# Patient Record
Sex: Female | Born: 1960 | Race: White | Hispanic: No | Marital: Married | Smoking: Former smoker
Health system: Southern US, Community
[De-identification: ages and names within clinical notes are randomized; demographics above are authoritative.]

## PROBLEM LIST (undated history)

## (undated) ENCOUNTER — Emergency Department (HOSPITAL_COMMUNITY): Payer: 59

## (undated) DIAGNOSIS — S93402A Sprain of unspecified ligament of left ankle, initial encounter: Secondary | ICD-10-CM

## (undated) DIAGNOSIS — K635 Polyp of colon: Secondary | ICD-10-CM

## (undated) DIAGNOSIS — R569 Unspecified convulsions: Secondary | ICD-10-CM

## (undated) DIAGNOSIS — C719 Malignant neoplasm of brain, unspecified: Secondary | ICD-10-CM

## (undated) HISTORY — PX: WISDOM TOOTH EXTRACTION: SHX21

## (undated) HISTORY — DX: Malignant neoplasm of brain, unspecified: C71.9

## (undated) HISTORY — PX: TONSILECTOMY/ADENOIDECTOMY WITH MYRINGOTOMY: SHX6125

## (undated) HISTORY — DX: Polyp of colon: K63.5

## (undated) HISTORY — DX: Sprain of unspecified ligament of left ankle, initial encounter: S93.402A

## (undated) HISTORY — PX: FOOT SURGERY: SHX648

## (undated) NOTE — *Deleted (*Deleted)
Has armband been applied?    Does patient have an allergy to IV contrast dye?: No   Has patient ever received premedication for IV contrast dye?:  n/a  Does patient take metformin?: No  If patient does take metformin when was the last dose: n/a  Date of lab work: 08/02/2020 BUN: 11 CR: 0.79 eGFR: >60  IV site:   Has IV site been added to flowsheet?    LMP 06/30/2015

---

## 1990-11-04 LAB — HM MAMMOGRAPHY

## 1998-09-12 ENCOUNTER — Other Ambulatory Visit: Admission: RE | Admit: 1998-09-12 | Discharge: 1998-09-12 | Payer: Self-pay | Admitting: Obstetrics and Gynecology

## 1999-10-05 ENCOUNTER — Other Ambulatory Visit: Admission: RE | Admit: 1999-10-05 | Discharge: 1999-10-05 | Payer: Self-pay | Admitting: *Deleted

## 2000-09-29 ENCOUNTER — Other Ambulatory Visit: Admission: RE | Admit: 2000-09-29 | Discharge: 2000-09-29 | Payer: Self-pay | Admitting: Obstetrics and Gynecology

## 2003-07-26 ENCOUNTER — Other Ambulatory Visit: Admission: RE | Admit: 2003-07-26 | Discharge: 2003-07-26 | Payer: Self-pay | Admitting: Obstetrics and Gynecology

## 2006-10-13 ENCOUNTER — Ambulatory Visit: Payer: Self-pay | Admitting: Internal Medicine

## 2006-10-13 LAB — CONVERTED CEMR LAB
ALT: 11 units/L (ref 0–40)
AST: 19 units/L (ref 0–37)
Albumin: 3.9 g/dL (ref 3.5–5.2)
Alkaline Phosphatase: 35 units/L — ABNORMAL LOW (ref 39–117)
BUN: 10 mg/dL (ref 6–23)
Basophils Absolute: 0 10*3/uL (ref 0.0–0.1)
Basophils Relative: 0.3 % (ref 0.0–1.0)
CO2: 30 meq/L (ref 19–32)
Calcium: 9.2 mg/dL (ref 8.4–10.5)
Chloride: 105 meq/L (ref 96–112)
Chol/HDL Ratio, serum: 2.4
Cholesterol: 170 mg/dL (ref 0–200)
Creatinine, Ser: 0.8 mg/dL (ref 0.4–1.2)
Eosinophil percent: 1 % (ref 0.0–5.0)
GFR calc non Af Amer: 82 mL/min
Glomerular Filtration Rate, Af Am: 100 mL/min/{1.73_m2}
Glucose, Bld: 85 mg/dL (ref 70–99)
HCT: 40.9 % (ref 36.0–46.0)
HDL: 72.1 mg/dL (ref >39.0)
Hemoglobin: 13.7 g/dL (ref 12.0–15.0)
LDL Cholesterol: 89 mg/dL (ref 0–99)
Lymphocytes Relative: 27.7 % (ref 12.0–46.0)
MCHC: 33.5 g/dL (ref 30.0–36.0)
MCV: 95.2 fL (ref 78.0–100.0)
Monocytes Absolute: 0.5 10*3/uL (ref 0.2–0.7)
Monocytes Relative: 10.4 % (ref 3.0–11.0)
Neutro Abs: 3.1 10*3/uL (ref 1.4–7.7)
Neutrophils Relative %: 60.6 % (ref 43.0–77.0)
Platelets: 212 10*3/uL (ref 150–400)
Potassium: 4 meq/L (ref 3.5–5.1)
RBC: 4.29 M/uL (ref 3.87–5.11)
RDW: 12.1 % (ref 11.5–14.6)
Sodium: 140 meq/L (ref 135–145)
TSH: 1.03 microintl units/mL (ref 0.35–5.50)
Total Bilirubin: 1.1 mg/dL (ref 0.3–1.2)
Total Protein: 6.6 g/dL (ref 6.0–8.3)
Triglyceride fasting, serum: 44 mg/dL (ref 0–149)
VLDL: 9 mg/dL (ref 0–40)
WBC: 5 10*3/uL (ref 4.5–10.5)

## 2011-10-07 ENCOUNTER — Ambulatory Visit (INDEPENDENT_AMBULATORY_CARE_PROVIDER_SITE_OTHER): Payer: 59 | Admitting: Family Medicine

## 2011-10-07 ENCOUNTER — Encounter: Payer: Self-pay | Admitting: Family Medicine

## 2011-10-07 ENCOUNTER — Ambulatory Visit (HOSPITAL_BASED_OUTPATIENT_CLINIC_OR_DEPARTMENT_OTHER)
Admission: RE | Admit: 2011-10-07 | Discharge: 2011-10-07 | Disposition: A | Discharge status: Home / Self Care | Payer: 59 | Source: Ambulatory Visit | Attending: Family Medicine | Admitting: Family Medicine

## 2011-10-07 VITALS — BP 166/91 | HR 61 | Temp 98.0°F | Ht 60.0 in | Wt 145.1 lb

## 2011-10-07 DIAGNOSIS — M25579 Pain in unspecified ankle and joints of unspecified foot: Secondary | ICD-10-CM

## 2011-10-07 DIAGNOSIS — R609 Edema, unspecified: Secondary | ICD-10-CM | POA: Insufficient documentation

## 2011-10-07 DIAGNOSIS — S93402A Sprain of unspecified ligament of left ankle, initial encounter: Secondary | ICD-10-CM | POA: Insufficient documentation

## 2011-10-07 DIAGNOSIS — M25572 Pain in left ankle and joints of left foot: Secondary | ICD-10-CM

## 2011-10-07 DIAGNOSIS — M7989 Other specified soft tissue disorders: Secondary | ICD-10-CM

## 2011-10-07 DIAGNOSIS — S93409A Sprain of unspecified ligament of unspecified ankle, initial encounter: Secondary | ICD-10-CM

## 2011-10-07 DIAGNOSIS — X500XXA Overexertion from strenuous movement or load, initial encounter: Secondary | ICD-10-CM

## 2011-10-07 HISTORY — DX: Sprain of unspecified ligament of left ankle, initial encounter: S93.402A

## 2011-10-07 MED ORDER — MISC. DEVICES MISC: Status: DC

## 2011-10-07 NOTE — Assessment & Plan Note (Signed)
Pain and swelling noted over lateral and medial malleolus to the base of the foot. Point tenderness noted at the base of the fibula as well as over the medial malleolus. Neurovascular flow is intact to the foot. We will place her in a mobility boot she will elevate her foot above her heart for 2 minutes a time 3 times a day started on anti-inflammatories and wrapped in an Ace wrap. If symptoms do not improve her x-rays suggest the possibility of a small avulsion fracture at the base of the fibula and she may need further referral. At this time she would prefer to proceed with conservative measures and to proceed with ortho only if symptoms do not improve

## 2011-10-07 NOTE — Progress Notes (Signed)
Hannah [name] [id number] 05-23-1961 10/07/2011      Progress Note-Follow Up  Subjective  Chief Complaint  Chief Complaint  Patient presents with  . Ankle Injury    X 2 days- left ankle swollen- while running    HPI  This is a 50 year old Caucasian female who is in today with a 24-hour history of left ankle pain and swelling. She has not previously seen in this practice but has no significant past medical history. She was running yesterday when she suffered an inversion injury of her left ankle resulting in a fall. She denies any history of similar injury. She's been icing the foot and did take one dose of aspirin but continues to have swelling and ecchymosis. Pain is worse over the medial aspect of the foot and worse with weightbearing. No numbness or tingling noted into the foot. No calf pain no chest pain, shortness of breath or other signs of acute illness.  Past Medical History  Diagnosis Date  . Left ankle sprain 10/07/2011    History reviewed. No pertinent past surgical history.  History reviewed. No pertinent family history.  History   Social History  . Marital Status: Married    Spouse Name: N/A    Number of Children: N/A  . Years of Education: N/A   Occupational History  . Not on file.   Social History Main Topics  . Smoking status: Former Smoker -- 2 years    Types: Cigarettes    Quit date: 11/05/1983  . Smokeless tobacco: Never Used   Comment: 1/2 pack a week  . Alcohol Use: Yes     1 glass of wine weekly  . Drug Use: No  . Sexually Active: Not on file   Other Topics Concern  . Not on file   Social History Narrative  . No narrative on file    No current outpatient prescriptions on file prior to visit.    No Known Allergies  Review of Systems  Review of Systems  Constitutional: Negative for fever and malaise/fatigue.  HENT: Negative for congestion.   Eyes: Negative for discharge.  Respiratory: Negative for shortness of breath.     Cardiovascular: Negative for chest pain, palpitations and leg swelling.  Gastrointestinal: Negative for nausea, abdominal pain and diarrhea.  Genitourinary: Negative for dysuria.  Musculoskeletal: Positive for joint pain and falls.       Left ankle and left foot pain and swelling s/p an inversion injury while running yesterday  Skin: Negative for rash.  Neurological: Negative for loss of consciousness and headaches.  Endo/Heme/Allergies: Negative for polydipsia.  Psychiatric/Behavioral: Negative for depression and suicidal ideas. The patient is not nervous/anxious and does not have insomnia.     Objective  BP 166/91  Pulse 61  Temp(Src) 98 F (36.7 C) (Oral)  Ht 5' (1.524 m)  Wt 145 lb 1.9 oz (65.826 kg)  BMI 28.34 kg/m2  SpO2 99%  LMP 09/14/2011  Physical Exam  Physical Exam  Constitutional: She is oriented to person, place, and time and well-developed, well-nourished, and in no distress. No distress.  HENT:  Head: Normocephalic and atraumatic.  Eyes: Conjunctivae are normal.  Neck: Neck supple. No thyromegaly present.  Cardiovascular: Normal rate, regular rhythm and normal heart sounds.   No murmur heard. Pulmonary/Chest: Effort normal and breath sounds normal. She has no wheezes.  Abdominal: She exhibits no distension and no mass.  Musculoskeletal: She exhibits edema and tenderness.       Point tender over the lateral aspect  of distal fibula as well as over medial malleolus. Decreased range of motion secondary to swelling and pain. Swelling and ecchymosis noted over base of foot top and bottom as well as over the medial and lateral malleolus. Medial is less swollen and lateral. Good blood flow to the foot and sensation is intact in the toes.  Lymphadenopathy:    She has no cervical adenopathy.  Neurological: She is alert and oriented to person, place, and time.  Skin: Skin is warm and dry. No rash noted. She is not diaphoretic.  Psychiatric: Memory, affect and judgment  normal.    Lab Results  Component Value Date   TSH 1.03 10/13/2006   Lab Results  Component Value Date   WBC 5.0 10/13/2006   HGB 13.7 10/13/2006   HCT 40.9 10/13/2006   MCV 95.2 10/13/2006   PLT 212 10/13/2006   Lab Results  Component Value Date   CREATININE 0.8 10/13/2006   BUN 10 10/13/2006   NA 140 10/13/2006   K 4.0 10/13/2006   CL 105 10/13/2006   CO2 30 10/13/2006   Lab Results  Component Value Date   ALT 11 10/13/2006   AST 19 10/13/2006   ALKPHOS 35* 10/13/2006   BILITOT 1.1 10/13/2006   Lab Results  Component Value Date   CHOL 170 10/13/2006   Lab Results  Component Value Date   HDL 72.1 10/13/2006   Lab Results  Component Value Date   LDLCALC 89 10/13/2006     Assessment & Plan  Left ankle sprain Pain and swelling noted over lateral and medial malleolus to the base of the foot. Point tenderness noted at the base of the fibula as well as over the medial malleolus. Neurovascular flow is intact to the foot. We will place her in a mobility boot she will elevate her foot above her heart for 2 minutes a time 3 times a day started on anti-inflammatories and wrapped in an Ace wrap. If symptoms do not improve her x-rays suggest the possibility of a small avulsion fracture at the base of the fibula and she may need further referral. At this time she would prefer to proceed with conservative measures and to proceed with ortho only if symptoms do not improve

## 2012-12-02 ENCOUNTER — Other Ambulatory Visit: Payer: Self-pay | Admitting: Family Medicine

## 2012-12-02 ENCOUNTER — Telehealth: Payer: Self-pay

## 2012-12-02 NOTE — Telephone Encounter (Signed)
Patient fell in her driveway last night and twisted her left knee. Pt would like to have a knee brace.   Per MD apply knee brace.

## 2012-12-02 NOTE — Telephone Encounter (Signed)
Placed brace on patient, warned to consider further work up if does not improve, ice for 10 minutes as often as tolerated

## 2013-01-26 ENCOUNTER — Ambulatory Visit (INDEPENDENT_AMBULATORY_CARE_PROVIDER_SITE_OTHER): Payer: 59 | Admitting: Family Medicine

## 2013-01-26 DIAGNOSIS — L989 Disorder of the skin and subcutaneous tissue, unspecified: Secondary | ICD-10-CM

## 2013-01-27 NOTE — Progress Notes (Signed)
S.  52 y/o WF presents requesting removal of skin lesion on left side of face. Lesion is new in the last few months, is crusty but with distinct circular shape and well demarcated borders, flesh colored. She denies hx of skin cancer.  O. Well appearing.  Skin on left side of face shows a light brown, circular papule with slightly flaky texture and "stuck-on" appearance.  A.  Facial skin lesion, benign-appearing. Discussed removal options with pt and decided on shave excision. After injecting 1/4 cc of 2% lidocaine w/out epi into the area of the lesion for local anesthesia, I used a flexible shave blade to excise the lesion down to the surface of the skin.  Minimal bleeding, hemostasis obtained with gentle pressure.  Dressing applied.    No immediate complications.  Pt tolerated procedure well.  Specimen sent to pathology. F/u prn.

## 2013-03-10 ENCOUNTER — Encounter: Payer: Self-pay | Admitting: Nurse Practitioner

## 2013-03-10 ENCOUNTER — Ambulatory Visit (INDEPENDENT_AMBULATORY_CARE_PROVIDER_SITE_OTHER): Payer: 59 | Admitting: Nurse Practitioner

## 2013-03-10 ENCOUNTER — Ambulatory Visit (HOSPITAL_BASED_OUTPATIENT_CLINIC_OR_DEPARTMENT_OTHER)
Admission: RE | Admit: 2013-03-10 | Discharge: 2013-03-10 | Disposition: A | Discharge status: Home / Self Care | Payer: 59 | Source: Ambulatory Visit | Attending: Nurse Practitioner | Admitting: Nurse Practitioner

## 2013-03-10 VITALS — BP 110/70 | HR 56 | Temp 97.6°F | Ht 65.0 in | Wt 144.2 lb

## 2013-03-10 DIAGNOSIS — W010XXA Fall on same level from slipping, tripping and stumbling without subsequent striking against object, initial encounter: Secondary | ICD-10-CM

## 2013-03-10 DIAGNOSIS — R079 Chest pain, unspecified: Secondary | ICD-10-CM

## 2013-03-10 DIAGNOSIS — W19XXXA Unspecified fall, initial encounter: Secondary | ICD-10-CM | POA: Insufficient documentation

## 2013-03-10 DIAGNOSIS — R0781 Pleurodynia: Secondary | ICD-10-CM

## 2013-03-10 DIAGNOSIS — S2239XA Fracture of one rib, unspecified side, initial encounter for closed fracture: Secondary | ICD-10-CM | POA: Insufficient documentation

## 2013-03-10 DIAGNOSIS — Y9302 Activity, running: Secondary | ICD-10-CM | POA: Insufficient documentation

## 2013-03-10 MED ORDER — IBUPROFEN 200 MG PO TABS: 200.0000 mg | ORAL_TABLET | Freq: Four times a day (QID) | ORAL | Status: DC | PRN

## 2013-03-10 MED ORDER — HYDROCODONE-ACETAMINOPHEN 5-325 MG PO TABS: 1.0000 | ORAL_TABLET | Freq: Every evening | ORAL | Status: DC | PRN

## 2013-03-10 NOTE — Patient Instructions (Signed)
You likely have a rib fracture. I am doubtful you have nicked any vital organs, but we will get a urinalysis if fracture is confirmed on xray. Meanwhile take ibuprophen 2-4 tabs every 8 hours for a few days so you will take deep breaths during day. OK to use hydrocodone at night. Feel better!

## 2013-03-10 NOTE — Progress Notes (Signed)
  Subjective:    Hannah [name] is a 52 y.o. female who presents for evaluation of chest wall pain. Onset was 4 days ago. Symptoms have been worsening since that time. The patient describes the pain as aching in the lateral chest wall: on the left. Patient rates pain as a 7/10 in intensity. Associated symptoms are: none. Aggravating factors are: deep inspiration, twisting trunk, lying down. Alleviating factors are: position change and rest. Mechanism of injury: fell while running. Previous visits for this problem: none. Evaluation to date: none. Treatment to date: OTC analgesics: aleve 2 tabs.  The following portions of the patient's history were reviewed and updated as appropriate: allergies, current medications, past medical history, past social history, past surgical history and problem list.  Review of Systems Constitutional: negative for fevers Respiratory: positive for pain with deep inspiration and lying down, negative for cough, hemoptysis and wheezing Cardiovascular: negative for irregular heart beat Gastrointestinal: negative for abdominal pain, nausea and no visible blood in urine Musculoskeletal:positive for pain under L breast and posterior lateral chest, aggravated by position change & deep breath , negative for muscle weakness, neck pain and swelling   Objective:    BP 110/70  Pulse 56  Temp(Src) 97.6 F (36.4 C) (Oral)  Ht 5\' 5"  (1.651 m)  Wt 144 lb 4 oz (65.431 kg)  BMI 24 kg/m2  SpO2 99%  LMP 01/13/2013 General appearance: alert, cooperative and no distress Head: Normocephalic, without obvious abnormality, atraumatic Back: symmetric, no curvature. ROM normal. No CVA tenderness., twisting & lateral ROM painful, tender to palaption posterior L lateral chest Lungs: clear to auscultation bilaterally and reports pain with deep inspiration Heart: regular rate and rhythm, S1, S2 normal, no murmur, click, rub or gallop Abdomen: soft, non-tender; bowel sounds normal; no  masses,  no organomegaly Extremities: Ecchymoses L elbow, FROM, tender at lateral epicondyle. healing abrasion at L knee, no signoficant swelling, structure stable. Skin: ecchymoses noted L elbow, abrasion L knee  Imaging Chest x-ray: pending review by radiologist   Assessment:    Chest wall injury   Plan:    Chest wall injuries were discussed.  The sometimes prolonged recovery time was stressed. OTC analgesics as needed. Opioid analgesics per medication orders. Follow up as needed Splint ribs with pillow in order to take deep breaths.

## 2013-06-18 ENCOUNTER — Encounter: Payer: Self-pay | Admitting: Nurse Practitioner

## 2013-06-18 ENCOUNTER — Ambulatory Visit (INDEPENDENT_AMBULATORY_CARE_PROVIDER_SITE_OTHER): Payer: 59 | Admitting: Nurse Practitioner

## 2013-06-18 VITALS — BP 92/60 | HR 43 | Temp 98.2°F | Ht 65.0 in | Wt 143.5 lb

## 2013-06-18 DIAGNOSIS — R5381 Other malaise: Secondary | ICD-10-CM

## 2013-06-18 DIAGNOSIS — J029 Acute pharyngitis, unspecified: Secondary | ICD-10-CM

## 2013-06-18 DIAGNOSIS — R5383 Other fatigue: Secondary | ICD-10-CM

## 2013-06-18 LAB — POCT RAPID STREP A (OFFICE): Rapid Strep A Screen: NEGATIVE

## 2013-06-18 NOTE — Progress Notes (Signed)
Subjective:     Alaria A Hy is a 52 y.o. female who presents for evaluation of sore throat. Associated symptoms include headache, sore throat and fatigue. Onset of symptoms was 10 days ago, and have been unchanged since that time. She is drinking plenty of fluids. She has had a recent close exposure to someone with proven streptococcal pharyngitis. Of note, her daughter was treated for mononucleosis 4 months ago.  The following portions of the patient's history were reviewed and updated as appropriate: allergies, current medications, past medical history and problem list.  Review of Systems Constitutional: positive for fatigue Eyes: negative Ears, nose, mouth, throat, and face: positive for sore throat Respiratory: negative for cough, dyspnea on exertion, pleurisy/chest pain, sputum and wheezing Cardiovascular: negative for chest pain and irregular heart beat, slow heart rate-runner Integument/breast: negative for rash Neurological: positive for headaches Allergic/Immunologic: negative    Objective:    BP 92/60  Pulse 43  Temp(Src) 98.2 F (36.8 C) (Oral)  Ht 5\' 5"  (1.651 m)  Wt 143 lb 8 oz (65.091 kg)  BMI 23.88 kg/m2  SpO2 97% General appearance: alert, cooperative, appears stated age and no distress Head: Normocephalic, without obvious abnormality, atraumatic Eyes: negative findings: lids and lashes normal, conjunctivae and sclerae normal, corneas clear and pupils equal, round, reactive to light and accomodation Ears: mild retraction bilateral TM, efusion noted bilaterally Throat: lips, mucosa, and tongue normal; teeth and gums normal and posterior vessels enlarged Lungs: clear to auscultation bilaterally Heart: bradycardia, regular rhythm, no murmur  Abdomen: soft, non-tender; bowel sounds normal; no masses,  no organomegaly Skin: Skin color, texture, turgor normal. No rashes or lesions Lymph nodes: Cervical adenopathy: palpable posterior and anterior cervical nodes,  tender; no suprclavicular LAD  Laboratory Strep test done. Results:negative.    Assessment:    Acute pharyngitis. Will send strep DNA probe Plan:    Will perform CBC, monospot if strep DNA neg & fatigue persists.   Rest, ibuprophen as needed.

## 2013-06-18 NOTE — Patient Instructions (Addendum)
I will call you with results. If positive, I will call in an antibiotic. This may be viral, if so, you should be feeling better soon-most viral illnesses resolve within 10-14 days. If strep DNA test is negative and fatigue persists, I recommend checking monospot & blood count. Feel better!  Sore Throat A sore throat is pain, burning, irritation, or scratchiness of the throat. There is often pain or tenderness when swallowing or talking. A sore throat may be accompanied by other symptoms, such as coughing, sneezing, fever, and swollen neck glands. A sore throat is often the first sign of another sickness, such as a cold, flu, strep throat, or mononucleosis (commonly known as mono). Most sore throats go away without medical treatment. CAUSES  The most common causes of a sore throat include:  A viral infection, such as a cold, flu, or mono.  A bacterial infection, such as strep throat, tonsillitis, or whooping cough.  Seasonal allergies.  Dryness in the air.  Irritants, such as smoke or pollution.  Gastroesophageal reflux disease (GERD). HOME CARE INSTRUCTIONS   Only take over-the-counter medicines as directed by your caregiver.  Drink enough fluids to keep your urine clear or pale yellow.  Rest as needed.  Try using throat sprays, lozenges, or sucking on hard candy to ease any pain (if older than 4 years or as directed).  Sip warm liquids, such as broth, herbal tea, or warm water with honey to relieve pain temporarily. You may also eat or drink cold or frozen liquids such as frozen ice pops.  Gargle with salt water (mix 1 tsp salt with 8 oz of water).  Do not smoke and avoid secondhand smoke.  Put a cool-mist humidifier in your bedroom at night to moisten the air. You can also turn on a hot shower and sit in the bathroom with the door closed for 5 10 minutes. SEEK IMMEDIATE MEDICAL CARE IF:  You have difficulty breathing.  You are unable to swallow fluids, soft foods, or your  saliva.  You have increased swelling in the throat.  Your sore throat does not get better in 7 days.  You have nausea and vomiting.  You have a fever or persistent symptoms for more than 2 3 days.  You have a fever and your symptoms suddenly get worse. MAKE SURE YOU:   Understand these instructions.  Will watch your condition.  Will get help right away if you are not doing well or get worse. Document Released: 11/28/2004 Document Revised: 10/07/2012 Document Reviewed: 06/28/2012 [facility] Patient Information 2014 [place], [place].

## 2013-06-19 LAB — STREP A DNA PROBE: GASP: POSITIVE

## 2013-06-21 ENCOUNTER — Telehealth: Payer: Self-pay | Admitting: Nurse Practitioner

## 2013-06-21 DIAGNOSIS — J020 Streptococcal pharyngitis: Secondary | ICD-10-CM

## 2013-06-21 MED ORDER — PENICILLIN V POTASSIUM 500 MG PO TABS: ORAL_TABLET | ORAL | Status: DC

## 2013-06-21 NOTE — Telephone Encounter (Signed)
GASP pos. Will treat for streptococcal pharyngitis. Pt informed & advised to eat yogurt daily while on antibiotic, spacing ABX & yogurt at least 2 hours apart, advised to change toothbrush after 24 hrs on ABX.

## 2013-08-06 ENCOUNTER — Encounter: Payer: Self-pay | Admitting: Nurse Practitioner

## 2013-08-06 ENCOUNTER — Telehealth (INDEPENDENT_AMBULATORY_CARE_PROVIDER_SITE_OTHER): Payer: 59 | Admitting: Nurse Practitioner

## 2013-08-06 VITALS — BP 102/60 | HR 57 | Temp 97.6°F | Ht 65.0 in | Wt 143.0 lb

## 2013-08-06 DIAGNOSIS — J029 Acute pharyngitis, unspecified: Secondary | ICD-10-CM

## 2013-08-06 LAB — POCT RAPID STREP A (OFFICE): Rapid Strep A Screen: NEGATIVE

## 2013-08-06 NOTE — Progress Notes (Signed)
  Subjective:    Patient ID: Hannah [name], female    DOB: 1961-03-12, 52 y.o.   MRN: [id number]  HPI Comments: Also concerned about spoon-shaped nails. Chronic, started w/1 nail about 15 years ago, now has spread to 3 or 4 fingers.  Sore Throat  This is a chronic problem. The current episode started more than 1 month ago (treated for strep over 1 mo. ago, felt got better, but c/o ongoing throat irritation). The problem has been waxing and waning. Neither side of throat is experiencing more pain than the other. There has been no fever. The patient is experiencing no pain. Associated symptoms include congestion (post-nasal drip). Pertinent negatives include no abdominal pain, coughing, diarrhea, ear discharge, ear pain, headaches, hoarse voice, neck pain, shortness of breath or swollen glands. She has tried nothing for the symptoms.      Review of Systems  Constitutional: Negative for fever, chills, activity change, appetite change and fatigue.  HENT: Positive for congestion (post-nasal drip), sore throat and postnasal drip. Negative for ear pain, hoarse voice, neck pain and ear discharge.   Respiratory: Negative for cough and shortness of breath.   Cardiovascular: Negative for chest pain.  Gastrointestinal: Negative for abdominal pain and diarrhea.  Skin: Negative for rash.  Neurological: Negative for headaches.       Objective:   Physical Exam  Vitals reviewed. Constitutional: She is oriented to person, place, and time. She appears well-developed and well-nourished. No distress.  HENT:  Head: Normocephalic and atraumatic.  Right Ear: External ear normal.  Mouth/Throat: Oropharynx is clear and moist. No oropharyngeal exudate.  Dilated vessels posterior pharynx.  Eyes: Conjunctivae are normal. Right eye exhibits no discharge. Left eye exhibits no discharge.  Neck: No thyromegaly present.  Cardiovascular: Regular rhythm and normal heart sounds.   No murmur heard. bradycardia   Pulmonary/Chest: Effort normal and breath sounds normal. No respiratory distress. She has no wheezes.  Lymphadenopathy:    She has cervical adenopathy (fixed tonsilar palpable nodes, NT, bilateral).  Neurological: She is alert and oriented to person, place, and time.  Skin: Skin is warm and dry. No rash noted.  Several mis-shaped nails-curve downward-opposite spoon-shape.  Psychiatric: She has a normal mood and affect. Her behavior is normal. Thought content normal.          Assessment & Plan:  1. Sore throat Chronic. Had strep 1 mo ago. - POCT rapid strep A - Upper Respiratory Culture

## 2013-08-06 NOTE — Patient Instructions (Addendum)
If your sore throat is not coming from strep infection, it may be irritated from post-nasal drip. Start Lloyd Huger med sinus washes daily. Also you have fluid in both ears, likely from allergies. The sinus rinses will help decrease allergenic load. Also you may start pseudoephedrine 30 mg twice daily for 5-7 days to help dry up fluid. If no improvement, we can try nasal antihistamines and/or steroids. You may use benzocaine throat lozenges for comfort if needed. Please schedule physical for preventive care and to further explore potential nutritional causes for mis-shaped nails.  You will need fasting labs.

## 2013-08-09 ENCOUNTER — Telehealth: Payer: Self-pay | Admitting: Nurse Practitioner

## 2013-08-09 LAB — CULTURE, UPPER RESPIRATORY: Organism ID, Bacteria: NORMAL

## 2013-08-09 NOTE — Telephone Encounter (Signed)
No strep. May be viral or allergy r/t post-nasal drip causing sore throat. Pt should call for re-eval if throat persistently sore in spite of using nasal rinses.

## 2013-08-31 ENCOUNTER — Other Ambulatory Visit: Payer: Self-pay | Admitting: Nurse Practitioner

## 2013-08-31 DIAGNOSIS — Z0000 Encounter for general adult medical examination without abnormal findings: Secondary | ICD-10-CM

## 2013-09-03 ENCOUNTER — Other Ambulatory Visit (INDEPENDENT_AMBULATORY_CARE_PROVIDER_SITE_OTHER): Payer: 59

## 2013-09-03 DIAGNOSIS — Z0000 Encounter for general adult medical examination without abnormal findings: Secondary | ICD-10-CM

## 2013-09-03 LAB — URINALYSIS, ROUTINE W REFLEX MICROSCOPIC
Bilirubin Urine: NEGATIVE
Hgb urine dipstick: NEGATIVE
Ketones, ur: NEGATIVE
Leukocytes, UA: NEGATIVE
Nitrite: NEGATIVE
Specific Gravity, Urine: >=1.03 (ref 1.000–1.030)
Total Protein, Urine: NEGATIVE
Urine Glucose: NEGATIVE
Urobilinogen, UA: 0.2 (ref 0.0–1.0)
pH: 6 (ref 5.0–8.0)

## 2013-09-03 LAB — RENAL FUNCTION PANEL
Albumin: 4.2 g/dL (ref 3.5–5.2)
BUN: 19 mg/dL (ref 6–23)
CO2: 28 mEq/L (ref 19–32)
Calcium: 9.4 mg/dL (ref 8.4–10.5)
Chloride: 103 mEq/L (ref 96–112)
Creatinine, Ser: 0.9 mg/dL (ref 0.4–1.2)
GFR: 72.49 mL/min (ref >60.00)
Glucose, Bld: 82 mg/dL (ref 70–99)
Phosphorus: 4 mg/dL (ref 2.3–4.6)
Potassium: 4.7 mEq/L (ref 3.5–5.1)
Sodium: 139 mEq/L (ref 135–145)

## 2013-09-03 LAB — HEPATIC FUNCTION PANEL
ALT: 14 U/L (ref 0–35)
AST: 20 U/L (ref 0–37)
Albumin: 4.2 g/dL (ref 3.5–5.2)
Alkaline Phosphatase: 46 U/L (ref 39–117)
Bilirubin, Direct: 0.1 mg/dL (ref 0.0–0.3)
Total Bilirubin: 0.9 mg/dL (ref 0.3–1.2)
Total Protein: 6.7 g/dL (ref 6.0–8.3)

## 2013-09-03 LAB — TSH: TSH: 0.92 u[IU]/mL (ref 0.35–5.50)

## 2013-09-03 LAB — CBC
HCT: 39.4 % (ref 36.0–46.0)
Hemoglobin: 13.4 g/dL (ref 12.0–15.0)
MCHC: 34 g/dL (ref 30.0–36.0)
MCV: 94.1 fl (ref 78.0–100.0)
Platelets: 214 10*3/uL (ref 150.0–400.0)
RBC: 4.19 Mil/uL (ref 3.87–5.11)
RDW: 12.9 % (ref 11.5–14.6)
WBC: 4.4 10*3/uL — ABNORMAL LOW (ref 4.5–10.5)

## 2013-09-03 LAB — LIPID PANEL
Cholesterol: 195 mg/dL (ref 0–200)
HDL: 96.2 mg/dL (ref >39.00)
LDL Cholesterol: 92 mg/dL (ref 0–99)
Total CHOL/HDL Ratio: 2
Triglycerides: 36 mg/dL (ref 0.0–149.0)
VLDL: 7.2 mg/dL (ref 0.0–40.0)

## 2013-09-03 LAB — HIV ANTIBODY (ROUTINE TESTING W REFLEX): HIV: NONREACTIVE

## 2013-09-03 NOTE — Progress Notes (Signed)
Labs only

## 2013-09-04 LAB — VITAMIN D 25 HYDROXY (VIT D DEFICIENCY, FRACTURES): Vit D, 25-Hydroxy: 35 ng/mL (ref 30–89)

## 2013-09-06 ENCOUNTER — Telehealth: Payer: Self-pay | Admitting: Nurse Practitioner

## 2013-09-06 DIAGNOSIS — D72819 Decreased white blood cell count, unspecified: Secondary | ICD-10-CM

## 2013-09-06 DIAGNOSIS — E559 Vitamin D deficiency, unspecified: Secondary | ICD-10-CM

## 2013-09-06 NOTE — Telephone Encounter (Signed)
Message copied by Cindra Presume on Mon Sep 06, 2013  2:47 PM ------      Message from: [place], [place] [facility]      Created: Sun Sep 05, 2013  7:39 AM       Discuss labs & prev screenings ------

## 2013-09-09 ENCOUNTER — Other Ambulatory Visit: Payer: Self-pay

## 2014-08-19 ENCOUNTER — Other Ambulatory Visit: Payer: Self-pay

## 2014-11-02 ENCOUNTER — Encounter: Payer: Self-pay | Admitting: Nurse Practitioner

## 2014-11-02 ENCOUNTER — Ambulatory Visit (INDEPENDENT_AMBULATORY_CARE_PROVIDER_SITE_OTHER): Payer: 59 | Admitting: Nurse Practitioner

## 2014-11-02 VITALS — BP 152/90 | HR 48 | Temp 97.4°F | Ht 65.0 in | Wt 148.0 lb

## 2014-11-02 DIAGNOSIS — IMO0001 Reserved for inherently not codable concepts without codable children: Secondary | ICD-10-CM

## 2014-11-02 DIAGNOSIS — H6593 Unspecified nonsuppurative otitis media, bilateral: Secondary | ICD-10-CM

## 2014-11-02 DIAGNOSIS — R42 Dizziness and giddiness: Secondary | ICD-10-CM

## 2014-11-02 DIAGNOSIS — R030 Elevated blood-pressure reading, without diagnosis of hypertension: Secondary | ICD-10-CM

## 2014-11-02 LAB — CBC WITH DIFFERENTIAL/PLATELET
Basophils Absolute: 0 10*3/uL (ref 0.0–0.1)
Basophils Relative: 0.4 % (ref 0.0–3.0)
Eosinophils Absolute: 0 10*3/uL (ref 0.0–0.7)
Eosinophils Relative: 0.9 % (ref 0.0–5.0)
HCT: 42.8 % (ref 36.0–46.0)
Hemoglobin: 14.3 g/dL (ref 12.0–15.0)
Lymphocytes Relative: 34.6 % (ref 12.0–46.0)
Lymphs Abs: 1.6 10*3/uL (ref 0.7–4.0)
MCHC: 33.3 g/dL (ref 30.0–36.0)
MCV: 94.3 fl (ref 78.0–100.0)
Monocytes Absolute: 0.5 10*3/uL (ref 0.1–1.0)
Monocytes Relative: 10.4 % (ref 3.0–12.0)
Neutro Abs: 2.5 10*3/uL (ref 1.4–7.7)
Neutrophils Relative %: 53.7 % (ref 43.0–77.0)
Platelets: 226 10*3/uL (ref 150.0–400.0)
RBC: 4.54 Mil/uL (ref 3.87–5.11)
RDW: 12.4 % (ref 11.5–15.5)
WBC: 4.7 10*3/uL (ref 4.0–10.5)

## 2014-11-02 LAB — COMPREHENSIVE METABOLIC PANEL
ALT: 13 U/L (ref 0–35)
AST: 18 U/L (ref 0–37)
Albumin: 4.4 g/dL (ref 3.5–5.2)
Alkaline Phosphatase: 53 U/L (ref 39–117)
BUN: 12 mg/dL (ref 6–23)
CO2: 29 mEq/L (ref 19–32)
Calcium: 9.6 mg/dL (ref 8.4–10.5)
Chloride: 104 mEq/L (ref 96–112)
Creatinine, Ser: 0.7 mg/dL (ref 0.4–1.2)
GFR: 92.75 mL/min (ref >60.00)
Glucose, Bld: 83 mg/dL (ref 70–99)
Potassium: 4.1 mEq/L (ref 3.5–5.1)
Sodium: 141 mEq/L (ref 135–145)
Total Bilirubin: 0.8 mg/dL (ref 0.2–1.2)
Total Protein: 7 g/dL (ref 6.0–8.3)

## 2014-11-02 LAB — HEMOGLOBIN A1C: Hgb A1c MFr Bld: 5.5 % (ref 4.6–6.5)

## 2014-11-02 LAB — MICROALBUMIN / CREATININE URINE RATIO
Creatinine,U: 73.3 mg/dL
Microalb Creat Ratio: 0.4 mg/g (ref 0.0–30.0)
Microalb, Ur: 0.3 mg/dL (ref 0.0–1.9)

## 2014-11-02 LAB — TSH: TSH: 1.23 u[IU]/mL (ref 0.35–4.50)

## 2014-11-02 MED ORDER — FLUTICASONE PROPIONATE 50 MCG/ACT NA SUSP: 1.0000 | Freq: Two times a day (BID) | NASAL | Status: DC

## 2014-11-02 NOTE — Progress Notes (Signed)
Pre visit review using our clinic review tool, if applicable. No additional management support is needed unless otherwise documented below in the visit note. 

## 2014-11-02 NOTE — Progress Notes (Signed)
   Subjective:    Patient ID: Hannah [name], female    DOB: [birth year], 53 y.o.   MRN: [id number]  Dizziness This is a new problem. The current episode started in the past 7 days (5d). The problem occurs constantly. The problem has been gradually improving. Associated symptoms include anorexia, headaches, nausea, a sore throat, vertigo and vomiting. Pertinent negatives include no abdominal pain, chest pain, chills, congestion, coughing, fatigue, fever or weakness. The symptoms are aggravated by bending and twisting (rolling to r side). Treatments tried: emitrol for nausea. Epley's maneuver performed several times. The treatment provided significant (relieved nausea) relief.      Review of Systems  Constitutional: Negative for fever, chills and fatigue.  HENT: Positive for sore throat. Negative for congestion and voice change.   Respiratory: Negative for cough and shortness of breath.   Cardiovascular: Negative for chest pain.  Gastrointestinal: Positive for nausea, vomiting and anorexia. Negative for abdominal pain and diarrhea.  Neurological: Positive for dizziness, vertigo and headaches. Negative for weakness.       Objective:   Physical Exam  Constitutional: She is oriented to person, place, and time. She appears well-developed and well-nourished.  HENT:  Head: Normocephalic and atraumatic.  Right Ear: External ear normal.  Left Ear: External ear normal.  Mouth/Throat: Oropharynx is clear and moist. No oropharyngeal exudate.  Cloudy fluid R TM, bones partially visible, no erythema. Clear fluid L TM.  Eyes: Conjunctivae and EOM are normal. Pupils are equal, round, and reactive to light. Right eye exhibits no discharge. Left eye exhibits no discharge.  Neck: Normal range of motion. Neck supple. No thyromegaly present.  Cardiovascular: Normal rate, regular rhythm and normal heart sounds.   No murmur heard. Pulmonary/Chest: Effort normal and breath sounds normal. No respiratory  distress. She has no wheezes. She has no rales.  Lymphadenopathy:    She has no cervical adenopathy.  Neurological: She is alert and oriented to person, place, and time.  Skin: Skin is warm and dry.  Psychiatric: She has a normal mood and affect. Her behavior is normal. Thought content normal.  Vitals reviewed.         Assessment & Plan:  1. Dizzy DD: BPPV, post-viral neuritis, HTN, thyroid disease, anemia - CBC with Differential - Comprehensive metabolic panel - TSH - Hemoglobin A1c  2. Elevated blood pressure - Microalbumin / creatinine urine ratio Already has healthy habits. Recheck in 1 week  3. Otitis media with effusion, bilateral - fluticasone (FLONASE) 50 MCG/ACT nasal spray; Place 1 spray into both nostrils 2 (two) times daily.  Dispense: 16 g; Refill: 6  F/u 1 wk

## 2014-11-02 NOTE — Patient Instructions (Addendum)
I think you have had BPPV, given that dizziness is resolving.  Start flonase as directed to help with eustacian tube function. Return in 1 week for BP check. My office will call with lab results.   Benign Positional Vertigo Vertigo means you feel like you or your surroundings are moving when they are not. Benign positional vertigo is the most common form of vertigo. Benign means that the cause of your condition is not serious. Benign positional vertigo is more common in older adults. CAUSES  Benign positional vertigo is the result of an upset in the labyrinth system. This is an area in the middle ear that helps control your balance. This may be caused by a viral infection, head injury, or repetitive motion. However, often no specific cause is found. SYMPTOMS  Symptoms of benign positional vertigo occur when you move your head or eyes in different directions. Some of the symptoms may include:  Loss of balance and falls.  Vomiting.  Blurred vision.  Dizziness.  Nausea.  Involuntary eye movements (nystagmus). DIAGNOSIS  Benign positional vertigo is usually diagnosed by physical exam. If the specific cause of your benign positional vertigo is unknown, your caregiver may perform imaging tests, such as magnetic resonance imaging (MRI) or computed tomography (CT). TREATMENT  Your caregiver may recommend movements or procedures to correct the benign positional vertigo. Medicines such as meclizine, benzodiazepines, and medicines for nausea may be used to treat your symptoms. In rare cases, if your symptoms are caused by certain conditions that affect the inner ear, you may need surgery. HOME CARE INSTRUCTIONS   Follow your caregiver's instructions.  Move slowly. Do not make sudden body or head movements.  Avoid driving.  Avoid operating heavy machinery.  Avoid performing any tasks that would be dangerous to you or others during a vertigo episode.  Drink enough fluids to keep your urine  clear or pale yellow. SEEK IMMEDIATE MEDICAL CARE IF:   You develop problems with walking, weakness, numbness, or using your arms, hands, or legs.  You have difficulty speaking.  You develop severe headaches.  Your nausea or vomiting continues or gets worse.  You develop visual changes.  Your family or friends notice any behavioral changes.  Your condition gets worse.  You have a fever.  You develop a stiff neck or sensitivity to light. MAKE SURE YOU:   Understand these instructions.  Will watch your condition.  Will get help right away if you are not doing well or get worse. Document Released: 07/29/2006 Document Revised: 01/13/2012 Document Reviewed: 07/11/2011 [facility] Patient Information 2015 [place], [place]. This information is not intended to replace advice given to you by your health care provider. Make sure you discuss any questions you have with your health care provider.

## 2014-11-03 ENCOUNTER — Telehealth: Payer: Self-pay | Admitting: Nurse Practitioner

## 2014-11-03 NOTE — Telephone Encounter (Signed)
Noted  

## 2014-11-03 NOTE — Telephone Encounter (Signed)
Labs reveal no cause for dizzy episodes All labs nml Discussed w/pt. All questions answered.

## 2015-02-24 ENCOUNTER — Other Ambulatory Visit: Payer: Self-pay | Admitting: Nurse Practitioner

## 2015-02-24 DIAGNOSIS — Z0000 Encounter for general adult medical examination without abnormal findings: Secondary | ICD-10-CM

## 2015-02-24 DIAGNOSIS — M79676 Pain in unspecified toe(s): Secondary | ICD-10-CM

## 2015-02-27 ENCOUNTER — Other Ambulatory Visit (INDEPENDENT_AMBULATORY_CARE_PROVIDER_SITE_OTHER): Payer: 59

## 2015-02-27 DIAGNOSIS — Z0000 Encounter for general adult medical examination without abnormal findings: Secondary | ICD-10-CM

## 2015-02-27 DIAGNOSIS — M79676 Pain in unspecified toe(s): Secondary | ICD-10-CM | POA: Diagnosis not present

## 2015-02-27 LAB — CBC WITH DIFFERENTIAL/PLATELET
Basophils Absolute: 0 10*3/uL (ref 0.0–0.1)
Basophils Relative: 0.3 % (ref 0.0–3.0)
Eosinophils Absolute: 0 10*3/uL (ref 0.0–0.7)
Eosinophils Relative: 0.3 % (ref 0.0–5.0)
HCT: 42 % (ref 36.0–46.0)
Hemoglobin: 14.3 g/dL (ref 12.0–15.0)
Lymphocytes Relative: 24.5 % (ref 12.0–46.0)
Lymphs Abs: 1.4 10*3/uL (ref 0.7–4.0)
MCHC: 33.9 g/dL (ref 30.0–36.0)
MCV: 92.7 fl (ref 78.0–100.0)
Monocytes Absolute: 0.5 10*3/uL (ref 0.1–1.0)
Monocytes Relative: 8.3 % (ref 3.0–12.0)
Neutro Abs: 3.8 10*3/uL (ref 1.4–7.7)
Neutrophils Relative %: 66.6 % (ref 43.0–77.0)
Platelets: 216 10*3/uL (ref 150.0–400.0)
RBC: 4.54 Mil/uL (ref 3.87–5.11)
RDW: 12.5 % (ref 11.5–15.5)
WBC: 5.7 10*3/uL (ref 4.0–10.5)

## 2015-02-27 LAB — COMPREHENSIVE METABOLIC PANEL
ALT: 12 U/L (ref 0–35)
AST: 18 U/L (ref 0–37)
Albumin: 4.5 g/dL (ref 3.5–5.2)
Alkaline Phosphatase: 61 U/L (ref 39–117)
BUN: 18 mg/dL (ref 6–23)
CO2: 31 mEq/L (ref 19–32)
Calcium: 9.8 mg/dL (ref 8.4–10.5)
Chloride: 105 mEq/L (ref 96–112)
Creatinine, Ser: 0.88 mg/dL (ref 0.40–1.20)
GFR: 71.14 mL/min (ref >60.00)
Glucose, Bld: 91 mg/dL (ref 70–99)
Potassium: 4.4 mEq/L (ref 3.5–5.1)
Sodium: 141 mEq/L (ref 135–145)
Total Bilirubin: 0.7 mg/dL (ref 0.2–1.2)
Total Protein: 7 g/dL (ref 6.0–8.3)

## 2015-02-27 LAB — LIPID PANEL
Cholesterol: 207 mg/dL — ABNORMAL HIGH (ref 0–200)
HDL: 90.6 mg/dL (ref >39.00)
LDL Cholesterol: 109 mg/dL — ABNORMAL HIGH (ref 0–99)
NonHDL: 116.4
Total CHOL/HDL Ratio: 2
Triglycerides: 36 mg/dL (ref 0.0–149.0)
VLDL: 7.2 mg/dL (ref 0.0–40.0)

## 2015-02-27 LAB — SEDIMENTATION RATE: Sed Rate: 11 mm/hr (ref 0–22)

## 2015-02-27 LAB — URIC ACID: Uric Acid, Serum: 5.5 mg/dL (ref 2.4–7.0)

## 2015-02-27 LAB — HEMOGLOBIN A1C: Hgb A1c MFr Bld: 5.3 % (ref 4.6–6.5)

## 2015-02-28 LAB — CYCLIC CITRUL PEPTIDE ANTIBODY, IGG: Cyclic Citrullin Peptide Ab: <2 U/mL (ref 0.0–5.0)

## 2015-02-28 LAB — VITAMIN D 25 HYDROXY (VIT D DEFICIENCY, FRACTURES): VITD: 26.58 ng/mL — ABNORMAL LOW (ref 30.00–100.00)

## 2015-03-10 ENCOUNTER — Ambulatory Visit (INDEPENDENT_AMBULATORY_CARE_PROVIDER_SITE_OTHER): Payer: 59 | Admitting: Nurse Practitioner

## 2015-03-10 ENCOUNTER — Encounter: Payer: Self-pay | Admitting: Nurse Practitioner

## 2015-03-10 VITALS — BP 116/64 | HR 51 | Temp 97.5°F | Resp 12 | Ht 65.0 in | Wt 149.0 lb

## 2015-03-10 DIAGNOSIS — M79674 Pain in right toe(s): Secondary | ICD-10-CM

## 2015-03-10 DIAGNOSIS — H6590 Unspecified nonsuppurative otitis media, unspecified ear: Secondary | ICD-10-CM | POA: Insufficient documentation

## 2015-03-10 DIAGNOSIS — H6591 Unspecified nonsuppurative otitis media, right ear: Secondary | ICD-10-CM | POA: Diagnosis not present

## 2015-03-10 DIAGNOSIS — Z1211 Encounter for screening for malignant neoplasm of colon: Secondary | ICD-10-CM

## 2015-03-10 DIAGNOSIS — Z0000 Encounter for general adult medical examination without abnormal findings: Secondary | ICD-10-CM | POA: Diagnosis not present

## 2015-03-10 DIAGNOSIS — E559 Vitamin D deficiency, unspecified: Secondary | ICD-10-CM | POA: Diagnosis not present

## 2015-03-10 MED ORDER — VITAMIN D3 1.25 MG (50000 UT) PO CAPS: 1.0000 | ORAL_CAPSULE | ORAL | Status: DC

## 2015-03-10 NOTE — Patient Instructions (Signed)
Start vitamin as directed. Have level checked again in 12 weeks.  Continue healthy diet & exercise!!!  Get colonoscopy, Mammogram, & Pap smear.

## 2015-03-10 NOTE — Progress Notes (Signed)
Subjective:     Hannah [name] is a 54 y.o. female and is here for a comprehensive physical exam. The patient reports problems - R toe pain. Prev care-due for mmg & PAp-cannot remember when last exams were done. Plans to see gyn for these tests. Never had colonoscopy. Father had colon ca age 46. She denies symptoms. Eats more than 5 servings fruits & veggies qd, whole grains. Discussed importance of early diagnosis. Exercises regularly. Labs reviewed: all nml except vit d def. Discussed D goal of 50-80 & disease associations of low d. R great Toe started hurting 1 mo ago after running marathon. Started at medial nail edge, now hurts in MTP joint. Denies swelling, erythema, warmth. History   Social History  . Marital Status: Married    Spouse Name: N/A  . Number of Children: 3  . Years of Education: N/A   Occupational History  . Not on file.   Social History Main Topics  . Smoking status: Former Smoker -- 2 years    Types: Cigarettes    Quit date: 11/05/1983  . Smokeless tobacco: Never Used     Comment: 1/2 pack a week  . Alcohol Use: Yes     Comment: 1 glass of wine weekly  . Drug Use: No  . Sexual Activity: Yes    Birth Control/ Protection: Surgical   Other Topics Concern  . Not on file   Social History Narrative   Health Maintenance  Topic Date Due  . MAMMOGRAM  03/09/2016 (Originally 01/14/2011)  . PAP SMEAR  03/09/2016 (Originally 03/09/2013)  . INFLUENZA VACCINE  06/05/2015  . TETANUS/TDAP  10/05/2019  . COLONOSCOPY  03/09/2025  . HIV Screening  Completed    The following portions of the patient's history were reviewed and updated as appropriate: allergies, current medications, past family history, past medical history, past social history, past surgical history and problem list.  Review of Systems Constitutional: negative for fatigue and fevers Eyes: positive for contacts/glasses and had recent eye exam Ears, nose, mouth, throat, and face: positive for stuffy ear,  sees dentist regularly Respiratory: negative for cough Cardiovascular: positive for mild LE edema if sits too long, negative for chest pressure/discomfort and irregular heart beat Gastrointestinal: negative for abdominal pain, change in bowel habits, constipation, diarrhea and dyspepsia Genitourinary:positive for abnormal menstrual periods and last MC 5 mos ago Integument/breast: negative for breast lump, breast tenderness, nipple discharge, rash and skin lesion(s)   Neuro: daily mild hot flashes, tolerable, does not wish to have meds  Objective:    There were no vitals taken for this visit. General appearance: alert, cooperative, appears stated age and no distress Head: Normocephalic, without obvious abnormality, atraumatic Eyes: negative findings: lids and lashes normal, conjunctivae and sclerae normal, corneas clear and pupils equal, round, reactive to light and accomodation Ears: normal TM and external ear canal left ear and abnormal TM right ear - air-fluid level Throat: lips, mucosa, and tongue normal; teeth and gums normal Neck: no adenopathy, no carotid bruit, supple, symmetrical, trachea midline and thyroid not enlarged, symmetric, no tenderness/mass/nodules Lungs: clear to auscultation bilaterally Heart: regular rate and rhythm, S1, S2 normal, no murmur, click, rub or gallop Abdomen: soft, non-tender; bowel sounds normal; no masses,  no organomegaly Extremities: extremities normal, atraumatic, no cyanosis or edema Pulses: 2+ and symmetric Skin: Skin color, texture, turgor normal. No rashes or lesions Lymph nodes: Cervical, supraclavicular, and axillary nodes normal. Neurologic: Grossly normal    Assessment:Plan   1. Vitamin D deficiency  start - Cholecalciferol (VITAMIN D3) 50000 UNITS CAPS; Take 1 capsule by mouth every 7 (seven) days.  Dispense: 12 capsule; Refill: 0 - Vit D  25 hydroxy (rtn osteoporosis monitoring); Future  2. Colon cancer screening 1st degree relative  w/colon cancer - Ambulatory referral to Gastroenterology  3. Toe pain, right Likely extensor tendon pain great toe Use aspercream tid  4. Otitis media with effusion, right Use mechanical method as discussed Use flonase R nare twice daily  F/u 1 yr CPE, get vit d checked in 3 mos

## 2015-03-22 ENCOUNTER — Encounter: Payer: Self-pay | Admitting: Internal Medicine

## 2015-04-07 ENCOUNTER — Ambulatory Visit (INDEPENDENT_AMBULATORY_CARE_PROVIDER_SITE_OTHER): Payer: 59 | Admitting: Nurse Practitioner

## 2015-04-07 ENCOUNTER — Encounter: Payer: Self-pay | Admitting: Nurse Practitioner

## 2015-04-07 VITALS — BP 104/62 | HR 52 | Temp 97.6°F | Ht 65.0 in | Wt 149.0 lb

## 2015-04-07 DIAGNOSIS — R3915 Urgency of urination: Secondary | ICD-10-CM

## 2015-04-07 LAB — POCT URINALYSIS DIPSTICK
Bilirubin, UA: NEGATIVE
Blood, UA: NEGATIVE
Glucose, UA: NEGATIVE
Ketones, UA: NEGATIVE
Nitrite, UA: NEGATIVE
Protein, UA: NEGATIVE
Spec Grav, UA: 1.02
Urobilinogen, UA: 0.2
pH, UA: 6.5

## 2015-04-07 MED ORDER — PHENAZOPYRIDINE HCL 200 MG PO TABS: 200.0000 mg | ORAL_TABLET | Freq: Three times a day (TID) | ORAL | Status: DC | PRN

## 2015-04-07 MED ORDER — NITROFURANTOIN MONOHYD MACRO 100 MG PO CAPS: 100.0000 mg | ORAL_CAPSULE | Freq: Two times a day (BID) | ORAL | Status: DC

## 2015-04-07 NOTE — Patient Instructions (Signed)
Start antibiotic. Our office will call if we need to change the antibiotic. Take pyridium to relax bladder, caution: urine tears & sweat will be orange. Do not be alarmed! Sip hydrating fluids (water, juice, colorless soda, decaff tea) every hour to flush kidneys. You may add 1000 mg vitamin C twice daily for few days.  Let us know if you have concerns.  Urinary Tract Infection Urinary tract infections (UTIs) can develop anywhere along your urinary tract. Your urinary tract is your body's drainage system for removing wastes and extra water. Your urinary tract includes two kidneys, two ureters, a bladder, and a urethra. Your kidneys are a pair of bean-shaped organs. Each kidney is about the size of your fist. They are located below your ribs, one on each side of your spine. CAUSES Infections are caused by microbes, which are microscopic organisms, including fungi, viruses, and bacteria. These organisms are so small that they can only be seen through a microscope. Bacteria are the microbes that most commonly cause UTIs. SYMPTOMS  Symptoms of UTIs may vary by age and gender of the patient and by the location of the infection. Symptoms in young women typically include a frequent and intense urge to urinate and a painful, burning feeling in the bladder or urethra during urination. Older women and men are more likely to be tired, shaky, and weak and have muscle aches and abdominal pain. A fever may mean the infection is in your kidneys. Other symptoms of a kidney infection include pain in your back or sides below the ribs, nausea, and vomiting. DIAGNOSIS To diagnose a UTI, your caregiver will ask you about your symptoms. Your caregiver also will ask to provide a urine sample. The urine sample will be tested for bacteria and white blood cells. White blood cells are made by your body to help fight infection. TREATMENT  Typically, UTIs can be treated with medication. Because most UTIs are caused by a bacterial  infection, they usually can be treated with the use of antibiotics. The choice of antibiotic and length of treatment depend on your symptoms and the type of bacteria causing your infection. HOME CARE INSTRUCTIONS  If you were prescribed antibiotics, take them exactly as your caregiver instructs you. Finish the medication even if you feel better after you have only taken some of the medication.  Drink enough water and fluids to keep your urine clear or pale yellow.  Avoid caffeine, tea, and carbonated beverages. They tend to irritate your bladder.  Empty your bladder often. Avoid holding urine for long periods of time.  Empty your bladder before and after sexual intercourse.  After a bowel movement, women should cleanse from front to back. Use each tissue only once. SEEK MEDICAL CARE IF:   You have back pain.  You develop a fever.  Your symptoms do not begin to resolve within 3 days. SEEK IMMEDIATE MEDICAL CARE IF:   You have severe back pain or lower abdominal pain.  You develop chills.  You have nausea or vomiting.  You have continued burning or discomfort with urination. MAKE SURE YOU:   Understand these instructions.  Will watch your condition.  Will get help right away if you are not doing well or get worse. Document Released: 07/31/2005 Document Revised: 04/21/2012 Document Reviewed: 11/29/2011 [facility] Patient Information 2014 [place].
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
## 2015-04-07 NOTE — Progress Notes (Signed)
Pre visit review using our clinic review tool, if applicable. No additional management support is needed unless otherwise documented below in the visit note. 

## 2015-04-10 ENCOUNTER — Telehealth: Payer: Self-pay | Admitting: Nurse Practitioner

## 2015-04-10 LAB — URINE CULTURE: Colony Count: >=100000

## 2015-04-10 NOTE — Telephone Encounter (Signed)
Urine cx pos for uti, ABX therapy is appropriate. Pt is feeling better, still has some urgency

## 2015-05-09 ENCOUNTER — Ambulatory Visit (AMBULATORY_SURGERY_CENTER): Payer: Self-pay

## 2015-05-09 VITALS — Ht 65.0 in | Wt 151.4 lb

## 2015-05-09 DIAGNOSIS — Z800 Family history of malignant neoplasm of digestive organs: Secondary | ICD-10-CM

## 2015-05-09 MED ORDER — POLYETHYLENE GLYCOL 3350 17 GM/SCOOP PO POWD: 1.0000 | Freq: Every day | ORAL | Status: DC

## 2015-05-09 NOTE — Progress Notes (Signed)
Per pt, no allergies to soy or egg products.Pt not taking any weight loss meds or using  O2 at home. 

## 2015-05-26 ENCOUNTER — Ambulatory Visit (AMBULATORY_SURGERY_CENTER): Payer: 59 | Admitting: Internal Medicine

## 2015-05-26 ENCOUNTER — Encounter: Payer: Self-pay | Admitting: Internal Medicine

## 2015-05-26 VITALS — BP 104/80 | HR 45 | Temp 97.9°F | Resp 26 | Ht 65.0 in | Wt 151.0 lb

## 2015-05-26 DIAGNOSIS — Z800 Family history of malignant neoplasm of digestive organs: Secondary | ICD-10-CM | POA: Insufficient documentation

## 2015-05-26 DIAGNOSIS — D120 Benign neoplasm of cecum: Secondary | ICD-10-CM

## 2015-05-26 DIAGNOSIS — Z1211 Encounter for screening for malignant neoplasm of colon: Secondary | ICD-10-CM

## 2015-05-26 MED ORDER — SODIUM CHLORIDE 0.9 % IV SOLN: 500.0000 mL | INTRAVENOUS | Status: DC

## 2015-05-26 NOTE — Progress Notes (Signed)
Called to room to assist during endoscopic procedure.  Patient ID and intended procedure confirmed with present staff. Received instructions for my participation in the procedure from the performing physician.  

## 2015-05-26 NOTE — Patient Instructions (Addendum)
There was a possible small polyp that I removed. It should not change the recommendation to repeat colonoscopy in 5 years. I will let you know pathology results and when to have another routine colonoscopy by mail.  All else normal.  I appreciate the opportunity to care for you. Gatha Mayer, MD, FACG  YOU HAD AN ENDOSCOPIC PROCEDURE TODAY AT [facility]:   Refer to the procedure report that was given to you for any specific questions about what was found during the examination.  If the procedure report does not answer your questions, please call your gastroenterologist to clarify.  If you requested that your care partner not be given the details of your procedure findings, then the procedure report has been included in a sealed envelope for you to review at your convenience later.  YOU SHOULD EXPECT: Some feelings of bloating in the abdomen. Passage of more gas than usual.  Walking can help get rid of the air that was put into your GI tract during the procedure and reduce the bloating. If you had a lower endoscopy (such as a colonoscopy or flexible sigmoidoscopy) you may notice spotting of blood in your stool or on the toilet paper. If you underwent a bowel prep for your procedure, you may not have a normal bowel movement for a few days.  Please Note:  You might notice some irritation and congestion in your nose or some drainage.  This is from the oxygen used during your procedure.  There is no need for concern and it should clear up in a day or so.  SYMPTOMS TO REPORT IMMEDIATELY:   Following lower endoscopy (colonoscopy or flexible sigmoidoscopy):  Excessive amounts of blood in the stool  Significant tenderness or worsening of abdominal pains  Swelling of the abdomen that is new, acute  Fever of 100F or higher   Following upper endoscopy (EGD)  Vomiting of blood or coffee ground material  New chest pain or pain under the shoulder blades  Painful or  persistently difficult swallowing  New shortness of breath  Fever of 100F or higher  Black, tarry-looking stools  For urgent or emergent issues, a gastroenterologist can be reached at any hour by calling [phone].   DIET: Your first meal following the procedure should be a small meal and then it is ok to progress to your normal diet. Heavy or fried foods are harder to digest and may make you feel nauseous or bloated.  Likewise, meals heavy in dairy and vegetables can increase bloating.  Drink plenty of fluids but you should avoid alcoholic beverages for 24 hours.  ACTIVITY:  You should plan to take it easy for the rest of today and you should NOT DRIVE or use heavy machinery until tomorrow (because of the sedation medicines used during the test).    FOLLOW UP: Our staff will call the number listed on your records the next business day following your procedure to check on you and address any questions or concerns that you may have regarding the information given to you following your procedure. If we do not reach you, we will leave a message.  However, if you are feeling well and you are not experiencing any problems, there is no need to return our call.  We will assume that you have returned to your regular daily activities without incident.  If any biopsies were taken you will be contacted by phone or by letter within the next 1-3 weeks.  Please  call us at [phone] if you have not heard about the biopsies in 3 weeks.    SIGNATURES/CONFIDENTIALITY: You and/or your care partner have signed paperwork which will be entered into your electronic medical record.  These signatures attest to the fact that that the information above on your After Visit Summary has been reviewed and is understood.  Full responsibility of the confidentiality of this discharge information lies with you and/or your care-partner.  Polyp information given.

## 2015-05-26 NOTE — Progress Notes (Signed)
To recovery, report to Scott, RN, VSS 

## 2015-05-26 NOTE — Op Note (Signed)
[address] [place], [id number]   COLONOSCOPY PROCEDURE REPORT  PATIENT: Hannah, [name]  MR#: [id number] BIRTHDATE: 25-Feb-1961 , 54  yrs. old GENDER: female ENDOSCOPIST: Gatha Mayer, MD, [facility] PROCEDURE DATE:  05/26/2015 PROCEDURE:   Colonoscopy, screening and Colonoscopy with biopsy First Screening Colonoscopy - Avg.  risk and is 50 yrs.  old or older Yes.  Prior Negative Screening - Now for repeat screening. N/A  History of Adenoma - Now for follow-up colonoscopy & has been > or = to 3 yrs.  N/A  Polyps removed today? Yes ASA CLASS:   Class II INDICATIONS:Screening for colonic neoplasia and FH Colon or Rectal Adenocarcinoma. MEDICATIONS: Propofol 250 mg IV and Monitored anesthesia care  DESCRIPTION OF PROCEDURE:   After the risks benefits and alternatives of the procedure were thoroughly explained, informed consent was obtained.  The digital rectal exam revealed no abnormalities of the rectum.   The LB [id number]  endoscope was introduced through the anus and advanced to the cecum, which was identified by both the appendix and ileocecal valve. No adverse events experienced.   The quality of the prep was excellent.  The instrument was then slowly withdrawn as the colon was fully examined. Estimated blood loss is zero unless otherwise noted in this procedure report.      COLON FINDINGS: A sessile polyp measuring 2 mm in size was found at the appendiceal orifice and cecum.  A polypectomy was performed with cold forceps.  The resection was complete, the polyp tissue was completely retrieved and sent to histology.   The examination was otherwise normal.  Retroflexed views revealed no abnormalities. The time to cecum = 3.3 Withdrawal time = 9.9   The scope was withdrawn and the procedure completed. COMPLICATIONS: There were no immediate complications.  ENDOSCOPIC IMPRESSION: 1.   Sessile polyp was found at the appendiceal orifice and  cecum; polypectomy was performed with cold forceps 2.   The examination was otherwise normal - excellent prep - first screening + FHx CRCA father at 50  RECOMMENDATIONS: Repeat Colonoscopy in 5 years most likely  eSigned:  Gatha Mayer, MD, [facility] 05/26/2015 10:43 AM   cc: Nicky Pugh, NP and The Patient

## 2015-05-29 ENCOUNTER — Telehealth: Payer: Self-pay | Admitting: *Deleted

## 2015-05-29 NOTE — Telephone Encounter (Signed)
  Follow up Call-  Call back number 05/26/2015  Post procedure Call Back phone  # [phone]  Permission to leave phone message Yes     No answer, left message.

## 2015-05-30 ENCOUNTER — Encounter: Payer: Self-pay | Admitting: Internal Medicine

## 2016-01-25 DIAGNOSIS — N906 Unspecified hypertrophy of vulva: Secondary | ICD-10-CM | POA: Diagnosis not present

## 2016-01-25 DIAGNOSIS — Z131 Encounter for screening for diabetes mellitus: Secondary | ICD-10-CM | POA: Diagnosis not present

## 2016-01-25 DIAGNOSIS — Z1321 Encounter for screening for nutritional disorder: Secondary | ICD-10-CM | POA: Diagnosis not present

## 2016-01-25 DIAGNOSIS — Z1329 Encounter for screening for other suspected endocrine disorder: Secondary | ICD-10-CM | POA: Diagnosis not present

## 2016-01-25 DIAGNOSIS — Z13220 Encounter for screening for lipoid disorders: Secondary | ICD-10-CM | POA: Diagnosis not present

## 2016-01-25 DIAGNOSIS — Z13228 Encounter for screening for other metabolic disorders: Secondary | ICD-10-CM | POA: Diagnosis not present

## 2016-04-29 ENCOUNTER — Ambulatory Visit (INDEPENDENT_AMBULATORY_CARE_PROVIDER_SITE_OTHER): Payer: 59 | Admitting: Family Medicine

## 2016-04-29 ENCOUNTER — Encounter: Payer: Self-pay | Admitting: Family Medicine

## 2016-04-29 VITALS — BP 128/82 | HR 46 | Temp 98.1°F | Resp 18 | Ht 65.0 in | Wt 145.8 lb

## 2016-04-29 DIAGNOSIS — G933 Postviral fatigue syndrome: Secondary | ICD-10-CM

## 2016-04-29 DIAGNOSIS — G9331 Postviral fatigue syndrome: Secondary | ICD-10-CM

## 2016-04-29 DIAGNOSIS — J3489 Other specified disorders of nose and nasal sinuses: Secondary | ICD-10-CM

## 2016-04-29 DIAGNOSIS — H8111 Benign paroxysmal vertigo, right ear: Secondary | ICD-10-CM

## 2016-04-29 DIAGNOSIS — R5383 Other fatigue: Secondary | ICD-10-CM

## 2016-04-29 DIAGNOSIS — H6120 Impacted cerumen, unspecified ear: Secondary | ICD-10-CM | POA: Insufficient documentation

## 2016-04-29 DIAGNOSIS — H6121 Impacted cerumen, right ear: Secondary | ICD-10-CM | POA: Diagnosis not present

## 2016-04-29 MED ORDER — CARBAMIDE PEROXIDE 6.5 % OT SOLN
5.0000 [drp] | Freq: Two times a day (BID) | OTIC | Status: DC
Start: 2016-04-29 — End: 2016-04-29

## 2016-04-29 MED ORDER — CARBAMIDE PEROXIDE 6.5 % OT SOLN: 5.0000 [drp] | Freq: Two times a day (BID) | OTIC | Status: DC

## 2016-04-29 MED FILL — EAR DROPS 6.5%: 6.5 | 30 days supply | Qty: 15 | Fill #0

## 2016-04-29 NOTE — Progress Notes (Signed)
Patient ID: Hannah [name], female   DOB: 23-Apr-1961, 55 y.o.   MRN: [id number]    Hannah [name] , [birth year], 55 y.o., female MRN: [id number]  CC: sinus pressure Subjective: Pt presents for an acute OV with complaints of acute sinus pressure/pain of 4 days duration. Associated symptoms include diarrhea, fatigue. She feels she is improving daily, but has concerns over the recurrent nasal pain. She reports the pain started over a year ago, is located over her right nasal bone. The pain occurs intermittently and is usually associated with intermittent vertigo. The pain will last a few days and then resolve on its own. She states its is uncomfortable to touch or place pressure over this side of her nose. She reports a door hitting this  area a couple years ago, but no xray obtained at that time. She has right ear fullness with increase cerumen production on that side. She denies hearing changes. Her vertigo is positional and is increased when nasal pain occurs and is right sided. She performs positional vertigo maneuvers and the vertigo improves.  No fever, chills, nasal congestion, rhinorrhea or headache. She had taken flonase in the past, but states this was not helpful and would burn her nose with use.   No Known Allergies Social History  Substance Use Topics  . Smoking status: Former Smoker -- 2 years    Types: Cigarettes    Quit date: 11/05/1983  . Smokeless tobacco: Never Used     Comment: 1/2 pack a week  . Alcohol Use: No     Comment: 1 glass of wine weekly   Past Medical History  Diagnosis Date  . Left ankle sprain 10/07/2011   Past Surgical History  Procedure Laterality Date  . Foot surgery Left     bone spur  . Tonsilectomy/adenoidectomy with myringotomy    . Wisdom tooth extraction     Family History  Problem Relation Age of Onset  . Hypertension Mother   . Stroke Mother   . Diabetes Mother   . Cancer Father 14    colon  . Hypertension Sister   . Colon polyps Sister    . Heart disease Brother 66  . Diabetes Maternal Uncle   . Diabetes Maternal Uncle      Medication List       This list is accurate as of: 04/29/16 10:09 AM.  Always use your most recent med list.               fluticasone 50 MCG/ACT nasal spray  Commonly known as:  FLONASE  Place 1 spray into both nostrils 2 (two) times daily.     Vitamin D-3 1000 units Caps  Take 1,000 Units by mouth.         ROS: Negative, with the exception of above mentioned in HPI   Objective:  BP 128/82 mmHg  Pulse 46  Temp(Src) 98.1 F (36.7 C)  Resp 18  Ht 5\' 5"  (1.651 m)  Wt 145 lb 12.8 oz (66.134 kg)  BMI 24.26 kg/m2  SpO2 99%  LMP 06/30/2015 Body mass index is 24.26 kg/(m^2). Gen: Afebrile. No acute distress. Nontoxic in appearance, well developed, well nourished, female. Pleasant  HENT: AT. [place]. Bilateral TM visualized, right TM partial blocked by cerumen, air fluid level present/fullness, no erythema or bulging.  Left TM WNL, normal EAM. MMM, no oral lesions. Bilateral nares no erythema, no swelling, no nasal polyps/cyst/ulceration visualized.  Throat without erythema or exudates. No cough, hoarseness or facial sinus  pressure on exam. TTP right nasal bone/frontal process of maxilla. Small defect/bump/ridge Rt nasal bone midline.  Eyes:Pupils Equal Round Reactive to light, Extraocular movements intact,  Conjunctiva without redness, discharge or icterus. Neck/lymp/endocrine: Supple, No lymphadenopathy CV: RRR  Chest: CTAB, no wheeze or crackles.   Skin: No rashes, purpura or petechiae. WWW. No skin changes Neuro: Normal gait. PERLA. EOMi. Alert. Oriented x3   Assessment/Plan: Hannah [name] is a 55 y.o. female present for acute OV for  Cerumen impaction, right - Debrox as prescribed and then a couple times a month for maintenance.  - carbamide peroxide (DEBROX) 6.5 % otic solution; Place 5 drops into the right ear 2 (two) times daily.  Dispense: 15 mL; Refill: 0  Sinus pain - ?  Nasal polyp, mucoid cyst or prior to trauma to rt max sinus, associated with vertigo and rt ear fullness. Discussed possibility of all the above causing chronic sinus issues affecting her right max sinus/ear.  - pt encouraged to continue an antihistamine of choice and flonase. Consider nasal saline flushes/nettiepot.  - If worsening would consider oral abx/steroid.  - discussed meclizine for vertigo, however pts vertigo is not frequent enough or lasting long enough to get benefit from medication, could consider in the future if worsening.  - ENT referral for further eval/treat considering chronicity of problem.   Post viral syndrome - supportive therapy. Pt improving. Likely virus related, if symptoms return cover with abx then. Does not appear infectious at this time.   F/u PRN  electronically signed by:  Howard Pouch, DO  [place]

## 2016-04-29 NOTE — Patient Instructions (Addendum)
Debrox in the left ear as prescribed. Then a couple times a month for maintenance.  ENT referral - Right ear/sinus placed  What you experienced this weekend is likely viral. Treat with over the counter medicines. The virus is probably what made your nose act up.  - antihistamine of choice and flonase. Consider nasal saline flushes/nettiepot.  - If worsening would consider oral abx/steroid.

## 2016-06-06 DIAGNOSIS — J039 Acute tonsillitis, unspecified: Secondary | ICD-10-CM | POA: Diagnosis not present

## 2016-06-06 DIAGNOSIS — H6121 Impacted cerumen, right ear: Secondary | ICD-10-CM | POA: Diagnosis not present

## 2016-06-06 DIAGNOSIS — J040 Acute laryngitis: Secondary | ICD-10-CM | POA: Diagnosis not present

## 2016-06-06 DIAGNOSIS — J320 Chronic maxillary sinusitis: Secondary | ICD-10-CM | POA: Diagnosis not present

## 2016-06-06 DIAGNOSIS — J322 Chronic ethmoidal sinusitis: Secondary | ICD-10-CM | POA: Diagnosis not present

## 2016-06-06 MED FILL — CEFUROXIME AXETIL 250 MG TA: 250 | 10 days supply | Qty: 20 | Fill #0

## 2016-06-28 DIAGNOSIS — J322 Chronic ethmoidal sinusitis: Secondary | ICD-10-CM | POA: Diagnosis not present

## 2016-06-28 DIAGNOSIS — J320 Chronic maxillary sinusitis: Secondary | ICD-10-CM | POA: Diagnosis not present

## 2016-06-28 DIAGNOSIS — H8143 Vertigo of central origin, bilateral: Secondary | ICD-10-CM | POA: Diagnosis not present

## 2016-10-15 DIAGNOSIS — N9419 Other specified dyspareunia: Secondary | ICD-10-CM | POA: Diagnosis not present

## 2016-10-15 DIAGNOSIS — N9069 Other specified hypertrophy of vulva: Secondary | ICD-10-CM | POA: Diagnosis not present

## 2016-10-15 MED FILL — IBUPROFEN 800 MG TABLET: 800 | 7 days supply | Qty: 20 | Fill #0

## 2016-10-17 DIAGNOSIS — N906 Unspecified hypertrophy of vulva: Secondary | ICD-10-CM | POA: Diagnosis not present

## 2016-10-17 DIAGNOSIS — N941 Unspecified dyspareunia: Secondary | ICD-10-CM | POA: Diagnosis not present

## 2016-10-17 DIAGNOSIS — N9069 Other specified hypertrophy of vulva: Secondary | ICD-10-CM | POA: Diagnosis not present

## 2016-11-12 ENCOUNTER — Ambulatory Visit (INDEPENDENT_AMBULATORY_CARE_PROVIDER_SITE_OTHER): Payer: 59 | Admitting: Family Medicine

## 2016-11-12 ENCOUNTER — Encounter: Payer: Self-pay | Admitting: Family Medicine

## 2016-11-12 DIAGNOSIS — S6991XA Unspecified injury of right wrist, hand and finger(s), initial encounter: Secondary | ICD-10-CM

## 2016-11-12 DIAGNOSIS — S93401A Sprain of unspecified ligament of right ankle, initial encounter: Secondary | ICD-10-CM

## 2016-11-12 DIAGNOSIS — Z23 Encounter for immunization: Secondary | ICD-10-CM

## 2016-11-12 MED ORDER — AMOXICILLIN-POT CLAVULANATE 875-125 MG PO TABS: 1.0000 | ORAL_TABLET | Freq: Two times a day (BID) | ORAL | 0 refills | Status: DC

## 2016-11-12 MED FILL — AMOX TR-K CLV 875-125 MG TA: 875-125 | 7 days supply | Qty: 14 | Fill #0

## 2016-11-12 NOTE — Patient Instructions (Addendum)
Start Augmentin today. Take every 12 hours (with meal ok).  Try to keep dressing on over night. Then can take blue coban off if needed only and to check finger. Leave guaze in place and redress with coban (if you take it off).  Otherwise, leave in place for 48 hours.  Schedule appt to recheck Thursday or Friday. We can cut back on the amount of dressing then.  Monitor for signs of infection, redness, pus-like drainage, fever.  The nail will likely not return normally, but we can try.    Ankle: Elevate, rest, wear brace, ice and take aleve 1-2 times a day for next few days to help with pain and swelling. If worsening we should xray. Wear brace for 1 week then , wrap with ACE only.

## 2016-11-12 NOTE — Progress Notes (Signed)
Hannah [name] , 1961/04/15, 56 y.o., female MRN: [id number] Patient Care Team    Relationship Specialty Notifications Start End  [name], DO PCP - General Family Medicine  04/29/16     CC: fall injury Subjective:   Pt presents today after slipping on ice when jogging this morning. She reports she rolled her Right ankle out and tried to catch her self with her right hand, ripping of the nailbed in the right ring finger in the process. She has been able to walk on her ankle after injury. She has attempted to elevate it and ice her ankle. She reports it feels better moving her ankle, but if his swelling and starting to bruise. Her right ring finger has been bleeding, and the majority of the nail is missing.  No Known Allergies Social History  Substance Use Topics  . Smoking status: Former Smoker    Years: 2.00    Types: Cigarettes    Quit date: 11/05/1983  . Smokeless tobacco: Never Used     Comment: 1/2 pack a week  . Alcohol use No     Comment: 1 glass of wine weekly   Past Medical History:  Diagnosis Date  . Left ankle sprain 10/07/2011   Past Surgical History:  Procedure Laterality Date  . FOOT SURGERY Left    bone spur  . TONSILECTOMY/ADENOIDECTOMY WITH MYRINGOTOMY    . WISDOM TOOTH EXTRACTION     Family History  Problem Relation Age of Onset  . Hypertension Mother   . Stroke Mother   . Diabetes Mother   . Cancer Father 68    colon  . Hypertension Sister   . Colon polyps Sister   . Heart disease Brother 7  . Diabetes Maternal Uncle   . Diabetes Maternal Uncle    Allergies as of 11/12/2016   No Known Allergies     Medication List       Accurate as of 11/12/16  2:50 PM. Always use your most recent med list.          amoxicillin-clavulanate 875-125 MG tablet Commonly known as:  AUGMENTIN Take 1 tablet by mouth 2 (two) times daily.   fluticasone 50 MCG/ACT nasal spray Commonly known as:  FLONASE Place 1 spray into both nostrils 2 (two) times  daily.   ibuprofen 800 MG tablet Commonly known as:  ADVIL,MOTRIN   Vitamin D-3 1000 units Caps Take 1,000 Units by mouth.       No results found for this or any previous visit (from the past 24 hour(s)). No results found.   ROS: Negative, with the exception of above mentioned in HPI   Objective:  LMP 06/30/2015  There is no height or weight on file to calculate BMI. Gen: Afebrile. No acute distress. Nontoxic in appearance, well developed, well MSK: Right ring finger: Proximal one third of nail/nailbed intact. Distal two thirds of nailbed with injury, missing nail. No penetrating laceration identified. Small portion of very distal nail remained. Neurovascularly intact distally. Good cap refill. Right ankle: Bruising present lateral ankle, and portion of anterior foot. Lateral Swelling present. Tender to palpation over his swelling. No tenderness laterally or fifth metatarsal. Negative squeeze test. Ambulating with mild discomfort. Neuro: Mild limp to gait. PERLA. EOMi. Alert. Oriented x3   Procedure Note:  PROCEDURE NOTE: nail bed injury debridement.  Performed by: Dr. Raoul Pitch Indication: Injury Anesthesia was obtained with 1.5 ml of 1% lidocaine plain by digital block. The area soaked in saline/peroxide mixture  and then was prepped in the usual sterile fashion with Betadine. Nailbed inspected with loops, and debrided. Removal of multiple black fibers and remnants of distal nail. Silver nitrate applied to small area of bleeding. No lacerations to nailbed identified. The patient tolerated the procedure well. Wound care instructions were given. Patient to follow up 2-3 days.  Post-Procedure Diagnosis: Nailbed injury  Complications: None Estimated Blood Loss:  Minimal  Assessment/Plan: Hannah [name] is a 56 y.o. female present for sameday OV for  Need for diphtheria-tetanus-pertussis (Tdap) vaccine - Tdap vaccine greater than or equal to 7yo IM Injury of nail bed of finger of  right hand, initial encounter - Very distal portion of nail removed, nailbed inspected, mild debridement, silver nitrate used to control bleeding. Proximal 1/3 nail and nailbed intact. Petroleum gauze dressing placed over nailbed, mild pressure dressing with 2 x 2 and coban. - tdap administered.  - amoxicillin-clavulanate (AUGMENTIN) 875-125 MG tablet; Take 1 tablet by mouth 2 (two) times daily.  Dispense: 14 tablet; Refill: 0 - Patient provided with AVS information on keeping area clean, dry and dressing intact for next 48 hours. Wound will then be rechecked, and redressed in the office. Discussed likely nail deformity upon regrowth. Patient agreeable with plan. Sprain of right ankle, unspecified ligament, initial encounter - By exam appears to be a sprained ankle. Patient advised to rest, elevate, keep compression/brace applied for 1 week, ice and Aleve twice a day next couple days. - If worsening will need to obtain x-rays, she is ambulating.    electronically signed by:  Howard Pouch, DO  [place]

## 2016-11-14 ENCOUNTER — Encounter: Payer: Self-pay | Admitting: Family Medicine

## 2016-11-14 ENCOUNTER — Ambulatory Visit (INDEPENDENT_AMBULATORY_CARE_PROVIDER_SITE_OTHER): Payer: 59 | Admitting: Family Medicine

## 2016-11-14 VITALS — BP 124/80 | HR 59 | Temp 97.7°F | Resp 18 | Wt 141.0 lb

## 2016-11-14 DIAGNOSIS — Z5189 Encounter for other specified aftercare: Secondary | ICD-10-CM

## 2016-11-14 NOTE — Progress Notes (Signed)
    Hannah [name] , 1961/01/11, 56 y.o., female MRN: [id number] Patient Care Team    Relationship Specialty Notifications Start End  [name], DO PCP - General Family Medicine  04/29/16     CC: wound follow up Subjective:  Pt presents to for follow up on nail bed injury with wound check. Pt denies increased pain, redness, swelling or drainage. She received the tdap booster and is tolerating Augmentin.   No Known Allergies Social History  Substance Use Topics  . Smoking status: Former Smoker    Years: 2.00    Types: Cigarettes    Quit date: 11/05/1983  . Smokeless tobacco: Never Used     Comment: 1/2 pack a week  . Alcohol use No     Comment: 1 glass of wine weekly   Past Medical History:  Diagnosis Date  . Left ankle sprain 10/07/2011   Past Surgical History:  Procedure Laterality Date  . FOOT SURGERY Left    bone spur  . TONSILECTOMY/ADENOIDECTOMY WITH MYRINGOTOMY    . WISDOM TOOTH EXTRACTION     Family History  Problem Relation Age of Onset  . Hypertension Mother   . Stroke Mother   . Diabetes Mother   . Cancer Father 35    colon  . Hypertension Sister   . Colon polyps Sister   . Heart disease Brother 55  . Diabetes Maternal Uncle   . Diabetes Maternal Uncle    Allergies as of 11/14/2016   No Known Allergies     Medication List       Accurate as of 11/14/16 11:21 AM. Always use your most recent med list.          amoxicillin-clavulanate 875-125 MG tablet Commonly known as:  AUGMENTIN Take 1 tablet by mouth 2 (two) times daily.   fluticasone 50 MCG/ACT nasal spray Commonly known as:  FLONASE Place 1 spray into both nostrils 2 (two) times daily.   ibuprofen 800 MG tablet Commonly known as:  ADVIL,MOTRIN   Vitamin D-3 1000 units Caps Take 1,000 Units by mouth.       No results found for this or any previous visit (from the past 24 hour(s)). No results found.   ROS: Negative, with the exception of above mentioned in HPI   Objective:    BP 124/80 (BP Location: Left Arm, Patient Position: Sitting, Cuff Size: Normal)   Pulse (!) 59   Temp 97.7 F (36.5 C)   Resp 18   Wt 141 lb (64 kg)   LMP 06/30/2015   SpO2 99%   BMI 23.46 kg/m  Body mass index is 23.46 kg/m. Gen: Afebrile. No acute distress. Nontoxic in appearance, well developed, well nourished.  Nail/Skin: dressing removed after soaking with saline. No bleeding or drainage noted. Healing well. NV intact.  Assessment/Plan: Hannah [name] is a 56 y.o. female present for follow-up OV for  - healing well, does not appear infected.  - continue Augmentin. Dressing changes explained. Apply fresh dressing daily after shower. May shower without bandage, no standing water emersion, apply ointment and petroleum guaze daily with coban dressing to cover.  - Discussed possibility of still losing the nail that remains, but will need to leave in place to allow new nail growth. Cuticles need to remain soft during regrowth (bag balm recommended).  - F/u PRN, or signs of infection develop.   electronically signed by:  Howard Pouch, DO  [place]

## 2016-11-14 NOTE — Patient Instructions (Signed)
-   healing well, does not appear infected.  - continue Augmentin.  Apply fresh dressing daily after shower. May shower without bandage (apply ointment prior), no standing water emersion, apply ointment and petroleum guaze daily with coban dressing to cover.  - There is possibility of still losing the nail that remains, but will need to leave in place to allow new nail growth. Cuticles need to remain soft during regrowth (bag balm recommended).  - F/u PRN, or signs of infection develop.

## 2016-11-26 DIAGNOSIS — H5213 Myopia, bilateral: Secondary | ICD-10-CM | POA: Diagnosis not present

## 2016-11-26 DIAGNOSIS — H524 Presbyopia: Secondary | ICD-10-CM | POA: Diagnosis not present

## 2017-05-02 MED FILL — YUVAFEM 10 MCG VAGINAL INSE: 10 | 84 days supply | Qty: 24 | Fill #0

## 2017-10-01 DIAGNOSIS — Z6826 Body mass index (BMI) 26.0-26.9, adult: Secondary | ICD-10-CM | POA: Diagnosis not present

## 2017-10-01 DIAGNOSIS — Z01419 Encounter for gynecological examination (general) (routine) without abnormal findings: Secondary | ICD-10-CM | POA: Diagnosis not present

## 2017-10-02 LAB — LIPID PANEL
Cholesterol: 65 (ref 0–200)
HDL: 99 — AB (ref 35–70)
LDL Cholesterol: 110

## 2017-10-07 LAB — CBC AND DIFFERENTIAL
HCT: 40 (ref 36–46)
Hemoglobin: 13.8 (ref 12.0–16.0)
Platelets: 223 (ref 150–399)
WBC: 4.2

## 2017-10-07 LAB — HEMOGLOBIN A1C: Hemoglobin A1C: 5.1

## 2017-10-09 ENCOUNTER — Other Ambulatory Visit: Payer: Self-pay | Admitting: Obstetrics & Gynecology

## 2017-10-09 DIAGNOSIS — Z780 Asymptomatic menopausal state: Secondary | ICD-10-CM

## 2017-10-21 ENCOUNTER — Ambulatory Visit
Admission: RE | Admit: 2017-10-21 | Discharge: 2017-10-21 | Disposition: A | Discharge status: Home / Self Care | Payer: 59 | Source: Ambulatory Visit | Attending: Obstetrics & Gynecology | Admitting: Obstetrics & Gynecology

## 2017-10-21 DIAGNOSIS — M85851 Other specified disorders of bone density and structure, right thigh: Secondary | ICD-10-CM | POA: Diagnosis not present

## 2017-10-21 DIAGNOSIS — Z780 Asymptomatic menopausal state: Secondary | ICD-10-CM | POA: Diagnosis not present

## 2018-10-01 LAB — HM PAP SMEAR: HM Pap smear: NEGATIVE

## 2019-09-10 ENCOUNTER — Other Ambulatory Visit: Payer: Self-pay

## 2019-09-10 ENCOUNTER — Ambulatory Visit (INDEPENDENT_AMBULATORY_CARE_PROVIDER_SITE_OTHER): Payer: 59 | Admitting: Family Medicine

## 2019-09-10 ENCOUNTER — Encounter: Payer: Self-pay | Admitting: Family Medicine

## 2019-09-10 VITALS — BP 118/76 | HR 52 | Temp 98.0°F | Resp 17 | Ht 65.0 in | Wt 160.5 lb

## 2019-09-10 DIAGNOSIS — B3783 Candidal cheilitis: Secondary | ICD-10-CM | POA: Diagnosis not present

## 2019-09-10 DIAGNOSIS — B370 Candidal stomatitis: Secondary | ICD-10-CM

## 2019-09-10 MED ORDER — NYSTATIN 100000 UNIT/ML MT SUSP: 5.0000 mL | Freq: Four times a day (QID) | OROMUCOSAL | 0 refills | Status: DC

## 2019-09-10 NOTE — Progress Notes (Signed)
Hannah [name] , 1960/11/19, 58 y.o., female MRN: [id number] Patient Care Team    Relationship Specialty Notifications Start End  [name], DO PCP - General Family Medicine  04/29/16     Chief Complaint  Patient presents with  . Oral Pain    Burning in mouth x1 week      Subjective: Pt presents for an OV with complaints of mouth burning of 1 week duration.  Associated symptoms include nothing. She denies fever ,chills, nausea, rash, heartburn, reflux. She has not seen any white patches in her mouth or throat. She has not changed her diet or toothpaste etc. She denies dry mouth symptoms, but does wear a mask at work daily. She endorses mouth burning that is all over her mouth and tongue. She may have seen small red patches. No sick contacts.   Depression screen PHQ 2/9 04/29/2016  Decreased Interest 0  Down, Depressed, Hopeless 0  PHQ - 2 Score 0    No Known Allergies Social History   Social History Narrative  . Not on file   Past Medical History:  Diagnosis Date  . Left ankle sprain 10/07/2011   Past Surgical History:  Procedure Laterality Date  . FOOT SURGERY Left    bone spur  . TONSILECTOMY/ADENOIDECTOMY WITH MYRINGOTOMY    . WISDOM TOOTH EXTRACTION     Family History  Problem Relation Age of Onset  . Hypertension Mother   . Stroke Mother   . Diabetes Mother   . Cancer Father 28       colon  . Hypertension Sister   . Colon polyps Sister   . Heart disease Brother 31  . Diabetes Maternal Uncle   . Diabetes Maternal Uncle    Allergies as of 09/10/2019   No Known Allergies     Medication List       Accurate as of September 10, 2019 11:40 AM. If you have any questions, ask your nurse or doctor.        STOP taking these medications   amoxicillin-clavulanate 875-125 MG tablet Commonly known as: AUGMENTIN Stopped by: Howard Pouch, DO   fluticasone 50 MCG/ACT nasal spray Commonly known as: FLONASE Stopped by: Howard Pouch, DO   ibuprofen 800 MG  tablet Commonly known as: ADVIL Stopped by: Howard Pouch, DO     TAKE these medications   Vitamin D-3 25 MCG (1000 UT) Caps Take 1,000 Units by mouth.       All past medical history, surgical history, allergies, family history, immunizations andmedications were updated in the EMR today and reviewed under the history and medication portions of their EMR.     ROS: Negative, with the exception of above mentioned in HPI   Objective:  BP 118/76 (BP Location: Left Arm, Patient Position: Sitting, Cuff Size: Normal)   Pulse (!) 52   Temp 98 F (36.7 C) (Temporal)   Resp 17   Ht 5\' 5"  (1.651 m)   Wt 160 lb 8 oz (72.8 kg)   LMP 06/30/2015   SpO2 99%   BMI 26.71 kg/m  Body mass index is 26.71 kg/m. Gen: Afebrile. No acute distress. Nontoxic in appearance, well developed, well nourished.  HENT: AT. [place]. Bilateral TM visualized without erythema or bulging. mildly dry mucous membranes,  no oral lesions. Small light white patch left side of tongue, easily removed with tongue blade. Throat without erythema or exudates. No cough or hoarseness. Small crack left oral commissure.  Eyes:Pupils Equal Round Reactive to  light, Extraocular movements intact,  Conjunctiva without redness, discharge or icterus. Neck/lymp/endocrine: Supple,no lymphadenopathy    No exam data present No results found. No results found for this or any previous visit (from the past 24 hour(s)).  Assessment/Plan: Hannah [name] is a 58 y.o. female present for OV for  Oral thrush/Candidal cheilitis Discussed possible causes with her today and her mouth is mildly dry. She is wearing a mask the majority of day, which is likely causing decrease in water consumption leading to dry membranes. Exam was positive for left angular cheilitis, likely from mild case of thrush/yeast and light white patches on her tongue.  - nystatin swish prescribed. Switch out mask more routinely (at least twice a week.Marland Kitchen Hydrate. Start OTC biotene  swish and/or lozenges. Change out toothbrush with treatment. If oral commissure cheilitis does not resolve with above tx,  will call in clotrimazole cream for her.   - F/U PRN    Reviewed expectations re: course of current medical issues.  Discussed self-management of symptoms.  Outlined signs and symptoms indicating need for more acute intervention.  Patient verbalized understanding and all questions were answered.  Patient received an After-Visit Summary.    No orders of the defined types were placed in this encounter.    Note is dictated utilizing voice recognition software. Although note has been proof read prior to signing, occasional typographical errors still can be missed. If any questions arise, please do not hesitate to call for verification.   electronically signed by:  Howard Pouch, DO  [place]

## 2019-09-10 NOTE — Patient Instructions (Signed)
Oral Thrush, Adult  Oral thrush, also called oral candidiasis, is a fungal infection that develops in the mouth and throat and on the tongue. It causes white patches to form on the mouth and tongue. Thrush is most common in older adults, but it can occur at any age. Many cases of thrush are mild, but this infection can also be serious. Thrush can be a repeated (recurrent) problem for certain people who have a weak body defense system (immune system). The weakness can be caused by chronic illnesses, or by taking medicines that limit the body's ability to fight infection. If a person has difficulty fighting infection, the fungus that causes thrush can spread through the body. This can cause life-threatening blood or organ infections. What are the causes? This condition is caused by a fungus (yeast) called Candida albicans.  This fungus is normally present in small amounts in the mouth and on other mucous membranes. It usually causes no harm.  If conditions are present that allow the fungus to grow without control, it invades surrounding tissues and becomes an infection.  Other Candida species can also lead to thrush (rare). What increases the risk? This condition is more likely to develop in:  People with a weakened immune system.  Older adults.  People with HIV (human immunodeficiency virus).  People with diabetes.  People with dry mouth (xerostomia).  Pregnant women.  People with poor dental care, especially people who have false teeth.  People who use antibiotic medicines. What are the signs or symptoms? Symptoms of this condition can vary from mild and moderate to severe and persistent. Symptoms may include:  A burning feeling in the mouth and throat. This can occur at the start of a thrush infection.  White patches that stick to the mouth and tongue. The tissue around the patches may be red, raw, and painful. If rubbed (during tooth brushing, for example), the patches and the  tissue of the mouth may bleed easily.  A bad taste in the mouth or difficulty tasting foods.  A cottony feeling in the mouth.  Pain during eating and swallowing.  Poor appetite.  Cracking at the corners of the mouth. How is this diagnosed? This condition is diagnosed based on:  Physical exam. Your health care provider will look in your mouth.  Health history. Your health care provider will ask you questions about your health. How is this treated? This condition is treated with medicines called antifungals, which prevent the growth of fungi. These medicines are either applied directly to the affected area (topical) or swallowed (oral). The treatment will depend on the severity of the condition. Mild thrush Mild cases of thrush may clear up with the use of an antifungal mouth rinse or lozenges. Treatment usually lasts about 14 days. Moderate to severe thrush  More severe thrush infections that have spread to the esophagus are treated with an oral antifungal medicine. A topical antifungal medicine may also be used.  For some severe infections, treatment may need to continue for more than 14 days.  Oral antifungal medicines are rarely used during pregnancy because they may be harmful to the unborn child. If you are pregnant, talk with your health care provider about options for treatment. Persistent or recurrent thrush For cases of thrush that do not go away or keep coming back:  Treatment may be needed twice as long as the symptoms last.  Treatment will include both oral and topical antifungal medicines.  People with a weakened immune system can take   an antifungal medicine on a continuous basis to prevent thrush infections. It is important to treat conditions that make a person more likely to get thrush, such as diabetes or HIV. Follow these instructions at home: Medicines  Take over-the-counter and prescription medicines only as told by your health care provider.  Talk with  your health care provider about an over-the-counter medicine called gentian violet, which kills bacteria and fungi. Relieving soreness and discomfort To help reduce the discomfort of thrush:  Drink cold liquids such as water or iced tea.  Try flavored ice treats or frozen juices.  Eat foods that are easy to swallow, such as gelatin, ice cream, or custard.  Try drinking from a straw if the patches in your mouth are painful.  General instructions  Eat plain, unflavored yogurt as directed by your health care provider. Check the label to make sure the yogurt contains live cultures. This yogurt can help healthy bacteria to grow in the mouth and can stop the growth of the fungus that causes thrush.  If you wear dentures, remove the dentures before going to bed, brush them vigorously, and soak them in a cleaning solution as directed by your health care provider.  Rinse your mouth with a warm salt-water mixture several times a day. To make a salt-water mixture, completely dissolve 1/2-1 tsp of salt in 1 cup of warm water. Contact a health care provider if:  Your symptoms are getting worse or are not improving within 7 days of starting treatment.  You have symptoms of a spreading infection, such as white patches on the skin outside of the mouth. This information is not intended to replace advice given to you by your health care provider. Make sure you discuss any questions you have with your health care provider. Document Released: 07/16/2004 Document Revised: 01/23/2018 Document Reviewed: 07/15/2016 Elsevier Patient Education  2020 [employer]  

## 2019-09-21 ENCOUNTER — Encounter: Payer: Self-pay | Admitting: Family Medicine

## 2019-12-08 ENCOUNTER — Ambulatory Visit (INDEPENDENT_AMBULATORY_CARE_PROVIDER_SITE_OTHER): Payer: No Typology Code available for payment source | Admitting: Family Medicine

## 2019-12-08 ENCOUNTER — Other Ambulatory Visit: Payer: Self-pay

## 2019-12-08 ENCOUNTER — Encounter: Payer: Self-pay | Admitting: Family Medicine

## 2019-12-08 VITALS — Ht 65.0 in

## 2019-12-08 DIAGNOSIS — J0100 Acute maxillary sinusitis, unspecified: Secondary | ICD-10-CM

## 2019-12-08 MED ORDER — FLUTICASONE PROPIONATE 50 MCG/ACT NA SUSP: 2.0000 | Freq: Every day | NASAL | 0 refills | Status: DC

## 2019-12-08 MED ORDER — AMOXICILLIN-POT CLAVULANATE 875-125 MG PO TABS: 1.0000 | ORAL_TABLET | Freq: Two times a day (BID) | ORAL | 0 refills | Status: DC

## 2019-12-08 MED FILL — AMOX-CLAV 875-125 MG TABLET: 875-125 | 7 days supply | Qty: 14 | Fill #0

## 2019-12-08 MED FILL — FLUTICASONE PROP 50 MCG SPR: 50 | 30 days supply | Qty: 16 | Fill #0

## 2019-12-08 NOTE — Progress Notes (Signed)
   VIRTUAL VISIT VIA VIDEO  I connected with Hannah [name] on 12/08/19 at  3:30 PM EST by a video enabled telemedicine application and verified that I am speaking with the correct person using two identifiers. Location patient: Home Location provider: [facility], Office Persons participating in the virtual visit: Patient, Dr. Raoul Pitch and R.Baker, LPN  I discussed the limitations of evaluation and management by telemedicine and the availability of in person appointments. The patient expressed understanding and agreed to proceed.   SUBJECTIVE Chief Complaint  Patient presents with  . Facial Pain    Pt is having sinus pain on right side of face x2 weeks.     HPI: Hannah [name] is a 59 y.o. female present for right sinus pressure of 2 weeks.  Patient points to the right medial aspect of her maxillary sinus cavity as location of pain.  She states sometimes the discomfort will radiate towards her forehead.  She denies any nasal drainage, bloody noses, fever, chills, postnasal drip.  She is having occasional headaches at this location.  She does not feel that she has nasal congestion.  ROS: See pertinent positives and negatives per HPI.  Patient Active Problem List   Diagnosis Date Noted  . Oral thrush 09/10/2019  . Candidal cheilitis 09/10/2019  . Family history of colon cancer - father 44 05/26/2015  . Colon cancer screening 03/10/2015  . Vitamin D deficiency 03/10/2015    Social History   Tobacco Use  . Smoking status: Former Smoker    Years: 2.00    Types: Cigarettes    Quit date: 11/05/1983    Years since quitting: 36.1  . Smokeless tobacco: Never Used  . Tobacco comment: 1/2 pack a week  Substance Use Topics  . Alcohol use: No    Alcohol/week: 0.0 standard drinks    Comment: 1 glass of wine weekly   No current outpatient medications on file.  No Known Allergies  OBJECTIVE: Ht 5\' 5"  (1.651 m)   LMP 06/30/2015   BMI 26.71 kg/m  Gen: No acute distress.  Nontoxic in appearance.  HENT: AT. .  MMM.  Discomfort/fullness present with patient palpation over right maxillary sinus in comparison to left maxillary sinus. Eyes:Pupils Equal Round Reactive to light, Extraocular movements intact,  Conjunctiva without redness, discharge or icterus. Chest: Cough or shortness of breath not present Skin: No rashes, purpura or petechiae.  Neuro:  Normal gait. Alert. Oriented x3  Psych: Normal affect, dress and demeanor. Normal speech. Normal thought content and judgment.  ASSESSMENT AND PLAN: Hannah [name] is a 59 y.o. female present for  Acute non-recurrent maxillary sinusitis Rest, hydrate.  Flonase daily, nettie pot or nasal saline daily.  Flonase daily and Augmentin twice daily x7 days prescribed, take until completed.  If cough present it can last up to 6-8 weeks.  F/U 2 weeks of not improved-would need to see in person for nasal exam.  No orders of the defined types were placed in this encounter.  Meds ordered this encounter  Medications  . amoxicillin-clavulanate (AUGMENTIN) 875-125 MG tablet    Sig: Take 1 tablet by mouth 2 (two) times daily.    Dispense:  14 tablet    Refill:  0  . fluticasone (FLONASE) 50 MCG/ACT nasal spray    Sig: Place 2 sprays into both nostrils daily.    Dispense:  16 g    Refill:  [place], DO 12/08/2019

## 2019-12-08 NOTE — Patient Instructions (Signed)

## 2019-12-15 ENCOUNTER — Emergency Department (HOSPITAL_COMMUNITY): Payer: No Typology Code available for payment source

## 2019-12-15 ENCOUNTER — Observation Stay (HOSPITAL_COMMUNITY)
Admission: EM | Admit: 2019-12-15 | Discharge: 2019-12-16 | Disposition: A | Discharge status: Home / Self Care | Payer: No Typology Code available for payment source | Attending: Internal Medicine | Admitting: Internal Medicine

## 2019-12-15 ENCOUNTER — Other Ambulatory Visit: Payer: Self-pay

## 2019-12-15 ENCOUNTER — Encounter (HOSPITAL_COMMUNITY): Payer: Self-pay | Admitting: Emergency Medicine

## 2019-12-15 DIAGNOSIS — R569 Unspecified convulsions: Secondary | ICD-10-CM

## 2019-12-15 DIAGNOSIS — R4701 Aphasia: Secondary | ICD-10-CM | POA: Diagnosis not present

## 2019-12-15 DIAGNOSIS — G936 Cerebral edema: Secondary | ICD-10-CM | POA: Insufficient documentation

## 2019-12-15 DIAGNOSIS — Z87891 Personal history of nicotine dependence: Secondary | ICD-10-CM | POA: Insufficient documentation

## 2019-12-15 DIAGNOSIS — G9389 Other specified disorders of brain: Secondary | ICD-10-CM

## 2019-12-15 DIAGNOSIS — R531 Weakness: Principal | ICD-10-CM | POA: Insufficient documentation

## 2019-12-15 DIAGNOSIS — E876 Hypokalemia: Secondary | ICD-10-CM | POA: Diagnosis not present

## 2019-12-15 DIAGNOSIS — Z20822 Contact with and (suspected) exposure to covid-19: Secondary | ICD-10-CM | POA: Diagnosis not present

## 2019-12-15 DIAGNOSIS — M4302 Spondylolysis, cervical region: Secondary | ICD-10-CM

## 2019-12-15 HISTORY — DX: Spondylolysis, cervical region: M43.02

## 2019-12-15 LAB — COMPREHENSIVE METABOLIC PANEL
ALT: 18 U/L (ref 0–44)
AST: 24 U/L (ref 15–41)
Albumin: 4.3 g/dL (ref 3.5–5.0)
Alkaline Phosphatase: 60 U/L (ref 38–126)
Anion gap: 13 (ref 5–15)
BUN: 12 mg/dL (ref 6–20)
CO2: 24 mmol/L (ref 22–32)
Calcium: 9.4 mg/dL (ref 8.9–10.3)
Chloride: 104 mmol/L (ref 98–111)
Creatinine, Ser: 0.89 mg/dL (ref 0.44–1.00)
GFR calc Af Amer: >60 mL/min (ref >60)
GFR calc non Af Amer: >60 mL/min (ref >60)
Glucose, Bld: 113 mg/dL — ABNORMAL HIGH (ref 70–99)
Potassium: 3.1 mmol/L — ABNORMAL LOW (ref 3.5–5.1)
Sodium: 141 mmol/L (ref 135–145)
Total Bilirubin: 0.5 mg/dL (ref 0.3–1.2)
Total Protein: 7.1 g/dL (ref 6.5–8.1)

## 2019-12-15 LAB — DIFFERENTIAL
Abs Immature Granulocytes: 0.01 10*3/uL (ref 0.00–0.07)
Basophils Absolute: 0 10*3/uL (ref 0.0–0.1)
Basophils Relative: 0 %
Eosinophils Absolute: 0.1 10*3/uL (ref 0.0–0.5)
Eosinophils Relative: 1 %
Immature Granulocytes: 0 %
Lymphocytes Relative: 43 %
Lymphs Abs: 2.9 10*3/uL (ref 0.7–4.0)
Monocytes Absolute: 0.6 10*3/uL (ref 0.1–1.0)
Monocytes Relative: 10 %
Neutro Abs: 3.1 10*3/uL (ref 1.7–7.7)
Neutrophils Relative %: 46 %

## 2019-12-15 LAB — CBC
HCT: 43.3 % (ref 36.0–46.0)
Hemoglobin: 14.7 g/dL (ref 12.0–15.0)
MCH: 31.3 pg (ref 26.0–34.0)
MCHC: 33.9 g/dL (ref 30.0–36.0)
MCV: 92.1 fL (ref 80.0–100.0)
Platelets: 244 10*3/uL (ref 150–400)
RBC: 4.7 MIL/uL (ref 3.87–5.11)
RDW: 11.6 % (ref 11.5–15.5)
WBC: 6.7 10*3/uL (ref 4.0–10.5)
nRBC: 0 % (ref 0.0–0.2)

## 2019-12-15 LAB — I-STAT CHEM 8, ED
BUN: 14 mg/dL (ref 6–20)
Calcium, Ion: 1.1 mmol/L — ABNORMAL LOW (ref 1.15–1.40)
Chloride: 103 mmol/L (ref 98–111)
Creatinine, Ser: 0.9 mg/dL (ref 0.44–1.00)
Glucose, Bld: 107 mg/dL — ABNORMAL HIGH (ref 70–99)
HCT: 43 % (ref 36.0–46.0)
Hemoglobin: 14.6 g/dL (ref 12.0–15.0)
Potassium: 2.9 mmol/L — ABNORMAL LOW (ref 3.5–5.1)
Sodium: 140 mmol/L (ref 135–145)
TCO2: 27 mmol/L (ref 22–32)

## 2019-12-15 LAB — I-STAT BETA HCG BLOOD, ED (MC, WL, AP ONLY): I-stat hCG, quantitative: <5 m[IU]/mL (ref <5)

## 2019-12-15 LAB — APTT: aPTT: 33 seconds (ref 24–36)

## 2019-12-15 LAB — ETHANOL: Alcohol, Ethyl (B): <10 mg/dL (ref <10)

## 2019-12-15 LAB — RESPIRATORY PANEL BY RT PCR (FLU A&B, COVID)
Influenza A by PCR: NEGATIVE
Influenza B by PCR: NEGATIVE
SARS Coronavirus 2 by RT PCR: NEGATIVE

## 2019-12-15 LAB — PROTIME-INR
INR: 1 (ref 0.8–1.2)
Prothrombin Time: 12.6 seconds (ref 11.4–15.2)

## 2019-12-15 MED ORDER — POTASSIUM CHLORIDE 10 MEQ/100ML IV SOLN
10.0000 meq | Freq: Once | INTRAVENOUS | Status: AC
  Administered 2019-12-15: 10 meq via INTRAVENOUS
  Filled 2019-12-15: qty 100

## 2019-12-15 MED ORDER — DEXAMETHASONE SODIUM PHOSPHATE 10 MG/ML IJ SOLN
10.0000 mg | Freq: Once | INTRAMUSCULAR | Status: AC
  Administered 2019-12-15: 10 mg via INTRAVENOUS
  Filled 2019-12-15: qty 1

## 2019-12-15 MED ORDER — IOHEXOL 350 MG/ML SOLN
100.0000 mL | Freq: Once | INTRAVENOUS | Status: AC | PRN
  Administered 2019-12-15: 21:00:00 100 mL via INTRAVENOUS

## 2019-12-15 MED ORDER — GADOBUTROL 1 MMOL/ML IV SOLN
7.0000 mL | Freq: Once | INTRAVENOUS | Status: AC | PRN
  Administered 2019-12-15: 22:00:00 7 mL via INTRAVENOUS

## 2019-12-15 MED ORDER — LEVETIRACETAM IN NACL 1500 MG/100ML IV SOLN
1500.0000 mg | Freq: Once | INTRAVENOUS | Status: AC
  Administered 2019-12-15: 1500 mg via INTRAVENOUS
  Filled 2019-12-15: qty 100

## 2019-12-15 NOTE — ED Notes (Signed)
Contacted pt's husband to give update; no answer left message for husband to call back.

## 2019-12-15 NOTE — ED Notes (Signed)
To mri 

## 2019-12-15 NOTE — ED Notes (Signed)
Dr Margretta Ditty has talked to the pt and her husband.  She reports better now  Than she has felt all night

## 2019-12-15 NOTE — ED Triage Notes (Signed)
Pt reports she developed expressive aphasia and right arm weakness. Pt reports symptoms started around [place]. Dr. Darl Householder present, code stroke activated.

## 2019-12-15 NOTE — ED Notes (Signed)
Anslie Wecker, pt's husband [phone] call for updates

## 2019-12-15 NOTE — Consult Note (Signed)
Neurology Consultation  Reason for Consult: Code stroke for right-sided weakness and word finding difficulty Referring Physician: Dr. Darl Householder  CC: Right-sided weakness, word finding difficulty  History is obtained from: Patient, chart  HPI: Hannah [name] is a 59 y.o. female with no significant past medical history, presented via private vehicle which she drove herself for evaluation of right-sided weakness and some overlying difficulty that started suddenly around 7:30 PM today while she was playing tennis. She reports having had some headaches and was diagnosed with recurrent sinusitis last week, and today was playing tennis when she suddenly noted that she could not return return a serve her right arm would not move as well as she would like to.  She also noticed some difficulty with the right leg.  Upon trying to speak, she felt that she had trouble talking.  All the symptoms made her worried, and she drove herself to the hospital.  She reported that her symptoms have been getting somewhat better and then got worse again.   She was seen at triage and a code stroke was activated for possible LVO stroke due to right-sided weakness and some evidence of aphasia on exam. She was taken for stat CT head-see details below.  LKW: 7:30 PM on 12/15/2019 tpa given?: no, possible bleed/convexity subarachnoid hemorrhage  versus mass n the left frontal area Premorbid modified Rankin scale (mRS): 0   ROS: Unable to reliably obtained due to aphasia  Past Medical History:  Diagnosis Date  . Left ankle sprain 10/07/2011     Family History  Problem Relation Age of Onset  . Hypertension Mother   . Stroke Mother   . Diabetes Mother   . Cancer Father 67       colon  . Hypertension Sister   . Colon polyps Sister   . Heart disease Brother 55  . Diabetes Maternal Uncle   . Diabetes Maternal Uncle    Social History:   reports that she quit smoking about 36 years ago. Her smoking use included  cigarettes. She quit after 2.00 years of use. She has never used smokeless tobacco. She reports that she does not drink alcohol or use drugs.  Medications  Current Facility-Administered Medications:  .  levETIRAcetam (KEPPRA) IVPB 1500 mg/ 100 mL premix, 1,500 mg, Intravenous, Once, Drenda Freeze, MD .  potassium chloride 10 mEq in 100 mL IVPB, 10 mEq, Intravenous, Once, Drenda Freeze, MD  Current Outpatient Medications:  .  amoxicillin-clavulanate (AUGMENTIN) 875-125 MG tablet, Take 1 tablet by mouth 2 (two) times daily., Disp: 14 tablet, Rfl: 0 .  fluticasone (FLONASE) 50 MCG/ACT nasal spray, Place 2 sprays into both nostrils daily., Disp: 16 g, Rfl: 0  Exam: Current vital signs: BP (!) 158/95 (BP Location: Right Arm)   Pulse 89   Temp 98.4 F (36.9 C) (Oral)   Resp 18   LMP 06/30/2015   SpO2 99%  Vital signs in last 24 hours: Temp:  [98.4 F (36.9 C)] 98.4 F (36.9 C) (02/10 2126) Pulse Rate:  [89] 89 (02/10 2126) Resp:  [18] 18 (02/10 2126) BP: (158)/(95) 158/95 (02/10 2126) SpO2:  [99 %] 99 % (02/10 2126) General: She is awake alert in no distress but appears a little anxious. HEENT: Normocephalic atraumatic Lungs: Clear to auscultation Cardiovascular: Regular rate rhythm Extremities warm well perfused Neurological exam Awake alert oriented to self. Was able to tell me why she has but had significant word finding difficulty. Naming was intact initially but then had  difficulty naming later on. Comprehension and repetition initially intact but then worsened. Speech is not dysarthric Cranial: Pupils equal round react light, extraocular movements intact, visual fields full, facial sensation intact, face symmetric, articulate intact, tongue and palate midline, shoulder shrug intact. Motor exam: No vertical drift in any of the 4 extremities with the right leg is subtly weak in comparison to the left. Sensory exam: Intact to light touch all over Coordination: No  dysmetria Gait testing deferred at this time NIH stroke scale 1a Level of Conscious.: 0 1b LOC Questions: 0 1c LOC Commands: 0 2 Best Gaze: 0 3 Visual: 0 4 Facial Palsy: 0 5a Motor Arm - left: 0 5b Motor Arm - Right: 0 6a Motor Leg - Left: 0 6b Motor Leg - Right: 0 7 Limb Ataxia: 0 8 Sensory: 0 9 Best Language: 1 10 Dysarthria: 0 11 Extinct. and Inatten.: 0 TOTAL: 1   Labs I have reviewed labs in epic and the results pertinent to this consultation are:  CBC    Component Value Date/Time   WBC 6.7 12/15/2019 2056   RBC 4.70 12/15/2019 2056   HGB 14.6 12/15/2019 2114   HCT 43.0 12/15/2019 2114   PLT 244 12/15/2019 2056   MCV 92.1 12/15/2019 2056   MCH 31.3 12/15/2019 2056   MCHC 33.9 12/15/2019 2056   RDW 11.6 12/15/2019 2056   LYMPHSABS 2.9 12/15/2019 2056   MONOABS 0.6 12/15/2019 2056   EOSABS 0.1 12/15/2019 2056   BASOSABS 0.0 12/15/2019 2056    CMP     Component Value Date/Time   NA 140 12/15/2019 2114   K 2.9 (L) 12/15/2019 2114   CL 103 12/15/2019 2114   CO2 31 02/27/2015 0818   GLUCOSE 107 (H) 12/15/2019 2114   GLUCOSE 85 10/13/2006 1158   BUN 14 12/15/2019 2114   CREATININE 0.90 12/15/2019 2114   CALCIUM 9.8 02/27/2015 0818   PROT 7.0 02/27/2015 0818   ALBUMIN 4.5 02/27/2015 0818   AST 18 02/27/2015 0818   ALT 12 02/27/2015 0818   ALKPHOS 61 02/27/2015 0818   BILITOT 0.7 02/27/2015 0818   GFRNONAA 82 10/13/2006 1158    Imaging I have reviewed the images obtained:  CT-scan of the brain-subtle hyperdensity in the left frontal lobe. Official read below: IMPRESSION: 1. Area of increased density at the left frontoparietal vertex adjacent to the midline suspicious for some subarachnoid blood in the sulci of the region. Cannot rule out the possibility of infarction with petechial hemorrhage. Mass lesion is conceivable but least likely. 2. ASPECTS is 10 3. These results were communicated to Dr. Rory Percy at 9:04 pmon 2/10/2021by direct phone  conversation.  CT angiogram head and neck, CT venogram-with no emergent large vessel occlusion.  On the venous phase, there is a question of somewhat abnormal filling defects around the superior sagittal sinus-with questinable partial thrombosis of a cortical verin and questionably in the left transverse sinus-discussed with neuroradiology-awaiting formal reading.  FORMAL CTA/CTV read: CTA/P IMPRESSION: -No arterial disease demonstrated. -At the left frontoparietal vertex, the area which is abnormal on CT shows elevated CBV and CBF. This could be seen with luxury perfusion, cortical venous thrombosis or tumor. -Evaluation of the veins is actually best accomplished at the CT angiogram. There is questionably an abnormal vein overlying the area of abnormal brain, possibly with partial thrombosis. I do not think this is conclusive, but the findings are suspicious as detailed in the body of the report.  CTV: IMPRESSION: -Venous detail is actually better seen  on the arterial study. With the benefit of that study, there continues to be a suspicious cortical vein at the left parietal vertex which could be partially thrombosed. The superior sagittal sinus is patent. No other venous pathology is seen.  MRI brain with without contrast FINDINGS: Brain: The study shows a mass lesion of the left posterior frontal lobe, probably involving the brain anterior to the precentral gyrus. Mass lesion measures approximately 3 cm in diameter. At the extreme vertex, there is a portion of the lesion that measures about 2 cm in diameter that behaves differently than other portions of the mass lesion. This includes an indistinct region of contrast enhancement measuring up to 1 cm in size. 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 I do not think we are dealing with either arterial or  venous infarction in this case and the MRI scan does not show any evidence of hemorrhage. What was initially interpreted as subarachnoid hemorrhage was in fact density due to the tumor.  Elsewhere, there are mild to moderate chronic small-vessel ischemic changes affecting the cerebral hemispheric white matter. No hydrocephalus or extra-axial collection.  Vascular: Major vessels at the base of the brain show flow. No suspicion of venous thrombosis disease, either at the vertex affecting the cortical veins or of the left transverse sinus.  Skull and upper cervical spine: Negative  Sinuses/Orbits: Clear/normal  Other: None IMPRESSION: Approximately 3 cm in size mass lesion at the left posterior frontal vertex affecting the brain anterior to the precentral gyrus. Within the approximately 3 cm mass, there is a 2 cm region that behaves differently, with more mass effect and with a 1 cm region of indistinct enhancement. 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 See above for full discussion.  Assessment: This is a 59 year old woman, with no significant past medical history presenting with sudden onset of intermittent right-sided weakness and difficulty with speech-mostly word finding difficulty and expressive aphasia. Noncontrast CT head done due to concern of stroke showed a hyperdensity in the left frontal area-question subarachnoid hemorrhage versus mass.  Follow-up with vessel imaging-CTA and CTV with no emergent large vessel occlusion in the arterial structures but questionable findings of possible dural vein thrombosis. 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  No dural venous sinus thrombosis.  Current presentation likely related to cortical irritation/subclinical seizures or  direct cerebral edema caused by the mass above.  Impression: Brain tumor-see above Cerebral edema Possible seizure  Recommendations: -Admit to telemetry -Keppra 1500x1 followed by 500 twice daily. -Dexamethasone 10 mg IV x1 followed by 4 mg every 6 hours. -Routine EEG in the morning -Discuss with neuro oncology in the morning for further management-biopsy versus other investigative modalities. -Consider nonemergent neurosurgical consultation. -Maintain seizure precautions Discussed findings with ED provider Dr. Darl Householder, admitting Dr. Linda Hedges and the patient's husband and the patient at bedside.  -- Amie Portland, MD [facility] Pager: [phone] If 7pm to 7am, please call on call as listed on AMION.

## 2019-12-15 NOTE — ED Notes (Signed)
Yuhan Morsey husband [phone] looking for an update on the patient

## 2019-12-15 NOTE — ED Notes (Signed)
Pt returned from mri alert  No pain

## 2019-12-15 NOTE — ED Provider Notes (Signed)
[facility] EMERGENCY DEPARTMENT Provider Note   CSN: [id number] Arrival date & time: 12/15/19  2043     History Chief Complaint  Patient presents with  . Aphasia    Ernestene A Gholson is a 59 y.o. female history of oral thrush here presenting with aphasia, right arm weakness.  Patient states that she was playing tennis and around 7:30 PM, she had acute onset of trouble speaking.  She states that she could not get words out.  She also noticed right arm weakness as well.  She denies any trauma or head injury.  No history of stroke in the past.  She states that her speech has improved since then.  Her right arm still feels heavy and weak however.  The history is provided by the patient.       Past Medical History:  Diagnosis Date  . Left ankle sprain 10/07/2011    Patient Active Problem List   Diagnosis Date Noted  . Oral thrush 09/10/2019  . Candidal cheilitis 09/10/2019  . Family history of colon cancer - father 20 05/26/2015  . Colon cancer screening 03/10/2015  . Vitamin D deficiency 03/10/2015    Past Surgical History:  Procedure Laterality Date  . FOOT SURGERY Left    bone spur  . TONSILECTOMY/ADENOIDECTOMY WITH MYRINGOTOMY    . WISDOM TOOTH EXTRACTION       OB History   No obstetric history on file.     Family History  Problem Relation Age of Onset  . Hypertension Mother   . Stroke Mother   . Diabetes Mother   . Cancer Father 9       colon  . Hypertension Sister   . Colon polyps Sister   . Heart disease Brother 11  . Diabetes Maternal Uncle   . Diabetes Maternal Uncle     Social History   Tobacco Use  . Smoking status: Former Smoker    Years: 2.00    Types: Cigarettes    Quit date: 11/05/1983    Years since quitting: 36.1  . Smokeless tobacco: Never Used  . Tobacco comment: 1/2 pack a week  Substance Use Topics  . Alcohol use: No    Alcohol/week: 0.0 standard drinks    Comment: 1 glass of wine weekly  . Drug use: No     Home Medications Prior to Admission medications   Medication Sig Start Date End Date Taking? Authorizing Provider  amoxicillin-clavulanate (AUGMENTIN) 875-125 MG tablet Take 1 tablet by mouth 2 (two) times daily. 12/08/19   Kuneff, Renee A, DO  fluticasone (FLONASE) 50 MCG/ACT nasal spray Place 2 sprays into both nostrils daily. 12/08/19   Kuneff, Renee A, DO    Allergies    Patient has no known allergies.  Review of Systems   Review of Systems  Neurological: Positive for speech difficulty and weakness.  All other systems reviewed and are negative.   Physical Exam Updated Vital Signs LMP 06/30/2015   Physical Exam Vitals and nursing note reviewed.  Constitutional:      Comments: Some word finding difficulties   HENT:     Head: Normocephalic.     Nose: Nose normal.     Mouth/Throat:     Mouth: Mucous membranes are moist.  Eyes:     Extraocular Movements: Extraocular movements intact.     Pupils: Pupils are equal, round, and reactive to light.  Cardiovascular:     Rate and Rhythm: Normal rate and regular rhythm.  Pulses: Normal pulses.     Heart sounds: Normal heart sounds.  Pulmonary:     Effort: Pulmonary effort is normal.     Breath sounds: Normal breath sounds.  Abdominal:     General: Abdomen is flat.     Palpations: Abdomen is soft.  Musculoskeletal:        General: Normal range of motion.     Cervical back: Normal range of motion.  Skin:    General: Skin is warm.  Neurological:     Comments: Some expressive aphasia, strength 4/5 R arm, 5/5 R leg and 5/5 L arm and leg   Psychiatric:        Behavior: Behavior normal.     ED Results / Procedures / Treatments   Labs (all labs ordered are listed, but only abnormal results are displayed) Labs Reviewed  CBC  DIFFERENTIAL  ETHANOL  PROTIME-INR  APTT  COMPREHENSIVE METABOLIC PANEL  RAPID URINE DRUG SCREEN, HOSP PERFORMED  URINALYSIS, ROUTINE W REFLEX MICROSCOPIC  I-STAT CHEM 8, ED  I-STAT BETA HCG  BLOOD, ED ([facility], [facility], [facility] ONLY)    EKG None  Radiology CT HEAD CODE STROKE WO CONTRAST  Result Date: 12/15/2019 CLINICAL DATA:  Code stroke. Expressive aphasia. Right-sided numbness. EXAM: CT HEAD WITHOUT CONTRAST TECHNIQUE: Contiguous axial images were obtained from the base of the skull through the vertex without intravenous contrast. COMPARISON:  None. FINDINGS: Brain: At the left frontoparietal vertex, there is an area of increased density favored to represent some subarachnoid blood within the sulci. Cannot rule out the possibility infarction with hemorrhage. The remainder the brain is normal by CT. No evidence of old infarction. No mass, hydrocephalus or extra-axial collection. Vascular: No abnormal vascular finding at the base of the brain. Skull: Normal Sinuses/Orbits: Normal Other: None ASPECTS ([place] Stroke Program Early CT Score) - Ganglionic level infarction (caudate, lentiform nuclei, internal capsule, insula, M1-M3 cortex): 7 - Supraganglionic infarction (M4-M6 cortex): 3 Total score (0-10 with 10 being normal): 10 IMPRESSION: 1. Area of increased density at the left frontoparietal vertex adjacent to the midline suspicious for some subarachnoid blood in the sulci of the region. Cannot rule out the possibility of infarction with petechial hemorrhage. Mass lesion is conceivable but least likely. 2. ASPECTS is 10 3. These results were communicated to Dr. Rory Percy at 9:04 pmon 2/10/2021by direct phone conversation. Electronically Signed   By: Nelson Chimes M.D.   On: 12/15/2019 21:09    Procedures Procedures (including critical care time)  Medications Ordered in ED Medications  iohexol (OMNIPAQUE) 350 MG/ML injection 100 mL (100 mLs Intravenous Contrast Given 12/15/19 2108)    ED Course  I have reviewed the triage vital signs and the nursing notes.  Pertinent labs & imaging results that were available during my care of the patient were reviewed by me and considered in my medical decision  making (see chart for details).    MDM Rules/Calculators/A&P                      Hara A Bogart is a 59 y.o. female here presenting with expressive aphasia, right arm weakness.  She is within TPA window and does still have some expressive aphasia so code stroke was activated.  Dr. Rory Percy notified. Will get labs, CT head.   10:29 PM CT showed possible subarachnoid hemorrhage. Also concerned for possible venous dural thrombosis. CTA and perfusion CTV performed and showed possible mass . MRI confirmed a mass. Neurology is concerned that she may  have had a seizure and recommend keppra loading and admission. He will contact neuro oncology (Dr. Mickeal Skinner) in the morning to consider radiation vs surgery.     Final Clinical Impression(s) / ED Diagnoses Final diagnoses:  None    Rx / DC Orders ED Discharge Orders    None       Drenda Freeze, MD 12/15/19 2247

## 2019-12-16 ENCOUNTER — Encounter (HOSPITAL_COMMUNITY): Payer: Self-pay | Admitting: Internal Medicine

## 2019-12-16 DIAGNOSIS — R569 Unspecified convulsions: Secondary | ICD-10-CM

## 2019-12-16 DIAGNOSIS — G9389 Other specified disorders of brain: Secondary | ICD-10-CM | POA: Diagnosis not present

## 2019-12-16 DIAGNOSIS — R531 Weakness: Secondary | ICD-10-CM | POA: Diagnosis not present

## 2019-12-16 LAB — URINALYSIS, ROUTINE W REFLEX MICROSCOPIC
Bilirubin Urine: NEGATIVE
Glucose, UA: NEGATIVE mg/dL
Hgb urine dipstick: NEGATIVE
Ketones, ur: NEGATIVE mg/dL
Leukocytes,Ua: NEGATIVE
Nitrite: NEGATIVE
Protein, ur: NEGATIVE mg/dL
Specific Gravity, Urine: 1.025 (ref 1.005–1.030)
pH: 7 (ref 5.0–8.0)

## 2019-12-16 LAB — BASIC METABOLIC PANEL
Anion gap: 15 (ref 5–15)
BUN: 10 mg/dL (ref 6–20)
CO2: 22 mmol/L (ref 22–32)
Calcium: 9.8 mg/dL (ref 8.9–10.3)
Chloride: 105 mmol/L (ref 98–111)
Creatinine, Ser: 0.75 mg/dL (ref 0.44–1.00)
GFR calc Af Amer: >60 mL/min (ref >60)
GFR calc non Af Amer: >60 mL/min (ref >60)
Glucose, Bld: 151 mg/dL — ABNORMAL HIGH (ref 70–99)
Potassium: 4.1 mmol/L (ref 3.5–5.1)
Sodium: 142 mmol/L (ref 135–145)

## 2019-12-16 LAB — HIV ANTIBODY (ROUTINE TESTING W REFLEX): HIV Screen 4th Generation wRfx: NONREACTIVE

## 2019-12-16 LAB — RAPID URINE DRUG SCREEN, HOSP PERFORMED
Amphetamines: NOT DETECTED
Barbiturates: NOT DETECTED
Benzodiazepines: NOT DETECTED
Cocaine: NOT DETECTED
Opiates: NOT DETECTED
Tetrahydrocannabinol: NOT DETECTED

## 2019-12-16 LAB — MAGNESIUM: Magnesium: 2.1 mg/dL (ref 1.7–2.4)

## 2019-12-16 MED ORDER — ACETAMINOPHEN 650 MG RE SUPP: 650.0000 mg | Freq: Four times a day (QID) | RECTAL | Status: DC | PRN

## 2019-12-16 MED ORDER — LEVETIRACETAM IN NACL 500 MG/100ML IV SOLN
500.0000 mg | Freq: Two times a day (BID) | INTRAVENOUS | Status: DC
  Administered 2019-12-16: 500 mg via INTRAVENOUS
  Filled 2019-12-16: qty 100

## 2019-12-16 MED ORDER — ACETAMINOPHEN 325 MG PO TABS: 650.0000 mg | ORAL_TABLET | Freq: Four times a day (QID) | ORAL | Status: DC | PRN

## 2019-12-16 MED ORDER — SODIUM CHLORIDE 0.9% FLUSH: 3.0000 mL | INTRAVENOUS | Status: DC | PRN

## 2019-12-16 MED ORDER — SODIUM CHLORIDE 0.9 % IV SOLN: 250.0000 mL | INTRAVENOUS | Status: DC | PRN

## 2019-12-16 MED ORDER — ENOXAPARIN SODIUM 40 MG/0.4ML ~~LOC~~ SOLN: 40.0000 mg | Freq: Every day | SUBCUTANEOUS | Status: DC

## 2019-12-16 MED ORDER — LEVETIRACETAM 500 MG PO TABS
500.0000 mg | ORAL_TABLET | Freq: Two times a day (BID) | ORAL | Status: DC
  Administered 2019-12-16: 500 mg via ORAL
  Filled 2019-12-16: qty 1

## 2019-12-16 MED ORDER — TRAMADOL HCL 50 MG PO TABS: 50.0000 mg | ORAL_TABLET | Freq: Four times a day (QID) | ORAL | Status: DC | PRN

## 2019-12-16 MED ORDER — LEVETIRACETAM 500 MG PO TABS: 500.0000 mg | ORAL_TABLET | Freq: Once | ORAL | Status: DC

## 2019-12-16 MED ORDER — SODIUM CHLORIDE 0.9% FLUSH
3.0000 mL | Freq: Two times a day (BID) | INTRAVENOUS | Status: DC
  Administered 2019-12-16: 3 mL via INTRAVENOUS

## 2019-12-16 MED ORDER — LEVETIRACETAM 500 MG PO TABS: 500.0000 mg | ORAL_TABLET | Freq: Two times a day (BID) | ORAL | 1 refills | Status: DC

## 2019-12-16 MED ORDER — DEXAMETHASONE SODIUM PHOSPHATE 4 MG/ML IJ SOLN
4.0000 mg | Freq: Four times a day (QID) | INTRAMUSCULAR | Status: DC
  Administered 2019-12-16 (×3): 4 mg via INTRAVENOUS
  Filled 2019-12-16 (×3): qty 1

## 2019-12-16 MED FILL — levETIRAcetam 500 MG TABS: 500 | 30 days supply | Qty: 60 | Fill #0

## 2019-12-16 NOTE — ED Notes (Signed)
Ordered diet tray for pt  

## 2019-12-16 NOTE — Discharge Summary (Signed)
Physician Discharge Summary  Hannah [name] [id number] DOB: 1961/04/11  PCP: [name], DO  Admitted from: Home Discharged to: Home  Admit date: 12/15/2019 Discharge date: 12/16/2019  Recommendations for Outpatient Follow-up:   Follow-up Information    Kuneff, Renee A, DO. Schedule an appointment as soon as possible for a visit in 1 week(s).   Specialty: Family Medicine Why: To be seen with repeat labs (CBC & BMP). Contact information: [address] [place] [address] [phone]        [name], MD Follow up.   Specialties: Psychiatry, Neurology, Oncology Why: MDs office will arrange outpatient follow-up visit. 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  She knew what she wanted to say to her tennis colleagues but all she kept repeating was "the, the, the".  She also felt like that her right upper extremity was not moving as well, felt like it was going to jerk and was consciously trying to stop it from jerking.  However she denies witnessing any twitching or involuntary jerking movements.  She says she was "embarrassed" and drove herself to the [facility] ED.  A stroke code was initially called and Neurology was consulted.  After extensive imaging studies, it was determined that she did not have a stroke, brain bleed or  venous thrombosis but confirmed a left frontal mass.  Neurology and Neuro-Oncology consulted.  Assessment and plan:  1. Left frontal lobe brain mass: Detailed imaging studies as noted below.  Neurology and Neuro-oncology consulted.  She was started on Decadron and Keppra in the hospital.  Dr. 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  I discussed with him in detail.  He has cleared her for discharge home.  He will arrange outpatient follow-up with repeat MRI brain with and without contrast in 1 month, mainly to rule out early high-grade glioma, no need for biopsy at this time, no need for steroids, continue Keppra for suspected focal seizures, driving restrictions counseled and patient's case will be discussed later this week at the brain/spine tumor board. 2. 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  Started on Keppra.  Continue Keppra.  Multiple providers including I have discussed in detail with patient regarding driving and other restrictions as noted below.  She verbalizes understanding.  Dr. Mickeal Skinner will arrange outpatient EEG. 3. Hypokalemia: Replaced.  Magnesium normal   Consultations:  Neuro Hospitalist  Neuro Oncologist  Procedures:  None   Discharge Instructions  Discharge Instructions    Call MD for:   Complete by: As directed    Strokelike activity or seizure-like activity.   Call MD for:  extreme fatigue   Complete by: As directed    Call MD for:  persistant dizziness or light-headedness   Complete by: As directed  Call MD for:  persistant nausea and vomiting   Complete by: As directed    Call MD for:  severe uncontrolled pain   Complete by: As directed    Diet - low sodium heart healthy   Complete by: As directed    Driving Restrictions   Complete by: As directed    Patient is unable to drive, operate heavy  machinery, perform activities at heights or participate in water activities until release by outpatient physician. This was discussed with the patient who expressed understanding.   Increase activity slowly   Complete by: As directed        Medication List    TAKE these medications   amoxicillin-clavulanate 875-125 MG tablet Commonly known as: AUGMENTIN Take 1 tablet by mouth 2 (two) times daily.   fluticasone 50 MCG/ACT nasal spray Commonly known as: FLONASE Place 2 sprays into both nostrils daily. What changed:   when to take this  reasons to take this   levETIRAcetam 500 MG tablet Commonly known as: KEPPRA Take 1 tablet (500 mg total) by mouth 2 (two) times daily. Start taking on: December 17, 2019      No Known Allergies    Procedures/Studies: CT Angio Head W or Wo Contrast  Result Date: 12/15/2019 CLINICAL DATA:  Acute presentation with right arm weakness. Abnormal head CT. EXAM: CT ANGIOGRAPHY HEAD AND NECK CT PERFUSION BRAIN TECHNIQUE: Multidetector CT imaging of the head and neck was performed using the standard protocol during bolus administration of intravenous contrast. Multiplanar CT image reconstructions and MIPs were obtained to evaluate the vascular anatomy. Carotid stenosis measurements (when applicable) are obtained utilizing NASCET criteria, using the distal internal carotid diameter as the denominator. Multiphase CT imaging of the brain was performed following IV bolus contrast injection. Subsequent parametric perfusion maps were calculated using RAPID software. CONTRAST:  136mL OMNIPAQUE IOHEXOL 350 MG/ML SOLN COMPARISON:  Head CT earlier same day FINDINGS: CTA NECK FINDINGS Aortic arch: Mild aortic atherosclerosis. Branching pattern is normal without origin stenosis. Right carotid system: Common carotid artery widely patent to the bifurcation. Carotid bifurcation is normal without soft or calcified plaque. Cervical ICA is normal. Left carotid system: Common  carotid artery widely patent to the bifurcation. Carotid bifurcation is normal without soft or calcified plaque. Cervical ICA is normal. Vertebral arteries: Both vertebral artery origins are normal. Both vertebral arteries are normal through the cervical region to the foramen magnum. Skeleton: Mild cervical spondylosis C6-7. Other neck: No mass or lymphadenopathy. Upper chest: Normal Review of the MIP images confirms the above findings CTA HEAD FINDINGS Anterior circulation: Both internal carotid arteries are widely patent through the skull base and siphon regions. No siphon stenosis. The anterior and middle cerebral vessels are normal. 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 Both anterior cerebral is a receive there supply from the left carotid circulation. Fetal origin of the right PCA from the right carotid. The anterior and middle cerebral vessels opacified normally. No visible large or medium vessel occlusion. Posterior circulation: Both vertebral arteries are widely patent through the foramen magnum to the basilar. No basilar stenosis. Both posterior cerebral arteries are patent, with the right receiving it supply from the anterior circulation as noted above. Venous sinuses: Major venous structures are patent. At the left vertex, immediately adjacent to the CT abnormality, there is a vein which appears to show a tram track sign. Question if this could be thrombosis of a superficial cortical vein, resulting in venous infarction. This vein is marked with arrows  on series 5. Relevant coronal image marked on series number 6. Anatomic variants: None other significant. Review of the MIP images confirms the above findings CT Brain Perfusion Findings: ASPECTS: 10 CBF (<30%) Volume: 75mL Perfusion (Tmax>6.0s) volume: 74mL Mismatch Volume: 49mL Infarction Location:None There is actually increased CBV and CBF in the area of the imaging abnormality at the left frontoparietal vertex. This could be seen either with lungs  reperfusion from previous arterial infarction, venous infarction with plethora, or tumor. IMPRESSION: No arterial disease demonstrated. At the left frontoparietal vertex, the area which is abnormal on CT shows elevated CBV and CBF. This could be seen with luxury perfusion, cortical venous thrombosis or tumor. Evaluation of the veins is actually best accomplished at the CT angiogram. There is questionably an abnormal vein overlying the area of abnormal brain, possibly with partial thrombosis. I do not think this is conclusive, but the findings are suspicious as detailed in the body of the report. Electronically Signed   By: Nelson Chimes M.D.   On: 12/15/2019 21:49   CT Angio Neck W and/or Wo Contrast  Result Date: 12/15/2019 CLINICAL DATA:  Acute presentation with right arm weakness. Abnormal head CT. EXAM: CT ANGIOGRAPHY HEAD AND NECK CT PERFUSION BRAIN TECHNIQUE: Multidetector CT imaging of the head and neck was performed using the standard protocol during bolus administration of intravenous contrast. Multiplanar CT image reconstructions and MIPs were obtained to evaluate the vascular anatomy. Carotid stenosis measurements (when applicable) are obtained utilizing NASCET criteria, using the distal internal carotid diameter as the denominator. Multiphase CT imaging of the brain was performed following IV bolus contrast injection. Subsequent parametric perfusion maps were calculated using RAPID software. CONTRAST:  183mL OMNIPAQUE IOHEXOL 350 MG/ML SOLN COMPARISON:  Head CT earlier same day FINDINGS: CTA NECK FINDINGS Aortic arch: Mild aortic atherosclerosis. Branching pattern is normal without origin stenosis. Right carotid system: Common carotid artery widely patent to the bifurcation. Carotid bifurcation is normal without soft or calcified plaque. Cervical ICA is normal. Left carotid system: Common carotid artery widely patent to the bifurcation. Carotid bifurcation is normal without soft or calcified plaque.  Cervical ICA is normal. Vertebral arteries: Both vertebral artery origins are normal. Both vertebral arteries are normal through the cervical region to the foramen magnum. Skeleton: Mild cervical spondylosis C6-7. Other neck: No mass or lymphadenopathy. Upper chest: Normal Review of the MIP images confirms the above findings CTA HEAD FINDINGS Anterior circulation: Both internal carotid arteries are widely patent through the skull base and siphon regions. No siphon stenosis. The anterior and middle cerebral vessels are normal. 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 Both anterior cerebral is a receive there supply from the left carotid circulation. Fetal origin of the right PCA from the right carotid. The anterior and middle cerebral vessels opacified normally. No visible large or medium vessel occlusion. Posterior circulation: Both vertebral arteries are widely patent through the foramen magnum to the basilar. No basilar stenosis. Both posterior cerebral arteries are patent, with the right receiving it supply from the anterior circulation as noted above. Venous sinuses: Major venous structures are patent. At the left vertex, immediately adjacent to the CT abnormality, there is a vein which appears to show a tram track sign. Question if this could be thrombosis of a superficial cortical vein, resulting in venous infarction. This vein is marked with arrows on series 5. Relevant coronal image marked on series number 6. Anatomic variants: None other significant. Review of the MIP images confirms the above findings CT Brain  Perfusion Findings: ASPECTS: 10 CBF (<30%) Volume: 71mL Perfusion (Tmax>6.0s) volume: 79mL Mismatch Volume: 52mL Infarction Location:None There is actually increased CBV and CBF in the area of the imaging abnormality at the left frontoparietal vertex. This could be seen either with lungs reperfusion from previous arterial infarction, venous infarction with plethora, or tumor. IMPRESSION: No arterial disease  demonstrated. At the left frontoparietal vertex, the area which is abnormal on CT shows elevated CBV and CBF. This could be seen with luxury perfusion, cortical venous thrombosis or tumor. Evaluation of the veins is actually best accomplished at the CT angiogram. There is questionably an abnormal vein overlying the area of abnormal brain, possibly with partial thrombosis. I do not think this is conclusive, but the findings are suspicious as detailed in the body of the report. Electronically Signed   By: Nelson Chimes M.D.   On: 12/15/2019 21:49   MR Brain W and Wo Contrast  Result Date: 12/15/2019 CLINICAL DATA:  Headache. Abnormal CT studies. Subarachnoid hemorrhage suspected. Expressive aphasia and right arm weakness developed while playing tennis. EXAM: MRI HEAD WITHOUT AND WITH CONTRAST TECHNIQUE: Multiplanar, multiecho pulse sequences of the brain and surrounding structures were obtained without and with intravenous contrast. CONTRAST:  11mL GADAVIST GADOBUTROL 1 MMOL/ML IV SOLN COMPARISON:  CT studies earlier same day. FINDINGS: Brain: The study shows a mass lesion of the left posterior frontal lobe, probably involving the brain anterior to the precentral gyrus. Mass lesion measures approximately 3 cm in diameter. At the extreme vertex, there is a portion of the lesion that measures about 2 cm in diameter that behaves differently than other portions of the mass lesion. This includes an indistinct region of contrast enhancement measuring up to 1 cm in size. 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 I do not think we are dealing with either arterial or venous infarction in this case and the MRI scan does not show any evidence of hemorrhage. What was initially interpreted as subarachnoid hemorrhage was in fact density due to the tumor. Elsewhere, there are mild to moderate chronic  small-vessel ischemic changes affecting the cerebral hemispheric white matter. No hydrocephalus or extra-axial collection. Vascular: Major vessels at the base of the brain show flow. No suspicion of venous thrombosis disease, either at the vertex affecting the cortical veins or of the left transverse sinus. Skull and upper cervical spine: Negative Sinuses/Orbits: Clear/normal Other: None IMPRESSION: Approximately 3 cm in size mass lesion at the left posterior frontal vertex affecting the brain anterior to the precentral gyrus. Within the approximately 3 cm mass, there is a 2 cm region that behaves differently, with more mass effect and with a 1 cm region of indistinct enhancement. 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 See above for full discussion. Electronically Signed   By: Nelson Chimes M.D.   On: 12/15/2019 22:30   CT CEREBRAL PERFUSION W CONTRAST  Result Date: 12/15/2019 CLINICAL DATA:  Acute presentation with right arm weakness. Abnormal head CT. EXAM: CT ANGIOGRAPHY HEAD AND NECK CT PERFUSION BRAIN TECHNIQUE: Multidetector CT imaging of the head and neck was performed using the standard protocol during bolus administration of intravenous contrast. Multiplanar CT image reconstructions and MIPs were obtained to evaluate the vascular anatomy. Carotid stenosis measurements (when applicable) are obtained utilizing NASCET criteria, using the distal internal carotid diameter as the denominator. Multiphase CT imaging of the brain was performed following  IV bolus contrast injection. Subsequent parametric perfusion maps were calculated using RAPID software. CONTRAST:  146mL OMNIPAQUE IOHEXOL 350 MG/ML SOLN COMPARISON:  Head CT earlier same day FINDINGS: CTA NECK FINDINGS Aortic arch: Mild aortic atherosclerosis. Branching pattern is normal without origin stenosis. Right carotid system: Common carotid artery widely patent to the bifurcation. Carotid bifurcation is  normal without soft or calcified plaque. Cervical ICA is normal. Left carotid system: Common carotid artery widely patent to the bifurcation. Carotid bifurcation is normal without soft or calcified plaque. Cervical ICA is normal. Vertebral arteries: Both vertebral artery origins are normal. Both vertebral arteries are normal through the cervical region to the foramen magnum. Skeleton: Mild cervical spondylosis C6-7. Other neck: No mass or lymphadenopathy. Upper chest: Normal Review of the MIP images confirms the above findings CTA HEAD FINDINGS Anterior circulation: Both internal carotid arteries are widely patent through the skull base and siphon regions. No siphon stenosis. The anterior and middle cerebral vessels are normal. 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 Both anterior cerebral is a receive there supply from the left carotid circulation. Fetal origin of the right PCA from the right carotid. The anterior and middle cerebral vessels opacified normally. No visible large or medium vessel occlusion. Posterior circulation: Both vertebral arteries are widely patent through the foramen magnum to the basilar. No basilar stenosis. Both posterior cerebral arteries are patent, with the right receiving it supply from the anterior circulation as noted above. Venous sinuses: Major venous structures are patent. At the left vertex, immediately adjacent to the CT abnormality, there is a vein which appears to show a tram track sign. Question if this could be thrombosis of a superficial cortical vein, resulting in venous infarction. This vein is marked with arrows on series 5. Relevant coronal image marked on series number 6. Anatomic variants: None other significant. Review of the MIP images confirms the above findings CT Brain Perfusion Findings: ASPECTS: 10 CBF (<30%) Volume: 39mL Perfusion (Tmax>6.0s) volume: 67mL Mismatch Volume: 51mL Infarction Location:None There is actually increased CBV and CBF in the area of the imaging  abnormality at the left frontoparietal vertex. This could be seen either with lungs reperfusion from previous arterial infarction, venous infarction with plethora, or tumor. IMPRESSION: No arterial disease demonstrated. At the left frontoparietal vertex, the area which is abnormal on CT shows elevated CBV and CBF. This could be seen with luxury perfusion, cortical venous thrombosis or tumor. Evaluation of the veins is actually best accomplished at the CT angiogram. There is questionably an abnormal vein overlying the area of abnormal brain, possibly with partial thrombosis. I do not think this is conclusive, but the findings are suspicious as detailed in the body of the report. Electronically Signed   By: Nelson Chimes M.D.   On: 12/15/2019 21:49   CT VENOGRAM HEAD  Result Date: 12/15/2019 CLINICAL DATA:  Abnormal left frontoparietal vertex. EXAM: CT VENOGRAM HEAD CONTRAST:  144mL OMNIPAQUE IOHEXOL 350 MG/ML SOLN COMPARISON:  Arterial study and perfusion study same day. FINDINGS: Venous detail is actually better seen on the arterial study. With the benefit of that study, there continues to be a suspicious cortical vein at the left parietal vertex which could be partially thrombosed. The superior sagittal sinus is patent. No other venous pathology is seen. Right transverse sinus is dominant. 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 Tentorial veins do result in flow in the distal transverse and sigmoid sinus on the left. 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 IMPRESSION: Venous detail is actually better seen on the arterial study. With the benefit of that study, there continues to be a suspicious cortical vein at the left parietal vertex which could be partially thrombosed. The superior sagittal sinus is patent. No other venous pathology is seen. Electronically Signed   By: Nelson Chimes M.D.   On: 12/15/2019 21:52   CT HEAD CODE STROKE WO  CONTRAST  Result Date: 12/15/2019 CLINICAL DATA:  Code stroke. Expressive aphasia. Right-sided numbness. EXAM: CT HEAD WITHOUT CONTRAST TECHNIQUE: Contiguous axial images were obtained from the base of the skull through the vertex without intravenous contrast. COMPARISON:  None. FINDINGS: Brain: At the left frontoparietal vertex, there is an area of increased density favored to represent some subarachnoid blood within the sulci. Cannot rule out the possibility infarction with hemorrhage. The remainder the brain is normal by CT. No evidence of old infarction. No mass, hydrocephalus or extra-axial collection. Vascular: No abnormal vascular finding at the base of the brain. Skull: Normal Sinuses/Orbits: Normal Other: None ASPECTS ([place] Stroke Program Early CT Score) - Ganglionic level infarction (caudate, lentiform nuclei, internal capsule, insula, M1-M3 cortex): 7 - Supraganglionic infarction (M4-M6 cortex): 3 Total score (0-10 with 10 being normal): 10 IMPRESSION: 1. Area of increased density at the left frontoparietal vertex adjacent to the midline suspicious for some subarachnoid blood in the sulci of the region. Cannot rule out the possibility of infarction with petechial hemorrhage. Mass lesion is conceivable but least likely. 2. ASPECTS is 10 3. These results were communicated to Dr. Rory Percy at 9:04 pmon 2/10/2021by direct phone conversation. Electronically Signed   By: Nelson Chimes M.D.   On: 12/15/2019 21:09      Subjective: Patient seen this morning in the ED.  She reports that all her symptoms resolved sometime overnight.  Speech back to normal.  No right-sided weakness.  No headache or visual symptoms. Patient not on any prescription medications for any medical conditions.  Non-smoker.  Occasional and social alcohol use.  Discharge Exam:  Vitals:   12/16/19 1000 12/16/19 1100 12/16/19 1205 12/16/19 1632  BP: 120/81 112/77 123/77 125/87  Pulse: 75 77 72 70  Resp: 19 18 18    Temp:   98.6 F  (37 C) 98.7 F (37.1 C)  TempSrc:   Oral Oral  SpO2: 98% 95% 95% 95%  Weight:      Height:       Patient was interviewed and examined along with an ED female nurse tech as chaperone in the room.  General: Pleasant young female, moderately built and nourished lying comfortably propped up in bed without distress.  Oral mucosa moist. Cardiovascular: S1 & S2 heard, RRR, S1/S2 +. No murmurs, rubs, gallops or clicks. No JVD or pedal edema.  Telemetry personally reviewed: Sinus rhythm. Respiratory: Clear to auscultation without wheezing, rhonchi or crackles. No increased work of breathing. Abdominal:  Non distended, non tender & soft. No organomegaly or masses appreciated. Normal bowel sounds heard. CNS: Alert and oriented. No focal deficits. Extremities: no edema, no cyanosis    The results of significant diagnostics from this hospitalization (including imaging, microbiology, ancillary and laboratory) are listed below for reference.     Microbiology: Recent Results (from the past 240 hour(s))  Respiratory Panel by RT PCR (Flu A&B, Covid) - Nasopharyngeal Swab     Status: None   Collection Time: 12/15/19  9:40 PM   Specimen: Nasopharyngeal Swab  Result Value Ref Range Status   SARS Coronavirus 2  by RT PCR NEGATIVE NEGATIVE Final    Comment: (NOTE) SARS-CoV-2 target nucleic acids are NOT DETECTED. The SARS-CoV-2 RNA is generally detectable in upper respiratoy specimens during the acute phase of infection. The lowest concentration of SARS-CoV-2 viral copies this assay can detect is 131 copies/mL. A negative result does not preclude SARS-Cov-2 infection and should not be used as the sole basis for treatment or other patient management decisions. A negative result may occur with  improper specimen collection/handling, submission of specimen other than nasopharyngeal swab, presence of viral mutation(s) within the areas targeted by this assay, and inadequate number of viral copies (<131  copies/mL). A negative result must be combined with clinical observations, patient history, and epidemiological information. The expected result is Negative. Fact Sheet for Patients:  PinkCheek.be Fact Sheet for Healthcare Providers:  GravelBags.it This test is not yet ap proved or cleared by the [place] FDA and  has been authorized for detection and/or diagnosis of SARS-CoV-2 by FDA under an Emergency Use Authorization (EUA). This EUA will remain  in effect (meaning this test can be used) for the duration of the COVID-19 declaration under Section 564(b)(1) of the Act, 21 U.S.C. section 360bbb-3(b)(1), unless the authorization is terminated or revoked sooner.    Influenza A by PCR NEGATIVE NEGATIVE Final   Influenza B by PCR NEGATIVE NEGATIVE Final    Comment: (NOTE) The Xpert Xpress SARS-CoV-2/FLU/RSV assay is intended as an aid in  the diagnosis of influenza from Nasopharyngeal swab specimens and  should not be used as a sole basis for treatment. Nasal washings and  aspirates are unacceptable for Xpert Xpress SARS-CoV-2/FLU/RSV  testing. Fact Sheet for Patients: PinkCheek.be Fact Sheet for Healthcare Providers: GravelBags.it This test is not yet approved or cleared by the [place] FDA and  has been authorized for detection and/or diagnosis of SARS-CoV-2 by  FDA under an Emergency Use Authorization (EUA). This EUA will remain  in effect (meaning this test can be used) for the duration of the  Covid-19 declaration under Section 564(b)(1) of the Act, 21  U.S.C. section 360bbb-3(b)(1), unless the authorization is  terminated or revoked. Performed at [facility], [place] [address] [place], [place] [address]      Labs: CBC: Recent Labs  Lab 12/15/19 2056 12/15/19 2114  WBC 6.7  --   NEUTROABS 3.1  --   HGB 14.7 14.6  HCT 43.3 43.0  MCV 92.1  --    PLT 244  --     Basic Metabolic Panel: Recent Labs  Lab 12/15/19 2056 12/15/19 2114 12/16/19 0817  NA 141 140 142  K 3.1* 2.9* 4.1  CL 104 103 105  CO2 24  --  22  GLUCOSE 113* 107* 151*  BUN 12 14 10   CREATININE 0.89 0.90 0.75  CALCIUM 9.4  --  9.8  MG  --   --  2.1    Liver Function Tests: Recent Labs  Lab 12/15/19 2056  AST 24  ALT 18  ALKPHOS 60  BILITOT 0.5  PROT 7.1  ALBUMIN 4.3     Urinalysis    Component Value Date/Time   COLORURINE STRAW (A) 12/16/2019 0011   APPEARANCEUR CLEAR 12/16/2019 0011   LABSPEC 1.025 12/16/2019 0011   PHURINE 7.0 12/16/2019 0011   GLUCOSEU NEGATIVE 12/16/2019 0011   GLUCOSEU NEGATIVE 09/03/2013 0902   HGBUR NEGATIVE 12/16/2019 0011   BILIRUBINUR NEGATIVE 12/16/2019 0011   BILIRUBINUR negative 04/07/2015 1032   KETONESUR NEGATIVE 12/16/2019 0011   PROTEINUR  NEGATIVE 12/16/2019 0011   UROBILINOGEN 0.2 04/07/2015 1032   UROBILINOGEN 0.2 09/03/2013 0902   NITRITE NEGATIVE 12/16/2019 0011   LEUKOCYTESUR NEGATIVE 12/16/2019 0011    Patient kindly declined my offer to speak with her spouse to update care and answer questions.  Time coordinating discharge: 35 minutes  SIGNED:  Vernell Leep, MD, [place], [facility]. [facility]  To contact the attending provider between 7A-7P or the covering provider during after hours 7P-7A, please log into the web site [email or web address] and access using universal [place] password for that web site. If you do not have the password, please call the hospital operator.

## 2019-12-16 NOTE — Discharge Instructions (Signed)

## 2019-12-16 NOTE — ED Notes (Signed)
The pt up to br  Admitting doctor at  The  bedside

## 2019-12-16 NOTE — Progress Notes (Signed)
Pt discharge education and instructions completed with pt at bedside; pt voices understanding and denies any questions. Pt IV and telemetry removed; pt discharged home with husband to transport her off to disposition. Pt to pick up electronically sent prescription from preferred pharmacy on file. Pt transported off unit via wheelchair with belongings to the side. Delia Heady RN

## 2019-12-16 NOTE — Consult Note (Signed)
[facility] Consult Note  Patient Care Team: [name], DO as PCP - General (Family Medicine)  CHIEF COMPLAINTS/PURPOSE OF CONSULTATION:  Focal Seizure Left Frontal Mass  HISTORY OF PRESENTING ILLNESS:  Hannah [name] 59 y.o. female presented with sudden on set left arm and leg weakness and interrupted speech, last night at around 8pm.  She was playing tennis at the time, [name], stroke eval and CNS imaging which demonstrated a non-enhancing left frontal mass.  At this time she is back to baseline.  No history or seizure or any other neurologic events.  She does describe being sleep deprived (less than 2 hours sleep) yesterday prior to event.  MEDICAL HISTORY:  Past Medical History:  Diagnosis Date  . Left ankle sprain 10/07/2011    SURGICAL HISTORY: Past Surgical History:  Procedure Laterality Date  . FOOT SURGERY Left    bone spur  . TONSILECTOMY/ADENOIDECTOMY WITH MYRINGOTOMY    . WISDOM TOOTH EXTRACTION      SOCIAL HISTORY: Social History   Socioeconomic History  . Marital status: Married    Spouse name: Not on file  . Number of children: 3  . Years of education: Not on file  . Highest education level: Not on file  Occupational History  . Not on file  Tobacco Use  . Smoking status: Former Smoker    Years: 2.00    Types: Cigarettes    Quit date: 11/05/1983    Years since quitting: 36.1  . Smokeless tobacco: Never Used  . Tobacco comment: 1/2 pack a week  Substance and Sexual Activity  . Alcohol use: No    Alcohol/week: 0.0 standard drinks    Comment: 1 glass of wine weekly  . Drug use: No  . Sexual activity: Yes    Birth control/protection: Surgical  Other Topics Concern  . Not on file  Social History Narrative  . Not on file   Social Determinants of Health   Financial Resource Strain:   . Difficulty of Paying Living Expenses: Not on  file  Food Insecurity:   . Worried About Charity fundraiser in the Last Year: Not on file  . Ran Out of Food in the Last Year: Not on file  Transportation Needs:   . Lack of Transportation (Medical): Not on file  . Lack of Transportation (Non-Medical): Not on file  Physical Activity:   . Days of Exercise per Week: Not on file  . Minutes of Exercise per Session: Not on file  Stress:   . Feeling of Stress : Not on file  Social Connections:   . Frequency of Communication with Friends and Family: Not on file  . Frequency of Social Gatherings with Friends and Family: Not on file  . Attends Religious Services: Not on file  . Active Member of Clubs or Organizations: Not on file  . Attends Archivist Meetings: Not on file  . Marital Status: Not on file  Intimate Partner Violence:   . Fear of Current or Ex-Partner: Not on file  . Emotionally Abused: Not on file  . Physically Abused: Not on file  . Sexually Abused: Not on file    FAMILY HISTORY: Family History  Problem Relation Age of Onset  . Hypertension Mother   . Stroke Mother   . Diabetes Mother   . Cancer Father 66       colon  .  Hypertension Sister   . Colon polyps Sister   . Heart disease Brother 82  . Diabetes Maternal Uncle   . Diabetes Maternal Uncle     ALLERGIES:  has No Known Allergies.  MEDICATIONS:  Current Facility-Administered Medications  Medication Dose Route Frequency Provider Last Rate Last Admin  . 0.9 %  sodium chloride infusion  250 mL Intravenous PRN Norins, Heinz Knuckles, MD      . acetaminophen (TYLENOL) tablet 650 mg  650 mg Oral Q6H PRN Norins, Heinz Knuckles, MD       Or  . acetaminophen (TYLENOL) suppository 650 mg  650 mg Rectal Q6H PRN Norins, Heinz Knuckles, MD      . dexamethasone (DECADRON) injection 4 mg  4 mg Intravenous Q6H Amie Portland, MD   4 mg at 12/16/19 1513  . enoxaparin (LOVENOX) injection 40 mg  40 mg Subcutaneous Daily Norins, Heinz Knuckles, MD      . levETIRAcetam (KEPPRA) IVPB  500 mg/100 mL premix  500 mg Intravenous Q12H Amie Portland, MD   Stopped at 12/16/19 1024  . sodium chloride flush (NS) 0.9 % injection 3 mL  3 mL Intravenous Q12H Norins, Heinz Knuckles, MD   3 mL at 12/16/19 0930  . sodium chloride flush (NS) 0.9 % injection 3 mL  3 mL Intravenous PRN Norins, Heinz Knuckles, MD      . traMADol Veatrice Bourbon) tablet 50 mg  50 mg Oral Q6H PRN Norins, Heinz Knuckles, MD        REVIEW OF SYSTEMS:   Constitutional: Denies fevers, chills or abnormal weight loss Eyes: Denies blurriness of vision Ears, nose, mouth, throat, and face: Denies mucositis or sore throat Respiratory: Denies cough, dyspnea or wheezes Cardiovascular: Denies palpitation, chest discomfort or lower extremity swelling Gastrointestinal:  Denies nausea, constipation, diarrhea GU: Denies dysuria or incontinence Skin: Denies abnormal skin rashes Neurological: Per HPI Musculoskeletal: Denies joint pain, back or neck discomfort. No decrease in ROM Behavioral/Psych: Denies anxiety, disturbance in thought content, and mood instability   PHYSICAL EXAMINATION: Vitals:   12/16/19 1205 12/16/19 1632  BP: 123/77 125/87  Pulse: 72 70  Resp: 18   Temp: 98.6 F (37 C) 98.7 F (37.1 C)  SpO2: 95% 95%   KPS: 90. General: Alert, cooperative, pleasant, in no acute distress Head: Normal EENT: No conjunctival injection or scleral icterus. Oral mucosa moist Lungs: Resp effort normal Cardiac: Regular rate and rhythm Abdomen: Soft, non-distended abdomen Skin: No rashes cyanosis or petechiae. Extremities: No clubbing or edema  NEUROLOGIC EXAM: Mental Status: Awake, alert, attentive to examiner. Oriented to self and environment. Language is fluent with intact comprehension.  Cranial Nerves: Visual acuity is grossly normal. Visual fields are full. Extra-ocular movements intact. No ptosis. Face is symmetric, tongue midline. Motor: Tone and bulk are normal. Power is full in both arms and legs.  Sensory: Intact to light  touch and temperature Gait: Deferred  LABORATORY DATA:  I have reviewed the data as listed Lab Results  Component Value Date   WBC 6.7 12/15/2019   HGB 14.6 12/15/2019   HCT 43.0 12/15/2019   MCV 92.1 12/15/2019   PLT 244 12/15/2019   Recent Labs    12/15/19 2056 12/15/19 2114 12/16/19 0817  NA 141 140 142  K 3.1* 2.9* 4.1  CL 104 103 105  CO2 24  --  22  GLUCOSE 113* 107* 151*  BUN 12 14 10   CREATININE 0.89 0.90 0.75  CALCIUM 9.4  --  9.8  GFRNONAA >60  --  >  60  GFRAA >60  --  >60  PROT 7.1  --   --   ALBUMIN 4.3  --   --   AST 24  --   --   ALT 18  --   --   ALKPHOS 60  --   --   BILITOT 0.5  --   --     RADIOGRAPHIC STUDIES: I have personally reviewed the radiological images as listed and agreed with the findings in the report. CT Angio Head W or Wo Contrast  Result Date: 12/15/2019 CLINICAL DATA:  Acute presentation with right arm weakness. Abnormal head CT. EXAM: CT ANGIOGRAPHY HEAD AND NECK CT PERFUSION BRAIN TECHNIQUE: Multidetector CT imaging of the head and neck was performed using the standard protocol during bolus administration of intravenous contrast. Multiplanar CT image reconstructions and MIPs were obtained to evaluate the vascular anatomy. Carotid stenosis measurements (when applicable) are obtained utilizing NASCET criteria, using the distal internal carotid diameter as the denominator. Multiphase CT imaging of the brain was performed following IV bolus contrast injection. Subsequent parametric perfusion maps were calculated using RAPID software. CONTRAST:  174mL OMNIPAQUE IOHEXOL 350 MG/ML SOLN COMPARISON:  Head CT earlier same day FINDINGS: CTA NECK FINDINGS Aortic arch: Mild aortic atherosclerosis. Branching pattern is normal without origin stenosis. Right carotid system: Common carotid artery widely patent to the bifurcation. Carotid bifurcation is normal without soft or calcified plaque. Cervical ICA is normal. Left carotid system: Common carotid artery  widely patent to the bifurcation. Carotid bifurcation is normal without soft or calcified plaque. Cervical ICA is normal. Vertebral arteries: Both vertebral artery origins are normal. Both vertebral arteries are normal through the cervical region to the foramen magnum. Skeleton: Mild cervical spondylosis C6-7. Other neck: No mass or lymphadenopathy. Upper chest: Normal Review of the MIP images confirms the above findings CTA HEAD FINDINGS Anterior circulation: Both internal carotid arteries are widely patent through the skull base and siphon regions. No siphon stenosis. The anterior and middle cerebral vessels are normal. 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 Both anterior cerebral is a receive there supply from the left carotid circulation. Fetal origin of the right PCA from the right carotid. The anterior and middle cerebral vessels opacified normally. No visible large or medium vessel occlusion. Posterior circulation: Both vertebral arteries are widely patent through the foramen magnum to the basilar. No basilar stenosis. Both posterior cerebral arteries are patent, with the right receiving it supply from the anterior circulation as noted above. Venous sinuses: Major venous structures are patent. At the left vertex, immediately adjacent to the CT abnormality, there is a vein which appears to show a tram track sign. Question if this could be thrombosis of a superficial cortical vein, resulting in venous infarction. This vein is marked with arrows on series 5. Relevant coronal image marked on series number 6. Anatomic variants: None other significant. Review of the MIP images confirms the above findings CT Brain Perfusion Findings: ASPECTS: 10 CBF (<30%) Volume: 76mL Perfusion (Tmax>6.0s) volume: 61mL Mismatch Volume: 42mL Infarction Location:None There is actually increased CBV and CBF in the area of the imaging abnormality at the left frontoparietal vertex. This could be seen either with lungs reperfusion from  previous arterial infarction, venous infarction with plethora, or tumor. IMPRESSION: No arterial disease demonstrated. At the left frontoparietal vertex, the area which is abnormal on CT shows elevated CBV and CBF. This could be seen with luxury perfusion, cortical venous thrombosis or tumor. Evaluation of the veins is actually best  accomplished at the CT angiogram. There is questionably an abnormal vein overlying the area of abnormal brain, possibly with partial thrombosis. I do not think this is conclusive, but the findings are suspicious as detailed in the body of the report. Electronically Signed   By: Nelson Chimes M.D.   On: 12/15/2019 21:49   CT Angio Neck W and/or Wo Contrast  Result Date: 12/15/2019 CLINICAL DATA:  Acute presentation with right arm weakness. Abnormal head CT. EXAM: CT ANGIOGRAPHY HEAD AND NECK CT PERFUSION BRAIN TECHNIQUE: Multidetector CT imaging of the head and neck was performed using the standard protocol during bolus administration of intravenous contrast. Multiplanar CT image reconstructions and MIPs were obtained to evaluate the vascular anatomy. Carotid stenosis measurements (when applicable) are obtained utilizing NASCET criteria, using the distal internal carotid diameter as the denominator. Multiphase CT imaging of the brain was performed following IV bolus contrast injection. Subsequent parametric perfusion maps were calculated using RAPID software. CONTRAST:  111mL OMNIPAQUE IOHEXOL 350 MG/ML SOLN COMPARISON:  Head CT earlier same day FINDINGS: CTA NECK FINDINGS Aortic arch: Mild aortic atherosclerosis. Branching pattern is normal without origin stenosis. Right carotid system: Common carotid artery widely patent to the bifurcation. Carotid bifurcation is normal without soft or calcified plaque. Cervical ICA is normal. Left carotid system: Common carotid artery widely patent to the bifurcation. Carotid bifurcation is normal without soft or calcified plaque. Cervical ICA is  normal. Vertebral arteries: Both vertebral artery origins are normal. Both vertebral arteries are normal through the cervical region to the foramen magnum. Skeleton: Mild cervical spondylosis C6-7. Other neck: No mass or lymphadenopathy. Upper chest: Normal Review of the MIP images confirms the above findings CTA HEAD FINDINGS Anterior circulation: Both internal carotid arteries are widely patent through the skull base and siphon regions. No siphon stenosis. The anterior and middle cerebral vessels are normal. 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 Both anterior cerebral is a receive there supply from the left carotid circulation. Fetal origin of the right PCA from the right carotid. The anterior and middle cerebral vessels opacified normally. No visible large or medium vessel occlusion. Posterior circulation: Both vertebral arteries are widely patent through the foramen magnum to the basilar. No basilar stenosis. Both posterior cerebral arteries are patent, with the right receiving it supply from the anterior circulation as noted above. Venous sinuses: Major venous structures are patent. At the left vertex, immediately adjacent to the CT abnormality, there is a vein which appears to show a tram track sign. Question if this could be thrombosis of a superficial cortical vein, resulting in venous infarction. This vein is marked with arrows on series 5. Relevant coronal image marked on series number 6. Anatomic variants: None other significant. Review of the MIP images confirms the above findings CT Brain Perfusion Findings: ASPECTS: 10 CBF (<30%) Volume: 62mL Perfusion (Tmax>6.0s) volume: 31mL Mismatch Volume: 17mL Infarction Location:None There is actually increased CBV and CBF in the area of the imaging abnormality at the left frontoparietal vertex. This could be seen either with lungs reperfusion from previous arterial infarction, venous infarction with plethora, or tumor. IMPRESSION: No arterial disease demonstrated.  At the left frontoparietal vertex, the area which is abnormal on CT shows elevated CBV and CBF. This could be seen with luxury perfusion, cortical venous thrombosis or tumor. Evaluation of the veins is actually best accomplished at the CT angiogram. There is questionably an abnormal vein overlying the area of abnormal brain, possibly with partial thrombosis. I do not think this is  conclusive, but the findings are suspicious as detailed in the body of the report. Electronically Signed   By: Nelson Chimes M.D.   On: 12/15/2019 21:49   MR Brain W and Wo Contrast  Result Date: 12/15/2019 CLINICAL DATA:  Headache. Abnormal CT studies. Subarachnoid hemorrhage suspected. Expressive aphasia and right arm weakness developed while playing tennis. EXAM: MRI HEAD WITHOUT AND WITH CONTRAST TECHNIQUE: Multiplanar, multiecho pulse sequences of the brain and surrounding structures were obtained without and with intravenous contrast. CONTRAST:  66mL GADAVIST GADOBUTROL 1 MMOL/ML IV SOLN COMPARISON:  CT studies earlier same day. FINDINGS: Brain: The study shows a mass lesion of the left posterior frontal lobe, probably involving the brain anterior to the precentral gyrus. Mass lesion measures approximately 3 cm in diameter. At the extreme vertex, there is a portion of the lesion that measures about 2 cm in diameter that behaves differently than other portions of the mass lesion. This includes an indistinct region of contrast enhancement measuring up to 1 cm in size. 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 I do not think we are dealing with either arterial or venous infarction in this case and the MRI scan does not show any evidence of hemorrhage. What was initially interpreted as subarachnoid hemorrhage was in fact density due to the tumor. Elsewhere, there are mild to moderate chronic small-vessel ischemic  changes affecting the cerebral hemispheric white matter. No hydrocephalus or extra-axial collection. Vascular: Major vessels at the base of the brain show flow. No suspicion of venous thrombosis disease, either at the vertex affecting the cortical veins or of the left transverse sinus. Skull and upper cervical spine: Negative Sinuses/Orbits: Clear/normal Other: None IMPRESSION: Approximately 3 cm in size mass lesion at the left posterior frontal vertex affecting the brain anterior to the precentral gyrus. Within the approximately 3 cm mass, there is a 2 cm region that behaves differently, with more mass effect and with a 1 cm region of indistinct enhancement. 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 See above for full discussion. Electronically Signed   By: Nelson Chimes M.D.   On: 12/15/2019 22:30   CT CEREBRAL PERFUSION W CONTRAST  Result Date: 12/15/2019 CLINICAL DATA:  Acute presentation with right arm weakness. Abnormal head CT. EXAM: CT ANGIOGRAPHY HEAD AND NECK CT PERFUSION BRAIN TECHNIQUE: Multidetector CT imaging of the head and neck was performed using the standard protocol during bolus administration of intravenous contrast. Multiplanar CT image reconstructions and MIPs were obtained to evaluate the vascular anatomy. Carotid stenosis measurements (when applicable) are obtained utilizing NASCET criteria, using the distal internal carotid diameter as the denominator. Multiphase CT imaging of the brain was performed following IV bolus contrast injection. Subsequent parametric perfusion maps were calculated using RAPID software. CONTRAST:  146mL OMNIPAQUE IOHEXOL 350 MG/ML SOLN COMPARISON:  Head CT earlier same day FINDINGS: CTA NECK FINDINGS Aortic arch: Mild aortic atherosclerosis. Branching pattern is normal without origin stenosis. Right carotid system: Common carotid artery widely patent to the bifurcation. Carotid bifurcation is normal without soft or  calcified plaque. Cervical ICA is normal. Left carotid system: Common carotid artery widely patent to the bifurcation. Carotid bifurcation is normal without soft or calcified plaque. Cervical ICA is normal. Vertebral arteries: Both vertebral artery origins are normal. Both vertebral arteries are normal through the cervical region to the foramen magnum. Skeleton: Mild cervical spondylosis C6-7. Other neck: No mass  or lymphadenopathy. Upper chest: Normal Review of the MIP images confirms the above findings CTA HEAD FINDINGS Anterior circulation: Both internal carotid arteries are widely patent through the skull base and siphon regions. No siphon stenosis. The anterior and middle cerebral vessels are normal. 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 Both anterior cerebral is a receive there supply from the left carotid circulation. Fetal origin of the right PCA from the right carotid. The anterior and middle cerebral vessels opacified normally. No visible large or medium vessel occlusion. Posterior circulation: Both vertebral arteries are widely patent through the foramen magnum to the basilar. No basilar stenosis. Both posterior cerebral arteries are patent, with the right receiving it supply from the anterior circulation as noted above. Venous sinuses: Major venous structures are patent. At the left vertex, immediately adjacent to the CT abnormality, there is a vein which appears to show a tram track sign. Question if this could be thrombosis of a superficial cortical vein, resulting in venous infarction. This vein is marked with arrows on series 5. Relevant coronal image marked on series number 6. Anatomic variants: None other significant. Review of the MIP images confirms the above findings CT Brain Perfusion Findings: ASPECTS: 10 CBF (<30%) Volume: 92mL Perfusion (Tmax>6.0s) volume: 23mL Mismatch Volume: 15mL Infarction Location:None There is actually increased CBV and CBF in the area of the imaging abnormality at the  left frontoparietal vertex. This could be seen either with lungs reperfusion from previous arterial infarction, venous infarction with plethora, or tumor. IMPRESSION: No arterial disease demonstrated. At the left frontoparietal vertex, the area which is abnormal on CT shows elevated CBV and CBF. This could be seen with luxury perfusion, cortical venous thrombosis or tumor. Evaluation of the veins is actually best accomplished at the CT angiogram. There is questionably an abnormal vein overlying the area of abnormal brain, possibly with partial thrombosis. I do not think this is conclusive, but the findings are suspicious as detailed in the body of the report. Electronically Signed   By: Nelson Chimes M.D.   On: 12/15/2019 21:49   CT VENOGRAM HEAD  Result Date: 12/15/2019 CLINICAL DATA:  Abnormal left frontoparietal vertex. EXAM: CT VENOGRAM HEAD CONTRAST:  155mL OMNIPAQUE IOHEXOL 350 MG/ML SOLN COMPARISON:  Arterial study and perfusion study same day. FINDINGS: Venous detail is actually better seen on the arterial study. With the benefit of that study, there continues to be a suspicious cortical vein at the left parietal vertex which could be partially thrombosed. The superior sagittal sinus is patent. No other venous pathology is seen. Right transverse sinus is dominant. 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 Tentorial veins do result in flow in the distal transverse and sigmoid sinus on the left. 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 IMPRESSION: Venous detail is actually better seen on the arterial study. With the benefit of that study, there continues to be a suspicious cortical vein at the left parietal vertex which could be partially thrombosed. The superior sagittal sinus is patent. No other venous pathology is seen. Electronically Signed   By: Nelson Chimes M.D.   On: 12/15/2019 21:52   CT HEAD CODE STROKE WO CONTRAST  Result Date:  12/15/2019 CLINICAL DATA:  Code stroke. Expressive aphasia. Right-sided numbness. EXAM: CT HEAD WITHOUT CONTRAST TECHNIQUE: Contiguous axial images were obtained from the base of the skull through the vertex without intravenous contrast. COMPARISON:  None. FINDINGS: Brain: At the left frontoparietal vertex, there is an  area of increased density favored to represent some subarachnoid blood within the sulci. Cannot rule out the possibility infarction with hemorrhage. The remainder the brain is normal by CT. No evidence of old infarction. No mass, hydrocephalus or extra-axial collection. Vascular: No abnormal vascular finding at the base of the brain. Skull: Normal Sinuses/Orbits: Normal Other: None ASPECTS ([place] Stroke Program Early CT Score) - Ganglionic level infarction (caudate, lentiform nuclei, internal capsule, insula, M1-M3 cortex): 7 - Supraganglionic infarction (M4-M6 cortex): 3 Total score (0-10 with 10 being normal): 10 IMPRESSION: 1. Area of increased density at the left frontoparietal vertex adjacent to the midline suspicious for some subarachnoid blood in the sulci of the region. Cannot rule out the possibility of infarction with petechial hemorrhage. Mass lesion is conceivable but least likely. 2. ASPECTS is 10 3. These results were communicated to Dr. Rory Percy at 9:04 pmon 2/10/2021by direct phone conversation. Electronically Signed   By: Nelson Chimes M.D.   On: 12/15/2019 21:09    ASSESSMENT & PLAN:  Focal Seizure Left Frontal Mass  Ms. 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  Fortunately she is back at baseline full functional status and has not experienced recurrent events.  Initial seizure may have been provoked by sleep deprived status.  We recommended repeat MRI brain with and w/o contrast in 1 month, mainly to rule out early high grade glioma.  No need for biopsy at this time.  We will discuss her case further this week in brain/spine tumor board.  May discharge with Keppra 500mg  BID, driving restrictions.  No need for corticosteroids as an outpatient.  Our office will arrange follow up scan and visit.  Contact info provided.  All questions were answered. The patient knows to call the clinic with any problems, questions or concerns.  The total time spent in the encounter was 55 minutes and more than 50% was on counseling and review of test results     [name], MD 12/16/2019 5:16 PM

## 2019-12-16 NOTE — ED Notes (Signed)
The pt  Reports that she continues to feel good  She feels,like all her symptoms have vanished

## 2019-12-16 NOTE — ED Notes (Signed)
Ice water given to pt °

## 2019-12-16 NOTE — ED Notes (Signed)
Patient is resting comfortably. 

## 2019-12-16 NOTE — Progress Notes (Signed)
Reason for consult: Aphasia, right side weakness  Subjective: Patient is awake and alert.  No longer having aphasia.  She is able to provide more clear history now that she is no longer aphasic.  She states that her right arm was weak, almost felt like it wanted to jerk.   She also states that she denies having headaches, just has a constant pressure behind her right and nose and was prescribed Flonase by her PCP.     ROS: Unable to obtain due to poor mental status  Examination  Vital signs in last 24 hours: Temp:  [98.4 F (36.9 C)] 98.4 F (36.9 C) (02/10 2125-02-01) Pulse Rate:  [67-102] 73 (02/11 0700) Resp:  [11-30] 16 (02/11 0700) BP: (103-158)/(62-109) 103/65 (02/11 0700) SpO2:  [94 %-99 %] 94 % (02/11 0700) Weight:  [72.6 kg] 72.6 kg (02/10 02-Feb-2156)  General: lying in bed CVS: pulse-normal rate and rhythm RS: breathing comfortably Extremities: normal   Neuro: MS: Alert, oriented, follows commands CN: pupils equal and reactive,  EOMI, face symmetric, tongue midline, normal sensation over face, Motor: 5/5 strength in all 4 extremities Reflexes: 2+ bilaterally over patella, biceps, plantars: flexor Coordination: normal Gait: not tested  Basic Metabolic Panel: Recent Labs  Lab 12/15/19 02-02-55 12/15/19 2114  NA 141 140  K 3.1* 2.9*  CL 104 103  CO2 24  --   GLUCOSE 113* 107*  BUN 12 14  CREATININE 0.89 0.90  CALCIUM 9.4  --     CBC: Recent Labs  Lab 12/15/19 02/02/55 12/15/19 2114  WBC 6.7  --   NEUTROABS 3.1  --   HGB 14.7 14.6  HCT 43.3 43.0  MCV 92.1  --   PLT 244  --      Coagulation Studies: Recent Labs    12/15/19 02-Feb-2055  LABPROT 12.6  INR 1.0    Imaging Reviewed:     ASSESSMENT AND PLAN  59 year old female presented via private vehicle right-sided weakness and some speech difficulty along with history of headaches. 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  CTV negative for sinus  venous thrombosis.  Patient noted to have waxing and waning symptoms, concerning for focal seizures due to cortical irritation versus cerebral edema due to mass lesion.  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 Use caution when using heavy equipment or power tools. Avoid working on ladders or at heights. Take showers instead of baths. Ensure the water temperature is not too high on the home water heater. Do not go swimming alone. Do not lock yourself in a room alone (i.e. bathroom). When caring for infants or small children, sit down when holding, feeding, or changing them to minimize risk of injury to the child in the event you have a seizure. Maintain good sleep hygiene. Avoid alcohol.    If Hannah [name] has another seizure, call 911 and bring them back to the ED if:       A.  The seizure lasts longer than 5 minutes.            B.  The patient doesn't wake shortly after  the seizure or has new problems such as difficulty seeing, speaking or moving following the seizure       C.  The patient was injured during the seizure       D.  The patient has a temperature over 102 F (39C)       E.  The patient vomited during the seizure and now is having trouble breathing     Hannah [name] [facility] Pager Number [phone] For questions after 7pm please refer to AMION to reach the Neurologist on call

## 2019-12-16 NOTE — ED Notes (Signed)
Tele   Breakfast ordered  

## 2019-12-16 NOTE — H&P (Signed)
History and Physical    Hannah [name] [id number] DOB: November 15, 1960 DOA: 12/15/2019  PCP: [name], DO (Confirm with patient/family/NH records and if not entered, this has to be entered at [facility] point of entry) Patient coming from: home  I have personally briefly reviewed patient's old medical records in [place]  Chief Complaint: arm weakness and aphasia  HPI: Hannah [name] is a 59 y.o. female with medical history significant of no significant past medical history, presented via private vehicle which she drove herself for evaluation of right-sided weakness and some overlying difficulty that started suddenly around 7:30 PM today while she was playing tennis. She reports having had some headaches and was diagnosed with recurrent sinusitis last week, and today was playing tennis when she suddenly noted that she could not return return a serve her right arm would not move as well as she would like to.  She also noticed some difficulty with the right leg.  Upon trying to speak, she felt that she had trouble talking.  All the symptoms made her worried, and she drove herself to the hospital.  She reported that her symptoms have been getting somewhat better and then got worse again.   She was seen at triage and a code stroke was activated for possible LVO stroke due to right-sided weakness and some evidence of aphasia on exam. (For level 3, the HPI must include 4+ descriptors: Location, Quality, Severity, Duration, Timing, Context, modifying factors, associated signs/symptoms and/or status of 3+ chronic problems.)  (Please avoid self-populating past medical history here) (The initial 2-3 lines should be focused and good to copy and paste in the HPI section of the daily progress note).  ED Course: Hemodynamically stable. Lab - unrevealing.CT, CTA head /neck with question of SAH. 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 Dr. Malen Gauze consulted: opined that symptoms may have been  seizure related to tumor. He ordered Keppra and decadron. TRH asked to admit to telemetry bed for further work-up  Review of Systems: As per HPI otherwise 10 point review of systems negative.  Unacceptable ROS statements: "10 systems reviewed," "Extensive" (without elaboration).  Acceptable ROS statements: "All others negative," "All others reviewed and are negative," and "All others unremarkable," with at [place] documented Can't double dip - if using for HPI can't use for ROS  Past Medical History:  Diagnosis Date  . Left ankle sprain 10/07/2011    Past Surgical History:  Procedure Laterality Date  . FOOT SURGERY Left    bone spur  . TONSILECTOMY/ADENOIDECTOMY WITH MYRINGOTOMY    . WISDOM TOOTH EXTRACTION     Soc Hx - married. 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    reports that she quit smoking about 36 years ago. Her smoking use included cigarettes. She quit after 2.00 years of use. She has never used smokeless tobacco. She reports that she does not drink alcohol or use drugs.  No Known Allergies  Family History  Problem Relation Age of Onset  . Hypertension Mother   . Stroke Mother   . Diabetes Mother   . Cancer Father 70       colon  . Hypertension Sister   . Colon polyps Sister   . Heart disease Brother 76  . Diabetes Maternal Uncle   . Diabetes Maternal Uncle    Unacceptable: Noncontributory, unremarkable, or negative. Acceptable: Family history reviewed and not pertinent (If you reviewed it)  Prior to Admission medications   Medication Sig Start Date  End Date Taking? Authorizing Provider  amoxicillin-clavulanate (AUGMENTIN) 875-125 MG tablet Take 1 tablet by mouth 2 (two) times daily. 12/08/19  Yes Kuneff, Renee A, DO  fluticasone (FLONASE) 50 MCG/ACT nasal spray Place 2 sprays into both nostrils daily. 12/08/19  Yes Kuneff, Renee A, DO    Physical Exam: Vitals:   12/15/19 2245 12/15/19 2300 12/15/19 2315 12/16/19 0030  BP: (!) 144/80 (!) 127/109  128/78 139/81  Pulse:    91  Resp: (!) 30 (!) 23 17 16   Temp:      TempSrc:      SpO2:    96%  Weight:      Height:        Constitutional: NAD, calm, comfortable Vitals:   12/15/19 2245 12/15/19 2300 12/15/19 2315 12/16/19 0030  BP: (!) 144/80 (!) 127/109 128/78 139/81  Pulse:    91  Resp: (!) 30 (!) 23 17 16   Temp:      TempSrc:      SpO2:    96%  Weight:      Height:       General - WNWD woman in distress. Eyes: PERRL, lids and conjunctivae normal ENMT: Mucous membranes are moist. Posterior pharynx clear of any exudate or lesions.Normal dentition.  Neck: normal, supple, no masses, no thyromegaly Respiratory: clear to auscultation bilaterally, no wheezing, no crackles. Normal respiratory effort. No accessory muscle use.  Cardiovascular: Regular rate and rhythm, no murmurs / rubs / gallops. No extremity edema. 2+ pedal pulses. No carotid bruits.  Abdomen: no tenderness, no masses palpated. No hepatosplenomegaly. Bowel sounds positive.  Musculoskeletal: no clubbing / cyanosis. No joint deformity upper and lower extremities. Good ROM, no contractures. Normal muscle tone.  Skin: no rashes, lesions, ulcers. No induration Neurologic: CN 2-12 grossly intact. Sensation intact, DTR normal. Strength 5/5 in all 4.  Psychiatric: Normal judgment and insight. Alert and oriented x 3. Normal mood.   (Anything < 9 systems with 2 bullets each down codes to level 1) (If patient refuses exam can't bill higher level) (Make sure to document decubitus ulcers present on admission -- if possible -- and whether patient has chronic indwelling catheter at time of admission)  Labs on Admission: I have personally reviewed following labs and imaging studies  CBC: Recent Labs  Lab 12/15/19 2056 12/15/19 2114  WBC 6.7  --   NEUTROABS 3.1  --   HGB 14.7 14.6  HCT 43.3 43.0  MCV 92.1  --   PLT 244  --    Basic Metabolic Panel: Recent Labs  Lab 12/15/19 2056 12/15/19 2114  NA 141 140  K 3.1*  2.9*  CL 104 103  CO2 24  --   GLUCOSE 113* 107*  BUN 12 14  CREATININE 0.89 0.90  CALCIUM 9.4  --    GFR: Estimated Creatinine Clearance: 68 mL/min (by C-G formula based on SCr of 0.9 mg/dL). Liver Function Tests: Recent Labs  Lab 12/15/19 2056  AST 24  ALT 18  ALKPHOS 60  BILITOT 0.5  PROT 7.1  ALBUMIN 4.3   No results for input(s): LIPASE, AMYLASE in the last 168 hours. No results for input(s): AMMONIA in the last 168 hours. Coagulation Profile: Recent Labs  Lab 12/15/19 2056  INR 1.0   Cardiac Enzymes: No results for input(s): CKTOTAL, CKMB, CKMBINDEX, TROPONINI in the last 168 hours. BNP (last 3 results) No results for input(s): PROBNP in the last 8760 hours. HbA1C: No results for input(s): HGBA1C in the last 72 hours. CBG:  No results for input(s): GLUCAP in the last 168 hours. Lipid Profile: No results for input(s): CHOL, HDL, LDLCALC, TRIG, CHOLHDL, LDLDIRECT in the last 72 hours. Thyroid Function Tests: No results for input(s): TSH, T4TOTAL, FREET4, T3FREE, THYROIDAB in the last 72 hours. Anemia Panel: No results for input(s): VITAMINB12, FOLATE, FERRITIN, TIBC, IRON, RETICCTPCT in the last 72 hours. Urine analysis:    Component Value Date/Time   COLORURINE STRAW (A) 12/16/2019 0011   APPEARANCEUR CLEAR 12/16/2019 0011   LABSPEC 1.025 12/16/2019 0011   PHURINE 7.0 12/16/2019 0011   GLUCOSEU NEGATIVE 12/16/2019 0011   GLUCOSEU NEGATIVE 09/03/2013 0902   HGBUR NEGATIVE 12/16/2019 0011   BILIRUBINUR NEGATIVE 12/16/2019 0011   BILIRUBINUR negative 04/07/2015 1032   KETONESUR NEGATIVE 12/16/2019 0011   PROTEINUR NEGATIVE 12/16/2019 0011   UROBILINOGEN 0.2 04/07/2015 1032   UROBILINOGEN 0.2 09/03/2013 0902   NITRITE NEGATIVE 12/16/2019 0011   LEUKOCYTESUR NEGATIVE 12/16/2019 0011    Radiological Exams on Admission: CT Angio Head W or Wo Contrast  Result Date: 12/15/2019 CLINICAL DATA:  Acute presentation with right arm weakness. Abnormal head CT.  EXAM: CT ANGIOGRAPHY HEAD AND NECK CT PERFUSION BRAIN TECHNIQUE: Multidetector CT imaging of the head and neck was performed using the standard protocol during bolus administration of intravenous contrast. Multiplanar CT image reconstructions and MIPs were obtained to evaluate the vascular anatomy. Carotid stenosis measurements (when applicable) are obtained utilizing NASCET criteria, using the distal internal carotid diameter as the denominator. Multiphase CT imaging of the brain was performed following IV bolus contrast injection. Subsequent parametric perfusion maps were calculated using RAPID software. CONTRAST:  163mL OMNIPAQUE IOHEXOL 350 MG/ML SOLN COMPARISON:  Head CT earlier same day FINDINGS: CTA NECK FINDINGS Aortic arch: Mild aortic atherosclerosis. Branching pattern is normal without origin stenosis. Right carotid system: Common carotid artery widely patent to the bifurcation. Carotid bifurcation is normal without soft or calcified plaque. Cervical ICA is normal. Left carotid system: Common carotid artery widely patent to the bifurcation. Carotid bifurcation is normal without soft or calcified plaque. Cervical ICA is normal. Vertebral arteries: Both vertebral artery origins are normal. Both vertebral arteries are normal through the cervical region to the foramen magnum. Skeleton: Mild cervical spondylosis C6-7. Other neck: No mass or lymphadenopathy. Upper chest: Normal Review of the MIP images confirms the above findings CTA HEAD FINDINGS Anterior circulation: Both internal carotid arteries are widely patent through the skull base and siphon regions. No siphon stenosis. The anterior and middle cerebral vessels are normal. 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 Both anterior cerebral is a receive there supply from the left carotid circulation. Fetal origin of the right PCA from the right carotid. The anterior and middle cerebral vessels opacified normally. No visible large or medium vessel occlusion.  Posterior circulation: Both vertebral arteries are widely patent through the foramen magnum to the basilar. No basilar stenosis. Both posterior cerebral arteries are patent, with the right receiving it supply from the anterior circulation as noted above. Venous sinuses: Major venous structures are patent. At the left vertex, immediately adjacent to the CT abnormality, there is a vein which appears to show a tram track sign. Question if this could be thrombosis of a superficial cortical vein, resulting in venous infarction. This vein is marked with arrows on series 5. Relevant coronal image marked on series number 6. Anatomic variants: None other significant. Review of the MIP images confirms the above findings CT Brain Perfusion Findings: ASPECTS: 10 CBF (<30%) Volume: 55mL Perfusion (Tmax>6.0s) volume: 54mL  Mismatch Volume: 70mL Infarction Location:None There is actually increased CBV and CBF in the area of the imaging abnormality at the left frontoparietal vertex. This could be seen either with lungs reperfusion from previous arterial infarction, venous infarction with plethora, or tumor. IMPRESSION: No arterial disease demonstrated. At the left frontoparietal vertex, the area which is abnormal on CT shows elevated CBV and CBF. This could be seen with luxury perfusion, cortical venous thrombosis or tumor. Evaluation of the veins is actually best accomplished at the CT angiogram. There is questionably an abnormal vein overlying the area of abnormal brain, possibly with partial thrombosis. I do not think this is conclusive, but the findings are suspicious as detailed in the body of the report. Electronically Signed   By: Nelson Chimes M.D.   On: 12/15/2019 21:49   CT Angio Neck W and/or Wo Contrast  Result Date: 12/15/2019 CLINICAL DATA:  Acute presentation with right arm weakness. Abnormal head CT. EXAM: CT ANGIOGRAPHY HEAD AND NECK CT PERFUSION BRAIN TECHNIQUE: Multidetector CT imaging of the head and neck was  performed using the standard protocol during bolus administration of intravenous contrast. Multiplanar CT image reconstructions and MIPs were obtained to evaluate the vascular anatomy. Carotid stenosis measurements (when applicable) are obtained utilizing NASCET criteria, using the distal internal carotid diameter as the denominator. Multiphase CT imaging of the brain was performed following IV bolus contrast injection. Subsequent parametric perfusion maps were calculated using RAPID software. CONTRAST:  161mL OMNIPAQUE IOHEXOL 350 MG/ML SOLN COMPARISON:  Head CT earlier same day FINDINGS: CTA NECK FINDINGS Aortic arch: Mild aortic atherosclerosis. Branching pattern is normal without origin stenosis. Right carotid system: Common carotid artery widely patent to the bifurcation. Carotid bifurcation is normal without soft or calcified plaque. Cervical ICA is normal. Left carotid system: Common carotid artery widely patent to the bifurcation. Carotid bifurcation is normal without soft or calcified plaque. Cervical ICA is normal. Vertebral arteries: Both vertebral artery origins are normal. Both vertebral arteries are normal through the cervical region to the foramen magnum. Skeleton: Mild cervical spondylosis C6-7. Other neck: No mass or lymphadenopathy. Upper chest: Normal Review of the MIP images confirms the above findings CTA HEAD FINDINGS Anterior circulation: Both internal carotid arteries are widely patent through the skull base and siphon regions. No siphon stenosis. The anterior and middle cerebral vessels are normal. 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 Both anterior cerebral is a receive there supply from the left carotid circulation. Fetal origin of the right PCA from the right carotid. The anterior and middle cerebral vessels opacified normally. No visible large or medium vessel occlusion. Posterior circulation: Both vertebral arteries are widely patent through the foramen magnum to the basilar. No basilar  stenosis. Both posterior cerebral arteries are patent, with the right receiving it supply from the anterior circulation as noted above. Venous sinuses: Major venous structures are patent. At the left vertex, immediately adjacent to the CT abnormality, there is a vein which appears to show a tram track sign. Question if this could be thrombosis of a superficial cortical vein, resulting in venous infarction. This vein is marked with arrows on series 5. Relevant coronal image marked on series number 6. Anatomic variants: None other significant. Review of the MIP images confirms the above findings CT Brain Perfusion Findings: ASPECTS: 10 CBF (<30%) Volume: 73mL Perfusion (Tmax>6.0s) volume: 68mL Mismatch Volume: 38mL Infarction Location:None There is actually increased CBV and CBF in the area of the imaging abnormality at the left frontoparietal vertex. This could be  seen either with lungs reperfusion from previous arterial infarction, venous infarction with plethora, or tumor. IMPRESSION: No arterial disease demonstrated. At the left frontoparietal vertex, the area which is abnormal on CT shows elevated CBV and CBF. This could be seen with luxury perfusion, cortical venous thrombosis or tumor. Evaluation of the veins is actually best accomplished at the CT angiogram. There is questionably an abnormal vein overlying the area of abnormal brain, possibly with partial thrombosis. I do not think this is conclusive, but the findings are suspicious as detailed in the body of the report. Electronically Signed   By: Nelson Chimes M.D.   On: 12/15/2019 21:49   MR Brain W and Wo Contrast  Result Date: 12/15/2019 CLINICAL DATA:  Headache. Abnormal CT studies. Subarachnoid hemorrhage suspected. Expressive aphasia and right arm weakness developed while playing tennis. EXAM: MRI HEAD WITHOUT AND WITH CONTRAST TECHNIQUE: Multiplanar, multiecho pulse sequences of the brain and surrounding structures were obtained without and with  intravenous contrast. CONTRAST:  83mL GADAVIST GADOBUTROL 1 MMOL/ML IV SOLN COMPARISON:  CT studies earlier same day. FINDINGS: Brain: The study shows a mass lesion of the left posterior frontal lobe, probably involving the brain anterior to the precentral gyrus. Mass lesion measures approximately 3 cm in diameter. At the extreme vertex, there is a portion of the lesion that measures about 2 cm in diameter that behaves differently than other portions of the mass lesion. This includes an indistinct region of contrast enhancement measuring up to 1 cm in size. 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 I do not think we are dealing with either arterial or venous infarction in this case and the MRI scan does not show any evidence of hemorrhage. What was initially interpreted as subarachnoid hemorrhage was in fact density due to the tumor. Elsewhere, there are mild to moderate chronic small-vessel ischemic changes affecting the cerebral hemispheric white matter. No hydrocephalus or extra-axial collection. Vascular: Major vessels at the base of the brain show flow. No suspicion of venous thrombosis disease, either at the vertex affecting the cortical veins or of the left transverse sinus. Skull and upper cervical spine: Negative Sinuses/Orbits: Clear/normal Other: None IMPRESSION: Approximately 3 cm in size mass lesion at the left posterior frontal vertex affecting the brain anterior to the precentral gyrus. Within the approximately 3 cm mass, there is a 2 cm region that behaves differently, with more mass effect and with a 1 cm region of indistinct enhancement. 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 See above for full discussion. Electronically Signed   By: Nelson Chimes M.D.   On: 12/15/2019 22:30   CT CEREBRAL PERFUSION W CONTRAST  Result Date:  12/15/2019 CLINICAL DATA:  Acute presentation with right arm weakness. Abnormal head CT. EXAM: CT ANGIOGRAPHY HEAD AND NECK CT PERFUSION BRAIN TECHNIQUE: Multidetector CT imaging of the head and neck was performed using the standard protocol during bolus administration of intravenous contrast. Multiplanar CT image reconstructions and MIPs were obtained to evaluate the vascular anatomy. Carotid stenosis measurements (when applicable) are obtained utilizing NASCET criteria, using the distal internal carotid diameter as the denominator. Multiphase CT imaging of the brain was performed following IV bolus contrast injection. Subsequent parametric perfusion maps were calculated using RAPID software. CONTRAST:  188mL OMNIPAQUE IOHEXOL 350 MG/ML SOLN COMPARISON:  Head CT earlier same day FINDINGS: CTA NECK FINDINGS Aortic arch: Mild aortic atherosclerosis. Branching pattern  is normal without origin stenosis. Right carotid system: Common carotid artery widely patent to the bifurcation. Carotid bifurcation is normal without soft or calcified plaque. Cervical ICA is normal. Left carotid system: Common carotid artery widely patent to the bifurcation. Carotid bifurcation is normal without soft or calcified plaque. Cervical ICA is normal. Vertebral arteries: Both vertebral artery origins are normal. Both vertebral arteries are normal through the cervical region to the foramen magnum. Skeleton: Mild cervical spondylosis C6-7. Other neck: No mass or lymphadenopathy. Upper chest: Normal Review of the MIP images confirms the above findings CTA HEAD FINDINGS Anterior circulation: Both internal carotid arteries are widely patent through the skull base and siphon regions. No siphon stenosis. The anterior and middle cerebral vessels are normal. 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 Both anterior cerebral is a receive there supply from the left carotid circulation. Fetal origin of the right PCA from the right carotid. The anterior and  middle cerebral vessels opacified normally. No visible large or medium vessel occlusion. Posterior circulation: Both vertebral arteries are widely patent through the foramen magnum to the basilar. No basilar stenosis. Both posterior cerebral arteries are patent, with the right receiving it supply from the anterior circulation as noted above. Venous sinuses: Major venous structures are patent. At the left vertex, immediately adjacent to the CT abnormality, there is a vein which appears to show a tram track sign. Question if this could be thrombosis of a superficial cortical vein, resulting in venous infarction. This vein is marked with arrows on series 5. Relevant coronal image marked on series number 6. Anatomic variants: None other significant. Review of the MIP images confirms the above findings CT Brain Perfusion Findings: ASPECTS: 10 CBF (<30%) Volume: 85mL Perfusion (Tmax>6.0s) volume: 82mL Mismatch Volume: 74mL Infarction Location:None There is actually increased CBV and CBF in the area of the imaging abnormality at the left frontoparietal vertex. This could be seen either with lungs reperfusion from previous arterial infarction, venous infarction with plethora, or tumor. IMPRESSION: No arterial disease demonstrated. At the left frontoparietal vertex, the area which is abnormal on CT shows elevated CBV and CBF. This could be seen with luxury perfusion, cortical venous thrombosis or tumor. Evaluation of the veins is actually best accomplished at the CT angiogram. There is questionably an abnormal vein overlying the area of abnormal brain, possibly with partial thrombosis. I do not think this is conclusive, but the findings are suspicious as detailed in the body of the report. Electronically Signed   By: Nelson Chimes M.D.   On: 12/15/2019 21:49   CT VENOGRAM HEAD  Result Date: 12/15/2019 CLINICAL DATA:  Abnormal left frontoparietal vertex. EXAM: CT VENOGRAM HEAD CONTRAST:  124mL OMNIPAQUE IOHEXOL 350 MG/ML SOLN  COMPARISON:  Arterial study and perfusion study same day. FINDINGS: Venous detail is actually better seen on the arterial study. With the benefit of that study, there continues to be a suspicious cortical vein at the left parietal vertex which could be partially thrombosed. The superior sagittal sinus is patent. No other venous pathology is seen. Right transverse sinus is dominant. 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 Tentorial veins do result in flow in the distal transverse and sigmoid sinus on the left. 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 IMPRESSION: Venous detail is actually better seen on the arterial study. With the benefit of that study, there continues to be a suspicious cortical vein at the left parietal vertex which could be partially thrombosed.  The superior sagittal sinus is patent. No other venous pathology is seen. Electronically Signed   By: Nelson Chimes M.D.   On: 12/15/2019 21:52   CT HEAD CODE STROKE WO CONTRAST  Result Date: 12/15/2019 CLINICAL DATA:  Code stroke. Expressive aphasia. Right-sided numbness. EXAM: CT HEAD WITHOUT CONTRAST TECHNIQUE: Contiguous axial images were obtained from the base of the skull through the vertex without intravenous contrast. COMPARISON:  None. FINDINGS: Brain: At the left frontoparietal vertex, there is an area of increased density favored to represent some subarachnoid blood within the sulci. Cannot rule out the possibility infarction with hemorrhage. The remainder the brain is normal by CT. No evidence of old infarction. No mass, hydrocephalus or extra-axial collection. Vascular: No abnormal vascular finding at the base of the brain. Skull: Normal Sinuses/Orbits: Normal Other: None ASPECTS ([place] Stroke Program Early CT Score) - Ganglionic level infarction (caudate, lentiform nuclei, internal capsule, insula, M1-M3 cortex): 7 - Supraganglionic infarction (M4-M6 cortex): 3 Total score (0-10  with 10 being normal): 10 IMPRESSION: 1. Area of increased density at the left frontoparietal vertex adjacent to the midline suspicious for some subarachnoid blood in the sulci of the region. Cannot rule out the possibility of infarction with petechial hemorrhage. Mass lesion is conceivable but least likely. 2. ASPECTS is 10 3. These results were communicated to Dr. Rory Percy at 9:04 pmon 2/10/2021by direct phone conversation. Electronically Signed   By: Nelson Chimes M.D.   On: 12/15/2019 21:09    EKG: Independently reviewed. NSR  Assessment/Plan Active Problems:   Frontal mass of brain  (please populate well all problems here in Problem List. (For example, if patient is on BP meds at home and you resume or decide to hold them, it is a problem that needs to be her. Same for CAD, COPD, HLD and so on)   1. 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 Her symptoms may have been related to tumor associated seizure. Dr. Malen Gauze saw her in consultation and ordered keppra and decadron Plan Tele admit   Continue Keppra and decadron  Neuro - oncology to see in AM - Dr. Mickeal Skinner and will recommend NS for biopsy  DVT prophylaxis: lovenox (Lovenox/Heparin/SCD's/anticoagulated/None (if comfort care) Code Status: full code (Full/Partial (specify details) Family Communication: husband was present during neuro consult and heard full dx and tx plan (Specify name, relationship. Do not write "discussed with patient". Specify tel # if discussed over the phone) Disposition Plan: home when stable (specify when and where you expect patient to be discharged) Consults called: [facility] - Dr. Malen Gauze (with names) Admission status: inpatient (inpatient / obs / tele / medical floor / SDU)   Adella Hare MD [facility] Pager [phone]  If 7PM-7AM, please contact night-coverage [email or web address] Password [facility]  12/16/2019, 12:48 AM

## 2019-12-17 ENCOUNTER — Telehealth: Payer: Self-pay

## 2019-12-17 ENCOUNTER — Telehealth: Payer: Self-pay | Admitting: *Deleted

## 2019-12-17 ENCOUNTER — Encounter: Payer: Self-pay | Admitting: *Deleted

## 2019-12-17 ENCOUNTER — Other Ambulatory Visit: Payer: Self-pay | Admitting: Radiation Therapy

## 2019-12-17 NOTE — Telephone Encounter (Signed)
LM requesting call back to complete TCM and schedule hospital follow up.   

## 2019-12-17 NOTE — Telephone Encounter (Signed)
Transition Care Management Follow-up Telephone Call   Admission: 12/15/2019-12/16/2019   How have you been since you were released from the hospital? "I'm okay" Patient tearful, no issues with speech. Conversation appropriate.    Do you understand why you were in the hospital? yes, seizure and brain mass   Do you understand the discharge instructions? yes   Where were you discharged to? Home. Resides with family.    Items Reviewed:  Medications reviewed: yes  Allergies reviewed: yes  Dietary changes reviewed: yes  Referrals reviewed: yes, to f/u with Neuro   Functional Questionnaire:   Activities of Daily Living (ADLs):   She states they are independent in the following: ambulation, bathing and hygiene, feeding, continence, grooming, toileting and dressing States they require assistance with the following: None   Any transportation issues/concerns?: yes, pt advised to not drive until cleared outpatient.    Any patient concerns? yes, pt has contacted Neuro for "work release" note in order to return to work.    Confirmed importance and date/time of follow-up visits scheduled yes  Provider Appointment booked with PCP Tuesday, 12/21/2019 virtually.   Confirmed with patient if condition begins to worsen call PCP or go to the ER.  Patient was given the office number and encouraged to call back with question or concerns.  : yes

## 2019-12-17 NOTE — Telephone Encounter (Signed)
Received vm message from patient requesting a return to work letter. She states was discharged from the hospital today and wants to return to work.  She has been advised not to drive d/t new onset seizure activity. New frontal lobe mass.  Please advise

## 2019-12-20 ENCOUNTER — Telehealth: Payer: Self-pay | Admitting: *Deleted

## 2019-12-20 ENCOUNTER — Other Ambulatory Visit: Payer: Self-pay | Admitting: *Deleted

## 2019-12-20 ENCOUNTER — Inpatient Hospital Stay: Payer: No Typology Code available for payment source | Attending: Internal Medicine

## 2019-12-20 DIAGNOSIS — G9389 Other specified disorders of brain: Secondary | ICD-10-CM

## 2019-12-20 NOTE — Telephone Encounter (Signed)
Spoke with patient about need for MRI follow up and visit with Dr. Mickeal Skinner.  MRI ordered and patient to schedule.  Follow up scheduled.  She expressed some concern for side effects from the [place].  Patient does not routinely take medications on a daily basis and the medication is impacting her with some changes.  She reports lethargy, mild dizziness with medication.  She questioned if the dose could be lowered.  I advised I would question but did advise that the dose is being used to manage her seizures and no changes can be made until Dr. Mickeal Skinner instructs. She states understanding.  I advised it might be too soon to decrease but would ask on her behalf.  Routed to MD to advise.

## 2019-12-21 ENCOUNTER — Encounter: Payer: Self-pay | Admitting: Family Medicine

## 2019-12-21 ENCOUNTER — Ambulatory Visit (INDEPENDENT_AMBULATORY_CARE_PROVIDER_SITE_OTHER): Payer: No Typology Code available for payment source | Admitting: Family Medicine

## 2019-12-21 VITALS — BP 124/86 | HR 55 | Temp 97.5°F | Resp 16 | Ht 65.0 in | Wt 157.1 lb

## 2019-12-21 DIAGNOSIS — J0100 Acute maxillary sinusitis, unspecified: Secondary | ICD-10-CM

## 2019-12-21 DIAGNOSIS — G40909 Epilepsy, unspecified, not intractable, without status epilepticus: Secondary | ICD-10-CM | POA: Diagnosis not present

## 2019-12-21 DIAGNOSIS — Z9289 Personal history of other medical treatment: Secondary | ICD-10-CM | POA: Insufficient documentation

## 2019-12-21 DIAGNOSIS — D496 Neoplasm of unspecified behavior of brain: Secondary | ICD-10-CM | POA: Insufficient documentation

## 2019-12-21 DIAGNOSIS — E876 Hypokalemia: Secondary | ICD-10-CM

## 2019-12-21 DIAGNOSIS — G9389 Other specified disorders of brain: Secondary | ICD-10-CM

## 2019-12-21 DIAGNOSIS — Z79899 Other long term (current) drug therapy: Secondary | ICD-10-CM | POA: Diagnosis not present

## 2019-12-21 LAB — COMPREHENSIVE METABOLIC PANEL
ALT: 14 U/L (ref 0–35)
AST: 16 U/L (ref 0–37)
Albumin: 4.5 g/dL (ref 3.5–5.2)
Alkaline Phosphatase: 55 U/L (ref 39–117)
BUN: 15 mg/dL (ref 6–23)
CO2: 29 mEq/L (ref 19–32)
Calcium: 9.4 mg/dL (ref 8.4–10.5)
Chloride: 102 mEq/L (ref 96–112)
Creatinine, Ser: 0.81 mg/dL (ref 0.40–1.20)
GFR: 72.39 mL/min (ref >60.00)
Glucose, Bld: 82 mg/dL (ref 70–99)
Potassium: 4.1 mEq/L (ref 3.5–5.1)
Sodium: 138 mEq/L (ref 135–145)
Total Bilirubin: 0.9 mg/dL (ref 0.2–1.2)
Total Protein: 6.9 g/dL (ref 6.0–8.3)

## 2019-12-21 LAB — CBC WITH DIFFERENTIAL/PLATELET
Basophils Absolute: 0 10*3/uL (ref 0.0–0.1)
Basophils Relative: 0.2 % (ref 0.0–3.0)
Eosinophils Absolute: 0 10*3/uL (ref 0.0–0.7)
Eosinophils Relative: 1 % (ref 0.0–5.0)
HCT: 42.9 % (ref 36.0–46.0)
Hemoglobin: 14.5 g/dL (ref 12.0–15.0)
Lymphocytes Relative: 32.2 % (ref 12.0–46.0)
Lymphs Abs: 1.5 10*3/uL (ref 0.7–4.0)
MCHC: 33.8 g/dL (ref 30.0–36.0)
MCV: 93.1 fl (ref 78.0–100.0)
Monocytes Absolute: 0.4 10*3/uL (ref 0.1–1.0)
Monocytes Relative: 9.6 % (ref 3.0–12.0)
Neutro Abs: 2.7 10*3/uL (ref 1.4–7.7)
Neutrophils Relative %: 57 % (ref 43.0–77.0)
Platelets: 223 10*3/uL (ref 150.0–400.0)
RBC: 4.61 Mil/uL (ref 3.87–5.11)
RDW: 12.5 % (ref 11.5–15.5)
WBC: 4.7 10*3/uL (ref 4.0–10.5)

## 2019-12-21 MED ORDER — AMOXICILLIN-POT CLAVULANATE 875-125 MG PO TABS: 1.0000 | ORAL_TABLET | Freq: Two times a day (BID) | ORAL | 0 refills | Status: DC

## 2019-12-21 MED FILL — AMOX-CLAV 875-125 MG TABLET: 875-125 | 3 days supply | Qty: 6 | Fill #0

## 2019-12-21 NOTE — Patient Instructions (Signed)
Nice to see you today. We will let you know results of labs as soon as we receive them.

## 2019-12-21 NOTE — Progress Notes (Signed)
Hannah [name] , 05-Mar-1961, 59 y.o., female MRN: [id number] Patient Care Team    Relationship Specialty Notifications Start End  [name], DO PCP - General Family Medicine  04/29/16     Chief Complaint  Patient presents with  . Hospitalization Follow-up     Subjective:  [name]  is a 59 y.o. female presents for hospital follow up after recent admission on 10/13/2020 for primary diagnosis seizure activity secondary to new found brain tumor on MRI.  Patient was discharged on 10/14/2020 to home. Patients discharge summary has been reviewed, as well as all labs/image studies obtained during hospitalization.   Patients hospital course: Patient presented to  emergency room after speech difficulty and right-sided weakness of the upper extremity while playing tennis 12/15/2019.  Extensive labs and imaging studies were completed and patient was found to have a 3 cm left frontal lobe brain mass.  Patient was started on Decadron and Keppra while in the hospital.  IV hydration and potassium were supplemented.  She was discharged on Keppra alone. Since hospital discharge patient reports she is doing okay.  She is having side effects to the Keppra and feels tired.  She has follow-up arranged with her neurooncologist for March 16, and they have ordered her outpatient studies.  She still has complaints of right maxillary sinus pressure.  She had been started on a sinusitis treatment with Augmentin 2 days prior to her seizure onset.  She discontinued her antibiotics at that time.  Her CT/MRI results do note sinus and orbits are normal/clear.  Patient reports she has had no additional seizure activity since her hospital discharge.  She has returned to her normal activities which include work and exercise.  She is not driving at this time and understands she will need neurological clearance before returning to driving.  CT Angio Head/neck W or Wo Contrast Result Date: 12/15/2019 Skeleton:  Mild cervical spondylosis C6-7.  IMPRESSION: No arterial disease demonstrated. At the left frontoparietal vertex, the area which is abnormal on CT shows elevated CBV and CBF. This could be seen with luxury perfusion, cortical venous thrombosis or tumor.   MR Brain W and Wo Contrast Result Date: 12/15/2019 FINDINGS: Brain: The study shows a mass lesion of the left posterior frontal lobe, probably involving the brain anterior to the precentral gyrus. Mass lesion measures approximately 3 cm in diameter. At the extreme vertex, there is a portion of the lesion that measures about 2 cm in diameter that behaves differently than other portions of the mass lesion. This includes an indistinct region of contrast enhancement measuring up to 1 cm in size. 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 I do not think we are dealing with either arterial or venous infarction in this case and the MRI scan does not show any evidence of hemorrhage. What was initially interpreted as subarachnoid hemorrhage was in fact density due to the tumor. Elsewhere, there are mild to moderate chronic small-vessel ischemic changes affecting the cerebral hemispheric white matter.  Negative Sinuses/Orbits: Clear/normal   IMPRESSION: Approximately 3 cm in size mass lesion at the left posterior frontal vertex affecting the brain anterior to the precentral gyrus. Within the approximately 3 cm mass, there is a 2 cm region that behaves differently, with more mass effect and with a 1 cm region of indistinct enhancement. 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 See above for full discussion. Electronically Signed   By: Nelson Chimes M.D.   On: 12/15/2019 22:30   CT HEAD CODE STROKE WO CONTRAST Result Date: 12/15/2019  IMPRESSION: 1. Area of increased density at the left frontoparietal  vertex adjacent to the midline suspicious for some subarachnoid blood in the sulci of the region. Cannot rule out the possibility of infarction with petechial hemorrhage. Mass lesion is conceivable but least likely. 2. ASPECTS is 10 3. These results were communicated to Dr. Rory Percy at 9:04 pmon 2/10/2021by direct phone conversation. Electronically Signed   By: Nelson Chimes M.D.   On: 12/15/2019 21:09   Recent Labs  Lab 12/15/19 2056 12/15/19 2114  HGB 14.7 14.6  HCT 43.3 43.0  WBC 6.7  --   PLT 244  --    CMP Latest Ref Rng & Units 12/16/2019 12/15/2019 12/15/2019  Glucose 70 - 99 mg/dL 151(H) 107(H) 113(H)  BUN 6 - 20 mg/dL '10 14 12  ' Creatinine 0.44 - 1.00 mg/dL 0.75 0.90 0.89  Sodium 135 - 145 mmol/L 142 140 141  Potassium 3.5 - 5.1 mmol/L 4.1 2.9(L) 3.1(L)  Chloride 98 - 111 mmol/L 105 103 104  CO2 22 - 32 mmol/L 22 - 24  Calcium 8.9 - 10.3 mg/dL 9.8 - 9.4  Total Protein 6.5 - 8.1 g/dL - - 7.1  Total Bilirubin 0.3 - 1.2 mg/dL - - 0.5  Alkaline Phos 38 - 126 U/L - - 60  AST 15 - 41 U/L - - 24  ALT 0 - 44 U/L - - 18    Depression screen [facility] 2/9 12/21/2019 04/29/2016  Decreased Interest 0 0  Down, Depressed, Hopeless 0 0  PHQ - 2 Score 0 0    No Known Allergies Social History   Tobacco Use  . Smoking status: Former Smoker    Years: 2.00    Types: Cigarettes    Quit date: 11/05/1983    Years since quitting: 36.1  . Smokeless tobacco: Never Used  . Tobacco comment: 1/2 pack a week  Substance Use Topics  . Alcohol use: No    Alcohol/week: 0.0 standard drinks    Comment: 1 glass of wine weekly   Past Medical History:  Diagnosis Date  . Cervical spondylolysis 12/15/2019   Skeleton: Mild cervical spondylosis C6-7. Incidental find.   . Left ankle sprain 10/07/2011   Past Surgical History:  Procedure Laterality Date  . FOOT SURGERY Left    bone spur  . TONSILECTOMY/ADENOIDECTOMY WITH MYRINGOTOMY    . WISDOM TOOTH EXTRACTION     Family History  Problem Relation Age of  Onset  . Hypertension Mother   . Stroke Mother   . Diabetes Mother   . Cancer Father 105       colon  . Hypertension Sister   . Colon polyps Sister   . Heart disease Brother 86  . Diabetes Maternal Uncle   . Diabetes Maternal Uncle    Allergies as of 12/21/2019   No Known Allergies     Medication List       Accurate as of December 21, 2019 12:37 PM. If you have any questions, ask your nurse or doctor.        STOP taking these medications   fluticasone 50 MCG/ACT nasal spray Commonly known as: FLONASE Stopped by: Howard Pouch, DO     TAKE these medications   amoxicillin-clavulanate 875-125 MG tablet Commonly known as: AUGMENTIN Take 1 tablet by mouth 2 (two) times  daily.   levETIRAcetam 500 MG tablet Commonly known as: KEPPRA Take 1 tablet (500 mg total) by mouth 2 (two) times daily.   MULTIVITAMIN ADULT PO Take by mouth.       All past medical history, surgical history, allergies, family history, immunizations and medications were updated in the EMR today and reviewed under the history and medication portions of their EMR.      ROS: Negative, with the exception of above mentioned in HPI   Objective:  BP 124/86 (BP Location: Left Arm, Patient Position: Sitting, Cuff Size: Normal)   Pulse (!) 55   Temp (!) 97.5 F (36.4 C) (Temporal)   Resp 16   Ht '5\' 5"'  (1.651 m)   Wt 157 lb 2 oz (71.3 kg)   LMP 06/30/2015   SpO2 98%   BMI 26.15 kg/m  Body mass index is 26.15 kg/m. Gen: Afebrile. No acute distress. Nontoxic in appearance, well developed, well nourished.  Very pleasant Caucasian female. HENT: AT. [place]. Bilateral TM visualized without erythema or bulging. MMM, no oral lesions. Bilateral nares with mild erythema right nare, no swelling or drainage noted.  Reports increased pressure feeling with palpation over medial aspect of maxillary sinus and nasal bridge. Throat without erythema or exudates.  No cough or shortness of breath.  No hoarseness. Eyes:Pupils  Equal Round Reactive to light, Extraocular movements intact,  Conjunctiva without redness, discharge or icterus. Neck/lymp/endocrine: Supple, no lymphadenopathy CV: RRR no murmur Chest: CTAB, no wheeze or crackles. Good air movement, normal resp effort.  Skin: No rashes, purpura or petechiae.  Neuro:  Normal gait. PERLA. EOMi. Alert. Oriented x3  Psych: Normal affect, dress and demeanor. Normal speech. Normal thought content and judgment.  Assessment/Plan: PARRIE RASCO is a 59 y.o. female present for OV for Hospital discharge follow up History of recent hospitalization/Frontal mass of brain/Brain tumor (HCC)/Seizure disorder (HCC)/ Long-term use of high-risk medication Reviewed patient's hospital course with her today.  Overall she is adjusting to the new diagnosis.  She has had no additional seizures reported. -She understands her restrictions and has refrain from driving.  She is easing back in her routine and has returned to work and exercise. - Continue Keppra twice daily> tolerating with some side effects mostly surrounding fatigue. -She was encouraged to ensure her family understands if seizure activity occurs to monitor and if last greater than 5 minutes they need to call EMS and if postictal state last greater than 2 hours she should have a family member call her neurologist office (per neurology recommendations). -She is aware to seek immediate treatment if severe headache, nausea or vomit occur. - CBC w/Diff - Comp Met (CMET) - Ambulatory referral to Neurology>> patient has appointment March 18 with her outpatient imaging studies ordered by neuro. -Patient was provided with patient education, from up-to-date website, on low-grade glioma-she is aware this is the most likely diagnosis at this time and definitive diagnosis will be discussed by oncology as they are able to obtain further studies and evaluation.    Hypokalemia -We will recheck levels today.  Patient encouraged to  consume a potassium rich diet. - Comp Met (CMET)  Acute non-recurrent maxillary sinusitis Patient did not finish antibiotic course, only took 2 days secondary to development of seizure secondary to new finding of brain tumor.  She still complains of right medial maxillary sinus pressure today. Reviewed with her CT/MRI studies show clear orbits and sinus cavities. Encouraged her to use nasal saline a few times a day. Complete  a 7-day course of Augmentin twice daily. If symptoms continue to remain after treatment, could consider ENT referral.   Reviewed expectations re: course of current medical issues.  Discussed self-management of symptoms.  Outlined signs and symptoms indicating need for more acute intervention.  Patient verbalized understanding and all questions were answered.  Patient received an After-Visit Summary.  Any changes in medications were reviewed and patient was provided with updated med list with their AVS.      Orders Placed This Encounter  Procedures  . CBC w/Diff  . Comp Met (CMET)  . Ambulatory referral to Neurology    Referral Orders     Ambulatory referral to Neurology   Note is dictated utilizing voice recognition software. Although note has been proof read prior to signing, occasional typographical errors still can be missed. If any questions arise, please do not hesitate to call for verification.   electronically signed by:  Howard Pouch, DO  [place]

## 2019-12-22 ENCOUNTER — Other Ambulatory Visit: Payer: Self-pay | Admitting: *Deleted

## 2019-12-22 NOTE — Patient Outreach (Signed)
[facility]) Care Management  12/22/2019  Hannah [name] 09-Nov-1960 [id number]   Transition of care telephone call  Referral received:12/22/19 Initial outreach:12/22/19 Insurance: [employer]  Initial unsuccessful telephone call to patient's preferred number in order to complete transition of care assessment; no answer, left HIPAA compliant voicemail message requesting return call.   Objective:Hannah [name] was hospitalized at [facility]  from 2/10-2/09/2020 for Seizure, Frontal brain mass  Comorbidities include: Cervical spondylolysis. She was discharged to home on 12/16/19 without the need for home health services or DME.    Plan: This RNCM will route unsuccessful outreach letter with [place] Management pamphlet and 24 hour Nurse Advice Line Magn[place] to [place] Management clinical pool to be mailed to patient's home address. This RNCM will attempt another outreach within 4 business days.  Joylene Draft, RN, BSN  [place] Management Coordinator  [phone]- Mobile [phone]- Toll Free Main Office

## 2019-12-24 ENCOUNTER — Other Ambulatory Visit: Payer: Self-pay | Admitting: Radiation Therapy

## 2019-12-27 ENCOUNTER — Encounter: Payer: Self-pay | Admitting: *Deleted

## 2019-12-27 ENCOUNTER — Other Ambulatory Visit: Payer: Self-pay | Admitting: *Deleted

## 2019-12-27 NOTE — Patient Outreach (Signed)
[facility]) Care Management  12/27/2019  Hannah [name] 11-Mar-1961 [id number]  Transition of care call/case closure   Referral received:12/22/19 Initial outreach:12/22/19 Insurance: [employer] [place] [employer]    Subjective: 2nd call attempt  successful telephone call to patient's preferred number in order to complete transition of care assessment; 2 HIPAA identifiers verified. Explained purpose of call and completed transition of care assessment.  She states that she is doing  okay on today, but reports having a low grade temperature of 99, hoarseness, sore throat. She contacted  health at work and was advised to get covid test. She denies shortness of breath , report maintaining smell and taste. She reports being on quarantine and anticipates results in 24 to 48 hours she understands that health at work will be in following up  with her. She discussed if covid is negative she may consider   follow up with getting a strep test as that is what it feels like.  Reinforced adequate rest importance of nutrition on fluid intake.and notifying MD of any new concerns related to elevated temperature, cough shortness of breath .   She reports getting her appetite back now and starting to eat a little more. She reports being a very active person with walking hiking . She denies having any new signs symptoms of weakness and discussed plan for follow up MRI on next month and oncology appointment. She voiced understanding of no driving, and her husband and daughter have arranged schedule their schedule to assist.   Spouse/children are assisting with her  recovery.  She denies any ongoing health issues and says she does not need a referral to one of the [employer] chronic disease management programs.  She states that she does not have the hospital indemnity, she has already made contact regarding getting [employer] for FMLA as needed.  She  uses a [facility] outpatient pharmacy, at [address]  She denies  educational needs related to staying safe during the [place] 19 pandemic.    Objective:  [facility]  from 2/10-2/09/2020 for Seizure, Frontal brain mass  Comorbidities include: Cervical spondylolysis. She was discharged to home on 12/16/19 without the need for home health servicesor DME.    Assessment:  Patient voices good understanding of all discharge instructions.  See transition of care flowsheet for assessment details.   Plan:  Reviewed hospital discharge diagnosis of Frontal brain mass   and discharge treatment plan using hospital discharge instructions, assessing medication adherence, reviewing problems requiring provider notification, and discussing the importance of follow up with surgeon, primary care provider and/or specialists as directed. Reviewed [employer] healthy lifestyle program information to receive discounted premium for  2022  Step 1: Get annual physical between November 04, 2018 and May 04, 2020; Step 2: Complete your health assessment between November 05, 2019 and July 05, 2020 at [email or web address] Step 3:Identify your current health status and complete the corresponding action step between January 1, and July 05, 2020.    No ongoing care management needs identified so will close case to [place] Management services and she has been sent unsuccessful outreach letter with [employer] information,  Thanked patient for their services to [employer].  Joylene Draft, RN, BSN  [place] Management Coordinator  [phone]- Mobile [phone]- Toll Free Main Office

## 2020-01-13 MED FILL — levETIRAcetam 500 MG TABS: 500 | 30 days supply | Qty: 60 | Fill #0

## 2020-01-18 ENCOUNTER — Other Ambulatory Visit: Payer: Self-pay

## 2020-01-18 ENCOUNTER — Ambulatory Visit: Payer: No Typology Code available for payment source | Admitting: Internal Medicine

## 2020-01-18 ENCOUNTER — Ambulatory Visit
Admission: RE | Admit: 2020-01-18 | Discharge: 2020-01-18 | Disposition: A | Discharge status: Home / Self Care | Payer: No Typology Code available for payment source | Source: Ambulatory Visit | Attending: Internal Medicine | Admitting: Internal Medicine

## 2020-01-18 DIAGNOSIS — G9389 Other specified disorders of brain: Secondary | ICD-10-CM

## 2020-01-18 MED ORDER — GADOBENATE DIMEGLUMINE 529 MG/ML IV SOLN
14.0000 mL | Freq: Once | INTRAVENOUS | Status: AC | PRN
  Administered 2020-01-18: 14 mL via INTRAVENOUS

## 2020-01-20 ENCOUNTER — Inpatient Hospital Stay: Payer: No Typology Code available for payment source | Attending: Internal Medicine | Admitting: Internal Medicine

## 2020-01-20 ENCOUNTER — Other Ambulatory Visit: Payer: Self-pay

## 2020-01-20 VITALS — BP 135/84 | HR 55 | Temp 98.0°F | Resp 20 | Ht 65.0 in | Wt 155.1 lb

## 2020-01-20 DIAGNOSIS — Z79899 Other long term (current) drug therapy: Secondary | ICD-10-CM

## 2020-01-20 DIAGNOSIS — Z833 Family history of diabetes mellitus: Secondary | ICD-10-CM | POA: Diagnosis not present

## 2020-01-20 DIAGNOSIS — Z800 Family history of malignant neoplasm of digestive organs: Secondary | ICD-10-CM | POA: Diagnosis not present

## 2020-01-20 DIAGNOSIS — Z8249 Family history of ischemic heart disease and other diseases of the circulatory system: Secondary | ICD-10-CM

## 2020-01-20 DIAGNOSIS — G40909 Epilepsy, unspecified, not intractable, without status epilepticus: Secondary | ICD-10-CM

## 2020-01-20 DIAGNOSIS — Z87891 Personal history of nicotine dependence: Secondary | ICD-10-CM | POA: Diagnosis not present

## 2020-01-20 DIAGNOSIS — M47892 Other spondylosis, cervical region: Secondary | ICD-10-CM | POA: Diagnosis not present

## 2020-01-20 DIAGNOSIS — D496 Neoplasm of unspecified behavior of brain: Secondary | ICD-10-CM

## 2020-01-20 MED ORDER — LEVETIRACETAM 500 MG PO TABS: 500.0000 mg | ORAL_TABLET | Freq: Two times a day (BID) | ORAL | 3 refills | Status: DC

## 2020-01-20 NOTE — Progress Notes (Signed)
[facility] [place], [place] [address] [phone]   New Patient Evaluation  Date of Service: 01/20/20 Patient Name: Hannah [name] Patient MRN: [id number] Patient DOB: 1961/05/07 Provider: [name], MD  Identifying Statement:  Hannah [name] is a 59 y.o. female with left frontal mass who presents for initial consultation and evaluation.    Referring Provider: [name], DO [address] [place],  [place] [address]  Oncologic History: Oncology History   No history exists.    Biomarkers:  MGMT Unknown.  IDH 1/2 Unknown.  EGFR Unknown  TERT Unknown   History of Present Illness: The patient's records from the referring physician were obtained and reviewed and the patient interviewed to confirm this HPI.  Hannah [name] presented one month ago with sudden onset left arm and leg weakness and interrupted speech, [name], [name], stroke eval and CNS imaging which demonstrated a non-enhancing left frontal mass.  At this time she is back to baseline without recurrence of events, taking Keppra 552m twice per day.  No history or seizure or any other neurologic events.  She does describe being sleep deprived prior to seizure event.  She presents today after one month follow up MRI scan.  Medications: Current Outpatient Medications on File Prior to Visit  Medication Sig Dispense Refill  . amoxicillin-clavulanate (AUGMENTIN) 875-125 MG tablet Take 1 tablet by mouth 2 (two) times daily. 6 tablet 0  . levETIRAcetam (KEPPRA) 500 MG tablet Take 1 tablet (500 mg total) by mouth 2 (two) times daily. 60 tablet 1  . Multiple Vitamin (MULTIVITAMIN ADULT PO) Take by mouth.     No current facility-administered medications on file prior to visit.    Allergies: No Known Allergies Past Medical History:    Past Medical History:  Diagnosis Date  . Cervical spondylolysis 12/15/2019   Skeleton: Mild cervical spondylosis C6-7. Incidental find.   . Left ankle sprain 10/07/2011   Past Surgical History:  Past Surgical History:  Procedure Laterality Date  . FOOT SURGERY Left    bone spur  . TONSILECTOMY/ADENOIDECTOMY WITH MYRINGOTOMY    . WISDOM TOOTH EXTRACTION     Social History:  Social History   Socioeconomic History  . Marital status: Married    Spouse name: Not on file  . Number of children: 3  . Years of education: Not on file  . Highest education level: Not on file  Occupational History  . Not on file  Tobacco Use  . Smoking status: Former Smoker    Years: 2.00    Types: Cigarettes    Quit date: 11/05/1983    Years since quitting: 36.2  . Smokeless tobacco: Never Used  . Tobacco comment: 1/2 pack a week  Substance and Sexual Activity  . Alcohol use: No    Alcohol/week: 0.0 standard drinks    Comment: 1 glass of wine weekly  . Drug use: No  . Sexual activity: Yes    Birth control/protection: Surgical  Other Topics Concern  . Not on file  Social History Narrative  . Not on file   Social Determinants of Health   Financial Resource Strain:   . Difficulty of Paying Living Expenses:   Food Insecurity:   . Worried About RCharity fundraiserin the Last Year:   .  Ran Out of Food in the Last Year:   Transportation Needs:   . Film/video editor (Medical):   Marland Kitchen Lack of Transportation (Non-Medical):   Physical Activity:   . Days of Exercise per Week:   . Minutes of Exercise per Session:   Stress:   . Feeling of Stress :   Social Connections:   . Frequency of Communication with Friends and Family:   . Frequency of Social Gatherings with Friends and Family:   . Attends Religious Services:   . Active Member of Clubs or Organizations:   . Attends Archivist Meetings:   Marland Kitchen Marital Status:   Intimate Partner Violence:   . Fear of Current or Ex-Partner:   .  Emotionally Abused:   Marland Kitchen Physically Abused:   . Sexually Abused:    Family History:  Family History  Problem Relation Age of Onset  . Hypertension Mother   . Stroke Mother   . Diabetes Mother   . Cancer Father 31       colon  . Hypertension Sister   . Colon polyps Sister   . Heart disease Brother 40  . Diabetes Maternal Uncle   . Diabetes Maternal Uncle     Review of Systems: Constitutional: Doesn't report fevers, chills or abnormal weight loss Eyes: Doesn't report blurriness of vision Ears, nose, mouth, throat, and face: Doesn't report sore throat Respiratory: Doesn't report cough, dyspnea or wheezes Cardiovascular: Doesn't report palpitation, chest discomfort  Gastrointestinal:  Doesn't report nausea, constipation, diarrhea GU: Doesn't report incontinence Skin: Doesn't report skin rashes Neurological: Per HPI Musculoskeletal: Doesn't report joint pain Behavioral/Psych: Doesn't report anxiety  Physical Exam: There were no vitals filed for this visit. KPS: 100. General: Alert, cooperative, pleasant, in no acute distress Head: Normal EENT: No conjunctival injection or scleral icterus.  Lungs: Resp effort normal Cardiac: Regular rate Abdomen: Non-distended abdomen Skin: No rashes cyanosis or petechiae. Extremities: No clubbing or edema  Neurologic Exam: Mental Status: Awake, alert, attentive to examiner. Oriented to self and environment. Language is fluent with intact comprehension.  Cranial Nerves: Visual acuity is grossly normal. Visual fields are full. Extra-ocular movements intact. No ptosis. Face is symmetric Motor: Tone and bulk are normal. Power is full in both arms and legs. Reflexes are symmetric, no pathologic reflexes present.  Sensory: Intact to light touch Gait: Normal.   Labs: I have reviewed the data as listed    Component Value Date/Time   NA 138 12/21/2019 1134   K 4.1 12/21/2019 1134   CL 102 12/21/2019 1134   CO2 29 12/21/2019 1134   GLUCOSE  82 12/21/2019 1134   GLUCOSE 85 10/13/2006 1158   BUN 15 12/21/2019 1134   CREATININE 0.81 12/21/2019 1134   CALCIUM 9.4 12/21/2019 1134   PROT 6.9 12/21/2019 1134   ALBUMIN 4.5 12/21/2019 1134   AST 16 12/21/2019 1134   ALT 14 12/21/2019 1134   ALKPHOS 55 12/21/2019 1134   BILITOT 0.9 12/21/2019 1134   GFRNONAA >60 12/16/2019 0817   GFRAA >60 12/16/2019 0817   Lab Results  Component Value Date   WBC 4.7 12/21/2019   NEUTROABS 2.7 12/21/2019   HGB 14.5 12/21/2019   HCT 42.9 12/21/2019   MCV 93.1 12/21/2019   PLT 223.0 12/21/2019    Imaging: [facility] Clinician Interpretation: I have personally reviewed the CNS images as listed.  My interpretation, in the context of the patient's clinical presentation, is progressive disease  MR Brain W Wo Contrast  Result Date: 01/19/2020  CLINICAL DATA:  Follow-up left brain mass. Patient with headache, expressive aphasia and right arm weakness while playing tennis in February. EXAM: MRI HEAD WITHOUT AND WITH CONTRAST TECHNIQUE: Multiplanar, multiecho pulse sequences of the brain and surrounding structures were obtained without and with intravenous contrast. CONTRAST:  26m MULTIHANCE GADOBENATE DIMEGLUMINE 529 MG/ML IV SOLN COMPARISON:  Multiple exams done 12/15/2019 FINDINGS: Brain: There has been slight progressive change in the mass lesion at the left frontoparietal vertex. Main component measures approximately 2.6 cm in diameter, increased by about 2 mm, with involvement of the adjacent anterior gyrus as well as seen previously. The region of contrast enhancement is enlarged, now measuring 10 x 13 x 6 mm compared with 7 x 9 x 5 mm previously. Elsewhere, mild chronic small-vessel change of the hemispheric white matter is stable. No acute or subacute infarction. No hemorrhage, hydrocephalus or extra-axial collection. Vascular: Major vessels at the base of the brain show flow. Skull and upper cervical spine: Negative Sinuses/Orbits: Clear/normal Other: None  IMPRESSION: Slight progressive change at the left frontoparietal vertex mass lesion. The overall size is increased by perhaps 2 mm, but the region of enhancement is larger by several mm in each direction as measured above. 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 Electronically Signed   By: MNelson ChimesM.D.   On: 01/19/2020 09:34     Assessment/Plan Brain tumor ([place] [D49.6]  We appreciate the opportunity to participate in the care of Hannah [name].  She is clinically stable today, but MRI demonstrates subtle progressive features consistent with active neoplastic process.  We strongly recommended surgical intervention for both diagnostic and therapeutic benefit.  We are hopeful that entire focus of enhancement and T2 signal abnormality can be safely resected.  Referral will be placed [facility]  Screening for potential clinical trials was performed and discussed using eligibility criteria for active protocols at [facility] loco-regional tertiary centers, as well as national database available on [email or web address]    The patient is not a candidate for a research protocol at this time due to no suitable study identified.   Keppra should be continued at 5043mBID for now, will be refilled.  Case will be discussed additionally in brain/spine tumor board on 01/24/20.  We spent twenty additional minutes teaching regarding the natural history, biology, and historical experience in the treatment of brain tumors. We then discussed in detail the current recommendations for therapy focusing on the mode of administration, mechanism of action, anticipated toxicities, and quality of life issues associated with this plan. We also provided teaching sheets for the patient to take home as an additional resource.  All questions were answered. The patient knows to call the clinic with any problems, questions or concerns. No barriers to  learning were detected.  The total time spent in the encounter was 60 minutes and more than 50% was on counseling and review of test results   ZaVentura SellersMD Medical Director of Neuro-Oncology [facility] [place]3/18/21 2:33 PM

## 2020-01-24 ENCOUNTER — Telehealth: Payer: Self-pay | Admitting: *Deleted

## 2020-01-24 ENCOUNTER — Inpatient Hospital Stay: Payer: No Typology Code available for payment source

## 2020-01-24 DIAGNOSIS — D496 Neoplasm of unspecified behavior of brain: Secondary | ICD-10-CM

## 2020-01-24 NOTE — Telephone Encounter (Signed)
Patient was referred to Dr. Duffy Rhody Neurosurgeon with [facility] by Dr. Cecil Cobbs for surgical intervention.    Patients insurance requires that the PCP Dr. Howard Pouch prepare a letter explaining why the patient needs to be seen by a out of network provider "Dr. Marcello Moores" and then they need to reach out to insurance company and complete the questions via the phone for the Benefit Exception Form.   Insurance # [phone].  Once this is completed we need to notify Dr. Manon Hilding office to coordinate consultation for the patient.    Routed to LPN for Dr. Raoul Pitch to initiate this process since our office was unable to complete request.     Ccc to Dr. Mickeal Skinner,  Dr. Marcello Moores, Mont Dutton, Navigator as Juluis Rainier.

## 2020-01-26 NOTE — Telephone Encounter (Signed)
There are no Neurosurgeon that is a [facility] MD.  Dr Marcello Moores has privileges at [facility] to operate and is the preferred MD with the insurance company.  They would not allow Dr Mickeal Skinner to place the referral as it has to come from PCP.

## 2020-01-26 NOTE — Telephone Encounter (Signed)
Tried to contact [employer] but was on hold for 20+ mins.   Will try again at a later time. Sent to [employer] to review

## 2020-01-26 NOTE — Addendum Note (Signed)
Addended by: Howard Pouch A on: 01/26/2020 12:16 PM   Modules accepted: Orders

## 2020-01-26 NOTE — Telephone Encounter (Signed)
We would be happy to start this process, however in order to do this I would need to know why  her neurological team has referred her to this particular neurosurgeon (Dr. Duffy Rhody)? It is there a neurosurgeon in her network that is suitable?

## 2020-01-26 NOTE — Telephone Encounter (Signed)
Referral placed.

## 2020-01-27 NOTE — Telephone Encounter (Signed)
Spoke with Nevin Bloodgood at [facility].  Nevin Bloodgood verified that Dr. Duffy Rhody [facility]) is preferred provider for [facility] and is IN NETWORK. Nevin Bloodgood states previous requests for letter and notes are not needed.

## 2020-01-27 NOTE — Telephone Encounter (Signed)
Spoke with Angie at [employer].  Completed Benefits Exception Form over phone.  [employer] is requesting a letter from PCP (on letterhead) explaining why patient is being seen by out of network provider.  Angie advised to be as detailed as possible, including diagnosis and treatment.  Letter, PCP notes and Neuro-Onc notes to be faxed to [phone], Attn: Angie.  Advised if no response from [employer] in 1 week, to call back [phone]).   Case # [id number].

## 2020-01-27 NOTE — Telephone Encounter (Signed)
Again- 1.  If this information is needed to be produced by this provider I will need this information from patients specialty team as to why this particular neurosurgeon is needed?  I do not know why they want her to go to Dr. Sabino Snipes a different doctor that might be in network? 2.  Furthermore, I am told that this particular neurosurgeon works at [employer] and is a preferred provider.    I also can not personally attest this particular surgeon is needed over another if there is one that is preferred by her insurance. These were the decisions of her neurologist-whom likely had a very good reason why he chose this particular neurosurgeon, I just do not know those reasons in order to attest to them. I am happy to help, but I will need the information and reasons above in order to be able to help.   At this point, I can draft a letter that states: Referral  for consultation with Dr. Juliene Pina at [facility] requested. Patient's brain MRI demonstrates mass with subtle progressive features consistent with active neoplastic process.  Surgical intervention recommended for both diagnostic and therapeutic benefit has been advised by her neurology team. Dr. Marcello Moores is recommended per her neurology team.     FYI: I have already placed the neurosurgery referral, in my name, with these instructions  yesterday.  If they need it placed on letterhead with my signature we can do so. If they need more information, we need to collect the information from the insurance company and specialty teams so that this can be expedited for this patient without any further delay.

## 2020-02-03 DIAGNOSIS — W57XXXA Bitten or stung by nonvenomous insect and other nonvenomous arthropods, initial encounter: Secondary | ICD-10-CM

## 2020-02-03 HISTORY — DX: Bitten or stung by nonvenomous insect and other nonvenomous arthropods, initial encounter: W57.XXXA

## 2020-02-07 DIAGNOSIS — R030 Elevated blood-pressure reading, without diagnosis of hypertension: Secondary | ICD-10-CM | POA: Insufficient documentation

## 2020-02-08 ENCOUNTER — Ambulatory Visit (INDEPENDENT_AMBULATORY_CARE_PROVIDER_SITE_OTHER): Payer: No Typology Code available for payment source | Admitting: Family Medicine

## 2020-02-08 ENCOUNTER — Other Ambulatory Visit: Payer: Self-pay | Admitting: Neurosurgery

## 2020-02-08 ENCOUNTER — Other Ambulatory Visit: Payer: Self-pay

## 2020-02-08 ENCOUNTER — Encounter: Payer: Self-pay | Admitting: Family Medicine

## 2020-02-08 VITALS — BP 138/78 | HR 51 | Temp 98.1°F | Resp 16 | Ht 65.0 in | Wt 154.4 lb

## 2020-02-08 DIAGNOSIS — W57XXXA Bitten or stung by nonvenomous insect and other nonvenomous arthropods, initial encounter: Secondary | ICD-10-CM

## 2020-02-08 DIAGNOSIS — S1086XA Insect bite of other specified part of neck, initial encounter: Secondary | ICD-10-CM

## 2020-02-08 MED ORDER — DOXYCYCLINE HYCLATE 100 MG PO TABS: 200.0000 mg | ORAL_TABLET | Freq: Once | ORAL | 0 refills | Status: AC

## 2020-02-08 MED FILL — DOXYCYCLINE HYCLATE 100 MG: 100 | 1 days supply | Qty: 2 | Fill #0

## 2020-02-08 MED FILL — levETIRAcetam 500 MG TABS: 500 | 30 days supply | Qty: 60 | Fill #0

## 2020-02-08 NOTE — Patient Instructions (Signed)
Take both tabs of doxycyline at same time today.  Make sure to have at least something on your stomach or it will make you nauseated.    Tick Bite Information, Adult  Ticks are insects that can bite. Most ticks live in shrubs and grassy areas. They climb onto people and animals that go by. Then they bite. Some ticks carry germs that can make you sick. How can I prevent tick bites?  Use an insect repellent that has 20% or higher of the ingredients DEET, picaridin, or IR3535. Put this insect repellent on: ? Bare skin. ? The tops of your boots. ? Your pant legs. ? The ends of your sleeves.  If you use an insect repellent that has the ingredient permethrin, make sure to follow the instructions on the bottle. Treat the following: ? Clothing. ? Supplies. ? Boots. ? Tents.  Wear long sleeves, long pants, and light colors.  Tuck your pant legs into your socks.  Stay in the middle of the trail.  Try not to walk through long grass.  Before going inside your house, check your clothes, hair, and skin for ticks. Make sure to check your head, neck, armpits, waist, groin, and joint areas.  Check for ticks every day.  When you come indoors: ? Wash your clothes right away. ? Shower right away. ? Dry your clothes in a dryer on high heat for 60 minutes or more. What is the right way to remove a tick? Remove a tick from your skin as soon as possible.  To remove a tick that is crawling on your skin: ? Go outdoors and brush the tick off. ? Use tape or a lint roller.  To remove a tick that is biting: ? Wash your hands. ? If you have latex gloves, put them on. ? Use tweezers, curved forceps, or a tick-removal tool to grasp the tick. Grasp the tick as close to your skin and as close to the tick's head as possible. ? Gently pull up until the tick lets go.  Try to keep the tick's head attached to its body.  Do not twist or jerk the tick.  Do not squeeze or crush the tick. Do not try to  remove a tick with heat, alcohol, petroleum jelly, or fingernail polish. How should I get rid of a tick? Here are some ways to get rid of a tick that is alive:  Place the tick in rubbing alcohol.  Place the tick in a bag or container you can close tightly.  Wrap the tick tightly in tape.  Flush the tick down the toilet. Contact a doctor if:  You have symptoms of a disease, such as: ? Pain in a muscle, joint, or bone. ? Trouble walking or moving your legs. ? Numbness in your legs. ? Inability to move (paralysis). ? A red rash that makes a circle (bull's-eye rash). ? Redness and swelling where the tick bit you. ? A fever. ? Throwing up (vomiting) over and over. ? Diarrhea. ? Weight loss. ? Tender and swollen lymph glands. ? Shortness of breath. ? Cough. ? Belly pain (abdominal pain). ? Headache. ? Being more tired than normal. ? A change in how alert (conscious) you are. ? Confusion. Get help right away if:  You cannot remove a tick.  A part of a tick breaks off and gets stuck in your skin.  You are feeling worse. Summary  Ticks may carry germs that can make you sick.  To prevent tick  bites, wear long sleeves, long pants, and light colors. Use insect repellent. Follow the instructions on the bottle.  If the tick is biting, do not try to remove it with heat, alcohol, petroleum jelly, or fingernail polish.  Use tweezers, curved forceps, or a tick-removal tool to grasp the tick. Gently pull up until the tick lets go. Do not twist or jerk the tick. Do not squeeze or crush the tick.  If you have symptoms, contact a doctor. This information is not intended to replace advice given to you by your health care provider. Make sure you discuss any questions you have with your health care provider. Document Revised: 10/05/2018 Document Reviewed: 01/31/2017 Elsevier Patient Education  [place].

## 2020-02-08 NOTE — Progress Notes (Signed)
This visit occurred during the SARS-CoV-2 public health emergency.  Safety protocols were in place, including screening questions prior to the visit, additional usage of staff PPE, and extensive cleaning of exam room while observing appropriate contact time as indicated for disinfecting solutions.    Hannah [name] , 1961-01-18, 59 y.o., female MRN: [id number] Patient Care Team    Relationship Specialty Notifications Start End  [name], DO PCP - General Family Medicine  04/29/16   Pa, [place]  Neurosurgery  02/08/20     Chief Complaint  Patient presents with  . Tick Removal    Tick bite on L side of neck      Subjective: Pt presents for an OV with complaints of embedded tick which was removed this morning. She reports she walking in the woods on Sunday but not notice the tick until last night when it started to itch. She denies fever, headache or rash. She is having surgery next week for removal of brain tumor, which is felt to be a glioma that has increased in size since her diagnosis 12/15/2019 when she presented to the ED.   Depression screen [facility] 2/9 12/21/2019 04/29/2016  Decreased Interest 0 0  Down, Depressed, Hopeless 0 0  PHQ - 2 Score 0 0    No Known Allergies Social History   Social History Narrative  . Not on file   Past Medical History:  Diagnosis Date  . Cervical spondylolysis 12/15/2019   Skeleton: Mild cervical spondylosis C6-7. Incidental find.   . Left ankle sprain 10/07/2011   Past Surgical History:  Procedure Laterality Date  . FOOT SURGERY Left    bone spur  . TONSILECTOMY/ADENOIDECTOMY WITH MYRINGOTOMY    . WISDOM TOOTH EXTRACTION     Family History  Problem Relation Age of Onset  . Hypertension Mother   . Stroke Mother   . Diabetes Mother   . Cancer Father 7       colon  . Hypertension Sister   . Colon polyps Sister   . Heart disease Brother 1  . Diabetes Maternal Uncle   . Diabetes Maternal Uncle     Allergies as of 02/08/2020   No Known Allergies     Medication List       Accurate as of February 08, 2020  2:28 PM. If you have any questions, ask your nurse or doctor.        doxycycline 100 MG tablet Commonly known as: VIBRA-TABS Take 2 tablets (200 mg total) by mouth once for 1 dose. Started by: Howard Pouch, DO   levETIRAcetam 500 MG tablet Commonly known as: KEPPRA Take 1 tablet (500 mg total) by mouth 2 (two) times daily.   MULTIVITAMIN ADULT PO Take by mouth.       All past medical history, surgical history, allergies, family history, immunizations andmedications were updated in the EMR today and reviewed under the history and medication portions of their EMR.     ROS: Negative, with the exception of above mentioned in HPI   Objective:  BP 138/78 (BP Location: Left Arm, Patient Position: Sitting, Cuff Size: Normal)   Pulse (!) 51   Temp 98.1 F (36.7 C) (Temporal)   Resp 16   Ht 5\' 5"  (1.651 m)   Wt 154 lb 6 oz (70 kg)   LMP 06/30/2015   SpO2 98%   BMI 25.69 kg/m  Body mass index is 25.69 kg/m. Gen: Afebrile. No acute distress. Nontoxic  in appearance, well developed, well nourished.  Skin: no rashes, No purpura or petechiae. Left lower anterior neck insect bite- no insect fragments remain when viewed under magnification.  Neuro: Normal gait. PERLA. EOMi. Alert. Oriented x3  No exam data present No results found. No results found for this or any previous visit (from the past 24 hour(s)).  Assessment/Plan: Hannah [name] is a 59 y.o. female present for OV for  Tick bite, initial encounter Tick discovered already embedded greater than 24 hours ago, therefore has likely been present greater than 36 hours when went hiking on Sunday.  She does have a upcoming surgery on her brain tumor next week. Embedded tick was removed this morning.  No tick remnants remain under magnification Elected to treat with doxycycline 200 mg once - prophylactic dose.  Patient  was encouraged to have something light on her stomach  prior to taking medication.  She understands to avoid dairy or calcium rich products surrounding time of taking doxycycline.   Reviewed expectations re: course of current medical issues.  Discussed self-management of symptoms.  Outlined signs and symptoms indicating need for more acute intervention.  Patient verbalized understanding and all questions were answered.  Patient received an After-Visit Summary.    No orders of the defined types were placed in this encounter.  Meds ordered this encounter  Medications  . doxycycline (VIBRA-TABS) 100 MG tablet    Sig: Take 2 tablets (200 mg total) by mouth once for 1 dose.    Dispense:  2 tablet    Refill:  0   Referral Orders  No referral(s) requested today     Note is dictated utilizing voice recognition software. Although note has been proof read prior to signing, occasional typographical errors still can be missed. If any questions arise, please do not hesitate to call for verification.   electronically signed by:  Howard Pouch, DO  [place]

## 2020-02-11 ENCOUNTER — Other Ambulatory Visit: Payer: Self-pay | Admitting: Neurosurgery

## 2020-02-15 ENCOUNTER — Other Ambulatory Visit: Payer: Self-pay

## 2020-02-15 ENCOUNTER — Other Ambulatory Visit: Payer: Self-pay | Admitting: Neurosurgery

## 2020-02-15 ENCOUNTER — Other Ambulatory Visit (HOSPITAL_COMMUNITY): Payer: Self-pay | Admitting: Neurosurgery

## 2020-02-15 ENCOUNTER — Ambulatory Visit (HOSPITAL_COMMUNITY)
Admission: RE | Admit: 2020-02-15 | Discharge: 2020-02-15 | Disposition: A | Discharge status: Home / Self Care | Payer: No Typology Code available for payment source | Source: Ambulatory Visit | Attending: Neurosurgery | Admitting: Neurosurgery

## 2020-02-15 ENCOUNTER — Encounter (HOSPITAL_COMMUNITY): Payer: Self-pay | Admitting: *Deleted

## 2020-02-15 ENCOUNTER — Other Ambulatory Visit (HOSPITAL_COMMUNITY)
Admission: RE | Admit: 2020-02-15 | Discharge: 2020-02-15 | Disposition: A | Discharge status: Home / Self Care | Payer: No Typology Code available for payment source | Source: Ambulatory Visit | Attending: Neurosurgery | Admitting: Neurosurgery

## 2020-02-15 DIAGNOSIS — C719 Malignant neoplasm of brain, unspecified: Secondary | ICD-10-CM

## 2020-02-15 LAB — SARS CORONAVIRUS 2 (TAT 6-24 HRS): SARS Coronavirus 2: NEGATIVE

## 2020-02-15 MED ORDER — GADOBUTROL 1 MMOL/ML IV SOLN
7.0000 mL | Freq: Once | INTRAVENOUS | Status: AC | PRN
  Administered 2020-02-15: 7 mL via INTRAVENOUS

## 2020-02-15 NOTE — Progress Notes (Signed)
Spoke with pt for pre-op call. Pt denies cardiac history, HTN or Diabetes.  Covid test done today, pt states she is in quarantine and understands that she needs to stay in quarantine until she comes to the hospital tomorrow.

## 2020-02-16 ENCOUNTER — Inpatient Hospital Stay (HOSPITAL_COMMUNITY): Payer: No Typology Code available for payment source

## 2020-02-16 ENCOUNTER — Encounter (HOSPITAL_COMMUNITY): Payer: Self-pay

## 2020-02-16 ENCOUNTER — Inpatient Hospital Stay (HOSPITAL_COMMUNITY)
Admission: RE | Admit: 2020-02-16 | Discharge: 2020-02-20 | DRG: 026 | Disposition: A | Discharge status: Home / Self Care | Payer: No Typology Code available for payment source | Attending: Neurosurgery | Admitting: Neurosurgery

## 2020-02-16 ENCOUNTER — Inpatient Hospital Stay (HOSPITAL_COMMUNITY)
Admission: RE | Disposition: A | Discharge status: Home / Self Care | Payer: Self-pay | Source: Home / Self Care | Attending: Neurosurgery

## 2020-02-16 DIAGNOSIS — Z20822 Contact with and (suspected) exposure to covid-19: Secondary | ICD-10-CM | POA: Diagnosis present

## 2020-02-16 DIAGNOSIS — R001 Bradycardia, unspecified: Secondary | ICD-10-CM

## 2020-02-16 DIAGNOSIS — D496 Neoplasm of unspecified behavior of brain: Secondary | ICD-10-CM | POA: Diagnosis present

## 2020-02-16 DIAGNOSIS — Z823 Family history of stroke: Secondary | ICD-10-CM | POA: Diagnosis not present

## 2020-02-16 DIAGNOSIS — Z833 Family history of diabetes mellitus: Secondary | ICD-10-CM

## 2020-02-16 DIAGNOSIS — F17210 Nicotine dependence, cigarettes, uncomplicated: Secondary | ICD-10-CM | POA: Diagnosis present

## 2020-02-16 DIAGNOSIS — Y929 Unspecified place or not applicable: Secondary | ICD-10-CM

## 2020-02-16 DIAGNOSIS — K551 Chronic vascular disorders of intestine: Secondary | ICD-10-CM | POA: Diagnosis present

## 2020-02-16 DIAGNOSIS — Z8249 Family history of ischemic heart disease and other diseases of the circulatory system: Secondary | ICD-10-CM

## 2020-02-16 DIAGNOSIS — Z79899 Other long term (current) drug therapy: Secondary | ICD-10-CM

## 2020-02-16 DIAGNOSIS — C719 Malignant neoplasm of brain, unspecified: Secondary | ICD-10-CM | POA: Diagnosis present

## 2020-02-16 DIAGNOSIS — Z9889 Other specified postprocedural states: Secondary | ICD-10-CM | POA: Diagnosis not present

## 2020-02-16 DIAGNOSIS — R739 Hyperglycemia, unspecified: Secondary | ICD-10-CM | POA: Diagnosis present

## 2020-02-16 DIAGNOSIS — T380X5A Adverse effect of glucocorticoids and synthetic analogues, initial encounter: Secondary | ICD-10-CM | POA: Diagnosis present

## 2020-02-16 DIAGNOSIS — R4701 Aphasia: Secondary | ICD-10-CM | POA: Diagnosis present

## 2020-02-16 DIAGNOSIS — R569 Unspecified convulsions: Secondary | ICD-10-CM

## 2020-02-16 DIAGNOSIS — D72829 Elevated white blood cell count, unspecified: Secondary | ICD-10-CM

## 2020-02-16 DIAGNOSIS — Z596 Low income: Secondary | ICD-10-CM

## 2020-02-16 DIAGNOSIS — D72828 Other elevated white blood cell count: Secondary | ICD-10-CM | POA: Diagnosis not present

## 2020-02-16 DIAGNOSIS — R220 Localized swelling, mass and lump, head: Secondary | ICD-10-CM | POA: Diagnosis not present

## 2020-02-16 HISTORY — PX: APPLICATION OF CRANIAL NAVIGATION: SHX6578

## 2020-02-16 HISTORY — DX: Unspecified convulsions: R56.9

## 2020-02-16 HISTORY — PX: CRANIOTOMY: SHX93

## 2020-02-16 LAB — POCT I-STAT 7, (LYTES, BLD GAS, ICA,H+H)
Bicarbonate: 25.3 mmol/L (ref 20.0–28.0)
Calcium, Ion: 1.19 mmol/L (ref 1.15–1.40)
HCT: 37 % (ref 36.0–46.0)
Hemoglobin: 12.6 g/dL (ref 12.0–15.0)
O2 Saturation: 100 %
Patient temperature: 36.1
Potassium: 3.5 mmol/L (ref 3.5–5.1)
Sodium: 142 mmol/L (ref 135–145)
TCO2: 27 mmol/L (ref 22–32)
pCO2 arterial: 41.4 mmHg (ref 32.0–48.0)
pH, Arterial: 7.39 (ref 7.350–7.450)
pO2, Arterial: 371 mmHg — ABNORMAL HIGH (ref 83.0–108.0)

## 2020-02-16 LAB — CBC
HCT: 39.7 % (ref 36.0–46.0)
Hemoglobin: 13.5 g/dL (ref 12.0–15.0)
MCH: 31.1 pg (ref 26.0–34.0)
MCHC: 34 g/dL (ref 30.0–36.0)
MCV: 91.5 fL (ref 80.0–100.0)
Platelets: 212 10*3/uL (ref 150–400)
RBC: 4.34 MIL/uL (ref 3.87–5.11)
RDW: 11.6 % (ref 11.5–15.5)
WBC: 5.3 10*3/uL (ref 4.0–10.5)
nRBC: 0 % (ref 0.0–0.2)

## 2020-02-16 LAB — PREPARE RBC (CROSSMATCH)

## 2020-02-16 LAB — ABO/RH: ABO/RH(D): B POS

## 2020-02-16 LAB — MRSA PCR SCREENING: MRSA by PCR: NEGATIVE

## 2020-02-16 SURGERY — CRANIOTOMY TUMOR EXCISION
Anesthesia: General | Laterality: Left

## 2020-02-16 MED ORDER — PHENYLEPHRINE HCL-NACL 10-0.9 MG/250ML-% IV SOLN
INTRAVENOUS | Status: DC | PRN
  Administered 2020-02-16: 30 ug/min via INTRAVENOUS

## 2020-02-16 MED ORDER — BACITRACIN ZINC 500 UNIT/GM EX OINT
TOPICAL_OINTMENT | CUTANEOUS | Status: DC | PRN
  Administered 2020-02-16: 1 via TOPICAL

## 2020-02-16 MED ORDER — ENOXAPARIN SODIUM 40 MG/0.4ML ~~LOC~~ SOLN
40.0000 mg | SUBCUTANEOUS | Status: DC
  Administered 2020-02-17 – 2020-02-19 (×3): 40 mg via SUBCUTANEOUS
  Filled 2020-02-16 (×4): qty 0.4

## 2020-02-16 MED ORDER — SODIUM CHLORIDE 0.9% IV SOLUTION: Freq: Once | INTRAVENOUS | Status: DC

## 2020-02-16 MED ORDER — ONDANSETRON HCL 4 MG/2ML IJ SOLN
INTRAMUSCULAR | Status: AC
  Filled 2020-02-16: qty 4

## 2020-02-16 MED ORDER — NICARDIPINE HCL IN NACL 20-0.86 MG/200ML-% IV SOLN
INTRAVENOUS | Status: AC
  Filled 2020-02-16: qty 200

## 2020-02-16 MED ORDER — THROMBIN 5000 UNITS EX SOLR
CUTANEOUS | Status: AC
  Filled 2020-02-16: qty 5000

## 2020-02-16 MED ORDER — ACETAMINOPHEN 325 MG PO TABS
650.0000 mg | ORAL_TABLET | ORAL | Status: DC | PRN
  Administered 2020-02-17 – 2020-02-20 (×6): 650 mg via ORAL
  Filled 2020-02-16 (×6): qty 2

## 2020-02-16 MED ORDER — CHLORHEXIDINE GLUCONATE CLOTH 2 % EX PADS: 6.0000 | MEDICATED_PAD | Freq: Once | CUTANEOUS | Status: DC

## 2020-02-16 MED ORDER — FENTANYL CITRATE (PF) 250 MCG/5ML IJ SOLN
INTRAMUSCULAR | Status: AC
  Filled 2020-02-16: qty 5

## 2020-02-16 MED ORDER — FENTANYL CITRATE (PF) 100 MCG/2ML IJ SOLN
25.0000 ug | INTRAMUSCULAR | Status: DC | PRN
  Administered 2020-02-16: 50 ug via INTRAVENOUS

## 2020-02-16 MED ORDER — HEMOSTATIC AGENTS (NO CHARGE) OPTIME
TOPICAL | Status: DC | PRN
  Administered 2020-02-16: 1 via TOPICAL

## 2020-02-16 MED ORDER — PHENYLEPHRINE 40 MCG/ML (10ML) SYRINGE FOR IV PUSH (FOR BLOOD PRESSURE SUPPORT)
PREFILLED_SYRINGE | INTRAVENOUS | Status: AC
  Filled 2020-02-16: qty 30

## 2020-02-16 MED ORDER — ENALAPRILAT 1.25 MG/ML IV SOLN
1.2500 mg | Freq: Four times a day (QID) | INTRAVENOUS | Status: DC | PRN
  Filled 2020-02-16: qty 1

## 2020-02-16 MED ORDER — LIDOCAINE-EPINEPHRINE 1 %-1:100000 IJ SOLN
INTRAMUSCULAR | Status: AC
  Filled 2020-02-16: qty 1

## 2020-02-16 MED ORDER — POLYETHYLENE GLYCOL 3350 17 G PO PACK: 17.0000 g | PACK | Freq: Every day | ORAL | Status: DC | PRN

## 2020-02-16 MED ORDER — ONDANSETRON HCL 4 MG/2ML IJ SOLN: 4.0000 mg | Freq: Once | INTRAMUSCULAR | Status: DC | PRN

## 2020-02-16 MED ORDER — CHLORHEXIDINE GLUCONATE CLOTH 2 % EX PADS
6.0000 | MEDICATED_PAD | Freq: Every day | CUTANEOUS | Status: DC
  Administered 2020-02-16 – 2020-02-19 (×4): 6 via TOPICAL

## 2020-02-16 MED ORDER — EPHEDRINE 5 MG/ML INJ
INTRAVENOUS | Status: AC
  Filled 2020-02-16: qty 10

## 2020-02-16 MED ORDER — ROCURONIUM BROMIDE 10 MG/ML (PF) SYRINGE
PREFILLED_SYRINGE | INTRAVENOUS | Status: AC
  Filled 2020-02-16: qty 40

## 2020-02-16 MED ORDER — PROPOFOL 500 MG/50ML IV EMUL
INTRAVENOUS | Status: DC | PRN
  Administered 2020-02-16: 50 ug/kg/min via INTRAVENOUS

## 2020-02-16 MED ORDER — CEFAZOLIN SODIUM-DEXTROSE 2-4 GM/100ML-% IV SOLN
2.0000 g | INTRAVENOUS | Status: AC
  Administered 2020-02-16: 14:00:00 2 g via INTRAVENOUS

## 2020-02-16 MED ORDER — 0.9 % SODIUM CHLORIDE (POUR BTL) OPTIME
TOPICAL | Status: DC | PRN
  Administered 2020-02-16: 3000 mL

## 2020-02-16 MED ORDER — LABETALOL HCL 5 MG/ML IV SOLN: 10.0000 mg | INTRAVENOUS | Status: DC | PRN

## 2020-02-16 MED ORDER — OXYCODONE-ACETAMINOPHEN 5-325 MG PO TABS
1.0000 | ORAL_TABLET | Freq: Four times a day (QID) | ORAL | Status: DC | PRN
  Administered 2020-02-17: 21:00:00 1 via ORAL
  Administered 2020-02-19: 2 via ORAL
  Filled 2020-02-16: qty 1
  Filled 2020-02-16: qty 2

## 2020-02-16 MED ORDER — ESMOLOL HCL 100 MG/10ML IV SOLN
INTRAVENOUS | Status: AC
  Filled 2020-02-16: qty 10

## 2020-02-16 MED ORDER — THROMBIN 5000 UNITS EX SOLR
OROMUCOSAL | Status: DC | PRN
  Administered 2020-02-16 (×2): 5 mL via TOPICAL

## 2020-02-16 MED ORDER — POTASSIUM CHLORIDE IN NACL 20-0.9 MEQ/L-% IV SOLN
INTRAVENOUS | Status: DC
  Filled 2020-02-16 (×4): qty 1000

## 2020-02-16 MED ORDER — BACITRACIN ZINC 500 UNIT/GM EX OINT
TOPICAL_OINTMENT | CUTANEOUS | Status: AC
  Filled 2020-02-16: qty 28.35

## 2020-02-16 MED ORDER — DOCUSATE SODIUM 100 MG PO CAPS
100.0000 mg | ORAL_CAPSULE | Freq: Two times a day (BID) | ORAL | Status: DC
  Administered 2020-02-17 – 2020-02-20 (×5): 100 mg via ORAL
  Filled 2020-02-16 (×5): qty 1

## 2020-02-16 MED ORDER — LIDOCAINE-EPINEPHRINE 1 %-1:100000 IJ SOLN
INTRAMUSCULAR | Status: DC | PRN
  Administered 2020-02-16: 10 mL

## 2020-02-16 MED ORDER — EPHEDRINE SULFATE-NACL 50-0.9 MG/10ML-% IV SOSY
PREFILLED_SYRINGE | INTRAVENOUS | Status: DC | PRN
  Administered 2020-02-16: 10 mg via INTRAVENOUS

## 2020-02-16 MED ORDER — SODIUM CHLORIDE 0.9 % IV SOLN: INTRAVENOUS | Status: DC | PRN

## 2020-02-16 MED ORDER — FENTANYL CITRATE (PF) 100 MCG/2ML IJ SOLN
INTRAMUSCULAR | Status: AC
  Filled 2020-02-16: qty 2

## 2020-02-16 MED ORDER — PANTOPRAZOLE SODIUM 40 MG IV SOLR
40.0000 mg | Freq: Every day | INTRAVENOUS | Status: DC
  Administered 2020-02-16 – 2020-02-17 (×2): 40 mg via INTRAVENOUS
  Filled 2020-02-16 (×2): qty 40

## 2020-02-16 MED ORDER — ONDANSETRON HCL 4 MG/2ML IJ SOLN
4.0000 mg | INTRAMUSCULAR | Status: DC | PRN
  Administered 2020-02-16 – 2020-02-17 (×2): 4 mg via INTRAVENOUS
  Filled 2020-02-16 (×2): qty 2

## 2020-02-16 MED ORDER — LEVETIRACETAM 500 MG PO TABS: 500.0000 mg | ORAL_TABLET | Freq: Two times a day (BID) | ORAL | Status: DC

## 2020-02-16 MED ORDER — PROPOFOL 10 MG/ML IV BOLUS
INTRAVENOUS | Status: AC
  Filled 2020-02-16: qty 20

## 2020-02-16 MED ORDER — THROMBIN 20000 UNITS EX SOLR
CUTANEOUS | Status: AC
  Filled 2020-02-16: qty 20000

## 2020-02-16 MED ORDER — BUPIVACAINE HCL (PF) 0.5 % IJ SOLN
INTRAMUSCULAR | Status: AC
  Filled 2020-02-16: qty 30

## 2020-02-16 MED ORDER — SODIUM CHLORIDE 0.9 % IV SOLN
INTRAVENOUS | Status: DC | PRN
  Administered 2020-02-16: 500 mL

## 2020-02-16 MED ORDER — LIDOCAINE 2% (20 MG/ML) 5 ML SYRINGE
INTRAMUSCULAR | Status: DC | PRN
  Administered 2020-02-16: 100 mg via INTRAVENOUS

## 2020-02-16 MED ORDER — DEXAMETHASONE SODIUM PHOSPHATE 10 MG/ML IJ SOLN
INTRAMUSCULAR | Status: AC
  Filled 2020-02-16: qty 2

## 2020-02-16 MED ORDER — ONDANSETRON HCL 4 MG PO TABS: 4.0000 mg | ORAL_TABLET | ORAL | Status: DC | PRN

## 2020-02-16 MED ORDER — DEXAMETHASONE SODIUM PHOSPHATE 4 MG/ML IJ SOLN: 4.0000 mg | Freq: Three times a day (TID) | INTRAMUSCULAR | Status: DC

## 2020-02-16 MED ORDER — DEXAMETHASONE SODIUM PHOSPHATE 10 MG/ML IJ SOLN
6.0000 mg | Freq: Four times a day (QID) | INTRAMUSCULAR | Status: AC
  Administered 2020-02-16 – 2020-02-17 (×4): 6 mg via INTRAVENOUS
  Filled 2020-02-16 (×4): qty 1

## 2020-02-16 MED ORDER — LIDOCAINE 2% (20 MG/ML) 5 ML SYRINGE
INTRAMUSCULAR | Status: AC
  Filled 2020-02-16: qty 15

## 2020-02-16 MED ORDER — SODIUM CHLORIDE 0.9 % IV SOLN
0.0125 ug/kg/min | INTRAVENOUS | Status: AC
  Administered 2020-02-16: 14:00:00 .1 ug/kg/min via INTRAVENOUS
  Filled 2020-02-16: qty 2000

## 2020-02-16 MED ORDER — THROMBIN 20000 UNITS EX SOLR
CUTANEOUS | Status: DC | PRN
  Administered 2020-02-16 (×2): 20 mL via TOPICAL

## 2020-02-16 MED ORDER — ACETAMINOPHEN 650 MG RE SUPP: 650.0000 mg | RECTAL | Status: DC | PRN

## 2020-02-16 MED ORDER — MEPERIDINE HCL 25 MG/ML IJ SOLN: 6.2500 mg | INTRAMUSCULAR | Status: DC | PRN

## 2020-02-16 MED ORDER — MORPHINE SULFATE (PF) 2 MG/ML IV SOLN
1.0000 mg | INTRAVENOUS | Status: DC | PRN
  Administered 2020-02-16: 2 mg via INTRAVENOUS
  Administered 2020-02-17: 1 mg via INTRAVENOUS
  Filled 2020-02-16 (×4): qty 1

## 2020-02-16 MED ORDER — PHENYLEPHRINE HCL (PRESSORS) 10 MG/ML IV SOLN
INTRAVENOUS | Status: DC | PRN
  Administered 2020-02-16 (×2): 40 ug via INTRAVENOUS
  Administered 2020-02-16 (×2): 80 ug via INTRAVENOUS

## 2020-02-16 MED ORDER — NICARDIPINE HCL IN NACL 20-0.86 MG/200ML-% IV SOLN
3.0000 mg/h | INTRAVENOUS | Status: DC
  Administered 2020-02-16: 5 mg/h via INTRAVENOUS

## 2020-02-16 MED ORDER — SENNA 8.6 MG PO TABS
1.0000 | ORAL_TABLET | Freq: Two times a day (BID) | ORAL | Status: DC
  Administered 2020-02-17 – 2020-02-20 (×5): 8.6 mg via ORAL
  Filled 2020-02-16 (×5): qty 1

## 2020-02-16 MED ORDER — ROCURONIUM BROMIDE 50 MG/5ML IV SOSY
PREFILLED_SYRINGE | INTRAVENOUS | Status: DC | PRN
  Administered 2020-02-16: 20 mg via INTRAVENOUS
  Administered 2020-02-16: 30 mg via INTRAVENOUS
  Administered 2020-02-16: 50 mg via INTRAVENOUS
  Administered 2020-02-16: 20 mg via INTRAVENOUS
  Administered 2020-02-16: 30 mg via INTRAVENOUS

## 2020-02-16 MED ORDER — SODIUM CHLORIDE 0.9 % IV SOLN: INTRAVENOUS | Status: DC

## 2020-02-16 MED ORDER — MIDAZOLAM HCL 2 MG/2ML IJ SOLN
INTRAMUSCULAR | Status: AC
  Filled 2020-02-16: qty 2

## 2020-02-16 MED ORDER — LEVETIRACETAM IN NACL 500 MG/100ML IV SOLN
500.0000 mg | Freq: Two times a day (BID) | INTRAVENOUS | Status: DC
  Administered 2020-02-16 – 2020-02-17 (×3): 500 mg via INTRAVENOUS
  Filled 2020-02-16 (×3): qty 100

## 2020-02-16 MED ORDER — CEFAZOLIN SODIUM-DEXTROSE 2-4 GM/100ML-% IV SOLN
INTRAVENOUS | Status: AC
  Filled 2020-02-16: qty 100

## 2020-02-16 MED ORDER — CEFAZOLIN SODIUM-DEXTROSE 1-4 GM/50ML-% IV SOLN
1.0000 g | Freq: Three times a day (TID) | INTRAVENOUS | Status: AC
  Administered 2020-02-16 – 2020-02-17 (×3): 1 g via INTRAVENOUS
  Filled 2020-02-16 (×3): qty 50

## 2020-02-16 MED ORDER — PROMETHAZINE HCL 25 MG PO TABS: 12.5000 mg | ORAL_TABLET | ORAL | Status: DC | PRN

## 2020-02-16 MED ORDER — SODIUM CHLORIDE 0.9 % IV SOLN
INTRAVENOUS | Status: DC | PRN
  Administered 2020-02-16: 30 ug/min via INTRAVENOUS

## 2020-02-16 MED ORDER — FLEET ENEMA 7-19 GM/118ML RE ENEM: 1.0000 | ENEMA | Freq: Once | RECTAL | Status: DC | PRN

## 2020-02-16 MED ORDER — FENTANYL CITRATE (PF) 100 MCG/2ML IJ SOLN
INTRAMUSCULAR | Status: DC | PRN
  Administered 2020-02-16: 50 ug via INTRAVENOUS

## 2020-02-16 MED ORDER — DEXAMETHASONE SODIUM PHOSPHATE 4 MG/ML IJ SOLN
4.0000 mg | Freq: Four times a day (QID) | INTRAMUSCULAR | Status: DC
  Administered 2020-02-17 – 2020-02-18 (×2): 4 mg via INTRAVENOUS
  Filled 2020-02-16 (×2): qty 1

## 2020-02-16 MED ORDER — SODIUM CHLORIDE 0.9 % IV SOLN
0.0125 ug/kg/min | INTRAVENOUS | Status: DC
  Filled 2020-02-16: qty 1000

## 2020-02-16 MED ORDER — PROPOFOL 10 MG/ML IV BOLUS
INTRAVENOUS | Status: DC | PRN
  Administered 2020-02-16: 40 mg via INTRAVENOUS
  Administered 2020-02-16: 60 mg via INTRAVENOUS
  Administered 2020-02-16: 140 mg via INTRAVENOUS
  Administered 2020-02-16: 20 mg via INTRAVENOUS

## 2020-02-16 MED ORDER — MANNITOL 25 % IV SOLN
INTRAVENOUS | Status: DC | PRN
  Administered 2020-02-16: 34.7 g via INTRAVENOUS

## 2020-02-16 MED ORDER — SUGAMMADEX SODIUM 200 MG/2ML IV SOLN
INTRAVENOUS | Status: DC | PRN
  Administered 2020-02-16: 200 mg via INTRAVENOUS

## 2020-02-16 MED ORDER — ESMOLOL HCL 100 MG/10ML IV SOLN
INTRAVENOUS | Status: DC | PRN
  Administered 2020-02-16: 20 mg via INTRAVENOUS

## 2020-02-16 SURGICAL SUPPLY — 106 items
APL SKNCLS STERI-STRIP NONHPOA (GAUZE/BANDAGES/DRESSINGS)
BAND INSRT 18 STRL LF DISP RB (MISCELLANEOUS) ×3
BAND RUBBER #18 3X1/16 STRL (MISCELLANEOUS) ×3 IMPLANT
BENZOIN TINCTURE PRP APPL 2/3 (GAUZE/BANDAGES/DRESSINGS) IMPLANT
BLADE CLIPPER SURG (BLADE) ×2 IMPLANT
BLADE SAW GIGLI 16 STRL (MISCELLANEOUS) IMPLANT
BLADE SURG 15 STRL LF DISP TIS (BLADE) IMPLANT
BLADE SURG 15 STRL SS (BLADE)
BLADE ULTRA TIP 2M (BLADE) ×2 IMPLANT
BNDG CMPR 75X41 PLY HI ABS (GAUZE/BANDAGES/DRESSINGS)
BNDG GAUZE ELAST 4 BULKY (GAUZE/BANDAGES/DRESSINGS) IMPLANT
BNDG STRETCH 4X75 STRL LF (GAUZE/BANDAGES/DRESSINGS) IMPLANT
BUR ACORN 6.0 PRECISION (BURR) ×2 IMPLANT
BUR ROUND FLUTED 4 SOFT TCH (BURR) IMPLANT
BUR SPIRAL ROUTER 2.3 (BUR) ×2 IMPLANT
CANISTER SUCT 3000ML PPV (MISCELLANEOUS) ×4 IMPLANT
CARTRIDGE OIL MAESTRO DRILL (MISCELLANEOUS) ×1 IMPLANT
CATH VENTRIC 35X38 W/TROCAR LG (CATHETERS) IMPLANT
CLIP VESOCCLUDE MED 6/CT (CLIP) IMPLANT
CNTNR URN SCR LID CUP LEK RST (MISCELLANEOUS) ×1 IMPLANT
CONT SPEC 4OZ STRL OR WHT (MISCELLANEOUS) ×2
COVER MAYO STAND STRL (DRAPES) IMPLANT
COVER WAND RF STERILE (DRAPES) ×1 IMPLANT
DECANTER SPIKE VIAL GLASS SM (MISCELLANEOUS) ×2 IMPLANT
DIFFUSER DRILL AIR PNEUMATIC (MISCELLANEOUS) ×2 IMPLANT
DRAIN SUBARACHNOID (WOUND CARE) IMPLANT
DRAPE HALF SHEET 40X57 (DRAPES) ×2 IMPLANT
DRAPE MICROSCOPE LEICA (MISCELLANEOUS) ×1 IMPLANT
DRAPE NEUROLOGICAL W/INCISE (DRAPES) ×2 IMPLANT
DRAPE STERI IOBAN 125X83 (DRAPES) IMPLANT
DRAPE SURG 17X23 STRL (DRAPES) IMPLANT
DRAPE WARM FLUID 44X44 (DRAPES) ×2 IMPLANT
DRSG ADAPTIC 3X8 NADH LF (GAUZE/BANDAGES/DRESSINGS) IMPLANT
DRSG TELFA 3X8 NADH (GAUZE/BANDAGES/DRESSINGS) ×2 IMPLANT
DURAPREP 6ML APPLICATOR 50/CS (WOUND CARE) ×2 IMPLANT
ELECT REM PT RETURN 9FT ADLT (ELECTROSURGICAL) ×2
ELECTRODE REM PT RTRN 9FT ADLT (ELECTROSURGICAL) ×1 IMPLANT
EVACUATOR 1/8 PVC DRAIN (DRAIN) IMPLANT
EVACUATOR SILICONE 100CC (DRAIN) IMPLANT
FEE INTRAOP MONITOR IMPULS NCS (MISCELLANEOUS) IMPLANT
FORCEPS BIPOLAR SPETZLER 8 1.0 (NEUROSURGERY SUPPLIES) ×2 IMPLANT
FORCEPS MALIS IRRIG 1.5 (NEUROSURGERY SUPPLIES) ×1 IMPLANT
GAUZE 4X4 16PLY RFD (DISPOSABLE) IMPLANT
GAUZE SPONGE 4X4 12PLY STRL (GAUZE/BANDAGES/DRESSINGS) ×2 IMPLANT
GLOVE BIO SURGEON STRL SZ7.5 (GLOVE) IMPLANT
GLOVE BIOGEL PI IND STRL 7.5 (GLOVE) ×2 IMPLANT
GLOVE BIOGEL PI INDICATOR 7.5 (GLOVE) ×2
GLOVE ECLIPSE 7.0 STRL STRAW (GLOVE) ×4 IMPLANT
GLOVE ECLIPSE 7.5 STRL STRAW (GLOVE) IMPLANT
GLOVE EXAM NITRILE XL STR (GLOVE) IMPLANT
GOWN STRL REUS W/ TWL LRG LVL3 (GOWN DISPOSABLE) ×2 IMPLANT
GOWN STRL REUS W/ TWL XL LVL3 (GOWN DISPOSABLE) IMPLANT
GOWN STRL REUS W/TWL 2XL LVL3 (GOWN DISPOSABLE) IMPLANT
GOWN STRL REUS W/TWL LRG LVL3 (GOWN DISPOSABLE) ×4
GOWN STRL REUS W/TWL XL LVL3 (GOWN DISPOSABLE)
GRAFT DURAGEN MATRIX 2WX2L ×1 IMPLANT
HEMOSTAT POWDER KIT SURGIFOAM (HEMOSTASIS) ×3 IMPLANT
HEMOSTAT SURGICEL 2X14 (HEMOSTASIS) ×2 IMPLANT
HOOK DURA 1/2IN (MISCELLANEOUS) ×1 IMPLANT
INTRAOP MONITOR FEE IMPULS NCS (MISCELLANEOUS) ×1
INTRAOP MONITOR FEE IMPULSE (MISCELLANEOUS) ×2
IV NS 1000ML (IV SOLUTION) ×2
IV NS 1000ML BAXH (IV SOLUTION) ×1 IMPLANT
KIT BASIN OR (CUSTOM PROCEDURE TRAY) ×2 IMPLANT
KIT DRAIN CSF ACCUDRAIN (MISCELLANEOUS) IMPLANT
KIT TURNOVER KIT B (KITS) ×2 IMPLANT
KNIFE ARACHNOID DISP AM-24-S (MISCELLANEOUS) ×2 IMPLANT
MARKER SKIN DUAL TIP RULER LAB (MISCELLANEOUS) ×1 IMPLANT
MARKER SPHERE PSV REFLC 13MM (MARKER) ×6 IMPLANT
NDL SPNL 18GX3.5 QUINCKE PK (NEEDLE) IMPLANT
NEEDLE HYPO 22GX1.5 SAFETY (NEEDLE) ×2 IMPLANT
NEEDLE SPNL 18GX3.5 QUINCKE PK (NEEDLE) IMPLANT
NS IRRIG 1000ML POUR BTL (IV SOLUTION) ×6 IMPLANT
OIL CARTRIDGE MAESTRO DRILL (MISCELLANEOUS) ×2
PACK BATTERY CMF DISP FOR DVR (ORTHOPEDIC DISPOSABLE SUPPLIES) ×1 IMPLANT
PACK CRANIOTOMY CUSTOM (CUSTOM PROCEDURE TRAY) ×2 IMPLANT
PAD DRESSING TELFA 3X8 NADH (GAUZE/BANDAGES/DRESSINGS) IMPLANT
PATTIES SURGICAL .25X.25 (GAUZE/BANDAGES/DRESSINGS) IMPLANT
PATTIES SURGICAL .5 X.5 (GAUZE/BANDAGES/DRESSINGS) IMPLANT
PATTIES SURGICAL .5 X3 (DISPOSABLE) ×1 IMPLANT
PATTIES SURGICAL 1/4 X 3 (GAUZE/BANDAGES/DRESSINGS) IMPLANT
PATTIES SURGICAL 1X1 (DISPOSABLE) IMPLANT
PIN MAYFIELD SKULL DISP (PIN) ×2 IMPLANT
PLATE DOUBLE Y CMF 6H (Plate) ×3 IMPLANT
SCREW UNIII AXS SD 1.5X4 (Screw) ×12 IMPLANT
SPECIMEN JAR SMALL (MISCELLANEOUS) ×2 IMPLANT
SPONGE NEURO XRAY DETECT 1X3 (DISPOSABLE) ×1 IMPLANT
SPONGE SURGIFOAM ABS GEL 100 (HEMOSTASIS) ×3 IMPLANT
STAPLER VISISTAT 35W (STAPLE) ×3 IMPLANT
STOCKINETTE 6  STRL (DRAPES)
STOCKINETTE 6 STRL (DRAPES) IMPLANT
SUT ETHILON 3 0 FSL (SUTURE) IMPLANT
SUT ETHILON 3 0 PS 1 (SUTURE) IMPLANT
SUT NURALON 4 0 TR CR/8 (SUTURE) ×7 IMPLANT
SUT SILK 0 TIES 10X30 (SUTURE) IMPLANT
SUT VIC AB 0 CT1 18XCR BRD8 (SUTURE) ×2 IMPLANT
SUT VIC AB 0 CT1 8-18 (SUTURE)
SUT VIC AB 3-0 SH 8-18 (SUTURE) ×2 IMPLANT
SYR CONTROL 10ML LL (SYRINGE) ×1 IMPLANT
TAPE CLOTH 1X10 TAN NS (GAUZE/BANDAGES/DRESSINGS) ×1 IMPLANT
TOWEL GREEN STERILE (TOWEL DISPOSABLE) ×2 IMPLANT
TOWEL GREEN STERILE FF (TOWEL DISPOSABLE) ×2 IMPLANT
TRAY FOLEY MTR SLVR 16FR STAT (SET/KITS/TRAYS/PACK) ×2 IMPLANT
TUBE CONNECTING 12X1/4 (SUCTIONS) ×2 IMPLANT
UNDERPAD 30X30 (UNDERPADS AND DIAPERS) ×2 IMPLANT
WATER STERILE IRR 1000ML POUR (IV SOLUTION) ×2 IMPLANT

## 2020-02-16 NOTE — Transfer of Care (Signed)
Immediate Anesthesia Transfer of Care Note  Patient: Hannah [name]  Procedure(s) Performed: LEFT FRONTAL CRANIOTOMY FOR BRAIN TUMOR (Left ) APPLICATION OF CRANIAL NAVIGATION (Left )  Patient Location: PACU  Anesthesia Type:General  Level of Consciousness: awake, alert  and patient cooperative  Airway & Oxygen Therapy: Patient Spontanous Breathing and Patient connected to nasal cannula oxygen  Post-op Assessment: Report given to RN and Post -op Vital signs reviewed and stable  Post vital signs: Reviewed and stable  Last Vitals:  Vitals Value Taken Time  BP 151/96 02/16/20 1801  Temp    Pulse 83 02/16/20 1804  Resp 20 02/16/20 1804  SpO2 97 % 02/16/20 1804  Vitals shown include unvalidated device data.  Last Pain:  Vitals:   02/16/20 0956  TempSrc:   PainSc: 0-No pain         Complications: No apparent anesthesia complications

## 2020-02-16 NOTE — Anesthesia Procedure Notes (Signed)
Procedure Name: Intubation Date/Time: 02/16/2020 1:45 PM Performed by: Gaylene Brooks, CRNA Pre-anesthesia Checklist: Patient identified, Emergency Drugs available, Suction available and Patient being monitored Patient Re-evaluated:Patient Re-evaluated prior to induction Oxygen Delivery Method: Circle System Utilized Preoxygenation: Pre-oxygenation with 100% oxygen Induction Type: IV induction Ventilation: Mask ventilation without difficulty Laryngoscope Size: Miller and 2 Grade View: Grade I Tube type: Oral Tube size: 7.0 mm Number of attempts: 1 Airway Equipment and Method: Stylet and Oral airway Placement Confirmation: ETT inserted through vocal cords under direct vision,  positive ETCO2 and breath sounds checked- equal and bilateral Secured at: 21 cm Tube secured with: Tape Dental Injury: Teeth and Oropharynx as per pre-operative assessment  Comments: Cade performed

## 2020-02-16 NOTE — Anesthesia Procedure Notes (Signed)
Arterial Line Insertion Start/End4/14/2021 10:05 AM, 02/16/2020 10:16 AM Performed by: [name], MD, Kyung Rudd, CRNA, CRNA  Preanesthetic checklist: patient identified, risks and benefits discussed, monitors and equipment checked and pre-op evaluation Lidocaine 1% used for infiltration radial was placed Catheter size: 20 G Hand hygiene performed  and maximum sterile barriers used   Attempts: 1 Procedure performed without using ultrasound guided technique. Ultrasound Notes:anatomy identified, needle tip was noted to be adjacent to the nerve/plexus identified and no ultrasound evidence of intravascular and/or intraneural injection Following insertion, dressing applied. Post procedure assessment: normal

## 2020-02-16 NOTE — Op Note (Signed)
Procedure(s):  Victorian Schinkel Makela female 59 y.o. 02/16/2020  Procedure(s):    1. Craniotomy for resection of right frontal brain mass    2.  Motor mapping with phase reversal SSEP    3.  Computer-assisted neuronavigation  Surgeon(s) and Role:    Marcello Moores, Dorcas Carrow, MD - Primary    Indications: This is a 59 year old woman who presented initially with a seizure and was found to have a left superior frontal gyrus/SMA expansile lesion, progressive on subsequent imaging.  After interdisciplinary discussion at tumor board, surgical resection for diagnosis and potential therapeutic benefit was recommended.  After discussion of risks, benefits, alternatives, and expected convalescence, she and her husband wish to proceed with surgery.  Risks discussed included but were not limited to bleeding, pain, infection, seizure, scar, stroke, SMA syndrome, neurologic deficit, weakness, numbness, visual changes, speech difficulties, cognitive difficulties, recurrence, coma, and death.  Informed consent was obtained.  Surgeon: Vallarie Mare   Assistants: Kary Kos, MD.  Dr. Saintclair Halsted provided critical assistance with retraction of neural structures during surgery .  No qualified trainees were available to assist with the procedure.  Anesthesia: General endotracheal anesthesia  Procedure in detail:  The patient was brought to the operating room.  A timeout was performed.  General anesthesia was induced and patient was intubated by the anesthesia service.  After appropriate lines and monitors were placed, patient's head was placed in a [place] head holder and affixed to the bed.  A preoperative thin cut MRI was then reconstructed into a 3D image.  This allowed for surface match registration using the BrainLab navigation system.  Using the navigation system, a curvilinear incision was planned over the expected area of the tumor.  As patient had requested this hair sparing surgery, the hair was parted and a small  portion clipped.  The scalp was preprepped with alcohol, prepped and draped in sterile fashion.  1% lidocaine with epinephrine was gently injected in the skin.  A timeout was performed.  Preoperative antibiotics, dexamethasone and mannitol were given.  Incision was made with a 10 blade and the galea and periosteum was opened sharply.  The periosteum was dissected off the skull and self-retaining retractors were placed.  The sagittal suture was identified and corresponded to midline on the neuro navigation.  A craniotomy was planned just lateral to the sagittal sinus.  Bur holes were placed with a high-speed drill and we were used to dissect the dura from the inner table skull.  Craniotome was used to perform a craniotomy.  Meticulous epidural hemostasis was obtained and a strip of Gelfoam was laid over the parasagittal area.  The dura was then opened in C-shaped manner and flapped towards the sinus.  A large bridging vein overlying the central sulcus was identified and correlated well with the neuro navigation.  The posterior superior frontal gyrus was clearly expanded and appeared to reddish in color, clearly pathologic.  Phase reversal SSEP was then performed to confirm our suspicion of the central sulcus.  This confirmed a strong phase reversal at the central sulcus, and confirmed the abnormal gyrus was in fact the SMA in front of the motor strip.  As the tumor appeared to involve the gray matter and P, the sulcus of the involved gyrus was opened anteriorly and the precentral sulcus was also opened.  Care was taken to preserve the vessels in the sulcus, especially the precentral sulcus.  Once dissection was performed down to the base of the sulcus, bipolar electrocautery was  used to bipolar the pia and which was sharply cut with microscissors.  There was clearly a firm and grayish abnormal brain with hypervascularity within the gyrus.  An en bloc resection was performed going around the mass along the bottom  aspect with the inferior aspect correct of the resection determined by feel and inspection of the white matter.  Medially, the involved brain was separated from the pia arachnoid and resected in a subpleural manner.  Small feeders from the ACA going directly to the mass were coagulated and cut.  In this manner, the tumor was resected in en bloc fashion.  A small piece was then removed and sent to frozen section which returned as glial tissue with neoplastic elements.  Meticulous hemostasis was obtained.  The dura was then closed with 4-0 Nurolon in interrupted fashion with a DuraGen plus onlay.  Meticulous hemostasis obtained in the epidural space.  The bone flap was replaced with Stryker cranial plating system.  The wound was irrigated thoroughly with bacitracin impregnated irrigation.  The galea was closed with 2-0 Vicryl stitches in buried interrupted fashion.  The skin was closed with staples.  Bacitracin and a sterile dressing were then placed.  Patient was then removed from [place] head holder with the expectation of extubation by the anesthesia service.  All counts were correct at the end of surgery.  No complications were noted.  Findings: Glial neoplasm  Estimated Blood Loss:  75 ml         Drains: none        Specimens: left frontal brain mass         Implants: Stryker plating        Complications:  * No complications entered in OR log *         Disposition: PACU - hemodynamically stable.         Condition: stable

## 2020-02-16 NOTE — Progress Notes (Signed)
Neurosurgery postop check  Awake but drowsy, waking up appropriately FC x 4.  Less spontaneous movement on right side but greater than antigravity. 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

## 2020-02-16 NOTE — H&P (Signed)
  Chief Complaint  Brain mass  History of Present Illness  Hannah [name] is a 59 y.o. female who initially presented with a seizure and was found to have a left superior frontal gyrus infiltrating mass with a small amount of enhancement.  This was followed radiographically and after 1 month, showed progressive enhancement.  Given concern for glioma, after interdisciplinary discussion at tumor board, she was referred to me for resection of this mass.  Past Medical History   Past Medical History:  Diagnosis Date  . Cervical spondylolysis 12/15/2019   Skeleton: Mild cervical spondylosis C6-7. Incidental find.   . Left ankle sprain 10/07/2011  . Seizure ([place])    partial     Past Surgical History   Past Surgical History:  Procedure Laterality Date  . FOOT SURGERY Left    bone spur - local anesthesia  . TONSILECTOMY/ADENOIDECTOMY WITH MYRINGOTOMY     tonsils and adenoids out only  . WISDOM TOOTH EXTRACTION      Social History   Social History   Tobacco Use  . Smoking status: Former Smoker    Years: 2.00    Types: Cigarettes    Quit date: 11/05/1979    Years since quitting: 40.3  . Smokeless tobacco: Never Used  . Tobacco comment: 1/2 pack a week  Substance Use Topics  . Alcohol use: Yes    Alcohol/week: 0.0 standard drinks    Comment: 1 glass of wine weekly  . Drug use: No    Medications   Prior to Admission medications   Medication Sig Start Date End Date Taking? Authorizing Provider  levETIRAcetam (KEPPRA) 500 MG tablet Take 1 tablet (500 mg total) by mouth 2 (two) times daily. 01/20/20  Yes Vaslow, Acey Lav, MD  Multiple Vitamin (MULTIVITAMIN ADULT PO) Take 1 tablet by mouth daily.    Yes [provider]    Allergies  No Known Allergies  Review of Systems  ROS  Neurologic Exam  Awake, alert, oriented Memory and concentration grossly intact Speech fluent, appropriate CN grossly intact Motor exam: Upper Extremities Deltoid Bicep Tricep Grip    Right 5/5 5/5 5/5 5/5  Left 5/5 5/5 5/5 5/5   Lower Extremities IP Quad PF DF EHL  Right 5/5 5/5 5/5 5/5 5/5  Left 5/5 5/5 5/5 5/5 5/5   Sensation grossly intact to LT  Imaging  MRI shows a left SMA region expansile lesion with FLAIR abnormality as well as central area of enhancement. Impression  - 59 y.o. female who initially presented with seizure and was found to have a left superior frontal gyrus/SMA region infiltrating mass concerning for glioma.  I had a long discussion with the patient and husband regarding treatment options.  I recommended surgical resection both for diagnosis as well as for possible cytoreductive therapeutic benefit.  Risks, benefits, alternatives and expected convalescence were discussed with the patient and husband.  Risks discussed included but were not limited to bleeding, pain, infection, seizure, scar, stroke, SMA syndrome, weakness, numbness, language difficulty, neurologic deficit, coma, and death.  Informed consent was obtained.  Plan is for the OR today.

## 2020-02-16 NOTE — Anesthesia Preprocedure Evaluation (Addendum)
Anesthesia Evaluation  Patient identified by MRN, date of birth, ID band Patient awake    Reviewed: Allergy & Precautions, NPO status , Patient's Chart, lab work & pertinent test results  Airway Mallampati: II  TM Distance: >3 FB Neck ROM: Full    Dental  (+) Dental Advisory Given, Teeth Intact   Pulmonary former smoker,    Pulmonary exam normal breath sounds clear to auscultation       Cardiovascular negative cardio ROS Normal cardiovascular exam Rhythm:Regular Rate:Normal     Neuro/Psych Seizures -,  negative psych ROS   GI/Hepatic negative GI ROS, Neg liver ROS,   Endo/Other  negative endocrine ROS  Renal/GU negative Renal ROS     Musculoskeletal  (+) Arthritis ,   Abdominal   Peds  Hematology negative hematology ROS (+)   Anesthesia Other Findings   Reproductive/Obstetrics negative OB ROS                             Anesthesia Physical Anesthesia Plan  ASA: III  Anesthesia Plan: General   Post-op Pain Management:    Induction: Intravenous  PONV Risk Score and Plan: 4 or greater and Ondansetron, Dexamethasone, Treatment may vary due to age or medical condition, Metaclopromide and Propofol infusion  Airway Management Planned: Oral ETT  Additional Equipment: Arterial line  Intra-op Plan:   Post-operative Plan: Extubation in OR  Informed Consent: I have reviewed the patients History and Physical, chart, labs and discussed the procedure including the risks, benefits and alternatives for the proposed anesthesia with the patient or authorized representative who has indicated his/her understanding and acceptance.     Dental advisory given  Plan Discussed with: CRNA  Anesthesia Plan Comments: (2 x PIV  Remi gtt)       Anesthesia Quick Evaluation

## 2020-02-17 ENCOUNTER — Encounter: Payer: Self-pay | Admitting: *Deleted

## 2020-02-17 ENCOUNTER — Inpatient Hospital Stay (HOSPITAL_COMMUNITY): Payer: No Typology Code available for payment source

## 2020-02-17 DIAGNOSIS — R220 Localized swelling, mass and lump, head: Secondary | ICD-10-CM | POA: Diagnosis not present

## 2020-02-17 LAB — CBC WITH DIFFERENTIAL/PLATELET
Abs Immature Granulocytes: 0.03 10*3/uL (ref 0.00–0.07)
Basophils Absolute: 0 10*3/uL (ref 0.0–0.1)
Basophils Relative: 0 %
Eosinophils Absolute: 0 10*3/uL (ref 0.0–0.5)
Eosinophils Relative: 0 %
HCT: 40.6 % (ref 36.0–46.0)
Hemoglobin: 13.9 g/dL (ref 12.0–15.0)
Immature Granulocytes: 0 %
Lymphocytes Relative: 4 %
Lymphs Abs: 0.4 10*3/uL — ABNORMAL LOW (ref 0.7–4.0)
MCH: 31.4 pg (ref 26.0–34.0)
MCHC: 34.2 g/dL (ref 30.0–36.0)
MCV: 91.9 fL (ref 80.0–100.0)
Monocytes Absolute: 0.3 10*3/uL (ref 0.1–1.0)
Monocytes Relative: 3 %
Neutro Abs: 10.4 10*3/uL — ABNORMAL HIGH (ref 1.7–7.7)
Neutrophils Relative %: 93 %
Platelets: 225 10*3/uL (ref 150–400)
RBC: 4.42 MIL/uL (ref 3.87–5.11)
RDW: 11.7 % (ref 11.5–15.5)
WBC: 11.1 10*3/uL — ABNORMAL HIGH (ref 4.0–10.5)
nRBC: 0 % (ref 0.0–0.2)

## 2020-02-17 LAB — BASIC METABOLIC PANEL
Anion gap: 9 (ref 5–15)
BUN: 6 mg/dL (ref 6–20)
CO2: 24 mmol/L (ref 22–32)
Calcium: 8.8 mg/dL — ABNORMAL LOW (ref 8.9–10.3)
Chloride: 109 mmol/L (ref 98–111)
Creatinine, Ser: 0.78 mg/dL (ref 0.44–1.00)
GFR calc Af Amer: >60 mL/min (ref >60)
GFR calc non Af Amer: >60 mL/min (ref >60)
Glucose, Bld: 142 mg/dL — ABNORMAL HIGH (ref 70–99)
Potassium: 3.9 mmol/L (ref 3.5–5.1)
Sodium: 142 mmol/L (ref 135–145)

## 2020-02-17 MED ORDER — GADOBUTROL 1 MMOL/ML IV SOLN
6.0000 mL | Freq: Once | INTRAVENOUS | Status: AC | PRN
  Administered 2020-02-17: 7.5 mL via INTRAVENOUS

## 2020-02-17 MED FILL — Thrombin For Soln 20000 Unit: CUTANEOUS | Qty: 1 | Status: AC

## 2020-02-17 NOTE — Progress Notes (Signed)
Subjective: NAEs o/n  Objective: Vital signs in last 24 hours: Temp:  [97.9 F (36.6 C)-98.9 F (37.2 C)] 98.5 F (36.9 C) (04/15 1200) Pulse Rate:  [51-113] 59 (04/15 1100) Resp:  [13-29] 15 (04/15 1100) BP: (83-151)/(46-101) 93/50 (04/15 1100) SpO2:  [90 %-100 %] 92 % (04/15 1100) Arterial Line BP: (99-176)/(58-114) 128/71 (04/15 1100)  Intake/Output from previous day: 04/14 0701 - 04/15 0700 In: 3216.1 [I.V.:2827.3; IV Piggyback:338.9] Out: [id number] [Urine:3125; Blood:150] Intake/Output this shift: Total I/O In: 382.6 [I.V.:282.6; IV Piggyback:100] Out: -   Awake, alert, affect blunted. Sitting in chair. No spontaneous speech, nonverbal FC x 4 but less spontaneous movement on right.  Right pronator drift. Dressing c/d Ext wwp  Lab Results: Recent Labs    02/16/20 1154 02/16/20 1154 02/16/20 1355 02/17/20 0136  WBC 5.3  --   --  11.1*  HGB 13.5   < > 12.6 13.9  HCT 39.7   < > 37.0 40.6  PLT 212  --   --  225   < > = values in this interval not displayed.   BMET Recent Labs    02/16/20 1355 02/17/20 0136  NA 142 142  K 3.5 3.9  CL  --  109  CO2  --  24  GLUCOSE  --  142*  BUN  --  6  CREATININE  --  0.78  CALCIUM  --  8.8*    Studies/Results: MR BRAIN W WO CONTRAST  Result Date: 02/15/2020 CLINICAL DATA:  Brain tumor, glioma. EXAM: MRI HEAD WITHOUT AND WITH CONTRAST TECHNIQUE: Multiplanar, multiecho pulse sequences of the brain and surrounding structures were obtained without and with intravenous contrast. CONTRAST:  10mL GADAVIST GADOBUTROL 1 MMOL/ML IV SOLN COMPARISON:  Prior brain MRI examinations 01/18/2020 and earlier FINDINGS: Brain: Again demonstrated is a T2/FLAIR hyperintense parenchymal mass within the paramedian posterior left frontal lobe. The main component of the mass has slightly increased in size since 01/18/2020, now measuring 3.0 x 2.6 x 2.7 cm (AP x TV x CC) (previously 3.0 x 2.6 x 2.5 cm). Additionally, the associated nodular enhancing  focus has increased in size, now measuring 13 x 10 x 17 mm (AP x TV x CC) (previously 10 x 8 x 15 mm, remeasured on prior). As before, the mass demonstrates corresponding restricted diffusion. Stable mild chronic small vessel ischemic disease within the cerebral white matter. No evidence of acute infarct. No midline shift or extra-axial fluid collection. No chronic intracranial blood products. Vascular: Flow voids maintained within the proximal large arterial vessels. Skull and upper cervical spine: No focal marrow lesion. Sinuses/Orbits: Visualized orbits demonstrate no acute abnormality. Mild ethmoid sinus mucosal thickening. No significant mastoid effusion. IMPRESSION: Continued slight progressive change of a parenchymal mass within the paramedian posterior left frontal lobe as compared to MRI 01/18/2019. There has been a slight interval increase in size of the overall mass and of an associated nodular enhancing component. 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 Electronically Signed   By: Kellie Simmering DO   On: 02/15/2020 16:46    Assessment/Plan: 59 yo F s/p resection of left SMA region brain mass POD#1 - motor aphasia likely transient and part of expected SMA syndrome postop - continue dex taper - cont Keppra - MRI today to establish postop baseline - speech c/s for language, cognition, and swallowing - f/u pathology - will continue ICU for one more day  I discussed my assessment and plan of care with the husband and daughter at the bedside.  Vallarie Mare 02/17/2020, 12:05 PM

## 2020-02-17 NOTE — Evaluation (Signed)
Physical Therapy Evaluation Patient Details Name: Hannah [name] MRN: [id number] DOB: January 08, 1961 Today's Date: 02/17/2020   History of Present Illness  59 y.o. female who initially presented with a seizure and was found to have a left superior frontal gyrus infiltrating mass with a small amount of enhancement. Pt presents on 4/14 for craniotomy and resection of SMA tumor.  Clinical Impression  Pt presents to PT with deficits in functional mobility, strength, power, gait, balance, endurance, communication, cognition. Pt unsteady during gait requiring UE support to reduce balance deficits. Pt limited in mobility by headache and requires cues for safety during session. PT anticipates pt will progress well with aggressive mobilization and family support. Pt very active at baseline, daughter reports she went on a 14 mile hike Saturday prior to surgery. PT currently recommending home with outpatient PT, pt may require use of cane or RW upon discharge.    Follow Up Recommendations Outpatient PT;Supervision for mobility/OOB    Equipment Recommendations  3in1 (PT)    Recommendations for Other Services       Precautions / Restrictions Precautions Precautions: Fall;Other (comment) Precaution Comments: speech deficits Restrictions Weight Bearing Restrictions: No      Mobility  Bed Mobility Overal bed mobility: Needs Assistance Bed Mobility: Sit to Supine     Supine to sit: Min assist Sit to supine: Min guard   General bed mobility comments: Min A for trunk support into sitting  Transfers Overall transfer level: Needs assistance Equipment used: None Transfers: Sit to/from Stand Sit to Stand: Min guard Stand pivot transfers: Min assist       General transfer comment: Min A for initating power up and maintaining balance. Requesting hand held A during pivot to recliner  Ambulation/Gait Ambulation/Gait assistance: Min guard Gait Distance (Feet): 30 Feet Assistive device: None(pt  reaching for furniture or objects for UE support) Gait Pattern/deviations: Step-to pattern Gait velocity: reduced Gait velocity interpretation: <1.8 ft/sec, indicate of risk for recurrent falls General Gait Details: pt with slowed step to gait, reaching for UE support when available but denying need for assistive device use  Stairs            Wheelchair Mobility    Modified Rankin (Stroke Patients Only)       Balance Overall balance assessment: Needs assistance Sitting-balance support: No upper extremity supported;Feet supported Sitting balance-Leahy Scale: Fair Sitting balance - Comments: close supervision   Standing balance support: No upper extremity supported Standing balance-Leahy Scale: Fair Standing balance comment: minG without UE support                             Pertinent Vitals/Pain Pain Assessment: 0-10 Pain Score: 3  Faces Pain Scale: Hurts little more Pain Location: head Pain Descriptors / Indicators: Headache Pain Intervention(s): Monitored during session    Home Living Family/patient expects to be discharged to:: Private residence Living Arrangements: Spouse/significant other;Children Available Help at Discharge: Family;Available 24 hours/day Type of Home: House Home Access: Stairs to enter Entrance Stairs-Rails: None Entrance Stairs-Number of Steps: 2 Home Layout: Two level;Bed/bath upstairs Home Equipment: Shower seat;Cane - single point;Walker - 2 wheels      Prior Function Level of Independence: Independent         Comments: ADLs, IADLs, working. Driving until recently. 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 Enjoys playing tennis     Hand Dominance   Dominant Hand: Right    Extremity/Trunk Assessment   Upper Extremity Assessment  Upper Extremity Assessment: Defer to OT evaluation RUE Deficits / Details: Decreased strength, coorindation, and ROM. Able to perform elbow, wrist, and hand ROM; digit opposition  with increased time. Pt with weakness to perform forward flexion at shoulder and benefiting from [facility].  RUE Coordination: decreased fine motor;decreased gross motor    Lower Extremity Assessment Lower Extremity Assessment: Generalized weakness(RLE slightly weaker than LLE, both 4/5)    Cervical / Trunk Assessment Cervical / Trunk Assessment: Other exceptions Cervical / Trunk Exceptions: s/p crani  Communication   Communication: Expressive difficulties  Cognition Arousal/Alertness: Awake/alert Behavior During Therapy: Flat affect Overall Cognitive Status: Impaired/Different from baseline Area of Impairment: Attention;Awareness;Problem solving                   Current Attention Level: Focused       Awareness: Emergent Problem Solving: Slow processing;Difficulty sequencing;Requires verbal cues General Comments: Pt with slow processing and requiring increased time; seen during functional transfer and with using phone to text for communication      General Comments General comments (skin integrity, edema, etc.): pt SpO2 in low 90s on room air, other VSS, pt limited by headache. Pt unable to verbalize or produce any words durign session, communicating with phone    Exercises     Assessment/Plan    PT Assessment Patient needs continued PT services  PT Problem List Decreased strength;Decreased activity tolerance;Decreased balance;Decreased mobility;Decreased coordination;Decreased cognition;Decreased knowledge of use of DME;Decreased safety awareness;Decreased knowledge of precautions;Pain       PT Treatment Interventions DME instruction;Gait training;Stair training;Functional mobility training;Therapeutic activities;Therapeutic exercise;Balance training;Neuromuscular re-education;Cognitive remediation;Patient/family education    PT Goals (Current goals can be found in the Care Plan section)  Acute Rehab PT Goals Patient Stated Goal: To return to independence PT Goal  Formulation: With patient Time For Goal Achievement: 03/02/20 Potential to Achieve Goals: Good Additional Goals Additional Goal #1: Pt will maintain dynamic standing balance within 10 inches of her base of support without UE support and with supervision.    Frequency Min 4X/week   Barriers to discharge        Co-evaluation               AM-PAC PT "6 Clicks" Mobility  Outcome Measure Help needed turning from your back to your side while in a flat bed without using bedrails?: A Little Help needed moving from lying on your back to sitting on the side of a flat bed without using bedrails?: A Little Help needed moving to and from a bed to a chair (including a wheelchair)?: A Little Help needed standing up from a chair using your arms (e.g., wheelchair or bedside chair)?: A Little Help needed to walk in hospital room?: A Little Help needed climbing 3-5 steps with a railing? : A Lot 6 Click Score: 17    End of Session   Activity Tolerance: Patient limited by pain Patient left: in bed;with call bell/phone within reach;with bed alarm set;with family/visitor present Nurse Communication: Mobility status PT Visit Diagnosis: Unsteadiness on feet (R26.81);Other abnormalities of gait and mobility (R26.89);Muscle weakness (generalized) (M62.81);Pain Pain - Right/Left: Left Pain - part of body: (head)    Time: [id number] PT Time Calculation (min) (ACUTE ONLY): 39 min   Charges:   PT Evaluation $PT Eval Moderate Complexity: 1 Mod PT Treatments $Therapeutic Activity: 8-22 mins        Zenaida Niece, PT, DPT Acute Rehabilitation Pager: [phone]   Zenaida Niece 02/17/2020, 1:54 PM

## 2020-02-17 NOTE — Consult Note (Signed)
   [facility] CM Inpatient Consult   02/17/2020  Hannah [name] 05-07-1961 [id number]   [facility] Active Status:  Pending status   Patient been outreached by our [place] Management team member in the past for post hospital follow up needs and support as a member in the [place].  Our community based plan of care has focused on disease management and community resource support for transition of care follow up.     Plan:  Patient will receive a post hospital call and will be evaluated for assessments and disease process education by the assigned [place] Coordinator.   For additional questions or referrals please contact:   Natividad Brood, RN BSN [facility] Liaison  [phone] business mobile phone Toll free office [phone]  Fax number: [phone] [email or web address]

## 2020-02-17 NOTE — Progress Notes (Signed)
[facility] Progress Note  Patient Care Team: [name], DO as PCP - General (Family Medicine) [place] (Neurosurgery) Alfonzo Feller, RN as [place] Management  CHIEF COMPLAINTS/PURPOSE OF CONSULTATION:  Left Frontal Mass  INTERVAL HISTORY:  Hannah [name] 59 y.o. female did fairly well with surgery. She continues to have some difficulty with verbal expression.  No other complications, we are now awaiting path.  Family is at bedside.  MEDICAL HISTORY:  Past Medical History:  Diagnosis Date  . Cervical spondylolysis 12/15/2019   Skeleton: Mild cervical spondylosis C6-7. Incidental find.   . Left ankle sprain 10/07/2011  . Seizure ([place])    partial     SURGICAL HISTORY: Past Surgical History:  Procedure Laterality Date  . APPLICATION OF CRANIAL NAVIGATION Left 02/16/2020   Procedure: APPLICATION OF CRANIAL NAVIGATION;  Surgeon: Vallarie Mare, MD;  Location: [facility];  Service: Neurosurgery;  Laterality: Left;  . CRANIOTOMY Left 02/16/2020   Procedure: LEFT FRONTAL CRANIOTOMY FOR BRAIN TUMOR;  Surgeon: Vallarie Mare, MD;  Location: [facility];  Service: Neurosurgery;  Laterality: Left;  . FOOT SURGERY Left    bone spur - local anesthesia  . TONSILECTOMY/ADENOIDECTOMY WITH MYRINGOTOMY     tonsils and adenoids out only  . WISDOM TOOTH EXTRACTION      SOCIAL HISTORY: Social History   Socioeconomic History  . Marital status: Married    Spouse name: Not on file  . Number of children: 3  . Years of education: Not on file  . Highest education level: Not on file  Occupational History  . Not on file  Tobacco Use  . Smoking status: Former Smoker    Years: 2.00    Types: Cigarettes    Quit date: 11/05/1979    Years since quitting: 40.3  . Smokeless tobacco: Never Used  . Tobacco comment: 1/2 pack a week  Substance and Sexual Activity  . Alcohol use: Yes    Alcohol/week: 0.0  standard drinks    Comment: 1 glass of wine weekly  . Drug use: No  . Sexual activity: Yes    Birth control/protection: Surgical  Other Topics Concern  . Not on file  Social History Narrative  . Not on file   Social Determinants of Health   Financial Resource Strain:   . Difficulty of Paying Living Expenses:   Food Insecurity:   . Worried About Charity fundraiser in the Last Year:   . Arboriculturist in the Last Year:   Transportation Needs:   . Film/video editor (Medical):   Marland Kitchen Lack of Transportation (Non-Medical):   Physical Activity:   . Days of Exercise per Week:   . Minutes of Exercise per Session:   Stress:   . Feeling of Stress :   Social Connections:   . Frequency of Communication with Friends and Family:   . Frequency of Social Gatherings with Friends and Family:   . Attends Religious Services:   . Active Member of Clubs or Organizations:   . Attends Archivist Meetings:   Marland Kitchen Marital Status:   Intimate Partner Violence:   . Fear of Current or Ex-Partner:   . Emotionally Abused:   Marland Kitchen Physically Abused:   . Sexually Abused:     FAMILY HISTORY: Family History  Problem Relation Age of Onset  . Hypertension Mother   . Stroke Mother   . Diabetes Mother   .  Cancer Father 99       colon  . Hypertension Sister   . Colon polyps Sister   . Heart disease Brother 64  . Diabetes Maternal Uncle   . Diabetes Maternal Uncle     ALLERGIES:  has No Known Allergies.  MEDICATIONS:  Current Facility-Administered Medications  Medication Dose Route Frequency Provider Last Rate Last Admin  . 0.9 %  sodium chloride infusion (Manually program via Guardrails IV Fluids)   Intravenous Once Vallarie Mare, MD      . 0.9 %  sodium chloride infusion   Intravenous Continuous Vallarie Mare, MD 10 mL/hr at 02/16/20 0951 New Bag at 02/16/20 0951  . 0.9 % NaCl with KCl 20 mEq/ L  infusion   Intravenous Continuous Vallarie Mare, MD 100 mL/hr at 02/17/20 1008  New Bag at 02/17/20 1008  . acetaminophen (TYLENOL) tablet 650 mg  650 mg Oral Q4H PRN Vallarie Mare, MD   650 mg at 02/17/20 1413   Or  . acetaminophen (TYLENOL) suppository 650 mg  650 mg Rectal Q4H PRN Vallarie Mare, MD      . Chlorhexidine Gluconate Cloth 2 % PADS 6 each  6 each Topical Daily Vallarie Mare, MD   6 each at 02/16/20 2100  . dexamethasone (DECADRON) injection 6 mg  6 mg Intravenous Q6H Vallarie Mare, MD   6 mg at 02/17/20 1413   Followed by  . [START ON 02/18/2020] dexamethasone (DECADRON) injection 4 mg  4 mg Intravenous Q6H Vallarie Mare, MD       Followed by  . [START ON 02/19/2020] dexamethasone (DECADRON) injection 4 mg  4 mg Intravenous Q8H Vallarie Mare, MD      . docusate sodium (COLACE) capsule 100 mg  100 mg Oral BID Vallarie Mare, MD      . enalaprilat (VASOTEC) injection 1.25 mg  1.25 mg Intravenous Q6H PRN Vallarie Mare, MD      . enoxaparin (LOVENOX) injection 40 mg  40 mg Subcutaneous Q24H Vallarie Mare, MD   40 mg at 02/17/20 1009  . labetalol (NORMODYNE) injection 10 mg  10 mg Intravenous Q10 min PRN Vallarie Mare, MD      . levETIRAcetam (KEPPRA) IVPB 500 mg/100 mL premix  500 mg Intravenous Q12H Meyran, Ocie Cornfield, NP 400 mL/hr at 02/17/20 1009 500 mg at 02/17/20 1009  . morphine 2 MG/ML injection 1-2 mg  1-2 mg Intravenous Q2H PRN Vallarie Mare, MD   1 mg at 02/17/20 0544  . nicardipine (CARDENE) 20mg  in 0.86% saline 235ml IV infusion (0.1 mg/ml)  3-15 mg/hr Intravenous Continuous Vallarie Mare, MD 25 mL/hr at 02/16/20 1829 2.5 mg/hr at 02/16/20 1829  . ondansetron (ZOFRAN) tablet 4 mg  4 mg Oral Q4H PRN Vallarie Mare, MD       Or  . ondansetron [facility]) injection 4 mg  4 mg Intravenous Q4H PRN Vallarie Mare, MD   4 mg at 02/17/20 0544  . oxyCODONE-acetaminophen (PERCOCET/ROXICET) 5-325 MG per tablet 1-2 tablet  1-2 tablet Oral Q6H PRN Vallarie Mare, MD      . pantoprazole  (PROTONIX) injection 40 mg  40 mg Intravenous QHS Vallarie Mare, MD   40 mg at 02/16/20 2330  . polyethylene glycol (MIRALAX / GLYCOLAX) packet 17 g  17 g Oral Daily PRN Vallarie Mare, MD      . promethazine (PHENERGAN) tablet 12.5-25 mg  12.5-25 mg  Oral Q4H PRN Vallarie Mare, MD      . [facility]) tablet 8.6 mg  1 tablet Oral BID Vallarie Mare, MD      . sodium phosphate (FLEET) 7-19 GM/118ML enema 1 enema  1 enema Rectal Once PRN Vallarie Mare, MD        REVIEW OF SYSTEMS:   Limited by dyshpasia  PHYSICAL EXAMINATION: Vitals:   02/17/20 1400 02/17/20 1600  BP: (!) 99/58   Pulse: (!) 50   Resp: 19   Temp:  (!) 97.5 F (36.4 C)  SpO2: 96%    KPS: 70. General: Alert, cooperative, pleasant, in no acute distress Head: Craniotomy scar noted, dry and intact. EENT: No conjunctival injection or scleral icterus. Oral mucosa moist Lungs: Resp effort normal Cardiac: Regular rate and rhythm Abdomen: Soft, non-distended abdomen Skin: No rashes cyanosis or petechiae. Extremities: No clubbing or edema  NEUROLOGIC EXAM: Mental Status: Awake, alert, attentive to examiner. Oriented to self and environment. Language is densely impaired with regards to fluency. Cranial Nerves: Visual acuity is grossly normal. Visual fields are full. Extra-ocular movements intact. No ptosis. Face is symmetric, tongue midline. Motor: Tone and bulk are normal. Power is impaired in right arm 4/5. Reflexes are symmetric, no pathologic reflexes present. Intact finger to nose bilaterally Sensory: Intact to light touch and temperature Gait: Deferred  LABORATORY DATA:  I have reviewed the data as listed Lab Results  Component Value Date   WBC 11.1 (H) 02/17/2020   HGB 13.9 02/17/2020   HCT 40.6 02/17/2020   MCV 91.9 02/17/2020   PLT 225 02/17/2020   Recent Labs    12/15/19 2056 12/15/19 2114 12/16/19 0817 12/16/19 0817 12/21/19 1134 02/16/20 1355 02/17/20 0136  NA 141   < > 142    < > 138 142 142  K 3.1*   < > 4.1   < > 4.1 3.5 3.9  CL 104   < > 105  --  102  --  109  CO2 24   < > 22  --  29  --  24  GLUCOSE 113*   < > 151*  --  82  --  142*  BUN 12   < > 10  --  15  --  6  CREATININE 0.89   < > 0.75  --  0.81  --  0.78  CALCIUM 9.4   < > 9.8  --  9.4  --  8.8*  GFRNONAA >60  --  >60  --   --   --  >60  GFRAA >60  --  >60  --   --   --  >60  PROT 7.1  --   --   --  6.9  --   --   ALBUMIN 4.3  --   --   --  4.5  --   --   AST 24  --   --   --  16  --   --   ALT 18  --   --   --  14  --   --   ALKPHOS 60  --   --   --  55  --   --   BILITOT 0.5  --   --   --  0.9  --   --    < > = values in this interval not displayed.    RADIOGRAPHIC STUDIES: I have personally reviewed the radiological images as listed and agreed with  the findings in the report. MR BRAIN W WO CONTRAST  Result Date: 02/17/2020 CLINICAL DATA:  Brain/CNS neoplasm, assess treatment response status post craniotomy for brain mass. EXAM: MRI HEAD WITHOUT AND WITH CONTRAST TECHNIQUE: Multiplanar, multiecho pulse sequences of the brain and surrounding structures were obtained without and with intravenous contrast. CONTRAST:  7.60mL GADAVIST GADOBUTROL 1 MMOL/ML IV SOLN COMPARISON:  Brain MRI 02/15/2020 FINDINGS: Brain: Postoperative changes from interval left parietal craniotomy for resection of a posterior paramedian left frontal lobe mass. There is a small amount of left frontal pneumocephalus. There is a small amount of expected postoperative blood products within the resection cavity and expected mild curvilinear enhancement along the resection cavity margins. There is mild parenchymal T2/FLAIR hyperintensity surrounding the resection cavity. This may reflect postoperative edema. A small amount of residual tumor cannot be excluded, particularly along the left anterolateral aspect of the resection cavity (series 6, image 23). There is no evidence of acute infarct. No midline shift. There is a trace  extra-axial fluid collection deep to the cranioplasty. Stable, mild chronic small vessel ischemic disease. Vascular: Flow voids maintained within the proximal large arterial vessels. Skull and upper cervical spine: Left parietal craniotomy no focal marrow lesion. Sinuses/Orbits: Visualized orbits demonstrate no acute abnormality. Trace ethmoid sinus mucosal thickening. No significant mastoid effusion. IMPRESSION: Postoperative changes from interval resection of a posterior paramedian left frontal lobe mass. There is a small amount of parenchyma T2 hyperintensity surrounding the resection cavity which may reflect postoperative edema. A small amount of residual tumor cannot be excluded, particularly along the left anterolateral aspect of the resection cavity. Attention is recommended on follow-up. Electronically Signed   By: Kellie Simmering DO   On: 02/17/2020 14:18   MR BRAIN W WO CONTRAST  Result Date: 02/15/2020 CLINICAL DATA:  Brain tumor, glioma. EXAM: MRI HEAD WITHOUT AND WITH CONTRAST TECHNIQUE: Multiplanar, multiecho pulse sequences of the brain and surrounding structures were obtained without and with intravenous contrast. CONTRAST:  36mL GADAVIST GADOBUTROL 1 MMOL/ML IV SOLN COMPARISON:  Prior brain MRI examinations 01/18/2020 and earlier FINDINGS: Brain: Again demonstrated is a T2/FLAIR hyperintense parenchymal mass within the paramedian posterior left frontal lobe. The main component of the mass has slightly increased in size since 01/18/2020, now measuring 3.0 x 2.6 x 2.7 cm (AP x TV x CC) (previously 3.0 x 2.6 x 2.5 cm). Additionally, the associated nodular enhancing focus has increased in size, now measuring 13 x 10 x 17 mm (AP x TV x CC) (previously 10 x 8 x 15 mm, remeasured on prior). As before, the mass demonstrates corresponding restricted diffusion. Stable mild chronic small vessel ischemic disease within the cerebral white matter. No evidence of acute infarct. No midline shift or extra-axial  fluid collection. No chronic intracranial blood products. Vascular: Flow voids maintained within the proximal large arterial vessels. Skull and upper cervical spine: No focal marrow lesion. Sinuses/Orbits: Visualized orbits demonstrate no acute abnormality. Mild ethmoid sinus mucosal thickening. No significant mastoid effusion. IMPRESSION: Continued slight progressive change of a parenchymal mass within the paramedian posterior left frontal lobe as compared to MRI 01/18/2019. There has been a slight interval increase in size of the overall mass and of an associated nodular enhancing component. 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 Electronically Signed   By: Kellie Simmering DO   On: 02/15/2020 16:46    ASSESSMENT & PLAN:  Left Frontal Mass  Arrie A Lindenbaum continues to recover from tumor resection.  Language and motor deficits are highly likely  to improve/resolve with time based on location.  Will follow up in clinic in ~2 weeks for discussion of path.   Case will be additionally discussed in brain/spine tumor board.  All questions were answered. The patient knows to call the clinic with any problems, questions or concerns.  The total time spent in the encounter was 30 minutes and more than 50% was on counseling and review of test results     [name], MD 02/17/2020 4:44 PM

## 2020-02-17 NOTE — Evaluation (Addendum)
Clinical/Bedside Swallow Evaluation Patient Details  Name: Hannah [name] MRN: [id number] Date of Birth: 1961-03-01  Today's Date: 02/17/2020 Time: SLP Start Time (ACUTE ONLY): 1200 SLP Stop Time (ACUTE ONLY): 1230 SLP Time Calculation (min) (ACUTE ONLY): 30 min  Past Medical History:  Past Medical History:  Diagnosis Date  . Cervical spondylolysis 12/15/2019   Skeleton: Mild cervical spondylosis C6-7. Incidental find.   . Left ankle sprain 10/07/2011  . Seizure ([place])    partial    Past Surgical History:  Past Surgical History:  Procedure Laterality Date  . FOOT SURGERY Left    bone spur - local anesthesia  . TONSILECTOMY/ADENOIDECTOMY WITH MYRINGOTOMY     tonsils and adenoids out only  . WISDOM TOOTH EXTRACTION     HPI:  Hannah [name] is a 59 y.o. female who initially presented with a seizure and was found to have a left superior frontal gyrus infiltrating mass with a small amount of enhancement.  This was followed radiographically and after 1 month, showed progressive enhancement.  Given concern for glioma, after interdisciplinary discussion at tumor board, she was referred for resection of this mass. Underwent craniotomy on 4/14. 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 Pt demonstrates slightly decreased volitional control over oral movements, but with extra time pt able to follow all oral motor commands without weakness. Also able to brush her teeth with appropriate oral movement. When attempting to swish and spit, pt could not plan a spit and swallowed water without difficulty. She then transitioned into normal straw sips, self feeding applesauce  with a spoon, masticating cheerios and eating an icee. No oral neuromuscular weakness or dysphagia appreciated. No signs of aspiration. Pt can continue to consume a regular diet with thin liquids. See next note for cognitive linguistic assessment.  SLP Visit Diagnosis: Dysphagia, unspecified (R13.10)    Aspiration Risk  Mild aspiration risk    Diet Recommendation Regular;Thin liquid   Liquid Administration via: Straw;Cup Medication Administration: Whole meds with liquid Supervision: Patient able to self feed Compensations: Slow rate;Small sips/bites Postural Changes: Seated upright at 90 degrees    Other  Recommendations Oral Care Recommendations: Oral care BID   Follow up Recommendations None      Frequency and Duration            Prognosis        Swallow Study   General HPI: Hannah [name] is a 59 y.o. female who initially presented with a seizure and was found to have a left superior frontal gyrus infiltrating mass with a small amount of enhancement.  This was followed radiographically and after 1 month, showed progressive enhancement.  Given concern for glioma, after interdisciplinary discussion at tumor board, she was referred for resection of this mass. Underwent craniotomy on 4/14. 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 Type of Study: Bedside Swallow Evaluation Previous Swallow Assessment: none Diet Prior to this Study: NPO Temperature Spikes Noted: No History of Recent Intubation: No Behavior/Cognition: Alert;Cooperative Oral Cavity Assessment: Within Functional Limits Oral Care Completed by SLP: No Oral Cavity - Dentition: Adequate natural dentition Self-Feeding Abilities: Able to feed self Patient Positioning: Upright in  bed Baseline Vocal Quality: Other (comment)(cannot initiate phonation) Volitional Cough: Cognitively unable to elicit Volitional Swallow: Unable to elicit    Oral/Motor/Sensory Function Overall  Oral Motor/Sensory Function: Within functional limits   Ice Chips Ice chips: Not tested   Thin Liquid Thin Liquid: Within functional limits Presentation: Straw;Self Fed    Nectar Thick Nectar Thick Liquid: Not tested   Honey Thick Honey Thick Liquid: Not tested   Puree Puree: Within functional limits Presentation: Spoon   Solid     Solid: Within functional limits     Herbie [place], MA [place] Pager [phone] Office [phone]  Lynann Beaver 02/17/2020,1:52 PM

## 2020-02-17 NOTE — Anesthesia Postprocedure Evaluation (Signed)
Anesthesia Post Note  Patient: Hannah [name]  Procedure(s) Performed: LEFT FRONTAL CRANIOTOMY FOR BRAIN TUMOR (Left ) APPLICATION OF CRANIAL NAVIGATION (Left )     Patient location during evaluation: PACU Anesthesia Type: General Level of consciousness: sedated and responds to stimulation Pain management: pain level controlled Vital Signs Assessment: post-procedure vital signs reviewed and stable Respiratory status: spontaneous breathing Cardiovascular status: stable Anesthetic complications: no    Last Vitals:  Vitals:   02/17/20 0715 02/17/20 0800  BP:  (!) 89/53  Pulse: (!) 56 (!) 51  Resp: 16 15  Temp:  37.1 C  SpO2: 95% 94%    Last Pain:  Vitals:   02/17/20 0800  TempSrc: Oral  PainSc:                  [name]

## 2020-02-17 NOTE — Evaluation (Addendum)
Speech Language Pathology Evaluation Patient Details Name: Hannah [name] MRN: [id number] DOB: [birth year] Today's Date: 02/17/2020 Time: 1200-1230 SLP Time Calculation (min) (ACUTE ONLY): 30 min  Problem List:  Patient Active Problem List   Diagnosis Date Noted  . Status post craniotomy 02/16/2020  . Tick bite 02/08/2020  . History of recent hospitalization 12/21/2019  . Brain tumor ([place]) 12/21/2019  . Seizure disorder ([place]) 12/21/2019  . Hypokalemia 12/21/2019  . Long-term use of high-risk medication 12/21/2019  . Frontal mass of brain 12/16/2019  . Family history of colon cancer - father 25 05/26/2015  . Colon cancer screening 03/10/2015  . Vitamin D deficiency 03/10/2015   Past Medical History:  Past Medical History:  Diagnosis Date  . Cervical spondylolysis 12/15/2019   Skeleton: Mild cervical spondylosis C6-7. Incidental find.   . Left ankle sprain 10/07/2011  . Seizure ([place])    partial    Past Surgical History:  Past Surgical History:  Procedure Laterality Date  . FOOT SURGERY Left    bone spur - local anesthesia  . TONSILECTOMY/ADENOIDECTOMY WITH MYRINGOTOMY     tonsils and adenoids out only  . WISDOM TOOTH EXTRACTION     HPI:  Hannah [name] is a 59 y.o. female who initially presented with a seizure and was found to have a left superior frontal gyrus infiltrating mass with a small amount of enhancement.  This was followed radiographically and after 1 month, showed progressive enhancement.  Given concern for glioma, after interdisciplinary discussion at tumor board, she was referred for resection of this mass. Underwent craniotomy on 4/14. 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 Complex motor  tasks requiring alternating/fine motor movement require extra time. Pt is able to follow oral motor commands without weakness, but cannot initate phonation or oral movement for speech tasks (language generation or repetition with max cueing). Attempted several interventions to access contralateral engagement of motor speech/language (singing, counting, Melodic intonation techniques, writing and tandem speech) with slight phonation events and brief shaping of initial sounds. Pt likely to initiate speech in spontaneous automatic opportunities. Demonstrated and explained helpful methods to family present to attempt regularly. Will follow closely for interventions to target motor speech initiation. Pt may benefit from inpatient rehabilitation.     SLP Assessment  SLP Recommendation/Assessment: Patient needs continued Speech Lanaguage Pathology Services SLP Visit Diagnosis: Apraxia (R48.2)    Follow Up Recommendations  Inpatient Rehab    Frequency and Duration min 3x week  2 weeks      SLP Evaluation Cognition  Overall Cognitive Status: Within Functional Limits for tasks assessed Arousal/Alertness: Awake/alert Orientation Level: Oriented X4 Attention: Alternating Alternating Attention: Appears intact Memory: Appears intact Awareness: Appears intact Problem Solving: Appears intact Safety/Judgment: Appears intact       Comprehension  Auditory Comprehension Overall Auditory Comprehension: Appears within functional limits for tasks assessed Reading Comprehension Reading Status: Within funtional limits    Expression Expression Primary Mode of Expression: Nonverbal - written Verbal Expression Overall Verbal Expression: Impaired Initiation: Impaired Automatic Speech: (unable) Written Expression Dominant Hand: Right Written Expression: (left handed writing, but accurate)   Oral / Motor  Oral Motor/Sensory Function Overall Oral Motor/Sensory Function: Within functional limits Motor  Speech Overall Motor Speech: Impaired   GO  Hannah [name], Hannah [name] 02/17/2020, 2:10 PM

## 2020-02-17 NOTE — Evaluation (Addendum)
Occupational Therapy Evaluation Patient Details Name: Hannah [name] MRN: [id number] DOB: 05-Mar-1961 Today's Date: 02/17/2020    History of Present Illness 59 yo F presenting with seizures. 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 Presenting with motor aphasia expected SMA syndrome postop. PMH including seizure (2012) and cervical spondylolysis.     Clinical Impression   PTA, pt was living with her husband and was independent and working; enjoys Hydrologist. Pt currently requiring Min A for ADLs and functional transfers. Pt presenting with motor aphasia, decreased coordination and strength at RUE, poor balance, and limited activity tolerance. Encouraging pt to incorporate RUE into ADLs; pt using phone to type messages for communication with increased time. Pt also presenting with decreased processing and requiring increased time for answering questions and following commands. Pt would benefit from further acute OT to facilitate safe dc. Recommend dc to home with HHOT for further OT to optimize safety, independence with ADLs, and return to PLOF.     Follow Up Recommendations  Home health OT;Supervision/Assistance - 24 hour ; may progress to neuro OP OT   Equipment Recommendations  3 in 1 bedside commode    Recommendations for Other Services PT consult     Precautions / Restrictions Precautions Precautions: Fall;Other (comment) Precaution Comments: Speech deficits. Dizziness with stable BP Restrictions Weight Bearing Restrictions: No      Mobility Bed Mobility Overal bed mobility: Needs Assistance Bed Mobility: Supine to Sit     Supine to sit: Min assist     General bed mobility comments: Min A for trunk support into sitting  Transfers Overall transfer level: Needs assistance Equipment used: None Transfers: Sit to/from [facility] Sit to Stand: Min assist Stand pivot transfers: Min assist       General transfer comment: Min A for  initating power up and maintaining balance. Requesting hand held A during pivot to recliner    Balance Overall balance assessment: Needs assistance Sitting-balance support: No upper extremity supported;Feet supported Sitting balance-Leahy Scale: Fair     Standing balance support: No upper extremity supported;During functional activity Standing balance-Leahy Scale: Fair                             ADL either performed or assessed with clinical judgement   ADL Overall ADL's : Needs assistance/impaired Eating/Feeding: Set up;Supervision/ safety;Sitting Eating/Feeding Details (indicate cue type and reason): Pt using RUE to hold fork and cut pancake. Reporting however, that she is having difficulty swallowing. ' Grooming: Minimal assistance;Sitting   Upper Body Bathing: Minimal assistance;Sitting   Lower Body Bathing: Minimal assistance;Sit to/from stand   Upper Body Dressing : Minimal assistance;Sitting   Lower Body Dressing: Minimal assistance;Sit to/from stand   Toilet Transfer: Minimal assistance;Stand-pivot(simulated to recliner) Armed forces technical officer Details (indicate cue type and reason): Min A for single hand held A and balance         Functional mobility during ADLs: Minimal assistance(stand pivot only) General ADL Comments: Pt limited by lightheadedness (BP stable) and performing stand pivot to recliner. Agreeable to walk with PT later. Pt with limited ROM, strength, and coorindation at RUE impacting her functional performance     Vision Baseline Vision/History: Wears glasses Wears Glasses: At all times Patient Visual Report: No change from baseline       Perception     Praxis      Pertinent Vitals/Pain Pain Assessment: Faces Faces Pain Scale: Hurts little more Pain Location: Head  Pain Descriptors / Indicators: Constant;Discomfort Pain Intervention(s): Monitored during session;Limited activity within patient's tolerance;Repositioned     Hand  Dominance Right   Extremity/Trunk Assessment Upper Extremity Assessment Upper Extremity Assessment: RUE deficits/detail RUE Deficits / Details: Decreased strength, coorindation, and ROM. Able to perform elbow, wrist, and hand ROM; digit opposition with increased time. Pt with weakness to perform forward flexion at shoulder and benefiting from [facility].  RUE Coordination: decreased fine motor;decreased gross motor   Lower Extremity Assessment Lower Extremity Assessment: Defer to PT evaluation   Cervical / Trunk Assessment Cervical / Trunk Assessment: Other exceptions Cervical / Trunk Exceptions: s/p crani   Communication Communication Communication: Expressive difficulties   Cognition Arousal/Alertness: Awake/alert Behavior During Therapy: Flat affect Overall Cognitive Status: Impaired/Different from baseline Area of Impairment: Attention;Awareness;Problem solving                   Current Attention Level: Selective       Awareness: Emergent Problem Solving: Slow processing;Difficulty sequencing;Requires verbal cues General Comments: Pt with slow processing and requiring increased time; seen during functional transfer and with using phone to text for communication   General Comments  SpO2 90s on RA. reporting light headedness; BP stable.    Exercises     Shoulder Instructions      Home Living Family/patient expects to be discharged to:: Private residence Living Arrangements: Spouse/significant other;Children Available Help at Discharge: Family;Available 24 hours/day Type of Home: House Home Access: Stairs to enter [employer] of Steps: 2 Entrance Stairs-Rails: None Home Layout: Two level;Bed/bath upstairs Alternate Level Stairs-Number of Steps: ~10 Alternate Level Stairs-Rails: Left Bathroom Shower/Tub: Occupational psychologist: Standard     Home Equipment: Shower seat          Prior Functioning/Environment Level of Independence:  Independent        Comments: ADLs, IADLs, working. Driving until recently. 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 Enjoys playing tennis        OT Problem List: Decreased strength;Decreased range of motion;Decreased activity tolerance;Impaired balance (sitting and/or standing);Decreased knowledge of use of DME or AE;Decreased knowledge of precautions;Pain      OT Treatment/Interventions: Self-care/ADL training;Therapeutic exercise;Energy conservation;DME and/or AE instruction;Therapeutic activities;Patient/family education    OT Goals(Current goals can be found in the care plan section) Acute Rehab OT Goals Patient Stated Goal: Agreeable to therapy OT Goal Formulation: With patient Time For Goal Achievement: 03/02/20 Potential to Achieve Goals: Good  OT Frequency: Min 2X/week   Barriers to D/C:            Co-evaluation              AM-PAC OT "6 Clicks" Daily Activity     Outcome Measure Help from another person eating meals?: A Little Help from another person taking care of personal grooming?: A Little Help from another person toileting, which includes using toliet, bedpan, or urinal?: A Little Help from another person bathing (including washing, rinsing, drying)?: A Little Help from another person to put on and taking off regular upper body clothing?: A Little Help from another person to put on and taking off regular lower body clothing?: A Little 6 Click Score: 18   End of Session Nurse Communication: Mobility status  Activity Tolerance: Patient tolerated treatment well Patient left: in chair;with call bell/phone within reach;with chair alarm set  OT Visit Diagnosis: Unsteadiness on feet (R26.81);Other abnormalities of gait and mobility (R26.89);Muscle weakness (generalized) (M62.81);Pain Pain - part of body: (Head)  Time: 0930-1002 OT Time Calculation (min): 32 min Charges:  OT General Charges $OT Visit: 1 Visit OT Evaluation $OT  Eval Moderate Complexity: 1 Mod OT Treatments $Self Care/Home Management : 8-22 mins  Sanav Remer MSOT, OTR/L Acute Rehab Pager: [phone] Office: [place] 02/17/2020, 12:50 PM

## 2020-02-18 ENCOUNTER — Encounter (HOSPITAL_COMMUNITY): Payer: Self-pay | Admitting: Neurosurgery

## 2020-02-18 DIAGNOSIS — R001 Bradycardia, unspecified: Secondary | ICD-10-CM

## 2020-02-18 DIAGNOSIS — D72829 Elevated white blood cell count, unspecified: Secondary | ICD-10-CM

## 2020-02-18 DIAGNOSIS — R739 Hyperglycemia, unspecified: Secondary | ICD-10-CM | POA: Diagnosis not present

## 2020-02-18 DIAGNOSIS — C719 Malignant neoplasm of brain, unspecified: Secondary | ICD-10-CM

## 2020-02-18 DIAGNOSIS — Z9889 Other specified postprocedural states: Secondary | ICD-10-CM

## 2020-02-18 DIAGNOSIS — T380X5A Adverse effect of glucocorticoids and synthetic analogues, initial encounter: Secondary | ICD-10-CM

## 2020-02-18 DIAGNOSIS — R569 Unspecified convulsions: Secondary | ICD-10-CM | POA: Diagnosis not present

## 2020-02-18 DIAGNOSIS — D72828 Other elevated white blood cell count: Secondary | ICD-10-CM

## 2020-02-18 LAB — SURGICAL PATHOLOGY

## 2020-02-18 MED ORDER — DEXAMETHASONE 4 MG PO TABS
4.0000 mg | ORAL_TABLET | Freq: Three times a day (TID) | ORAL | Status: DC
  Administered 2020-02-20 (×2): 4 mg via ORAL
  Filled 2020-02-18 (×2): qty 1

## 2020-02-18 MED ORDER — DEXAMETHASONE 4 MG PO TABS: 2.0000 mg | ORAL_TABLET | Freq: Two times a day (BID) | ORAL | Status: DC

## 2020-02-18 MED ORDER — LEVETIRACETAM 500 MG PO TABS
500.0000 mg | ORAL_TABLET | Freq: Two times a day (BID) | ORAL | Status: DC
  Administered 2020-02-18 – 2020-02-20 (×5): 500 mg via ORAL
  Filled 2020-02-18 (×5): qty 1

## 2020-02-18 MED ORDER — PANTOPRAZOLE SODIUM 40 MG PO TBEC
40.0000 mg | DELAYED_RELEASE_TABLET | Freq: Every day | ORAL | Status: DC
  Administered 2020-02-18 – 2020-02-20 (×3): 40 mg via ORAL
  Filled 2020-02-18 (×3): qty 1

## 2020-02-18 MED ORDER — DEXAMETHASONE 4 MG PO TABS
4.0000 mg | ORAL_TABLET | Freq: Four times a day (QID) | ORAL | Status: AC
  Administered 2020-02-18 – 2020-02-20 (×8): 4 mg via ORAL
  Filled 2020-02-18 (×8): qty 1

## 2020-02-18 MED ORDER — DEXAMETHASONE 4 MG PO TABS: 2.0000 mg | ORAL_TABLET | Freq: Three times a day (TID) | ORAL | Status: DC

## 2020-02-18 MED ORDER — DEXAMETHASONE 0.5 MG PO TABS: 1.0000 mg | ORAL_TABLET | Freq: Two times a day (BID) | ORAL | Status: DC

## 2020-02-18 MED ORDER — DEXAMETHASONE 0.5 MG PO TABS: 1.0000 mg | ORAL_TABLET | Freq: Every day | ORAL | Status: DC

## 2020-02-18 NOTE — Progress Notes (Addendum)
Occupational Therapy Treatment Patient Details Name: Hannah [name] MRN: [id number] DOB: Sep 20, 1961 Today's Date: 02/18/2020    History of present illness 59 y.o. female who initially presented with a seizure and was found to have a left superior frontal gyrus infiltrating mass with a small amount of enhancement. Pt presents on 4/14 for craniotomy and resection of SMA tumor.   OT comments  Pt progressing towards established OT goals. Pt participate in card game with her daughter challenging her R hand FM skills, pincer strength, and executive functioning. Pt requiring Mod cues for problem solving and executive functioning. Mod cues to use RUE instead of LUE during care game.  Pt performing functional mobility in hallway with Supervision. Providing pt with handout on FM exercises for carry over to home. Update dc recommendation to home with follow up at OP OT to continue addressing cognition and functional use of RUE. Will continue to follow acutely as admitted.     Follow Up Recommendations  Outpatient OT;Supervision/Assistance - 24 hour    Equipment Recommendations  None recommended by OT    Recommendations for Other Services PT consult    Precautions / Restrictions Precautions Precautions: Fall;Other (comment) Precaution Comments: speech deficits Restrictions Weight Bearing Restrictions: No       Mobility Bed Mobility               General bed mobility comments: sitting in recliner upon arrival  Transfers Overall transfer level: Needs assistance Equipment used: None Transfers: Sit to/from Stand Sit to Stand: Supervision              Balance Overall balance assessment: Needs assistance Sitting-balance support: No upper extremity supported;Feet supported Sitting balance-Leahy Scale: Good     Standing balance support: No upper extremity supported;During functional activity Standing balance-Leahy Scale: Good Standing balance comment: supervision                           ADL either performed or assessed with clinical judgement   ADL Overall ADL's : Needs assistance/impaired                         Toilet Transfer: Supervision/safety;Ambulation(simulated to recliner)         Tub/Shower Transfer Details (indicate cue type and reason): Recommending pt use shower seat and HH shower head for bathing and prevent sx site from egtting wet. Pt and famiyl verbalize understanding.  Functional mobility during ADLs: Supervision/safety(mobility in hallway) General ADL Comments: Pt performing card game with daughter challenging her in-hand manipulation, pincher strength, cognition, problem solving, executive functioning, and memory.     Vision Baseline Vision/History: Wears glasses Wears Glasses: At all times Patient Visual Report: No change from baseline     Perception     Praxis      Cognition Arousal/Alertness: Awake/alert Behavior During Therapy: WFL for tasks assessed/performed Overall Cognitive Status: Impaired/Different from baseline Area of Impairment: Problem solving;Memory                     Memory: Decreased short-term memory       Problem Solving: Difficulty sequencing;Requires verbal cues;Slow processing General Comments: During card game, requiring pt to keep score and perform simple math. Pt requiring Mod cues for problem solving, increased time, and external aides.         Exercises Exercises: Other exercises Other Exercises Other Exercises: Provided FM handout and reviewed.   Shoulder Instructions  General Comments Daughter present throughout and husband arrving at end of session    Pertinent Vitals/ Pain       Pain Assessment: No/denies pain  Home Living Family/patient expects to be discharged to:: Private residence Living Arrangements: Spouse/significant other;Children Available Help at Discharge: Family;Available 24 hours/day Type of Home: House Home Access: Stairs to  enter [employer] of Steps: 2 Entrance Stairs-Rails: None Home Layout: Two level;Bed/bath upstairs Alternate Level Stairs-Number of Steps: ~10 Alternate Level Stairs-Rails: Right(need clarification on rails, reports L to OT) Bathroom Shower/Tub: Occupational psychologist: Standard     Home Equipment: Shower seat;Cane - single point;Walker - 2 wheels      Lives With: Spouse    Prior Functioning/Environment Level of Independence: Independent        Comments: ADLs, IADLs, working. Driving until recently. 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 Enjoys playing tennis   Frequency  Min 2X/week        Progress Toward Goals  OT Goals(current goals can now be found in the care plan section)  Progress towards OT goals: Progressing toward goals  Acute Rehab OT Goals Patient Stated Goal: To return to independence OT Goal Formulation: With patient Time For Goal Achievement: 03/02/20 Potential to Achieve Goals: Good ADL Goals Pt Will Perform Grooming: with set-up;with supervision;standing Pt Will Perform Upper Body Dressing: with set-up;with supervision;sitting Pt Will Perform Lower Body Dressing: with set-up;with supervision;sit to/from stand Pt Will Transfer to Toilet: with set-up;with supervision;ambulating;regular height toilet Pt Will Perform Toileting - Clothing Manipulation and hygiene: with set-up;with supervision;sit to/from stand Additional ADL Goal #1: Pt will demonstrate selective attention during ADLs with 1-2 cues  Plan Discharge plan needs to be updated    Co-evaluation                 AM-PAC OT "6 Clicks" Daily Activity     Outcome Measure   Help from another person eating meals?: A Little Help from another person taking care of personal grooming?: A Little Help from another person toileting, which includes using toliet, bedpan, or urinal?: A Little Help from another person bathing (including washing, rinsing, drying)?: A  Little Help from another person to put on and taking off regular upper body clothing?: A Little Help from another person to put on and taking off regular lower body clothing?: A Little 6 Click Score: 18    End of Session    OT Visit Diagnosis: Unsteadiness on feet (R26.81);Other abnormalities of gait and mobility (R26.89);Muscle weakness (generalized) (M62.81);Pain Pain - part of body: (Head)   Activity Tolerance Patient tolerated treatment well   Patient Left in chair;with call bell/phone within reach;with chair alarm set;with family/visitor present   Nurse Communication Mobility status        Time: 1557-1640 OT Time Calculation (min): 43 min  Charges: OT General Charges $OT Visit: 1 Visit OT Treatments $Self Care/Home Management : 8-22 mins $Therapeutic Activity: 23-37 mins  [place], OTR/L Acute Rehab Pager: [phone] Office: [place] 02/18/2020, 5:31 PM

## 2020-02-18 NOTE — Progress Notes (Addendum)
Subjective: NAEs o/n  Objective: Vital signs in last 24 hours: Temp:  [97.5 F (36.4 C)-99.1 F (37.3 C)] 99 F (37.2 C) (04/16 0800) Pulse Rate:  [43-65] 44 (04/16 0700) Resp:  [15-21] 16 (04/16 0700) BP: (74-123)/(35-76) 89/60 (04/16 0700) SpO2:  [91 %-96 %] 93 % (04/16 0700) Arterial Line BP: (99-143)/(60-79) 128/71 (04/15 1100)  Intake/Output from previous day: 04/15 0701 - 04/16 0700 In: 2272.1 [I.V.:2022.1; IV Piggyback:250] Out: -  Intake/Output this shift: No intake/output data recorded.  More expressive today, some pantomime Dense motor aphasia Right pronator drift, decreased coordination right hand Incision c/d  Lab Results: Recent Labs    02/16/20 1154 02/16/20 1154 02/16/20 1355 02/17/20 0136  WBC 5.3  --   --  11.1*  HGB 13.5   < > 12.6 13.9  HCT 39.7   < > 37.0 40.6  PLT 212  --   --  225   < > = values in this interval not displayed.   BMET Recent Labs    02/16/20 1355 02/17/20 0136  NA 142 142  K 3.5 3.9  CL  --  109  CO2  --  24  GLUCOSE  --  142*  BUN  --  6  CREATININE  --  0.78  CALCIUM  --  8.8*    Studies/Results: MR BRAIN W WO CONTRAST  Result Date: 02/17/2020 CLINICAL DATA:  Brain/CNS neoplasm, assess treatment response status post craniotomy for brain mass. EXAM: MRI HEAD WITHOUT AND WITH CONTRAST TECHNIQUE: Multiplanar, multiecho pulse sequences of the brain and surrounding structures were obtained without and with intravenous contrast. CONTRAST:  7.66mL GADAVIST GADOBUTROL 1 MMOL/ML IV SOLN COMPARISON:  Brain MRI 02/15/2020 FINDINGS: Brain: Postoperative changes from interval left parietal craniotomy for resection of a posterior paramedian left frontal lobe mass. There is a small amount of left frontal pneumocephalus. There is a small amount of expected postoperative blood products within the resection cavity and expected mild curvilinear enhancement along the resection cavity margins. There is mild parenchymal T2/FLAIR  hyperintensity surrounding the resection cavity. This may reflect postoperative edema. A small amount of residual tumor cannot be excluded, particularly along the left anterolateral aspect of the resection cavity (series 6, image 23). There is no evidence of acute infarct. No midline shift. There is a trace extra-axial fluid collection deep to the cranioplasty. Stable, mild chronic small vessel ischemic disease. Vascular: Flow voids maintained within the proximal large arterial vessels. Skull and upper cervical spine: Left parietal craniotomy no focal marrow lesion. Sinuses/Orbits: Visualized orbits demonstrate no acute abnormality. Trace ethmoid sinus mucosal thickening. No significant mastoid effusion. IMPRESSION: Postoperative changes from interval resection of a posterior paramedian left frontal lobe mass. There is a small amount of parenchyma T2 hyperintensity surrounding the resection cavity which may reflect postoperative edema. A small amount of residual tumor cannot be excluded, particularly along the left anterolateral aspect of the resection cavity. Attention is recommended on follow-up. 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

## 2020-02-18 NOTE — Consult Note (Signed)
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 Marcello Moores   HPI: Hannah [name] is a 59 y.o. female with history of new onset seizure and found to have left superior frontal gyrus infiltrating mass.  History taken from chart review and husband due to aphonia.  Patient lives with husband and adult daughter, who can provide assistance at discharge.  Follow up CT showed progressing mass.  She was admitted on 02/16/2020 for  Craniotomy with resection of right frontal brain mass by Dr. Marcello Moores.  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 Mickeal Skinner in 2 weeks.  On regular diet and therapy evaluations completed. MD recommending CIR due to functional decline.   Review of Systems  Unable to perform ROS: Patient nonverbal   Past Medical History:  Diagnosis Date  . Cervical spondylolysis 12/15/2019   Skeleton: Mild cervical spondylosis C6-7. Incidental find.   . Left ankle sprain 10/07/2011  . Seizure ([facility])    partial   . Tick bite 02/2020    Past Surgical History:  Procedure Laterality Date  . APPLICATION OF CRANIAL NAVIGATION Left 02/16/2020   Procedure: APPLICATION OF CRANIAL NAVIGATION;  Surgeon: Vallarie Mare, MD;  Location: [facility];  Service: Neurosurgery;  Laterality: Left;  . CRANIOTOMY Left 02/16/2020   Procedure: LEFT FRONTAL CRANIOTOMY FOR BRAIN TUMOR;  Surgeon: Vallarie Mare, MD;  Location: [facility];  Service: Neurosurgery;  Laterality: Left;  . FOOT SURGERY Left    bone spur - local anesthesia  . TONSILECTOMY/ADENOIDECTOMY WITH MYRINGOTOMY     tonsils and adenoids out only  . WISDOM TOOTH EXTRACTION      Family History  Problem Relation Age of Onset  . Hypertension Mother   . Stroke Mother   . Diabetes Mother   . Cancer Father 29       colon  .  Hypertension Sister   . Colon polyps Sister   . Heart disease Brother 35  . Diabetes Maternal Uncle   . Diabetes Maternal Uncle     Social History:  Was working till recent diagnosis. She reports that she quit smoking about 40 years ago. Her smoking use included cigarettes. She quit after 2.00 years of use. She has never used smokeless tobacco. She reports current alcohol use. She reports that she does not use drugs.    Allergies: No Known Allergies   Medications Prior to Admission  Medication Sig Dispense Refill  . levETIRAcetam (KEPPRA) 500 MG tablet Take 1 tablet (500 mg total) by mouth 2 (two) times daily. 60 tablet 3  . Multiple Vitamin (MULTIVITAMIN ADULT PO) Take 1 tablet by mouth daily.       Home: Home Living Family/patient expects to be discharged to:: Private residence Living Arrangements: Spouse/significant other, Children Available Help at Discharge: Family, Available 24 hours/day Type of Home: House Home Access: Stairs to enter [employer] of Steps: 2 Entrance Stairs-Rails: None Home Layout: Two level, Bed/bath upstairs Alternate Level Stairs-Number of Steps: ~10 Alternate Level Stairs-Rails: Right(need clarification on rails, reports L to OT) Bathroom Shower/Tub: Multimedia programmer: Standard Home Equipment: Civil engineer, contracting, Radio producer - single point, Environmental consultant - 2 wheels  Lives With: Spouse  Functional History: Prior Function Level of Independence: Independent Comments: ADLs, IADLs, working. Driving until recently. 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 Enjoys  playing tennis Functional Status:  Mobility: Bed Mobility Overal bed mobility: Needs Assistance Bed Mobility: Sit to Supine Supine to sit: Min assist Sit to supine: Min guard General bed mobility comments: Min A for trunk support into sitting Transfers Overall transfer level: Needs assistance Equipment used: None Transfers: Sit to/from Stand Sit to Stand: Min guard Stand  pivot transfers: Min assist General transfer comment: Min A for initating power up and maintaining balance. Requesting hand held A during pivot to recliner Ambulation/Gait Ambulation/Gait assistance: Min guard Gait Distance (Feet): 30 Feet Assistive device: None(pt reaching for furniture or objects for UE support) Gait Pattern/deviations: Step-to pattern General Gait Details: pt with slowed step to gait, reaching for UE support when available but denying need for assistive device use Gait velocity: reduced Gait velocity interpretation: <1.8 ft/sec, indicate of risk for recurrent falls    ADL: ADL Overall ADL's : Needs assistance/impaired Eating/Feeding: Set up, Supervision/ safety, Sitting Eating/Feeding Details (indicate cue type and reason): Pt using RUE to hold fork and cut pancake. Reporting however, that she is having difficulty swallowing. ' Grooming: Minimal assistance, Sitting Upper Body Bathing: Minimal assistance, Sitting Lower Body Bathing: Minimal assistance, Sit to/from stand Upper Body Dressing : Minimal assistance, Sitting Lower Body Dressing: Minimal assistance, Sit to/from stand Toilet Transfer: Minimal assistance, Stand-pivot(simulated to recliner) Toilet Transfer Details (indicate cue type and reason): Min A for single hand held A and balance Functional mobility during ADLs: Minimal assistance(stand pivot only) General ADL Comments: Pt limited by lightheadedness (BP stable) and performing stand pivot to recliner. Agreeable to walk with PT later. Pt with limited ROM, strength, and coorindation at RUE impacting her functional performance  Cognition: Cognition Overall Cognitive Status: Within Functional Limits for tasks assessed Arousal/Alertness: Awake/alert Orientation Level: Oriented X4 Attention: Alternating Alternating Attention: Appears intact Memory: Appears intact Awareness: Appears intact Problem Solving: Appears intact Safety/Judgment: Appears  intact Cognition Arousal/Alertness: Awake/alert Behavior During Therapy: Flat affect Overall Cognitive Status: Within Functional Limits for tasks assessed Area of Impairment: Attention, Awareness, Problem solving Current Attention Level: Focused Awareness: Emergent Problem Solving: Slow processing, Difficulty sequencing, Requires verbal cues General Comments: Pt with slow processing and requiring increased time; seen during functional transfer and with using phone to text for communication   Blood pressure 100/65, pulse (!) 49, temperature 99 F (37.2 C), temperature source Oral, resp. rate 19, height 5\' 5"  (1.651 m), weight 69.4 kg, last menstrual period 06/30/2015, SpO2 93 %. Physical Exam  Nursing note and vitals reviewed. Constitutional: She appears well-developed and well-nourished.  HENT:  Head: Normocephalic.  Right Ear: External ear normal.  Left Ear: External ear normal.  Crani incision C/D/I with staples in place  Eyes: Pupils are equal, round, and reactive to light. Conjunctivae and EOM are normal. Right eye exhibits no discharge. Left eye exhibits no discharge.  Neck: No tracheal deviation present. No thyromegaly present.  Respiratory: Effort normal. No stridor. No respiratory distress.  GI: Soft. She exhibits no distension.  Musculoskeletal:     Cervical back: Normal range of motion.     Comments: No edema or tenderness in extremities  Neurological: She is alert.  Aphonic but able to nod Y/N appropriately to basic biographic questions.  Able to follow simple motor commands without difficulty.  Motor: LUE/LLE: 5/5 proximal distal RUE: 4+/5 proximal distal RLE: 4 -/5 proximal to distal  Skin: Skin is warm and dry.  Psychiatric:  Mood and affect appear to be normal    No results found for this or any previous visit (  from the past 24 hour(s)). MR BRAIN W WO CONTRAST  Result Date: 02/17/2020 CLINICAL DATA:  Brain/CNS neoplasm, assess treatment response status post  craniotomy for brain mass. EXAM: MRI HEAD WITHOUT AND WITH CONTRAST TECHNIQUE: Multiplanar, multiecho pulse sequences of the brain and surrounding structures were obtained without and with intravenous contrast. CONTRAST:  7.30mL GADAVIST GADOBUTROL 1 MMOL/ML IV SOLN COMPARISON:  Brain MRI 02/15/2020 FINDINGS: Brain: Postoperative changes from interval left parietal craniotomy for resection of a posterior paramedian left frontal lobe mass. There is a small amount of left frontal pneumocephalus. There is a small amount of expected postoperative blood products within the resection cavity and expected mild curvilinear enhancement along the resection cavity margins. There is mild parenchymal T2/FLAIR hyperintensity surrounding the resection cavity. This may reflect postoperative edema. A small amount of residual tumor cannot be excluded, particularly along the left anterolateral aspect of the resection cavity (series 6, image 23). There is no evidence of acute infarct. No midline shift. There is a trace extra-axial fluid collection deep to the cranioplasty. Stable, mild chronic small vessel ischemic disease. Vascular: Flow voids maintained within the proximal large arterial vessels. Skull and upper cervical spine: Left parietal craniotomy no focal marrow lesion. Sinuses/Orbits: Visualized orbits demonstrate no acute abnormality. Trace ethmoid sinus mucosal thickening. No significant mastoid effusion. IMPRESSION: Postoperative changes from interval resection of a posterior paramedian left frontal lobe mass. There is a small amount of parenchyma T2 hyperintensity surrounding the resection cavity which may reflect postoperative edema. A small amount of residual tumor cannot be excluded, particularly along the left anterolateral aspect of the resection cavity. Attention is recommended on follow-up. Electronically Signed   By: Kellie Simmering DO   On: 02/17/2020 14:18    Assessment/Plan: Diagnosis: GBM status post  resection Labs independently reviewed.  Records reviewed and summated above.  1. Does the need for close, 24 hr/day medical supervision in concert with the patient's rehab needs make it unreasonable for this patient to be served in a less intensive setting? No 2. Co-Morbidities requiring supervision/potential complications: New onset seizures (continue antiepileptics), bradycardia (monitor with increased activity), steroid-induced hyperglycemia, leukocytosis (repeat labs, cont to monitor for signs and symptoms of infection, further workup if indicated) 3. Due to safety, skin/wound care, disease management, pain management and patient education, does the patient require 24 hr/day rehab nursing? No 4. Does the patient require coordinated care of a physician, rehab nurse, therapy disciplines of PT/OT/SLP to address physical and functional deficits in the context of the above medical diagnosis(es)? No Addressing deficits in the following areas: balance, bathing, dressing, toileting, speech and psychosocial support 5. Can the patient actively participate in an intensive therapy program of at least 3 hrs of therapy per day at least 5 days per week? Yes 6. The potential for patient to make measurable gains while on inpatient rehab is good and fair 7. Anticipated functional outcomes upon discharge from inpatient rehab are modified independent  with PT, modified independent with OT, supervision with SLP. 8. Estimated rehab length of stay to reach the above functional goals is: 3-5 days. 9. Anticipated discharge destination: Home 10. Overall Rehab/Functional Prognosis: fair  RECOMMENDATIONS: This patient's condition is appropriate for continued rehabilitative care in the following setting: Home Excercise Program and Outpatient Therapy Patient and husband would like patient to be discharged home.  Given functional capacity on the evaluation, anticipate patient will continue to make progress and believe this is  reasonable. Patient has agreed to participate in recommended program. Yes Note that insurance  prior authorization may be required for reimbursement for recommended care.  Comment: Rehab Admissions Coordinator to follow up.  I have personally performed a face to face diagnostic evaluation, including, but not limited to relevant history and physical exam findings, of this patient and developed relevant assessment and plan.  Additionally, I have reviewed and concur with the physician assistant's documentation above.   Delice Lesch, MD, ABPMR Bary Leriche, PA-C 02/18/2020

## 2020-02-18 NOTE — Progress Notes (Signed)
  Speech Language Pathology Treatment: Cognitive-Linquistic  Patient Details Name: Hannah [name] MRN: [id number] DOB: 13-Jul-1961 Today's Date: 02/18/2020 Time: [id number] SLP Time Calculation (min) (ACUTE ONLY): 35 min  Assessment / Plan / Recommendation Clinical Impression  Excellent session; pt able to whisper the alphabet, numbers, and phrases to a metronome or her steps. Able to transition to repeating phonemes and monosyllabic words with a visual and phonemic cue x5. Some instances of spontaneous phonation. Pt showing excellent improvement. Provided activites to facilitate langauge and phonation for independent exercise.    HPI HPI: Hannah [name] is a 59 y.o. female who initially presented with a seizure and was found to have a left superior frontal gyrus infiltrating mass with a small amount of enhancement.  This was followed radiographically and after 1 month, showed progressive enhancement.  Given concern for glioma, after interdisciplinary discussion at tumor board, she was referred for resection of this mass. Underwent craniotomy on 4/14. 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

## 2020-02-18 NOTE — Progress Notes (Signed)
Inpatient Rehab Admissions:  Inpatient Rehab Consult received.  I met with patient and her daughter at the bedside for rehabilitation assessment and to discuss goals and expectations of an inpatient rehab admission.  Family would like to pursue discharge home with f/u at home health or outpatient, as recommended by PT/OT and reflected in CIR formal consult by Dr. Posey Pronto today.  No further questions.  CIR will sign off at this time.    Signed: Shann Medal, PT, DPT Admissions Coordinator [phone] 02/18/20  2:40 PM

## 2020-02-18 NOTE — Progress Notes (Signed)
Physical Therapy Treatment Patient Details Name: Hannah [name] MRN: [id number] DOB: [birth year] Today's Date: 02/18/2020    History of Present Illness 59 y.o. female who initially presented with a seizure and was found to have a left superior frontal gyrus infiltrating mass with a small amount of enhancement. Pt presents on 4/14 for craniotomy and resection of SMA tumor.    PT Comments    Pt tolerated treatment well with much improved activity tolerance and balance. Pt able to perform multiple dynamic gait tasks without UE support or physical assistance, and scores 51/56 on BERG balance test indicating a low falls risk. Pt will benefit from continued aggressive mobilization to continue improving gait and balance. PT continues to recommend discharge home with outpatient PT services.   Follow Up Recommendations  Outpatient PT;Supervision for mobility/OOB     Equipment Recommendations  None recommended by PT    Recommendations for Other Services       Precautions / Restrictions Precautions Precautions: Fall;Other (comment) Precaution Comments: speech deficits Restrictions Weight Bearing Restrictions: No    Mobility  Bed Mobility Overal bed mobility: (pt received standing in room, left sitting in recliner)                Transfers Overall transfer level: Needs assistance Equipment used: None Transfers: Sit to/from Stand Sit to Stand: Supervision            Ambulation/Gait Ambulation/Gait assistance: Supervision Gait Distance (Feet): 300 Feet Assistive device: None Gait Pattern/deviations: Step-through pattern Gait velocity: functional Gait velocity interpretation: 1.31 - 2.62 ft/sec, indicative of limited community ambulator General Gait Details: pt with steady step through gait, able to perform head turns, change gait speed, and stop abruptly without LOB. Pt also able to side step and ambulate backwards without LOB athough with reduced gait speed   Stairs             Wheelchair Mobility    Modified Rankin (Stroke Patients Only)       Balance Overall balance assessment: Needs assistance Sitting-balance support: No upper extremity supported;Feet supported Sitting balance-Leahy Scale: Good     Standing balance support: No upper extremity supported;During functional activity Standing balance-Leahy Scale: Good Standing balance comment: supervision                 Standardized Balance Assessment Standardized Balance Assessment : Berg Balance Test Berg Balance Test Sit to Stand: Able to stand without using hands and stabilize independently Standing Unsupported: Able to stand safely 2 minutes Sitting with Back Unsupported but Feet Supported on Floor or Stool: Able to sit safely and securely 2 minutes Stand to Sit: Sits safely with minimal use of hands Transfers: Able to transfer safely, minor use of hands Standing Unsupported with Eyes Closed: Able to stand 10 seconds safely Standing Ubsupported with Feet Together: Able to place feet together independently and stand 1 minute safely From Standing, Reach Forward with Outstretched Arm: Can reach confidently >25 cm (10") From Standing Position, Pick up Object from Floor: Able to pick up shoe, needs supervision From Standing Position, Turn to Look Behind Over each Shoulder: Looks behind from both sides and weight shifts well Turn 360 Degrees: Able to turn 360 degrees safely but slowly Standing Unsupported, Alternately Place Feet on Step/Stool: Able to stand independently and complete 8 steps >20 seconds Standing Unsupported, One Foot in Front: Able to plae foot ahead of the other independently and hold 30 seconds Standing on One Leg: Able to lift leg independently and hold > 10  seconds Total Score: 51        Cognition Arousal/Alertness: Awake/alert Behavior During Therapy: WFL for tasks assessed/performed Overall Cognitive Status: Impaired/Different from baseline Area of  Impairment: Memory                     Memory: Decreased short-term memory         General Comments: pt unable to recall room number after noting it when initially leaving room.      Exercises      General Comments General comments (skin integrity, edema, etc.): VSS on RA      Pertinent Vitals/Pain Pain Assessment: No/denies pain    Home Living                      Prior Function            PT Goals (current goals can now be found in the care plan section) Acute Rehab PT Goals Patient Stated Goal: To return to independence Progress towards PT goals: Progressing toward goals    Frequency    Min 4X/week      PT Plan Current plan remains appropriate    Co-evaluation              AM-PAC PT "6 Clicks" Mobility   Outcome Measure  Help needed turning from your back to your side while in a flat bed without using bedrails?: None Help needed moving from lying on your back to sitting on the side of a flat bed without using bedrails?: None Help needed moving to and from a bed to a chair (including a wheelchair)?: None Help needed standing up from a chair using your arms (e.g., wheelchair or bedside chair)?: None Help needed to walk in hospital room?: None Help needed climbing 3-5 steps with a railing? : A Little 6 Click Score: 23    End of Session   Activity Tolerance: Patient tolerated treatment well Patient left: in chair;with call bell/phone within reach;with family/visitor present Nurse Communication: Mobility status PT Visit Diagnosis: Unsteadiness on feet (R26.81);Other abnormalities of gait and mobility (R26.89);Muscle weakness (generalized) (M62.81);Pain     Time: [id number] PT Time Calculation (min) (ACUTE ONLY): 23 min  Charges:  $Gait Training: 8-22 mins $Neuromuscular Re-education: 8-22 mins                     Zenaida Niece, PT, DPT Acute Rehabilitation Pager: [phone]    Zenaida Niece 02/18/2020, 12:49 PM

## 2020-02-18 NOTE — Plan of Care (Signed)
  Problem: Clinical Measurements: Goal: Ability to maintain clinical measurements within normal limits will improve Outcome: Progressing   Problem: Activity: Goal: Risk for activity intolerance will decrease Outcome: Progressing   Problem: Coping: Goal: Level of anxiety will decrease Outcome: Progressing   

## 2020-02-19 NOTE — Progress Notes (Signed)
  NEUROSURGERY PROGRESS NOTE   No issues overnight. Pt reports improvement in speech production.   EXAM:  BP 128/81 (BP Location: Left Arm)   Pulse (!) 40   Temp 98 F (36.7 C) (Oral)   Resp 17   Ht 5\' 5"  (1.651 m)   Wt 69.4 kg   LMP 06/30/2015   SpO2 95%   BMI 25.46 kg/m   Awake, alert, oriented  Speech slowed but naming/repetition are intact Good strength RUE including deltoid/bicep/tricep. Mildly decreased grip strength and fine motor Wound c/d/i  IMPRESSION:  59 y.o. female POD# 3 s/p resection left SMA glioma, SMA syndrome including motor speech appears to be improving  PLAN: - Will cont observation today, likely d/c home with outpatient PT/OT/SLP tomorrow

## 2020-02-20 LAB — BPAM RBC
Blood Product Expiration Date: 202105122359
Blood Product Expiration Date: 202105132359
ISSUE DATE / TIME: 202104141309
ISSUE DATE / TIME: 202104141309
Unit Type and Rh: 7300
Unit Type and Rh: 7300

## 2020-02-20 LAB — TYPE AND SCREEN
ABO/RH(D): B POS
Antibody Screen: NEGATIVE
Unit division: 0
Unit division: 0

## 2020-02-20 MED ORDER — DEXAMETHASONE 4 MG PO TABS: 4.0000 mg | ORAL_TABLET | Freq: Three times a day (TID) | ORAL | 0 refills | Status: DC

## 2020-02-20 MED ORDER — DEXAMETHASONE 1 MG PO TABS: 1.0000 mg | ORAL_TABLET | Freq: Two times a day (BID) | ORAL | 0 refills | Status: DC

## 2020-02-20 MED ORDER — DEXAMETHASONE 2 MG PO TABS: 2.0000 mg | ORAL_TABLET | Freq: Three times a day (TID) | ORAL | 0 refills | Status: DC

## 2020-02-20 MED ORDER — DEXAMETHASONE 1 MG PO TABS: 1.0000 mg | ORAL_TABLET | Freq: Every day | ORAL | 0 refills | Status: DC

## 2020-02-20 MED ORDER — DEXAMETHASONE 2 MG PO TABS: 2.0000 mg | ORAL_TABLET | Freq: Two times a day (BID) | ORAL | 0 refills | Status: DC

## 2020-02-20 MED ORDER — HYDROCODONE-ACETAMINOPHEN 5-325 MG PO TABS: 1.0000 | ORAL_TABLET | ORAL | 0 refills | Status: DC | PRN

## 2020-02-20 NOTE — Progress Notes (Signed)
  NEUROSURGERY PROGRESS NOTE   No issues overnight. Pt reports "slow progress."    EXAM:  BP 111/67 (BP Location: Left Arm)   Pulse 65   Temp 97.9 F (36.6 C)   Resp 19   Ht 5\' 5"  (1.651 m)   Wt 69.4 kg   LMP 06/30/2015   SpO2 96%   BMI 25.46 kg/m   Awake, alert, oriented  Speech slowed but better than yesterday. naming/repetition intact Good strength RUE including deltoid/bicep/tricep. Mildly decreased grip strength and fine motor on right Wound c/d/i  IMPRESSION:  59 y.o. female POD# 4 s/p resection left SMA glioma, SMA syndrome including motor speech appears to be improving on daily basis.  PLAN: - Pt requests d/c home with outpatient PT/OT/SLP - will d/c on keppra and dex taper

## 2020-02-20 NOTE — Plan of Care (Signed)
  Problem: Health Behavior/Discharge Planning: Goal: Ability to manage health-related needs will improve Note: Received discharge order for patient. Discharge instructions reviewed with patient, including new medication/ prescriptions, diet, activity, wound care. Re-educated patient on tapering of Decadron medication and referred her to discharge instructions. Decadron administered prior to discharge. At this time patient have stated understanding of discharge instructions. Patient with no further questions at this time. Pt stable in no acute distress. PIV removed, intact. Pt off unit in wheelchair. Nursing care complete.

## 2020-02-20 NOTE — Discharge Summary (Signed)
Physician Discharge Summary  Patient ID: Hannah [name] MRN: [id number] DOB/AGE: Jun 15, 1961 59 y.o.  Admit date: 02/16/2020 Discharge date: 02/20/2020  Admission Diagnoses:  Brain tumor  Discharge Diagnoses:  Same Active Problems:   Brain tumor [facility])   Status post craniotomy   GBM (glioblastoma multiforme) ([place])   Steroid-induced hyperglycemia   Seizures (HCC)   Leucocytosis   Bradycardia \  Discharged Condition: Stable  Hospital Course:  Hannah [name] is a 59 y.o. female who was admitted for the below procedure. SMA syndrome improving each day. At time of discharge, pain was well controlled, ambulating with Pt/OT, tolerating po, voiding normal. Ready for discharge.  Treatments: Surgery    1. Craniotomy for resection of right frontal brain mass    2.  Motor mapping with phase reversal SSEP    3.  Computer-assisted neuronavigation  Discharge Exam: Blood pressure 111/67, pulse 65, temperature 97.9 F (36.6 C), resp. rate 19, height 5\' 5"  (1.651 m), weight 69.4 kg, last menstrual period 06/30/2015, SpO2 96 %. Awake, alert, oriented Speech slowed but better than yesterday. naming/repetition intact Good strength RUE including deltoid/bicep/tricep. Mildly decreased grip strength and fine motor on right Wound c/d/  Disposition: Discharge disposition: 01-Home or Self Care       Discharge Instructions    Call MD for:  difficulty breathing, headache or visual disturbances   Complete by: As directed    Call MD for:  persistant dizziness or light-headedness   Complete by: As directed    Call MD for:  redness, tenderness, or signs of infection (pain, swelling, redness, odor or green/yellow discharge around incision site)   Complete by: As directed    Call MD for:  severe uncontrolled pain   Complete by: As directed    Call MD for:  temperature >100.4   Complete by: As directed    Diet general   Complete by: As directed    Driving Restrictions   Complete by: As  directed    Do not drive until given clearance.   Increase activity slowly   Complete by: As directed    Lifting restrictions   Complete by: As directed    Do not lift anything >10lbs. Avoid bending and twisting in awkward positions. Avoid bending at the back.   May shower / Bathe   Complete by: As directed    In 24 hours. Okay to wash wound with warm soapy water. Avoid scrubbing the wound. Pat dry.   Remove dressing in 24 hours   Complete by: As directed      Allergies as of 02/20/2020   No Known Allergies     Medication List    TAKE these medications   dexamethasone 4 MG tablet Commonly known as: DECADRON Take 1 tablet (4 mg total) by mouth every 8 (eight) hours.   dexamethasone 2 MG tablet Commonly known as: DECADRON Take 1 tablet (2 mg total) by mouth every 8 (eight) hours. Start taking on: February 22, 2020   dexamethasone 2 MG tablet Commonly known as: DECADRON Take 1 tablet (2 mg total) by mouth every 12 (twelve) hours. Start taking on: February 24, 2020   dexamethasone 1 MG tablet Commonly known as: DECADRON Take 1 tablet (1 mg total) by mouth every 12 (twelve) hours. Start taking on: February 26, 2020   dexamethasone 1 MG tablet Commonly known as: DECADRON Take 1 tablet (1 mg total) by mouth daily. Start taking on: February 28, 2020   HYDROcodone-acetaminophen 5-325 MG tablet Commonly known as:  NORCO/VICODIN Take 1 tablet by mouth every 4 (four) hours as needed for moderate pain.   levETIRAcetam 500 MG tablet Commonly known as: KEPPRA Take 1 tablet (500 mg total) by mouth 2 (two) times daily.   MULTIVITAMIN ADULT PO Take 1 tablet by mouth daily.        SignedTraci Sermon 02/20/2020, 10:01 AM

## 2020-02-21 MED FILL — HYDROCODON-APAP 5-325: 5-325 | 4 days supply | Qty: 20 | Fill #0

## 2020-02-21 MED FILL — DEXAMETHASONE 1 MG TABLET: 1 | 10 days supply | Qty: 50 | Fill #0

## 2020-02-22 ENCOUNTER — Other Ambulatory Visit: Payer: Self-pay | Admitting: *Deleted

## 2020-02-22 ENCOUNTER — Encounter: Payer: Self-pay | Admitting: *Deleted

## 2020-02-22 ENCOUNTER — Telehealth: Payer: Self-pay | Admitting: *Deleted

## 2020-02-22 NOTE — Telephone Encounter (Signed)
Patients spouse called to discuss path results that were visible via mychart.  He states that this was found by his wife (the patient) and he wanted to know if they were interpreting the information correctly.    Advised that we could move up appt to today to discuss results if they would like.  Declined offer as of now.

## 2020-02-22 NOTE — Patient Outreach (Signed)
[facility]) Care Management  02/22/2020  Hannah [name] 10-31-1961 [id number]   Transition of care call/case closure   Referral received:02/16/20 Initial outreach:02/22/20 Insurance: [employer]    Subjective: Initial successful telephone call to patient's preferred number in order to complete transition of care assessment; 2 HIPAA identifiers verified. Explained purpose of call and completed transition of care assessment.  Hannah [name] states that she is doing pretty good , she discussed that her speech is a little slow and she has weakness in her right hand. She states looking forward to beginning outpatient therapy, she has called Neuro rehab and they have not received a referral yet. She reports incision area clean, with staples intact no sign of redness drainage, no temperature elevation . She reports pain control with taking tylenol. She reports tolerating diet, eating at least 3 small meals a day. She denies  bowel or bladder problems. She reports tolerating mobility in the home has tolerating taking shower.   Spouse/children are assisting with her  recovery.  She denies any ongoing health issues and says she does not need a referral to one of the  chronic disease management programs.  Shedoes not have the hospital indemnity She  uses a [facility] outpatient pharmacy,[facility]  .    Objective:  Hannah [name] was hospitalized at [facility] from 4/14-4/18/21 Left  frontal craniotomy  Comorbidities include: Cervical spondylosis , Gliobastome multiforme  She was discharged to home on 02/20/20 with recommended  Services for outpatient physical/occupational and speech therapy, no noted referral to Neuro rehab .    Assessment:  Patient voices good understanding of all discharge instructions.  See transition of care flowsheet for assessment details. Will benefit from assistance with follow up on outpatient therapy, patient eager to begin  services.     Plan:  Reviewed hospital discharge diagnosis of  Left frontal craniotomy for left frontal mass, Glioblastoma  and discharge treatment plan using hospital discharge instructions, assessing medication adherence, reviewing problems requiring provider notification, and discussing the importance of follow up with surgeon, primary care provider and/or specialists as directed. Placed call to [facility], spoke with Hannah [name], Dr. Marcello [name], CMA, discussed patient anticipated discharge plan per MD note for outpatient /PT/OT/ST. Patient expecting referral to Neuro rehab for services, no referral noted prior to discharge in epic.  Per patient she has called Neuro Rehab and no orders for referral. Hannah [name] states she will follow up with Dr. Marcello [name] after he is out of surgery and make contact with patient.  Patient agreeable to call in the next 4 business day for follow up outpatient therapy scheduling.   Will route successful outreach letter with [facility] Management pamphlet and 24 Hour Nurse Line Magnet to [place] Management clinical pool to be mailed to patient's home address.    Hannah Draft, RN, BSN  [place] Management Coordinator  [phone]- Mobile [phone]- Toll Free Main Office

## 2020-02-23 ENCOUNTER — Encounter: Payer: Self-pay | Admitting: Internal Medicine

## 2020-02-24 ENCOUNTER — Inpatient Hospital Stay: Payer: No Typology Code available for payment source | Attending: Internal Medicine | Admitting: Internal Medicine

## 2020-02-24 ENCOUNTER — Ambulatory Visit: Payer: No Typology Code available for payment source | Admitting: Speech Pathology

## 2020-02-24 ENCOUNTER — Other Ambulatory Visit: Payer: Self-pay

## 2020-02-24 VITALS — BP 132/83 | HR 56 | Temp 99.1°F | Resp 18 | Ht 65.0 in | Wt 148.4 lb

## 2020-02-24 DIAGNOSIS — R41841 Cognitive communication deficit: Secondary | ICD-10-CM

## 2020-02-24 DIAGNOSIS — R569 Unspecified convulsions: Secondary | ICD-10-CM | POA: Diagnosis not present

## 2020-02-24 DIAGNOSIS — R4701 Aphasia: Secondary | ICD-10-CM

## 2020-02-24 DIAGNOSIS — Z87891 Personal history of nicotine dependence: Secondary | ICD-10-CM | POA: Insufficient documentation

## 2020-02-24 DIAGNOSIS — Z79899 Other long term (current) drug therapy: Secondary | ICD-10-CM | POA: Insufficient documentation

## 2020-02-24 DIAGNOSIS — Z7952 Long term (current) use of systemic steroids: Secondary | ICD-10-CM | POA: Diagnosis not present

## 2020-02-24 DIAGNOSIS — C711 Malignant neoplasm of frontal lobe: Secondary | ICD-10-CM | POA: Diagnosis present

## 2020-02-24 DIAGNOSIS — C719 Malignant neoplasm of brain, unspecified: Secondary | ICD-10-CM | POA: Diagnosis not present

## 2020-02-24 NOTE — Progress Notes (Signed)
Hannah [name] at [place], Hannah [name] [id number] [phone]   Interval Evaluation  Date of Service: 02/24/20 Patient Name: Hannah [name] Patient MRN: [id number] Patient DOB: 22-Jul-1961 Provider: [name], MD  Identifying Statement:  Hannah [name] is a 59 y.o. female with left frontal glioblastoma   Oncologic History: Oncology History  GBM (glioblastoma multiforme) ([place])   Initial Diagnosis   GBM (glioblastoma multiforme) ([place])   02/17/2020 Surgery   Craniotomy, left frontal resection by Dr. Marcello Moores.  Path is glioblastoma.     Biomarkers:  MGMT Unknown.  IDH 1/2 Unknown.  EGFR Unknown  TERT Unknown   Interval History:  Hannah [name] presents today following craniotomy, resection and discharge from post-surgical care.  Her speech has improved dramatically, she still has trouble finding some words, but overall is making strides each day.  Able to speak in full conversation and follow along with others.  Her right arm feels "strong" but the hand is very clumsy.  She has difficulty with previously learned tasks such as writing with a pen and brushing her teeth.  No recurrence of seizures, and no frank headaches.  Does describe "strange sensations" along the incision but not described as painful.  No issues with walking.  H+P (01/20/20) Patient presented one month ago with sudden onset left arm and leg weakness and interrupted speech, [name], [name], stroke eval and CNS imaging which demonstrated a non-enhancing left frontal mass.  At this time she is back to baseline without recurrence of events, taking Keppra 563m twice per day.  No history or seizure or any other neurologic events.  She does describe being sleep deprived prior to seizure event.  She presents today after one month follow up MRI  scan.  Medications: Current Outpatient Medications on File Prior to Visit  Medication Sig Dispense Refill  . [START ON 02/28/2020] dexamethasone (DECADRON) 1 MG tablet Take 1 tablet (1 mg total) by mouth daily. 2 tablet 0  . [START ON 02/26/2020] dexamethasone (DECADRON) 1 MG tablet Take 1 tablet (1 mg total) by mouth every 12 (twelve) hours. 4 tablet 0  . dexamethasone (DECADRON) 2 MG tablet Take 1 tablet (2 mg total) by mouth every 12 (twelve) hours. 4 tablet 0  . dexamethasone (DECADRON) 2 MG tablet Take 1 tablet (2 mg total) by mouth every 8 (eight) hours. 6 tablet 0  . dexamethasone (DECADRON) 4 MG tablet Take 1 tablet (4 mg total) by mouth every 8 (eight) hours. 6 tablet 0  . HYDROcodone-acetaminophen (NORCO/VICODIN) 5-325 MG tablet Take 1 tablet by mouth every 4 (four) hours as needed for moderate pain. 20 tablet 0  . levETIRAcetam (KEPPRA) 500 MG tablet Take 1 tablet (500 mg total) by mouth 2 (two) times daily. 60 tablet 3  . Multiple Vitamin (MULTIVITAMIN ADULT PO) Take 1 tablet by mouth daily.      No current facility-administered medications on file prior to visit.    Allergies: No Known Allergies Past Medical History:  Past Medical History:  Diagnosis Date  . Cervical spondylolysis 12/15/2019   Skeleton: Mild cervical spondylosis C6-7. Incidental find.   . Left ankle sprain 10/07/2011  . [place]    partial   . Tick bite 02/2020   Past Surgical History:  Past Surgical History:  Procedure Laterality Date  .  APPLICATION OF CRANIAL NAVIGATION Left 02/16/2020   Procedure: APPLICATION OF CRANIAL NAVIGATION;  Surgeon: Vallarie Mare, MD;  Location: [place];  Service: Neurosurgery;  Laterality: Left;  . CRANIOTOMY Left 02/16/2020   Procedure: LEFT FRONTAL CRANIOTOMY FOR BRAIN TUMOR;  Surgeon: Vallarie Mare, MD;  Location: [place];  Service: Neurosurgery;  Laterality: Left;  . FOOT SURGERY Left    bone spur - local anesthesia  . TONSILECTOMY/ADENOIDECTOMY WITH  MYRINGOTOMY     tonsils and adenoids out only  . WISDOM TOOTH EXTRACTION     Social History:  Social History   Socioeconomic History  . Marital status: Married    Spouse name: Not on file  . Number of children: 3  . Years of education: Not on file  . Highest education level: Not on file  Occupational History  . Not on file  Tobacco Use  . Smoking status: Former Smoker    Years: 2.00    Types: Cigarettes    Quit date: 11/05/1979    Years since quitting: 40.3  . Smokeless tobacco: Never Used  . Tobacco comment: 1/2 pack a week  Substance and Sexual Activity  . Alcohol use: Yes    Alcohol/week: 0.0 standard drinks    Comment: 1 glass of wine weekly  . Drug use: No  . Sexual activity: Yes    Birth control/protection: Surgical  Other Topics Concern  . Not on file  Social History Narrative  . Not on file   Social Determinants of Health   Financial Resource Strain:   . Difficulty of Paying Living Expenses:   Food Insecurity:   . Worried About Charity fundraiser in the Last Year:   . Arboriculturist in the Last Year:   Transportation Needs:   . Film/video editor (Medical):   Marland Kitchen Lack of Transportation (Non-Medical):   Physical Activity:   . Days of Exercise per Week:   . Minutes of Exercise per Session:   Stress:   . Feeling of Stress :   Social Connections:   . Frequency of Communication with Friends and Family:   . Frequency of Social Gatherings with Friends and Family:   . Attends Religious Services:   . Active Member of Clubs or Organizations:   . Attends Archivist Meetings:   Marland Kitchen Marital Status:   Intimate Partner Violence:   . Fear of Current or Ex-Partner:   . Emotionally Abused:   Marland Kitchen Physically Abused:   . Sexually Abused:    Family History:  Family History  Problem Relation Age of Onset  . Hypertension Mother   . Stroke Mother   . Diabetes Mother   . Cancer Father 74       colon  . Hypertension Sister   . Colon polyps Sister   .  Heart disease Brother 78  . Diabetes Maternal Uncle   . Diabetes Maternal Uncle     Review of Systems: Constitutional: Doesn't report fevers, chills or abnormal weight loss Eyes: Doesn't report blurriness of vision Ears, nose, mouth, throat, and face: Doesn't report sore throat Respiratory: Doesn't report cough, dyspnea or wheezes Cardiovascular: Doesn't report palpitation, chest discomfort  Gastrointestinal:  Doesn't report nausea, constipation, diarrhea GU: Doesn't report incontinence Skin: Doesn't report skin rashes Neurological: Per HPI Musculoskeletal: Doesn't report joint pain Behavioral/Psych: Doesn't report anxiety  Physical Exam: Vitals:   02/24/20 1021  BP: 132/83  Pulse: (!) 56  Resp: 18  Temp: 99.1 F (37.3 C)  SpO2:  99%   KPS: 80. General: Alert, cooperative, pleasant, in no acute distress Head: Normal EENT: No conjunctival injection or scleral icterus.  Lungs: Resp effort normal Cardiac: Regular rate Abdomen: Non-distended abdomen Skin: No rashes cyanosis or petechiae. Extremities: No clubbing or edema  Neurologic Exam: Mental Status: Awake, alert, attentive to examiner. Oriented to self and environment. Language is fluent with intact comprehension.  Occasional interruptions in fluency.  Cranial Nerves: Visual acuity is grossly normal. Visual fields are full. Extra-ocular movements intact. No ptosis. Face is symmetric Motor: Tone and bulk are normal. Power is full in both arms and legs. Noted dyspraxia of right hand. Reflexes are symmetric, no pathologic reflexes present.  Sensory: Intact to light touch Gait: Normal.   Labs: I have reviewed the data as listed    Component Value Date/Time   NA 142 02/17/2020 0136   K 3.9 02/17/2020 0136   CL 109 02/17/2020 0136   CO2 24 02/17/2020 0136   GLUCOSE 142 (H) 02/17/2020 0136   GLUCOSE 85 10/13/2006 1158   BUN 6 02/17/2020 0136   CREATININE 0.78 02/17/2020 0136   CALCIUM 8.8 (L) 02/17/2020 0136   PROT  6.9 12/21/2019 1134   ALBUMIN 4.5 12/21/2019 1134   AST 16 12/21/2019 1134   ALT 14 12/21/2019 1134   ALKPHOS 55 12/21/2019 1134   BILITOT 0.9 12/21/2019 1134   GFRNONAA >60 02/17/2020 0136   GFRAA >60 02/17/2020 0136   Lab Results  Component Value Date   WBC 11.1 (H) 02/17/2020   NEUTROABS 10.4 (H) 02/17/2020   HGB 13.9 02/17/2020   HCT 40.6 02/17/2020   MCV 91.9 02/17/2020   PLT 225 02/17/2020    Imaging:  MR BRAIN W WO CONTRAST  Result Date: 02/17/2020 CLINICAL DATA:  Brain/CNS neoplasm, assess treatment response status post craniotomy for brain mass. EXAM: MRI HEAD WITHOUT AND WITH CONTRAST TECHNIQUE: Multiplanar, multiecho pulse sequences of the brain and surrounding structures were obtained without and with intravenous contrast. CONTRAST:  7.48m GADAVIST GADOBUTROL 1 MMOL/ML IV SOLN COMPARISON:  Brain MRI 02/15/2020 FINDINGS: Brain: Postoperative changes from interval left parietal craniotomy for resection of a posterior paramedian left frontal lobe mass. There is a small amount of left frontal pneumocephalus. There is a small amount of expected postoperative blood products within the resection cavity and expected mild curvilinear enhancement along the resection cavity margins. There is mild parenchymal T2/FLAIR hyperintensity surrounding the resection cavity. This may reflect postoperative edema. A small amount of residual tumor cannot be excluded, particularly along the left anterolateral aspect of the resection cavity (series 6, image 23). There is no evidence of acute infarct. No midline shift. There is a trace extra-axial fluid collection deep to the cranioplasty. Stable, mild chronic small vessel ischemic disease. Vascular: Flow voids maintained within the proximal large arterial vessels. Skull and upper cervical spine: Left parietal craniotomy no focal marrow lesion. Sinuses/Orbits: Visualized orbits demonstrate no acute abnormality. Trace ethmoid sinus mucosal thickening. No  significant mastoid effusion. IMPRESSION: Postoperative changes from interval resection of a posterior paramedian left frontal lobe mass. There is a small amount of parenchyma T2 hyperintensity surrounding the resection cavity which may reflect postoperative edema. A small amount of residual tumor cannot be excluded, particularly along the left anterolateral aspect of the resection cavity. Attention is recommended on follow-up. Electronically Signed   By: KKellie SimmeringDO   On: 02/17/2020 14:18   MR BRAIN W WO CONTRAST  Result Date: 02/15/2020 CLINICAL DATA:  Brain tumor, glioma. EXAM: MRI HEAD WITHOUT  AND WITH CONTRAST TECHNIQUE: Multiplanar, multiecho pulse sequences of the brain and surrounding structures were obtained without and with intravenous contrast. CONTRAST:  36m GADAVIST GADOBUTROL 1 MMOL/ML IV SOLN COMPARISON:  Prior brain MRI examinations 01/18/2020 and earlier FINDINGS: Brain: Again demonstrated is a T2/FLAIR hyperintense parenchymal mass within the paramedian posterior left frontal lobe. The main component of the mass has slightly increased in size since 01/18/2020, now measuring 3.0 x 2.6 x 2.7 cm (AP x TV x CC) (previously 3.0 x 2.6 x 2.5 cm). Additionally, the associated nodular enhancing focus has increased in size, now measuring 13 x 10 x 17 mm (AP x TV x CC) (previously 10 x 8 x 15 mm, remeasured on prior). As before, the mass demonstrates corresponding restricted diffusion. Stable mild chronic small vessel ischemic disease within the cerebral white matter. No evidence of acute infarct. No midline shift or extra-axial fluid collection. No chronic intracranial blood products. Vascular: Flow voids maintained within the proximal large arterial vessels. Skull and upper cervical spine: No focal marrow lesion. Sinuses/Orbits: Visualized orbits demonstrate no acute abnormality. Mild ethmoid sinus mucosal thickening. No significant mastoid effusion. IMPRESSION: Continued slight progressive change  of a parenchymal mass within the paramedian posterior left frontal lobe as compared to MRI 01/18/2019. There has been a slight interval increase in size of the overall mass and of an associated nodular enhancing component. 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 Electronically Signed   By: KKellie SimmeringDO   On: 02/15/2020 16:46    Pathology:  SURGICAL PATHOLOGY  CASE: [id number] PATIENT: [facility]   Clinical History: brain tumor, glioma (cm)   FINAL MICROSCOPIC DIAGNOSIS:   A. BRAIN, LEFT FRONTA, BIOPSY:  - Glioblastoma multiforme, WHO grade IV/IV.   B. BRAIN, LEFT FRONTAL, BIOPSY:  - Glioblastoma multiforme WHO grade IV/IV.  - See comment.   COMMENT:   Dr. CJeannie Donehas reviewed the case and concurs with this interpretation.  Molecular studies will be performed and the results reported separately.  Dr. VMickeal Skinnerwas paged on 02/18/2020.     INTRAOPERATIVE DIAGNOSIS:   A. Left frontal brain tumor: "Lesional tissue obtained"  Intraoperative diagnosis rendered by Dr. CJeannie Doneat 4:55 PM on  02/16/2020.   GROSS DESCRIPTION:   A: The specimen is received fresh for rapid intraoperative consultation  and consists of a 0.3 x 0.1 x 0.1 cm aggregate of tan soft tissue. The  specimen is entirely submitted for frozen section analysis, and the  remnant is resubmitted for permanent 1cassette.   B: The specimen is received fresh and consists of a 3.5 x 2.6 x 1.8 cm  piece of tan-pink to red soft tissue. Sectioning reveals a tan-red  softened cut surface. Representative sections are submitted in 3  cassettes. (Craig Staggers4/16/2021)     Final Diagnosis performed by JEnid Cutter MD.  Electronically signed  02/18/2020   Assessment/Plan GBM (glioblastoma multiforme) ([place] [C71.9]  We appreciate the opportunity to participate in the care of Estee A Fana.  She is clinically improving following successful craniotomy and resection of enhancing  volume on 02/17/20.  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  Today we extensively discussed her pathology, post-op imaging, clinical features, missing/pending genomic data, and overall prognosis.  She has many encouraging features including small minimally enhancing fully resected mass, likely return to full functional status following PT/OT, and lack of comorbidity. We also discussed broader context goals of care.   We  reviewed best options for upfront therapy for glioblastoma, including IMRT and concurrent Temodar. We also reviewed several clinical trials available locally, including Ramipril study here at [facility].      Screening for potential clinical trials was performed and discussed using eligibility criteria for active protocols at [facility], loco-regional tertiary centers, as well as national database available on [email or web address].com.    The patient is still pending consideration of a research protocol at this time.  We recommended decreasing decadron to 54m daily x3 days, followed by 144mdaily x3 days, then stopping.  Will meet with Dr. ThMarcello Mooresext Monday.  She would like to return in 1 week to continue conversation regarding best plan of care moving forward, due to complexity of decision making and active recovery from surgery <1 week ago.   Keppra should be continued at 50011mID for now, will be refilled.  Will follow up next week.  All questions were answered. The patient knows to call the clinic with any problems, questions or concerns. No barriers to learning were detected.  The total time spent in the encounter was 40 minutes and more than 50% was on counseling and review of test results   ZacVentura SellersD Medical Director of Neuro-Oncology [facility] [place]/22/21 10:20 AM

## 2020-02-24 NOTE — Patient Instructions (Signed)
Websites/apps for language and cognitive activities: TalkPath Therapy (therapy.PaidProducts.be) and [employer] (free trial, then paid subscription). Look for activities focused on Language or Cognition  Tips for Talking with People who have Aphasia  . Say one thing at a time . Don't  rush - slow down, be patient . Talk face to face . Reduce background noise . Relax - be natural . Use pen and paper . Write down key words . Draw diagrams or pictures . Don't pretend you understand . Ask what helps . Recap - check you both understand . Be a partner, not a therapist   Aphasia does not affect intelligence, only language. The person with aphasia can still: make decisions, have opinions, and socialize.   Describing words  What group does it belong to?  What do I use it for?  Where can I find it?  What does it LOOK like?  What other words go with it?  What is the 1st sound of the word?    Many Ways to Communicate  Describe it Write it Draw it Gesture it Use related words  There's an App for that: Family Feud, Heads up, Stop-fun categories, What if, [employer]

## 2020-02-25 ENCOUNTER — Ambulatory Visit: Payer: No Typology Code available for payment source | Admitting: Rehabilitation

## 2020-02-25 ENCOUNTER — Encounter: Payer: Self-pay | Admitting: Rehabilitation

## 2020-02-25 ENCOUNTER — Other Ambulatory Visit: Payer: Self-pay | Admitting: *Deleted

## 2020-02-25 DIAGNOSIS — I69851 Hemiplegia and hemiparesis following other cerebrovascular disease affecting right dominant side: Secondary | ICD-10-CM

## 2020-02-25 DIAGNOSIS — R2681 Unsteadiness on feet: Secondary | ICD-10-CM

## 2020-02-25 DIAGNOSIS — R2689 Other abnormalities of gait and mobility: Secondary | ICD-10-CM

## 2020-02-25 DIAGNOSIS — C711 Malignant neoplasm of frontal lobe: Secondary | ICD-10-CM | POA: Diagnosis not present

## 2020-02-25 DIAGNOSIS — R42 Dizziness and giddiness: Secondary | ICD-10-CM

## 2020-02-25 NOTE — Therapy (Signed)
[facility] [address] [place], [place], [address] Phone: [phone]   Fax:  [phone]  Speech Language Pathology Evaluation  Patient Details  Name: Hannah [name] MRN: [id number] Date of Birth: [birth year] Referring Provider (SLP): Duffy Rhody, MD   Encounter Date: 02/24/2020  End of Session - 02/25/20 0907    Visit Number  1    Number of Visits  5    Date for SLP Re-Evaluation  04/25/20    Authorization Type  [employer]- [place] Start Time  4210    SLP Stop Time   3128    SLP Time Calculation (min)  50 min    Activity Tolerance  Patient tolerated treatment well       Past Medical History:  Diagnosis Date  . Cervical spondylolysis 12/15/2019   Skeleton: Mild cervical spondylosis C6-7. Incidental find.   . Left ankle sprain 10/07/2011  . Seizure ([facility])    partial   . Tick bite 02/2020    Past Surgical History:  Procedure Laterality Date  . APPLICATION OF CRANIAL NAVIGATION Left 02/16/2020   Procedure: APPLICATION OF CRANIAL NAVIGATION;  Surgeon: Vallarie Mare, MD;  Location: [facility];  Service: Neurosurgery;  Laterality: Left;  . CRANIOTOMY Left 02/16/2020   Procedure: LEFT FRONTAL CRANIOTOMY FOR BRAIN TUMOR;  Surgeon: Vallarie Mare, MD;  Location: [facility];  Service: Neurosurgery;  Laterality: Left;  . FOOT SURGERY Left    bone spur - local anesthesia  . TONSILECTOMY/ADENOIDECTOMY WITH MYRINGOTOMY     tonsils and adenoids out only  . WISDOM TOOTH EXTRACTION      There were no vitals filed for this visit.  Subjective Assessment - 02/24/20 1357    Subjective  "It's like a syntax error."    Patient is accompained by:  Family member   husband Lanny Hurst   Currently in Pain?  No/denies         SLP Evaluation [facility] - 02/25/20 0840      SLP Visit Information   SLP Received On  02/24/20    Referring Provider (SLP)  Duffy Rhody, MD    Onset Date  referral 02/23/20    Medical Diagnosis   malignant neoplasm of brain      Subjective   Subjective  "I'm searching for words."    Patient/Family Stated Goal  improve concentration, wordfinding      Pain Assessment   Currently in Pain?  No/denies      General Information   HPI  [name] is a 59 y.o. female who initially presented with a seizure and was found to have a left superior frontal gyrus infiltrating mass with a small amount of enhancement.  This was followed radiographically and after 1 month, showed progressive enhancement.  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 Met with Dr. Mickeal Skinner yesterday; she has not yet made decisions re: treatment plan.     Behavioral/Cognition  alert, pleasant, cooperative    Mobility Status  ambulated unassisted      Prior Functional Status   Cognitive/Linguistic Baseline  Within functional limits    Type of Home  House     Lives With  Spouse    Available Support  Family    Vocation  Full time employment   front office at [employer]     Cognition   Overall Cognitive Status  Impaired/Different from baseline    Attention  Alternating;Divided    Alternating Attention  Impaired    Alternating Attention Impairment  --   trailmaking errors; pt IDs and corrects with extra time   Divided Attention  Impaired    Divided Attention Impairment  --   reports difficulty multitasking   Memory  Impaired    Memory Impairment  --   design memory, story retell impaired; processing contributes   Awareness  Appears intact    Problem Solving  --   slow processing   Executive Function  --   self-monitors, corrects with extra time   Behaviors  Other (comment)   easily fatigued     Auditory Comprehension   Overall Auditory Comprehension  Appears within functional limits for tasks assessed      Visual Recognition/Discrimination    Discrimination  Within Function Limits      Reading Comprehension   Reading Status  Not tested      Expression   Primary Mode of Expression  Verbal      Verbal Expression   Overall Verbal Expression  Impaired    Initiation  No impairment    Automatic Speech  Name;Social Response    Level of Generative/Spontaneous Verbalization  Conversation    Repetition  No impairment    Naming  Impairment    Responsive  76-100% accurate    Confrontation  75-100% accurate   14/15 BNT short form   Divergent  --   15 animals in 60 seconds   Verbal Errors  Aware of errors    Pragmatics  No impairment    Effective Techniques  Open ended questions;Other (Comment)   extra time   Non-Verbal Means of Communication  Not applicable      Written Expression   Dominant Hand  Right    Written Expression  Not tested   may assess further     Oral Motor/Sensory Function   Overall Oral Motor/Sensory Function  Appears within functional limits for tasks assessed      Motor Speech   Overall Motor Speech  Appears within functional limits for tasks assessed   hesitations primarily wordfinding     Standardized Assessments   Standardized Assessments   Cognitive Linguistic Quick Test;Boston Naming Test-2nd edition    Boston Naming Test-2nd edition   short form: 14/15      Cognitive Linguistic Quick Test (Ages 18-69)   Attention  WNL    Memory  Mild    Executive Function  WNL    Language  WNL    Visuospatial Skills  WNL    Severity Rating Total  19    Composite Severity Rating  15.8                      SLP Education - 02/25/20 0906    Education Details  proposed therapy goals/plan of care    Person(s) Educated  Patient;Spouse    Methods  Explanation    Comprehension  Verbalized understanding         SLP Long Term Goals - 02/25/20 0910      SLP LONG TERM GOAL #1   Title  Patient will demonstrate understanding of appropriate cognitive-linguistic activities for home practice.     Time  4    Period  Weeks    Status  New      SLP LONG TERM GOAL #2   Title  Patient will use compensations for anomia in 15 minutes mod complex  conversation functionally.    Time  4    Period  Weeks    Status  New      SLP LONG TERM GOAL #3   Title  Pt will report use of aids/strategies to assist with multitasking, schedule and information management.    Time  4    Period  Weeks    Status  New       Plan - 02/25/20 0908    Clinical Impression Statement  Mrs. Fathima Tunison presents with overall mild higher level cognitive and language deficits s/p resection of left frontal glioblastoma. No longer having difficulties with speech initiation. She reports significant and steady improvements since discharge from hospital. She was able to converse at mod-complex level, though with notable hesitations, wordfinding difficulties. She has trouble with thought organization and concentration. She reports symptoms increase when she is fatigued. She met with Dr. Mickeal Skinner this morning; she has follow-up next week to discuss treatment options. At this time she is most interested in learning activities she can do at home to advance her cognitive-linguistic skills. She may consider a more intensive course of therapy but would like to make decisions about her overall treatment plan for glioblastoma first. Patient enjoys being physically active in the outdoors; she also enjoys games like majong, sequence, and other card games. Educated pt today re: communication strategies for aphasia, compensations for attention, and rest breaks/energy management for cognitive fatigue. I recommend brief course of ST to continue training in cognitive/language home program and compensatory strategies to improve communication and cognitive skills.    Speech Therapy Frequency  1x /week    Duration  4 weeks    Treatment/Interventions  Language facilitation;Cueing hierarchy;SLP instruction and feedback;Compensatory techniques;Cognitive  reorganization;Functional tasks;Compensatory strategies;Multimodal communcation approach;Internal/external aids;Patient/family education    Potential to Achieve Goals  Good    Consulted and Agree with Plan of Care  Patient;Family member/caregiver    Family Member Consulted  husband       Patient will benefit from skilled therapeutic intervention in order to improve the following deficits and impairments:   Aphasia  Cognitive communication deficit    Problem List Patient Active Problem List   Diagnosis Date Noted  . GBM (glioblastoma multiforme) ([facility])   . Steroid-induced hyperglycemia   . Seizures ([facility])   . Leucocytosis   . Bradycardia   . Status post craniotomy 02/16/2020  . Tick bite 02/08/2020  . History of recent hospitalization 12/21/2019  . Seizure disorder ([facility]) 12/21/2019  . Hypokalemia 12/21/2019  . Long-term use of high-risk medication 12/21/2019  . Frontal mass of brain 12/16/2019  . Family history of colon cancer - father 74 05/26/2015  . Colon cancer screening 03/10/2015  . Vitamin D deficiency 03/10/2015   Deneise Lever, [place], [place] 02/25/2020, 9:37 AM  [facility] [address] [place], [place], [address] Phone: [phone]   Fax:  [phone]  Name: FLORIENE JESCHKE MRN: [id number] Date of Birth: 26-May-1961

## 2020-02-25 NOTE — Patient Instructions (Signed)
Verbally discussed doing corner balance with feet apart, EC x 15-20 secs (to produce only 3-4 point increase on 0-10 scale in dizziness).  Also discussed doing head motion with eyes closed x 5 reps (if able to increase dizziness 3-4 points). Will need to add to formal HEP next visit.

## 2020-02-25 NOTE — Therapy (Signed)
[facility] [address] [place], [place], [address] Phone: [phone]   Fax:  [phone]  Physical Therapy Evaluation  Patient Details  Name: Hannah [name] MRN: [id number] Date of Birth: 09-29-1961 Referring Provider (PT): Duffy Rhody, MD   Encounter Date: 02/25/2020  PT End of Session - 02/25/20 1034    Visit Number  1    Number of Visits  21    Date for PT Re-Evaluation  04/25/20    Authorization Type  [place] focus and [employer] (unknown visit limit/copay at this time)    PT Start Time  0845    PT Stop Time  0938    PT Time Calculation (min)  53 min    Activity Tolerance  Patient tolerated treatment well   some increase in dizziness with certain tasks   Behavior During Therapy  [facility] for tasks assessed/performed       Past Medical History:  Diagnosis Date  . Cervical spondylolysis 12/15/2019   Skeleton: Mild cervical spondylosis C6-7. Incidental find.   . Left ankle sprain 10/07/2011  . Seizure ([facility])    partial   . Tick bite 02/2020    Past Surgical History:  Procedure Laterality Date  . APPLICATION OF CRANIAL NAVIGATION Left 02/16/2020   Procedure: APPLICATION OF CRANIAL NAVIGATION;  Surgeon: Vallarie Mare, MD;  Location: [facility];  Service: Neurosurgery;  Laterality: Left;  . CRANIOTOMY Left 02/16/2020   Procedure: LEFT FRONTAL CRANIOTOMY FOR BRAIN TUMOR;  Surgeon: Vallarie Mare, MD;  Location: [facility];  Service: Neurosurgery;  Laterality: Left;  . FOOT SURGERY Left    bone spur - local anesthesia  . TONSILECTOMY/ADENOIDECTOMY WITH MYRINGOTOMY     tonsils and adenoids out only  . WISDOM TOOTH EXTRACTION      There were no vitals filed for this visit.   Subjective Assessment - 02/25/20 0845    Subjective  "Every day is a new day.  Everything is changing.  The first few days after surgery, I didn't notice anything wrong with my right foot.  The last two days, it feels like my R foot is  asleep.  It makes me not trust that foot.  This morning it feels numb like as if I had on a flip flop.  Also my hand is affected.  My dexterity is not there.  I can't use toothbrush, earrings, buttons are difficult."    Patient is accompained by:  Family member    Pertinent History  No signitifcant history prior to GBM s/p frontal craniotomy 4/14    Limitations  Walking    How long can you walk comfortably?  Can ambulate about 30 mins, is working her way back into activity.  Is going housework, stairs    Patient Stated Goals  "I want to get back to running, playing tennis, hiking, biking.  I want to return to work"    Currently in Pain?  No/denies         [facility] PT Assessment - 02/25/20 [id number]      Assessment   Medical Diagnosis  GBM s/p craniotomy     Referring Provider (PT)  Duffy Rhody, MD    Onset Date/Surgical Date  02/16/20    Prior Therapy  acute PT      Restrictions   Other Position/Activity Restrictions  no lifting 40lbs or more, no driving       Balance Screen   Has the patient fallen in the past 6 months  No  Has the patient had a decrease in activity level because of a fear of falling?   No    Is the patient reluctant to leave their home because of a fear of falling?   No      Home Environment   Living Environment  Private residence    Living Arrangements  Children;Spouse/significant other    Available Help at Discharge  Family;Available 24 hours/day    Type of [place] to enter    Entrance Stairs-Number of Steps  2   in garage, front has 5-6   Entrance Stairs-Rails  None   rail on both sides in front    Home Layout  Two level;Bed/bath upstairs    Alternate Level Stairs-Number of Steps  14    Alternate Level Stairs-Rails  Left   then switches halfway to R side    Home Equipment  None      Prior Function   Level of Independence  Independent    Vocation  Full time employment    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 [place]   Overall Cognitive Status  Impaired/Different from baseline    Behaviors  --   see SLP notes on cognition     Sensation   Light Touch  Impaired Detail    Light Touch Impaired Details  Impaired RLE   foot only (did go up leg once)   Hot/Cold  Not tested   pt unsure      Coordination   Gross Motor Movements are Fluid and Coordinated  Yes    Fine Motor Movements are Fluid and Coordinated  No    Heel Shin Test  slowed, not dysmetric       ROM / Strength   AROM / PROM / Strength  Strength      Strength   Overall Strength  Deficits    Overall Strength Comments  R hip flex 3+/5, otherwise grossly 4/5 to 5/5      Transfers   Transfers  Sit to Stand;Stand to Sit    Sit to Stand  6: Modified independent (Device/Increase time)    Five time sit to stand comments   16.72 secs without UE support     Stand to Sit  6: Modified independent (Device/Increase time)      Ambulation/Gait   Ambulation/Gait  Yes    Ambulation/Gait Assistance  5: Supervision;4: Min guard    Ambulation/Gait Assistance Details  S to min/guard esp during balance challenges.  Pt unable to perform forward gait with eyes closed due to significant fear of falling.      Ambulation Distance (Feet)  300 Feet    Assistive device  None    Gait Pattern  Step-through pattern;Decreased stride length;Trunk flexed    Ambulation Surface  Level;Indoor    Gait velocity  3.55 ft/sec without AD     Stairs  Yes    Stairs Assistance  5: Supervision    Stairs Assistance Details (indicate cue type and reason)  for safety    Stair Management Technique  One rail Right;Alternating pattern;Forwards    Number of Stairs  4    Height of Stairs  6      Functional Gait  Assessment   Gait assessed   Yes    Gait Level Surface  Walks 20 ft in less than 7 sec but greater than 5.5 sec, uses assistive  device, slower speed, mild gait deviations, or deviates 6-10 in outside of the 12 in walkway width.    5.66 secs    Change in Gait Speed  Able to change speed, demonstrates mild gait deviations, deviates 6-10 in outside of the 12 in walkway width, or no gait deviations, unable to achieve a major change in velocity, or uses a change in velocity, or uses an assistive device.    Gait with Horizontal Head Turns  Performs head turns smoothly with slight change in gait velocity (eg, minor disruption to smooth gait path), deviates 6-10 in outside 12 in walkway width, or uses an assistive device.   dizzy with R and L   Gait with Vertical Head Turns  Performs task with slight change in gait velocity (eg, minor disruption to smooth gait path), deviates 6 - 10 in outside 12 in walkway width or uses assistive device   dizzy with upward gaze    Gait and Pivot Turn  Pivot turns safely within 3 sec and stops quickly with no loss of balance.    Step Over Obstacle  Is able to step over one shoe box (4.5 in total height) but must slow down and adjust steps to clear box safely. May require verbal cueing.    Gait with Narrow Base of Support  Is able to ambulate for 10 steps heel to toe with no staggering.    Gait with Eyes Closed  Cannot walk 20 ft without assistance, severe gait deviations or imbalance, deviates greater than 15 in outside 12 in walkway width or will not attempt task.    Ambulating Backwards  Walks 20 ft, slow speed, abnormal gait pattern, evidence for imbalance, deviates 10-15 in outside 12 in walkway width.    Steps  Alternating feet, must use rail.    Total Score  18    FGA comment:  < 19 = high risk fall           Vestibular Assessment - 02/25/20 0900      Vestibular Assessment   General Observation  normal alignment, extremely hesitant to close eyes or look up      Symptom Behavior   Subjective history of current problem  dizziness intermittently for 2 years, she has self treated with Epley before, has increased since surgery     Type of Dizziness   Unsteady with head/body  turns;Vertigo;Spinning    Frequency of Dizziness  lying down, looking up, eyes closed    Duration of Dizziness  only for a few seconds    Symptom Nature  Positional;Motion provoked    Aggravating Factors  Looking up to the ceiling;Turning head quickly;Turning head sideways;Lying supine    Relieving Factors  Head stationary;Rest    Progression of Symptoms  Worse    History of similar episodes  over the last 2 years, also had episode 5 years ago       Oculomotor Exam   Oculomotor Alignment  Normal      Vestibulo-Ocular Reflex   VOR 1 Head Only (x 1 viewing)  normal left eye movement, R eye with 2-3 saccades    reports increased in symptoms         Objective measurements completed on examination: See above findings.              PT Education - 02/25/20 1033    Education Details  Provided education on evaluation results, POC, goals, vestibular assessment to determine if vertigo is positional/treatments    Person(s) Educated  Patient;Child(ren)    Methods  Explanation    Comprehension  Verbalized understanding       PT Short Term Goals - 02/25/20 1043      PT SHORT TERM GOAL #1   Title  Pt will be indepenent with initial HEP in order to indicate improved functional mobility and decreased fall risk.  (Target Date: 03/26/20)    Time  4    Period  Weeks    Status  New    Target Date  03/26/20      PT SHORT TERM GOAL #2   Title  Pt will improve 5TSS to </=13 secs without UE support in order to indicate improved functional strength.    Time  4    Period  Weeks    Status  New      PT SHORT TERM GOAL #3   Title  Pt will improve FGA to >/=21/30 in order to indicate decreased fall risk.    Time  4    Period  Weeks    Status  New      PT SHORT TERM GOAL #4   Title  Pt will improve gait speed to >/=4.00 ft/sec in order to indicate more normal gait speed.    Time  4    Period  Weeks    Status  New      PT SHORT TERM GOAL #5   Title  Will participate in formal  vestibular assessment and LTGs to be written as appropriate.    Time  4    Period  Weeks    Status  New      Additional Short Term Goals   Additional Short Term Goals  Yes      PT SHORT TERM GOAL #6   Title  Pt will negotiate up/down 12 steps with single rail at mod I level (reciprocal pattern) in order to negotiate in home safely.    Time  4    Period  Weeks    Status  New        PT Long Term Goals - 02/25/20 1046      PT LONG TERM GOAL #1   Title  Pt will be independent with final HEP in order to indicate decreased fall risk and improved functional mobility (Target Date: 04/25/20)    Time  8    Period  Weeks    Status  New    Target Date  04/25/20      PT LONG TERM GOAL #2   Title  Pt will perform 5TSS </=11 secs in order to indicate improved functional strength.    Time  8    Period  Weeks    Status  New      PT LONG TERM GOAL #3   Title  Pt will improve FGA to >/=25/30 in order to indicate decreased fall risk.    Time  8    Period  Weeks    Status  New      PT LONG TERM GOAL #4   Title  Pt will ambulate >1000' over varying outdoor surfaces at mod I level while scanning environment without LOB in order to indicate safe community mobility.    Time  8    Period  Weeks    Status  New      PT LONG TERM GOAL #5   Title  Pt will demonstrate ability to begin jogging, hiking, and biking activities at S level in order to indicate return  to leisure activity.    Time  8    Period  Weeks    Status  New      Additional Long Term Goals   Additional Long Term Goals  Yes      PT LONG TERM GOAL #6   Title  Pt will negotiate up/down 12 steps without rail in reciprocal pattern while carrying items with BUEs to simulate return to ADLs.    Time  8    Period  Weeks    Status  New             Plan - 02/25/20 1035    Clinical Impression Statement  Pt presents with GBM s/p L frontal craniotomy on 02/16/20 with residual R sided weakness/numbness, decreased balance,  decreased endurance and dizziness.  Note no significant medical history prior to GBM, however she has had previous episodes of vertigo in the past. Upon PT evaluation, note gait speed of 3.55 ft/sec which is normal for her age, 5TSS time of 16.72 secs which is indicative of increased fall risk and decreased functional strength, and FGA score of 18/30 indicative of high fall risk.  Pt will benefit from further vestibular assessment to determine nature of dizziness.  Will continue to address this along with other mentioned deficits.    Personal Factors and Comorbidities  Comorbidity 1;Social Background;Profession;Fitness;Transportation    Comorbidities  craniotomy    Examination-Activity Limitations  Caring for Others;Carry;Lift;Locomotion Level;Reach Overhead;Stairs;Stand    Examination-Participation Restrictions  Community Activity;Driving;Yard Work;Meal Prep    Stability/Clinical Decision Making  Evolving/Moderate complexity    Clinical Decision Making  Moderate    Rehab Potential  Excellent    PT Frequency  Other (comment)   then 2x/wk for 4 weeks   PT Duration  Other (comment)   then 2x/wk for 4 weeks   PT Treatment/Interventions  ADLs/Self Care Home Management;Aquatic Therapy;Gait training;Stair training;Functional mobility training;Therapeutic activities;Balance training;Therapeutic exercise;Neuromuscular re-education;Cognitive remediation;Patient/family education;Passive range of motion;Vestibular    PT Next Visit Plan  vestibular assessment (I think she may have some peripheral vertigo going on, but def has issues with VOR), give foam tubing for toothbrush at home, initiate HEP for high level balance, gaze stabilization, EC, head motion    Consulted and Agree with Plan of Care  Patient;Family member/caregiver    Family Member Consulted  daughter       Patient will benefit from skilled therapeutic intervention in order to improve the following deficits and impairments:  Abnormal gait,  Decreased activity tolerance, Decreased balance, Decreased cognition, Decreased coordination, Decreased endurance, Decreased mobility, Decreased strength, Dizziness, Impaired sensation, Impaired UE functional use, Postural dysfunction  Visit Diagnosis: Unsteadiness on feet  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 GBM (glioblastoma multiforme) ([facility])   . Steroid-induced hyperglycemia   . Seizures ([facility])   . Leucocytosis   . Bradycardia   . Status post craniotomy 02/16/2020  . Tick bite 02/08/2020  . History of recent hospitalization 12/21/2019  . Seizure disorder ([facility]) 12/21/2019  . Hypokalemia 12/21/2019  . Long-term use of high-risk medication 12/21/2019  . Frontal mass of brain 12/16/2019  . Family history of colon cancer - father 73 05/26/2015  . Colon cancer screening 03/10/2015  . Vitamin D deficiency 03/10/2015    Cameron Sprang, PT, MPT [facility] [id number]  [place], [place], [address] Phone: [phone]   Fax:  [phone] 02/25/20, 10:52 AM  Name: Hannah [name] MRN: [id number] Date of Birth: Feb 21, 1961

## 2020-02-25 NOTE — Patient Outreach (Addendum)
[facility]) Care Management  02/25/2020  Hannah [name] [birth year] [id number]  Transition of care telephone call/case closure   Referral received:02/16/20 Initial outreach:02/22/20 Insurance: [place] [employer]  Subjective: 1136  Unsuccessful follow up call to patient , no answer able to leave a HIPAA compliant message for return call.   Incoming return call from patient, she reports doing much better,and being pleased with her progress every day getting better. She expressed looking forward to going back to work when stronger.  She reports follow up visit with oncologist and discussed treatment plans.  She states that  her pain is  being managed with prn tylenol . She reports that outpatient therapies has been arranged and she has had speech and physical therapy visit on this week. She reports already noticing improvement in speech, and working on exercises being taught .    Objective: Hannah Tomerlinwas hospitalized [facility] from 4/14-4/18/21 Left  frontal craniotomy  Comorbidities include: Cervical spondylosis , Gliobastome multiforme  She was discharged to home on 02/20/20 with recommended  Services for outpatient physical/occupational and speech therapy, has been arranged.    Plan: No further care management concerns identified, will plan case closure.    Joylene Draft, RN, BSN  [place] Management Coordinator  [phone]- Mobile [phone]- Toll Free Main Office

## 2020-02-29 ENCOUNTER — Other Ambulatory Visit: Payer: Self-pay

## 2020-02-29 ENCOUNTER — Ambulatory Visit: Payer: No Typology Code available for payment source | Admitting: Speech Pathology

## 2020-02-29 DIAGNOSIS — R41841 Cognitive communication deficit: Secondary | ICD-10-CM

## 2020-02-29 DIAGNOSIS — C711 Malignant neoplasm of frontal lobe: Secondary | ICD-10-CM | POA: Diagnosis not present

## 2020-02-29 DIAGNOSIS — R4701 Aphasia: Secondary | ICD-10-CM

## 2020-02-29 NOTE — Patient Instructions (Signed)
Love the idea of you writing stories and journaling. I think that's a great way to continue practicing your language.   Pay attention to your journaling; notice if you are overly fatigued or exhausted and how that impacts you the next day. It's important to build cognitive rest periods into your day. Schedule mentally challenging tasks or social events for parts of the day when you are fresh and alert. Give yourself time to rest after a more demanding activity.   Cognitive Activites online:   Website: [facility] (PaidProducts.be) Login: [email or web address] Password: therapy  Podcasts about the brain: (listen and if you want to work on your writing/thought formulation, you could write a brief summary or have a conversation with a loved one about what you listened to)  CardDash.uy  https://www.mindyourbrainfoundation.org/podcast/: there is some good information here from brain injury specialists about recovery and research-based interventions  CreditReportCA.tn  NoInsuranceAgent.es  https://adventuresinbraininjury.com/podcasts/

## 2020-02-29 NOTE — Therapy (Signed)
[facility] [address] [place], [place], [address] Phone: [phone]   Fax:  [phone]  Speech Language Pathology Treatment  Patient Details  Name: Hannah [name] MRN: [id number] Date of Birth: 07/27/1961 Referring Provider (SLP): Duffy Rhody, MD   Encounter Date: 02/29/2020  End of Session - 02/29/20 1427    Visit Number  2    Number of Visits  5    Date for SLP Re-Evaluation  04/25/20    Authorization Type  [place] Start Time  [id number]    SLP Stop Time   1400    SLP Time Calculation (min)  45 min    Activity Tolerance  Patient tolerated treatment well       Past Medical History:  Diagnosis Date  . Cervical spondylolysis 12/15/2019   Skeleton: Mild cervical spondylosis C6-7. Incidental find.   . Left ankle sprain 10/07/2011  . Seizure ([place])    partial   . Tick bite 02/2020    Past Surgical History:  Procedure Laterality Date  . APPLICATION OF CRANIAL NAVIGATION Left 02/16/2020   Procedure: APPLICATION OF CRANIAL NAVIGATION;  Surgeon: Vallarie Mare, MD;  Location: [facility];  Service: Neurosurgery;  Laterality: Left;  . CRANIOTOMY Left 02/16/2020   Procedure: LEFT FRONTAL CRANIOTOMY FOR BRAIN TUMOR;  Surgeon: Vallarie Mare, MD;  Location: [facility];  Service: Neurosurgery;  Laterality: Left;  . FOOT SURGERY Left    bone spur - local anesthesia  . TONSILECTOMY/ADENOIDECTOMY WITH MYRINGOTOMY     tonsils and adenoids out only  . WISDOM TOOTH EXTRACTION      There were no vitals filed for this visit.  Subjective Assessment - 02/29/20 1317    Subjective  "I wouldn't trust myself driving. My spatial awareness..."    Currently in Pain?  No/denies            ADULT SLP TREATMENT - 02/29/20 1410      General Information   Behavior/Cognition  Alert;Cooperative      Treatment Provided   Treatment provided  Cognitive-Linquistic      Cognitive-Linquistic Treatment   Treatment  focused on  Cognition;Aphasia;Patient/family/caregiver education    Skilled Treatment  Patient alone for session today; received ride from a friend who waited in parking lot. Patient showed SLP journal and stories she has been writing on her phone to document her symptoms and experience. SLP agreed with pt that this is an excellent activity for her to work on expressive language and thought organization. Pt also created a list of potential topics to journal about. Expressed an interest in brain recovery; SLP shared with pt a list of podcasts about the brain and brain injury/rehabilitation that might interest her. Set up [facility] therapy account for patient and added cognitive-linguistic activities for home practice. Patient reports feeling mentally fatigued at the end of the day. Education re: structuring rest breaks and managing cognitive reserves throughout the day. She has follow-up with Dr. Mickeal Skinner on Thursday to further discuss treatment options. SLP told pt we will continue to work with her and can adjust our plan of care if needed when she makes decisions about her treatment plan. Patient also had questions about if her speech/thinking will "go back to normal." Education re: recovery process and factors that influence progress.       Assessment / Recommendations / Plan   Plan  Continue with current plan of care      Progression Toward Goals  Progression toward goals  Progressing toward goals           SLP Long Term Goals - 02/29/20 1428      SLP LONG TERM GOAL #1   Title  Patient will demonstrate understanding of appropriate cognitive-linguistic activities for home practice.    Time  4    Period  Weeks    Status  On-going      SLP LONG TERM GOAL #2   Title  Patient will use compensations for anomia in 15 minutes mod complex conversation functionally.    Time  4    Period  Weeks    Status  On-going      SLP LONG TERM GOAL #3   Title  Pt will report use of aids/strategies to assist  with multitasking, schedule and information management.    Time  4    Period  Weeks    Status  On-going       Plan - 02/29/20 1427    Clinical Impression Statement  [name] presents with overall mild higher level cognitive and language deficits s/p resection of left frontal glioblastoma. No longer having difficulties with speech initiation. She reports significant and steady improvements since discharge from hospital. She was able to converse at mod-complex level, though with notable hesitations, wordfinding difficulties. She has trouble with thought organization and concentration. She reports symptoms increase when she is fatigued. She has follow-up this week to discuss treatment options with Dr. Mickeal Skinner. At this time she is most interested in learning activities she can do at home to advance her cognitive-linguistic skills. She may consider a more intensive course of therapy but would like to make decisions about her overall treatment plan for glioblastoma first. Patient enjoys being physically active in the outdoors; she also enjoys games like majong, sequence, and other card games. Educated pt today re: communication strategies for aphasia, compensations for attention, and rest breaks/energy management for cognitive fatigue. I recommend brief course of ST to continue training in cognitive/language home program and compensatory strategies to improve communication and cognitive skills.    Speech Therapy Frequency  1x /week    Duration  4 weeks    Treatment/Interventions  Language facilitation;Cueing hierarchy;SLP instruction and feedback;Compensatory techniques;Cognitive reorganization;Functional tasks;Compensatory strategies;Multimodal communcation approach;Internal/external aids;Patient/family education    Potential to Achieve Goals  Good    Consulted and Agree with Plan of Care  Patient;Family member/caregiver    Family Member Consulted  husband       Patient will benefit from  skilled therapeutic intervention in order to improve the following deficits and impairments:   Aphasia  Cognitive communication deficit    Problem List Patient Active Problem List   Diagnosis Date Noted  . GBM (glioblastoma multiforme) ([place])   . Steroid-induced hyperglycemia   . Seizures ([place])   . Leucocytosis   . Bradycardia   . Status post craniotomy 02/16/2020  . Tick bite 02/08/2020  . History of recent hospitalization 12/21/2019  . Seizure disorder ([place]) 12/21/2019  . Hypokalemia 12/21/2019  . Long-term use of high-risk medication 12/21/2019  . Frontal mass of brain 12/16/2019  . Family history of colon cancer - father 14 05/26/2015  . Colon cancer screening 03/10/2015  . Vitamin D deficiency 03/10/2015   Deneise Lever, [place], CCC-SLP Speech-Language Pathologist  Aliene Altes 02/29/2020, 2:29 PM  [facility] [address] [place], [place], [address] Phone: [phone]   Fax:  [phone]   Name: Hannah [name]  MRN: [id number] Date of Birth: 04-19-1961

## 2020-03-01 ENCOUNTER — Encounter (HOSPITAL_COMMUNITY): Payer: Self-pay | Admitting: Internal Medicine

## 2020-03-01 ENCOUNTER — Ambulatory Visit: Payer: No Typology Code available for payment source | Admitting: Physical Therapy

## 2020-03-01 ENCOUNTER — Encounter: Payer: Self-pay | Admitting: Physical Therapy

## 2020-03-01 DIAGNOSIS — R2681 Unsteadiness on feet: Secondary | ICD-10-CM

## 2020-03-01 DIAGNOSIS — R2689 Other abnormalities of gait and mobility: Secondary | ICD-10-CM

## 2020-03-01 DIAGNOSIS — R42 Dizziness and giddiness: Secondary | ICD-10-CM

## 2020-03-01 DIAGNOSIS — C711 Malignant neoplasm of frontal lobe: Secondary | ICD-10-CM | POA: Diagnosis not present

## 2020-03-01 NOTE — Therapy (Signed)
[facility] [address] [place], [place], [address] Phone: [phone]   Fax:  [phone]  Physical Therapy Treatment  Patient Details  Name: Hannah [name] MRN: [id number] Date of Birth: 1961-10-24 Referring Provider (PT): Duffy Rhody, MD   Encounter Date: 03/01/2020  PT End of Session - 03/01/20 0849    Visit Number  2    Number of Visits  21    Date for PT Re-Evaluation  04/25/20    Authorization Type  [place] focus and [employer] (unknown visit limit/copay at this time)    PT Start Time  0803    PT Stop Time  0849    PT Time Calculation (min)  46 min    Activity Tolerance  Patient tolerated treatment well   some increase in dizziness with certain tasks   Behavior During Therapy  [facility] for tasks assessed/performed       Past Medical History:  Diagnosis Date  . Cervical spondylolysis 12/15/2019   Skeleton: Mild cervical spondylosis C6-7. Incidental find.   . Left ankle sprain 10/07/2011  . Seizure ([place])    partial   . Tick bite 02/2020    Past Surgical History:  Procedure Laterality Date  . APPLICATION OF CRANIAL NAVIGATION Left 02/16/2020   Procedure: APPLICATION OF CRANIAL NAVIGATION;  Surgeon: Vallarie Mare, MD;  Location: [place];  Service: Neurosurgery;  Laterality: Left;  . CRANIOTOMY Left 02/16/2020   Procedure: LEFT FRONTAL CRANIOTOMY FOR BRAIN TUMOR;  Surgeon: Vallarie Mare, MD;  Location: [place];  Service: Neurosurgery;  Laterality: Left;  . FOOT SURGERY Left    bone spur - local anesthesia  . TONSILECTOMY/ADENOIDECTOMY WITH MYRINGOTOMY     tonsils and adenoids out only  . WISDOM TOOTH EXTRACTION      There were no vitals filed for this visit.  Subjective Assessment - 03/01/20 0808    Subjective  Finished with steroids.  No issues to report, no falls.  Pt feels she is regaining control of her R hand.    Patient is accompained by:  Family member    Pertinent History  No signitifcant  history prior to GBM s/p frontal craniotomy 4/14    Limitations  Walking    How long can you walk comfortably?  Can ambulate about 30 mins, is working her way back into activity.  Is going housework, stairs    Patient Stated Goals  "I want to get back to running, playing tennis, hiking, biking.  I want to return to work"    Currently in Pain?  No/denies             Vestibular Assessment - 03/01/20 0810      Symptom Behavior   Subjective history of current problem  Dizziness has been off and on for 5 years. [place] has been helpful but not sure if it was truly BPPV.  Felt like it was her R ear, learned about it on You Tube.  Episodes happen about every 6 months and then it comes back when doing weight lifting.  Has not experienced spinning recently.  Is experiencing dizziness but reports it is more of an imbalance, wooziness.    Type of Dizziness   Imbalance    Frequency of Dizziness  daily    Duration of Dizziness  only for a few seconds if she stops the activity    Symptom Nature  Motion provoked    Aggravating Factors  Looking up to the ceiling;Lying supine;Sitting with head  tilted back;Turning body quickly;Turning head quickly;Supine to sit;Forward bending    Relieving Factors  Head stationary;Rest    Progression of Symptoms  Worse      Oculomotor Exam   Oculomotor Alignment  Normal    Ocular ROM  WFL    Spontaneous  Absent    Gaze-induced   Absent    Smooth Pursuits  Intact   diplopia slightly L of center vision   Saccades  Slow;Hypermetric;Overshoots    Comment  Cover test/Uncover/alternating cover test - negative for tropia or phoria.  Convergence: 6 cm with recovery at 11 cm (5cm difference).  Wears lenses for near and far vision.      Oculomotor Exam-Fixation Suppressed    Left Head Impulse  negative    Right Head Impulse  negative      Vestibulo-Ocular Reflex   VOR to Slow Head Movement  Normal    VOR Cancellation  Comment   reported significant dizziness      Visual Acuity   Static  not tested due to monocular lenses      Positional Testing   Dix-Hallpike  Dix-Hallpike Right;Dix-Hallpike Left    Sidelying Test  --    Horizontal Canal Testing  Horizontal Canal Right;Horizontal Canal Left      Dix-Hallpike Right   Dix-Hallpike Right Duration  10 seconds    Dix-Hallpike Right Symptoms  Other (comment)   mild R rotary nystagmus     Dix-Hallpike Left   Dix-Hallpike Left Duration  0    Dix-Hallpike Left Symptoms  No nystagmus      Horizontal Canal Right   Horizontal Canal Right Duration  0    Horizontal Canal Right Symptoms  Normal      Horizontal Canal Left   Horizontal Canal Left Duration  0    Horizontal Canal Left Symptoms  Normal      Positional Sensitivities   Sit to Supine  Mild dizziness    Supine to Left Side  Mild dizziness    Supine to Right Side  Mild dizziness    Supine to Sitting  Moderate dizziness    Right Hallpike  Mild dizziness    Up from Right Hallpike  Mild dizziness    Up from Left Hallpike  No dizziness    Nose to Right Knee  Mild dizziness    Right Knee to Sitting  Mild dizziness    Nose to Left Knee  Mild dizziness    Left Knee to Sitting  Mild dizziness    Head Turning x 5  Severe dizziness    Head Nodding x 5  Mild dizziness    Pivot Right in Standing  No dizziness    Pivot Left in Standing  No dizziness    Rolling Right  Mild dizziness    Rolling Left  Mild dizziness                       PT Education - 03/01/20 1216    Education Details  clinical findings, plan for next visit    Person(s) Educated  Patient;Child(ren)    Methods  Explanation    Comprehension  Verbalized understanding       PT Short Term Goals - 02/25/20 1043      PT SHORT TERM GOAL #1   Title  Pt will be indepenent with initial HEP in order to indicate improved functional mobility and decreased fall risk.  (Target Date: 03/26/20)    Time  4  Period  Weeks    Status  New    Target Date  03/26/20      PT  SHORT TERM GOAL #2   Title  Pt will improve 5TSS to </=13 secs without UE support in order to indicate improved functional strength.    Time  4    Period  Weeks    Status  New      PT SHORT TERM GOAL #3   Title  Pt will improve FGA to >/=21/30 in order to indicate decreased fall risk.    Time  4    Period  Weeks    Status  New      PT SHORT TERM GOAL #4   Title  Pt will improve gait speed to >/=4.00 ft/sec in order to indicate more normal gait speed.    Time  4    Period  Weeks    Status  New      PT SHORT TERM GOAL #5   Title  Will participate in formal vestibular assessment and LTGs to be written as appropriate.    Time  4    Period  Weeks    Status  New      Additional Short Term Goals   Additional Short Term Goals  Yes      PT SHORT TERM GOAL #6   Title  Pt will negotiate up/down 12 steps with single rail at mod I level (reciprocal pattern) in order to negotiate in home safely.    Time  4    Period  Weeks    Status  New        PT Long Term Goals - 03/01/20 1229      PT LONG TERM GOAL #1   Title  Pt will be independent with final HEP in order to indicate decreased fall risk and improved functional mobility (Target Date: 04/25/20)    Time  8    Period  Weeks    Status  New      PT LONG TERM GOAL #2   Title  Pt will perform 5TSS </=11 secs in order to indicate improved functional strength.    Time  8    Period  Weeks    Status  New      PT LONG TERM GOAL #3   Title  Pt will improve FGA to >/=25/30 in order to indicate decreased fall risk.    Time  8    Period  Weeks    Status  New      PT LONG TERM GOAL #4   Title  Pt will ambulate >1000' over varying outdoor surfaces at mod I level while scanning environment without LOB in order to indicate safe community mobility.    Time  8    Period  Weeks    Status  New      PT LONG TERM GOAL #5   Title  Pt will demonstrate ability to begin jogging, hiking, and biking activities at S level in order to indicate  return to leisure activity.    Time  8    Period  Weeks    Status  New      PT LONG TERM GOAL #6   Title  Pt will negotiate up/down 12 steps without rail in reciprocal pattern while carrying items with BUEs to simulate return to ADLs.    Time  8    Period  Weeks    Status  New  PT LONG TERM GOAL #7   Title  Patient will report no dizziness with bed mobility, quick head and body turns, and bending down to the ground    Time  8    Period  Weeks    Status  New            Plan - 03/01/20 1217    Clinical Impression Statement  Continued with thorough visual screen and vestibular assessment with pt demonstrating impaired visual motion sensitivity and vestibular-visual interactions.  No issues with VOR and no indication of BPPV found.  Will initiate HEP for motion sensitivity next session.    Personal Factors and Comorbidities  Comorbidity 1;Social Background;Profession;Fitness;Transportation    Comorbidities  craniotomy    Examination-Activity Limitations  Caring for Others;Carry;Lift;Locomotion Level;Reach Overhead;Stairs;Stand    Examination-Participation Restrictions  Community Activity;Driving;Yard Work;Meal Prep    Stability/Clinical Decision Making  Evolving/Moderate complexity    Rehab Potential  Excellent    PT Frequency  Other (comment)   then 2x/wk for 4 weeks   PT Duration  Other (comment)   then 2x/wk for 4 weeks   PT Treatment/Interventions  ADLs/Self Care Home Management;Aquatic Therapy;Gait training;Stair training;Functional mobility training;Therapeutic activities;Balance training;Therapeutic exercise;Neuromuscular re-education;Cognitive remediation;Patient/family education;Passive range of motion;Vestibular    PT Next Visit Plan  initiate HEP for high level balance and habituation    Consulted and Agree with Plan of Care  Patient;Family member/caregiver    Family Member Consulted  daughter       Patient will benefit from skilled therapeutic intervention in  order to improve the following deficits and impairments:  Abnormal gait, Decreased activity tolerance, Decreased balance, Decreased cognition, Decreased coordination, Decreased endurance, Decreased mobility, Decreased strength, Dizziness, Impaired sensation, Impaired UE functional use, Postural dysfunction  Visit Diagnosis: Unsteadiness on feet  Dizziness and giddiness  Other abnormalities of gait and mobility     Problem List Patient Active Problem List   Diagnosis Date Noted  . GBM (glioblastoma multiforme) ([place])   . Steroid-induced hyperglycemia   . Seizures ([place])   . Leucocytosis   . Bradycardia   . Status post craniotomy 02/16/2020  . Tick bite 02/08/2020  . History of recent hospitalization 12/21/2019  . Seizure disorder ([place]) 12/21/2019  . Hypokalemia 12/21/2019  . Long-term use of high-risk medication 12/21/2019  . Frontal mass of brain 12/16/2019  . Family history of colon cancer - father 80 05/26/2015  . Colon cancer screening 03/10/2015  . Vitamin D deficiency 03/10/2015    Rico Junker, PT, DPT 03/01/20    12:32 PM    [facility] [address] [place], [place], [address] Phone: [phone]   Fax:  [phone]  Name: EVANN ALDANA MRN: [id number] Date of Birth: 03/08/1961

## 2020-03-02 ENCOUNTER — Inpatient Hospital Stay (HOSPITAL_BASED_OUTPATIENT_CLINIC_OR_DEPARTMENT_OTHER): Payer: No Typology Code available for payment source | Admitting: Internal Medicine

## 2020-03-02 ENCOUNTER — Other Ambulatory Visit: Payer: Self-pay

## 2020-03-02 VITALS — BP 142/88 | HR 61 | Temp 98.7°F | Resp 18 | Ht 65.0 in | Wt 150.2 lb

## 2020-03-02 DIAGNOSIS — C719 Malignant neoplasm of brain, unspecified: Secondary | ICD-10-CM | POA: Diagnosis not present

## 2020-03-02 DIAGNOSIS — R569 Unspecified convulsions: Secondary | ICD-10-CM

## 2020-03-02 DIAGNOSIS — C711 Malignant neoplasm of frontal lobe: Secondary | ICD-10-CM | POA: Diagnosis not present

## 2020-03-02 NOTE — Progress Notes (Signed)
[facility] [place],  [address] [phone]   Interval Evaluation  Date of Service: 03/02/20 Patient Name: Hannah Hannah [name] Patient MRN: [id number] Patient DOB: Oct 10, 1961 Provider: [name], MD  Identifying Statement:  Hannah Hannah [name] is a 59 y.o. female with left frontal glioblastoma   Oncologic History: Oncology History  GBM (glioblastoma multiforme) ([place])   Initial Diagnosis   GBM (glioblastoma multiforme) ([place])   02/17/2020 Surgery   Craniotomy, left frontal resection by Dr. Marcello Moores.  Path is glioblastoma.     Biomarkers:  MGMT Unknown.  IDH 1/2 Unknown.  EGFR Unknown  TERT Unknown   Interval History:  Hannah Hannah [name] for further discussion following her recent diagnosis of glioblastoma.  She denies new or progressive neurologic deficits from last week.  No recurrence of seizures.  No headaches or gait impairment.   H+P (01/20/20) Patient presented one month ago with sudden onset left arm and leg weakness and interrupted speech, [name], [name], stroke eval and CNS imaging which demonstrated a non-enhancing left frontal mass.  At this time she is back to baseline without recurrence of events, taking Keppra 517m twice per day.  No history or seizure or any other neurologic events.  She does describe being sleep deprived prior to seizure event.  She presents Hannah [name] after one month follow up MRI scan.  Medications: Current Outpatient Medications on File Prior to Visit  Medication Sig Dispense Refill  . dexamethasone (DECADRON) 1 MG tablet Take 1 tablet (1 mg total) by mouth daily. (Patient not taking: Reported on 02/24/2020) 2 tablet 0  . dexamethasone (DECADRON) 1 MG tablet Take 1 tablet (1 mg total) by mouth every 12 (twelve) hours. (Patient not taking: Reported on  02/24/2020) 4 tablet 0  . dexamethasone (DECADRON) 2 MG tablet Take 1 tablet (2 mg total) by mouth every 12 (twelve) hours. (Patient not taking: Reported on 02/24/2020) 4 tablet 0  . dexamethasone (DECADRON) 2 MG tablet Take 1 tablet (2 mg total) by mouth every 8 (eight) hours. (Patient not taking: Reported on 03/01/2020) 6 tablet 0  . HYDROcodone-acetaminophen (NORCO/VICODIN) 5-325 MG tablet Take 1 tablet by mouth every 4 (four) hours as needed for moderate pain. (Patient not taking: Reported on 02/24/2020) 20 tablet 0  . levETIRAcetam (KEPPRA) 500 MG tablet Take 1 tablet (500 mg total) by mouth 2 (two) times daily. 60 tablet 3  . Multiple Vitamin (MULTIVITAMIN ADULT PO) Take 1 tablet by mouth daily.      No current facility-administered medications on file prior to visit.    Allergies: No Known Allergies Past Medical History:  Past Medical History:  Diagnosis Date  . Cervical spondylolysis 12/15/2019   Skeleton: Mild cervical spondylosis C6-7. Incidental find.   . Left ankle sprain 10/07/2011  . [place]    [place]   . Tick bite 02/2020   Past Surgical History:  Past Surgical History:  Procedure Laterality Date  . APPLICATION OF CRANIAL NAVIGATION Left 02/16/2020   Procedure: APPLICATION OF CRANIAL NAVIGATION;  Surgeon: TVallarie Mare MD;  [facility]  Service: Neurosurgery;  Laterality: Left;  . CRANIOTOMY Left 02/16/2020   Procedure: LEFT FRONTAL CRANIOTOMY FOR BRAIN TUMOR;  Surgeon: TVallarie Mare MD;  [facility]  Service: Neurosurgery;  Laterality: Left;  . FOOT SURGERY  Left    bone spur - local anesthesia  . TONSILECTOMY/ADENOIDECTOMY WITH MYRINGOTOMY     tonsils and adenoids out only  . WISDOM TOOTH EXTRACTION     Social History:  Social History   Socioeconomic History  . Marital status: Married    Spouse name: Not on file  . Number of children: 3  . Years of education: Not on file  . Highest education level: Not on file  Occupational History  .  Not on file  Tobacco Use  . Smoking status: Former Smoker    Years: 2.00    Types: Cigarettes    Quit date: 11/05/1979    Years since quitting: 40.3  . Smokeless tobacco: Never Used  . Tobacco comment: 1/2 pack a week  Substance and Sexual Activity  . Alcohol use: Yes    Alcohol/week: 0.0 standard drinks    Comment: 1 glass of wine weekly  . Drug use: No  . Sexual activity: Yes    Birth control/protection: Surgical  Other Topics Concern  . Not on file  Social History Narrative  . Not on file   Social Determinants of Health   Financial Resource Strain:   . Difficulty of Paying Living Expenses:   Food Insecurity:   . Worried About Charity fundraiser in the Last Year:   . Arboriculturist in the Last Year:   Transportation Needs:   . Film/video editor (Medical):   Marland Kitchen Lack of Transportation (Non-Medical):   Physical Activity:   . Days of Exercise per Week:   . Minutes of Exercise per Session:   Stress:   . Feeling of Stress :   Social Connections:   . Frequency of Communication with Friends and Family:   . Frequency of Social Gatherings with Friends and Family:   . Attends Religious Services:   . Active Member of Clubs or Organizations:   . Attends Archivist Meetings:   Marland Kitchen Marital Status:   Intimate Partner Violence:   . Fear of Current or Ex-Partner:   . Emotionally Abused:   Marland Kitchen Physically Abused:   . Sexually Abused:    Family History:  Family History  Problem Relation Age of Onset  . Hypertension Mother   . Stroke Mother   . Diabetes Mother   . Cancer Father 57       colon  . Hypertension Sister   . Colon polyps Sister   . Heart disease Brother 1  . Diabetes Maternal Uncle   . Diabetes Maternal Uncle     Review of Systems: Constitutional: Doesn't report fevers, chills or abnormal weight loss Eyes: Doesn't report blurriness of vision Ears, nose, mouth, throat, and face: Doesn't report sore throat Respiratory: Doesn't report cough, dyspnea  or wheezes Cardiovascular: Doesn't report palpitation, chest discomfort  Gastrointestinal:  Doesn't report nausea, constipation, diarrhea GU: Doesn't report incontinence Skin: Doesn't report skin rashes Neurological: Per HPI Musculoskeletal: Doesn't report joint pain Behavioral/Psych: Doesn't report anxiety  Physical Exam: Vitals:   03/02/20 1100  BP: (!) 142/88  Pulse: 61  Resp: 18  Temp: 98.7 F (37.1 C)  SpO2: 100%   KPS: 80. General: Alert, cooperative, pleasant, in no acute distress Head: Normal EENT: No conjunctival injection or scleral icterus.  Lungs: Resp effort normal Cardiac: Regular rate Abdomen: Non-distended abdomen Skin: No rashes cyanosis or petechiae. Extremities: No clubbing or edema  Neurologic Exam: Mental Status: Awake, alert, attentive to examiner. Oriented to self and environment. Language is fluent with  intact comprehension.  Occasional interruptions in fluency.  Cranial Nerves: Visual acuity is grossly normal. Visual fields are full. Extra-ocular movements intact. No ptosis. Face is symmetric Motor: Tone and bulk are normal. Power is full in both arms and legs. Noted dyspraxia of right hand. Reflexes are symmetric, no pathologic reflexes present.  Sensory: Intact to light touch Gait: Normal.   Labs: I have reviewed the data as listed    Component Value Date/Time   NA 142 02/17/2020 0136   K 3.9 02/17/2020 0136   CL 109 02/17/2020 0136   CO2 24 02/17/2020 0136   GLUCOSE 142 (H) 02/17/2020 0136   GLUCOSE 85 10/13/2006 1158   BUN 6 02/17/2020 0136   CREATININE 0.78 02/17/2020 0136   CALCIUM 8.8 (L) 02/17/2020 0136   PROT 6.9 12/21/2019 1134   ALBUMIN 4.5 12/21/2019 1134   AST 16 12/21/2019 1134   ALT 14 12/21/2019 1134   ALKPHOS 55 12/21/2019 1134   BILITOT 0.9 12/21/2019 1134   GFRNONAA >60 02/17/2020 0136   GFRAA >60 02/17/2020 0136   Lab Results  Component Value Date   WBC 11.1 (H) 02/17/2020   NEUTROABS 10.4 (H) 02/17/2020   HGB  13.9 02/17/2020   HCT 40.6 02/17/2020   MCV 91.9 02/17/2020   PLT 225 02/17/2020    Imaging:  MR BRAIN W WO CONTRAST  Result Date: 02/17/2020 CLINICAL DATA:  Brain/CNS neoplasm, assess treatment response status post craniotomy for brain mass. EXAM: MRI HEAD WITHOUT AND WITH CONTRAST TECHNIQUE: Multiplanar, multiecho pulse sequences of the brain and surrounding structures were obtained without and with intravenous contrast. CONTRAST:  7.10m GADAVIST GADOBUTROL 1 MMOL/ML IV SOLN COMPARISON:  Brain MRI 02/15/2020 FINDINGS: Brain: Postoperative changes from interval left parietal craniotomy for resection of a posterior paramedian left frontal lobe mass. There is a small amount of left frontal pneumocephalus. There is a small amount of expected postoperative blood products within the resection cavity and expected mild curvilinear enhancement along the resection cavity margins. There is mild parenchymal T2/FLAIR hyperintensity surrounding the resection cavity. This may reflect postoperative edema. A small amount of residual tumor cannot be excluded, particularly along the left anterolateral aspect of the resection cavity (series 6, image 23). There is no evidence of acute infarct. No midline shift. There is a trace extra-axial fluid collection deep to the cranioplasty. Stable, mild chronic small vessel ischemic disease. Vascular: Flow voids maintained within the proximal large arterial vessels. Skull and upper cervical spine: Left parietal craniotomy no focal marrow lesion. Sinuses/Orbits: Visualized orbits demonstrate no acute abnormality. Trace ethmoid sinus mucosal thickening. No significant mastoid effusion. IMPRESSION: Postoperative changes from interval resection of a posterior paramedian left frontal lobe mass. There is a small amount of parenchyma T2 hyperintensity surrounding the resection cavity which may reflect postoperative edema. A small amount of residual tumor cannot be excluded, particularly  along the left anterolateral aspect of the resection cavity. Attention is recommended on follow-up. Electronically Signed   By: KKellie SimmeringDO   On: 02/17/2020 14:18   MR BRAIN W WO CONTRAST  Result Date: 02/15/2020 CLINICAL DATA:  Brain tumor, glioma. EXAM: MRI HEAD WITHOUT AND WITH CONTRAST TECHNIQUE: Multiplanar, multiecho pulse sequences of the brain and surrounding structures were obtained without and with intravenous contrast. CONTRAST:  710mGADAVIST GADOBUTROL 1 MMOL/ML IV SOLN COMPARISON:  Prior brain MRI examinations 01/18/2020 and earlier FINDINGS: Brain: Again demonstrated is a T2/FLAIR hyperintense parenchymal mass within the paramedian posterior left frontal lobe. The main component of the mass has  slightly increased in size since 01/18/2020, now measuring 3.0 x 2.6 x 2.7 cm (AP x TV x CC) (previously 3.0 x 2.6 x 2.5 cm). Additionally, the associated nodular enhancing focus has increased in size, now measuring 13 x 10 x 17 mm (AP x TV x CC) (previously 10 x 8 x 15 mm, remeasured on prior). As before, the mass demonstrates corresponding restricted diffusion. Stable mild chronic small vessel ischemic disease within the cerebral white matter. No evidence of acute infarct. No midline shift or extra-axial fluid collection. No chronic intracranial blood products. Vascular: Flow voids maintained within the proximal large arterial vessels. Skull and upper cervical spine: No focal marrow lesion. Sinuses/Orbits: Visualized orbits demonstrate no acute abnormality. Mild ethmoid sinus mucosal thickening. No significant mastoid effusion. IMPRESSION: Continued slight progressive change of a parenchymal mass within the paramedian posterior left frontal lobe as compared to MRI 01/18/2019. There has been a slight interval increase in size of the overall mass and of an associated nodular enhancing component. 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 Electronically Signed   By: Kellie Simmering DO   On:  02/15/2020 16:46     Assessment/Plan GBM (glioblastoma multiforme) ([place]) [C71.9]   Hannah Hannah [name] is clinically stable Hannah [name].  Hannah [name] we further discussed her pathology, post-op imaging, clinical features, genomic data, and overall prognosis.  We continue to be encouraged regarding her clinical status, resection quality, and tumor size/location.   We continue to recommend upfront standard of care therapy for glioblastoma, including IMRT and concurrent Temodar.   She is still unsure regarding what role radiation and chemo will have in her care moving forward.  We will follow up with again by phone in 1 week after additional consideration given Hannah [name]'s discussion.  Keppra should be continued at 584m BID.  Will follow up next week.  All questions were answered. The patient knows to call the clinic with any problems, questions or concerns. No barriers to learning were detected.  The total time spent in the encounter was 30 minutes and more than 50% was on counseling and review of test results   ZVentura Sellers MD Medical Director of Neuro-Oncology [facility] [place]04/29/21 11:00 AM

## 2020-03-03 ENCOUNTER — Ambulatory Visit: Payer: No Typology Code available for payment source | Admitting: Physical Therapy

## 2020-03-03 ENCOUNTER — Encounter: Payer: Self-pay | Admitting: Occupational Therapy

## 2020-03-03 ENCOUNTER — Telehealth: Payer: Self-pay | Admitting: Internal Medicine

## 2020-03-03 ENCOUNTER — Ambulatory Visit: Payer: No Typology Code available for payment source | Admitting: Occupational Therapy

## 2020-03-03 ENCOUNTER — Encounter (HOSPITAL_COMMUNITY): Payer: Self-pay | Admitting: Internal Medicine

## 2020-03-03 DIAGNOSIS — R2681 Unsteadiness on feet: Secondary | ICD-10-CM

## 2020-03-03 DIAGNOSIS — R278 Other lack of coordination: Secondary | ICD-10-CM

## 2020-03-03 DIAGNOSIS — C711 Malignant neoplasm of frontal lobe: Secondary | ICD-10-CM | POA: Diagnosis not present

## 2020-03-03 DIAGNOSIS — M6281 Muscle weakness (generalized): Secondary | ICD-10-CM

## 2020-03-03 NOTE — Telephone Encounter (Signed)
Scheduled appt per 4/29 los.  Spoke with pt and she is aware of the scheduled appt date and time.

## 2020-03-03 NOTE — Therapy (Signed)
[facility] [address] [place], [place], [address] Phone: [phone]   Fax:  [phone]  Occupational Therapy Evaluation  Patient Details  Name: Hannah [name] MRN: [id number] Date of Birth: [birth year] Referring Provider (OT): Dr. Dorcas Carrow. Marcello Moores   Encounter Date: 03/03/2020  OT End of Session - 03/03/20 1344    Visit Number  1    Number of Visits  17    Date for OT Re-Evaluation  05/02/20    Authorization Type  [employer] [facility] (?primary):  $40 copay each discipline, auth required after 12th visit.  [employer] 2021: $25 copay (1 per day), 20 visit limit OT (each), no auth required    Authorization - Number of Visits  1    Progress Note Due on Visit  20    OT Start Time  1233    OT Stop Time  1321    OT Time Calculation (min)  48 min    Activity Tolerance  Patient tolerated treatment well    Behavior During Therapy  WFL for tasks assessed/performed       Past Medical History:  Diagnosis Date  . Cervical spondylolysis 12/15/2019   Skeleton: Mild cervical spondylosis C6-7. Incidental find.   . Left ankle sprain 10/07/2011  . Seizure ([place])    partial   . Tick bite 02/2020    Past Surgical History:  Procedure Laterality Date  . APPLICATION OF CRANIAL NAVIGATION Left 02/16/2020   Procedure: APPLICATION OF CRANIAL NAVIGATION;  Surgeon: Vallarie Mare, MD;  Location: [facility];  Service: Neurosurgery;  Laterality: Left;  . CRANIOTOMY Left 02/16/2020   Procedure: LEFT FRONTAL CRANIOTOMY FOR BRAIN TUMOR;  Surgeon: Vallarie Mare, MD;  Location: [facility];  Service: Neurosurgery;  Laterality: Left;  . FOOT SURGERY Left    bone spur - local anesthesia  . TONSILECTOMY/ADENOIDECTOMY WITH MYRINGOTOMY     tonsils and adenoids out only  . WISDOM TOOTH EXTRACTION      There were no vitals filed for this visit.  Subjective Assessment - 03/03/20 1236    Subjective   see pt goals    Pertinent History  glioblastoma s/p  craniotomy for resection 02/16/20, hx of seizure    Patient Stated Goals  to be able to type with R hand to go back to work, return to playing tennis, be able to use RUE to push up from a seated position (beach chair), kayak    Currently in Pain?  No/denies        [facility] OT Assessment - 03/03/20 0001      Assessment   Medical Diagnosis  brain tumor s/p craniotomy for resection of R frontal brain mass    Referring Provider (OT)  Dr. Dorcas Carrow. 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 [facility]    typing and talking on the phone   Leisure  play tennis, bike, run, hike, camping, playing board/card games (1x/wk), kayak      ADL   Eating/Feeding  Needs assist with cutting food   approx 50% with RUE, assist for opening containers/packages   Grooming  --   can brush, but not style/braid hair, mod I brushing teeth   Grooming Patient Percentage  --   able to put on make-up on with R hand   Upper Body Bathing  Modified independent    Lower Body Bathing  Modified independent    Upper Body Dressing  --   mod I   Lower Body Dressing  Modified independent    Toilet Transfer  Modified independent    Toileting - Clothing Manipulation  Modified independent    Toileting -  Hygiene  Modified Independent    Tub/Shower Transfer  Modified independent    Tub/Shower Transfer Equipment  Walk in shower   standing to bathe   ADL comments  able to don/doff contacts independently, can't  shuffle cards, difficulty typing (typing test:  22wpm with 100% accuracy)      IADL   Prior Level of Function Shopping  independent    Shopping  Needs to be accompanied on any shopping trip   dizziness with environmental scanning   Prior Level of Function Light Housekeeping  independent    Light Housekeeping  Performs light daily tasks such as dishwashing, bed making   dishes, laundry, make bed w/ supervision/A, sweep/mop/vaccum   Prior Level of Function Meal Prep  independent prior, now has to switch hands with fatigue when chopping/stirring, difficulty opening containers/packages, uses BUEs for pots/pans     Prior Level of Function Community Mobility  independent    Community Mobility  Relies on family or friends for transportation   due to seizure restriction   Medication Management  Is responsible for taking medication in correct dosages at correct time   using pillbox   Prior Level of Function Financial Management  husband performed prior      Mobility   Mobility Status  Independent    Mobility Status Comments  however, someone walks beside her due to dizziness with head turns      Written Expression   Dominant Hand  Right    Handwriting  Increased time;100% legible   with pain in R hand     Vision - History   Baseline Vision  Wears contact    Additional Comments  able to don/doff contacts, mild blurriness both eyes       Cognition   Overall Cognitive Status  Within Functional Limits for tasks assessed   pt denies deficits except word-finding and fatigue     Sensation   Light Touch  Impaired Detail    Light Touch Impaired Details  Impaired RLE   top of foot     Coordination   9 Hole Peg Test  Right;Left    Right 9 Hole Peg Test  33.87    Left 9 Hole Peg Test  25.53      ROM / Strength   AROM / PROM / Strength  AROM;Strength      AROM   Overall AROM   Within functional limits for tasks performed    Overall AROM Comments  BUEs      Strength   Overall Strength   Deficits    Overall Strength Comments  Proximal LUE strength 5/5,  RUE  shoulder strength grossly 4 to 4+/5 (biceps/triceps 5/5)      Hand Function   Right Hand Grip (lbs)  30.4    Left Hand Grip (lbs)  60.1                      OT Education - 03/03/20 1341    Education Details  OT eval results/POC; cautioned pt to avoid over-fatigue (physically or cognitively); recommended flipping meat in pan with tongs vs. spatula for incr control    Person(s) Educated  Patient    Methods  Explanation    Comprehension  Verbalized understanding       OT Short Term Goals - 03/03/20 1413      OT SHORT TERM GOAL #1   Title  Pt will be independent with initial HEP.--check STGs 04/02/20    Time  4    Period  Weeks    Status  New      OT SHORT TERM GOAL #2   Title  Pt will be able to eat with RUE 100% of the time including cutting meat.    Baseline  4    Period  Weeks    Status  New      OT SHORT TERM GOAL #3   Title  Pt will improve R hand coordination for ADLs as shown by improving time on 9-hole peg test by at least 8sec.    Baseline  33.8sec    Time  4    Period  Weeks    Status  New      OT SHORT TERM GOAL #4   Title  Pt will demo at least 38lbs R grip strength to assist in opening containers.    Baseline  30.4lbs    Time  4    Period  Weeks    Status  New      OT SHORT TERM GOAL #5   Title  Pt will be able to type at least 35wpm in prep for return to work.    Baseline  22wpm    Time  4    Period  Weeks    Status  New      OT SHORT TERM GOAL #6   Title  Pt will be able to shuffle cards for leisure activities.    Time  4    Period  Weeks    Status  New        OT Long Term Goals - 03/03/20 1417      OT LONG TERM GOAL #1   Title  Pt will be independent with updated HEP.--check LTGs 05/03/20    Time  8    Period  Weeks    Status  New      OT LONG TERM GOAL #2   Title  Pt will demo at least 50lbs R grip strength for leisure tasks.    Baseline  30.4lbs     Time  8    Period  Weeks    Status  New      OT LONG TERM GOAL #3   Title  Pt will be able to type at least 45 wpm in prep for return to work.    Time  8    Period  Weeks    Status  New      OT LONG TERM GOAL #4   Title  Pt will report ability to use RUE as dominat UE for IADL tasks at least 95% of the  time.    Baseline  8    Period  Weeks    Status  New      OT LONG TERM GOAL #5   Title  Pt will be able to use RUE for simulated leisure tasks for at least 25 min without significant rest break.    Baseline  8    Period  Weeks    Status  New      OT LONG TERM GOAL #6   Title  Pt will be able to style hair (braid, pony tail) mod I.    Time  8    Period  Weeks    Status  New      OT LONG TERM GOAL #7   Title  Pt will be able to write at least 3 sentences within 58min with no reports of pain in prep to return to work tasks.    Time  8    Period  Weeks    Status  New            Plan - 03/03/20 1356    Clinical Impression Statement  Pt is a 59 y.o. female s/p craniotomy for resection of R frontal brain mass 02/16/20 (glioblastoma) and seizure.  Pt was hospitalized 02/16/20-02/20/20.  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  No significant prior PMH.   Pt presents today with decr RUE coordination and strength, decr dominant RUE functional use, decr balance, and decr activity tolerance.  Pt would benefit from occupational therapy to address these deficits for improved dominant RUE functional use, to return to previous ADLs/IADLs, work and leisure tasks.    OT Occupational Profile and History  Detailed Assessment- Review of Records and additional review of physical, cognitive, psychosocial history related to current functional performance    Occupational performance deficits (Please refer to evaluation for details):  ADL's;IADL's    Body Structure / Function / Physical Skills   ADL;Dexterity;IADL;Balance;Strength;FMC;Coordination;UE functional use;Decreased knowledge of use of DME;Pain    Rehab Potential  Good    Clinical Decision Making  Limited treatment options, no task modification necessary    Comorbidities Affecting Occupational Performance:  None    Modification or Assistance to Complete Evaluation   No modification of tasks or assist necessary to complete eval    OT Frequency  2x / week    OT Duration  8 weeks   +eval   OT Treatment/Interventions  Self-care/ADL training;Moist Heat;Aquatic Therapy;DME and/or AE instruction;Therapeutic activities;Therapeutic exercise;Fluidtherapy;Visual/perceptual remediation/compensation;Passive range of motion;Neuromuscular education;Cryotherapy;Energy conservation;Manual Therapy;Patient/family education    Plan  initate HEP for coordination and grip strength    Consulted and Agree with Plan of Care  Patient       Patient will benefit from skilled therapeutic intervention in order to improve the following deficits and impairments:   Body Structure / Function / Physical Skills: ADL, Dexterity, IADL, Balance, Strength, FMC, Coordination, UE functional use, Decreased knowledge of use of DME, Pain       Visit Diagnosis: Other lack of coordination  Muscle weakness (generalized)  Unsteadiness on feet    Problem List Patient Active Problem List   Diagnosis Date Noted  . GBM (glioblastoma multiforme) ([place])   . Steroid-induced hyperglycemia   . Seizures ([place])   . Leucocytosis   . Bradycardia   . Status post craniotomy 02/16/2020  . Tick bite 02/08/2020  . History of recent hospitalization 12/21/2019  . Seizure disorder ([place])  12/21/2019  . Hypokalemia 12/21/2019  . Long-term use of high-risk medication 12/21/2019  . Frontal mass of brain 12/16/2019  . Family history of colon cancer - father 49 05/26/2015  . Colon cancer screening 03/10/2015  . Vitamin D deficiency 03/10/2015    [facility] 03/03/2020, 2:27  PM  [facility] [address] [place], [place], [address] Phone: [phone]   Fax:  [phone]  Name: Hannah [name] MRN: [id number] Date of Birth: November 14, 1960   Vianne Bulls, OTR/L [facility] [address] [place], [place]  [address] [phone] phone [phone] 03/03/20 2:27 PM

## 2020-03-06 ENCOUNTER — Ambulatory Visit: Payer: No Typology Code available for payment source | Attending: Neurosurgery

## 2020-03-06 ENCOUNTER — Other Ambulatory Visit: Payer: Self-pay

## 2020-03-06 DIAGNOSIS — R41841 Cognitive communication deficit: Secondary | ICD-10-CM | POA: Diagnosis present

## 2020-03-06 DIAGNOSIS — R278 Other lack of coordination: Secondary | ICD-10-CM | POA: Insufficient documentation

## 2020-03-06 DIAGNOSIS — R2681 Unsteadiness on feet: Secondary | ICD-10-CM | POA: Diagnosis present

## 2020-03-06 DIAGNOSIS — M6281 Muscle weakness (generalized): Secondary | ICD-10-CM | POA: Insufficient documentation

## 2020-03-06 DIAGNOSIS — R4701 Aphasia: Secondary | ICD-10-CM | POA: Diagnosis not present

## 2020-03-06 DIAGNOSIS — R42 Dizziness and giddiness: Secondary | ICD-10-CM | POA: Diagnosis present

## 2020-03-06 DIAGNOSIS — R2689 Other abnormalities of gait and mobility: Secondary | ICD-10-CM | POA: Diagnosis present

## 2020-03-06 NOTE — Patient Instructions (Signed)
   Constant Therapy - app on App Store and download - for attention skills    Tips to help facilitate better attention, concentration, focus     Do harder, longer tasks when you are most alert/awake   Break down larger tasks into small parts   Limit distractions of TV, radio, conversation, e mails/texts, appliance noise, etc - if a job is important, do it in a quiet room   Be aware of how you are functioning in high stimulation environments such as large stores, parties, restaurants - any place with lots of lights, noise, signs etc   Group conversations may be more difficult to process than one on one conversations   Give yourself extra time to process conversation, reading materials, directions or information from your healthcare providers   Organization is key - clutters of laundry, mail, paperwork, dirty dishes - all make it more difficult to concentrate   Before you start a task, have all the needed supplies, directions, recipes ready and organized. This way you don't have to go looking for something in the middle of a task and become distracted.    Be aware of fatigue - take rests or breaks when needed to re-group and re-focus

## 2020-03-06 NOTE — Therapy (Signed)
[facility] [address] [place], [place], [address] Phone: [phone]   Fax:  [phone]  Speech Language Pathology Treatment  Patient Details  Name: Hannah [name] MRN: [id number] Date of Birth: 1961-08-06 Referring Provider (SLP): Duffy Rhody, MD   Encounter Date: 03/06/2020  End of Session - 03/06/20 1633    Visit Number  3    Number of Visits  5    Date for SLP Re-Evaluation  04/25/20    SLP Start Time  1532    SLP Stop Time   [id number]    SLP Time Calculation (min)  43 min    Activity Tolerance  Patient tolerated treatment well       Past Medical History:  Diagnosis Date  . Cervical spondylolysis 12/15/2019   Skeleton: Mild cervical spondylosis C6-7. Incidental find.   . Left ankle sprain 10/07/2011  . Seizure ([place])    partial   . Tick bite 02/2020    Past Surgical History:  Procedure Laterality Date  . APPLICATION OF CRANIAL NAVIGATION Left 02/16/2020   Procedure: APPLICATION OF CRANIAL NAVIGATION;  Surgeon: Vallarie Mare, MD;  Location: [facility];  Service: Neurosurgery;  Laterality: Left;  . CRANIOTOMY Left 02/16/2020   Procedure: LEFT FRONTAL CRANIOTOMY FOR BRAIN TUMOR;  Surgeon: Vallarie Mare, MD;  Location: [facility];  Service: Neurosurgery;  Laterality: Left;  . FOOT SURGERY Left    bone spur - local anesthesia  . TONSILECTOMY/ADENOIDECTOMY WITH MYRINGOTOMY     tonsils and adenoids out only  . WISDOM TOOTH EXTRACTION      There were no vitals filed for this visit.  Subjective Assessment - 03/06/20 1541    Subjective  "I'm having trouble focusing when there's backround noise."    Patient is accompained by:  Family member   daughter [name]   Currently in Pain?  No/denies            ADULT SLP TREATMENT - 03/06/20 0001      General Information   Behavior/Cognition  Alert;Cooperative;Pleasant mood      Treatment Provided   Treatment provided  Cognitive-Linquistic       Cognitive-Linquistic Treatment   Treatment focused on  Cognition;Aphasia    Skilled Treatment  Arrives with daughter [name]. SLP notes pt fluidity of thought is slower (and pt reports it is slower than HER WNL) but not outside WNL for general population. No anomia today. SLP addressed some attention compensations and provided handout, given pts "S" statement. SLP assisted pt problem solve her back to work, given possible scenarios/deficits during treatment with symptoms of "mild fatigue" - pt goal to return to work is April 04, 2020. Pt demo'd WNL thought processing and organization, and anticiptory awareness during this discussion.        Assessment / Recommendations / Plan   Plan  Continue with current plan of care      Progression Toward Goals   Progression toward goals  Progressing toward goals       SLP Education - 03/06/20 1633    Education Details  constant therapy, compensations for increased attention    Person(s) Educated  Patient    Methods  Explanation;Handout    Comprehension  Verbalized understanding         SLP Long Term Goals - 03/06/20 1639      SLP LONG TERM GOAL #1   Title  Patient will demonstrate understanding of appropriate cognitive-linguistic activities for home practice.    Baseline  03-06-20  Status  Achieved      SLP LONG TERM GOAL #2   Title  Patient will use compensations for anomia in 15 minutes mod complex conversation functionally.    Time  4    Period  Weeks    Status  On-going      SLP LONG TERM GOAL #3   Title  Pt will report use of aids/strategies to assist with multitasking, schedule and information management.    Time  4    Period  Weeks    Status  On-going       Plan - 03/06/20 1636    Clinical Impression Statement  [name] presents with overall mild higher level cognitive and language deficits s/p resection of left frontal glioblastoma. She cont to report improvements in language production since discharge from hospital.  Discussion about treatment options Thursday 03-09-20 with Dr. Mickeal Skinner. Patient enjoys being physically active in the outdoors; she also enjoys games like majong, sequence, and other card games. I recommend brief course of ST to continue training in cognitive/language home program and compensatory strategies to improve communication and cognitive skills.    Speech Therapy Frequency  1x /week    Duration  4 weeks    Treatment/Interventions  Language facilitation;Cueing hierarchy;SLP instruction and feedback;Compensatory techniques;Cognitive reorganization;Functional tasks;Compensatory strategies;Multimodal communcation approach;Internal/external aids;Patient/family education    Potential to Achieve Goals  Good    Consulted and Agree with Plan of Care  Patient;Family member/caregiver    Family Member Consulted  husband       Patient will benefit from skilled therapeutic intervention in order to improve the following deficits and impairments:   Aphasia  Cognitive communication deficit    Problem List Patient Active Problem List   Diagnosis Date Noted  . GBM (glioblastoma multiforme) ([facility])   . Steroid-induced hyperglycemia   . Seizures ([place])   . Leucocytosis   . Bradycardia   . Status post craniotomy 02/16/2020  . Tick bite 02/08/2020  . History of recent hospitalization 12/21/2019  . Seizure disorder ([place]) 12/21/2019  . Hypokalemia 12/21/2019  . Long-term use of high-risk medication 12/21/2019  . Frontal mass of brain 12/16/2019  . Family history of colon cancer - father 50 05/26/2015  . Colon cancer screening 03/10/2015  . Vitamin D deficiency 03/10/2015    [facility] ,MS, CCC-SLP  03/06/2020, 4:40 PM  [facility] [address] [place], [place], [address] Phone: [phone]   Fax:  [phone]   Name: Hannah [name] MRN: [id number] Date of Birth: 1961/03/20

## 2020-03-08 ENCOUNTER — Encounter: Payer: Self-pay | Admitting: Physical Therapy

## 2020-03-08 ENCOUNTER — Ambulatory Visit: Payer: No Typology Code available for payment source | Admitting: Physical Therapy

## 2020-03-08 ENCOUNTER — Other Ambulatory Visit: Payer: Self-pay

## 2020-03-08 DIAGNOSIS — R2681 Unsteadiness on feet: Secondary | ICD-10-CM

## 2020-03-08 DIAGNOSIS — R2689 Other abnormalities of gait and mobility: Secondary | ICD-10-CM

## 2020-03-08 DIAGNOSIS — M6281 Muscle weakness (generalized): Secondary | ICD-10-CM

## 2020-03-08 DIAGNOSIS — R4701 Aphasia: Secondary | ICD-10-CM | POA: Diagnosis not present

## 2020-03-08 DIAGNOSIS — R42 Dizziness and giddiness: Secondary | ICD-10-CM

## 2020-03-08 NOTE — Patient Instructions (Addendum)
Gaze Stabilization: Sitting    Keeping eyes on target on wall 3 feet away, and move head side to side for _30_ seconds. Repeat while moving head up and down for __30__ seconds. Do __2-3__ sessions per day.  Copyright  VHI. All rights reserved.   Gaze Stabilization: Tip Card  1.Target must remain in focus, not blurry, and appear stationary while head is in motion. 2.Perform exercises with small head movements (45 to either side of midline). 3.Increase speed of head motion so long as target is in focus. 4.If you wear eyeglasses, be sure you can see target through lens (therapist will give specific instructions for bifocal / progressive lenses). 5.These exercises may provoke dizziness or nausea. Work through these symptoms. If too dizzy, slow head movement slightly. Rest between each exercise. 6.Exercises demand concentration; avoid distractions.     Random Direction Head Motion   Walking on solid surface, have person cue you to move head and eyes to the left and right every __3__ steps.  Then perform down and back with looking up and down every 3 steps.   Repeat sequence __2__ times each per session. Do __2__ sessions per day.   Feet Together, Head Motion - Eyes Closed    With eyes closed and feet together, move head slowly, up and down 2-3 times.  Repeat with head turns 2-3 times. Repeat _2___ times per session. Do __2__ sessions per day.  Copyright  VHI. All rights reserved.

## 2020-03-08 NOTE — Therapy (Signed)
[facility] [address] [place], [place], [address] Phone: [phone]   Fax:  [phone]  Physical Therapy Treatment  Patient Details  Name: Hannah [name] MRN: [id number] Date of Birth: [birth year] Referring Provider (PT): Duffy Rhody, MD   Encounter Date: 03/08/2020  PT End of Session - 03/08/20 1223    Visit Number  3    Number of Visits  21    Date for PT Re-Evaluation  04/25/20    Authorization Type  [name]    PT Start Time  1020    PT Stop Time  1107    PT Time Calculation (min)  47 min    Activity Tolerance  Patient tolerated treatment well   some increase in dizziness with certain tasks   Behavior During Therapy  [facility] for tasks assessed/performed       Past Medical History:  Diagnosis Date  . Cervical spondylolysis 12/15/2019   Skeleton: Mild cervical spondylosis C6-7. Incidental find.   . Left ankle sprain 10/07/2011  . Seizure ([place])    partial   . Tick bite 02/2020    Past Surgical History:  Procedure Laterality Date  . APPLICATION OF CRANIAL NAVIGATION Left 02/16/2020   Procedure: APPLICATION OF CRANIAL NAVIGATION;  Surgeon: Vallarie Mare, MD;  Location: [facility];  Service: Neurosurgery;  Laterality: Left;  . CRANIOTOMY Left 02/16/2020   Procedure: LEFT FRONTAL CRANIOTOMY FOR BRAIN TUMOR;  Surgeon: Vallarie Mare, MD;  Location: [facility];  Service: Neurosurgery;  Laterality: Left;  . FOOT SURGERY Left    bone spur - local anesthesia  . TONSILECTOMY/ADENOIDECTOMY WITH MYRINGOTOMY     tonsils and adenoids out only  . WISDOM TOOTH EXTRACTION      There were no vitals filed for this visit.  Subjective Assessment - 03/08/20 1025    Subjective  Doing well. Used scissors last night with good control.  Has a telemedicine appointment with oncologist to follow up.    Patient is accompained by:   Family member    Pertinent History  No signitifcant history prior to GBM s/p frontal craniotomy 4/14    Limitations  Walking    How long can you walk comfortably?  Can ambulate about 30 mins, is working her way back into activity.  Is going housework, stairs    Patient Stated Goals  "I want to get back to running, playing tennis, hiking, biking.  I want to return to work"    Currently in Pain?  No/denies                        Vestibular Treatment/Exercise - 03/08/20 1029      Vestibular Treatment/Exercise   Vestibular Treatment Provided  Gaze    Gaze Exercises  X1 Viewing Horizontal;X1 Viewing Vertical      X1 Viewing Horizontal   Foot Position  seated    Reps  2    Comments  30 seconds, mild symptoms      X1 Viewing Vertical   Foot Position  seated    Reps  2    Comments  30 seconds, mild symptoms         Balance Exercises - 03/08/20 1042      Balance Exercises: Standing   Standing Eyes Closed  Narrow base of support (BOS);Head turns;Solid surface;3 reps;10 secs  Gait with Head Turns  Forward;Upper extremity support;4 reps   head turns L and R, up and down every 3 steps       PT Education - 03/08/20 1223    Education Details  initiated vestibular and balance HEP    Person(s) Educated  Patient    Methods  Explanation    Comprehension  Verbalized understanding      Gaze Stabilization: Sitting    Keeping eyes on target on wall 3 feet away, and move head side to side for _30_ seconds. Repeat while moving head up and down for __30__ seconds. Do __2-3__ sessions per day.  Copyright  VHI. All rights reserved.   Gaze Stabilization: Tip Card  1.Target must remain in focus, not blurry, and appear stationary while head is in motion. 2.Perform exercises with small head movements (45 to either side of midline). 3.Increase speed of head motion so long as target is in focus. 4.If you wear eyeglasses, be sure you can see target through lens  (therapist will give specific instructions for bifocal / progressive lenses). 5.These exercises may provoke dizziness or nausea. Work through these symptoms. If too dizzy, slow head movement slightly. Rest between each exercise. 6.Exercises demand concentration; avoid distractions.     Random Direction Head Motion   Walking on solid surface, have person cue you to move head and eyes to the left and right every __3__ steps.  Then perform down and back with looking up and down every 3 steps.   Repeat sequence __2__ times each per session. Do __2__ sessions per day.   Feet Together, Head Motion - Eyes Closed    With eyes closed and feet together, move head slowly, up and down 2-3 times.  Repeat with head turns 2-3 times. Repeat _2___ times per session. Do __2__ sessions per day.  Copyright  VHI. All rights reserved.    PT Short Term Goals - 02/25/20 1043      PT SHORT TERM GOAL #1   Title  Pt will be indepenent with initial HEP in order to indicate improved functional mobility and decreased fall risk.  (Target Date: 03/26/20)    Time  4    Period  Weeks    Status  New    Target Date  03/26/20      PT SHORT TERM GOAL #2   Title  Pt will improve 5TSS to </=13 secs without UE support in order to indicate improved functional strength.    Time  4    Period  Weeks    Status  New      PT SHORT TERM GOAL #3   Title  Pt will improve FGA to >/=21/30 in order to indicate decreased fall risk.    Time  4    Period  Weeks    Status  New      PT SHORT TERM GOAL #4   Title  Pt will improve gait speed to >/=4.00 ft/sec in order to indicate more normal gait speed.    Time  4    Period  Weeks    Status  New      PT SHORT TERM GOAL #5   Title  Will participate in formal vestibular assessment and LTGs to be written as appropriate.    Time  4    Period  Weeks    Status  New      Additional Short Term Goals   Additional Short Term Goals  Yes  PT SHORT TERM GOAL #6   Title  Pt  will negotiate up/down 12 steps with single rail at mod I level (reciprocal pattern) in order to negotiate in home safely.    Time  4    Period  Weeks    Status  New        PT Long Term Goals - 03/01/20 1229      PT LONG TERM GOAL #1   Title  Pt will be independent with final HEP in order to indicate decreased fall risk and improved functional mobility (Target Date: 04/25/20)    Time  8    Period  Weeks    Status  New      PT LONG TERM GOAL #2   Title  Pt will perform 5TSS </=11 secs in order to indicate improved functional strength.    Time  8    Period  Weeks    Status  New      PT LONG TERM GOAL #3   Title  Pt will improve FGA to >/=25/30 in order to indicate decreased fall risk.    Time  8    Period  Weeks    Status  New      PT LONG TERM GOAL #4   Title  Pt will ambulate >1000' over varying outdoor surfaces at mod I level while scanning environment without LOB in order to indicate safe community mobility.    Time  8    Period  Weeks    Status  New      PT LONG TERM GOAL #5   Title  Pt will demonstrate ability to begin jogging, hiking, [name]    Time  8    Period  Weeks    Status  New      PT LONG TERM GOAL #6   Title  Pt will negotiate up/down 12 steps without rail in reciprocal pattern while carrying items with BUEs to simulate return to ADLs.    Time  8    Period  Weeks    Status  New      PT LONG TERM GOAL #7   Title  Patient will report no dizziness with bed mobility, quick head and body turns, and bending down to the ground    Time  8    Period  Weeks    Status  New            Plan - 03/08/20 1224    Clinical Impression Statement  Initiated vestibular HEP focusing on use of x1 viewing, walking with head turns and corner balance with eyes closed and head turns to focus on habituation and postural control/postural reactions.  Pt demonstrated strongest symptoms with horizontal  head movements.  Educated pt on using symptom threshold as guide at home.  Will continue to progress towards LTG.    Personal Factors and Comorbidities  Comorbidity 1;Social Background;Profession;Fitness;Transportation    Comorbidities  craniotomy    Examination-Activity Limitations  Caring for Others;Carry;Lift;Locomotion Level;Reach Overhead;Stairs;Stand    Examination-Participation Restrictions  Community Activity;Driving;Yard Work;Meal Prep    Stability/Clinical Decision Making  Evolving/Moderate complexity    Rehab Potential  Excellent    PT Frequency  Other (comment)   then 2x/wk for 4 weeks   PT Duration  Other (comment)   then 2x/wk for 4 weeks   PT Treatment/Interventions  ADLs/Self Care Home Management;Aquatic Therapy;Gait training;Stair training;Functional mobility training;Therapeutic activities;Balance training;Therapeutic  exercise;Neuromuscular re-education;Cognitive remediation;Patient/family education;Passive range of motion;Vestibular    PT Next Visit Plan  How is HEP?  Continued to progress x1 viewing, walking with head turns, corner balance, bending down to ground, endurance training    Consulted and Agree with Plan of Care  Patient;Family member/caregiver    Family Member Consulted  daughter       Patient will benefit from skilled therapeutic intervention in order to improve the following deficits and impairments:  Abnormal gait, Decreased activity tolerance, Decreased balance, Decreased cognition, Decreased coordination, Decreased endurance, Decreased mobility, Decreased strength, Dizziness, Impaired sensation, Impaired UE functional use, Postural dysfunction  Visit Diagnosis: Muscle weakness (generalized)  Unsteadiness on feet  Dizziness and giddiness  Other abnormalities of gait and mobility     Problem List Patient Active Problem List   Diagnosis Date Noted  . GBM (glioblastoma multiforme) ([facility])   . Steroid-induced hyperglycemia   . Seizures ([place])   .  Leucocytosis   . Bradycardia   . Status post craniotomy 02/16/2020  . Tick bite 02/08/2020  . History of recent hospitalization 12/21/2019  . Seizure disorder ([place]) 12/21/2019  . Hypokalemia 12/21/2019  . Long-term use of high-risk medication 12/21/2019  . Frontal mass of brain 12/16/2019  . Family history of colon cancer - father 53 05/26/2015  . Colon cancer screening 03/10/2015  . Vitamin D deficiency 03/10/2015    Rico Junker, PT, DPT 03/08/20    12:28 PM    [facility] [address] [place], [place], [address] Phone: [phone]   Fax:  [phone]  Name: VONA RAFANAN MRN: [id number] Date of Birth: 1961-09-05

## 2020-03-09 ENCOUNTER — Inpatient Hospital Stay: Payer: No Typology Code available for payment source | Attending: Internal Medicine | Admitting: Internal Medicine

## 2020-03-09 ENCOUNTER — Ambulatory Visit: Payer: No Typology Code available for payment source | Admitting: Internal Medicine

## 2020-03-09 ENCOUNTER — Ambulatory Visit: Payer: No Typology Code available for payment source | Admitting: Rehabilitation

## 2020-03-09 DIAGNOSIS — C719 Malignant neoplasm of brain, unspecified: Secondary | ICD-10-CM

## 2020-03-09 DIAGNOSIS — R569 Unspecified convulsions: Secondary | ICD-10-CM | POA: Diagnosis not present

## 2020-03-09 NOTE — Progress Notes (Signed)
I connected with Satonya A Denomme on 03/09/20 at 10:00 AM EDT by telephone visit and verified that I am speaking with the correct person using two identifiers.  I discussed the limitations, risks, security and privacy concerns of performing an evaluation and management service by telemedicine and the availability of in-person appointments. I also discussed with the patient that there may be a patient responsible charge related to this service. The patient expressed understanding and agreed to proceed.  Other persons participating in the visit and their role in the encounter:  n/a  Patient's location:  Home  Provider's location:  Office  Chief Complaint:  GBM (glioblastoma multiforme) ([place])  Seizures ([place])  History of Present Ilness: Breelynn A Scalese describes no new or progressive neurologic deficits.  Today we continued our discussion of goals of care and treatment planning for her brain tumor. Observations: Language and cognition stable Assessment and Plan: GBM (glioblastoma multiforme) ([place])  Seizures ([place])  Ms. Gaultney has opted to defer radiation and/or chemotherapy for now due to toxicity concerns.  We are supportive of her decision but emphasize our clinical recommendation which is full course of 6 weeks IMRT+TMZ. Follow Up Instructions: RTC in 1 month following repeat MRI brain  I discussed the assessment and treatment plan with the patient.  The patient was provided an opportunity to ask questions and all were answered.  The patient agreed with the plan and demonstrated understanding of the instructions.    The patient was advised to call back or seek an in-person evaluation if the symptoms worsen or if the condition fails to improve as anticipated.  I provided 5-10 minutes of non-face-to-face time during this enocunter.  [name], MD   I provided 15 minutes of non face-to-face telephone visit time during this encounter, and > 50% was spent counseling as documented under my  assessment & plan.

## 2020-03-10 ENCOUNTER — Ambulatory Visit: Payer: No Typology Code available for payment source | Admitting: Rehabilitation

## 2020-03-13 ENCOUNTER — Ambulatory Visit: Payer: No Typology Code available for payment source | Admitting: Rehabilitation

## 2020-03-16 ENCOUNTER — Ambulatory Visit: Payer: No Typology Code available for payment source

## 2020-03-17 ENCOUNTER — Telehealth: Payer: Self-pay | Admitting: Medical Oncology

## 2020-03-17 ENCOUNTER — Ambulatory Visit: Payer: No Typology Code available for payment source | Admitting: Rehabilitation

## 2020-03-17 NOTE — Telephone Encounter (Signed)
Faxed  Disability form to [employer] .  Mailed copy to pt and copy placed on Roz's desk

## 2020-03-20 ENCOUNTER — Ambulatory Visit: Payer: No Typology Code available for payment source | Admitting: Rehabilitation

## 2020-03-20 ENCOUNTER — Encounter: Payer: Self-pay | Admitting: Rehabilitation

## 2020-03-20 ENCOUNTER — Other Ambulatory Visit: Payer: Self-pay

## 2020-03-20 DIAGNOSIS — M6281 Muscle weakness (generalized): Secondary | ICD-10-CM

## 2020-03-20 DIAGNOSIS — R4701 Aphasia: Secondary | ICD-10-CM | POA: Diagnosis not present

## 2020-03-20 DIAGNOSIS — R42 Dizziness and giddiness: Secondary | ICD-10-CM

## 2020-03-20 DIAGNOSIS — R2681 Unsteadiness on feet: Secondary | ICD-10-CM

## 2020-03-20 DIAGNOSIS — R2689 Other abnormalities of gait and mobility: Secondary | ICD-10-CM

## 2020-03-20 NOTE — Patient Instructions (Signed)
Gaze Stabilization: Standing Feet Apart    Feet shoulder width apart, keeping eyes on target on wall __4-5__ feet away, tilt head down 15-30 and move head side to side for _10-15 secs___ seconds. Repeat while moving head up and down for _10-15 secs___ seconds.   Do __3-4__ sessions per day.   Copyright  VHI. All rights reserved.   Gaze Stabilization: Tip Card  1.Target must remain in focus, not blurry, and appear stationary while head is in motion. 2.Perform exercises with small head movements (45 to either side of midline). 3.Increase speed of head motion so long as target is in focus. 4.If you wear eyeglasses, be sure you can see target through lens (therapist will give specific instructions for bifocal / progressive lenses). 5.These exercises may provoke dizziness or nausea. Work through these symptoms. If too dizzy, slow head movement slightly. Rest between each exercise. 6.Exercises demand concentration; avoid distractions.     Random Direction Head Motion   Walking on solid surface, have person cue you to move head and eyes to the left and right every __3__ steps.  Then perform down and back with looking up and down every 3 steps.   Repeat sequence __2__ times each per session. Do __3__ sessions per day.  To progress this, do fuller range head motions.     Feet Together, Head Motion - Eyes Closed    With eyes closed and feet together, move head slowly, up and down 2-3 times.  Repeat with head turns 2-3 times. Repeat _2___ times per session. Do __3-4__ sessions per day.  Progress this exercise by speeding up head movements slightly. Still use plain background for target.   Reaching / Placing Object, Diagonal Pattern    With feet apart, use two hands to hold ball and move down on right side, then move upward overhead to the left, moving eyes and head with ball. Repeat __5-6__ times per session. Do __2-3__ sessions per day. Do this exercise on yoga mat for balance  challenge.  Repeat sequence to opposite side 5-6 reps.    Copyright  VHI. All rights reserved.

## 2020-03-20 NOTE — Therapy (Signed)
[facility] [address] [place], [place], [address] Phone: [phone]   Fax:  [phone]  Physical Therapy Treatment  Patient Details  Name: Hannah [name] MRN: [id number] Date of Birth: 31-Dec-1960 Referring Provider (PT): Duffy Rhody, MD   Encounter Date: 03/20/2020  PT End of Session - 03/20/20 1139    Visit Number  4    Number of Visits  21    Date for PT Re-Evaluation  04/25/20    Authorization Type  Hannah [name] focus is primary and [employer] is secondary - $40 copay and no VL but requires auth after visit 12    PT Start Time  0845    PT Stop Time  0933    PT Time Calculation (min)  48 min    Activity Tolerance  Patient tolerated treatment well   some increase in dizziness with certain tasks   Behavior During Therapy  [facility] for tasks assessed/performed       Past Medical History:  Diagnosis Date  . Cervical spondylolysis 12/15/2019   Skeleton: Mild cervical spondylosis C6-7. Incidental find.   . Left ankle sprain 10/07/2011  . Seizure ([place])    partial   . Tick bite 02/2020    Past Surgical History:  Procedure Laterality Date  . APPLICATION OF CRANIAL NAVIGATION Left 02/16/2020   Procedure: APPLICATION OF CRANIAL NAVIGATION;  Surgeon: Vallarie Mare, MD;  Location: [facility];  Service: Neurosurgery;  Laterality: Left;  . CRANIOTOMY Left 02/16/2020   Procedure: LEFT FRONTAL CRANIOTOMY FOR BRAIN TUMOR;  Surgeon: Vallarie Mare, MD;  Location: [facility];  Service: Neurosurgery;  Laterality: Left;  . FOOT SURGERY Left    bone spur - local anesthesia  . TONSILECTOMY/ADENOIDECTOMY WITH MYRINGOTOMY     tonsils and adenoids out only  . WISDOM TOOTH EXTRACTION      There were no vitals filed for this visit.  Subjective Assessment - 03/20/20 0848    Subjective  Doing very well, main concern is speech at this point.    Pertinent History  No signitifcant history prior to GBM s/p frontal craniotomy 4/14    Patient  Stated Goals  "I want to get back to running, playing tennis, hiking, biking.  I want to return to work"    Currently in Pain?  No/denies                        [facility] [place] Adult PT Treatment/Exercise - 03/20/20 0900      Ambulation/Gait   Ambulation/Gait  Yes    Ambulation/Gait Assistance  6: Modified independent (Device/Increase time)    Ambulation/Gait Assistance Details  Mod I during session with normal ambulation, S for higher level challenges.     Ambulation Distance (Feet)  500 Feet    Assistive device  None    Gait Pattern  Step-through pattern;Decreased stride length;Trunk flexed    Ambulation Surface  Level;Indoor      High Level Balance   High Level Balance Comments  High level balance with emphasis on coordination and R foot push off with skipping x 50' x 2 reps at S level.        Self-Care   Self-Care  Other Self-Care Comments    Other Self-Care Comments   Continued to educate on vestibular system deficits and how we are working in therapy to provoke this system to a certain extent, but also making sure not to overload/over stimulate to aide best in recovery.  Pt reports being "nervous" to move head at times, but will start to incorporate this more. She has started hiking some and did an online exercise class.  Encouraged her to continue with this as able, modifying tasks as needed or resting when needed.  Discussed RPE again pushing into moderate level of fatigue then resting.  Pt verbalized understanding.  Pt wanting to return to tennis, therefore discusssed her bringing her racquet to next session and vollying against the wall during session to practice quick side to side and front/side motions.  Pt verbalized understanding.       Neuro Re-ed    Neuro Re-ed Details   Reviewed HEP from last session (went ahead and progressed sitting gaze stabilization to standing due to marked progress) standing with feet apart approx 4-5' from target.  Pt able to complete about 10-15  secs before dizziness elevates 3-4 points from baseline.  Educated on only going to that level and then stopping as not to over stimulate.  Performed both horizontal and vertical gaze x 10-15 secs each.  Encouraged her to perform 3-4 times per day.  Maintained standing in corner with feet together EC with head turns, but did have her increase speed of head motion slightly.  Gait in hallway x 50' x 2 reps with varying head motions every 2-3 steps.  Note decrease in speed, but no overt LOB or dizziness, however pt not doing full range of head motion so cues for this at hom.  Continued high level balance with emphasis on visual vestibular integration and coordination:  In // bars jumping in 90 deg turns x 10 reps left back to center then R back to center with fatigue only, no increase in dizziness, therefore did 180 turns x 10 reps each direction with rest in between.  Pt reporting 2/10 dizziness but more fatigue.  Standing on foam mat with wide BOS, holding ball in outstretched arms moving in diagonals following with head/eyes x 5-7 reps each direction.  Pt with more difficulty and increased dizziness with down R, up left, but recovers quickly.  Skipping task as above.  Then ended with suicide type activity jogging x 50' with quick turn and touch to floor then returning to start then x 40' and so fouth x 3 reps.  Pt with mild difficulty reaching down and coming back up quickly but no dizziness reported.                 PT Short Term Goals - 02/25/20 1043      PT SHORT TERM GOAL #1   Title  Pt will be indepenent with initial HEP in order to indicate improved functional mobility and decreased fall risk.  (Target Date: 03/26/20)    Time  4    Period  Weeks    Status  New    Target Date  03/26/20      PT SHORT TERM GOAL #2   Title  Pt will improve 5TSS to </=13 secs without UE support in order to indicate improved functional strength.    Time  4    Period  Weeks    Status  New      PT SHORT TERM  GOAL #3   Title  Pt will improve FGA to >/=21/30 in order to indicate decreased fall risk.    Time  4    Period  Weeks    Status  New      PT SHORT TERM GOAL #4   Title  Pt will improve gait speed to >/=4.00 ft/sec in order to indicate more normal gait speed.    Time  4    Period  Weeks    Status  New      PT SHORT TERM GOAL #5   Title  Will participate in formal vestibular assessment and LTGs to be written as appropriate.    Time  4    Period  Weeks    Status  New      Additional Short Term Goals   Additional Short Term Goals  Yes      PT SHORT TERM GOAL #6   Title  Pt will negotiate up/down 12 steps with single rail at mod I level (reciprocal pattern) in order to negotiate in home safely.    Time  4    Period  Weeks    Status  New        PT Long Term Goals - 03/01/20 1229      PT LONG TERM GOAL #1   Title  Pt will be independent with final HEP in order to indicate decreased fall risk and improved functional mobility (Target Date: 04/25/20)    Time  8    Period  Weeks    Status  New      PT LONG TERM GOAL #2   Title  Pt will perform 5TSS </=11 secs in order to indicate improved functional strength.    Time  8    Period  Weeks    Status  New      PT LONG TERM GOAL #3   Title  Pt will improve FGA to >/=25/30 in order to indicate decreased fall risk.    Time  8    Period  Weeks    Status  New      PT LONG TERM GOAL #4   Title  Pt will ambulate >1000' over varying outdoor surfaces at mod I level while scanning environment without LOB in order to indicate safe community mobility.    Time  8    Period  Weeks    Status  New      PT LONG TERM GOAL #5   Title  Pt will demonstrate ability to begin jogging, hiking, [name]    Time  8    Period  Weeks    Status  New      PT LONG TERM GOAL #6   Title  Pt will negotiate up/down 12 steps without rail in reciprocal pattern while carrying items  with BUEs to simulate return to ADLs.    Time  8    Period  Weeks    Status  New      PT LONG TERM GOAL #7   Title  Patient will report no dizziness with bed mobility, quick head and body turns, and bending down to the ground    Time  8    Period  Weeks    Status  New            Plan - 03/20/20 1139    Clinical Impression Statement  Pt has made marked progress with all aspects of mobility since last session.  Updated HEP to reflect progress and continue with high level visual/vestibular challenges.  Pt making excellent progress and verbalized that she is returning to work 5/24 for 5 hour work days.    Personal Factors and Comorbidities  Comorbidity 1;Social Background;Profession;Fitness;Transportation  Comorbidities  craniotomy    Examination-Activity Limitations  Caring for Others;Carry;Lift;Locomotion Level;Reach Overhead;Stairs;Stand    Examination-Participation Restrictions  Community Activity;Driving;Yard Work;Meal Prep    Stability/Clinical Decision Making  Evolving/Moderate complexity    Rehab Potential  Excellent    PT Frequency  Other (comment)   then 2x/wk for 4 weeks   PT Duration  Other (comment)   then 2x/wk for 4 weeks   PT Treatment/Interventions  ADLs/Self Care Home Management;Aquatic Therapy;Gait training;Stair training;Functional mobility training;Therapeutic activities;Balance training;Therapeutic exercise;Neuromuscular re-education;Cognitive remediation;Patient/family education;Passive range of motion;Vestibular    PT Next Visit Plan  STG 5/23-she will likely have met them so can go ahead and check, lots of visual/vestibular challenges, try volleying tennis ball against wall with quicker movements, walking with head turns, corner balance, bending down to ground, endurance training    Consulted and Agree with Plan of Care  Patient       Patient will benefit from skilled therapeutic intervention in order to improve the following deficits and impairments:   Abnormal gait, Decreased activity tolerance, Decreased balance, Decreased cognition, Decreased coordination, Decreased endurance, Decreased mobility, Decreased strength, Dizziness, Impaired sensation, Impaired UE functional use, Postural dysfunction  Visit Diagnosis: Muscle weakness (generalized)  Unsteadiness on feet  Dizziness and giddiness  Other abnormalities of gait and mobility     Problem List Patient Active Problem List   Diagnosis Date Noted  . GBM (glioblastoma multiforme) ([place])   . Steroid-induced hyperglycemia   . Seizures ([place])   . Leucocytosis   . Bradycardia   . Status post craniotomy 02/16/2020  . Tick bite 02/08/2020  . History of recent hospitalization 12/21/2019  . Seizure disorder ([place]) 12/21/2019  . Hypokalemia 12/21/2019  . Long-term use of high-risk medication 12/21/2019  . Frontal mass of brain 12/16/2019  . Family history of colon cancer - father 44 05/26/2015  . Colon cancer screening 03/10/2015  . Vitamin D deficiency 03/10/2015    Cameron Sprang, PT, MPT [facility] [address] [place], [place], [address] Phone: [phone]   Fax:  [phone] 03/20/20, 11:43 AM  Name: Hannah [name] MRN: [id number] Date of Birth: Dec 14, 1960

## 2020-03-21 ENCOUNTER — Telehealth: Payer: Self-pay | Admitting: *Deleted

## 2020-03-21 ENCOUNTER — Ambulatory Visit: Payer: No Typology Code available for payment source

## 2020-03-21 ENCOUNTER — Other Ambulatory Visit: Payer: Self-pay | Admitting: Radiation Therapy

## 2020-03-21 DIAGNOSIS — R4701 Aphasia: Secondary | ICD-10-CM

## 2020-03-21 DIAGNOSIS — R41841 Cognitive communication deficit: Secondary | ICD-10-CM

## 2020-03-21 NOTE — Therapy (Signed)
[facility] [address] [place], [place], [address] Phone: [phone]   Fax:  [phone]  Speech Language Pathology Treatment  Patient Details  Name: Hannah [name] MRN: [id number] Date of Birth: [birth year] Referring Provider (SLP): Duffy Rhody, MD   Encounter Date: 03/21/2020  End of Session - 03/21/20 1318    Visit Number  4    Number of Visits  5    Date for SLP Re-Evaluation  04/25/20    SLP Start Time  1150    SLP Stop Time   1232    SLP Time Calculation (min)  42 min    Activity Tolerance  Patient tolerated treatment well       Past Medical History:  Diagnosis Date  . Cervical spondylolysis 12/15/2019   Skeleton: Mild cervical spondylosis C6-7. Incidental find.   . Left ankle sprain 10/07/2011  . Seizure ([place])    partial   . Tick bite 02/2020    Past Surgical History:  Procedure Laterality Date  . APPLICATION OF CRANIAL NAVIGATION Left 02/16/2020   Procedure: APPLICATION OF CRANIAL NAVIGATION;  Surgeon: Vallarie Mare, MD;  Location: [facility];  Service: Neurosurgery;  Laterality: Left;  . CRANIOTOMY Left 02/16/2020   Procedure: LEFT FRONTAL CRANIOTOMY FOR BRAIN TUMOR;  Surgeon: Vallarie Mare, MD;  Location: [facility];  Service: Neurosurgery;  Laterality: Left;  . FOOT SURGERY Left    bone spur - local anesthesia  . TONSILECTOMY/ADENOIDECTOMY WITH MYRINGOTOMY     tonsils and adenoids out only  . WISDOM TOOTH EXTRACTION      There were no vitals filed for this visit.  Subjective Assessment - 03/21/20 1152    Subjective  "Well I get to go back to work on Monday." Pt working in AMs.    Currently in Pain?  No/denies            ADULT SLP TREATMENT - 03/21/20 1154      General Information   Behavior/Cognition  Alert;Cooperative;Pleasant mood      Treatment Provided   Treatment provided  Cognitive-Linquistic      Cognitive-Linquistic Treatment   Treatment focused on   Cognition;Aphasia    Skilled Treatment  Mod complex conversation completed for 24 minutes with anomia x4 with average 2 to 3-second delay. SLP addressed that pt should talk with a smaller group of people as pt voiced concern that she gets "lost" in larger groups. SLP strongly encouraged to engage in conversation in smaller groups of 3-4 total, which pt stated was "more stressful" for her due to more conversational burden on her and she will not be successful.  SLP educated pt about neuroplsaticity and the brain needs her to talk and "exericse" her language right now. SLP suggested pt write out some things to say on  topic, in a larger group, and she can change the topic to the topic she's practiced/prepared.       Assessment / Recommendations / Plan   Plan  Continue with current plan of care      Progression Toward Goals   Progression toward goals  Progressing toward goals       SLP Education - 03/21/20 1318    Education Details  neuroplasticity, needs to practice talking, compensations in larger groups    Person(s) Educated  Patient    Methods  Explanation    Comprehension  Verbalized understanding         SLP Long Term Goals - 03/21/20 1207  SLP LONG TERM GOAL #1   Title  Patient will demonstrate understanding of appropriate cognitive-linguistic activities for home practice.    Baseline  03-06-20    Status  Achieved      SLP LONG TERM GOAL #2   Title  Patient will use compensations for anomia in 15 minutes mod complex conversation functionally.    Time  --    Period  --    Status  Achieved      SLP LONG TERM GOAL #3   Title  Pt will report use of aids/strategies to assist with multitasking, schedule and information management.    Time  2    Period  Weeks    Status  On-going       Plan - 03/21/20 1319    Clinical Impression Statement  [name] presents with overall mild higher level cognitive and language deficits s/p resection of left frontal glioblastoma.  compensations were discussed today - pt had 4 anomic episods lasting <3 seconds today in mod complex conversation. She cont to report improvements in language production since discharge from hospital. Patient enjoys being physically active in the outdoors; she also enjoys games like majong, sequence, and other card games. I recommend brief course of ST to continue training in cognitive/language home program and compensatory strategies to improve communication and cognitive skills.    Speech Therapy Frequency  1x /week    Duration  4 weeks    Treatment/Interventions  Language facilitation;Cueing hierarchy;SLP instruction and feedback;Compensatory techniques;Cognitive reorganization;Functional tasks;Compensatory strategies;Multimodal communcation approach;Internal/external aids;Patient/family education    Potential to Achieve Goals  Good    Consulted and Agree with Plan of Care  Patient;Family member/caregiver    Family Member Consulted  husband       Patient will benefit from skilled therapeutic intervention in order to improve the following deficits and impairments:   Aphasia  Cognitive communication deficit    Problem List Patient Active Problem List   Diagnosis Date Noted  . GBM (glioblastoma multiforme) ([place])   . Steroid-induced hyperglycemia   . Seizures ([place])   . Leucocytosis   . Bradycardia   . Status post craniotomy 02/16/2020  . Tick bite 02/08/2020  . History of recent hospitalization 12/21/2019  . Seizure disorder ([place]) 12/21/2019  . Hypokalemia 12/21/2019  . Long-term use of high-risk medication 12/21/2019  . Frontal mass of brain 12/16/2019  . Family history of colon cancer - father 86 05/26/2015  . Colon cancer screening 03/10/2015  . Vitamin D deficiency 03/10/2015    [facility] ,MS, CCC-SLP  03/21/2020, 1:53 PM  [facility] [address] [place], [place], [address] Phone: [phone]   Fax:   [phone]   Name: Hannah [name] MRN: [id number] Date of Birth: 09-27-1961

## 2020-03-21 NOTE — Telephone Encounter (Signed)
Faxing work return letter on patients behalf to [phone] AttN: Dorna Bloom

## 2020-03-21 NOTE — Telephone Encounter (Signed)
Completed [employer] STD paperwork and returned via fax to [phone]   Completed updated [employer] corrected forms and faxed to [phone]

## 2020-03-22 ENCOUNTER — Other Ambulatory Visit: Payer: Self-pay

## 2020-03-22 ENCOUNTER — Ambulatory Visit: Payer: No Typology Code available for payment source | Admitting: Physical Therapy

## 2020-03-22 DIAGNOSIS — R2681 Unsteadiness on feet: Secondary | ICD-10-CM

## 2020-03-22 DIAGNOSIS — R278 Other lack of coordination: Secondary | ICD-10-CM

## 2020-03-22 DIAGNOSIS — M6281 Muscle weakness (generalized): Secondary | ICD-10-CM

## 2020-03-22 DIAGNOSIS — R2689 Other abnormalities of gait and mobility: Secondary | ICD-10-CM

## 2020-03-22 DIAGNOSIS — R4701 Aphasia: Secondary | ICD-10-CM | POA: Diagnosis not present

## 2020-03-22 DIAGNOSIS — R42 Dizziness and giddiness: Secondary | ICD-10-CM

## 2020-03-22 NOTE — Therapy (Signed)
[facility] [address] [place], [place], [address] Phone: [phone]   Fax:  [phone]  Physical Therapy Treatment  Patient Details  Name: Hannah [name] MRN: [id number] Date of Birth: 1961/08/19 Referring Provider (PT): Duffy Rhody, MD   Encounter Date: 03/22/2020  PT End of Session - 03/22/20 2123    Visit Number  5    Number of Visits  21    Date for PT Re-Evaluation  04/25/20    Authorization Type  [name]    PT Start Time  0940    PT Stop Time  1020    PT Time Calculation (min)  40 min    Activity Tolerance  Patient tolerated treatment well   some increase in dizziness with certain tasks   Behavior During Therapy  [facility] for tasks assessed/performed       Past Medical History:  Diagnosis Date  . Cervical spondylolysis 12/15/2019   Skeleton: Mild cervical spondylosis C6-7. Incidental find.   . Left ankle sprain 10/07/2011  . Seizure ([place])    partial   . Tick bite 02/2020    Past Surgical History:  Procedure Laterality Date  . APPLICATION OF CRANIAL NAVIGATION Left 02/16/2020   Procedure: APPLICATION OF CRANIAL NAVIGATION;  Surgeon: Vallarie Mare, MD;  Location: [facility];  Service: Neurosurgery;  Laterality: Left;  . CRANIOTOMY Left 02/16/2020   Procedure: LEFT FRONTAL CRANIOTOMY FOR BRAIN TUMOR;  Surgeon: Vallarie Mare, MD;  Location: [facility];  Service: Neurosurgery;  Laterality: Left;  . FOOT SURGERY Left    bone spur - local anesthesia  . TONSILECTOMY/ADENOIDECTOMY WITH MYRINGOTOMY     tonsils and adenoids out only  . WISDOM TOOTH EXTRACTION      There were no vitals filed for this visit.  Subjective Assessment - 03/22/20 2109    Subjective  Had appointment with oncology and has chosen to not do radiation at this time.  Will have a follow up MRI to monitor.  Was supposed to go back to work  next week but may not be able to yet.  Brought tennis racket today.    Pertinent History  No signitifcant history prior to GBM s/p frontal craniotomy 4/14    Patient Stated Goals  "I want to get back to running, playing tennis, hiking, biking.  I want to return to work"    Currently in Pain?  No/denies                         Vestibular Treatment/Exercise - 03/22/20 0947      Vestibular Treatment/Exercise   Vestibular Treatment Provided  Gaze    Gaze Exercises  X1 Viewing Horizontal;X1 Viewing Vertical      X1 Viewing Horizontal   Foot Position  standing feet apart    Reps  2    Comments  progressed to 60 seconds, mild symptoms      X1 Viewing Vertical   Foot Position  standing feet apart    Reps  2    Comments  60 seconds mild symptoms      When performing x1 viewing therapist provided cues for increased speed and slightly decreased ROM.  During first set pt didn't experience symptoms until 50 seconds (1-2/10 dizziness); during second set pt began to experience symptoms at 40 seconds and had to stop before completing  full 60 seconds due to symptoms.     Balance Exercises - 03/22/20 1017      Balance Exercises: Standing   Other Standing Exercises  To address balance, eye/hand coordination and habituation to visual flow and bending down with hitting tennis ball back and forth against a wall, badmitton and bouncing tennis ball on tennis racket while standing; focus on getting 5 in a row.        PT Education - 03/22/20 2120    Education Details  updated HEP, initiating training to return to tennis    Person(s) Educated  Patient    Methods  Explanation;Demonstration    Comprehension  Verbalized understanding;Returned demonstration        Practice hand-eye coordination by bouncing tennis ball on racked while standing in place.  Try to get 5 bounces in a row.  Can also practice hitting a tennis ball against a wall.     Gaze Stabilization: Standing Feet  Apart    Feet shoulder width apart, keeping eyes on target on wall __3__ feet away, tilt head down 15-30 and move head side to side for __60_ seconds. Repeat while moving head up and down for _60__ seconds.   Do _ 2-3__ sessions per day.  Gaze Stabilization: Tip Card  1.Target must remain in focus, not blurry, and appear stationary while head is in motion. 2.Perform exercises with small head movements (45 to either side of midline). 3.Increase speed of head motion so long as target is in focus. 4.If you wear eyeglasses, be sure you can see target through lens (therapist will give specific instructions for bifocal / progressive lenses). 5.These exercises may provoke dizziness or nausea. Work through these symptoms. If too dizzy, slow head movement slightly. Rest between each exercise. 6.Exercises demand concentration; avoid distractions.   Random Direction Head Motion   Walking on solid surface, have person cue you to move head and eyes to the left and right every __3__ steps.  Then perform down and back with looking up and down every 3 steps.   Repeat sequence __2__ times each per session. Do __3__ sessions per day.  To progress this, do fuller range head motions.     Feet Together, Head Motion - Eyes Closed    With eyes closed and feet together, move head slowly, up and down 2-3 times.  Repeat with head turns 2-3 times. Repeat _2___ times per session. Do __3-4__ sessions per day.  Progress this exercise by speeding up head movements slightly. Still use plain background for target.   Reaching / Placing Object, Diagonal Pattern    With feet apart, use two hands to hold ball and move down on right side, then move upward overhead to the left, moving eyes and head with ball. Repeat __5-6__ times per session. Do __2-3__ sessions per day. Do this exercise on yoga mat for balance challenge.  Repeat sequence to opposite side 5-6 reps.    Copyright  VHI. All rights reserved.      PT Short Term Goals - 02/25/20 1043      PT SHORT TERM GOAL #1   Title  Pt will be indepenent with initial HEP in order to indicate improved functional mobility and decreased fall risk.  (Target Date: 03/26/20)    Time  4    Period  Weeks    Status  New    Target Date  03/26/20      PT SHORT TERM GOAL #2   Title  Pt will improve 5TSS  to </=13 secs without UE support in order to indicate improved functional strength.    Time  4    Period  Weeks    Status  New      PT SHORT TERM GOAL #3   Title  Pt will improve FGA to >/=21/30 in order to indicate decreased fall risk.    Time  4    Period  Weeks    Status  New      PT SHORT TERM GOAL #4   Title  Pt will improve gait speed to >/=4.00 ft/sec in order to indicate more normal gait speed.    Time  4    Period  Weeks    Status  New      PT SHORT TERM GOAL #5   Title  Will participate in formal vestibular assessment and LTGs to be written as appropriate.    Time  4    Period  Weeks    Status  New      Additional Short Term Goals   Additional Short Term Goals  Yes      PT SHORT TERM GOAL #6   Title  Pt will negotiate up/down 12 steps with single rail at mod I level (reciprocal pattern) in order to negotiate in home safely.    Time  4    Period  Weeks    Status  New        PT Long Term Goals - 03/01/20 1229      PT LONG TERM GOAL #1   Title  Pt will be independent with final HEP in order to indicate decreased fall risk and improved functional mobility (Target Date: 04/25/20)    Time  8    Period  Weeks    Status  New      PT LONG TERM GOAL #2   Title  Pt will perform 5TSS </=11 secs in order to indicate improved functional strength.    Time  8    Period  Weeks    Status  New      PT LONG TERM GOAL #3   Title  Pt will improve FGA to >/=25/30 in order to indicate decreased fall risk.    Time  8    Period  Weeks    Status  New      PT LONG TERM GOAL #4   Title  Pt will ambulate >1000' over varying outdoor  surfaces at mod I level while scanning environment without LOB in order to indicate safe community mobility.    Time  8    Period  Weeks    Status  New      PT LONG TERM GOAL #5   Title  Pt will demonstrate ability to begin jogging, hiking, [name]    Time  8    Period  Weeks    Status  New      PT LONG TERM GOAL #6   Title  Pt will negotiate up/down 12 steps without rail in reciprocal pattern while carrying items with BUEs to simulate return to ADLs.    Time  8    Period  Weeks    Status  New      PT LONG TERM GOAL #7   Title  Patient will report no dizziness with bed mobility, quick head and body turns, and bending down to the ground    Time  8  Period  Weeks    Status  New            Plan - 03/22/20 2129    Clinical Impression Statement  Continued to progress gaze adaptation; pt able to tolerate increase in time but also demonstrated latency and build up of symptoms with two sets.  Initiated return to tennis by performing ball hits to a wall, badmitton and bouncing tennis ball on racket to address coordination, habituation to fast head movements and maintaining gaze stability on a moving target.  Pt demonstrates difficulty with coordinating RUE movement and amount of force needed.  WIll continue to progress towards LTG.    Personal Factors and Comorbidities  Comorbidity 1;Social Background;Profession;Fitness;Transportation    Comorbidities  craniotomy    Examination-Activity Limitations  Caring for Others;Carry;Lift;Locomotion Level;Reach Overhead;Stairs;Stand    Examination-Participation Restrictions  Community Activity;Driving;Yard Work;Meal Prep    Stability/Clinical Decision Making  Evolving/Moderate complexity    Rehab Potential  Excellent    PT Frequency  Other (comment)   then 2x/wk for 4 weeks   PT Duration  Other (comment)   then 2x/wk for 4 weeks   PT Treatment/Interventions  ADLs/Self  Care Home Management;Aquatic Therapy;Gait training;Stair training;Functional mobility training;Therapeutic activities;Balance training;Therapeutic exercise;Neuromuscular re-education;Cognitive remediation;Patient/family education;Passive range of motion;Vestibular    PT Next Visit Plan  CHECK STG 5/23-continue to practice tennis; gait with head turns, corner balance, bending down to ground, endurance training    Consulted and Agree with Plan of Care  Patient       Patient will benefit from skilled therapeutic intervention in order to improve the following deficits and impairments:  Abnormal gait, Decreased activity tolerance, Decreased balance, Decreased cognition, Decreased coordination, Decreased endurance, Decreased mobility, Decreased strength, Dizziness, Impaired sensation, Impaired UE functional use, Postural dysfunction  Visit Diagnosis: Muscle weakness (generalized)  Unsteadiness on feet  Dizziness and giddiness  Other abnormalities of gait and mobility  Other lack of coordination     Problem List Patient Active Problem List   Diagnosis Date Noted  . GBM (glioblastoma multiforme) ([place])   . Steroid-induced hyperglycemia   . Seizures ([place])   . Leucocytosis   . Bradycardia   . Status post craniotomy 02/16/2020  . Tick bite 02/08/2020  . History of recent hospitalization 12/21/2019  . Seizure disorder ([place]) 12/21/2019  . Hypokalemia 12/21/2019  . Long-term use of high-risk medication 12/21/2019  . Frontal mass of brain 12/16/2019  . Family history of colon cancer - father 12 05/26/2015  . Colon cancer screening 03/10/2015  . Vitamin D deficiency 03/10/2015    Hannah [name] 03/22/2020, 9:36 PM  [facility] [address] [place], [place], [address] Phone: [phone]   Fax:  [phone]  Name: Hannah [name] MRN: [id number] Date of Birth: 1961/02/23

## 2020-03-22 NOTE — Patient Instructions (Addendum)
Practice hand-eye coordination by bouncing tennis ball on racked while standing in place.  Try to get 5 bounces in a row.  Can also practice hitting a tennis ball against a wall.     Gaze Stabilization: Standing Feet Apart    Feet shoulder width apart, keeping eyes on target on wall __3__ feet away, tilt head down 15-30 and move head side to side for __60_ seconds. Repeat while moving head up and down for _60__ seconds.   Do _ 2-3__ sessions per day.  Gaze Stabilization: Tip Card  1.Target must remain in focus, not blurry, and appear stationary while head is in motion. 2.Perform exercises with small head movements (45 to either side of midline). 3.Increase speed of head motion so long as target is in focus. 4.If you wear eyeglasses, be sure you can see target through lens (therapist will give specific instructions for bifocal / progressive lenses). 5.These exercises may provoke dizziness or nausea. Work through these symptoms. If too dizzy, slow head movement slightly. Rest between each exercise. 6.Exercises demand concentration; avoid distractions.   Random Direction Head Motion   Walking on solid surface, have person cue you to move head and eyes to the left and right every __3__ steps.  Then perform down and back with looking up and down every 3 steps.   Repeat sequence __2__ times each per session. Do __3__ sessions per day.  To progress this, do fuller range head motions.     Feet Together, Head Motion - Eyes Closed    With eyes closed and feet together, move head slowly, up and down 2-3 times.  Repeat with head turns 2-3 times. Repeat _2___ times per session. Do __3-4__ sessions per day.  Progress this exercise by speeding up head movements slightly. Still use plain background for target.   Reaching / Placing Object, Diagonal Pattern    With feet apart, use two hands to hold ball and move down on right side, then move upward overhead to the left, moving eyes and head  with ball. Repeat __5-6__ times per session. Do __2-3__ sessions per day. Do this exercise on yoga mat for balance challenge.  Repeat sequence to opposite side 5-6 reps.    Copyright  VHI. All rights reserved.

## 2020-03-23 ENCOUNTER — Telehealth: Payer: Self-pay | Admitting: *Deleted

## 2020-03-23 NOTE — Telephone Encounter (Signed)
Redoing FMLA and STD paperwork with updated expected to return to work date 05/01/2020

## 2020-03-23 NOTE — Telephone Encounter (Signed)
Patient has asked to move out the return to work date after speaking to her employer.  FMLA, disability paperwork and return to work note to employer will be corrected to include return to work date of 05/01/2020 (subject to change).

## 2020-03-24 ENCOUNTER — Encounter: Payer: Self-pay | Admitting: Physical Therapy

## 2020-03-24 ENCOUNTER — Other Ambulatory Visit: Payer: Self-pay

## 2020-03-24 ENCOUNTER — Ambulatory Visit: Payer: No Typology Code available for payment source | Admitting: Physical Therapy

## 2020-03-24 DIAGNOSIS — M6281 Muscle weakness (generalized): Secondary | ICD-10-CM

## 2020-03-24 DIAGNOSIS — R42 Dizziness and giddiness: Secondary | ICD-10-CM

## 2020-03-24 DIAGNOSIS — R4701 Aphasia: Secondary | ICD-10-CM | POA: Diagnosis not present

## 2020-03-24 DIAGNOSIS — R278 Other lack of coordination: Secondary | ICD-10-CM

## 2020-03-24 DIAGNOSIS — R2689 Other abnormalities of gait and mobility: Secondary | ICD-10-CM

## 2020-03-24 DIAGNOSIS — R2681 Unsteadiness on feet: Secondary | ICD-10-CM

## 2020-03-24 NOTE — Therapy (Signed)
[facility] [address] [place], [place], [address] Phone: [phone]   Fax:  [phone]  Physical Therapy Treatment  Patient Details  Name: Hannah [name] MRN: [id number] Date of Birth: July 20, 1961 Referring Provider (PT): Duffy Rhody, MD   Encounter Date: 03/24/2020  PT End of Session - 03/24/20 0935    Visit Number  6    Number of Visits  21    Date for PT Re-Evaluation  04/25/20    Authorization Type  Hannah [name] focus is primary and [employer] is secondary - $40 copay and no VL but requires auth after visit 12    PT Start Time  0848    PT Stop Time  0933    PT Time Calculation (min)  45 min    Activity Tolerance  Patient tolerated treatment well   some increase in dizziness with certain tasks   Behavior During Therapy  [facility] for tasks assessed/performed       Past Medical History:  Diagnosis Date  . Cervical spondylolysis 12/15/2019   Skeleton: Mild cervical spondylosis C6-7. Incidental find.   . Left ankle sprain 10/07/2011  . Seizure ([place])    partial   . Tick bite 02/2020    Past Surgical History:  Procedure Laterality Date  . APPLICATION OF CRANIAL NAVIGATION Left 02/16/2020   Procedure: APPLICATION OF CRANIAL NAVIGATION;  Surgeon: Vallarie Mare, MD;  Location: [facility];  Service: Neurosurgery;  Laterality: Left;  . CRANIOTOMY Left 02/16/2020   Procedure: LEFT FRONTAL CRANIOTOMY FOR BRAIN TUMOR;  Surgeon: Vallarie Mare, MD;  Location: [facility];  Service: Neurosurgery;  Laterality: Left;  . FOOT SURGERY Left    bone spur - local anesthesia  . TONSILECTOMY/ADENOIDECTOMY WITH MYRINGOTOMY     tonsils and adenoids out only  . WISDOM TOOTH EXTRACTION      There were no vitals filed for this visit.  Subjective Assessment - 03/24/20 0850    Subjective  Felt fine after last session. Didn't bring racket today.  Was suprised at how uncoordinated she was.    Pertinent History  No signitifcant history prior  to GBM s/p frontal craniotomy 4/14    Patient Stated Goals  "I want to get back to running, playing tennis, hiking, biking.  I want to return to work"    Currently in Pain?  No/denies         [facility] PT Assessment - 03/24/20 0855      Ambulation/Gait   Stairs  Yes    Stairs Assistance  6: Modified independent (Device/Increase time)    Stair Management Technique  No rails;Alternating pattern;Forwards    Number of Stairs  12    Height of Stairs  6      Standardized Balance Assessment   Standardized Balance Assessment  Five Times Sit to Stand;10 meter walk test    Five times sit to stand comments   14.4 seconds from chair, no UE support    10 Meter Walk  7.5 seconds or 4.37 ft/sec      Functional Gait  Assessment   Gait assessed   Yes    Gait Level Surface  Walks 20 ft in less than 5.5 sec, no assistive devices, good speed, no evidence for imbalance, normal gait pattern, deviates no more than 6 in outside of the 12 in walkway width.    Change in Gait Speed  Able to smoothly change walking speed without loss of balance or gait deviation. Deviate no more than  6 in outside of the 12 in walkway width.    Gait with Horizontal Head Turns  Performs head turns smoothly with slight change in gait velocity (eg, minor disruption to smooth gait path), deviates 6-10 in outside 12 in walkway width, or uses an assistive device.    Gait with Vertical Head Turns  Performs head turns with no change in gait. Deviates no more than 6 in outside 12 in walkway width.    Gait and Pivot Turn  Pivot turns safely within 3 sec and stops quickly with no loss of balance.    Step Over Obstacle  Is able to step over 2 stacked shoe boxes taped together (9 in total height) without changing gait speed. No evidence of imbalance.    Gait with Narrow Base of Support  Is able to ambulate for 10 steps heel to toe with no staggering.    Gait with Eyes Closed  Cannot walk 20 ft without assistance, severe gait deviations or  imbalance, deviates greater than 15 in outside 12 in walkway width or will not attempt task.    Ambulating Backwards  Walks 20 ft, uses assistive device, slower speed, mild gait deviations, deviates 6-10 in outside 12 in walkway width.    Steps  Alternating feet, no rail.    Total Score  25    FGA comment:  25/30 low falls risk                    [facility] Adult PT Treatment/Exercise - 03/24/20 0855      Therapeutic Activites    Therapeutic Activities  Other Therapeutic Activities    Other Therapeutic Activities  Patient asking about safety doing yoga at home.  DIscussed using a chair or blocks propped up to avoid fully inverting head and to progress slowly back into yoga poses.  Also discussed keeping eyes straight ahead and not performing extreme head movements.      Neuro Re-ed    Neuro Re-ed Details   Performed badmittion outside on grass to focus on eye-hand coordination, balance and increased speed of head movements x 10 minutes             PT Education - 03/24/20 0935    Education Details  progress towards STG    Person(s) Educated  Patient    Methods  Explanation    Comprehension  Verbalized understanding       PT Short Term Goals - 03/24/20 3291      PT SHORT TERM GOAL #1   Title  Pt will be indepenent with initial HEP in order to indicate improved functional mobility and decreased fall risk.  (Target Date: 03/26/20)    Time  4    Period  Weeks    Status  Achieved    Target Date  03/26/20      PT SHORT TERM GOAL #2   Title  Pt will improve 5TSS to </=13 secs without UE support in order to indicate improved functional strength.    Baseline  14    Time  4    Period  Weeks    Status  Partially Met      PT SHORT TERM GOAL #3   Title  Pt will improve FGA to >/=21/30 in order to indicate decreased fall risk.    Baseline  25/30    Time  4    Period  Weeks    Status  Achieved      PT SHORT TERM GOAL #  4   Title  Pt will improve gait speed to >/=4.00  ft/sec in order to indicate more normal gait speed.    Baseline  4.3 ft/sec    Time  4    Period  Weeks    Status  Achieved      PT SHORT TERM GOAL #5   Title  Will participate in formal vestibular assessment and LTGs to be written as appropriate.    Time  4    Period  Weeks    Status  Achieved      PT SHORT TERM GOAL #6   Title  Pt will negotiate up/down 12 steps with single rail at mod I level (reciprocal pattern) in order to negotiate in home safely.    Baseline  performed 12 alternating sequence no rails    Time  4    Period  Weeks    Status  Achieved        PT Long Term Goals - 03/01/20 1229      PT LONG TERM GOAL #1   Title  Pt will be independent with final HEP in order to indicate decreased fall risk and improved functional mobility (Target Date: 04/25/20)    Time  8    Period  Weeks    Status  New      PT LONG TERM GOAL #2   Title  Pt will perform 5TSS </=11 secs in order to indicate improved functional strength.    Time  8    Period  Weeks    Status  New      PT LONG TERM GOAL #3   Title  Pt will improve FGA to >/=25/30 in order to indicate decreased fall risk.    Time  8    Period  Weeks    Status  New      PT LONG TERM GOAL #4   Title  Pt will ambulate >1000' over varying outdoor surfaces at mod I level while scanning environment without LOB in order to indicate safe community mobility.    Time  8    Period  Weeks    Status  New      PT LONG TERM GOAL #5   Title  Pt will demonstrate ability to begin jogging, hiking, [name]    Time  8    Period  Weeks    Status  New      PT LONG TERM GOAL #6   Title  Pt will negotiate up/down 12 steps without rail in reciprocal pattern while carrying items with BUEs to simulate return to ADLs.    Time  8    Period  Weeks    Status  New      PT LONG TERM GOAL #7   Title  Patient will report no dizziness with bed mobility, quick head and  body turns, and bending down to the ground    Time  8    Period  Weeks    Status  New            Plan - 03/24/20 1221    Clinical Impression Statement  Performed assessment of progress towards STG.  Pt is making good progress and has met all but 1 STG - pt partially met 5 time sit to stand goal - time improved but not to goal level.  Pt does demonstrate increased gait velocity, improved balance during higher level  gait and decreased falls risk.  Pt continues to report impaired coordination, dizziness and imbalance with more dynamic head movements and activities.  Will continue to address and progress towards LTG.    Personal Factors and Comorbidities  Comorbidity 1;Social Background;Profession;Fitness;Transportation    Comorbidities  craniotomy    Examination-Activity Limitations  Caring for Others;Carry;Lift;Locomotion Level;Reach Overhead;Stairs;Stand    Examination-Participation Restrictions  Community Activity;Driving;Yard Work;Meal Prep    Stability/Clinical Decision Making  Evolving/Moderate complexity    Rehab Potential  Excellent    PT Frequency  Other (comment)   then 2x/wk for 4 weeks   PT Duration  Other (comment)   then 2x/wk for 4 weeks   PT Treatment/Interventions  ADLs/Self Care Home Management;Aquatic Therapy;Gait training;Stair training;Functional mobility training;Therapeutic activities;Balance training;Therapeutic exercise;Neuromuscular re-education;Cognitive remediation;Patient/family education;Passive range of motion;Vestibular    PT Next Visit Plan  continue to practice tennis eye hand coordination, progress x1 viewing; did she try yoga?  gait with head turns, corner balance, bending down to ground, endurance training    Consulted and Agree with Plan of Care  Patient       Patient will benefit from skilled therapeutic intervention in order to improve the following deficits and impairments:  Abnormal gait, Decreased activity tolerance, Decreased balance, Decreased  cognition, Decreased coordination, Decreased endurance, Decreased mobility, Decreased strength, Dizziness, Impaired sensation, Impaired UE functional use, Postural dysfunction  Visit Diagnosis: Muscle weakness (generalized)  Unsteadiness on feet  Dizziness and giddiness  Other abnormalities of gait and mobility  Other lack of coordination     Problem List Patient Active Problem List   Diagnosis Date Noted  . GBM (glioblastoma multiforme) ([facility])   . Steroid-induced hyperglycemia   . Seizures ([facility])   . Leucocytosis   . Bradycardia   . Status post craniotomy 02/16/2020  . Tick bite 02/08/2020  . History of recent hospitalization 12/21/2019  . Seizure disorder ([facility]) 12/21/2019  . Hypokalemia 12/21/2019  . Long-term use of high-risk medication 12/21/2019  . Frontal mass of brain 12/16/2019  . Family history of colon cancer - father 9 05/26/2015  . Colon cancer screening 03/10/2015  . Vitamin D deficiency 03/10/2015    Rico Junker, PT, DPT 03/24/20    12:27 PM    [facility] [address] [place], [place], [address] Phone: [phone]   Fax:  [phone]  Name: Hannah [name] MRN: [id number] Date of Birth: Dec 07, 1960

## 2020-03-27 ENCOUNTER — Encounter: Payer: Self-pay | Admitting: Physical Therapy

## 2020-03-27 ENCOUNTER — Other Ambulatory Visit: Payer: Self-pay

## 2020-03-27 ENCOUNTER — Ambulatory Visit: Payer: No Typology Code available for payment source | Admitting: Physical Therapy

## 2020-03-27 DIAGNOSIS — M6281 Muscle weakness (generalized): Secondary | ICD-10-CM

## 2020-03-27 DIAGNOSIS — R4701 Aphasia: Secondary | ICD-10-CM | POA: Diagnosis not present

## 2020-03-27 DIAGNOSIS — R2689 Other abnormalities of gait and mobility: Secondary | ICD-10-CM

## 2020-03-27 DIAGNOSIS — R278 Other lack of coordination: Secondary | ICD-10-CM

## 2020-03-27 DIAGNOSIS — R2681 Unsteadiness on feet: Secondary | ICD-10-CM

## 2020-03-27 DIAGNOSIS — R42 Dizziness and giddiness: Secondary | ICD-10-CM

## 2020-03-27 NOTE — Patient Instructions (Addendum)
Practice hand-eye coordination by bouncing tennis ball on racket while standing in place.  Try to get 5 bounces in a row.  Can also practice hitting a tennis ball against a wall.     Gaze Stabilization: Standing Feet Apart    Feet shoulder width apart, keeping eyes on target on wall __3__ feet away, tilt head down 15-30 and move head side to side for __60_ seconds. Repeat while moving head up and down for _60__ seconds.   Do _ 2-3__ sessions per day.  Gaze Stabilization: Tip Card  1.Target must remain in focus, not blurry, and appear stationary while head is in motion. 2.Perform exercises with small head movements (45 to either side of midline). 3.Increase speed of head motion so long as target is in focus. 4.If you wear eyeglasses, be sure you can see target through lens (therapist will give specific instructions for bifocal / progressive lenses). 5.These exercises may provoke dizziness or nausea. Work through these symptoms. If too dizzy, slow head movement slightly. Rest between each exercise. 6.Exercises demand concentration; avoid distractions.   Random Direction Head Motion   Walking forwards on solid surface, move head and eyes to the left and right every __3__ steps.  Repeat looking side to side while walking backwards.  Perform 2 laps.  Then walk forwards perform head nods looking up and down every 3 steps.   Repeat walking backwards.  Perform 2 laps.  Do __2__ sessions per day.   Step: Up, Anterior    Stand facing your steps. Skip a step (step to second step). Raise body using top leg only and float back leg. Step down backward, lower body slowly.  Switch legs and repeat. Repeat _10___ times per set. Do __2__ sets per session. Do __5__ sessions per week.   Reaching / Placing Object, Diagonal Pattern    With feet apart, use two hands to hold ball and move down on right side, then move upward overhead to the left, moving eyes and head with ball. Repeat __5-6__  times per session. Do __2-3__ sessions per day. Do this exercise on yoga mat for balance challenge.  Repeat sequence to opposite side 5-6 reps.     Walking    PERFORM WITH ONE HAND TOUCHING YOUR COUNTER TOP, PERFORM WITH EYES CLOSED.   Walk forwards slowly along counter top keeping a straight path.  At the end, walk backwards with eyes closed to starting point keeping a straight path. Repeat __3__ times per session. Do __2__ sessions per day.

## 2020-03-27 NOTE — Therapy (Signed)
[facility] [address] [place], [place], [address] Phone: [phone]   Fax:  [phone]  Physical Therapy Treatment  Patient Details  Name: Hannah [name] MRN: [id number] Date of Birth: Aug 21, 1961 Referring Provider (PT): Duffy Rhody, MD   Encounter Date: 03/27/2020  PT End of Session - 03/27/20 1211    Visit Number  7    Number of Visits  21    Date for PT Re-Evaluation  04/25/20    Authorization Type  [name]    PT Start Time  1015    PT Stop Time  1104    PT Time Calculation (min)  49 min    Activity Tolerance  Patient tolerated treatment well   some increase in dizziness with certain tasks   Behavior During Therapy  [facility] for tasks assessed/performed       Past Medical History:  Diagnosis Date  . Cervical spondylolysis 12/15/2019   Skeleton: Mild cervical spondylosis C6-7. Incidental find.   . Left ankle sprain 10/07/2011  . Seizure ([place])    partial   . Tick bite 02/2020    Past Surgical History:  Procedure Laterality Date  . APPLICATION OF CRANIAL NAVIGATION Left 02/16/2020   Procedure: APPLICATION OF CRANIAL NAVIGATION;  Surgeon: Vallarie Mare, MD;  Location: [place] [facility];  Service: Neurosurgery;  Laterality: Left;  . CRANIOTOMY Left 02/16/2020   Procedure: LEFT FRONTAL CRANIOTOMY FOR BRAIN TUMOR;  Surgeon: Vallarie Mare, MD;  Location: [facility];  Service: Neurosurgery;  Laterality: Left;  . FOOT SURGERY Left    bone spur - local anesthesia  . TONSILECTOMY/ADENOIDECTOMY WITH MYRINGOTOMY     tonsils and adenoids out only  . WISDOM TOOTH EXTRACTION      There were no vitals filed for this visit.  Subjective Assessment - 03/27/20 1021    Subjective  Went hiking this weekend - not able to go up a high rock with RLE, had to use LLE.  No symptoms after doing activity outside Friday.  Didn't get to try  yoga this past weekend.  Pt reports that when her HR goes up to a certain level she gets symptoms of pre-syncope - happened a little bit this weekend when hiking.  Pt knows how to recognize and adjust exertion level when symptoms start.    Pertinent History  No signitifcant history prior to GBM s/p frontal craniotomy 4/14    Patient Stated Goals  "I want to get back to running, playing tennis, hiking, biking.  I want to return to work"    Currently in Pain?  No/denies                        [facility] Adult PT Treatment/Exercise - 03/27/20 1217      Exercises   Exercises  Knee/Hip      Knee/Hip Exercises: Standing   Forward Step Up  Right;Left;1 set;10 reps;Hand Hold: 0;Other (comment)    Forward Step Up Limitations  performed large step up by skipping a step (12") step up with contralateral LE floating in the air, attempted to perform without UE support but did touch rails if needed, mild imbalance on RLE      Vestibular Treatment/Exercise - 03/27/20 1031      Vestibular Treatment/Exercise   Vestibular Treatment Provided  Gaze      X1 Viewing Horizontal  Foot Position  standing feet apart    Reps  2    Comments  Only able to tolerate 35 seconds due to fatigue today; second set performed for 30 seconds only.  Not able to progress today.      X1 Viewing Vertical   Foot Position  standing feet apart    Reps  2    Comments  Only able to tolerate 45 seconds due to fatigue.  2nd set only performed 30 seconds due to decreased tolerance today.  Not able to progress today         Balance Exercises - 03/27/20 1040      Balance Exercises: Standing   Gait with Head Turns  Forward;Retro;4 reps   head nods, turns every 3 steps      Practice hand-eye coordination by bouncing tennis ball on racket while standing in place.  Try to get 5 bounces in a row.  Can also practice hitting a tennis ball against a wall.     Gaze Stabilization: Standing Feet Apart    Feet  shoulder width apart, keeping eyes on target on wall __3__ feet away, tilt head down 15-30 and move head side to side for __60_ seconds. Repeat while moving head up and down for _60__ seconds.   Do _ 2-3__ sessions per day.  Gaze Stabilization: Tip Card  1.Target must remain in focus, not blurry, and appear stationary while head is in motion. 2.Perform exercises with small head movements (45 to either side of midline). 3.Increase speed of head motion so long as target is in focus. 4.If you wear eyeglasses, be sure you can see target through lens (therapist will give specific instructions for bifocal / progressive lenses). 5.These exercises may provoke dizziness or nausea. Work through these symptoms. If too dizzy, slow head movement slightly. Rest between each exercise. 6.Exercises demand concentration; avoid distractions.   Random Direction Head Motion   Walking forwards on solid surface, move head and eyes to the left and right every __3__ steps.  Repeat looking side to side while walking backwards.  Perform 2 laps.  Then walk forwards perform head nods looking up and down every 3 steps.   Repeat walking backwards.  Perform 2 laps.  Do __2__ sessions per day.   Step: Up, Anterior    Stand facing your steps. Skip a step (step to second step). Raise body using top leg only and float back leg. Step down backward, lower body slowly.  Switch legs and repeat. Repeat _10___ times per set. Do __2__ sets per session. Do __5__ sessions per week.   Reaching / Placing Object, Diagonal Pattern    With feet apart, use two hands to hold ball and move down on right side, then move upward overhead to the left, moving eyes and head with ball. Repeat __5-6__ times per session. Do __2-3__ sessions per day. Do this exercise on yoga mat for balance challenge.  Repeat sequence to opposite side 5-6 reps.     Walking    PERFORM WITH ONE HAND TOUCHING YOUR COUNTER TOP, PERFORM WITH EYES CLOSED.    Walk forwards slowly along counter top keeping a straight path.  At the end, walk backwards with eyes closed to starting point keeping a straight path. Repeat __3__ times per session. Do __2__ sessions per day.    PT Education - 03/27/20 1211    Education Details  updated HEP    Person(s) Educated  Patient    Methods  Explanation;Demonstration;Handout  Comprehension  Verbalized understanding;Returned demonstration       PT Short Term Goals - 03/24/20 7209      PT SHORT TERM GOAL #1   Title  Pt will be indepenent with initial HEP in order to indicate improved functional mobility and decreased fall risk.  (Target Date: 03/26/20)    Time  4    Period  Weeks    Status  Achieved    Target Date  03/26/20      PT SHORT TERM GOAL #2   Title  Pt will improve 5TSS to </=13 secs without UE support in order to indicate improved functional strength.    Baseline  14    Time  4    Period  Weeks    Status  Partially Met      PT SHORT TERM GOAL #3   Title  Pt will improve FGA to >/=21/30 in order to indicate decreased fall risk.    Baseline  25/30    Time  4    Period  Weeks    Status  Achieved      PT SHORT TERM GOAL #4   Title  Pt will improve gait speed to >/=4.00 ft/sec in order to indicate more normal gait speed.    Baseline  4.3 ft/sec    Time  4    Period  Weeks    Status  Achieved      PT SHORT TERM GOAL #5   Title  Will participate in formal vestibular assessment and LTGs to be written as appropriate.    Time  4    Period  Weeks    Status  Achieved      PT SHORT TERM GOAL #6   Title  Pt will negotiate up/down 12 steps with single rail at mod I level (reciprocal pattern) in order to negotiate in home safely.    Baseline  performed 12 alternating sequence no rails    Time  4    Period  Weeks    Status  Achieved        PT Long Term Goals - 03/01/20 1229      PT LONG TERM GOAL #1   Title  Pt will be independent with final HEP in order to indicate decreased fall  risk and improved functional mobility (Target Date: 04/25/20)    Time  8    Period  Weeks    Status  New      PT LONG TERM GOAL #2   Title  Pt will perform 5TSS </=11 secs in order to indicate improved functional strength.    Time  8    Period  Weeks    Status  New      PT LONG TERM GOAL #3   Title  Pt will improve FGA to >/=25/30 in order to indicate decreased fall risk.    Time  8    Period  Weeks    Status  New      PT LONG TERM GOAL #4   Title  Pt will ambulate >1000' over varying outdoor surfaces at mod I level while scanning environment without LOB in order to indicate safe community mobility.    Time  8    Period  Weeks    Status  New      PT LONG TERM GOAL #5   Title  Pt will demonstrate ability to begin jogging, hiking, and biking activities at S level in order to indicate return to leisure  activity.    Time  8    Period  Weeks    Status  New      PT LONG TERM GOAL #6   Title  Pt will negotiate up/down 12 steps without rail in reciprocal pattern while carrying items with BUEs to simulate return to ADLs.    Time  8    Period  Weeks    Status  New      PT LONG TERM GOAL #7   Title  Patient will report no dizziness with bed mobility, quick head and body turns, and bending down to the ground    Time  8    Period  Weeks    Status  New            Plan - 03/27/20 1212    Clinical Impression Statement  Treatment session focused on review and progression of HEP based on progress and areas of continued impairment after performing STG assessment last week.  Not able to progress x1 viewing today as pt continues to have moderate symptoms after performing in standing x 30 seconds.  Did progress walking with head turns to include backwards walking and added walking with eyes closed forwards/backwards to HEP due to ongoing issues with dynamic balance with eyes closed.  Pt's static balance with eyes closed has improved.  Also added large step ups due to weakness and balance  impairments pt reported while hiking this weekend.  Will continue to address in order to progress towards LTG.    Personal Factors and Comorbidities  Comorbidity 1;Social Background;Profession;Fitness;Transportation    Comorbidities  craniotomy    Examination-Activity Limitations  Caring for Others;Carry;Lift;Locomotion Level;Reach Overhead;Stairs;Stand    Examination-Participation Restrictions  Community Activity;Driving;Yard Work;Meal Prep    Stability/Clinical Decision Making  Evolving/Moderate complexity    Rehab Potential  Excellent    PT Frequency  Other (comment)   then 2x/wk for 4 weeks   PT Duration  Other (comment)   then 2x/wk for 4 weeks   PT Treatment/Interventions  ADLs/Self Care Home Management;Aquatic Therapy;Gait training;Stair training;Functional mobility training;Therapeutic activities;Balance training;Therapeutic exercise;Neuromuscular re-education;Cognitive remediation;Patient/family education;Passive range of motion;Vestibular    PT Next Visit Plan  continue to practice tennis eye hand coordination, progress x1 viewing; did she try yoga?  continue to work on large step ups for hiking, balance with eyes closed, bending down to ground, endurance training    Consulted and Agree with Plan of Care  Patient       Patient will benefit from skilled therapeutic intervention in order to improve the following deficits and impairments:  Abnormal gait, Decreased activity tolerance, Decreased balance, Decreased cognition, Decreased coordination, Decreased endurance, Decreased mobility, Decreased strength, Dizziness, Impaired sensation, Impaired UE functional use, Postural dysfunction  Visit Diagnosis: Muscle weakness (generalized)  Unsteadiness on feet  Dizziness and giddiness  Other abnormalities of gait and mobility  Other lack of coordination     Problem List Patient Active Problem List   Diagnosis Date Noted  . GBM (glioblastoma multiforme) ([place])   . Steroid-induced  hyperglycemia   . Seizures ([place])   . Leucocytosis   . Bradycardia   . Status post craniotomy 02/16/2020  . Tick bite 02/08/2020  . History of recent hospitalization 12/21/2019  . Seizure disorder ([place]) 12/21/2019  . Hypokalemia 12/21/2019  . Long-term use of high-risk medication 12/21/2019  . Frontal mass of brain 12/16/2019  . Family history of colon cancer - father 42 05/26/2015  . Colon cancer screening 03/10/2015  . Vitamin D deficiency  03/10/2015    Rico Junker, PT, DPT 03/27/20    12:18 PM    [facility] [address] [place], [place], [address] Phone: [phone]   Fax:  [phone]  Name: Hannah [name] MRN: [id number] Date of Birth: 1961-11-01

## 2020-03-29 ENCOUNTER — Encounter (HOSPITAL_COMMUNITY): Payer: Self-pay | Admitting: Internal Medicine

## 2020-03-30 ENCOUNTER — Ambulatory Visit: Payer: No Typology Code available for payment source | Admitting: Physical Therapy

## 2020-04-04 ENCOUNTER — Ambulatory Visit: Payer: No Typology Code available for payment source | Admitting: Physical Therapy

## 2020-04-05 ENCOUNTER — Encounter: Payer: Self-pay | Admitting: Occupational Therapy

## 2020-04-05 ENCOUNTER — Ambulatory Visit: Payer: No Typology Code available for payment source | Admitting: Occupational Therapy

## 2020-04-05 ENCOUNTER — Other Ambulatory Visit: Payer: Self-pay

## 2020-04-05 DIAGNOSIS — Z800 Family history of malignant neoplasm of digestive organs: Secondary | ICD-10-CM | POA: Diagnosis not present

## 2020-04-05 DIAGNOSIS — R4701 Aphasia: Secondary | ICD-10-CM | POA: Insufficient documentation

## 2020-04-05 DIAGNOSIS — R2689 Other abnormalities of gait and mobility: Secondary | ICD-10-CM | POA: Insufficient documentation

## 2020-04-05 DIAGNOSIS — R278 Other lack of coordination: Secondary | ICD-10-CM

## 2020-04-05 DIAGNOSIS — M6281 Muscle weakness (generalized): Secondary | ICD-10-CM | POA: Insufficient documentation

## 2020-04-05 DIAGNOSIS — Z8249 Family history of ischemic heart disease and other diseases of the circulatory system: Secondary | ICD-10-CM | POA: Diagnosis not present

## 2020-04-05 DIAGNOSIS — R41841 Cognitive communication deficit: Secondary | ICD-10-CM | POA: Insufficient documentation

## 2020-04-05 DIAGNOSIS — C711 Malignant neoplasm of frontal lobe: Secondary | ICD-10-CM | POA: Diagnosis not present

## 2020-04-05 DIAGNOSIS — Z833 Family history of diabetes mellitus: Secondary | ICD-10-CM | POA: Diagnosis not present

## 2020-04-05 DIAGNOSIS — R42 Dizziness and giddiness: Secondary | ICD-10-CM | POA: Insufficient documentation

## 2020-04-05 DIAGNOSIS — Z87891 Personal history of nicotine dependence: Secondary | ICD-10-CM | POA: Diagnosis not present

## 2020-04-05 DIAGNOSIS — R2681 Unsteadiness on feet: Secondary | ICD-10-CM | POA: Insufficient documentation

## 2020-04-05 DIAGNOSIS — Z79899 Other long term (current) drug therapy: Secondary | ICD-10-CM | POA: Diagnosis not present

## 2020-04-05 DIAGNOSIS — I69851 Hemiplegia and hemiparesis following other cerebrovascular disease affecting right dominant side: Secondary | ICD-10-CM | POA: Insufficient documentation

## 2020-04-05 NOTE — Patient Instructions (Addendum)
     Coordination Activities  Perform the following activities for 15-20 minutes 1 times per day with right hand(s).   Rotate ball in fingertips (clockwise and counter-clockwise).  Flip cards 1 at a time as fast as you can (can also do with counting cards)  Deal cards with your thumb (Hold deck in hand and push card off top with thumb).--count 2 piles of 15.  Shuffle cards.  Stack 10 coins  Pick up coins one at a time until you get 5-10 in your hand, then move coins from palm to fingertips to place in container one at a time.  Rotate 2 golf balls in hand  Practice writing (gel pen or bigger grip may help) and typing (right handed typing words).

## 2020-04-05 NOTE — Therapy (Signed)
[facility] [address] [place], [place], [address] Phone: [phone]   Fax:  [phone]  Occupational Therapy Treatment  Patient Details  Name: Hannah [name] MRN: [id number] Date of Birth: [birth year] Referring Provider (OT): Dr. Dorcas Carrow. Marcello Moores   Encounter Date: 04/05/2020  OT End of Session - 04/05/20 1322    Visit Number  2    Number of Visits  17    Date for OT Re-Evaluation  05/02/20    Authorization Type  [employer] (?primary):  $40 copay each discipline, auth required after 12th visit.  [place] 2021: $25 copay (1 per day), 20 visit limit OT (each), no auth required    Authorization - Number of Visits  2    Progress Note Due on Visit  12    OT Start Time  1321    OT Stop Time  1405    OT Time Calculation (min)  44 min    Activity Tolerance  Patient tolerated treatment well    Behavior During Therapy  WFL for tasks assessed/performed       Past Medical History:  Diagnosis Date  . Cervical spondylolysis 12/15/2019   Skeleton: Mild cervical spondylosis C6-7. Incidental find.   . Left ankle sprain 10/07/2011  . Seizure ([place])    partial   . Tick bite 02/2020    Past Surgical History:  Procedure Laterality Date  . APPLICATION OF CRANIAL NAVIGATION Left 02/16/2020   Procedure: APPLICATION OF CRANIAL NAVIGATION;  Surgeon: Vallarie Mare, MD;  Location: [facility];  Service: Neurosurgery;  Laterality: Left;  . CRANIOTOMY Left 02/16/2020   Procedure: LEFT FRONTAL CRANIOTOMY FOR BRAIN TUMOR;  Surgeon: Vallarie Mare, MD;  Location: [facility];  Service: Neurosurgery;  Laterality: Left;  . FOOT SURGERY Left    bone spur - local anesthesia  . TONSILECTOMY/ADENOIDECTOMY WITH MYRINGOTOMY     tonsils and adenoids out only  . WISDOM TOOTH EXTRACTION      There were no vitals filed for this visit.  Subjective Assessment - 04/05/20 1321    Subjective   MRI 6/11, MD appt 6/14, and 6/15 surgeon appt.  Pt  reports that shuffling cards has improved and so has typing.  Pt reports upcoming appt regarding next treatment (whether she will need chemo or radiation).--pt understandably tearful and worried about affects of these treatments.    Pertinent History  glioblastoma s/p craniotomy for resection 02/16/20, hx of seizure    Patient Stated Goals  to be able to type with R hand to go back to work, return to playing tennis, be able to use RUE to push up from a seated position (beach chair), kayak    Currently in Pain?  No/denies        Typing test:  28 wpm with 98% accuracy.    Pt reports difficulty with counting while dealing and spelling words while writing.  Discussed adding cognitive components to coordination tasks to help with this.  Writing with good legibility, but needed incr time and reports fatigue.  Trial and issued tan foam grip which may help with fatigue.    OT Education - 04/05/20 1504    Education Details  Coordination HEP and R-handed typing words    Person(s) Educated  Patient    Methods  Explanation;Demonstration;Verbal cues;Handout    Comprehension  Verbalized understanding;Returned demonstration;Verbal cues required       OT Short Term Goals - 03/03/20 1413      OT SHORT TERM  GOAL #1   Title  Pt will be independent with initial HEP.--check STGs 04/02/20    Time  4    Period  Weeks    Status  New      OT SHORT TERM GOAL #2   Title  Pt will be able to eat with RUE 100% of the time including cutting meat.    Baseline  4    Period  Weeks    Status  New      OT SHORT TERM GOAL #3   Title  Pt will improve R hand coordination for ADLs as shown by improving time on 9-hole peg test by at least 8sec.    Baseline  33.8sec    Time  4    Period  Weeks    Status  New      OT SHORT TERM GOAL #4   Title  Pt will demo at least 38lbs R grip strength to assist in opening containers.    Baseline  30.4lbs    Time  4    Period  Weeks    Status  New      OT SHORT TERM GOAL #5    Title  Pt will be able to type at least 35wpm in prep for return to work.    Baseline  22wpm    Time  4    Period  Weeks    Status  New      OT SHORT TERM GOAL #6   Title  Pt will be able to shuffle cards for leisure activities.    Time  4    Period  Weeks    Status  New        OT Long Term Goals - 03/03/20 1417      OT LONG TERM GOAL #1   Title  Pt will be independent with updated HEP.--check LTGs 05/03/20    Time  8    Period  Weeks    Status  New      OT LONG TERM GOAL #2   Title  Pt will demo at least 50lbs R grip strength for leisure tasks.    Baseline  30.4lbs    Time  8    Period  Weeks    Status  New      OT LONG TERM GOAL #3   Title  Pt will be able to type at least 45 wpm in prep for return to work.    Time  8    Period  Weeks    Status  New      OT LONG TERM GOAL #4   Title  Pt will report ability to use RUE as dominat UE for IADL tasks at least 95% of the time.    Baseline  8    Period  Weeks    Status  New      OT LONG TERM GOAL #5   Title  Pt will be able to use RUE for simulated leisure tasks for at least 25 min without significant rest break.    Baseline  8    Period  Weeks    Status  New      OT LONG TERM GOAL #6   Title  Pt will be able to style hair (braid, pony tail) mod I.    Time  8    Period  Weeks    Status  New      OT LONG TERM GOAL #7   Title  Pt will be able to write at least 3 sentences within 39min with no reports of pain in prep to return to work tasks.    Time  8    Period  Weeks    Status  New            Plan - 04/05/20 1324    Clinical Impression Statement  Pt is progressing towards goals with improving RUE coordination and typing.  Pt verbalized understanding of initial HEP after education provided.    OT Occupational Profile and History  Detailed Assessment- Review of Records and additional review of physical, cognitive, psychosocial history related to current functional performance    Occupational performance  deficits (Please refer to evaluation for details):  ADL's;IADL's    Body Structure / Function / Physical Skills  ADL;Dexterity;IADL;Balance;Strength;FMC;Coordination;UE functional use;Decreased knowledge of use of DME;Pain    Rehab Potential  Good    Clinical Decision Making  Limited treatment options, no task modification necessary    Comorbidities Affecting Occupational Performance:  None    Modification or Assistance to Complete Evaluation   No modification of tasks or assist necessary to complete eval    OT Frequency  2x / week    OT Duration  8 weeks   +eval   OT Treatment/Interventions  Self-care/ADL training;Moist Heat;Aquatic Therapy;DME and/or AE instruction;Therapeutic activities;Therapeutic exercise;Fluidtherapy;Visual/perceptual remediation/compensation;Passive range of motion;Neuromuscular education;Cryotherapy;Energy conservation;Manual Therapy;Patient/family education    Plan  educate in putty HEP, divided attention with coordination    Consulted and Agree with Plan of Care  Patient       Patient will benefit from skilled therapeutic intervention in order to improve the following deficits and impairments:   Body Structure / Function / Physical Skills: ADL, Dexterity, IADL, Balance, Strength, FMC, Coordination, UE functional use, Decreased knowledge of use of DME, Pain       Visit Diagnosis: Muscle weakness (generalized)  Unsteadiness on feet  Dizziness and giddiness  Other abnormalities of gait and mobility  Other lack of coordination    Problem List Patient Active Problem List   Diagnosis Date Noted  . GBM (glioblastoma multiforme) ([place])   . Steroid-induced hyperglycemia   . Seizures ([place])   . Leucocytosis   . Bradycardia   . Status post craniotomy 02/16/2020  . Tick bite 02/08/2020  . History of recent hospitalization 12/21/2019  . Seizure disorder ([place]) 12/21/2019  . Hypokalemia 12/21/2019  . Long-term use of high-risk medication 12/21/2019  . Frontal  mass of brain 12/16/2019  . Family history of colon cancer - father 26 05/26/2015  . Colon cancer screening 03/10/2015  . Vitamin D deficiency 03/10/2015    [facility] 04/05/2020, 3:09 PM  [facility] [address] [place], [place], [address] Phone: [phone]   Fax:  [phone]  Name: Hannah [name] MRN: [id number] Date of Birth: 03-Dec-1960   Vianne Bulls, OTR/L [facility] [address] [place], [place]  [address] [phone] phone [phone] 04/05/20 3:11 PM

## 2020-04-06 ENCOUNTER — Ambulatory Visit: Payer: No Typology Code available for payment source | Admitting: Physical Therapy

## 2020-04-06 DIAGNOSIS — R2681 Unsteadiness on feet: Secondary | ICD-10-CM

## 2020-04-06 DIAGNOSIS — R278 Other lack of coordination: Secondary | ICD-10-CM

## 2020-04-06 DIAGNOSIS — R2689 Other abnormalities of gait and mobility: Secondary | ICD-10-CM

## 2020-04-06 DIAGNOSIS — M6281 Muscle weakness (generalized): Secondary | ICD-10-CM

## 2020-04-06 DIAGNOSIS — C711 Malignant neoplasm of frontal lobe: Secondary | ICD-10-CM | POA: Diagnosis not present

## 2020-04-06 NOTE — Patient Instructions (Signed)
°  Aquatic Therapy: What to Expect! ° °Where:  °[facility] °[address] °[place], [place]  [address] °[phone] ° °NOTE:  You will receive an automated phone message reminding you of your appointment and it will say the appointment is at the Rehab Center on [address]  We are working to fix this- just know that you will meet us at the pool! ° °How to Prepare: °• Please make sure you drink 8 ounces of water about one hour prior to your pool session °• A caregiver MUST attend the entire session with the patient.  The caregiver will be responsible for assisting with dressing as well as any toileting needs.  If the patient will be doing a home program this should likely be the person who will assist as well.  °• Patients must wear either their street shoes or pool shoes until they are ready to enter the pool with the therapist.  Patients must also wear either street shoes or pool shoes once exiting the pool to walk to the locker room.  This will helps us prevent slips and falls.  °• Please arrive 15 minutes early to prepare for your pool therapy session °• Sign in at the front desk on the clipboard marked for [place] °• You may use the locker rooms on your right and then enter directly into the recreation pool (NOT the competition pool) °• Please make sure to attend to any toileting needs prior to entering the pool °• Please be dressed in your swim suit and on the pool deck at least 5 minutes before your appointment °• Once on the pool deck your therapist will ask you to sign the Patient  Consent and Assignment of Benefits form °• Your therapist may take your blood pressure prior to, during and after your session if indicated ° °About the pool  and parking: °1. Entering the pool °Your therapist will assist you; there are multiple ways to enter including stairs with railings, a walk in ramp, a roll in chair and a mechanical lift. Your therapist will determine the most appropriate way for  you. °2. Water temperature is usually between 86-87 degrees °3. There may be other swimmers in the pool at the same time °4. Parking is free: if you arrive and there is a parking attendant please inform them you are there for therapy and do not pay to park. Handicapped parking is located at the front entrance. ° °Contact Info:     Appointments: °[facility]  All sessions are 45 minutes   °[address]  [address]     Please call the [facility] if   °[place], [place]   [address]    you need to cancel or reschedule an appointment.  °[phone]     ° ° ° ° °

## 2020-04-07 ENCOUNTER — Encounter: Payer: Self-pay | Admitting: Physical Therapy

## 2020-04-07 ENCOUNTER — Encounter: Payer: No Typology Code available for payment source | Admitting: Occupational Therapy

## 2020-04-07 NOTE — Therapy (Signed)
[facility] [address] [place], [place], [address] Phone: [phone]   Fax:  [phone]  Physical Therapy Treatment  Patient Details  Name: Hannah [name] MRN: [id number] Date of Birth: 02-10-1961 Referring Provider (PT): Duffy Rhody, MD   Encounter Date: 04/06/2020  PT End of Session - 04/07/20 1540    Visit Number  8    Number of Visits  21    Date for PT Re-Evaluation  04/25/20    Authorization Type  [name]    PT Start Time  1705    PT Stop Time  1752    PT Time Calculation (min)  47 min    Activity Tolerance  Patient tolerated treatment well   some increase in dizziness with certain tasks   Behavior During Therapy  [facility] for tasks assessed/performed       Past Medical History:  Diagnosis Date  . Cervical spondylolysis 12/15/2019   Skeleton: Mild cervical spondylosis C6-7. Incidental find.   . Left ankle sprain 10/07/2011  . Seizure ([place])    partial   . Tick bite 02/2020    Past Surgical History:  Procedure Laterality Date  . APPLICATION OF CRANIAL NAVIGATION Left 02/16/2020   Procedure: APPLICATION OF CRANIAL NAVIGATION;  Surgeon: Vallarie Mare, MD;  Location: [facility];  Service: Neurosurgery;  Laterality: Left;  . CRANIOTOMY Left 02/16/2020   Procedure: LEFT FRONTAL CRANIOTOMY FOR BRAIN TUMOR;  Surgeon: Vallarie Mare, MD;  Location: [facility];  Service: Neurosurgery;  Laterality: Left;  . FOOT SURGERY Left    bone spur - local anesthesia  . TONSILECTOMY/ADENOIDECTOMY WITH MYRINGOTOMY     tonsils and adenoids out only  . WISDOM TOOTH EXTRACTION      There were no vitals filed for this visit.  Subjective Assessment - 04/06/20 1711    Subjective  Pt reports she gets tired really easily; wants to be able to ride her bike again. Pt states coordination is her biggest problem (with LLE weaker than RLE)     Pertinent History  No signitifcant history prior to GBM s/p frontal craniotomy 4/14    Patient Stated Goals  "I want to get back to running, playing tennis, hiking, biking.  I want to return to work"    Currently in Pain?  No/denies                             Balance Exercises - 04/07/20 0001      Balance Exercises: Standing   Standing Eyes Closed  Narrow base of support (BOS);Wide (BOA);Head turns;Foam/compliant surface;5 reps    Gait with Head Turns  Forward;1 rep   30'   Marching  Solid surface;Head turns;Static   marching with lifting opposite UE for coordination    Other Standing Exercises  braiding inside // bars 4 reps - no UE support -     Other Standing Exercises Comments  pt performed LLE hip and knee flexion - knee extension with flexion with Lt foot returned to starting position on floor - 5 reps with minimal UE support for improved coordination and SLS on LLE       Pt performed step up exercise onto 2nd step (skipped the 1st step) 10 reps each leg - without UE support for improved  Balance and LE strengthening  Head turns standing  on pillows - EO and then with EC - horizontal and vertical 10 reps each with SBA     PT Short Term Goals - 04/07/20 1546      PT SHORT TERM GOAL #1   Title  Pt will be indepenent with initial HEP in order to indicate improved functional mobility and decreased fall risk.  (Target Date: 03/26/20)    Time  4    Period  Weeks    Status  Achieved    Target Date  03/26/20      PT SHORT TERM GOAL #2   Title  Pt will improve 5TSS to </=13 secs without UE support in order to indicate improved functional strength.    Baseline  14    Time  4    Period  Weeks    Status  Partially Met      PT SHORT TERM GOAL #3   Title  Pt will improve FGA to >/=21/30 in order to indicate decreased fall risk.    Baseline  25/30    Time  4    Period  Weeks    Status  Achieved      PT SHORT TERM GOAL #4   Title  Pt will improve gait  speed to >/=4.00 ft/sec in order to indicate more normal gait speed.    Baseline  4.3 ft/sec    Time  4    Period  Weeks    Status  Achieved      PT SHORT TERM GOAL #5   Title  Will participate in formal vestibular assessment and LTGs to be written as appropriate.    Time  4    Period  Weeks    Status  Achieved      PT SHORT TERM GOAL #6   Title  Pt will negotiate up/down 12 steps with single rail at mod I level (reciprocal pattern) in order to negotiate in home safely.    Baseline  performed 12 alternating sequence no rails    Time  4    Period  Weeks    Status  Achieved        PT Long Term Goals - 04/07/20 1546      PT LONG TERM GOAL #1   Title  Pt will be independent with final HEP in order to indicate decreased fall risk and improved functional mobility (Target Date: 04/25/20)    Time  8    Period  Weeks    Status  New      PT LONG TERM GOAL #2   Title  Pt will perform 5TSS </=11 secs in order to indicate improved functional strength.    Time  8    Period  Weeks    Status  New      PT LONG TERM GOAL #3   Title  Pt will improve FGA to >/=25/30 in order to indicate decreased fall risk.    Time  8    Period  Weeks    Status  New      PT LONG TERM GOAL #4   Title  Pt will ambulate >1000' over varying outdoor surfaces at mod I level while scanning environment without LOB in order to indicate safe community mobility.    Time  8    Period  Weeks    Status  New      PT LONG TERM GOAL #5   Title  Pt will demonstrate ability to begin jogging, hiking, and biking activities  at S level in order to indicate return to leisure activity.    Time  8    Period  Weeks    Status  New      PT LONG TERM GOAL #6   Title  Pt will negotiate up/down 12 steps without rail in reciprocal pattern while carrying items with BUEs to simulate return to ADLs.    Time  8    Period  Weeks    Status  New      PT LONG TERM GOAL #7   Title  Patient will report no dizziness with bed mobility,  quick head and body turns, and bending down to the ground    Time  8    Period  Weeks    Status  New            Plan - 04/06/20 1708    Clinical Impression Statement  Pt continues to have some unsteadiness & postural instablity with activities with dynamic standing balance activities with EC - indicative of decreased vestibular input in maintaining balance.  LLE noted to be slightly weaker than RLE    Personal Factors and Comorbidities  Comorbidity 1;Social Background;Profession;Fitness;Transportation    Comorbidities  craniotomy    Examination-Activity Limitations  Caring for Others;Carry;Lift;Locomotion Level;Reach Overhead;Stairs;Stand    Examination-Participation Restrictions  Community Activity;Driving;Yard Work;Meal Prep    Stability/Clinical Decision Making  Evolving/Moderate complexity    Rehab Potential  Excellent    PT Frequency  Other (comment)   then 2x/wk for 4 weeks   PT Duration  Other (comment)   then 2x/wk for 4 weeks   PT Treatment/Interventions  ADLs/Self Care Home Management;Aquatic Therapy;Gait training;Stair training;Functional mobility training;Therapeutic activities;Balance training;Therapeutic exercise;Neuromuscular re-education;Cognitive remediation;Patient/family education;Passive range of motion;Vestibular    PT Next Visit Plan  Pt not tried yoga yet - tried kick boxing and it was challenging due to the coordination - continue to work on large step ups for hiking, balance with eyes closed, bending down to ground, endurance training    Consulted and Agree with Plan of Care  Patient       Patient will benefit from skilled therapeutic intervention in order to improve the following deficits and impairments:  Abnormal gait, Decreased activity tolerance, Decreased balance, Decreased cognition, Decreased coordination, Decreased endurance, Decreased mobility, Decreased strength, Dizziness, Impaired sensation, Impaired UE functional use, Postural dysfunction  Visit  Diagnosis: Muscle weakness (generalized)  Unsteadiness on feet  Other abnormalities of gait and mobility  Other lack of coordination     Problem List Patient Active Problem List   Diagnosis Date Noted  . GBM (glioblastoma multiforme) ([facility])   . Steroid-induced hyperglycemia   . Seizures ([facility])   . Leucocytosis   . Bradycardia   . Status post craniotomy 02/16/2020  . Tick bite 02/08/2020  . History of recent hospitalization 12/21/2019  . Seizure disorder ([facility]) 12/21/2019  . Hypokalemia 12/21/2019  . Long-term use of high-risk medication 12/21/2019  . Frontal mass of brain 12/16/2019  . Family history of colon cancer - father 20 05/26/2015  . Colon cancer screening 03/10/2015  . Vitamin D deficiency 03/10/2015    Alda Lea, PT 04/07/2020, 3:48 PM  [facility] [address] [place], [place], [address] Phone: [phone]   Fax:  [phone]  Name: KARRISSA PARCHMENT MRN: [id number] Date of Birth: December 19, 1960

## 2020-04-10 ENCOUNTER — Encounter: Payer: Self-pay | Admitting: Physical Therapy

## 2020-04-10 ENCOUNTER — Other Ambulatory Visit: Payer: Self-pay

## 2020-04-10 ENCOUNTER — Ambulatory Visit: Payer: No Typology Code available for payment source | Admitting: Physical Therapy

## 2020-04-10 DIAGNOSIS — R2689 Other abnormalities of gait and mobility: Secondary | ICD-10-CM

## 2020-04-10 DIAGNOSIS — R278 Other lack of coordination: Secondary | ICD-10-CM

## 2020-04-10 DIAGNOSIS — M6281 Muscle weakness (generalized): Secondary | ICD-10-CM

## 2020-04-10 DIAGNOSIS — C711 Malignant neoplasm of frontal lobe: Secondary | ICD-10-CM | POA: Diagnosis not present

## 2020-04-10 DIAGNOSIS — R2681 Unsteadiness on feet: Secondary | ICD-10-CM

## 2020-04-11 NOTE — Therapy (Signed)
[facility] [address] [place], [place], [address] Phone: [phone]   Fax:  [phone]  Physical Therapy Treatment  Patient Details  Name: Hannah [name] MRN: [id number] Date of Birth: 02-20-1961 Referring Provider (PT): Duffy Rhody, MD   Encounter Date: 04/10/2020  PT End of Session - 04/10/20 1846    Visit Number  9    Number of Visits  21    Date for PT Re-Evaluation  04/25/20    Authorization Type  [name]    PT Start Time  1500    PT Stop Time  1545    PT Time Calculation (min)  45 min    Activity Tolerance  Patient tolerated treatment well   some increase in dizziness with certain tasks   Behavior During Therapy  [facility] for tasks assessed/performed       Past Medical History:  Diagnosis Date  . Cervical spondylolysis 12/15/2019   Skeleton: Mild cervical spondylosis C6-7. Incidental find.   . Left ankle sprain 10/07/2011  . Seizure ([facility])    partial   . Tick bite 02/2020    Past Surgical History:  Procedure Laterality Date  . APPLICATION OF CRANIAL NAVIGATION Left 02/16/2020   Procedure: APPLICATION OF CRANIAL NAVIGATION;  Surgeon: Vallarie Mare, MD;  Location: [facility];  Service: Neurosurgery;  Laterality: Left;  . CRANIOTOMY Left 02/16/2020   Procedure: LEFT FRONTAL CRANIOTOMY FOR BRAIN TUMOR;  Surgeon: Vallarie Mare, MD;  Location: [facility];  Service: Neurosurgery;  Laterality: Left;  . FOOT SURGERY Left    bone spur - local anesthesia  . TONSILECTOMY/ADENOIDECTOMY WITH MYRINGOTOMY     tonsils and adenoids out only  . WISDOM TOOTH EXTRACTION      There were no vitals filed for this visit.  Subjective Assessment - 04/10/20 1845    Subjective  Is excited to start aquatics.  Denies any changes since last visit.  Still having issues with speech and fine motor skills.    Pertinent History  No  signitifcant history prior to GBM s/p frontal craniotomy 4/14    Patient Stated Goals  "I want to get back to running, playing tennis, hiking, biking.  I want to return to work"    Currently in Pain?  No/denies       Aquatic therapy at [facility] - pool temp. 87.4 degrees  Patient seen for aquatic therapy today. Treatment took place in water 3.5-4 feet deep depending upon activity. Pt entered the pool via steps with use of hand rails with supervision.  Pt performed bil. hamstring and heel cord stretch - runner's stretch 30 sec hold x 1 rep; heel cord stretch with toes/forefoot on pool wall 30 sec hold x 1 rep  Pt performed water walking forwards, backwards and sideways 2 reps approx.70mcross pool with cues for reciprocal arm movement; Had pt place UE's under water for increased UE resistance for strengthening.  Sideways stepping with squats263m2 reps with UE bar bells for resistance  Pt performed jogging2063m reps across pool with cues for reciprocal arm movement. Brief rest break between each rep.  Squats bil LE x 10 reps then single leg x 10 reps with UE support of pool edge.  Braiding 83m29m reps.  Forward, backward and side step weight shifting/stepping with UE movement x 15 reps each side and each direction.  Cues for technique  SLS x 10 sec x 3 reps each with significant LOB.  Decreased BOS with eyes closed x 10 sec x 2 sets.  Same position with eyes open and head turns/nods.  Ai Chi posture of soothing, enclosing and gathering x 15 reps each.    PT Short Term Goals - 04/07/20 1546      PT SHORT TERM GOAL #1   Title  Pt will be indepenent with initial HEP in order to indicate improved functional mobility and decreased fall risk.  (Target Date: 03/26/20)    Time  4    Period  Weeks    Status  Achieved    Target Date  03/26/20      PT SHORT TERM GOAL #2   Title  Pt will improve 5TSS to </=13 secs without UE support in order to indicate improved  functional strength.    Baseline  14    Time  4    Period  Weeks    Status  Partially Met      PT SHORT TERM GOAL #3   Title  Pt will improve FGA to >/=21/30 in order to indicate decreased fall risk.    Baseline  25/30    Time  4    Period  Weeks    Status  Achieved      PT SHORT TERM GOAL #4   Title  Pt will improve gait speed to >/=4.00 ft/sec in order to indicate more normal gait speed.    Baseline  4.3 ft/sec    Time  4    Period  Weeks    Status  Achieved      PT SHORT TERM GOAL #5   Title  Will participate in formal vestibular assessment and LTGs to be written as appropriate.    Time  4    Period  Weeks    Status  Achieved      PT SHORT TERM GOAL #6   Title  Pt will negotiate up/down 12 steps with single rail at mod I level (reciprocal pattern) in order to negotiate in home safely.    Baseline  performed 12 alternating sequence no rails    Time  4    Period  Weeks    Status  Achieved        PT Long Term Goals - 04/07/20 1546      PT LONG TERM GOAL #1   Title  Pt will be independent with final HEP in order to indicate decreased fall risk and improved functional mobility (Target Date: 04/25/20)    Time  8    Period  Weeks    Status  New      PT LONG TERM GOAL #2   Title  Pt will perform 5TSS </=11 secs in order to indicate improved functional strength.    Time  8    Period  Weeks    Status  New      PT LONG TERM GOAL #3   Title  Pt will improve FGA to >/=25/30 in order to indicate decreased fall risk.    Time  8    Period  Weeks    Status  New      PT LONG TERM GOAL #4   Title  Pt will ambulate >1000' over varying outdoor surfaces at mod I level while scanning environment without LOB in order to indicate safe community mobility.    Time  8    Period  Weeks    Status  New      PT LONG TERM GOAL #5   Title  Pt will demonstrate ability to begin jogging, hiking, [name]    Time  8     Period  Weeks    Status  New      PT LONG TERM GOAL #6   Title  Pt will negotiate up/down 12 steps without rail in reciprocal pattern while carrying items with BUEs to simulate return to ADLs.    Time  8    Period  Weeks    Status  New      PT LONG TERM GOAL #7   Title  Patient will report no dizziness with bed mobility, quick head and body turns, and bending down to the ground    Time  8    Period  Weeks    Status  New            Plan - 04/10/20 1846    Clinical Impression Statement  Pt tolerated first aquatic session well and motivated to return to prior level of function.  Pt challenged with balance activities along with jogging and water resistance.  Cont PT per POC.    Personal Factors and Comorbidities  Comorbidity 1;Social Background;Profession;Fitness;Transportation    Comorbidities  craniotomy    Examination-Activity Limitations  Caring for Others;Carry;Lift;Locomotion Level;Reach Overhead;Stairs;Stand    Examination-Participation Restrictions  Community Activity;Driving;Yard Work;Meal Prep    Stability/Clinical Decision Making  Evolving/Moderate complexity    Rehab Potential  Excellent    PT Frequency  Other (comment)   then 2x/wk for 4 weeks   PT Duration  Other (comment)   then 2x/wk for 4 weeks   PT Treatment/Interventions  ADLs/Self Care Home Management;Aquatic Therapy;Gait training;Stair training;Functional mobility training;Therapeutic activities;Balance training;Therapeutic exercise;Neuromuscular re-education;Cognitive remediation;Patient/family education;Passive range of motion;Vestibular    PT Next Visit Plan  Pt not tried yoga yet - tried kick boxing and it was challenging due to the coordination - continue to work on large step ups for hiking, balance with eyes closed, bending down to ground, endurance training    Consulted and Agree with Plan of Care  Patient       Patient will benefit from skilled therapeutic intervention in order to improve the  following deficits and impairments:  Abnormal gait, Decreased activity tolerance, Decreased balance, Decreased cognition, Decreased coordination, Decreased endurance, Decreased mobility, Decreased strength, Dizziness, Impaired sensation, Impaired UE functional use, Postural dysfunction  Visit Diagnosis: Muscle weakness (generalized)  Unsteadiness on feet  Other abnormalities of gait and mobility  Other lack of coordination     Problem List Patient Active Problem List   Diagnosis Date Noted  . GBM (glioblastoma multiforme) ([facility])   . Steroid-induced hyperglycemia   . Seizures ([facility])   . Leucocytosis   . Bradycardia   . Status post craniotomy 02/16/2020  . Tick bite 02/08/2020  . History of recent hospitalization 12/21/2019  . Seizure disorder ([facility]) 12/21/2019  . Hypokalemia 12/21/2019  . Long-term use of high-risk medication 12/21/2019  . Frontal mass of brain 12/16/2019  . Family history of colon cancer - father 25 05/26/2015  . Colon cancer screening 03/10/2015  . Vitamin D deficiency 03/10/2015   Narda Bonds, PTA [facility] 04/11/20 1:08 PM Phone: [phone] Fax: [facility] [address] [place], [place], [address] Phone: [phone]   Fax:  [phone]  Name: Hannah [name]  MRN: [id number] Date of Birth: September 29, 1961

## 2020-04-12 ENCOUNTER — Other Ambulatory Visit: Payer: Self-pay

## 2020-04-12 ENCOUNTER — Ambulatory Visit: Payer: No Typology Code available for payment source | Admitting: Occupational Therapy

## 2020-04-12 ENCOUNTER — Encounter: Payer: Self-pay | Admitting: Occupational Therapy

## 2020-04-12 DIAGNOSIS — R278 Other lack of coordination: Secondary | ICD-10-CM

## 2020-04-12 DIAGNOSIS — I69851 Hemiplegia and hemiparesis following other cerebrovascular disease affecting right dominant side: Secondary | ICD-10-CM

## 2020-04-12 DIAGNOSIS — C711 Malignant neoplasm of frontal lobe: Secondary | ICD-10-CM | POA: Diagnosis not present

## 2020-04-12 DIAGNOSIS — M6281 Muscle weakness (generalized): Secondary | ICD-10-CM

## 2020-04-12 NOTE — Therapy (Addendum)
[facility] [address] [place], [place], [address] Phone: [phone]   Fax:  [phone]  Occupational Therapy Treatment  Patient Details  Name: Hannah [name] MRN: [id number] Date of Birth: June 19, 1961 Referring Provider (OT): Dr. Dorcas Carrow. Marcello Moores   Encounter Date: 04/12/2020  OT End of Session - 04/12/20 1415    Visit Number  3    Number of Visits  17    Date for OT Re-Evaluation  05/02/20    Authorization Type   [employer] (?primary):  $40 copay each discipline, auth required after 12th visit.  [employer] 2021: $25 copay (1 per day), 20 visit limit OT (each), no auth required    Authorization - Visit Number  3    Authorization - Number of Visits  20    Progress Note Due on Visit  12    OT Start Time  1318    OT Stop Time  1406    OT Time Calculation (min)  48 min    Activity Tolerance  Patient tolerated treatment well    Behavior During Therapy  WFL for tasks assessed/performed       Past Medical History:  Diagnosis Date  . Cervical spondylolysis 12/15/2019   Skeleton: Mild cervical spondylosis C6-7. Incidental find.   . Left ankle sprain 10/07/2011  . Seizure ([place])    partial   . Tick bite 02/2020    Past Surgical History:  Procedure Laterality Date  . APPLICATION OF CRANIAL NAVIGATION Left 02/16/2020   Procedure: APPLICATION OF CRANIAL NAVIGATION;  Surgeon: Vallarie Mare, MD;  Location: [facility];  Service: Neurosurgery;  Laterality: Left;  . CRANIOTOMY Left 02/16/2020   Procedure: LEFT FRONTAL CRANIOTOMY FOR BRAIN TUMOR;  Surgeon: Vallarie Mare, MD;  Location: [facility];  Service: Neurosurgery;  Laterality: Left;  . FOOT SURGERY Left    bone spur - local anesthesia  . TONSILECTOMY/ADENOIDECTOMY WITH MYRINGOTOMY     tonsils and adenoids out only  . WISDOM TOOTH EXTRACTION      There were no vitals filed for this visit.  Subjective Assessment - 04/12/20 1321    Subjective   Pt reports that she  has been doing yoga and triming bushes.   Pt reports that she was able to work in yard for about 1.5 hours and is lifting weights (biceps/triceps).  Pt repors incr ease with shuffling and is now able to put hair in ponytail and braid (but has difficulty/needs assist to put elastic on end of braid)    Pertinent History  glioblastoma s/p craniotomy for resection 02/16/20, hx of seizure    Patient Stated Goals  to be able to type with R hand to go back to work, return to playing tennis, be able to use RUE to push up from a seated position (beach chair), kayak    Currently in Pain?  No/denies        Recommended that pt try bringing end of braid around to the front to put band around it.    OT Education - 04/12/20 1436    Education Details  Green Putty HEP; Nyoka Cowden Theraband HEP; "juggling 2 balls" for coordination    Person(s) Educated  Patient    Methods  Explanation;Demonstration;Verbal cues;Handout    Comprehension  Verbalized understanding;Returned demonstration       OT Short Term Goals - 04/12/20 1429      OT SHORT TERM GOAL #1   Title  Pt will be independent with initial HEP.--check STGs  04/21/20--extended due to decr frequency    Time  4    Period  Weeks    Status  New      OT SHORT TERM GOAL #2   Title  Pt will be able to eat with RUE 100% of the time including cutting meat.    Baseline  4    Period  Weeks    Status  New      OT SHORT TERM GOAL #3   Title  Pt will improve R hand coordination for ADLs as shown by improving time on 9-hole peg test by at least 8sec.    Baseline  33.8sec    Time  4    Period  Weeks    Status  New      OT SHORT TERM GOAL #4   Title  Pt will demo at least 38lbs R grip strength to assist in opening containers.    Baseline  30.4lbs    Time  4    Period  Weeks    Status  New      OT SHORT TERM GOAL #5   Title  Pt will be able to type at least 35wpm in prep for return to work.    Baseline  22wpm    Time  4    Period  Weeks    Status   On-going   04/05/20;  28wpm     OT SHORT TERM GOAL #6   Title  Pt will be able to shuffle cards for leisure activities.    Time  4    Period  Weeks    Status  New        OT Long Term Goals - 03/03/20 1417      OT LONG TERM GOAL #1   Title  Pt will be independent with updated HEP.--check LTGs 05/03/20    Time  8    Period  Weeks    Status  New      OT LONG TERM GOAL #2   Title  Pt will demo at least 50lbs R grip strength for leisure tasks.    Baseline  30.4lbs    Time  8    Period  Weeks    Status  New      OT LONG TERM GOAL #3   Title  Pt will be able to type at least 45 wpm in prep for return to work.    Time  8    Period  Weeks    Status  New      OT LONG TERM GOAL #4   Title  Pt will report ability to use RUE as dominat UE for IADL tasks at least 95% of the time.    Baseline  8    Period  Weeks    Status  New      OT LONG TERM GOAL #5   Title  Pt will be able to use RUE for simulated leisure tasks for at least 25 min without significant rest break.    Baseline  8    Period  Weeks    Status  New      OT LONG TERM GOAL #6   Title  Pt will be able to style hair (braid, pony tail) mod I.    Time  8    Period  Weeks    Status  New      OT LONG TERM GOAL #7   Title  Pt will be able to  write at least 3 sentences within 25min with no reports of pain in prep to return to work tasks.    Time  8    Period  Weeks    Status  New            Plan - 04/12/20 1416    Clinical Impression Statement  Pt continues to progress with RUE coordination, functional use, and IADL performance.    OT Occupational Profile and History  Detailed Assessment- Review of Records and additional review of physical, cognitive, psychosocial history related to current functional performance    Occupational performance deficits (Please refer to evaluation for details):  ADL's;IADL's    Body Structure / Function / Physical Skills  ADL;Dexterity;IADL;Balance;Strength;FMC;Coordination;UE  functional use;Decreased knowledge of use of DME;Pain    Rehab Potential  Good    Clinical Decision Making  Limited treatment options, no task modification necessary    Comorbidities Affecting Occupational Performance:  None    Modification or Assistance to Complete Evaluation   No modification of tasks or assist necessary to complete eval    OT Frequency  2x / week    OT Duration  8 weeks   +eval   OT Treatment/Interventions  Self-care/ADL training;Moist Heat;Aquatic Therapy;DME and/or AE instruction;Therapeutic activities;Therapeutic exercise;Fluidtherapy;Visual/perceptual remediation/compensation;Passive range of motion;Neuromuscular education;Cryotherapy;Energy conservation;Manual Therapy;Patient/family education    Plan  divided attention with coordination, begin checking STGs (was extended due to decr frequency due to scheduling)--discuss possibly scheduling OT in July due to decr frequency/schedule conflicts    Consulted and Agree with Plan of Care  Patient       Patient will benefit from skilled therapeutic intervention in order to improve the following deficits and impairments:   Body Structure / Function / Physical Skills: ADL, Dexterity, IADL, Balance, Strength, FMC, Coordination, UE functional use, Decreased knowledge of use of DME, Pain       Visit Diagnosis: Other lack of coordination  Muscle weakness (generalized)  Hemiplegia and hemiparesis following other cerebrovascular disease affecting right dominant side [facility])    Problem List Patient Active Problem List   Diagnosis Date Noted  . GBM (glioblastoma multiforme) ([place])   . Steroid-induced hyperglycemia   . Seizures ([place])   . Leucocytosis   . Bradycardia   . Status post craniotomy 02/16/2020  . Tick bite 02/08/2020  . History of recent hospitalization 12/21/2019  . Seizure disorder ([place]) 12/21/2019  . Hypokalemia 12/21/2019  . Long-term use of high-risk medication 12/21/2019  . Frontal mass of brain  12/16/2019  . Family history of colon cancer - father 34 05/26/2015  . Colon cancer screening 03/10/2015  . Vitamin D deficiency 03/10/2015    [facility] 04/12/2020, 2:37 PM  [facility] [address] [place], [place], [address] Phone: [phone]   Fax:  [phone]  Name: HAVA MASSINGALE MRN: [id number] Date of Birth: 1961/05/08   Vianne Bulls, OTR/L [facility] [address] [place], [place]  [address] [phone] phone [phone] 04/12/20 2:37 PM

## 2020-04-12 NOTE — Patient Instructions (Addendum)
   Extension (Assistive Putty)   Roll putty back and forth, being sure to use all fingertips. Repeat 3 times. Do 1-2 sessions per day.  Then pinch as below.   Palmar Pinch Strengthening (Resistive Putty)   Pinch putty between thumb and each fingertip in turn after rolling out     Grip Strengthening (Resistive Putty)   Squeeze putty using thumb and all fingers. Repeat 15 times. Do 1-2 sessions per day.      Finger and Thumb Extension (Resistive Putty)   With thumb and all fingers in center of putty donut, stretch out. Repeat 5-10 times. Do 1-2 sessions per day.     Lateral Pinch Strengthening (Resistive Putty)    Squeeze between thumb and side of each finger in turn. Repeat 10 times. Do 1-2 sessions per day.       "Juggle 2 balls"     SHOULDER: Abduction (Band)    Holding band, raise arm out and up. Keep elbow straight. Do not shrug shoulders. Hold 2 seconds. Use green band. 10 reps per set, work up to 2 sets   FLEXION: Standing - Stable: Resistance Band (Active)    Stand with right arm at side. Against yellow resistance band, lift arm forward and up as high as possible, keeping elbow straight. Complete _1-2 sets of 10 repetitions.     Resisted Horizontal Abduction: Bilateral   Sit or stand, tubing in both hands, palms down and arms out in front. Keeping arms straight, pinch shoulder blades together and stretch arms out. Repeat 10 times per set.  Do 1-2 sessions per day.

## 2020-04-13 ENCOUNTER — Ambulatory Visit: Payer: No Typology Code available for payment source | Admitting: Physical Therapy

## 2020-04-14 ENCOUNTER — Ambulatory Visit
Admission: RE | Admit: 2020-04-14 | Discharge: 2020-04-14 | Disposition: A | Discharge status: Home / Self Care | Payer: No Typology Code available for payment source | Source: Ambulatory Visit | Attending: Internal Medicine | Admitting: Internal Medicine

## 2020-04-14 ENCOUNTER — Other Ambulatory Visit: Payer: Self-pay

## 2020-04-14 ENCOUNTER — Encounter: Payer: No Typology Code available for payment source | Admitting: Occupational Therapy

## 2020-04-14 ENCOUNTER — Telehealth: Payer: Self-pay

## 2020-04-14 DIAGNOSIS — C719 Malignant neoplasm of brain, unspecified: Secondary | ICD-10-CM

## 2020-04-14 MED ORDER — GADOBENATE DIMEGLUMINE 529 MG/ML IV SOLN
14.0000 mL | Freq: Once | INTRAVENOUS | Status: AC | PRN
  Administered 2020-04-14: 14 mL via INTRAVENOUS

## 2020-04-14 NOTE — Telephone Encounter (Signed)
Received call from pt. requesting to know if she can attend her MD visit on Monday with both her husband and daughter. Advised her that at this time she can only be accompanied to MD visit by one visitor, she verbalized understanding.

## 2020-04-17 ENCOUNTER — Inpatient Hospital Stay: Payer: No Typology Code available for payment source

## 2020-04-17 ENCOUNTER — Ambulatory Visit: Payer: No Typology Code available for payment source | Admitting: Physical Therapy

## 2020-04-17 ENCOUNTER — Encounter: Payer: Self-pay | Admitting: Physical Therapy

## 2020-04-17 ENCOUNTER — Inpatient Hospital Stay: Payer: No Typology Code available for payment source | Attending: Internal Medicine | Admitting: Internal Medicine

## 2020-04-17 ENCOUNTER — Other Ambulatory Visit: Payer: Self-pay

## 2020-04-17 VITALS — BP 129/92 | HR 50 | Temp 97.9°F | Resp 20 | Ht 65.0 in | Wt 157.1 lb

## 2020-04-17 DIAGNOSIS — R278 Other lack of coordination: Secondary | ICD-10-CM

## 2020-04-17 DIAGNOSIS — R2689 Other abnormalities of gait and mobility: Secondary | ICD-10-CM

## 2020-04-17 DIAGNOSIS — G40909 Epilepsy, unspecified, not intractable, without status epilepticus: Secondary | ICD-10-CM | POA: Diagnosis not present

## 2020-04-17 DIAGNOSIS — Z800 Family history of malignant neoplasm of digestive organs: Secondary | ICD-10-CM | POA: Insufficient documentation

## 2020-04-17 DIAGNOSIS — C719 Malignant neoplasm of brain, unspecified: Secondary | ICD-10-CM

## 2020-04-17 DIAGNOSIS — C711 Malignant neoplasm of frontal lobe: Secondary | ICD-10-CM | POA: Insufficient documentation

## 2020-04-17 DIAGNOSIS — R2681 Unsteadiness on feet: Secondary | ICD-10-CM

## 2020-04-17 DIAGNOSIS — Z833 Family history of diabetes mellitus: Secondary | ICD-10-CM | POA: Insufficient documentation

## 2020-04-17 DIAGNOSIS — Z79899 Other long term (current) drug therapy: Secondary | ICD-10-CM | POA: Insufficient documentation

## 2020-04-17 DIAGNOSIS — M6281 Muscle weakness (generalized): Secondary | ICD-10-CM

## 2020-04-17 DIAGNOSIS — Z87891 Personal history of nicotine dependence: Secondary | ICD-10-CM | POA: Insufficient documentation

## 2020-04-17 DIAGNOSIS — Z8249 Family history of ischemic heart disease and other diseases of the circulatory system: Secondary | ICD-10-CM | POA: Insufficient documentation

## 2020-04-17 MED ORDER — LEVETIRACETAM 250 MG PO TABS: 250.0000 mg | ORAL_TABLET | Freq: Two times a day (BID) | ORAL | 3 refills | Status: DC

## 2020-04-17 MED FILL — levETIRAcetam 250 MG TABS: 250 | 30 days supply | Qty: 60 | Fill #0

## 2020-04-17 NOTE — Progress Notes (Signed)
[facility] [place], [place] [address] [phone]   Interval Evaluation  Date of Service: 04/17/20 Patient Name: Hannah [name] Patient MRN: [id number] Patient DOB: Aug 26, 1961 Provider: [name], MD  Identifying Statement:  Hannah [name] is a 59 y.o. female with left frontal glioblastoma   Oncologic History: Oncology History  GBM (glioblastoma multiforme) ([place])   Initial Diagnosis   GBM (glioblastoma multiforme) ([facility])   02/17/2020 Surgery   Craniotomy, left frontal resection by Dr. Marcello Moores.  Path is glioblastoma.     Biomarkers:  MGMT Methylated.  IDH 1/2 Wild type.  EGFR Unknown  TERT Unknown   Interval History:  Hannah [name] presents today for further discussion regarding her recent diagnosis of glioblastoma.  She denies new or progressive neurologic deficits aside from modest decline in fluency or word finding.  This is described as more of an "effort" increase than a notable drop off in appreciable fluency. No recurrence of seizures.  No headaches or gait impairment.   H+P (01/20/20) Patient presented one month ago with sudden onset left arm and leg weakness and interrupted speech, [name], [name], stroke eval and CNS imaging which demonstrated a non-enhancing left frontal mass.  At this time she is back to baseline without recurrence of events, taking Keppra 550m twice per day.  No history or seizure or any other neurologic events.  She does describe being sleep deprived prior to seizure event.  She presents today after one month follow up MRI scan.  Medications: Current Outpatient Medications on File Prior to Visit  Medication Sig Dispense Refill  . levETIRAcetam (KEPPRA) 500 MG tablet Take 1 tablet (500 mg total) by mouth 2 (two) times daily. 60 tablet 3  .  HYDROcodone-acetaminophen (NORCO/VICODIN) 5-325 MG tablet Take 1 tablet by mouth every 4 (four) hours as needed for moderate pain. (Patient not taking: Reported on 02/24/2020) 20 tablet 0  . Multiple Vitamin (MULTIVITAMIN ADULT PO) Take 1 tablet by mouth daily.  (Patient not taking: Reported on 04/17/2020)     No current facility-administered medications on file prior to visit.    Allergies: No Known Allergies Past Medical History:  Past Medical History:  Diagnosis Date  . Cervical spondylolysis 12/15/2019   Skeleton: Mild cervical spondylosis C6-7. Incidental find.   . Left ankle sprain 10/07/2011  . [place]    partial   . Tick bite 02/2020   Past Surgical History:  Past Surgical History:  Procedure Laterality Date  . APPLICATION OF CRANIAL NAVIGATION Left 02/16/2020   Procedure: APPLICATION OF CRANIAL NAVIGATION;  Surgeon: TVallarie Mare MD;  [facility]  Service: Neurosurgery;  Laterality: Left;  . CRANIOTOMY Left 02/16/2020   Procedure: LEFT FRONTAL CRANIOTOMY FOR BRAIN TUMOR;  Surgeon: TVallarie Mare MD;  [facility]  Service: Neurosurgery;  Laterality: Left;  . FOOT SURGERY Left    bone spur - local anesthesia  . TONSILECTOMY/ADENOIDECTOMY WITH MYRINGOTOMY     tonsils and adenoids out only  . WISDOM TOOTH EXTRACTION     Social History:  Social History   Socioeconomic History  . Marital status: Married    Spouse name: Not on file  . Number of children: 3  . Years of education: Not on file  . Highest education level: Not on file  Occupational History  .  Not on file  Tobacco Use  . Smoking status: Former Smoker    Years: 2.00    Types: Cigarettes    Quit date: 11/05/1979    Years since quitting: 40.4  . Smokeless tobacco: Never Used  . Tobacco comment: 1/2 pack a week  Vaping Use  . Vaping Use: Never used  Substance and Sexual Activity  . Alcohol use: Yes    Alcohol/week: 0.0 standard drinks    Comment: 1 glass of wine weekly  . Drug use:  No  . Sexual activity: Yes    Birth control/protection: Surgical  Other Topics Concern  . Not on file  Social History Narrative  . Not on file   Social Determinants of Health   Financial Resource Strain:   . Difficulty of Paying Living Expenses:   Food Insecurity:   . Worried About Charity fundraiser in the Last Year:   . Arboriculturist in the Last Year:   Transportation Needs:   . Film/video editor (Medical):   Marland Kitchen Lack of Transportation (Non-Medical):   Physical Activity:   . Days of Exercise per Week:   . Minutes of Exercise per Session:   Stress:   . Feeling of Stress :   Social Connections:   . Frequency of Communication with Friends and Family:   . Frequency of Social Gatherings with Friends and Family:   . Attends Religious Services:   . Active Member of Clubs or Organizations:   . Attends Archivist Meetings:   Marland Kitchen Marital Status:   Intimate Partner Violence:   . Fear of Current or Ex-Partner:   . Emotionally Abused:   Marland Kitchen Physically Abused:   . Sexually Abused:    Family History:  Family History  Problem Relation Age of Onset  . Hypertension Mother   . Stroke Mother   . Diabetes Mother   . Cancer Father 47       colon  . Hypertension Sister   . Colon polyps Sister   . Heart disease Brother 40  . Diabetes Maternal Uncle   . Diabetes Maternal Uncle     Review of Systems: Constitutional: Doesn't report fevers, chills or abnormal weight loss Eyes: Doesn't report blurriness of vision Ears, nose, mouth, throat, and face: Doesn't report sore throat Respiratory: Doesn't report cough, dyspnea or wheezes Cardiovascular: Doesn't report palpitation, chest discomfort  Gastrointestinal:  Doesn't report nausea, constipation, diarrhea GU: Doesn't report incontinence Skin: Doesn't report skin rashes Neurological: Per HPI Musculoskeletal: Doesn't report joint pain Behavioral/Psych: Doesn't report anxiety  Physical Exam: Vitals:   04/17/20 1231  BP:  (!) 129/92  Pulse: (!) 50  Resp: 20  Temp: 97.9 F (36.6 C)  SpO2: 99%   KPS: 80. General: Alert, cooperative, pleasant, in no acute distress Head: Normal EENT: No conjunctival injection or scleral icterus.  Lungs: Resp effort normal Cardiac: Regular rate Abdomen: Non-distended abdomen Skin: No rashes cyanosis or petechiae. Extremities: No clubbing or edema  Neurologic Exam: Mental Status: Awake, alert, attentive to examiner. Oriented to self and environment. Language is fluent with intact comprehension.  Occasional interruptions in fluency.  Cranial Nerves: Visual acuity is grossly normal. Visual fields are full. Extra-ocular movements intact. No ptosis. Face is symmetric Motor: Tone and bulk are normal. Power is full in both arms and legs. Reflexes are symmetric, no pathologic reflexes present.  Sensory: Intact to light touch Gait: Normal.   Labs: I have reviewed the data as listed    Component Value  Date/Time   NA 142 02/17/2020 0136   K 3.9 02/17/2020 0136   CL 109 02/17/2020 0136   CO2 24 02/17/2020 0136   GLUCOSE 142 (H) 02/17/2020 0136   GLUCOSE 85 10/13/2006 1158   BUN 6 02/17/2020 0136   CREATININE 0.78 02/17/2020 0136   CALCIUM 8.8 (L) 02/17/2020 0136   PROT 6.9 12/21/2019 1134   ALBUMIN 4.5 12/21/2019 1134   AST 16 12/21/2019 1134   ALT 14 12/21/2019 1134   ALKPHOS 55 12/21/2019 1134   BILITOT 0.9 12/21/2019 1134   GFRNONAA >60 02/17/2020 0136   GFRAA >60 02/17/2020 0136   Lab Results  Component Value Date   WBC 11.1 (H) 02/17/2020   NEUTROABS 10.4 (H) 02/17/2020   HGB 13.9 02/17/2020   HCT 40.6 02/17/2020   MCV 91.9 02/17/2020   PLT 225 02/17/2020    Imaging:  MR BRAIN W WO CONTRAST  Result Date: 04/15/2020 CLINICAL DATA:  Follow-up left frontal glioblastoma status post resection on 02/17/2020. EXAM: MRI HEAD WITHOUT AND WITH CONTRAST TECHNIQUE: Multiplanar, multiecho pulse sequences of the brain and surrounding structures were obtained without  and with intravenous contrast. CONTRAST:  56m MULTIHANCE GADOBENATE DIMEGLUMINE 529 MG/ML IV SOLN COMPARISON:  02/17/2020 FINDINGS: Brain: Sequelae of left frontoparietal craniotomy are again identified for tumor resection. A resection cavity in the parasagittal posterior left frontal lobe has partially collapsed. A small amount of chronic blood products are noted. There is a small amount of partially curvilinear, partially nodular enhancement within the resection cavity measuring up to 3 mm in thickness. No nodular brain parenchymal enhancement is identified. There is a small amount of T2 FLAIR hyperintensity along the margins of the resection cavity, most notable anterosuperiorly (series 8, image 27) and which is stable to minimally increased. Mild T2 hyperintensity along the posterior aspect of the cavity has decreased. Subtle nonenhancing T2 hyperintensity and slight cortical expansion of the left insula is unchanged (for example series 8, image 17 and series 12, image 18). There is the suggestion of a new 3 mm focus of faint enhancement along the body of the corpus callosum to the right of midline on the sagittal sequence (series 15, image 12), questionably visible on the axial series (series 13, image 86) and not confirmed on the coronal series. There is a small amount of linear T2 hyperintensity in this location as well as subtle T2 hyperintensity more anteriorly along the right cingulate gyrus (series 8, image 20). Elsewhere, small foci of T2 hyperintensity scattered throughout the cerebral white matter bilaterally are unchanged and nonspecific but compatible with mild chronic small vessel ischemic disease. The ventricles are normal in size. No acute infarct, midline shift, or extra-axial fluid collection is identified. Vascular: Major intracranial vascular flow voids are preserved. Skull and upper cervical spine: Left frontoparietal craniotomy. No suspicious marrow lesion. Sinuses/Orbits: Unremarkable  orbits. Paranasal sinuses and mastoid air cells are clear. Other: None. IMPRESSION: 1. Stable to minimally increased nonenhancing T2 hyperintensity along the anterosuperior margin of the left frontal resection cavity for which a small amount of residual tumor is not excluded. 2. Unchanged subtle T2 hyperintensity and expansion of the left insula suspicious for nonenhancing tumor. 3. Suggestion of a new 3 mm focus of faint enhancement along the body of the corpus callosum on the right with mild T2 hyperintensity extending into the right cingulate gyrus. Close attention on follow-up to assess for tumor versus a benign process such as wallerian degeneration of the crossing white matter tracts related to contralateral surgery. Electronically  Signed   By: Logan Bores M.D.   On: 04/15/2020 10:58     Assessment/Plan GBM (glioblastoma multiforme) ([place]) [C71.9]   Janyiah A Mcelveen is clinically stable today.  MRI demonstrates non-enhancing pattern of progression within left insular and right mesial frontal regions concerning for tumor growth and infiltration over the past 2 months.  MRI was discussed at length in brain/spine tumor board meeting this morning.  Today we again discussed her pathology, post-op imaging, clinical features, genomic data now including IDH-1 and MGMT, and overall prognosis.  We continue to be encouraged regarding her clinical status, resection quality, and tumor size/location.   We continue to recommend upfront standard of care therapy for glioblastoma, including IMRT and concurrent Temodar, with increased emphasis given radiographic changes appreciated today.   She will reach out to [place] via phone or mychart with her decision regarding proceeding with radiation and/or chemotherapy.  Keppra may be decreased to 258m BID.  All questions were answered. The patient knows to call the clinic with any problems, questions or concerns. No barriers to learning were detected.  The total time  spent in the encounter was 40 minutes and more than 50% was on counseling and review of test results   ZVentura Sellers MD Medical Director of Neuro-Oncology [facility]06/14/21 3:53 PM

## 2020-04-18 ENCOUNTER — Telehealth: Payer: Self-pay | Admitting: Internal Medicine

## 2020-04-18 ENCOUNTER — Ambulatory Visit: Payer: No Typology Code available for payment source | Admitting: Occupational Therapy

## 2020-04-18 DIAGNOSIS — M6281 Muscle weakness (generalized): Secondary | ICD-10-CM

## 2020-04-18 DIAGNOSIS — R278 Other lack of coordination: Secondary | ICD-10-CM

## 2020-04-18 DIAGNOSIS — I69851 Hemiplegia and hemiparesis following other cerebrovascular disease affecting right dominant side: Secondary | ICD-10-CM

## 2020-04-18 DIAGNOSIS — C711 Malignant neoplasm of frontal lobe: Secondary | ICD-10-CM | POA: Diagnosis not present

## 2020-04-18 NOTE — Therapy (Signed)
[facility] [address] [place], [place], [address] Phone: [phone]   Fax:  [phone]  Occupational Therapy Treatment  Patient Details  Name: Hannah [name] MRN: [id number] Date of Birth: 05-16-1961 Referring Provider (OT): Dr. Dorcas Carrow. Marcello Moores   Encounter Date: 04/18/2020   OT End of Session - 04/18/20 1240    Visit Number 4    Number of Visits 17    Date for OT Re-Evaluation 05/02/20    Authorization Type  [employer] (?primary):  $40 copay each discipline, auth required after 12th visit.  [place] 2021: $25 copay (1 per day), 20 visit limit OT (each), no auth required    Authorization - Visit Number 4    Authorization - Number of Visits 20    Progress Note Due on Visit 12    OT Start Time 1230    OT Stop Time 1315    OT Time Calculation (min) 45 min    Activity Tolerance Patient tolerated treatment well    Behavior During Therapy WFL for tasks assessed/performed           Past Medical History:  Diagnosis Date  . Cervical spondylolysis 12/15/2019   Skeleton: Mild cervical spondylosis C6-7. Incidental find.   . Left ankle sprain 10/07/2011  . Seizure ([place])    partial   . Tick bite 02/2020    Past Surgical History:  Procedure Laterality Date  . APPLICATION OF CRANIAL NAVIGATION Left 02/16/2020   Procedure: APPLICATION OF CRANIAL NAVIGATION;  Surgeon: Vallarie Mare, MD;  Location: [facility];  Service: Neurosurgery;  Laterality: Left;  . CRANIOTOMY Left 02/16/2020   Procedure: LEFT FRONTAL CRANIOTOMY FOR BRAIN TUMOR;  Surgeon: Vallarie Mare, MD;  Location: [facility];  Service: Neurosurgery;  Laterality: Left;  . FOOT SURGERY Left    bone spur - local anesthesia  . TONSILECTOMY/ADENOIDECTOMY WITH MYRINGOTOMY     tonsils and adenoids out only  . WISDOM TOOTH EXTRACTION      There were no vitals filed for this visit.   Subjective Assessment - 04/18/20 1237    Subjective  I just found out they  found a new spot on the brain    Pertinent History glioblastoma s/p craniotomy for resection 02/16/20, hx of seizure    Patient Stated Goals to be able to type with R hand to go back to work, return to playing tennis, be able to use RUE to push up from a seated position (beach chair), kayak    Currently in Pain? No/denies           Pt reports she just received results of latest MRI results which shows enhancement in one area and new spot on brain - see imaging for details. Pt reluctant to take MD's recommendations of chemo and radiation.  Assessed STG's and progress to date - pt has shown improvement in Rt hand grip strength and coordination (see below for details).  Typing test: 26 wpm, 98% accuracy. Pt also practiced typing games (clouds - easy words) with min to mod difficulty and reports having to concentrate more.  Braiding string with min difficulty Rt hand and appeared to have mild apraxia. Pt rotating stress balls Rt hand with min difficulty                       OT Short Term Goals - 04/18/20 1244      OT SHORT TERM GOAL #1   Title Pt will be independent  with initial HEP.--check STGs 04/21/20--extended due to decr frequency    Time 4    Period Weeks    Status Achieved      OT SHORT TERM GOAL #2   Title Pt will be able to eat with RUE 100% of the time including cutting meat.    Baseline 4    Period Weeks    Status Achieved      OT SHORT TERM GOAL #3   Title Pt will improve R hand coordination for ADLs as shown by improving time on 9-hole peg test by at least 8sec.    Baseline 33.8sec    Time 4    Period Weeks    Status On-going   26 sec     OT SHORT TERM GOAL #4   Title Pt will demo at least 38lbs R grip strength to assist in opening containers.    Baseline 30.4lbs    Time 4    Period Weeks    Status Achieved   42 lbs     OT SHORT TERM GOAL #5   Title Pt will be able to type at least 35wpm in prep for return to work.    Baseline 22wpm    Time 4      Period Weeks    Status On-going   04/05/20;  28wpm, 04/18/20: 26 wpm 98% accuracy     OT SHORT TERM GOAL #6   Title Pt will be able to shuffle cards for leisure activities.    Time 4    Period Weeks    Status Achieved             OT Long Term Goals - 03/03/20 1417      OT LONG TERM GOAL #1   Title Pt will be independent with updated HEP.--check LTGs 05/03/20    Time 8    Period Weeks    Status New      OT LONG TERM GOAL #2   Title Pt will demo at least 50lbs R grip strength for leisure tasks.    Baseline 30.4lbs    Time 8    Period Weeks    Status New      OT LONG TERM GOAL #3   Title Pt will be able to type at least 45 wpm in prep for return to work.    Time 8    Period Weeks    Status New      OT LONG TERM GOAL #4   Title Pt will report ability to use RUE as dominat UE for IADL tasks at least 95% of the time.    Baseline 8    Period Weeks    Status New      OT LONG TERM GOAL #5   Title Pt will be able to use RUE for simulated leisure tasks for at least 25 min without significant rest break.    Baseline 8    Period Weeks    Status New      OT LONG TERM GOAL #6   Title Pt will be able to style hair (braid, pony tail) mod I.    Time 8    Period Weeks    Status New      OT LONG TERM GOAL #7   Title Pt will be able to write at least 3 sentences within 16mn with no reports of pain in prep to return to work tasks.    Time 8    Period Weeks  Status New                 Plan - 04/18/20 1336    Clinical Impression Statement Pt has met 4/6 STG's at this time. Pt has improvement in RUE function. Pt reports new spot on brain with latest MRI    Occupational performance deficits (Please refer to evaluation for details): ADL's;IADL's    Body Structure / Function / Physical Skills ADL;Dexterity;IADL;Balance;Strength;FMC;Coordination;UE functional use;Decreased knowledge of use of DME;Pain    Rehab Potential Good    OT Frequency 2x / week    OT Duration 8  weeks    OT Treatment/Interventions Self-care/ADL training;Moist Heat;Aquatic Therapy;DME and/or AE instruction;Therapeutic activities;Therapeutic exercise;Fluidtherapy;Visual/perceptual remediation/compensation;Passive range of motion;Neuromuscular education;Cryotherapy;Energy conservation;Manual Therapy;Patient/family education    Plan divided attention with coordination, bilateral hand coordination, typing games (clouds - animals), Pt only got scheduled 1x in July due to pt's requested afternoon schedule (consider another therapist who can offer later times to get more appts in July)    Consulted and Agree with Plan of Care Patient           Patient will benefit from skilled therapeutic intervention in order to improve the following deficits and impairments:   Body Structure / Function / Physical Skills: ADL, Dexterity, IADL, Balance, Strength, FMC, Coordination, UE functional use, Decreased knowledge of use of DME, Pain       Visit Diagnosis: 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 GBM (glioblastoma multiforme) ([facility])   . Steroid-induced hyperglycemia   . Seizures ([facility])   . Leucocytosis   . Bradycardia   . Status post craniotomy 02/16/2020  . Tick bite 02/08/2020  . History of recent hospitalization 12/21/2019  . Seizure disorder ([facility]) 12/21/2019  . Hypokalemia 12/21/2019  . Long-term use of high-risk medication 12/21/2019  . Frontal mass of brain 12/16/2019  . Family history of colon cancer - father 58 05/26/2015  . Colon cancer screening 03/10/2015  . Vitamin D deficiency 03/10/2015    Carey Bullocks, OTR/L 04/18/2020, 2:36 PM  [facility] [address] [place], [place], [address] Phone: [phone]   Fax:   [phone]  Name: Hannah [name] MRN: [id number] Date of Birth: [birth year]

## 2020-04-18 NOTE — Therapy (Signed)
[facility] [address] [place], [place], [address] Phone: [phone]   Fax:  [phone]  Physical Therapy Treatment  Patient Details  Name: Hannah [name] MRN: [id number] Date of Birth: November 24, 1960 Referring Provider (PT): Duffy Rhody, MD   Encounter Date: 04/17/2020   PT End of Session - 04/17/20 1702    Visit Number 10    Number of Visits 21    Date for PT Re-Evaluation 04/25/20    Authorization Type [name]    PT Start Time 1415    PT Stop Time 1500    PT Time Calculation (min) 45 min    Equipment Utilized During Treatment Other (comment)   buoyancy hand bar bells   Activity Tolerance Patient tolerated treatment well   some increase in dizziness with certain tasks   Behavior During Therapy [facility] for tasks assessed/performed           Past Medical History:  Diagnosis Date  . Cervical spondylolysis 12/15/2019   Skeleton: Mild cervical spondylosis C6-7. Incidental find.   . Left ankle sprain 10/07/2011  . Seizure ([place])    partial   . Tick bite 02/2020    Past Surgical History:  Procedure Laterality Date  . APPLICATION OF CRANIAL NAVIGATION Left 02/16/2020   Procedure: APPLICATION OF CRANIAL NAVIGATION;  Surgeon: Vallarie Mare, MD;  Location: [facility];  Service: Neurosurgery;  Laterality: Left;  . CRANIOTOMY Left 02/16/2020   Procedure: LEFT FRONTAL CRANIOTOMY FOR BRAIN TUMOR;  Surgeon: Vallarie Mare, MD;  Location: [facility];  Service: Neurosurgery;  Laterality: Left;  . FOOT SURGERY Left    bone spur - local anesthesia  . TONSILECTOMY/ADENOIDECTOMY WITH MYRINGOTOMY     tonsils and adenoids out only  . WISDOM TOOTH EXTRACTION      There were no vitals filed for this visit.   Subjective Assessment - 04/17/20 1700    Subjective Pt reports visit with Oncologist earlier today and has a new tumor growing.   Is currently not going to do chemo or radiation but MD is encouraging her to.  Denies any changes with her mobility.    Pertinent History No signitifcant history prior to GBM s/p frontal craniotomy 4/14    Patient Stated Goals "I want to get back to running, playing tennis, hiking, biking.  I want to return to work"    Currently in Pain? No/denies           Aquatic therapy at [facility] - pool temp. 87.4 degrees  Patient seen for aquatic therapy today. Treatment took place in water 4 feet deep. Pt entered/exited the pool via ladder with use of hand rails with supervision.  Pt performed bil. hamstring and heel cord stretch - runner's stretch 30 sec hold x 1 rep; heel cord stretch with toes/forefoot on pool wall 30 sec hold x 1 rep  Pt performed water walking forwards, backwards and sideways 2 reps 40m 2 reps each across pool with cues for reciprocal arm movement; Had pt place UE's under water for increased UE resistance for strengthening.  Sideways stepping with squats268m2 repswith UE bar bells for resistance  Pt performed jogging2526meps across pool with cues for reciprocal arm movement. Brief rest breakbetween each rep.  Squats bil LE x 10 reps then single leg x 10 reps with UE support of pool edge.  Braiding 42m77m reps.  Tandem gait 48mx 2 reps  Decreased BOS with eyes closed x 10 sec x 2 sets. Attemped >10 sec but pt becomes unsteady at approx 15 sec.  Same position with eyes open and head turns/nods.  Pt does report this increases dizziness so did not continue.  Ai Chi posture of soothing,enclosingand gatheringx 15 reps each.  Verbal and visual cues for technique.    PT Short Term Goals - 04/07/20 1546      PT SHORT TERM GOAL #1   Title Pt will be indepenent with initial HEP in order to indicate improved functional mobility and decreased fall risk.  (Target Date: 03/26/20)    Time 4    Period Weeks    Status Achieved    Target Date 03/26/20       PT SHORT TERM GOAL #2   Title Pt will improve 5TSS to </=13 secs without UE support in order to indicate improved functional strength.    Baseline 14    Time 4    Period Weeks    Status Partially Met      PT SHORT TERM GOAL #3   Title Pt will improve FGA to >/=21/30 in order to indicate decreased fall risk.    Baseline 25/30    Time 4    Period Weeks    Status Achieved      PT SHORT TERM GOAL #4   Title Pt will improve gait speed to >/=4.00 ft/sec in order to indicate more normal gait speed.    Baseline 4.3 ft/sec    Time 4    Period Weeks    Status Achieved      PT SHORT TERM GOAL #5   Title Will participate in formal vestibular assessment and LTGs to be written as appropriate.    Time 4    Period Weeks    Status Achieved      PT SHORT TERM GOAL #6   Title Pt will negotiate up/down 12 steps with single rail at mod I level (reciprocal pattern) in order to negotiate in home safely.    Baseline performed 12 alternating sequence no rails    Time 4    Period Weeks    Status Achieved             PT Long Term Goals - 04/07/20 1546      PT LONG TERM GOAL #1   Title Pt will be independent with final HEP in order to indicate decreased fall risk and improved functional mobility (Target Date: 04/25/20)    Time 8    Period Weeks    Status New      PT LONG TERM GOAL #2   Title Pt will perform 5TSS </=11 secs in order to indicate improved functional strength.    Time 8    Period Weeks    Status New      PT LONG TERM GOAL #3   Title Pt will improve FGA to >/=25/30 in order to indicate decreased fall risk.    Time 8    Period Weeks    Status New      PT LONG TERM GOAL #4   Title Pt will ambulate >1000' over varying outdoor surfaces at mod I level while scanning environment without LOB in order to indicate safe community mobility.    Time 8    Period Weeks    Status New      PT LONG TERM GOAL #5   Title Pt will demonstrate ability  to begin jogging, hiking, and  biking activities at S level in order to indicate return to leisure activity.    Time 8    Period Weeks    Status New      PT LONG TERM GOAL #6   Title Pt will negotiate up/down 12 steps without rail in reciprocal pattern while carrying items with BUEs to simulate return to ADLs.    Time 8    Period Weeks    Status New      PT LONG TERM GOAL #7   Title Patient will report no dizziness with bed mobility, quick head and body turns, and bending down to the ground    Time 8    Period Weeks    Status New                 Plan - 04/17/20 1703    Clinical Impression Statement Pt continues to work hard during aquatic sessions.  Was challenged with eyes closed today due to dizziness and feeling off balance.  Cont PT per POC.    Personal Factors and Comorbidities Comorbidity 1;Social Background;Profession;Fitness;Transportation    Comorbidities craniotomy    Examination-Activity Limitations Caring for Others;Carry;Lift;Locomotion Level;Reach Overhead;Stairs;Stand    Examination-Participation Restrictions Community Activity;Driving;Yard Work;Meal Prep    Stability/Clinical Decision Making Evolving/Moderate complexity    Rehab Potential Excellent    PT Frequency Other (comment)   then 2x/wk for 4 weeks   PT Duration Other (comment)   then 2x/wk for 4 weeks   PT Treatment/Interventions ADLs/Self Care Home Management;Aquatic Therapy;Gait training;Stair training;Functional mobility training;Therapeutic activities;Balance training;Therapeutic exercise;Neuromuscular re-education;Cognitive remediation;Patient/family education;Passive range of motion;Vestibular    PT Next Visit Plan Pt not tried yoga yet - tried kick boxing and it was challenging due to the coordination - continue to work on large step ups for hiking, balance with eyes closed, bending down to ground, endurance training    Consulted and Agree with Plan of Care Patient           Patient will benefit from skilled therapeutic  intervention in order to improve the following deficits and impairments:  Abnormal gait, Decreased activity tolerance, Decreased balance, Decreased cognition, Decreased coordination, Decreased endurance, Decreased mobility, Decreased strength, Dizziness, Impaired sensation, Impaired UE functional use, Postural dysfunction  Visit Diagnosis: Muscle weakness (generalized)  Unsteadiness on feet  Other abnormalities of gait and mobility  Other lack of coordination     Problem List Patient Active Problem List   Diagnosis Date Noted  . GBM (glioblastoma multiforme) ([facility])   . Steroid-induced hyperglycemia   . Seizures ([place])   . Leucocytosis   . Bradycardia   . Status post craniotomy 02/16/2020  . Tick bite 02/08/2020  . History of recent hospitalization 12/21/2019  . Seizure disorder ([facility]) 12/21/2019  . Hypokalemia 12/21/2019  . Long-term use of high-risk medication 12/21/2019  . Frontal mass of brain 12/16/2019  . Family history of colon cancer - father 32 05/26/2015  . Colon cancer screening 03/10/2015  . Vitamin D deficiency 03/10/2015    Narda Bonds, PTA [facility] 04/18/20 12:18 PM Phone: [phone] Fax: [facility] [address] [place], [place], [address] Phone: [phone]   Fax:  [phone]  Name: Hannah [name] MRN: [id number] Date of Birth: 02/26/1961

## 2020-04-18 NOTE — Telephone Encounter (Signed)
No los per 6/14. 

## 2020-04-19 ENCOUNTER — Encounter: Payer: Self-pay | Admitting: Physical Therapy

## 2020-04-19 ENCOUNTER — Ambulatory Visit: Payer: No Typology Code available for payment source | Admitting: Physical Therapy

## 2020-04-19 ENCOUNTER — Other Ambulatory Visit: Payer: Self-pay

## 2020-04-19 DIAGNOSIS — R278 Other lack of coordination: Secondary | ICD-10-CM

## 2020-04-19 DIAGNOSIS — M6281 Muscle weakness (generalized): Secondary | ICD-10-CM

## 2020-04-19 DIAGNOSIS — R2689 Other abnormalities of gait and mobility: Secondary | ICD-10-CM

## 2020-04-19 DIAGNOSIS — C711 Malignant neoplasm of frontal lobe: Secondary | ICD-10-CM | POA: Diagnosis not present

## 2020-04-19 DIAGNOSIS — R2681 Unsteadiness on feet: Secondary | ICD-10-CM

## 2020-04-19 DIAGNOSIS — I69851 Hemiplegia and hemiparesis following other cerebrovascular disease affecting right dominant side: Secondary | ICD-10-CM

## 2020-04-19 DIAGNOSIS — R42 Dizziness and giddiness: Secondary | ICD-10-CM

## 2020-04-19 NOTE — Therapy (Signed)
[facility] [address] [place], [place], [address] Phone: [phone]   Fax:  [phone]  Physical Therapy Treatment  Patient Details  Name: Hannah [name] MRN: [id number] Date of Birth: 05-15-1961 Referring Provider (PT): Duffy Rhody, MD   Encounter Date: 04/19/2020   PT End of Session - 04/19/20 2117    Visit Number 11    Number of Visits 21    Date for PT Re-Evaluation 05/19/20    Authorization Type [name]    PT Start Time 1445    PT Stop Time 1530    PT Time Calculation (min) 45 min    Activity Tolerance Patient tolerated treatment well   some increase in dizziness with certain tasks   Behavior During Therapy [facility] for tasks assessed/performed           Past Medical History:  Diagnosis Date  . Cervical spondylolysis 12/15/2019   Skeleton: Mild cervical spondylosis C6-7. Incidental find.   . Left ankle sprain 10/07/2011  . Seizure ([place])    partial   . Tick bite 02/2020    Past Surgical History:  Procedure Laterality Date  . APPLICATION OF CRANIAL NAVIGATION Left 02/16/2020   Procedure: APPLICATION OF CRANIAL NAVIGATION;  Surgeon: Vallarie Mare, MD;  Location: [place];  Service: Neurosurgery;  Laterality: Left;  . CRANIOTOMY Left 02/16/2020   Procedure: LEFT FRONTAL CRANIOTOMY FOR BRAIN TUMOR;  Surgeon: Vallarie Mare, MD;  Location: [place];  Service: Neurosurgery;  Laterality: Left;  . FOOT SURGERY Left    bone spur - local anesthesia  . TONSILECTOMY/ADENOIDECTOMY WITH MYRINGOTOMY     tonsils and adenoids out only  . WISDOM TOOTH EXTRACTION      There were no vitals filed for this visit.   Subjective Assessment - 04/19/20 1457    Subjective Alerted PT to results of oncology visit.  Really felt challenged by the aquatic therapy    Pertinent History No signitifcant history prior to GBM s/p  frontal craniotomy 4/14    Patient Stated Goals "I want to get back to running, playing tennis, hiking, biking.  I want to return to work"    Currently in Pain? 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 [place] play tennis, bike, run, hike, camping, playing board/card games (1x/wk), kayak      Observation/Other Assessments   Focus on Therapeutic Outcomes (FOTO)  Not assessed      Ambulation/Gait   Ambulation/Gait Yes    Ambulation/Gait Assistance 6: Modified independent (Device/Increase time)    Assistive device None    Gait Pattern Step-through pattern    Ambulation Surface Level;Indoor    Stairs Yes    Stairs  Assistance 6: Modified independent (Device/Increase time)    Stairs Assistance Details (indicate cue type and reason) performed one flight without use of rails and without objects in UE.  Then performed one set of stairs while holding a box in bilat UE.  Pt reports she has been carrying objects up and down her stairs at home including the laundry basket and vaccum cleaner without difficulty.      Stair Management Technique No rails;Alternating pattern;Forwards    Number of Stairs 12    Height of Stairs 6      Standardized Balance Assessment   Standardized Balance Assessment Five Times Sit to Stand    Five times sit to stand comments  11 seconds without use of UE      Functional Gait  Assessment   Gait assessed  Yes    Gait Level Surface Walks 20 ft  in less than 5.5 sec, no assistive devices, good speed, no evidence for imbalance, normal gait pattern, deviates no more than 6 in outside of the 12 in walkway width.    Change in Gait Speed Able to smoothly change walking speed without loss of balance or gait deviation. Deviate no more than 6 in outside of the 12 in walkway width.    Gait with Horizontal Head Turns Performs head turns smoothly with slight change in gait velocity (eg, minor disruption to smooth gait path), deviates 6-10 in outside 12 in walkway width, or uses an assistive device.    Gait with Vertical Head Turns Performs head turns with no change in gait. Deviates no more than 6 in outside 12 in walkway width.    Gait and Pivot Turn Pivot turns safely within 3 sec and stops quickly with no loss of balance.    Step Over Obstacle Is able to step over 2 stacked shoe boxes taped together (9 in total height) without changing gait speed. No evidence of imbalance.    Gait with Narrow Base of Support Is able to ambulate for 10 steps heel to toe with no staggering.    Gait with Eyes Closed Walks 20 ft, slow speed, abnormal gait pattern, evidence for imbalance, deviates 10-15 in outside 12 in walkway width. Requires more than 9 sec to ambulate 20 ft.    Ambulating Backwards Walks 20 ft, uses assistive device, slower speed, mild gait deviations, deviates 6-10 in outside 12 in walkway width.    Steps Alternating feet, no rail.    Total Score 26    FGA comment: 26/30               Vestibular Assessment - 04/19/20 1520      Positional Sensitivities   Sit to Supine No dizziness    Supine to Left Side Mild dizziness   has had for 10 years   Supine to Right Side Mild dizziness   has had for 10 years   Supine to Sitting No dizziness    Nose to Right Knee Lightheadedness    Right Knee to Sitting Lightedness    Nose to Left Knee Lightheadedness    Left Knee to Sitting Lightheadedness    Head Turning x 5 Lightheadedness    Head Nodding x  5 No dizziness    Pivot Right in Standing Lightheadedness    Pivot Left in Standing Lightheadedness    Rolling Right Mild dizziness    Rolling Left Mild dizziness    Positional Sensitivities Comments reports it is getting easier to look from grocery store shelves back to  cart                            PT Education - 04/19/20 2117    Education Details progress towards goals, areas to continue to focus on, will set up 3 more aquatic and one more land appointment prior to D/C    Person(s) Educated Patient    Methods Explanation    Comprehension Verbalized understanding            PT Short Term Goals - 04/07/20 1546      PT SHORT TERM GOAL #1   Title Pt will be indepenent with initial HEP in order to indicate improved functional mobility and decreased fall risk.  (Target Date: 03/26/20)    Time 4    Period Weeks    Status Achieved    Target Date 03/26/20      PT SHORT TERM GOAL #2   Title Pt will improve 5TSS to </=13 secs without UE support in order to indicate improved functional strength.    Baseline 14    Time 4    Period Weeks    Status Partially Met      PT SHORT TERM GOAL #3   Title Pt will improve FGA to >/=21/30 in order to indicate decreased fall risk.    Baseline 25/30    Time 4    Period Weeks    Status Achieved      PT SHORT TERM GOAL #4   Title Pt will improve gait speed to >/=4.00 ft/sec in order to indicate more normal gait speed.    Baseline 4.3 ft/sec    Time 4    Period Weeks    Status Achieved      PT SHORT TERM GOAL #5   Title Will participate in formal vestibular assessment and LTGs to be written as appropriate.    Time 4    Period Weeks    Status Achieved      PT SHORT TERM GOAL #6   Title Pt will negotiate up/down 12 steps with single rail at mod I level (reciprocal pattern) in order to negotiate in home safely.    Baseline performed 12 alternating sequence no rails    Time 4    Period Weeks    Status Achieved              PT Long Term Goals - 04/19/20 1459      PT LONG TERM GOAL #1   Title Pt will be independent with final HEP in order to indicate decreased fall risk and improved functional mobility (Target Date: 04/25/20)    Time 8    Period Weeks    Status Achieved      PT LONG TERM GOAL #2   Title Pt will perform 5TSS </=11 secs in order to indicate improved functional strength.    Baseline 11 seconds no UE support    Time 8    Period Weeks    Status Achieved      PT LONG TERM GOAL #3   Title Pt will improve FGA to >/=25/30 in order to indicate decreased fall risk.    Baseline 26/30    Time 8    Period Weeks    Status Achieved      PT LONG TERM GOAL #4   Title Pt will ambulate >1000' over varying outdoor surfaces at mod I level while scanning environment without LOB in order to indicate safe  community mobility.    Time 8    Period Weeks    Status On-going      PT LONG TERM GOAL #5   Title Pt will demonstrate ability to begin jogging, hiking, [name]    Baseline has been hiking, is getting on her bike this afternoon.    Time 8    Period Weeks    Status Partially Met      PT LONG TERM GOAL #6   Title Pt will negotiate up/down 12 steps without rail in reciprocal pattern while carrying items with BUEs to simulate return to ADLs.    Baseline reports she is doing at home    Time 8    Period Weeks    Status Achieved      PT LONG TERM GOAL #7   Title Patient will report no dizziness with bed mobility, quick head and body turns, and bending down to the ground    Baseline has decreased to 1/5 overall    Time 8    Period Weeks    Status Partially Met           New goals for recertification:  PT Short Term Goals - 04/19/20 2133      PT SHORT TERM GOAL #1   Title = LTG           PT Long Term Goals - 04/19/20 2133      PT LONG TERM GOAL #1   Title Pt will be independent with final land and aquatic  HEP in order to indicate decreased fall risk and improved functional mobility.    Time 6    Period Weeks    Status Revised    Target Date 06/03/20      PT LONG TERM GOAL #2   Title --    Baseline --    Time --    Period --    Status --      PT LONG TERM GOAL #3   Title Pt will improve FGA to >/=28/30 in order to indicate decreased fall risk.    Baseline 26/30    Time 6    Period Weeks    Status Revised    Target Date 06/03/20      PT LONG TERM GOAL #4   Title Pt will ambulate >1000' over varying outdoor surfaces independently while scanning environment and making L and R turns without LOB in order to indicate safe community mobility.    Time 6    Period Weeks    Status Revised    Target Date 06/03/20      PT LONG TERM GOAL #5   Title Pt will demonstrate ability to begin jogging, hiking, [name]    Baseline has been hiking, is getting on her bike this afternoon.  No jogging    Time 6    Period Weeks    Status Revised    Target Date 06/03/20      PT LONG TERM GOAL #6   Title --    Baseline --    Time --    Period --    Status --      PT LONG TERM GOAL #7   Title Patient will report no dizziness with bed mobility, quick head and body turns, and bending down to the ground    Baseline has decreased  to 1/5 overall    Time 6    Period Weeks    Status Revised    Target Date 06/03/20                  Plan - 04/19/20 2119    Clinical Impression Statement Treatment session focused on assessment of progress towards LTG.  Pt is making steady progress and has met 4/7 LTG, partially meeting 2 goals and one goal is ongoing.  Pt is demonstrating improvement in functional LE strength, improved balance with higher level gait challenges including gait with decreased visual input, improved balance and safety when negotiating stairs, and decreased dizziness and motion sensitivity.  Pt continues to  present with balance impairments when ambulating with head turns, with decreased visual input, motion sensitivity with repetitive head turns, body turns and bending down to the floor placing patient at increased falls risk especially as she is getting back to hiking, biking and jogging.  Pt will benefit from further skiled PT services to address these impairments to maximize functional mobility independence and decrease falls risk.    Personal Factors and Comorbidities Comorbidity 1;Social Background;Profession;Fitness;Transportation    Comorbidities craniotomy    Examination-Activity Limitations Caring for Others;Carry;Lift;Locomotion Level;Reach Overhead;Stairs;Stand    Examination-Participation Restrictions Community Activity;Driving;Yard Work;Meal Prep    Stability/Clinical Decision Making --    Rehab Potential Excellent    PT Frequency 1x / week    PT Duration 4 weeks    PT Treatment/Interventions ADLs/Self Care Home Management;Aquatic Therapy;Gait training;Stair training;Functional mobility training;Therapeutic activities;Balance training;Therapeutic exercise;Neuromuscular re-education;Cognitive remediation;Patient/family education;Passive range of motion;Vestibular    PT Next Visit Plan Continue with aquatic therapy x 3; return to land for goal assessment and finalize HEP. 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 GBM (glioblastoma multiforme) ([place])   . Steroid-induced hyperglycemia   . Seizures ([place])   . Leucocytosis   . Bradycardia   . Status post craniotomy 02/16/2020  . Tick bite 02/08/2020  . History of recent hospitalization 12/21/2019  . Seizure disorder ([place]) 12/21/2019  . Hypokalemia 12/21/2019  . Long-term use of high-risk medication 12/21/2019  . Frontal mass of brain 12/16/2019  . Family history of colon cancer - father 23 05/26/2015  . Colon cancer screening 03/10/2015  . Vitamin D deficiency 03/10/2015    Rico Junker, PT, DPT 04/19/20    9:32 PM    [facility] [address] [place], [place], [address] Phone: [phone]   Fax:  [phone]  Name: BRIANNIE GUTIERREZ MRN: [id number] Date of Birth: Feb 09, 1961

## 2020-04-20 ENCOUNTER — Encounter: Payer: Self-pay | Admitting: Internal Medicine

## 2020-04-21 ENCOUNTER — Encounter: Payer: Self-pay | Admitting: Occupational Therapy

## 2020-04-21 ENCOUNTER — Ambulatory Visit: Payer: No Typology Code available for payment source | Admitting: Occupational Therapy

## 2020-04-21 ENCOUNTER — Other Ambulatory Visit: Payer: Self-pay

## 2020-04-21 DIAGNOSIS — R2681 Unsteadiness on feet: Secondary | ICD-10-CM

## 2020-04-21 DIAGNOSIS — R278 Other lack of coordination: Secondary | ICD-10-CM

## 2020-04-21 DIAGNOSIS — M6281 Muscle weakness (generalized): Secondary | ICD-10-CM

## 2020-04-21 DIAGNOSIS — C711 Malignant neoplasm of frontal lobe: Secondary | ICD-10-CM | POA: Diagnosis not present

## 2020-04-21 DIAGNOSIS — I69851 Hemiplegia and hemiparesis following other cerebrovascular disease affecting right dominant side: Secondary | ICD-10-CM

## 2020-04-21 NOTE — Therapy (Addendum)
[facility] [address] [place], [place], [address] Phone: [phone]   Fax:  [phone]  Occupational Therapy Treatment  Patient Details  Name: Hannah [name] MRN: [id number] Date of Birth: 06/25/1961 Referring Provider (OT): Dr. Dorcas Carrow. Marcello Moores   Encounter Date: 04/21/2020   OT End of Session - 04/21/20 1233    Visit Number 5    Number of Visits 17    Date for OT Re-Evaluation 05/02/20    Authorization Type [facility] (?primary):  $40 copay each discipline, auth required after 12th visit.  [place] 2021: $25 copay (1 per day), 20 visit limit OT (each), no auth required    Authorization - Visit Number 5    Authorization - Number of Visits 20    Progress Note Due on Visit 12    OT Start Time 1234    OT Stop Time 1340    OT Time Calculation (min) 66 min    Activity Tolerance Patient tolerated treatment well    Behavior During Therapy WFL for tasks assessed/performed           Past Medical History:  Diagnosis Date  . Cervical spondylolysis 12/15/2019   Skeleton: Mild cervical spondylosis C6-7. Incidental find.   . Left ankle sprain 10/07/2011  . Seizure ([place])    partial   . Tick bite 02/2020    Past Surgical History:  Procedure Laterality Date  . APPLICATION OF CRANIAL NAVIGATION Left 02/16/2020   Procedure: APPLICATION OF CRANIAL NAVIGATION;  Surgeon: Vallarie Mare, MD;  Location: [facility];  Service: Neurosurgery;  Laterality: Left;  . CRANIOTOMY Left 02/16/2020   Procedure: LEFT FRONTAL CRANIOTOMY FOR BRAIN TUMOR;  Surgeon: Vallarie Mare, MD;  Location: [facility];  Service: Neurosurgery;  Laterality: Left;  . FOOT SURGERY Left    bone spur - local anesthesia  . TONSILECTOMY/ADENOIDECTOMY WITH MYRINGOTOMY     tonsils and adenoids out only  . WISDOM TOOTH EXTRACTION      There were no vitals filed for this visit.   Subjective Assessment - 04/21/20 1254    Subjective  Pt is going to see 2nd  opinion at [facility] for new area on brain that was found on MRI.  Pt is unsure whether she wants to pursue chemo/radiation.  Pt anticipates return to work June 28th.    Pertinent History glioblastoma s/p craniotomy for resection 02/16/20, hx of seizure    Patient Stated Goals to be able to type with R hand to go back to work, return to playing tennis, be able to use RUE to push up from a seated position (beach chair), kayak    Currently in Pain? No/denies            Discussed recent MRI results--pt has requested 2nd opinion prior to making a decision to undergo radiation/chemo (as pt has concerns about radiation/chemo).   Recommended pt discuss with MD possible functional difficulties to watch for with new area found on MRI (due to pt questions).  Pt reports anticipated return to work June 28th at 5hrs/day.  Discussed anticipated difficulties per pt: divided attention to type, stay focused in busy environment, handling phone calls and entering and scheduling new pts due to amount of typing required, as well as fatigue.  Recommended possible compensation strategies including:  Using sticky note or blank word document to enter info and cut/paste into Epic, requesting pt repeat themselves and reading back info, using L hand to "count" instead of RUE, possibly having  co-worker handle phone calls/new referrals while pt does referrals and check-in/check-out (or other division or tasks instead of having to focus on all tasks), possibly work (as able) in separate office to minimize distractions, avoid keeping more than 1 chart open.    Also discussed that although pt may need to compensate with L hand, she should try to challenge R hand at home when able with R hand and bilateral coordination tasks.  (pt was able to count using fingers of L hand, but demo difficulty with R)  Clapping, table tapping, and raising BUEs in different sequences for bilateral hand coordination with good accuracy.          OT  Short Term Goals - 04/18/20 1244      OT SHORT TERM GOAL #1   Title Pt will be independent with initial HEP.--check STGs 04/21/20--extended due to decr frequency    Time 4    Period Weeks    Status Achieved      OT SHORT TERM GOAL #2   Title Pt will be able to eat with RUE 100% of the time including cutting meat.    Baseline 4    Period Weeks    Status Achieved      OT SHORT TERM GOAL #3   Title Pt will improve R hand coordination for ADLs as shown by improving time on 9-hole peg test by at least 8sec.    Baseline 33.8sec    Time 4    Period Weeks    Status On-going   26 sec     OT SHORT TERM GOAL #4   Title Pt will demo at least 38lbs R grip strength to assist in opening containers.    Baseline 30.4lbs    Time 4    Period Weeks    Status Achieved   42 lbs     OT SHORT TERM GOAL #5   Title Pt will be able to type at least 35wpm in prep for return to work.    Baseline 22wpm    Time 4    Period Weeks    Status On-going   04/05/20;  28wpm, 04/18/20: 26 wpm 98% accuracy     OT SHORT TERM GOAL #6   Title Pt will be able to shuffle cards for leisure activities.    Time 4    Period Weeks    Status Achieved             OT Long Term Goals - 04/21/20 1236      OT LONG TERM GOAL #1   Title Pt will be independent with updated HEP.--check LTGs 05/03/20    Time 8    Period Weeks    Status New      OT LONG TERM GOAL #2   Title Pt will demo at least 50lbs R grip strength for leisure tasks.    Baseline 30.4lbs    Time 8    Period Weeks    Status New      OT LONG TERM GOAL #3   Title Pt will be able to type at least 45 wpm in prep for return to work.    Time 8    Period Weeks    Status New      OT LONG TERM GOAL #4   Title Pt will report ability to use RUE as dominat UE for IADL tasks at least 95% of the time.    Baseline 8    Period Weeks  Status New      OT LONG TERM GOAL #5   Title Pt will be able to use RUE for simulated leisure tasks for at least 25 min  without significant rest break.    Baseline 8    Period Weeks    Status New      OT LONG TERM GOAL #6   Title Pt will be able to style hair (braid, pony tail) mod I.    Time 8    Period Weeks    Status Achieved   04/21/20:  met with difficulty     OT LONG TERM GOAL #7   Title Pt will be able to write at least 3 sentences within 49mn with no reports of pain in prep to return to work tasks.    Time 8    Period Weeks    Status New                 Plan - 04/21/20 1233    Clinical Impression Statement Pt reports new spot on brain with latest MRI and is pursuing second opinion prior to deciding further treatment.  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  Pt continues to report difficulty multi-tasking and with speech fluency at times as well as decr R hand coordination/speed.    Occupational performance deficits (Please refer to evaluation for details): ADL's;IADL's    Body Structure / Function / Physical Skills ADL;Dexterity;IADL;Balance;Strength;FMC;Coordination;UE functional use;Decreased knowledge of use of DME;Pain    Rehab Potential Good    OT Frequency 2x / week    OT Duration 8 weeks    OT Treatment/Interventions Self-care/ADL training;Moist Heat;Aquatic Therapy;DME and/or AE instruction;Therapeutic activities;Therapeutic exercise;Fluidtherapy;Visual/perceptual remediation/compensation;Passive range of motion;Neuromuscular education;Cryotherapy;Energy conservation;Manual Therapy;Patient/family education    Plan divided attention with coordination/typing, bilateral hand coordination, typing games (?clouds - animals), update HEP prn    Consulted and Agree with Plan of Care Patient           Patient will benefit from skilled therapeutic intervention in order to improve the following deficits and impairments:   Body Structure / Function / Physical Skills: ADL, Dexterity, IADL, Balance, Strength, FMC, Coordination, UE functional  use, Decreased knowledge of use of DME, Pain       Visit Diagnosis: Hemiplegia and hemiparesis following other cerebrovascular disease affecting right dominant side (HCC)  Other lack of coordination  Muscle weakness (generalized)  Unsteadiness on feet    Problem List Patient Active Problem List   Diagnosis Date Noted  . GBM (gli[place]iforme) ([place]   . Steroid-induced hyperglycemia   . Seizures ([place]   . Leucocytosis   . Bradycardia   . Status post craniotomy 02/16/2020  . Tick bite 02/08/2020  . History of recent hospitalization 12/21/2019  . Seizure disorder ([place] 12/21/2019  . Hypokalemia 12/21/2019  . Long-term use of high-risk medication 12/21/2019  . Frontal mass of brain 12/16/2019  . Family history of colon cancer - father 57507/22/2016  . Colon cancer screening 03/10/2015  . Vitamin D deficiency 03/10/2015    [facility]6/18/2021, 2:52 PM  [address] [address]Phone: [phone]  Fax:  [phone] Name: Hannah GRANATOMRN: [id number] of Birth: [birth year]  AVianne Bulls OTR/L [facility] [place]  [phone] [phone]/18/21 2:52 PM

## 2020-04-24 ENCOUNTER — Ambulatory Visit: Payer: No Typology Code available for payment source | Admitting: Physical Therapy

## 2020-04-24 ENCOUNTER — Encounter: Payer: Self-pay | Admitting: Family Medicine

## 2020-04-24 ENCOUNTER — Other Ambulatory Visit: Payer: Self-pay

## 2020-04-24 DIAGNOSIS — R278 Other lack of coordination: Secondary | ICD-10-CM

## 2020-04-24 DIAGNOSIS — R2689 Other abnormalities of gait and mobility: Secondary | ICD-10-CM

## 2020-04-24 DIAGNOSIS — C711 Malignant neoplasm of frontal lobe: Secondary | ICD-10-CM | POA: Diagnosis not present

## 2020-04-24 DIAGNOSIS — R2681 Unsteadiness on feet: Secondary | ICD-10-CM

## 2020-04-24 DIAGNOSIS — R42 Dizziness and giddiness: Secondary | ICD-10-CM

## 2020-04-24 DIAGNOSIS — M6281 Muscle weakness (generalized): Secondary | ICD-10-CM

## 2020-04-24 NOTE — Therapy (Signed)
[facility] [address] [place], [place], [address] Phone: [phone]   Fax:  [phone]  Physical Therapy Treatment  Patient Details  Name: Hannah [name] MRN: [id number] Date of Birth: 03-25-1961 Referring Provider (PT): Duffy Rhody, MD   Encounter Date: 04/24/2020   PT End of Session - 04/24/20 1433    Visit Number 12    Number of Visits 21    Date for PT Re-Evaluation 04/25/20    Authorization Type [name]    PT Start Time 1330    PT Stop Time 1415    PT Time Calculation (min) 45 min    Equipment Utilized During Treatment Other (comment)   buoyancy hand bar bells, ankle buoyancy cuffs   Activity Tolerance Patient tolerated treatment well   some increase in dizziness with certain tasks   Behavior During Therapy [facility] for tasks assessed/performed           Past Medical History:  Diagnosis Date  . Cervical spondylolysis 12/15/2019   Skeleton: Mild cervical spondylosis C6-7. Incidental find.   . Left ankle sprain 10/07/2011  . Seizure ([place])    partial   . Tick bite 02/2020    Past Surgical History:  Procedure Laterality Date  . APPLICATION OF CRANIAL NAVIGATION Left 02/16/2020   Procedure: APPLICATION OF CRANIAL NAVIGATION;  Surgeon: Hannah Mare, MD;  Location: [facility];  Service: Neurosurgery;  Laterality: Left;  . CRANIOTOMY Left 02/16/2020   Procedure: LEFT FRONTAL CRANIOTOMY FOR BRAIN TUMOR;  Surgeon: Hannah Mare, MD;  Location: [facility];  Service: Neurosurgery;  Laterality: Left;  . FOOT SURGERY Left    bone spur - local anesthesia  . TONSILECTOMY/ADENOIDECTOMY WITH MYRINGOTOMY     tonsils and adenoids out only  . WISDOM TOOTH EXTRACTION      There were no vitals filed for this visit.   Subjective Assessment - 04/24/20 1428    Subjective Denies any changes.  Does have some dizziness when  laying down but has a h/o vertigo.  Has a f/u appointment with neurosurgeon this afternoon.    Pertinent History No signitifcant history prior to GBM s/p frontal craniotomy 4/14    Patient Stated Goals "I want to get back to running, playing tennis, hiking, biking.  I want to return to work"    Currently in Pain? No/denies           Aquatic therapy at [facility] - pool temp. 87.4 degrees  Patient seen for aquatic therapy today. Treatment took place in water 4 feet deep. Pt entered/exited steps  with use of hand rails with supervision.  Pt performed bil.hamstring and heel cord stretch - runner's stretch 30 sec hold x 1 rep; heel cord stretch with toes/forefoot on pool wall 30 sec hold x 1 rep.  Performed at beginning and end of session.  Pt performed water walking forwards, backwards and sideways 2 reps 92mx 2 reps each across pool with cues for reciprocal arm movement; Had pt place UE's under water for increased UE resistance for strengthening.  Sideways stepping with squats72mx 2 repswith UE bar bells for resistance.  Forward walking with arm bar bells slightly under water 1m x 2  Pt performed jogging68mx4reps across pool with cues for reciprocal arm movement. Brief rest breakbetween each rep.  Squats bil LE x 15 reps without UE support then single leg x 15 reps  with UE support of pool edge.  Braiding 51m x 2 reps.  Tandem gait 25m x 2 reps  Decreased BOS with eyes closed x 30 sec x 2 sets. Same position with eyes open and head turns/nodsx 5 reps.   In supine position with pool noodle under arms/upper trunk for bicylclng LE's x 50m x 2 reps.  Pt needed rest break in between as she reports increased dizziness in this position.     With bil ankle buoyancy cuffs for bil hip abd/add, marching, hip extension, hamstring curl, hip flexion moving directly into extension, leg circles both directions and hip add across midline to wall.  Performed 15 reps each alternating  LE's with rare UE support needed of wall.      PT Short Term Goals - 04/19/20 2133      PT SHORT TERM GOAL #1   Title = LTG             PT Long Term Goals - 04/19/20 2133      PT LONG TERM GOAL #1   Title Pt will be independent with final land and aquatic HEP in order to indicate decreased fall risk and improved functional mobility.    Time 6    Period Weeks    Status Revised    Target Date 06/03/20      PT LONG TERM GOAL #2   Title --    Baseline --    Time --    Period --    Status --      PT LONG TERM GOAL #3   Title Pt will improve FGA to >/=28/30 in order to indicate decreased fall risk.    Baseline 26/30    Time 6    Period Weeks    Status Revised    Target Date 06/03/20      PT LONG TERM GOAL #4   Title Pt will ambulate >1000' over varying outdoor surfaces independently while scanning environment and making L and R turns without LOB in order to indicate safe community mobility.    Time 6    Period Weeks    Status Revised    Target Date 06/03/20      PT LONG TERM GOAL #5   Title Pt will demonstrate ability to begin jogging, hiking, [name]    Baseline has been hiking, is getting on her bike this afternoon.  No jogging    Time 6    Period Weeks    Status Revised    Target Date 06/03/20      PT LONG TERM GOAL #6   Title --    Baseline --    Time --    Period --    Status --      PT LONG TERM GOAL #7   Title Patient will report no dizziness with bed mobility, quick head and body turns, and bending down to the ground    Baseline has decreased to 1/5 overall    Time 6    Period Weeks    Status Revised    Target Date 06/03/20                 Plan - 04/24/20 1434    Clinical Impression Statement Pt with c/o increased dizziness when in supine position in water.  States that she has h/o vertigo and dizziness is only when she lays down.  Tolerated session well otherwise  with minimal rest breaks.  Cont per poc.    Personal Factors and Comorbidities Comorbidity 1;Social Background;Profession;Fitness;Transportation    Comorbidities craniotomy    Examination-Activity Limitations Caring for Others;Carry;Lift;Locomotion Level;Reach Overhead;Stairs;Stand    Examination-Participation Restrictions Community Activity;Driving;Yard Work;Meal Prep    Rehab Potential Excellent    PT Frequency 1x / week    PT Duration 6 weeks    PT Treatment/Interventions ADLs/Self Care Home Management;Aquatic Therapy;Gait training;Stair training;Functional mobility training;Therapeutic activities;Balance training;Therapeutic exercise;Neuromuscular re-education;Cognitive remediation;Patient/family education;Passive range of motion;Vestibular    PT Next Visit Plan Continue with aquatic therapy x 2; return to land for goal assessment and finalize HEP. continue to work on large step ups for hiking, balance with eyes closed, bending down to ground, endurance training    Consulted and Agree with Plan of Care Patient           Patient will benefit from skilled therapeutic intervention in order to improve the following deficits and impairments:  Abnormal gait, Decreased activity tolerance, Decreased balance, Decreased cognition, Decreased coordination, Decreased endurance, Decreased mobility, Decreased strength, Dizziness, Impaired sensation, Impaired UE functional use, Postural dysfunction  Visit Diagnosis: Unsteadiness on feet  Muscle weakness (generalized)  Other lack of coordination  Other abnormalities of gait and mobility  Dizziness and giddiness     Problem List Patient Active Problem List   Diagnosis Date Noted  . GBM (glioblastoma multiforme) ([place])   . Steroid-induced hyperglycemia   . Seizures ([place])   . Leucocytosis   . Bradycardia   . Status post craniotomy 02/16/2020  . Tick bite 02/08/2020  . History of recent hospitalization 12/21/2019  . Seizure disorder ([place])  12/21/2019  . Hypokalemia 12/21/2019  . Long-term use of high-risk medication 12/21/2019  . Frontal mass of brain 12/16/2019  . Family history of colon cancer - father 28 05/26/2015  . Colon cancer screening 03/10/2015  . Vitamin D deficiency 03/10/2015    Narda Bonds, PTA [facility] 04/24/20 2:47 PM Phone: [phone] Fax: [facility] [address] [place], [place], [address] Phone: [phone]   Fax:  [phone]  Name: Hannah [name] MRN: [id number] Date of Birth: 05-28-1961

## 2020-04-25 ENCOUNTER — Ambulatory Visit: Payer: No Typology Code available for payment source | Admitting: Speech Pathology

## 2020-04-25 DIAGNOSIS — R4701 Aphasia: Secondary | ICD-10-CM

## 2020-04-25 DIAGNOSIS — R41841 Cognitive communication deficit: Secondary | ICD-10-CM

## 2020-04-25 DIAGNOSIS — C711 Malignant neoplasm of frontal lobe: Secondary | ICD-10-CM | POA: Diagnosis not present

## 2020-04-25 NOTE — Patient Instructions (Signed)
Build in rest breaks at work. If you start to notice the signs of feeling foggy or fatigued, take a break in a quiet, low-stimulus area. Ideally you want to pre-empt feeling overwhelmed.   It may be handy to have written scripts of common phone calls you make, for you to fall back on.  Alert the other person you're speaking with that sometimes you have trouble thinking of words, and let them know how to respond when this happens ("Give me a little extra time" "Remind me of what we were just talking about," "Help me with the word if I ask you,").   Reduce/minimize distractions as much as possible. Schedule more difficult or complex tasks for the part of the day when you're at your best.   Keep a notebook or sticky note with you at work to write down notes about conversations, and to keep a to-do list for yourself. If you're working on something and get interrupted, make note of what you were doing so you remember to go back to it. You can also use Sticky Notes app or Word.  [email or web address].[email or web address] : 5 minute video that explains about aphasia that you can share with friends or coworkers  If you want to come back, ask any of your MDs for another referral and we are happy to see you anytime.

## 2020-04-26 ENCOUNTER — Telehealth: Payer: Self-pay | Admitting: Internal Medicine

## 2020-04-26 ENCOUNTER — Encounter: Payer: Self-pay | Admitting: Family Medicine

## 2020-04-26 ENCOUNTER — Ambulatory Visit: Payer: No Typology Code available for payment source | Admitting: Occupational Therapy

## 2020-04-26 NOTE — Telephone Encounter (Signed)
Release: [id number] faxed medical records to [facility] @ [phone]

## 2020-04-26 NOTE — Therapy (Signed)
[facility] [address] [place], [place], [address] Phone: [phone]   Fax:  [phone]  Speech Language Pathology Treatment and Discharge Summary  Patient Details  Name: Hannah [name] MRN: [id number] Date of Birth: 09/11/1961 Referring Provider (SLP): Duffy Rhody, MD   Encounter Date: 04/25/2020   End of Session - 04/26/20 1149    Visit Number 5    Number of Visits 5    Date for SLP Re-Evaluation 04/25/20    Authorization Type [facility] Start Time 1620    SLP Stop Time  1702    SLP Time Calculation (min) 42 min    Activity Tolerance Patient tolerated treatment well           Past Medical History:  Diagnosis Date  . Cervical spondylolysis 12/15/2019   Skeleton: Mild cervical spondylosis C6-7. Incidental find.   . Left ankle sprain 10/07/2011  . Seizure ([place])    partial   . Tick bite 02/2020    Past Surgical History:  Procedure Laterality Date  . APPLICATION OF CRANIAL NAVIGATION Left 02/16/2020   Procedure: APPLICATION OF CRANIAL NAVIGATION;  Surgeon: Vallarie Mare, MD;  Location: [facility];  Service: Neurosurgery;  Laterality: Left;  . CRANIOTOMY Left 02/16/2020   Procedure: LEFT FRONTAL CRANIOTOMY FOR BRAIN TUMOR;  Surgeon: Vallarie Mare, MD;  Location: [facility];  Service: Neurosurgery;  Laterality: Left;  . FOOT SURGERY Left    bone spur - local anesthesia  . TONSILECTOMY/ADENOIDECTOMY WITH MYRINGOTOMY     tonsils and adenoids out only  . WISDOM TOOTH EXTRACTION      There were no vitals filed for this visit.   Subjective Assessment - 04/25/20 1623    Subjective "It's good days and bad days."    Currently in Pain? No/denies                 ADULT SLP TREATMENT - 04/26/20 1143      General Information   Behavior/Cognition Alert;Cooperative;Pleasant mood      Treatment Provided   Treatment provided Cognitive-Linquistic      Pain Assessment   Pain Assessment  No/denies pain      Cognitive-Linquistic Treatment   Treatment focused on Cognition;Aphasia    Skilled Treatment Mod complex conversation today was functional with pt using slower rate independently. She had one brief instance of anomia (<3 second delay). She is planning to return to work on Monday on a part-time basis. SLP worked with patient to ID strategies and compensations she can use at work to maximize her function (see patient instructions). Patient stated, "I've gotten enough tips and tricks to manage," and reports she is ready to be discharged today. We discussed that as her treatment progresses should she note ongoing difficulties she is encouraged to return for additional sessions.      Assessment / Recommendations / Plan   Plan Discharge SLP treatment due to (comment)   goals met; pt satisfied with current functional level     Progression Toward Goals   Progression toward goals Goals met, education completed, patient discharged from [facility] - 04/26/20 [place] #1   Title Patient will demonstrate understanding of appropriate cognitive-linguistic activities for home practice.    Baseline 03-06-20    Status Achieved      SLP LONG  TERM GOAL #2   Title Patient will use compensations for anomia in 15 minutes mod complex conversation functionally.    Status Achieved      SLP LONG TERM GOAL #3   Title Pt will report use of aids/strategies to assist with multitasking, schedule and information management.    Time 1    Period Weeks    Status Achieved            Plan - 04/26/20 1150    Clinical Impression Statement [name] presents with overall mild higher level cognitive and language deficits s/p resection of left frontal glioblastoma. She plans to return to work on part-time basis on Monday; discussed compensations for attention, communication, and organization; see pt instructions for details. Pt had only 1  anomic episode lasting <3 seconds today in mod complex conversation. She cont to report improvements in language production since discharge from hospital. She is satisfied with current functional level and in agreement with d/c today; SLP and patient discussed that patient may benefit from additional therapy in the future should she note functional deficits as her course of treatment progresses.    Speech Therapy Frequency --   d/c   Duration --   d/c   Treatment/Interventions Language facilitation;Cueing hierarchy;SLP instruction and feedback;Compensatory techniques;Cognitive reorganization;Functional tasks;Compensatory strategies;Multimodal communcation approach;Internal/external aids;Patient/family education    Potential to Achieve Goals Good    Consulted and Agree with Plan of Care Patient;Family member/caregiver    Family Member Consulted husband           Patient will benefit from skilled therapeutic intervention in order to improve the following deficits and impairments:   Aphasia  Cognitive communication deficit    Problem List Patient Active Problem List   Diagnosis Date Noted  . GBM (glioblastoma multiforme) ([place])   . Steroid-induced hyperglycemia   . Seizures ([place])   . Leucocytosis   . Bradycardia   . Status post craniotomy 02/16/2020  . Tick bite 02/08/2020  . History of recent hospitalization 12/21/2019  . Seizure disorder ([place]) 12/21/2019  . Hypokalemia 12/21/2019  . Long-term use of high-risk medication 12/21/2019  . Frontal mass of brain 12/16/2019  . Family history of colon cancer - father 73 05/26/2015  . Colon cancer screening 03/10/2015  . Vitamin D deficiency 03/10/2015   SPEECH THERAPY DISCHARGE SUMMARY  Visits from Start of Care: 5  Current functional level related to goals / functional outcomes: Patient has met 3/3 LTGs. She is using compensations for language and cognition at home and plans to return to work next week on a part time basis with  accommodations.   Remaining deficits: Mild cognitive and expressive language deficits for which patient is compensating with slower rate, extra time, and use of external aids.    Education / Equipment: Compensations for attention, executive function, and language. Rest breaks to reduce cognitive fatigue. Plan: Patient agrees to discharge.  Patient goals were met. Patient is being discharged due to meeting the stated rehab goals.  ?????        Deneise Lever, [place], CCC-SLP Speech-Language Pathologist  Aliene Altes 04/26/2020, 11:53 AM  [facility] [address] [place], [place], [address] Phone: [phone]   Fax:  [phone]   Name: Hannah [name] MRN: [id number] Date of Birth: [birth year]

## 2020-04-28 ENCOUNTER — Ambulatory Visit: Payer: No Typology Code available for payment source | Admitting: Occupational Therapy

## 2020-04-28 ENCOUNTER — Other Ambulatory Visit: Payer: Self-pay

## 2020-04-28 ENCOUNTER — Encounter: Payer: Self-pay | Admitting: Occupational Therapy

## 2020-04-28 DIAGNOSIS — R2689 Other abnormalities of gait and mobility: Secondary | ICD-10-CM

## 2020-04-28 DIAGNOSIS — C711 Malignant neoplasm of frontal lobe: Secondary | ICD-10-CM | POA: Diagnosis not present

## 2020-04-28 DIAGNOSIS — R278 Other lack of coordination: Secondary | ICD-10-CM

## 2020-04-28 DIAGNOSIS — M6281 Muscle weakness (generalized): Secondary | ICD-10-CM

## 2020-04-28 DIAGNOSIS — I69851 Hemiplegia and hemiparesis following other cerebrovascular disease affecting right dominant side: Secondary | ICD-10-CM

## 2020-04-28 DIAGNOSIS — R2681 Unsteadiness on feet: Secondary | ICD-10-CM

## 2020-04-28 NOTE — Therapy (Signed)
[facility] [address] [place], [place], [address] Phone: [phone]   Fax:  [phone]  Occupational Therapy Treatment  Patient Details  Name: Hannah [name] MRN: [id number] Date of Birth: [birth year] Referring Provider (OT): Dr. Dorcas Carrow. Marcello Moores   Encounter Date: 04/28/2020   OT End of Session - 04/28/20 1254    Visit Number 6    Number of Visits 17    Date for OT Re-Evaluation 05/02/20    Authorization Type  [facility] (?primary):  $40 copay each discipline, auth required after 12th visit.  [facility] 2021: $25 copay (1 per day), 20 visit limit OT (each), no auth required    Authorization - Visit Number 6    Authorization - Number of Visits 20    Progress Note Due on Visit 12    OT Start Time 1234    OT Stop Time 1315    OT Time Calculation (min) 41 min    Activity Tolerance Patient tolerated treatment well    Behavior During Therapy WFL for tasks assessed/performed           Past Medical History:  Diagnosis Date  . Cervical spondylolysis 12/15/2019   Skeleton: Mild cervical spondylosis C6-7. Incidental find.   . Left ankle sprain 10/07/2011  . Seizure ([place])    partial   . Tick bite 02/2020    Past Surgical History:  Procedure Laterality Date  . APPLICATION OF CRANIAL NAVIGATION Left 02/16/2020   Procedure: APPLICATION OF CRANIAL NAVIGATION;  Surgeon: Vallarie Mare, MD;  Location: [facility];  Service: Neurosurgery;  Laterality: Left;  . CRANIOTOMY Left 02/16/2020   Procedure: LEFT FRONTAL CRANIOTOMY FOR BRAIN TUMOR;  Surgeon: Vallarie Mare, MD;  Location: [facility];  Service: Neurosurgery;  Laterality: Left;  . FOOT SURGERY Left    bone spur - local anesthesia  . TONSILECTOMY/ADENOIDECTOMY WITH MYRINGOTOMY     tonsils and adenoids out only  . WISDOM TOOTH EXTRACTION      There were no vitals filed for this visit.   Subjective Assessment - 04/28/20 1236    Currently in Pain? No/denies                     Treatment: Typing test              OT Short Term Goals - 04/18/20 1244      OT SHORT TERM GOAL #1   Title Pt will be independent with initial HEP.--check STGs 04/21/20--extended due to decr frequency    Time 4    Period Weeks    Status Achieved      OT SHORT TERM GOAL #2   Title Pt will be able to eat with RUE 100% of the time including cutting meat.    Baseline 4    Period Weeks    Status Achieved      OT SHORT TERM GOAL #3   Title Pt will improve R hand coordination for ADLs as shown by improving time on 9-hole peg test by at least 8sec.    Baseline 33.8sec    Time 4    Period Weeks    Status On-going   26 sec     OT SHORT TERM GOAL #4   Title Pt will demo at least 38lbs R grip strength to assist in opening containers.    Baseline 30.4lbs    Time 4    Period Weeks    Status Achieved   42 lbs  OT SHORT TERM GOAL #5   Title Pt will be able to type at least 35wpm in prep for return to work.    Baseline 22wpm    Time 4    Period Weeks    Status On-going   04/05/20;  28wpm, 04/18/20: 26 wpm 98% accuracy     OT SHORT TERM GOAL #6   Title Pt will be able to shuffle cards for leisure activities.    Time 4    Period Weeks    Status Achieved             OT Long Term Goals - 04/21/20 1236      OT LONG TERM GOAL #1   Title Pt will be independent with updated HEP.--check LTGs 05/03/20    Time 8    Period Weeks    Status New      OT LONG TERM GOAL #2   Title Pt will demo at least 50lbs R grip strength for leisure tasks.    Baseline 30.4lbs    Time 8    Period Weeks    Status New      OT LONG TERM GOAL #3   Title Pt will be able to type at least 45 wpm in prep for return to work.    Time 8    Period Weeks    Status New      OT LONG TERM GOAL #4   Title Pt will report ability to use RUE as dominat UE for IADL tasks at least 95% of the time.    Baseline 8    Period Weeks    Status New      OT LONG TERM GOAL #5   Title  Pt will be able to use RUE for simulated leisure tasks for at least 25 min without significant rest break.    Baseline 8    Period Weeks    Status New      OT LONG TERM GOAL #6   Title Pt will be able to style hair (braid, pony tail) mod I.    Time 8    Period Weeks    Status Achieved   04/21/20:  met with difficulty     OT LONG TERM GOAL #7   Title Pt will be able to write at least 3 sentences within 48mn with no reports of pain in prep to return to work tasks.    Time 8    Period Weeks    Status New                 Plan - 04/28/20 1237    Clinical Impression Statement Pt plans to return to work next week. Pt is progressing towards goals    Occupational performance deficits (Please refer to evaluation for details): ADL's;IADL's    Body Structure / Function / Physical Skills ADL;Dexterity;IADL;Balance;Strength;FMC;Coordination;UE functional use;Decreased knowledge of use of DME;Pain    Rehab Potential Good    OT Frequency 2x / week    OT Duration 8 weeks    OT Treatment/Interventions Self-care/ADL training;Moist Heat;Aquatic Therapy;DME and/or AE instruction;Therapeutic activities;Therapeutic exercise;Fluidtherapy;Visual/perceptual remediation/compensation;Passive range of motion;Neuromuscular education;Cryotherapy;Energy conservation;Manual Therapy;Patient/family education    Plan divided attention with coordination/typing, bilateral hand coordination, typing games, update HEP prn    Consulted and Agree with Plan of Care Patient           Patient will benefit from skilled therapeutic intervention in order to improve the following deficits and impairments:   Body Structure /  Function / Physical Skills: ADL, Dexterity, IADL, Balance, Strength, FMC, Coordination, UE functional use, Decreased knowledge of use of DME, Pain       Visit Diagnosis: Muscle weakness (generalized)  Other lack of coordination  Other abnormalities of gait and mobility  Hemiplegia and  hemiparesis following other cerebrovascular disease affecting right dominant side [facility])    Problem List Patient Active Problem List   Diagnosis Date Noted  . GBM (glioblastoma multiforme) ([place])   . Steroid-induced hyperglycemia   . Seizures ([place])   . Leucocytosis   . Bradycardia   . Status post craniotomy 02/16/2020  . Tick bite 02/08/2020  . History of recent hospitalization 12/21/2019  . Seizure disorder ([place]) 12/21/2019  . Hypokalemia 12/21/2019  . Long-term use of high-risk medication 12/21/2019  . Frontal mass of brain 12/16/2019  . Family history of colon cancer - father 20 05/26/2015  . Colon cancer screening 03/10/2015  . Vitamin D deficiency 03/10/2015    Price [place] 04/28/2020, 1:25 PM  [facility] [address] [place], [place], [address] Phone: [phone]   Fax:  [phone]  Name: Hannah [name] MRN: [id number] Date of Birth: 24-Jul-1961

## 2020-04-28 NOTE — Therapy (Signed)
[facility] [address] [place], [place], [address] Phone: [phone]   Fax:  [phone]  Occupational Therapy Treatment  Patient Details  Name: Hannah [name] MRN: [id number] Date of Birth: 1961-01-06 Referring Provider (OT): Dr. Dorcas Carrow. Marcello Moores   Encounter Date: 04/28/2020   OT End of Session - 04/28/20 1254    Visit Number 6    Number of Visits 17    Date for OT Re-Evaluation 05/02/20    Authorization Type [facility] (?primary):  $40 copay each discipline, auth required after 12th visit.  [place] 2021: $25 copay (1 per day), 20 visit limit OT (each), no auth required    Authorization - Visit Number 6    Authorization - Number of Visits 20    Progress Note Due on Visit 12    OT Start Time 1234    OT Stop Time 1315    OT Time Calculation (min) 41 min    Activity Tolerance Patient tolerated treatment well    Behavior During Therapy WFL for tasks assessed/performed           Past Medical History:  Diagnosis Date  . Cervical spondylolysis 12/15/2019   Skeleton: Mild cervical spondylosis C6-7. Incidental find.   . Left ankle sprain 10/07/2011  . Seizure ([place])    partial   . Tick bite 02/2020    Past Surgical History:  Procedure Laterality Date  . APPLICATION OF CRANIAL NAVIGATION Left 02/16/2020   Procedure: APPLICATION OF CRANIAL NAVIGATION;  Surgeon: Vallarie Mare, MD;  Location: [facility];  Service: Neurosurgery;  Laterality: Left;  . CRANIOTOMY Left 02/16/2020   Procedure: LEFT FRONTAL CRANIOTOMY FOR BRAIN TUMOR;  Surgeon: Vallarie Mare, MD;  Location: [facility];  Service: Neurosurgery;  Laterality: Left;  . FOOT SURGERY Left    bone spur - local anesthesia  . TONSILECTOMY/ADENOIDECTOMY WITH MYRINGOTOMY     tonsils and adenoids out only  . WISDOM TOOTH EXTRACTION      There were no vitals filed for this visit.   Subjective Assessment - 04/28/20 1236    Currently in Pain? No/denies                      Typing test 38 wpm, 90% accuracy Typing activities for increased speed, accuracy and divided attention, clouds- with min difficulty Gripper set at level 2 sustained grip, min difficulty to pick up 1 inch blocks Discussed potential challenges when she returns to work, use of sticky notes to take message, trying to complete 1 task prior to moving to a second task. Therapist recommends pt practices R handed typing words with  radio or TV on for distraction. Grooved pegboard for increased fine motor coordination, min difficulty with RUE, then removing with in hand manipulation. Ambulating while tossing ball between hands and performing category generation, min difficulty/ v.c, for improved multi-tasking             OT Short Term Goals - 04/18/20 1244      OT SHORT TERM GOAL #1   Title Pt will be independent with initial HEP.--check STGs 04/21/20--extended due to decr frequency    Time 4    Period Weeks    Status Achieved      OT SHORT TERM GOAL #2   Title Pt will be able to eat with RUE 100% of the time including cutting meat.    Baseline 4    Period Weeks    Status Achieved  OT SHORT TERM GOAL #3   Title Pt will improve R hand coordination for ADLs as shown by improving time on 9-hole peg test by at least 8sec.    Baseline 33.8sec    Time 4    Period Weeks    Status On-going   26 sec     OT SHORT TERM GOAL #4   Title Pt will demo at least 38lbs R grip strength to assist in opening containers.    Baseline 30.4lbs    Time 4    Period Weeks    Status Achieved   42 lbs     OT SHORT TERM GOAL #5   Title Pt will be able to type at least 35wpm in prep for return to work.    Baseline 22wpm    Time 4    Period Weeks    Status On-going   04/05/20;  28wpm, 04/18/20: 26 wpm 98% accuracy     OT SHORT TERM GOAL #6   Title Pt will be able to shuffle cards for leisure activities.    Time 4    Period Weeks    Status Achieved             OT Long  Term Goals - 04/21/20 1236      OT LONG TERM GOAL #1   Title Pt will be independent with updated HEP.--check LTGs 05/03/20    Time 8    Period Weeks    Status New      OT LONG TERM GOAL #2   Title Pt will demo at least 50lbs R grip strength for leisure tasks.    Baseline 30.4lbs    Time 8    Period Weeks    Status New      OT LONG TERM GOAL #3   Title Pt will be able to type at least 45 wpm in prep for return to work.    Time 8    Period Weeks    Status New      OT LONG TERM GOAL #4   Title Pt will report ability to use RUE as dominat UE for IADL tasks at least 95% of the time.    Baseline 8    Period Weeks    Status New      OT LONG TERM GOAL #5   Title Pt will be able to use RUE for simulated leisure tasks for at least 25 min without significant rest break.    Baseline 8    Period Weeks    Status New      OT LONG TERM GOAL #6   Title Pt will be able to style hair (braid, pony tail) mod I.    Time 8    Period Weeks    Status Achieved   04/21/20:  met with difficulty     OT LONG TERM GOAL #7   Title Pt will be able to write at least 3 sentences within 39mn with no reports of pain in prep to return to work tasks.    Time 8    Period Weeks    Status New                 Plan - 04/28/20 1237    Clinical Impression Statement Pt plans to return to work next week. Pt is progressing towards goals    Occupational performance deficits (Please refer to evaluation for details): ADL's;IADL's    Body Structure / Function / Physical Skills ADL;Dexterity;IADL;Balance;Strength;FMC;Coordination;UE functional  use;Decreased knowledge of use of DME;Pain    Rehab Potential Good    OT Frequency 2x / week    OT Duration 8 weeks    OT Treatment/Interventions Self-care/ADL training;Moist Heat;Aquatic Therapy;DME and/or AE instruction;Therapeutic activities;Therapeutic exercise;Fluidtherapy;Visual/perceptual remediation/compensation;Passive range of motion;Neuromuscular  education;Cryotherapy;Energy conservation;Manual Therapy;Patient/family education    Plan divided attention with coordination/typing, bilateral hand coordination, typing games, update HEP prn    Consulted and Agree with Plan of Care Patient           Patient will benefit from skilled therapeutic intervention in order to improve the following deficits and impairments:   Body Structure / Function / Physical Skills: ADL, Dexterity, IADL, Balance, Strength, FMC, Coordination, UE functional use, Decreased knowledge of use of DME, Pain       Visit Diagnosis: Muscle weakness (generalized)  Other lack of coordination  Other abnormalities of gait and mobility  Hemiplegia and hemiparesis following other cerebrovascular disease affecting right dominant side [facility])    Problem List Patient Active Problem List   Diagnosis Date Noted  . GBM (glioblastoma multiforme) ([place])   . Steroid-induced hyperglycemia   . Seizures ([place])   . Leucocytosis   . Bradycardia   . Status post craniotomy 02/16/2020  . Tick bite 02/08/2020  . History of recent hospitalization 12/21/2019  . Seizure disorder ([place]) 12/21/2019  . Hypokalemia 12/21/2019  . Long-term use of high-risk medication 12/21/2019  . Frontal mass of brain 12/16/2019  . Family history of colon cancer - father 30 05/26/2015  . Colon cancer screening 03/10/2015  . Vitamin D deficiency 03/10/2015    Miia Blanks 04/28/2020, 1:26 PM  [facility] [address] [place], [place], [address] Phone: [phone]   Fax:  [phone]  Name: Hannah [name] MRN: [id number] Date of Birth: [birth year]

## 2020-05-02 ENCOUNTER — Ambulatory Visit: Payer: No Typology Code available for payment source

## 2020-05-03 ENCOUNTER — Encounter: Payer: Self-pay | Admitting: Physical Therapy

## 2020-05-03 ENCOUNTER — Other Ambulatory Visit: Payer: Self-pay

## 2020-05-03 ENCOUNTER — Ambulatory Visit: Payer: No Typology Code available for payment source | Admitting: Physical Therapy

## 2020-05-03 ENCOUNTER — Telehealth: Payer: Self-pay | Admitting: Internal Medicine

## 2020-05-03 DIAGNOSIS — R2689 Other abnormalities of gait and mobility: Secondary | ICD-10-CM

## 2020-05-03 DIAGNOSIS — C711 Malignant neoplasm of frontal lobe: Secondary | ICD-10-CM | POA: Diagnosis not present

## 2020-05-03 DIAGNOSIS — M6281 Muscle weakness (generalized): Secondary | ICD-10-CM

## 2020-05-03 DIAGNOSIS — R278 Other lack of coordination: Secondary | ICD-10-CM

## 2020-05-03 DIAGNOSIS — R2681 Unsteadiness on feet: Secondary | ICD-10-CM

## 2020-05-03 NOTE — Telephone Encounter (Signed)
[id number] [facility] medical records to [place] @ fax# [phone]

## 2020-05-03 NOTE — Therapy (Signed)
[facility] [address] [place], [place], [address] Phone: [phone]   Fax:  [phone]  Physical Therapy Treatment  Patient Details  Name: Hannah [name] MRN: [id number] Date of Birth: [birth year] Referring Provider (PT): Duffy Rhody, MD   Encounter Date: 05/03/2020   PT End of Session - 05/03/20 1813    Visit Number 13    Number of Visits 21    Date for PT Re-Evaluation 04/25/20    Authorization Type Hannah [name] focus is primary and [employer] is secondary - $40 copay and no VL but requires auth after visit 12    PT Start Time 1415    PT Stop Time 1500    PT Time Calculation (min) 45 min    Equipment Utilized During Treatment Other (comment)   buoyancy hand bar bells, ankle buoyancy cuffs   Activity Tolerance Patient tolerated treatment well   some increase in dizziness with certain tasks   Behavior During Therapy [facility] for tasks assessed/performed           Past Medical History:  Diagnosis Date  . Cervical spondylolysis 12/15/2019   Skeleton: Mild cervical spondylosis C6-7. Incidental find.   . Left ankle sprain 10/07/2011  . Seizure ([place])    partial   . Tick bite 02/2020    Past Surgical History:  Procedure Laterality Date  . APPLICATION OF CRANIAL NAVIGATION Left 02/16/2020   Procedure: APPLICATION OF CRANIAL NAVIGATION;  Surgeon: Vallarie Mare, MD;  Location: [facility];  Service: Neurosurgery;  Laterality: Left;  . CRANIOTOMY Left 02/16/2020   Procedure: LEFT FRONTAL CRANIOTOMY FOR BRAIN TUMOR;  Surgeon: Vallarie Mare, MD;  Location: [facility];  Service: Neurosurgery;  Laterality: Left;  . FOOT SURGERY Left    bone spur - local anesthesia  . TONSILECTOMY/ADENOIDECTOMY WITH MYRINGOTOMY     tonsils and adenoids out only  . WISDOM TOOTH EXTRACTION      There were no vitals filed for this visit.   Subjective Assessment - 05/03/20 1808    Subjective Pt reports having "tingling" in R foot.  Has after  surgery but had gone away.  Also reports having an episode of dizziness when leaning back to have hair washed at salon.  Did do some running on trails.  Felt like she was veering to the right but not sure.    Pertinent History No signitifcant history prior to GBM s/p frontal craniotomy 4/14    Patient Stated Goals "I want to get back to running, playing tennis, hiking, biking.  I want to return to work"    Currently in Pain? No/denies           Aquatic therapy at [facility] - pool temp. 87.4 degrees  Patient seen for aquatic therapy today. Treatment took place in water 4 feet deep. Pt entered/exitedsteps  withuse of hand rails with supervision.  Pt performed bil.hamstring and heel cord stretch - runner's stretch 30 sec hold x 1 rep; heel cord stretch with toes/forefoot on pool wall 30 sec hold x 1 rep.  Performed at beginning, middle and end of session.  Pt performed water walking forwards, backwards and sideways 2 reps32mx 2 reps eachacross pool with cues for reciprocal arm movement; Had pt place UE's under water for increased UE resistance for strengthening.  Sideways stepping with squats20mx 2 repswith UE bar bells for resistance.  Forward walking with arm bar bells slightly under water 3m x 2.  Marching forwards 86m moving arm with bar bells to  opposite leg then repeated 33m backwards.  Squats bil LE x 15 reps without UE support then single leg x 15 reps with UE support of pool edge.  Braiding 82m x 2 reps.  Tandem gait 34m x 1 rep forward then 68m x 1 rep backwards  Half jumping jacks with LE's only x 10 reps x 2 with second set with increased speed.  Needed rest break in between and did cause slight dizziness while performing.              With bil ankle buoyancy cuffs for bil hip abd/add, marching, hip extension, hip flexion moving directly into extension, leg circles both directions and hip add across midline to wall.  Performed 15 reps each alternating LE's with  rare UE support needed of wall.   Pt requires the viscosity of the water for resistance with strengthening exercises High level balance can be performed in water with minimal fall risk in the water, which is unable for pt to perform on land with the reduced fall risk Water current provides perturbations which challenge balance during activites as well.     PT Long Term Goals - 04/19/20 2133      PT LONG TERM GOAL #1   Title Pt will be independent with final land and aquatic HEP in order to indicate decreased fall risk and improved functional mobility.    Time 6    Period Weeks    Status Revised    Target Date 06/03/20      PT LONG TERM GOAL #2   Title --    Baseline --    Time --    Period --    Status --      PT LONG TERM GOAL #3   Title Pt will improve FGA to >/=28/30 in order to indicate decreased fall risk.    Baseline 26/30    Time 6    Period Weeks    Status Revised    Target Date 06/03/20      PT LONG TERM GOAL #4   Title Pt will ambulate >1000' over varying outdoor surfaces independently while scanning environment and making L and R turns without LOB in order to indicate safe community mobility.    Time 6    Period Weeks    Status Revised    Target Date 06/03/20      PT LONG TERM GOAL #5   Title Pt will demonstrate ability to begin jogging, hiking, [name]    Baseline has been hiking, is getting on her bike this afternoon.  No jogging    Time 6    Period Weeks    Status Revised    Target Date 06/03/20      PT LONG TERM GOAL #6   Title --    Baseline --    Time --    Period --    Status --      PT LONG TERM GOAL #7   Title Patient will report no dizziness with bed mobility, quick head and body turns, and bending down to the ground    Baseline has decreased to 1/5 overall    Time 6    Period Weeks    Status Revised    Target Date 06/03/20                 Plan -  05/03/20 1814    Clinical Impression Statement Skilled  session continues to focus on endurance, balance, strength and coordination.  Pt did have some dizziness with pylometric activities today.  Continues to report benefit from pool sessions and has been gradually increasing her activity outside ot therapy.  Cont per poc.    Personal Factors and Comorbidities Comorbidity 1;Social Background;Profession;Fitness;Transportation    Comorbidities craniotomy    Examination-Activity Limitations Caring for Others;Carry;Lift;Locomotion Level;Reach Overhead;Stairs;Stand    Examination-Participation Restrictions Community Activity;Driving;Yard Work;Meal Prep    Rehab Potential Excellent    PT Frequency 1x / week    PT Duration 6 weeks    PT Treatment/Interventions ADLs/Self Care Home Management;Aquatic Therapy;Gait training;Stair training;Functional mobility training;Therapeutic activities;Balance training;Therapeutic exercise;Neuromuscular re-education;Cognitive remediation;Patient/family education;Passive range of motion;Vestibular    PT Next Visit Plan Continue with aquatic therapy x 2; return to land for goal assessment and finalize HEP. continue to work on large step ups for hiking, balance with eyes closed, bending down to ground, endurance training    Consulted and Agree with Plan of Care Patient           Patient will benefit from skilled therapeutic intervention in order to improve the following deficits and impairments:  Abnormal gait, Decreased activity tolerance, Decreased balance, Decreased cognition, Decreased coordination, Decreased endurance, Decreased mobility, Decreased strength, Dizziness, Impaired sensation, Impaired UE functional use, Postural dysfunction  Visit Diagnosis: Muscle weakness (generalized)  Unsteadiness on feet  Other lack of coordination  Other abnormalities of gait and mobility     Problem List Patient Active Problem List   Diagnosis Date Noted  . GBM  (glioblastoma multiforme) ([place])   . Steroid-induced hyperglycemia   . Seizures ([place])   . Leucocytosis   . Bradycardia   . Status post craniotomy 02/16/2020  . Tick bite 02/08/2020  . History of recent hospitalization 12/21/2019  . Seizure disorder ([place]) 12/21/2019  . Hypokalemia 12/21/2019  . Long-term use of high-risk medication 12/21/2019  . Frontal mass of brain 12/16/2019  . Family history of colon cancer - father 7 05/26/2015  . Colon cancer screening 03/10/2015  . Vitamin D deficiency 03/10/2015    Hannah [name], PTA [facility] 05/03/20 6:26 PM Phone: [phone] Fax: [facility] [address] [place], [place], [address] Phone: [phone]   Fax:  [phone]  Name: Hannah [name] MRN: [id number] Date of Birth: Apr 02, 1961

## 2020-05-10 ENCOUNTER — Encounter: Payer: Self-pay | Admitting: Physical Therapy

## 2020-05-10 ENCOUNTER — Other Ambulatory Visit: Payer: Self-pay

## 2020-05-10 ENCOUNTER — Ambulatory Visit: Payer: No Typology Code available for payment source | Attending: Neurosurgery | Admitting: Physical Therapy

## 2020-05-10 DIAGNOSIS — R2689 Other abnormalities of gait and mobility: Secondary | ICD-10-CM | POA: Diagnosis present

## 2020-05-10 DIAGNOSIS — M6281 Muscle weakness (generalized): Secondary | ICD-10-CM | POA: Insufficient documentation

## 2020-05-10 DIAGNOSIS — R278 Other lack of coordination: Secondary | ICD-10-CM | POA: Diagnosis present

## 2020-05-10 DIAGNOSIS — R2681 Unsteadiness on feet: Secondary | ICD-10-CM | POA: Diagnosis present

## 2020-05-10 NOTE — Therapy (Signed)
[facility] [address] [place], [place], [address] Phone: [phone]   Fax:  [phone]  Physical Therapy Treatment  Patient Details  Name: Hannah [name] MRN: [id number] Date of Birth: 12-13-1960 Referring Provider (PT): Hannah Rhody, MD   Encounter Date: 05/10/2020   PT End of Session - 05/10/20 1758    Visit Number 14    Number of Visits 21    Date for PT Re-Evaluation 06/03/20    Authorization Type [name]    PT Start Time 1500    PT Stop Time 1545    PT Time Calculation (min) 45 min    Equipment Utilized During Treatment Other (comment)   buoyancy hand bar bells, ankle buoyancy cuffs   Activity Tolerance Patient tolerated treatment well   some increase in dizziness with certain tasks   Behavior During Therapy [facility] for tasks assessed/performed           Past Medical History:  Diagnosis Date  . Cervical spondylolysis 12/15/2019   Skeleton: Mild cervical spondylosis C6-7. Incidental find.   . Left ankle sprain 10/07/2011  . Seizure ([place])    partial   . Tick bite 02/2020    Past Surgical History:  Procedure Laterality Date  . APPLICATION OF CRANIAL NAVIGATION Left 02/16/2020   Procedure: APPLICATION OF CRANIAL NAVIGATION;  Surgeon: Vallarie Mare, MD;  Location: [facility];  Service: Neurosurgery;  Laterality: Left;  . CRANIOTOMY Left 02/16/2020   Procedure: LEFT FRONTAL CRANIOTOMY FOR BRAIN TUMOR;  Surgeon: Vallarie Mare, MD;  Location: [facility];  Service: Neurosurgery;  Laterality: Left;  . FOOT SURGERY Left    bone spur - local anesthesia  . TONSILECTOMY/ADENOIDECTOMY WITH MYRINGOTOMY     tonsils and adenoids out only  . WISDOM TOOTH EXTRACTION      There were no vitals filed for this visit.   Subjective Assessment - 05/10/20 1757    Subjective Denies any falls or changes.  Still feels challenged  in pool with coordination activities and motor planning.    Pertinent History No signitifcant history prior to GBM s/p frontal craniotomy 4/14    Patient Stated Goals "I want to get back to running, playing tennis, hiking, biking.  I want to return to work"    Currently in Pain? No/denies           Aquatic therapy at [facility] - pool temp. 87.4 degrees  Patient seen for aquatic therapy today. Treatment took place in water 4 feet deep. Pt entered/exitedstepswithuse of hand rails with supervision.  Pt performed bil.hamstring and heel cord stretch - runner's stretch 30 sec hold x 1 rep; heel cord stretch with toes/forefoot on pool wall 30 sec hold x 1 rep. Performed at beginning and end of session.  Pt performed water walking forwards, backwards and sideways 2 reps75mx 2 reps eachacross pool with cues for reciprocal arm movement; Had pt place UE's under water for increased UE resistance for strengthening.  Sideways stepping with squats31mx 2 repswith UE bar bells for resistance. Marching forwards 61m moving arm with bar bells to opposite leg then repeated 53m backwards.  Squats bil LE x 15reps without UE supportthen single leg x 15reps with UE support of pool edge.  Braiding 59m x 2 reps.  Tandem gait 49m x 1 rep forward then 5m x 1 rep backwards  Half jumping jacks with LE's only  x 10 reps then forward lunge jumping jacks x 10 reps   Forward step weight shifts x 10 reps RLE then LLE.  Lateral step weight shift step x 10 reps each direction.  Ai Chi postures of Enclosing, Soothing, Freeing, and Rounding x 15 reps each.  Pt with mild c/o dizziness with Freeing posture with increased reps.   Pt requires the viscosity of the water for resistance with strengthening exercises High level balance can be performed in water with minimal fall risk in the water, which is unable for pt to perform on land with the reduced fall risk Water current provides  perturbations which challenge balance during activites as well.   PT Short Term Goals - 04/19/20 2133      PT SHORT TERM GOAL #1   Title = LTG             PT Long Term Goals - 04/19/20 2133      PT LONG TERM GOAL #1   Title [name]    Time 6    Period Weeks    Status Revised    Target Date 06/03/20      PT LONG TERM GOAL #2   Title --    Baseline --    Time --    Period --    Status --      PT LONG TERM GOAL #3   Title [name]    Baseline 26/30    Time 6    Period Weeks    Status Revised    Target Date 06/03/20      PT LONG TERM GOAL #4   Title [name]    Time 6    Period Weeks    Status Revised    Target Date 06/03/20      PT LONG TERM GOAL #5   Title [name], hiking, [name]    Baseline has been hiking, is getting on her bike this afternoon.  No jogging    Time 6    Period Weeks    Status Revised    Target Date 06/03/20      PT LONG TERM GOAL #6   Title --    Baseline --    Time --    Period --    Status --      PT LONG TERM GOAL #7   Title [name], quick head and body turns, and bending down to the ground    Baseline has decreased to 1/5 overall    Time 6    Period Weeks    Status Revised    Target Date 06/03/20                 Plan - 05/10/20 1759    Clinical Impression Statement Pt continues to report difficulty with coordination and motor planning activities.  Feels "fatigued" after session due to having to concentrate so hard on  movements.  Continues to also be challenged with balance activities and mild c/o dizziness with rotation activities today.  Cont  per poc.    Personal Factors and Comorbidities Comorbidity 1;Social Background;Profession;Fitness;Transportation    Comorbidities craniotomy    Examination-Activity Limitations Caring for Others;Carry;Lift;Locomotion Level;Reach Overhead;Stairs;Stand    Examination-Participation Restrictions Community Activity;Driving;Yard Work;Meal Prep    Rehab Potential Excellent    PT Frequency 1x / week    PT Duration 6 weeks    PT Treatment/Interventions ADLs/Self Care Home Management;Aquatic Therapy;Gait training;Stair training;Functional mobility training;Therapeutic activities;Balance training;Therapeutic exercise;Neuromuscular re-education;Cognitive remediation;Patient/family education;Passive range of motion;Vestibular    PT Next Visit Plan Continue with aquatic therapy x 1; return to land for goal assessment and finalize HEP. continue to work on large step ups for hiking, balance with eyes closed, bending down to ground, endurance training    Consulted and Agree with Plan of Care Patient           Patient Hannah benefit from skilled therapeutic intervention in order to improve the following deficits and impairments:  Abnormal gait, Decreased activity tolerance, Decreased balance, Decreased cognition, Decreased coordination, Decreased endurance, Decreased mobility, Decreased strength, Dizziness, Impaired sensation, Impaired UE functional use, Postural dysfunction  Visit Diagnosis: Unsteadiness on feet  Muscle weakness (generalized)  Other lack of coordination  Other abnormalities of gait and mobility     Problem List Patient Active Problem List   Diagnosis Date Noted  . GBM (glioblastoma multiforme) ([place])   . Steroid-induced hyperglycemia   . Seizures ([place])   . Leucocytosis   . Bradycardia   . Status post craniotomy 02/16/2020  . Tick bite 02/08/2020  .  History of recent hospitalization 12/21/2019  . Seizure disorder ([place]) 12/21/2019  . Hypokalemia 12/21/2019  . Long-term use of high-risk medication 12/21/2019  . Frontal mass of brain 12/16/2019  . Family history of colon cancer - father 72 05/26/2015  . Colon cancer screening 03/10/2015  . Vitamin D deficiency 03/10/2015    Narda Bonds, PTA [facility] 05/10/20 6:04 PM Phone: [phone] Fax: [facility] [address] [place], [place], [address] Phone: [phone]   Fax:  [phone]  Name: Hannah [name] MRN: [id number] Date of Birth: [birth year]

## 2020-05-11 MED FILL — levETIRAcetam 250 MG TABS: 250 | 30 days supply | Qty: 60 | Fill #1

## 2020-05-22 ENCOUNTER — Ambulatory Visit: Payer: No Typology Code available for payment source | Admitting: Physical Therapy

## 2020-05-24 ENCOUNTER — Telehealth: Payer: Self-pay | Admitting: *Deleted

## 2020-05-24 NOTE — Telephone Encounter (Signed)
Hannah [name] left a message stating that [place] needs an End Date noted for her restrictions. It can be "indefinitely", but needs to be documented.   [place] Amber in [place] @ [facility]: [phone], [phone]

## 2020-05-25 ENCOUNTER — Telehealth: Payer: Self-pay | Admitting: *Deleted

## 2020-05-25 NOTE — Telephone Encounter (Signed)
Letter created for [place] department to include end date of work restrictions for patient.  Faxed to [employer] [phone].  Phone to call back [phone]

## 2020-05-31 ENCOUNTER — Telehealth: Payer: Self-pay

## 2020-05-31 NOTE — Telephone Encounter (Signed)
Received voicemail from patient stating that she would like to know if her appointment tomorrow with Dr Mickeal Skinner @2  was a virtual visit since she does not have transportation. Returned TC to patient. Unable to reach her. Left voicemail stating that she it is not showing that she has an appointment with Dr Mickeal Skinner at all tomorrow and if she had any question please give me a call back at [phone]

## 2020-06-01 ENCOUNTER — Other Ambulatory Visit: Payer: Self-pay

## 2020-06-01 ENCOUNTER — Ambulatory Visit: Payer: No Typology Code available for payment source | Admitting: Occupational Therapy

## 2020-06-01 DIAGNOSIS — R278 Other lack of coordination: Secondary | ICD-10-CM

## 2020-06-01 DIAGNOSIS — R2681 Unsteadiness on feet: Secondary | ICD-10-CM | POA: Diagnosis not present

## 2020-06-01 DIAGNOSIS — M6281 Muscle weakness (generalized): Secondary | ICD-10-CM

## 2020-06-01 NOTE — Therapy (Signed)
[facility] [address] [place], [place], [address] Phone: [phone]   Fax:  [phone]  Occupational Therapy Treatment  Patient Details  Name: Hannah [name] MRN: [id number] Date of Birth: November 13, 1960 Referring Provider (OT): Dr. Dorcas Carrow. Marcello Moores   Encounter Date: 06/01/2020   OT End of Session - 06/01/20 1435    Visit Number 7    Number of Visits 17    Date for OT Re-Evaluation 06/18/20   extended due to missed visits/weeks   Authorization Type [facility] (?primary):  $40 copay each discipline, auth required after 12th visit.  [place] 2021: $25 copay (1 per day), 20 visit limit OT (each), no auth required    Authorization - Visit Number 7    Authorization - Number of Visits 20    Progress Note Due on Visit 12    OT Start Time 1400    OT Stop Time 1430   Pt had to leave 15 minutes early   OT Time Calculation (min) 30 min    Activity Tolerance Patient tolerated treatment well    Behavior During Therapy WFL for tasks assessed/performed           Past Medical History:  Diagnosis Date  . Cervical spondylolysis 12/15/2019   Skeleton: Mild cervical spondylosis C6-7. Incidental find.   . Left ankle sprain 10/07/2011  . Seizure ([place])    partial   . Tick bite 02/2020    Past Surgical History:  Procedure Laterality Date  . APPLICATION OF CRANIAL NAVIGATION Left 02/16/2020   Procedure: APPLICATION OF CRANIAL NAVIGATION;  Surgeon: Vallarie Mare, MD;  Location: [facility];  Service: Neurosurgery;  Laterality: Left;  . CRANIOTOMY Left 02/16/2020   Procedure: LEFT FRONTAL CRANIOTOMY FOR BRAIN TUMOR;  Surgeon: Vallarie Mare, MD;  Location: [facility];  Service: Neurosurgery;  Laterality: Left;  . FOOT SURGERY Left    bone spur - local anesthesia  . TONSILECTOMY/ADENOIDECTOMY WITH MYRINGOTOMY     tonsils and adenoids out only  . WISDOM TOOTH EXTRACTION      There were no vitals filed for this visit.    Subjective Assessment - 06/01/20 1404    Subjective  I've gone back to work; [employer] working 25 hours/week ([employer] primary care at [place]). My doctor also reduced my Keppra which appears to have helped my speech. My typing is going well, but my handwriting is still a struggle    Pertinent History glioblastoma s/p craniotomy for resection 02/16/20, hx of seizure    Patient Stated Goals to be able to type with R hand to go back to work, return to playing tennis, be able to use RUE to push up from a seated position (beach chair), kayak    Currently in Pain? No/denies           See subjective above Assessed goals and progress to date as pt has not been seen in over a month and in prep for potential d/c next session.  Pt has improved in coordination, grip strength, and typing since last assessed:  Rt grip = 53 lbs, 9 hole peg test = 21.37 sec, typing speed = 31 wpm, 96% accuracy Pt practiced writing w/ slightly altered pincer grasp (per pt report) - handwriting is legible although slightly smaller, but appears to also have deficits in motor planning.  Pt requested to end session early due to daughter's schedule (who provided transportation today)  OT Short Term Goals - 06/01/20 1436      OT SHORT TERM GOAL #1   Title Pt will be independent with initial HEP.--check STGs 04/21/20--extended due to decr frequency    Time 4    Period Weeks    Status Achieved      OT SHORT TERM GOAL #2   Title Pt will be able to eat with RUE 100% of the time including cutting meat.    Baseline 4    Period Weeks    Status Achieved      OT SHORT TERM GOAL #3   Title Pt will improve R hand coordination for ADLs as shown by improving time on 9-hole peg test by at least 8sec.    Baseline 33.8sec    Time 4    Period Weeks    Status Achieved   26 sec, 06/01/20: 21.37 sec     OT SHORT TERM GOAL #4   Title Pt will demo at least 38lbs R grip strength to assist in  opening containers.    Baseline 30.4lbs    Time 4    Period Weeks    Status Achieved   42 lbs     OT SHORT TERM GOAL #5   Title Pt will be able to type at least 35wpm in prep for return to work.    Baseline 22wpm    Time 4    Period Weeks    Status Not Met   04/05/20;  28wpm, 04/18/20: 26 wpm 98% accuracy, 06/01/20: 31 wpm, 96% accuracy     OT SHORT TERM GOAL #6   Title Pt will be able to shuffle cards for leisure activities.    Time 4    Period Weeks    Status Achieved             OT Long Term Goals - 06/01/20 1437      OT LONG TERM GOAL #1   Title Pt will be independent with updated HEP.--check LTGs 05/03/20    Time 8    Period Weeks    Status New      OT LONG TERM GOAL #2   Title Pt will demo at least 50lbs R grip strength for leisure tasks.    Baseline 30.4lbs    Time 8    Period Weeks    Status Achieved   53 lbs     OT LONG TERM GOAL #3   Title Pt will be able to type at least 45 wpm in prep for return to work.    Time 8    Period Weeks    Status Not Met   31 wpm, 96% accuracy     OT LONG TERM GOAL #4   Title Pt will report ability to use RUE as dominat UE for IADL tasks at least 95% of the time.    Baseline 8    Period Weeks    Status On-going   06/01/20: 90%     OT LONG TERM GOAL #5   Title Pt will be able to use RUE for simulated leisure tasks for at least 25 min without significant rest break.    Baseline 8    Period Weeks    Status New      OT LONG TERM GOAL #6   Title Pt will be able to style hair (braid, pony tail) mod I.    Time 8    Period Weeks    Status Achieved   04/21/20:  met  with difficulty     OT LONG TERM GOAL #7   Title Pt will be able to write at least 3 sentences within 4mn with no reports of pain in prep to return to work tasks.    Time 8    Period Weeks    Status On-going                 Plan - 06/01/20 1439    Clinical Impression Statement Pt has returned to work and reports doing fine with this. Pt reports typing is  fine for work related tasks althought slower. Pt still reports difficulty writing, however appears more due to motor planning deficits than coordination    Occupational performance deficits (Please refer to evaluation for details): ADL's;IADL's    Body Structure / Function / Physical Skills ADL;Dexterity;IADL;Balance;Strength;FMC;Coordination;UE functional use;Decreased knowledge of use of DME;Pain    Rehab Potential Good    OT Frequency 2x / week    OT Duration 8 weeks    OT Treatment/Interventions Self-care/ADL training;Moist Heat;Aquatic Therapy;DME and/or AE instruction;Therapeutic activities;Therapeutic exercise;Fluidtherapy;Visual/perceptual remediation/compensation;Passive range of motion;Neuromuscular education;Cryotherapy;Energy conservation;Manual Therapy;Patient/family education    Plan divided attention with coordination/typing, check remaining goals and anticipate d/c next session. If pt decides to continue - will need more visits scheduled    Consulted and Agree with Plan of Care Patient           Patient will benefit from skilled therapeutic intervention in order to improve the following deficits and impairments:   Body Structure / Function / Physical Skills: ADL, Dexterity, IADL, Balance, Strength, FMC, Coordination, UE functional use, Decreased knowledge of use of DME, Pain       Visit Diagnosis: Other lack of coordination  Muscle weakness (generalized)    Problem List Patient Active Problem List   Diagnosis Date Noted  . GBM (glioblastoma multiforme) ([facility]   . Steroid-induced hyperglycemia   . Seizures ([place]   . Leucocytosis   . Bradycardia   . Status post craniotomy 02/16/2020  . Tick bite 02/08/2020  . History of recent hospitalization 12/21/2019  . Seizure disorder ([place] 12/21/2019  . Hypokalemia 12/21/2019  . Long-term use of high-risk medication 12/21/2019  . Frontal mass of brain 12/16/2019  . Family history of colon cancer - father 56307/22/2016  .  Colon cancer screening 03/10/2015  . Vitamin D deficiency 03/10/2015    BCarey Bullocks OTR/L 06/01/2020, 2:41 PM  [place] [address]Phone: [phone]  Fax:  [phone] Name: DSUMIE REMSENMRN: [id number] of Birth: [birth year]

## 2020-06-02 ENCOUNTER — Inpatient Hospital Stay: Payer: No Typology Code available for payment source | Attending: Internal Medicine | Admitting: Internal Medicine

## 2020-06-02 ENCOUNTER — Encounter: Payer: Self-pay | Admitting: Physical Therapy

## 2020-06-02 DIAGNOSIS — C719 Malignant neoplasm of brain, unspecified: Secondary | ICD-10-CM

## 2020-06-02 DIAGNOSIS — R569 Unspecified convulsions: Secondary | ICD-10-CM

## 2020-06-02 NOTE — Progress Notes (Signed)
I connected with Hannah [name] on 06/02/20 at  2:00 PM EDT by telephone visit and verified that I am speaking with the correct person using two identifiers.  I discussed the limitations, risks, security and privacy concerns of performing an evaluation and management service by telemedicine and the availability of in-person appointments. I also discussed with the patient that there may be a patient responsible charge related to this service. The patient expressed understanding and agreed to proceed.  Other persons participating in the visit and their role in the encounter:  n/a  Patient's location:  Home  Provider's location:  Office  Chief Complaint:  GBM (glioblastoma multiforme) ([place])  Seizures ([place])   History of Present Ilness: Hannah [name] describes no new or progressive neurologic deficits.  No recurrence of seizures, now almost 6 months seizure-free.  Still unsure about proceeding with radiation and/or chemotherapy, she would like a second opinion at [facility] before proceeding further.   Observations: Language and cognition at baseline Assessment and Plan: GBM (glioblastoma multiforme) ([place])  Seizures ([place])  Still recommending initiation of radiation and concurrent [place] as discussed earlier.  Consultation at [facility] could provide access to clinical trials not offered or available here in [place].  She is clinically safe to drive if seizure free on 06/13/20.  Follow Up Instructions: RTC following planned [facility] consult  I discussed the assessment and treatment plan with the patient.  The patient was provided an opportunity to ask questions and all were answered.  The patient agreed with the plan and demonstrated understanding of the instructions.    The patient was advised to call back or seek an in-person evaluation if the symptoms worsen or if the condition fails to improve as anticipated.  I provided 5-10 minutes of non-face-to-face time during this enocunter.  Hannah Sellers,  MD   I provided 15 minutes of non face-to-face telephone visit time during this encounter, and > 50% was spent counseling as documented under my assessment & plan.

## 2020-06-05 ENCOUNTER — Telehealth: Payer: Self-pay | Admitting: *Deleted

## 2020-06-05 DIAGNOSIS — Z6826 Body mass index (BMI) 26.0-26.9, adult: Secondary | ICD-10-CM | POA: Insufficient documentation

## 2020-06-05 NOTE — Telephone Encounter (Signed)
Faxed Letter prepared on 06/02/2020 to [facility]

## 2020-06-06 ENCOUNTER — Encounter: Payer: Self-pay | Admitting: Occupational Therapy

## 2020-06-06 ENCOUNTER — Other Ambulatory Visit: Payer: Self-pay

## 2020-06-06 ENCOUNTER — Ambulatory Visit: Payer: No Typology Code available for payment source | Attending: Neurosurgery | Admitting: Occupational Therapy

## 2020-06-06 DIAGNOSIS — R278 Other lack of coordination: Secondary | ICD-10-CM | POA: Diagnosis present

## 2020-06-06 DIAGNOSIS — R2681 Unsteadiness on feet: Secondary | ICD-10-CM | POA: Insufficient documentation

## 2020-06-06 DIAGNOSIS — R42 Dizziness and giddiness: Secondary | ICD-10-CM | POA: Insufficient documentation

## 2020-06-06 DIAGNOSIS — R2689 Other abnormalities of gait and mobility: Secondary | ICD-10-CM | POA: Insufficient documentation

## 2020-06-06 DIAGNOSIS — M6281 Muscle weakness (generalized): Secondary | ICD-10-CM | POA: Diagnosis present

## 2020-06-06 NOTE — Therapy (Addendum)
[facility] [address] [place], [place], [address] Phone: [phone]   Fax:  [phone]  Occupational Therapy Treatment  Patient Details  Name: Hannah [name] MRN: [id number] Date of Birth: 1961-08-23 Referring Provider (OT): Dr. Dorcas Carrow. Marcello Moores   Encounter Date: 06/06/2020   OT End of Session - 06/06/20 1427    Visit Number 8    Number of Visits 17    Date for OT Re-Evaluation 06/18/20   extended due to missed visits/weeks   Authorization Type [facility] [facility] (?primary):  $40 copay each discipline, auth required after 12th visit.  [place] 2021: $25 copay (1 per day), 20 visit limit OT (each), no auth required    Authorization - Visit Number 8    Authorization - Number of Visits 20    Progress Note Due on Visit 12    OT Start Time 1320    OT Stop Time 1400    OT Time Calculation (min) 40 min    Activity Tolerance Patient tolerated treatment well    Behavior During Therapy WFL for tasks assessed/performed           Past Medical History:  Diagnosis Date  . Cervical spondylolysis 12/15/2019   Skeleton: Mild cervical spondylosis C6-7. Incidental find.   . Left ankle sprain 10/07/2011  . Seizure ([place])    partial   . Tick bite 02/2020    Past Surgical History:  Procedure Laterality Date  . APPLICATION OF CRANIAL NAVIGATION Left 02/16/2020   Procedure: APPLICATION OF CRANIAL NAVIGATION;  Surgeon: Vallarie Mare, MD;  Location: [facility];  Service: Neurosurgery;  Laterality: Left;  . CRANIOTOMY Left 02/16/2020   Procedure: LEFT FRONTAL CRANIOTOMY FOR BRAIN TUMOR;  Surgeon: Vallarie Mare, MD;  Location: [facility];  Service: Neurosurgery;  Laterality: Left;  . FOOT SURGERY Left    bone spur - local anesthesia  . TONSILECTOMY/ADENOIDECTOMY WITH MYRINGOTOMY     tonsils and adenoids out only  . WISDOM TOOTH EXTRACTION      There were no vitals filed for this visit.   Subjective Assessment - 06/06/20 1322     Subjective  They are changing my role at work.  My supervior is working with me.  Pt reports that she has played pickleball and went kayaking and it went well.  Pt reports no difficulty using RUE to get up from floor/seated position    Pertinent History glioblastoma s/p craniotomy for resection 02/16/20, hx of seizure    Patient Stated Goals to be able to type with R hand to go back to work, return to playing tennis, be able to use RUE to push up from a seated position (beach chair), kayak    Currently in Pain? No/denies            Checked remaining goals and long discussion regarding progress towards goals and continuing difficulties/compensations.  Pt reports work is going well.  She is doing prior exercise and leisure activities including pickle ball and kayaking, going to the beach and is doing ok.  Pt report that she is going to play in tennis league at end of the month.  Pt reports mild concerns with serving due to difficulty/dizziness looking up, particularly for long periods of time.  Recommended that pt practice serving/try playing prior to league.  Pt reports that she had difficulty painting overhead and needed frequent breaks to look down.  Pt reports that she has ran without difficulty.  Pt reports that she will  be cleared to drive medically soon (>73month from seizure).  Recommended that pt go to a parking lot with someone to make sure she feels comfortable and then drive on residential roads initially.   Pt appears to be compensating well for remaining mild deficits.  Discussed indications for return to therapy in the future and that she can request OT referral from doctor if she experiences functional changes.     Pt able to generate and write 3 sentences in less than 278m with 100% legibility.  Pt reports that she does not write frequently.  Discussed that writing may be affected by word finding/thinking about what she wants to say, but writing speed has significantly improved.          OT Short Term Goals - 06/01/20 1436      OT SHORT TERM GOAL #1   Title Pt will be independent with initial HEP.--check STGs 04/21/20--extended due to decr frequency    Time 4    Period Weeks    Status Achieved      OT SHORT TERM GOAL #2   Title Pt will be able to eat with RUE 100% of the time including cutting meat.    Baseline 4    Period Weeks    Status Achieved      OT SHORT TERM GOAL #3   Title Pt will improve R hand coordination for ADLs as shown by improving time on 9-hole peg test by at least 8sec.    Baseline 33.8sec    Time 4    Period Weeks    Status Achieved   26 sec, 06/01/20: 21.37 sec     OT SHORT TERM GOAL #4   Title Pt will demo at least 38lbs R grip strength to assist in opening containers.    Baseline 30.4lbs    Time 4    Period Weeks    Status Achieved   42 lbs     OT SHORT TERM GOAL #5   Title Pt will be able to type at least 35wpm in prep for return to work.    Baseline 22wpm    Time 4    Period Weeks    Status Not Met   04/05/20;  28wpm, 04/18/20: 26 wpm 98% accuracy, 06/01/20: 31 wpm, 96% accuracy     OT SHORT TERM GOAL #6   Title Pt will be able to shuffle cards for leisure activities.    Time 4    Period Weeks    Status Achieved             OT Long Term Goals - 06/06/20 1336      OT LONG TERM GOAL #1   Title Pt will be independent with updated HEP.--check LTGs 05/03/20    Time 8    Period Weeks    Status Deferred   06/06/20:  not needed at this time as pt has resumed prior exercise/leisure activities.     OT LONG TERM GOAL #2   Title Pt will demo at least 50lbs R grip strength for leisure tasks.    Baseline 30.4lbs    Time 8    Period Weeks    Status Achieved   53 lbs     OT LONG TERM GOAL #3   Title Pt will be able to type at least 45 wpm in prep for return to work.    Time 8    Period Weeks    Status Not Met   31 wpm, 96% accuracy;  however, pt reports this is functional for work role     OT LONG TERM GOAL #4   Title Pt  will report ability to use RUE as dominat UE for IADL tasks at least 95% of the time.    Baseline 8    Period Weeks    Status Achieved   06/01/20: 90%.  06/06/20:  95-100%     OT LONG TERM GOAL #5   Title Pt will be able to use RUE for simulated leisure tasks for at least 25 min without significant rest break.    Baseline 8    Period Weeks    Status Achieved   played pickleball for >1hr     OT LONG TERM GOAL #6   Title Pt will be able to style hair (braid, pony tail) mod I.    Time 8    Period Weeks    Status Achieved   04/21/20:  met with difficulty     OT LONG TERM GOAL #7   Title Pt will be able to write at least 3 sentences within 59mn with no reports of pain in prep to return to work tasks.    Time 8    Period Weeks    Status Achieved   06/06/20                Plan - 06/06/20 1427    Clinical Impression Statement Pt has returned to exercise/leisure and work tasks.  Pt reports work is going well and that her role is being changed to address accomodations.  Pt is appropriate for d/c at this time.    Occupational performance deficits (Please refer to evaluation for details): ADL's;IADL's    Body Structure / Function / Physical Skills ADL;Dexterity;IADL;Balance;Strength;FMC;Coordination;UE functional use;Decreased knowledge of use of DME;Pain    Rehab Potential Good    OT Frequency 2x / week    OT Duration 8 weeks    OT Treatment/Interventions Self-care/ADL training;Moist Heat;Aquatic Therapy;DME and/or AE instruction;Therapeutic activities;Therapeutic exercise;Fluidtherapy;Visual/perceptual remediation/compensation;Passive range of motion;Neuromuscular education;Cryotherapy;Energy conservation;Manual Therapy;Patient/family education    Plan d/c OT    Consulted and Agree with Plan of Care Patient           Patient will benefit from skilled therapeutic intervention in order to improve the following deficits and impairments:   Body Structure / Function / Physical Skills: ADL,  Dexterity, IADL, Balance, Strength, FMC, Coordination, UE functional use, Decreased knowledge of use of DME, Pain       Visit Diagnosis: Other lack of coordination  Muscle weakness (generalized)    Problem List Patient Active Problem List   Diagnosis Date Noted  . GBM (glioblastoma multiforme) ([facility]   . Steroid-induced hyperglycemia   . Seizures ([facility]   . Leucocytosis   . Bradycardia   . Status post craniotomy 02/16/2020  . Tick bite 02/08/2020  . History of recent hospitalization 12/21/2019  . Seizure disorder ([facility] 12/21/2019  . Hypokalemia 12/21/2019  . Long-term use of high-risk medication 12/21/2019  . Frontal mass of brain 12/16/2019  . Family history of colon cancer - father 52707/22/2016  . Colon cancer screening 03/10/2015  . Vitamin D deficiency 03/10/2015      OCCUPATIONAL THERAPY DISCHARGE SUMMARY  Visits from Start of Care: 8  Current functional level related to goals / functional outcomes: See above    Remaining deficits: Very mild decr coordination ([facility], Pt demo improved coordination, strength and RUE functional use.  Pt continues to demo very mild cognitive-language difficulties, but improved.  Education / Equipment: Pt instructed in HEP, strategies for ADLs  Plan: Patient agrees to discharge.  Patient goals were partially met. Patient is being discharged due to being pleased with the current functional level.  ?????         [facility] 06/06/2020, 2:39 PM  [facility] [address] [place], [place], [address] Phone: [phone]   Fax:  [phone]  Name: Hannah [name] MRN: [id number] Date of Birth: October 30, 1961   Vianne Bulls, OTR/L [facility] [address] [place], [place]  [address] [phone] phone [phone] 06/06/20 2:39 PM

## 2020-06-15 ENCOUNTER — Encounter: Payer: Self-pay | Admitting: Physical Therapy

## 2020-06-15 ENCOUNTER — Other Ambulatory Visit: Payer: Self-pay

## 2020-06-15 ENCOUNTER — Ambulatory Visit: Payer: No Typology Code available for payment source | Admitting: Physical Therapy

## 2020-06-15 DIAGNOSIS — R278 Other lack of coordination: Secondary | ICD-10-CM

## 2020-06-15 DIAGNOSIS — M6281 Muscle weakness (generalized): Secondary | ICD-10-CM

## 2020-06-15 DIAGNOSIS — R2689 Other abnormalities of gait and mobility: Secondary | ICD-10-CM

## 2020-06-15 DIAGNOSIS — R42 Dizziness and giddiness: Secondary | ICD-10-CM

## 2020-06-15 DIAGNOSIS — R2681 Unsteadiness on feet: Secondary | ICD-10-CM

## 2020-06-15 MED FILL — levETIRAcetam 250 MG TABS: 250 | 30 days supply | Qty: 60 | Fill #2

## 2020-06-15 NOTE — Therapy (Signed)
[facility] [address] [place], [place], [address] Phone: [phone]   Fax:  [phone]  Physical Therapy Treatment and Discharge Summary  Patient Details  Name: Hannah [name] MRN: [id number] Date of Birth: Jan 26, 1961 Referring Provider (PT): Duffy Rhody, MD   Encounter Date: 06/15/2020   PT End of Session - 06/15/20 1840    Visit Number 15    Number of Visits 21    Date for PT Re-Evaluation 06/03/20    Authorization Type [name]    PT Start Time 1325    PT Stop Time 1410    PT Time Calculation (min) 45 min    Equipment Utilized During Treatment Other (comment)   buoyancy hand bar bells, ankle buoyancy cuffs   Activity Tolerance Patient tolerated treatment well   some increase in dizziness with certain tasks   Behavior During Therapy [facility] for tasks assessed/performed           Past Medical History:  Diagnosis Date  . Cervical spondylolysis 12/15/2019   Skeleton: Mild cervical spondylosis C6-7. Incidental find.   . Left ankle sprain 10/07/2011  . Seizure ([place])    partial   . Tick bite 02/2020    Past Surgical History:  Procedure Laterality Date  . APPLICATION OF CRANIAL NAVIGATION Left 02/16/2020   Procedure: APPLICATION OF CRANIAL NAVIGATION;  Surgeon: Vallarie Mare, MD;  Location: [facility];  Service: Neurosurgery;  Laterality: Left;  . CRANIOTOMY Left 02/16/2020   Procedure: LEFT FRONTAL CRANIOTOMY FOR BRAIN TUMOR;  Surgeon: Vallarie Mare, MD;  Location: [facility];  Service: Neurosurgery;  Laterality: Left;  . FOOT SURGERY Left    bone spur - local anesthesia  . TONSILECTOMY/ADENOIDECTOMY WITH MYRINGOTOMY     tonsils and adenoids out only  . WISDOM TOOTH EXTRACTION      There were no vitals filed for this visit.   Subjective Assessment - 06/15/20 1329    Subjective Finished aquatic therapy; gave  her the courage to go out for a short run, felt like she was listing the L.  Walking consistently, still doing hiking, biking, kayaking.  Lowered Keppra, feels like speech is a little better.  Still feels dizzy if she looks up for a long period of time.    Pertinent History No signitifcant history prior to GBM s/p frontal craniotomy 4/14    Patient Stated Goals "I want to get back to running, playing tennis, hiking, biking.  I want to return to work"    Currently in Pain? 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    Change in Gait Speed Able to smoothly change walking speed without loss of balance or gait deviation. Deviate no more than 6 in outside of the 12 in walkway width.    Gait with Horizontal Head Turns Performs head turns smoothly with no change in gait. Deviates no more than 6 in outside 12 in walkway width    Gait with Vertical Head Turns Performs head turns with no change in gait. Deviates no more than 6 in outside 12 in walkway width.    Gait and Pivot Turn Pivot turns safely within  3 sec and stops quickly with no loss of balance.    Step Over Obstacle Is able to step over 2 stacked shoe boxes taped together (9 in total height) without changing gait speed. No evidence of imbalance.    Gait with Narrow Base of Support Is able to ambulate for 10 steps heel to toe with no staggering.    Gait with Eyes Closed Walks 20 ft, slow speed, abnormal gait pattern, evidence for imbalance, deviates 10-15 in outside 12 in walkway width. Requires more than 9 sec to ambulate 20 ft.   veering L, 12 seconds   Ambulating Backwards Walks 20 ft, uses assistive device, slower speed, mild gait deviations, deviates 6-10 in outside 12 in walkway width.    Steps Alternating feet, no rail.    Total Score 27    FGA comment: 27/30               Vestibular Assessment - 06/15/20 1346      Positional Sensitivities   Sit to Supine No dizziness    Supine to Left Side Lightheadedness    Supine to Right Side Mild dizziness    Supine to Sitting Mild dizziness    Nose to Right Knee No dizziness    Right Knee to Sitting No dizziness    Nose to Left Knee No dizziness    Left Knee to Sitting No dizziness    Head Turning x 5 No dizziness    Head Nodding x 5 No dizziness    Pivot Right in Standing No dizziness    Pivot Left in Standing No dizziness    Rolling Right Mild dizziness    Rolling Left Lightheadedness    Positional Sensitivities Comments reports dizziness with bed mobility is a premorbid issue                            PT Education - 06/15/20 1839    Education Details Progress towards goals, final HEP, D/C today    Person(s) Educated Patient    Methods Explanation;Demonstration;Handout    Comprehension Verbalized understanding;Returned demonstration          Gaze Stabilization: Standing Feet Apart    Walking    PERFORM WITH ONE HAND TOUCHING YOUR COUNTER TOP, PERFORM WITH EYES CLOSED.   Walk forwards slowly along counter top keeping a straight path.  At  the end, walk backwards with eyes closed to starting point keeping a straight path. Repeat __3__ times per session. Do __2__ sessions per day.   Gaze Stabilization: Standing Feet Together  Feet together, keeping eyes on target on wall __3__ feet away, tilt head down 15-30 and move head side to side for __60__ seconds. Repeat while moving head up and down for __60__ seconds. Do __2__ sessions per day   PT Short Term Goals - 04/19/20 2133      PT SHORT TERM GOAL #1   Title = LTG             PT Long Term Goals - 06/15/20 1852      PT LONG TERM GOAL #1   Title Pt will be independent with final land and aquatic HEP in order to indicate decreased fall risk and improved functional mobility.    Time 6    Period Weeks    Status Achieved      PT LONG TERM GOAL #3   Title Pt will improve FGA to >/=28/30 in order to indicate decreased fall risk.    Baseline 26/30 > 27/30    Time 6    Period Weeks    Status Partially Met      PT LONG TERM GOAL #4   Title Pt will ambulate >1000' over varying outdoor surfaces independently while scanning environment and making L and R turns without LOB in order to indicate safe community mobility.    Time 6    Period Weeks    Status Achieved      PT LONG TERM GOAL #5   Title Pt will demonstrate ability to begin jogging, hiking, [name]    Time 6    Period Weeks    Status Achieved      PT LONG TERM GOAL #7   Title Patient will report no dizziness with bed mobility, quick head and body turns, and bending down to the ground    Baseline 1/5 with bed mobility only, premorbid issue    Time 6    Period Weeks    Status Partially Met                 Plan - 06/15/20 1853    Clinical Impression Statement Performed assessment of progress towards LTG.  Pt has met 3/5 LTG and partially met other two goals.  Pt demonstrates decreased motion sensitivity overall except for  bed mobility which was a premorbid issue.  Pt is independent with HEP and will continue to work on VOR with x1 viewing and balance with vision removed.  Pt has returned to all leisure activities including hiking, jogging, kayaking, walking and tennis, has returned to work and to driving.  She also demonstrates decreased falls risk with gait but continues to demonstrate difficulty with balance when vision removed.  Pt is safe and ready for D/C today.    Personal Factors and Comorbidities Comorbidity 1;Social Background;Profession;Fitness;Transportation    Comorbidities craniotomy    Examination-Activity Limitations Caring for Others;Carry;Lift;Locomotion Level;Reach Overhead;Stairs;Stand    Examination-Participation Restrictions Community Activity;Driving;Yard Work;Meal Prep    Rehab Potential Excellent    PT Frequency 1x / week    PT Duration 6 weeks    PT Treatment/Interventions ADLs/Self Care Home Management;Aquatic Therapy;Gait training;Stair training;Functional mobility training;Therapeutic activities;Balance training;Therapeutic exercise;Neuromuscular re-education;Cognitive remediation;Patient/family education;Passive range of motion;Vestibular    Consulted and Agree with Plan of Care Patient           Patient will benefit from skilled therapeutic intervention in order to improve the following deficits and impairments:  Abnormal gait, Decreased activity tolerance, Decreased balance, Decreased  cognition, Decreased coordination, Decreased endurance, Decreased mobility, Decreased strength, Dizziness, Impaired sensation, Impaired UE functional use, Postural dysfunction  Visit Diagnosis: Other lack of coordination  Dizziness and giddiness  Muscle weakness (generalized)  Unsteadiness on feet  Other abnormalities of gait and mobility     Problem List Patient Active Problem List   Diagnosis Date Noted  . GBM (glioblastoma multiforme) ([place])   . Steroid-induced hyperglycemia   .  Seizures ([place])   . Leucocytosis   . Bradycardia   . Status post craniotomy 02/16/2020  . Tick bite 02/08/2020  . History of recent hospitalization 12/21/2019  . Seizure disorder ([place]) 12/21/2019  . Hypokalemia 12/21/2019  . Long-term use of high-risk medication 12/21/2019  . Frontal mass of brain 12/16/2019  . Family history of colon cancer - father 73 05/26/2015  . Colon cancer screening 03/10/2015  . Vitamin D deficiency 03/10/2015    PHYSICAL THERAPY DISCHARGE SUMMARY  Visits from Start of Care: 15  Current functional level related to goals / functional outcomes: See LTG achievement and impression statement above   Remaining deficits: Mild imbalance and dizziness   Education / Equipment: HEP  Plan: Patient agrees to discharge.  Patient goals were met. Patient is being discharged due to meeting the stated rehab goals.  ?????     Rico Junker, PT, DPT 06/15/20    7:00 PM    [facility] [address] [place], [place], [address] Phone: [phone]   Fax:  [phone]  Name: Hannah [name] MRN: [id number] Date of Birth: 09/12/1961

## 2020-06-15 NOTE — Patient Instructions (Addendum)
Gaze Stabilization: Standing Feet Apart    Walking    PERFORM WITH ONE HAND TOUCHING YOUR COUNTER TOP, PERFORM WITH EYES CLOSED.   Walk forwards slowly along counter top keeping a straight path.  At the end, walk backwards with eyes closed to starting point keeping a straight path. Repeat __3__ times per session. Do __2__ sessions per day.   Gaze Stabilization: Standing Feet Together    Feet together, keeping eyes on target on wall __3__ feet away, tilt head down 15-30 and move head side to side for __60__ seconds. Repeat while moving head up and down for __60__ seconds. Do __2__ sessions per day.

## 2020-06-20 ENCOUNTER — Telehealth: Payer: Self-pay | Admitting: *Deleted

## 2020-06-20 ENCOUNTER — Encounter: Payer: Self-pay | Admitting: Internal Medicine

## 2020-06-20 NOTE — Telephone Encounter (Signed)
Received call from [facility] patients insurance company to provide authorization to see Child psychotherapist at [facility].  Authorization # [id number]  Routed to MD to let [facility] know they can proceed with scheduling.  Once that is done, either patient or our office needs to call [facility] @ [phone] speak with Arville Go and provide the name of the provider and the date of the visit to finalize the authorization.  She stressed that this step of the process is important or it will be denied.

## 2020-06-23 ENCOUNTER — Encounter: Payer: Self-pay | Admitting: Internal Medicine

## 2020-06-26 ENCOUNTER — Other Ambulatory Visit: Payer: Self-pay | Admitting: Internal Medicine

## 2020-06-26 DIAGNOSIS — C719 Malignant neoplasm of brain, unspecified: Secondary | ICD-10-CM

## 2020-06-27 ENCOUNTER — Other Ambulatory Visit: Payer: Self-pay | Admitting: Radiation Therapy

## 2020-06-28 ENCOUNTER — Telehealth: Payer: Self-pay | Admitting: Internal Medicine

## 2020-06-28 NOTE — Telephone Encounter (Signed)
Release: [id number] Faxed medical records to [facility] @ fax# [phone]

## 2020-06-29 ENCOUNTER — Telehealth: Payer: Self-pay | Admitting: Internal Medicine

## 2020-06-29 NOTE — Telephone Encounter (Signed)
Released OV notes to [facility] @ [phone]    Release:  [phone]

## 2020-06-30 ENCOUNTER — Ambulatory Visit
Admission: RE | Admit: 2020-06-30 | Discharge: 2020-06-30 | Disposition: A | Discharge status: Home / Self Care | Payer: No Typology Code available for payment source | Source: Ambulatory Visit | Attending: Internal Medicine | Admitting: Internal Medicine

## 2020-06-30 ENCOUNTER — Other Ambulatory Visit: Payer: Self-pay

## 2020-06-30 DIAGNOSIS — C719 Malignant neoplasm of brain, unspecified: Secondary | ICD-10-CM

## 2020-06-30 MED ORDER — GADOBENATE DIMEGLUMINE 529 MG/ML IV SOLN
14.0000 mL | Freq: Once | INTRAVENOUS | Status: AC | PRN
  Administered 2020-06-30: 14 mL via INTRAVENOUS

## 2020-07-03 ENCOUNTER — Telehealth: Payer: Self-pay | Admitting: *Deleted

## 2020-07-03 NOTE — Telephone Encounter (Signed)
Called [facility] to give them the date that the patient is being seen by [facility] (Dr. Ruthann Cancer on 07/06/2020)  Left message for Joann to call back.  Reference # [id number]

## 2020-07-04 ENCOUNTER — Telehealth: Payer: Self-pay

## 2020-07-04 DIAGNOSIS — C719 Malignant neoplasm of brain, unspecified: Secondary | ICD-10-CM

## 2020-07-04 NOTE — Telephone Encounter (Signed)
Patient aware.

## 2020-07-04 NOTE — Telephone Encounter (Signed)
Patient needs referral to [facility] - Dr. Gaston Islam for insurance purposes.  Hannah [name] has an appointment scheduled on 07/06/20.  Thank you.

## 2020-07-04 NOTE — Telephone Encounter (Signed)
Patient needs referral to [facility] - Dr. Gaston Islam for insurance purposes. Placed for her today to neuro-onc and requested location above.

## 2020-07-04 NOTE — Telephone Encounter (Signed)
Patient needs referral to [facility] - Dr. Gaston Islam for insurance purposes.  Hannah [name] has an appointment scheduled on 07/06/20.  Thank you.

## 2020-07-05 DIAGNOSIS — C719 Malignant neoplasm of brain, unspecified: Secondary | ICD-10-CM | POA: Insufficient documentation

## 2020-07-07 ENCOUNTER — Encounter: Payer: No Typology Code available for payment source | Admitting: Physical Therapy

## 2020-07-07 ENCOUNTER — Ambulatory Visit: Payer: No Typology Code available for payment source | Attending: Internal Medicine

## 2020-07-07 DIAGNOSIS — Z23 Encounter for immunization: Secondary | ICD-10-CM

## 2020-07-07 NOTE — Progress Notes (Signed)
   Covid-19 Vaccination Clinic  Name:  Hannah [name]    MRN: [id number] DOB: 26-Feb-1961  07/07/2020  Ms. Hannah [name] was observed post Covid-19 immunization for 15 minutes without incident. She was provided with Vaccine Information Sheet and instruction to access the V-Safe system.   Ms. Hannah [name] was instructed to call 911 with any severe reactions post vaccine: Marland Kitchen Difficulty breathing  . Swelling of face and throat  . A fast heartbeat  . A bad rash all over body  . Dizziness and weakness   Immunizations Administered    Name Date Dose VIS Date Route   JANSSEN COVID-19 VACCINE 07/07/2020  1:44 PM 0.5 mL 01/01/2020 Intramuscular   Manufacturer: [employer]   Lot: [id number]   [place]: [id number]

## 2020-07-17 MED FILL — levETIRAcetam 250 MG TABS: 250 | 30 days supply | Qty: 60 | Fill #0

## 2020-07-18 ENCOUNTER — Other Ambulatory Visit: Payer: Self-pay

## 2020-07-18 ENCOUNTER — Inpatient Hospital Stay: Payer: No Typology Code available for payment source | Attending: Internal Medicine | Admitting: Internal Medicine

## 2020-07-18 VITALS — BP 136/82 | HR 57 | Temp 98.0°F | Resp 19 | Ht 65.0 in | Wt 149.4 lb

## 2020-07-18 DIAGNOSIS — Z8249 Family history of ischemic heart disease and other diseases of the circulatory system: Secondary | ICD-10-CM | POA: Diagnosis not present

## 2020-07-18 DIAGNOSIS — Z79899 Other long term (current) drug therapy: Secondary | ICD-10-CM | POA: Diagnosis not present

## 2020-07-18 DIAGNOSIS — Z87891 Personal history of nicotine dependence: Secondary | ICD-10-CM | POA: Diagnosis not present

## 2020-07-18 DIAGNOSIS — C711 Malignant neoplasm of frontal lobe: Secondary | ICD-10-CM | POA: Insufficient documentation

## 2020-07-18 DIAGNOSIS — C719 Malignant neoplasm of brain, unspecified: Secondary | ICD-10-CM | POA: Diagnosis not present

## 2020-07-18 DIAGNOSIS — R569 Unspecified convulsions: Secondary | ICD-10-CM

## 2020-07-18 DIAGNOSIS — Z833 Family history of diabetes mellitus: Secondary | ICD-10-CM | POA: Diagnosis not present

## 2020-07-18 NOTE — Progress Notes (Signed)
[facility] [place], [place] [address] [phone]   Interval Evaluation  Date of Service: 07/18/20 Patient Name: Hannah [name] Patient MRN: [id number] Patient DOB: 1961-10-30 Provider: [name], MD  Identifying Statement:  Hannah [name] is a 59 y.o. female with left frontal glioblastoma   Oncologic History: Oncology History  GBM (glioblastoma multiforme) ([facility])   Initial Diagnosis   GBM (glioblastoma multiforme) ([facility])   02/17/2020 Surgery   Craniotomy, left frontal resection by Dr. Marcello Moores.  Path is glioblastoma.     Biomarkers:  MGMT Methylated.  IDH 1/2 Wild type.  EGFR Unknown  TERT Unknown   Interval History:  Hannah [name] presents today for further discussion following recent MRI brain and [facility] consultation.  She denies new or progressive neurologic deficits, continues to experience some issues with fluency or word finding.  No recurrence of seizures, she is now driving as has been seizure free for >6 months.  No headaches or gait impairment.   H+P (01/20/20) Patient presented one month ago with sudden onset left arm and leg weakness and interrupted speech, [name], [name], stroke eval and CNS imaging which demonstrated a non-enhancing left frontal mass.  At this time she is back to baseline without recurrence of events, taking Keppra 560m twice per day.  No history or seizure or any other neurologic events.  She does describe being sleep deprived prior to seizure event.  She presents today after one month follow up MRI scan.  Medications: Current Outpatient Medications on File Prior to Visit  Medication Sig Dispense Refill  . levETIRAcetam (KEPPRA) 250 MG tablet Take 1 tablet (250 mg total) by mouth 2 (two) times daily. 60 tablet 3  . Multiple Vitamin (MULTIVITAMIN ADULT PO)  Take 1 tablet by mouth daily.  (Patient not taking: Reported on 04/17/2020)     No current facility-administered medications on file prior to visit.    Allergies: No Known Allergies Past Medical History:  Past Medical History:  Diagnosis Date  . Cervical spondylolysis 12/15/2019   Skeleton: Mild cervical spondylosis C6-7. Incidental find.   . Left ankle sprain 10/07/2011  . Seizure ([facility]    partial   . Tick bite 02/2020   Past Surgical History:  Past Surgical History:  Procedure Laterality Date  . APPLICATION OF CRANIAL NAVIGATION Left 02/16/2020   Procedure: APPLICATION OF CRANIAL NAVIGATION;  Surgeon: TVallarie Mare MD;  [facility]  Service: Neurosurgery;  Laterality: Left;  . CRANIOTOMY Left 02/16/2020   Procedure: LEFT FRONTAL CRANIOTOMY FOR BRAIN TUMOR;  Surgeon: TVallarie Mare MD;  [facility]  Service: Neurosurgery;  Laterality: Left;  . FOOT SURGERY Left    bone spur - local anesthesia  . TONSILECTOMY/ADENOIDECTOMY WITH MYRINGOTOMY     tonsils and adenoids out only  . WISDOM TOOTH EXTRACTION     Social History:  Social History   Socioeconomic History  . Marital status: Married    Spouse name: Not on file  . Number of children: 3  . Years of education: Not on file  . Highest education level: Not on file  Occupational History  . Not on file  Tobacco Use  . Smoking status: Former Smoker    Years: 2.00    Types: Cigarettes    Quit date: 11/05/1979  Years since quitting: 40.7  . Smokeless tobacco: Never Used  . Tobacco comment: 1/2 pack a week  Vaping Use  . Vaping Use: Never used  Substance and Sexual Activity  . Alcohol use: Yes    Alcohol/week: 0.0 standard drinks    Comment: 1 glass of wine weekly  . Drug use: No  . Sexual activity: Yes    Birth control/protection: Surgical  Other Topics Concern  . Not on file  Social History Narrative  . Not on file   Social Determinants of Health   Financial Resource Strain:   . Difficulty of  Paying Living Expenses: Not on file  Food Insecurity:   . Worried About Charity fundraiser in the Last Year: Not on file  . Ran Out of Food in the Last Year: Not on file  Transportation Needs:   . Lack of Transportation (Medical): Not on file  . Lack of Transportation (Non-Medical): Not on file  Physical Activity:   . Days of Exercise per Week: Not on file  . Minutes of Exercise per Session: Not on file  Stress:   . Feeling of Stress : Not on file  Social Connections:   . Frequency of Communication with Friends and Family: Not on file  . Frequency of Social Gatherings with Friends and Family: Not on file  . Attends Religious Services: Not on file  . Active Member of Clubs or Organizations: Not on file  . Attends Archivist Meetings: Not on file  . Marital Status: Not on file  Intimate Partner Violence:   . Fear of Current or Ex-Partner: Not on file  . Emotionally Abused: Not on file  . Physically Abused: Not on file  . Sexually Abused: Not on file   Family History:  Family History  Problem Relation Age of Onset  . Hypertension Mother   . Stroke Mother   . Diabetes Mother   . Cancer Father 38       colon  . Hypertension Sister   . Colon polyps Sister   . Heart disease Brother 25  . Diabetes Maternal Uncle   . Diabetes Maternal Uncle     Review of Systems: Constitutional: Doesn't report fevers, chills or abnormal weight loss Eyes: Doesn't report blurriness of vision Ears, nose, mouth, throat, and face: Doesn't report sore throat Respiratory: Doesn't report cough, dyspnea or wheezes Cardiovascular: Doesn't report palpitation, chest discomfort  Gastrointestinal:  Doesn't report nausea, constipation, diarrhea GU: Doesn't report incontinence Skin: Doesn't report skin rashes Neurological: Per HPI Musculoskeletal: Doesn't report joint pain Behavioral/Psych: Doesn't report anxiety  Physical Exam: Vitals:   07/18/20 1356  BP: 136/82  Pulse: (!) 57  Resp: 19    Temp: 98 F (36.7 C)  SpO2: 96%   KPS: 80. General: Alert, cooperative, pleasant, in no acute distress Head: Normal EENT: No conjunctival injection or scleral icterus.  Lungs: Resp effort normal Cardiac: Regular rate Abdomen: Non-distended abdomen Skin: No rashes cyanosis or petechiae. Extremities: No clubbing or edema  Neurologic Exam: Mental Status: Awake, alert, attentive to examiner. Oriented to self and environment. Language is fluent with intact comprehension.  Occasional interruptions in fluency.  Cranial Nerves: Visual acuity is grossly normal. Visual fields are full. Extra-ocular movements intact. No ptosis. Face is symmetric Motor: Tone and bulk are normal. Power is full in both arms and legs. Reflexes are symmetric, no pathologic reflexes present.  Sensory: Intact to light touch Gait: Normal.   Labs: I have reviewed the data as listed  Component Value Date/Time   NA 142 02/17/2020 0136   K 3.9 02/17/2020 0136   CL 109 02/17/2020 0136   CO2 24 02/17/2020 0136   GLUCOSE 142 (H) 02/17/2020 0136   GLUCOSE 85 10/13/2006 1158   BUN 6 02/17/2020 0136   CREATININE 0.78 02/17/2020 0136   CALCIUM 8.8 (L) 02/17/2020 0136   PROT 6.9 12/21/2019 1134   ALBUMIN 4.5 12/21/2019 1134   AST 16 12/21/2019 1134   ALT 14 12/21/2019 1134   ALKPHOS 55 12/21/2019 1134   BILITOT 0.9 12/21/2019 1134   GFRNONAA >60 02/17/2020 0136   GFRAA >60 02/17/2020 0136   Lab Results  Component Value Date   WBC 11.1 (H) 02/17/2020   NEUTROABS 10.4 (H) 02/17/2020   HGB 13.9 02/17/2020   HCT 40.6 02/17/2020   MCV 91.9 02/17/2020   PLT 225 02/17/2020    Imaging:  [facility] Clinician Interpretation: I have personally reviewed the CNS images as listed.  My interpretation, in the context of the patient's clinical presentation, is progressive disease  MR BRAIN W WO CONTRAST  Result Date: 07/01/2020 CLINICAL DATA:  Glioblastoma post resection.  No radiation. EXAM: MRI HEAD WITHOUT AND WITH  CONTRAST TECHNIQUE: Multiplanar, multiecho pulse sequences of the brain and surrounding structures were obtained without and with intravenous contrast. CONTRAST:  83m MULTIHANCE GADOBENATE DIMEGLUMINE 529 MG/ML IV SOLN COMPARISON:  MRI head 04/14/2020, 02/17/2020 FINDINGS: Brain: Left convexity craniotomy for resection of a mass over the medial convexity in the frontal lobe. Postsurgical cavity is stable. Inferior to the surgical cavity, there is a new area of faint enhancement measuring approximately 10 x 15 mm extending into the white matter. This area shows restricted diffusion. Progression of lesion in the corpus callosum extending to the right which shows restricted diffusion and a new area of enhancement. This measures approximately 10 x 15 mm. These are both consistent with tumor spread. Mild thickening and hyperintensity in the insular cortex best seen on FLAIR is unchanged. This is not enhancement may represent tumor. Ventricle size normal. No midline shift. Negative for acute infarct. Vascular: Normal arterial flow voids. Skull and upper cervical spine: Left convexity craniotomy Sinuses/Orbits: Paranasal sinuses clear.  Negative orbit Other: None IMPRESSION: Findings suggest tumor progression. New areas of enhancement in the white matter deep to the resection cavity and in the body of the corpus callosum extending to the right. Both these areas show restricted diffusion. Persistent nonenhancing hyperintensity left insula remains concerning for tumor. Electronically Signed   By: CFranchot GalloM.D.   On: 07/01/2020 16:44    Assessment/Plan GBM ([facility]iforme[facility] [C71.9]   Ben A Alvi is clinically stable today.  MRI demonstrates clear pattern of tumor progression, although less than what would be expected with typical glioblastoma.  We had an additional extensive conversation with her regarding pathology, prognosis, and available treatment pathways.    We ultimately recommended  proceeding with course of intensity modulated radiation therapy and concurrent daily Temozolomide.  Radiation will be administered Mon-Fri over 6 weeks, Temodar will be dosed at 747mm2 to be given daily over 42 days.  We reviewed side effects of temodar, including fatigue, nausea/vomiting, constipation, and cytopenias.  She is agreeable with this plan.  Chemotherapy should be held for the following:  ANC less than 1,000  Platelets less than 100,000  LFT or creatinine greater than 2x ULN  If clinical concerns/contraindications develop  Every 2 weeks during radiation, labs will be checked accompanied by a clinical evaluation in the brain tumor  clinic.   She will reach out to [place] via phone or mychart with her decision regarding proceeding with radiation and/or chemotherapy.  Keppra should continue 284m BID.  All questions were answered. The patient knows to call the clinic with any problems, questions or concerns. No barriers to learning were detected.  The total time spent in the encounter was 40 minutes and more than 50% was on counseling and review of test results   ZVentura Sellers MD Medical Director of Neuro-Oncology [facility]09/14/21 4:09 PM

## 2020-07-19 ENCOUNTER — Ambulatory Visit
Admission: RE | Admit: 2020-07-19 | Discharge: 2020-07-19 | Disposition: A | Discharge status: Home / Self Care | Payer: No Typology Code available for payment source | Source: Ambulatory Visit | Attending: Radiation Oncology | Admitting: Radiation Oncology

## 2020-07-19 ENCOUNTER — Other Ambulatory Visit: Payer: Self-pay | Admitting: Radiation Therapy

## 2020-07-19 VITALS — Ht 65.0 in | Wt 149.0 lb

## 2020-07-19 DIAGNOSIS — C719 Malignant neoplasm of brain, unspecified: Secondary | ICD-10-CM

## 2020-07-19 DIAGNOSIS — C711 Malignant neoplasm of frontal lobe: Secondary | ICD-10-CM | POA: Insufficient documentation

## 2020-07-19 NOTE — Progress Notes (Signed)
Location/Histology of Brain Tumor: Glioblastoma Frontal  She went for consultation at [facility].  Patient presented with symptoms of:  Initial presentation was in March 2021.  She came in with sudden onset left arm and leg weakness and interrupted speech, and seizure.  MRI Brain 06/30/2020: Left convexity craniotomy for resection of a mass over the medial convexity in the frontal lobe. Postsurgical cavity is stable. Inferior to the surgical cavity, there is a new area of faint enhancement measuring approximately 10 x 15 mm extending into the white matter.   MRI Brain 04/14/2020: Stable to minimally increased nonenhancing T2 hyperintensity along the anterosuperior margin of the left frontal resection cavity for which a small amount of residual tumor is not excluded.  Unchanged subtle T2 hyperintensity and expansion of the left insula suspicious for nonenhancing tumor. 3. Suggestion of a new 3 mm focus of faint enhancement along the body of the corpus callosum on the right with mild T2 hyperintensity extending into the right cingulate gyrus.  Past or anticipated interventions, if any, per neurosurgery:  Dr. Marcello Moores -Left frontal craniotomy for GBM resection 02/16/2020   Past or anticipated interventions, if any, per medical oncology:  Dr. Mickeal Skinner 07/18/2020 - Katherene A Santa is clinically stable today.  MRI demonstrates clear pattern of tumor progression, although less than what would be expected with typical glioblastoma. -  We ultimately recommended proceeding with course of intensity modulated radiation therapy and concurrent daily Temozolomide.  Radiation will be administered Mon-Fri over 6 weeks, Temodar will be dosed at 75mg /m2 to be given daily over 42 days.  Dose of Decadron, if applicable: none  Recent neurologic symptoms, if any:   Seizures: No  Headaches: No  Nausea: No  Dizziness/ataxia: no  Difficulty with hand coordination: Harder to write.  She states she writes slower, "  labor intensive".  Fine motor movements intact.   Focal numbness/weakness: No  Visual deficits/changes: No  Confusion/Memory deficits:   Speech: She is having some issues with getting the right word out.   SAFETY ISSUES:  Prior radiation? No  Pacemaker/ICD? No  Possible current pregnancy? Postmenopausal  Is the patient on methotrexate? No  Additional Complaints / other details:

## 2020-07-20 ENCOUNTER — Telehealth: Payer: Self-pay | Admitting: Internal Medicine

## 2020-07-20 ENCOUNTER — Ambulatory Visit: Payer: No Typology Code available for payment source | Admitting: Radiation Oncology

## 2020-07-20 ENCOUNTER — Other Ambulatory Visit: Payer: Self-pay | Admitting: *Deleted

## 2020-07-20 ENCOUNTER — Other Ambulatory Visit: Payer: Self-pay

## 2020-07-20 ENCOUNTER — Institutional Professional Consult (permissible substitution): Payer: No Typology Code available for payment source | Admitting: Radiation Oncology

## 2020-07-20 NOTE — Telephone Encounter (Signed)
No 9/14 los

## 2020-07-20 NOTE — Progress Notes (Signed)
Radiation Oncology         [phone]) [phone] ________________________________  Name: [name]        MRN: [id number]  Date of Service: 07/19/2020 DOB: [birth year]  NI:OEVOJJ, Renee A, DO  Vaslow, Acey Lav, MD     REFERRING PHYSICIAN: [name], MD   DIAGNOSIS: The encounter diagnosis was Glioblastoma multiforme of frontal lobe ([place]).   HISTORY OF PRESENT ILLNESS: Hannah [name] is a 59 y.o. female seen at the request of Dr. Mickeal Skinner for a history of glioblastoma.  The patient was originally noted to have presentation of left arm and weakness on the left side of her leg as well as interrupted speech and a seizure in February 2021.  Further investigation revealed a 3 cm lesion in the left posterior frontal vertex affecting the brain anterior to the precentral gyrus and within that mass there was a 2 cm region with more mass-effect and a 1 cm region of indistinct enhancement.  This was felt to be a possible low-grade glioma, the patient continued to be symptomatic which prompted further MRI scan on 01/18/2020 measuring the area with the main component of 2.6 cm in diameter, and contrast enhancement being more extensive now measuring up to 10 x 13 x 6 mm compared to 7 x 9 x 5 mm.  Given these findings it was suspected that she may have a higher grade glioma, and she subsequently underwent surgical resection on 02/16/2020 with a left frontal craniotomy.  Final pathology confirmed glioblastoma.  She has met with Dr. Mickeal Skinner, and she also second opinion, and has been counseled on treatment with chemoradiation.  Given her good performance status and desire to try and avoid and delay treatment, she has been followed without adjuvant therapy.  Recent imaging on 06/30/2020 showed suggestion of tumor progression with new areas of enhancement in the white matter deep to the resection cavity and in the body of the corpus callosum extending into the right side.  Given these findings, Dr. Mickeal Skinner has met with  her to discuss the options of now beginning treatment.  She is interested in moving forward and is seen today via MyChart to discuss options of radiotherapy.     PREVIOUS RADIATION THERAPY: No   PAST MEDICAL HISTORY:  Past Medical History:  Diagnosis Date  . Cervical spondylolysis 12/15/2019   Skeleton: Mild cervical spondylosis C6-7. Incidental find.   . Left ankle sprain 10/07/2011  . Seizure ([place])    partial   . Tick bite 02/2020       PAST SURGICAL HISTORY: Past Surgical History:  Procedure Laterality Date  . APPLICATION OF CRANIAL NAVIGATION Left 02/16/2020   Procedure: APPLICATION OF CRANIAL NAVIGATION;  Surgeon: Vallarie Mare, MD;  Location: [place];  Service: Neurosurgery;  Laterality: Left;  . CRANIOTOMY Left 02/16/2020   Procedure: LEFT FRONTAL CRANIOTOMY FOR BRAIN TUMOR;  Surgeon: Vallarie Mare, MD;  Location: [place];  Service: Neurosurgery;  Laterality: Left;  . FOOT SURGERY Left    bone spur - local anesthesia  . TONSILECTOMY/ADENOIDECTOMY WITH MYRINGOTOMY     tonsils and adenoids out only  . WISDOM TOOTH EXTRACTION       FAMILY HISTORY:  Family History  Problem Relation Age of Onset  . Hypertension Mother   . Stroke Mother   . Diabetes Mother   . Cancer Father 53       colon  . Hypertension Sister   . Colon polyps Sister   . Heart disease Brother  52  . Diabetes Maternal Uncle   . Diabetes Maternal Uncle      SOCIAL HISTORY:  reports that she quit smoking about 40 years ago. Her smoking use included cigarettes. She quit after 2.00 years of use. She has never used smokeless tobacco. She reports current alcohol use. She reports that she does not use drugs.  The patient is married and lives in [place].  She works part-time in Haematologist at [employer].  She has an adult daughter named [name] who participates in the encounter today.   ALLERGIES: Patient has no known allergies.   MEDICATIONS:  Current Outpatient Medications    Medication Sig Dispense Refill  . levETIRAcetam (KEPPRA) 250 MG tablet Take 1 tablet (250 mg total) by mouth 2 (two) times daily. 60 tablet 3  . Multiple Vitamin (MULTIVITAMIN ADULT PO) Take 1 tablet by mouth daily.  (Patient not taking: Reported on 04/17/2020)     No current facility-administered medications for this encounter.     REVIEW OF SYSTEMS: On review of systems, the patient reports that she is doing well overall.  Her main side effects from her tumor are that she has had a significant change in her handwriting such that it is difficult to legibly read it as well as fatiguing of her hand when writing.  She is right-handed.  She states that she still been able to enjoy playing tennis but is unable to serve due to changes in motor function and timing.  She is not experiencing any headaches, recent seizure activity, nausea or dizziness or difficulty with focal weakness.  She does find that she has a difficult time getting the correct word out when she is speaking.  She discusses that while she and her husband are both vaccinated, he did test positive for Covid today, and is feeling poorly.  No other complaints are verbalized.     PHYSICAL EXAM:  Wt Readings from Last 3 Encounters:  07/19/20 149 lb (67.6 kg)  07/18/20 149 lb 6.4 oz (67.8 kg)  04/17/20 157 lb 1.6 oz (71.3 kg)   Pain Assessment Pain Score: 0-No pain/10  In general this is a well appearing Caucasian female in no acute distress.  She's alert and oriented x4 and appropriate throughout the examination. Cardiopulmonary assessment is negative for acute distress and she exhibits normal effort.     ECOG = 1  0 - Asymptomatic (Fully active, able to carry on all predisease activities without restriction)  1 - Symptomatic but completely ambulatory (Restricted in physically strenuous activity but ambulatory and able to carry out work of a light or sedentary nature. For example, light housework, office work)  2 - Symptomatic,  <50% in bed during the day (Ambulatory and capable of all self care but unable to carry out any work activities. Up and about more than 50% of waking hours)  3 - Symptomatic, >50% in bed, but not bedbound (Capable of only limited self-care, confined to bed or chair 50% or more of waking hours)  4 - Bedbound (Completely disabled. Cannot carry on any self-care. Totally confined to bed or chair)  5 - Death   Eustace Pen MM, Creech RH, Tormey DC, et al. [phone]). "Toxicity and response criteria of the [facility]". [place] Oncol. 5 (6): 649-55    LABORATORY DATA:  Lab Results  Component Value Date   WBC 11.1 (H) 02/17/2020   HGB 13.9 02/17/2020   HCT 40.6 02/17/2020   MCV 91.9 02/17/2020  PLT 225 02/17/2020   Lab Results  Component Value Date   NA 142 02/17/2020   K 3.9 02/17/2020   CL 109 02/17/2020   CO2 24 02/17/2020   Lab Results  Component Value Date   ALT 14 12/21/2019   AST 16 12/21/2019   ALKPHOS 55 12/21/2019   BILITOT 0.9 12/21/2019      RADIOGRAPHY: MR BRAIN W WO CONTRAST  Result Date: 07/01/2020 CLINICAL DATA:  Glioblastoma post resection.  No radiation. EXAM: MRI HEAD WITHOUT AND WITH CONTRAST TECHNIQUE: Multiplanar, multiecho pulse sequences of the brain and surrounding structures were obtained without and with intravenous contrast. CONTRAST:  71m MULTIHANCE GADOBENATE DIMEGLUMINE 529 MG/ML IV SOLN COMPARISON:  MRI head 04/14/2020, 02/17/2020 FINDINGS: Brain: Left convexity craniotomy for resection of a mass over the medial convexity in the frontal lobe. Postsurgical cavity is stable. Inferior to the surgical cavity, there is a new area of faint enhancement measuring approximately 10 x 15 mm extending into the white matter. This area shows restricted diffusion. Progression of lesion in the corpus callosum extending to the right which shows restricted diffusion and a new area of enhancement. This measures approximately 10 x 15 mm. These are both  consistent with tumor spread. Mild thickening and hyperintensity in the insular cortex best seen on FLAIR is unchanged. This is not enhancement may represent tumor. Ventricle size normal. No midline shift. Negative for acute infarct. Vascular: Normal arterial flow voids. Skull and upper cervical spine: Left convexity craniotomy Sinuses/Orbits: Paranasal sinuses clear.  Negative orbit Other: None IMPRESSION: Findings suggest tumor progression. New areas of enhancement in the white matter deep to the resection cavity and in the body of the corpus callosum extending to the right. Both these areas show restricted diffusion. Persistent nonenhancing hyperintensity left insula remains concerning for tumor. Electronically Signed   By: CFranchot GalloM.D.   On: 07/01/2020 16:44       IMPRESSION/PLAN: 1. Glioblastoma.  Dr. MLisbeth Renshawdiscusses the patient's recent imaging as well as her course.  We were aware of her case as she has been discussed in several brain oncology conference this.  Dr. MLisbeth Renshawreviews the rationale for chemoradiation and describes the risks, benefits, short and long-term effects of radiotherapy as well as the delivery and logistics of treatment.  He would recommend proceeding sooner rather than later.  The patient does have an upcoming trip that is a vacation she he and his family have been looking forward to for quite some time and is not willing to delay this in order to proceed with treatment and request that we delay treatment until she is returns home.  Given the timing between when she would begin treatment in October and her last MRI scan, Dr. MLisbeth Renshawdiscusses that he would recommend a repeat MRI scan.  We will plan for this to occur the week of October 11, since she will be out of town October 15 through the 21st.  While this is certainly not ideal, she understands the risks of waiting more than a month to proceed.  She is however realistic about quality of life and her vacation is a very  important part of that.  She will present on 08/15/2020 for simulation as well.  At that time she will signed written consent to proceed. 2. Covid exposure.  While the patient is vaccinated, according to [facility] she should be tested 3 to 5 days after encountering the person who tested positive. Since her exposure was her husband whom she lives  with, and since he became symptomatic over the weekend, we recommended 3 to 5 days from his symptoms she be tested.  She was given suggestions on locations where she could proceed.  She is in agreement with this plan and will let us know the results.  This encounter was provided by telemedicine platform MyChart.  The patient has provided two factor identification and has given verbal consent for this type of encounter and has been advised to only accept a meeting of this type in a secure network environment. The time spent during this encounter was 60 minutes including preparation, discussion, and coordination of the patient's care. The attendants for this meeting include Blenda Nicely, RN, Dr. Lisbeth Renshaw, Hayden Pedro  and Cristopher Estimable and her daughter Ayisha Pol.  During the encounter,  Blenda Nicely, RN, Dr. Lisbeth Renshaw, and Hayden Pedro were located at [facility] Radiation Oncology Department.  Fatima A Shrum was located at home with her daughter Amatullah Christy.    The above documentation reflects my direct findings during this shared patient visit. Please see the separate note by Dr. Lisbeth Renshaw on this date for the remainder of the patient's plan of care.    Carola Rhine, PAC

## 2020-07-24 ENCOUNTER — Encounter: Payer: Self-pay | Admitting: Internal Medicine

## 2020-08-01 ENCOUNTER — Encounter: Payer: No Typology Code available for payment source | Admitting: Family Medicine

## 2020-08-02 ENCOUNTER — Other Ambulatory Visit: Payer: Self-pay

## 2020-08-02 ENCOUNTER — Encounter: Payer: Self-pay | Admitting: Radiation Oncology

## 2020-08-02 ENCOUNTER — Ambulatory Visit (INDEPENDENT_AMBULATORY_CARE_PROVIDER_SITE_OTHER): Payer: No Typology Code available for payment source | Admitting: Family Medicine

## 2020-08-02 ENCOUNTER — Encounter: Payer: Self-pay | Admitting: Family Medicine

## 2020-08-02 VITALS — BP 136/85 | HR 54 | Temp 98.4°F | Ht 64.0 in | Wt 145.0 lb

## 2020-08-02 DIAGNOSIS — Z131 Encounter for screening for diabetes mellitus: Secondary | ICD-10-CM | POA: Diagnosis not present

## 2020-08-02 DIAGNOSIS — Z1159 Encounter for screening for other viral diseases: Secondary | ICD-10-CM | POA: Diagnosis not present

## 2020-08-02 DIAGNOSIS — G40909 Epilepsy, unspecified, not intractable, without status epilepticus: Secondary | ICD-10-CM

## 2020-08-02 DIAGNOSIS — C719 Malignant neoplasm of brain, unspecified: Secondary | ICD-10-CM

## 2020-08-02 DIAGNOSIS — Z13220 Encounter for screening for lipoid disorders: Secondary | ICD-10-CM

## 2020-08-02 DIAGNOSIS — Z1211 Encounter for screening for malignant neoplasm of colon: Secondary | ICD-10-CM

## 2020-08-02 DIAGNOSIS — Z800 Family history of malignant neoplasm of digestive organs: Secondary | ICD-10-CM

## 2020-08-02 NOTE — Progress Notes (Signed)
This visit occurred during the SARS-CoV-2 public health emergency.  Safety protocols were in place, including screening questions prior to the visit, additional usage of staff PPE, and extensive cleaning of exam room while observing appropriate contact time as indicated for disinfecting solutions.    Patient ID: Hannah [name], female  DOB: [birth year], 59 y.o.   MRN: [id number] Patient Care Team    Relationship Specialty Notifications Start End  [name], DO PCP - General Family Medicine  04/29/16   Pa, Bryan  Neurosurgery  02/08/20     Chief Complaint  Patient presents with  . Annual Exam    pt is fasting     Subjective:  Hannah [name] is a 59 y.o.  Female  present for CPE. All past medical history, surgical history, allergies, family history, immunizations, medications and social history were UPDATED in the electronic medical record today. All recent labs, ED visits and hospitalizations within the last year were reviewed.  Health maintenance:  Colonoscopy: Family history of colon cancer in father at 82.  Patient has a history of one colon polyp.  Completed 05/2015, by Dr. Carlean Purl,  follow up 65yr follow.>  Referred to GI Mammogram: Patient declines breast cancer screenings. Cervical cancer screening: last pap: 2019, r, completed by: Gynecology Immunizations: tdap UTD 2018, Influenza UTD 2020-we will have influenza updated at work 2021 (encouraged yearly), J&J vaccine completed 07/07/2020.  Patient will discuss Shingrix series with her oncologist.  She is to start chemotherapy soon for her brain tumor and wants to ensure safety. Infectious disease screening: HIV completed 2021.  Hep C completed today DEXA: Screen start at age 28-65 and last completed at gynecology. Assistive device: None Oxygen use: None Patient has a Dental home. Hospitalizations/ED visits: Reviewed    Depression screen [facility] 2/9 08/02/2020 12/21/2019 04/29/2016  Decreased  Interest 0 0 0  Down, Depressed, Hopeless 0 0 0  PHQ - 2 Score 0 0 0   No flowsheet data found.    Immunization History  Administered Date(s) Administered  . Influenza Split 07/21/2012  . Influenza Whole 08/05/2011  . Influenza,inj,Quad PF,6+ Mos 08/03/2014  . Influenza-Unspecified 08/04/2016, 08/04/2017, 07/30/2018  . Janssen (J&J) SARS-COV-2 Vaccination 07/07/2020  . Tdap 10/04/2009, 11/12/2016     Past Medical History:  Diagnosis Date  . Cervical spondylolysis 12/15/2019   Skeleton: Mild cervical spondylosis C6-7. Incidental find.   . Colon polyp   . Left ankle sprain 10/07/2011  . Seizure ([place])    partial   . Tick bite 02/2020   No Known Allergies Past Surgical History:  Procedure Laterality Date  . APPLICATION OF CRANIAL NAVIGATION Left 02/16/2020   Procedure: APPLICATION OF CRANIAL NAVIGATION;  Surgeon: Vallarie Mare, MD;  Location: [place];  Service: Neurosurgery;  Laterality: Left;  . CRANIOTOMY Left 02/16/2020   Procedure: LEFT FRONTAL CRANIOTOMY FOR BRAIN TUMOR;  Surgeon: Vallarie Mare, MD;  Location: [place];  Service: Neurosurgery;  Laterality: Left;  . FOOT SURGERY Left    bone spur - local anesthesia  . TONSILECTOMY/ADENOIDECTOMY WITH MYRINGOTOMY     tonsils and adenoids out only  . WISDOM TOOTH EXTRACTION     Family History  Problem Relation Age of Onset  . Hypertension Mother   . Stroke Mother   . Diabetes Mother   . Colon cancer Father 59  . Hypertension Sister   . Colon polyps Sister   . Heart disease Brother 21  . Diabetes Maternal Uncle   .  Diabetes Maternal Uncle    Social History   Social History Narrative  . Not on file    Allergies as of 08/02/2020   No Known Allergies     Medication List       Accurate as of August 02, 2020 11:59 PM. If you have any questions, ask your nurse or doctor.        STOP taking these medications   MULTIVITAMIN ADULT PO Stopped by: Howard Pouch, DO     TAKE these medications   HERBAL  EXPEC PO Take by mouth.   levETIRAcetam 250 MG tablet Commonly known as: KEPPRA Take 1 tablet (250 mg total) by mouth 2 (two) times daily.   MULTIVITAL PO Take 1 tablet by mouth daily.       All past medical history, surgical history, allergies, family history, immunizations andmedications were updated in the EMR today and reviewed under the history and medication portions of their EMR.     Recent Results (from the past 2160 hour(s))  Hepatitis C antibody     Status: None   Collection Time: 08/02/20  2:14 PM  Result Value Ref Range   Hepatitis C Ab NON-REACTIVE NON-REACTI   SIGNAL TO CUT-OFF 0.01 <1.00    Comment: . HCV antibody was non-reactive. There is no laboratory  evidence of HCV infection. . In most cases, no further action is required. However, if recent HCV exposure is suspected, a test for HCV RNA (test code 330 [id number]) is suggested. . For additional information please refer to http://education.questdiagnostics.com/faq/FAQ22v1 (This link is being provided for informational/ educational purposes only.) .     No results found.   ROS: 14 pt review of systems performed and negative (unless mentioned in an HPI)  Objective: BP 136/85   Pulse (!) 54   Temp 98.4 F (36.9 C) (Oral)   Ht 5\' 4"  (1.626 m)   Wt 145 lb (65.8 kg)   LMP 06/30/2015   SpO2 99%   BMI 24.89 kg/m  Gen: Afebrile. No acute distress. Nontoxic in appearance, well-developed, well-nourished, pleasant female. HENT: AT. [place]. Bilateral TM visualized and normal in appearance, normal external auditory canal. MMM, no oral lesions, adequate dentition. Bilateral nares within normal limits. Throat without erythema, ulcerations or exudates.  No cough on exam, no hoarseness on exam. Eyes:Pupils Equal Round Reactive to light, Extraocular movements intact,  Conjunctiva without redness, discharge or icterus. Neck/lymp/endocrine: Supple, no lymphadenopathy, no thyromegaly CV: RRR no murmur, no edema, +2/4 P  posterior tibialis pulses.  Chest: CTAB, no wheeze, rhonchi or crackles.  Normal respiratory effort.  Good air movement. Abd: Soft.  Flat. NTND. BS present.  No masses palpated. No hepatosplenomegaly. No rebound tenderness or guarding. Skin: No rashes, purpura or petechiae. Warm and well-perfused. Skin intact. Neuro/Msk:  Normal gait. PERLA. EOMi. Alert. Oriented x3.  Cranial nerves II through XII intact. Muscle strength 5/5 upper/lower extremity. DTRs equal bilaterally. Psych: Normal affect, dress and demeanor. Normal speech. Normal thought content and judgment.   No exam data present  Assessment/plan: Hannah [name] is a 59 y.o. female present for  CPE    Patient was encouraged to exercise greater than 150 minutes a week. Patient was encouraged to choose a diet filled with fresh fruits and vegetables, and lean meats. AVS provided to patient today for education/recommendation on gender specific health and safety maintenance. Colonoscopy: Family history of colon cancer in father at 56.  Patient has a history of one colon polyp.  Completed 05/2015, by Dr. Carlean Purl,  follow up 38yr follow.>  Referred to GI Mammogram: Patient declines breast cancer screenings. Cervical cancer screening: last pap: 2019, r, completed by: Gynecology Immunizations: tdap UTD 2018, Influenza UTD 2020-we will have influenza updated at work 2021 (encouraged yearly), J&J vaccine completed 07/07/2020.  Patient will discuss Shingrix series with her oncologist.  She is to start chemotherapy soon for her brain tumor and wants to ensure safety. Infectious disease screening: HIV completed 2021.  Hep C completed today DEXA: Screen start at age 3-65 and last completed at gynecology. Return in about 1 year (around 08/02/2021) for CPE (30 min).   Orders Placed This Encounter  Procedures  . Hepatitis C antibody  . CBC with Differential/Platelet  . Comprehensive metabolic panel  . TSH  . Lipid panel  . Hemoglobin A1c  .  Ambulatory referral to Gastroenterology   No orders of the defined types were placed in this encounter.   Referral Orders     Ambulatory referral to Gastroenterology   Electronically signed by: Howard Pouch, DO [place]

## 2020-08-02 NOTE — Patient Instructions (Signed)
Health Maintenance, Female Adopting a healthy lifestyle and getting preventive care are important in promoting health and wellness. Ask your health care provider about:  The right schedule for you to have regular tests and exams.  Things you can do on your own to prevent diseases and keep yourself healthy. What should I know about diet, weight, and exercise? Eat a healthy diet   Eat a diet that includes plenty of vegetables, fruits, low-fat dairy products, and lean protein.  Do not eat a lot of foods that are high in solid fats, added sugars, or sodium. Maintain a healthy weight Body mass index (BMI) is used to identify weight problems. It estimates body fat based on height and weight. Your health care provider can help determine your BMI and help you achieve or maintain a healthy weight. Get regular exercise Get regular exercise. This is one of the most important things you can do for your health. Most adults should:  Exercise for at least 150 minutes each week. The exercise should increase your heart rate and make you sweat (moderate-intensity exercise).  Do strengthening exercises at least twice a week. This is in addition to the moderate-intensity exercise.  Spend less time sitting. Even light physical activity can be beneficial. Watch cholesterol and blood lipids Have your blood tested for lipids and cholesterol at 59 years of age, then have this test every 5 years. Have your cholesterol levels checked more often if:  Your lipid or cholesterol levels are high.  You are older than 59 years of age.  You are at high risk for heart disease. What should I know about cancer screening? Depending on your health history and family history, you may need to have cancer screening at various ages. This may include screening for:  Breast cancer.  Cervical cancer.  Colorectal cancer.  Skin cancer.  Lung cancer. What should I know about heart disease, diabetes, and high blood  pressure? Blood pressure and heart disease  High blood pressure causes heart disease and increases the risk of stroke. This is more likely to develop in people who have high blood pressure readings, are of African descent, or are overweight.  Have your blood pressure checked: ? Every 3-5 years if you are 18-39 years of age. ? Every year if you are 40 years old or older. Diabetes Have regular diabetes screenings. This checks your fasting blood sugar level. Have the screening done:  Once every three years after age 40 if you are at a normal weight and have a low risk for diabetes.  More often and at a younger age if you are overweight or have a high risk for diabetes. What should I know about preventing infection? Hepatitis B If you have a higher risk for hepatitis B, you should be screened for this virus. Talk with your health care provider to find out if you are at risk for hepatitis B infection. Hepatitis C Testing is recommended for:  Everyone born from 1945 through 1965.  Anyone with known risk factors for hepatitis C. Sexually transmitted infections (STIs)  Get screened for STIs, including gonorrhea and chlamydia, if: ? You are sexually active and are younger than 59 years of age. ? You are older than 59 years of age and your health care provider tells you that you are at risk for this type of infection. ? Your sexual activity has changed since you were last screened, and you are at increased risk for chlamydia or gonorrhea. Ask your health care provider if   you are at risk.  Ask your health care provider about whether you are at high risk for HIV. Your health care provider may recommend a prescription medicine to help prevent HIV infection. If you choose to take medicine to prevent HIV, you should first get tested for HIV. You should then be tested every 3 months for as long as you are taking the medicine. Pregnancy  If you are about to stop having your period (premenopausal) and  you may become pregnant, seek counseling before you get pregnant.  Take 400 to 800 micrograms (mcg) of folic acid every day if you become pregnant.  Ask for birth control (contraception) if you want to prevent pregnancy. Osteoporosis and menopause Osteoporosis is a disease in which the bones lose minerals and strength with aging. This can result in bone fractures. If you are 65 years old or older, or if you are at risk for osteoporosis and fractures, ask your health care provider if you should:  Be screened for bone loss.  Take a calcium or vitamin D supplement to lower your risk of fractures.  Be given hormone replacement therapy (HRT) to treat symptoms of menopause. Follow these instructions at home: Lifestyle  Do not use any products that contain nicotine or tobacco, such as cigarettes, e-cigarettes, and chewing tobacco. If you need help quitting, ask your health care provider.  Do not use street drugs.  Do not share needles.  Ask your health care provider for help if you need support or information about quitting drugs. Alcohol use  Do not drink alcohol if: ? Your health care provider tells you not to drink. ? You are pregnant, may be pregnant, or are planning to become pregnant.  If you drink alcohol: ? Limit how much you use to 0-1 drink a day. ? Limit intake if you are breastfeeding.  Be aware of how much alcohol is in your drink. In the [place] one drink equals one 12 oz bottle of beer (355 mL), one 5 oz glass of wine (148 mL), or one 1 oz glass of hard liquor (44 mL). General instructions  Schedule regular health, dental, and eye exams.  Stay current with your vaccines.  Tell your health care provider if: ? You often feel depressed. ? You have ever been abused or do not feel safe at home. Summary  Adopting a healthy lifestyle and getting preventive care are important in promoting health and wellness.  Follow your health care provider's instructions about healthy  diet, exercising, and getting tested or screened for diseases.  Follow your health care provider's instructions on monitoring your cholesterol and blood pressure. This information is not intended to replace advice given to you by your health care provider. Make sure you discuss any questions you have with your health care provider. Document Revised: 10/14/2018 Document Reviewed: 10/14/2018 Elsevier Patient Education  2020 [employer]vier Inc.  

## 2020-08-03 LAB — HEPATITIS C ANTIBODY
Hepatitis C Ab: NONREACTIVE
SIGNAL TO CUT-OFF: 0.01 (ref <1.00)

## 2020-08-04 LAB — COMPREHENSIVE METABOLIC PANEL
ALT: 12 U/L (ref 0–35)
AST: 17 U/L (ref 0–37)
Albumin: 4.7 g/dL (ref 3.5–5.2)
Alkaline Phosphatase: 48 U/L (ref 39–117)
BUN: 11 mg/dL (ref 6–23)
CO2: 29 mEq/L (ref 19–32)
Calcium: 9.7 mg/dL (ref 8.4–10.5)
Chloride: 102 mEq/L (ref 96–112)
Creatinine, Ser: 0.79 mg/dL (ref 0.40–1.20)
GFR: 74.35 mL/min (ref >60.00)
Glucose, Bld: 80 mg/dL (ref 70–99)
Potassium: 4 mEq/L (ref 3.5–5.1)
Sodium: 140 mEq/L (ref 135–145)
Total Bilirubin: 0.9 mg/dL (ref 0.2–1.2)
Total Protein: 7 g/dL (ref 6.0–8.3)

## 2020-08-04 LAB — CBC WITH DIFFERENTIAL/PLATELET
Basophils Absolute: 0 10*3/uL (ref 0.0–0.1)
Basophils Relative: 0.8 % (ref 0.0–3.0)
Eosinophils Absolute: 0.1 10*3/uL (ref 0.0–0.7)
Eosinophils Relative: 1.8 % (ref 0.0–5.0)
HCT: 44.5 % (ref 36.0–46.0)
Hemoglobin: 14.6 g/dL (ref 12.0–15.0)
Lymphocytes Relative: 33.9 % (ref 12.0–46.0)
Lymphs Abs: 1.3 10*3/uL (ref 0.7–4.0)
MCHC: 32.9 g/dL (ref 30.0–36.0)
MCV: 94.6 fl (ref 78.0–100.0)
Monocytes Absolute: 0.4 10*3/uL (ref 0.1–1.0)
Monocytes Relative: 10 % (ref 3.0–12.0)
Neutro Abs: 2 10*3/uL (ref 1.4–7.7)
Neutrophils Relative %: 53.5 % (ref 43.0–77.0)
Platelets: 172 10*3/uL (ref 150.0–400.0)
RBC: 4.7 Mil/uL (ref 3.87–5.11)
RDW: 13.3 % (ref 11.5–15.5)
WBC: 3.8 10*3/uL — ABNORMAL LOW (ref 4.0–10.5)

## 2020-08-04 LAB — HEMOGLOBIN A1C: Hgb A1c MFr Bld: 5.6 % (ref 4.6–6.5)

## 2020-08-04 LAB — LIPID PANEL
Cholesterol: 235 mg/dL — ABNORMAL HIGH (ref 0–200)
HDL: 76.3 mg/dL (ref >39.00)
LDL Cholesterol: 145 mg/dL — ABNORMAL HIGH (ref 0–99)
NonHDL: 158.93
Total CHOL/HDL Ratio: 3
Triglycerides: 72 mg/dL (ref 0.0–149.0)
VLDL: 14.4 mg/dL (ref 0.0–40.0)

## 2020-08-04 LAB — TSH: TSH: 1.2 u[IU]/mL (ref 0.35–4.50)

## 2020-08-09 ENCOUNTER — Other Ambulatory Visit: Payer: Self-pay | Admitting: Internal Medicine

## 2020-08-14 ENCOUNTER — Ambulatory Visit
Admission: RE | Admit: 2020-08-14 | Discharge: 2020-08-14 | Disposition: A | Discharge status: Home / Self Care | Payer: No Typology Code available for payment source | Source: Ambulatory Visit | Attending: Radiation Oncology | Admitting: Radiation Oncology

## 2020-08-14 ENCOUNTER — Other Ambulatory Visit: Payer: Self-pay

## 2020-08-14 DIAGNOSIS — C719 Malignant neoplasm of brain, unspecified: Secondary | ICD-10-CM

## 2020-08-14 MED ORDER — GADOBENATE DIMEGLUMINE 529 MG/ML IV SOLN
13.0000 mL | Freq: Once | INTRAVENOUS | Status: AC | PRN
  Administered 2020-08-14: 13 mL via INTRAVENOUS

## 2020-08-15 ENCOUNTER — Ambulatory Visit
Admission: RE | Admit: 2020-08-15 | Discharge: 2020-08-15 | Disposition: A | Discharge status: Home / Self Care | Payer: No Typology Code available for payment source | Source: Ambulatory Visit | Attending: Radiation Oncology | Admitting: Radiation Oncology

## 2020-08-15 ENCOUNTER — Other Ambulatory Visit: Payer: Self-pay | Admitting: Radiation Oncology

## 2020-08-15 ENCOUNTER — Telehealth: Payer: Self-pay | Admitting: Pharmacist

## 2020-08-15 ENCOUNTER — Other Ambulatory Visit: Payer: Self-pay | Admitting: Internal Medicine

## 2020-08-15 ENCOUNTER — Telehealth: Payer: Self-pay

## 2020-08-15 VITALS — BP 155/85 | HR 59 | Temp 97.6°F | Resp 18 | Wt 142.2 lb

## 2020-08-15 DIAGNOSIS — Z510 Encounter for antineoplastic radiation therapy: Secondary | ICD-10-CM | POA: Diagnosis present

## 2020-08-15 DIAGNOSIS — C719 Malignant neoplasm of brain, unspecified: Secondary | ICD-10-CM

## 2020-08-15 DIAGNOSIS — C711 Malignant neoplasm of frontal lobe: Secondary | ICD-10-CM | POA: Insufficient documentation

## 2020-08-15 MED ORDER — LEVETIRACETAM 250 MG PO TABS: 250.0000 mg | ORAL_TABLET | Freq: Two times a day (BID) | ORAL | 1 refills | Status: DC

## 2020-08-15 MED ORDER — ONDANSETRON HCL 8 MG PO TABS: 8.0000 mg | ORAL_TABLET | Freq: Two times a day (BID) | ORAL | 1 refills | Status: DC | PRN

## 2020-08-15 MED ORDER — TEMOZOLOMIDE 20 MG PO CAPS: 20.0000 mg | ORAL_CAPSULE | Freq: Every day | ORAL | 0 refills | Status: DC

## 2020-08-15 MED ORDER — TEMOZOLOMIDE 100 MG PO CAPS: 100.0000 mg | ORAL_CAPSULE | Freq: Every day | ORAL | 0 refills | Status: DC

## 2020-08-15 MED ORDER — SODIUM CHLORIDE 0.9% FLUSH: 10.0000 mL | Freq: Once | INTRAVENOUS | Status: DC

## 2020-08-15 MED ORDER — DEXAMETHASONE 4 MG PO TABS: 4.0000 mg | ORAL_TABLET | Freq: Every day | ORAL | 1 refills | Status: DC

## 2020-08-15 MED FILL — levETIRAcetam 250 MG TABS: 250 | 30 days supply | Qty: 60 | Fill #0

## 2020-08-15 MED FILL — ONDANSETRON HCL 8 MG TABLET: 8 | 15 days supply | Qty: 30 | Fill #0

## 2020-08-15 MED FILL — DEXAMETHASONE 4 MG TABLET: 4 | 30 days supply | Qty: 30 | Fill #0

## 2020-08-15 NOTE — Progress Notes (Signed)
Has armband been applied?  yes  Does patient have an allergy to IV contrast dye?: No   Has patient ever received premedication for IV contrast dye?:  n/a  Does patient take metformin?: No  If patient does take metformin when was the last dose: n/a  Date of lab work: 08/02/2020 BUN: 11 CR: 0.79 eGFR: >60  IV site: right AC Has IV site been added to flowsheet?  Yes Vitals:   08/15/20 1329  BP: (!) 155/85  Pulse: (!) 59  Resp: 18  Temp: 97.6 F (36.4 C)  TempSrc: Oral  SpO2: 100%  Weight: 64.5 kg    Patient states that her blood pressure is elevated due to anxiety. Denies any s/s of hypertension. LMP 06/30/2015

## 2020-08-15 NOTE — Telephone Encounter (Signed)
Oral Oncology Patient Advocate Encounter  Prior Authorization for Temodar has been approved for 12 fills  PA# 20mg  BDUQFBUL         100mg  [id number]   Effective dates: 08/15/20 through 08/14/21  Patients co-pay is 20mg  $33.64                               100mg  $190.73  Oral Oncology Clinic will continue to follow.   [place] Patient [place] Phone [phone] Fax [phone] 08/15/2020 3:38 PM

## 2020-08-15 NOTE — Progress Notes (Signed)
I spoke with the patient to let her know the results of her MRI which shows significant progression of her GBM. She still has plans to travel this Friday and be back in town on 08/24/20 to start treatment on 08/28/20. Dr. Mickeal Skinner is also informed after I examined her and feel that she has decreased sensory perception in the right leg from the ankle to the knee, and subtle changes in strength from the right knee down. We discussed the possibility of using dexamethasone and he is in agreement with this plan and I'll refill her Keppra at her request as well. She has not been taking her second dose of this each day, more so on the days she feels poorly. I recommended that she take this as prescribed and confirmed that we do not need to increase to 500 mg after speaking with Dr. Mickeal Skinner.

## 2020-08-15 NOTE — Telephone Encounter (Signed)
Oral Oncology Pharmacist Encounter  Received new prescription for Temodar (temozolomide) for the treatment of glioblastoma multiforme in conjunction with radiation, planned duration 42 days.  Prescription dose and frequency assessed for appropriateness. Appropriate for therapy initiation.   CMP and CBC w/ Diff from 08/02/20 assessed, labs OK for treatment initiation.  Current medication list in Epic reviewed, no significant/relevant DDIs with Temodar identified.  Evaluated chart and no patient barriers to medication adherence noted.   Prescription has been e-scribed to the [facility] for benefits analysis and approval.  Oral Oncology Clinic will continue to follow for insurance authorization, copayment issues, initial counseling and start date.  Leron Croak, PharmD, BCPS Hematology/Oncology Clinical Pharmacist [facility] [phone] 08/15/2020 2:27 PM

## 2020-08-15 NOTE — Progress Notes (Signed)
START ON PATHWAY REGIMEN - Neuro     One cycle, concurrent with RT:     Temozolomide   **Always confirm dose/schedule in your pharmacy ordering system**  Patient Characteristics: Glioblastoma (Grade IV Glioma), Newly Diagnosed / Treatment Naive, Good Performance Status and/or Younger Patient, MGMT Promoter Methylated Disease Classification: Glioma Disease Classification: Glioblastoma (Grade IV Glioma) Disease Status: Newly Diagnosed / Treatment Naive Performance Status: Good Performance Status and/or Younger Patient MGMT Promoter Methylation Status: Methylated Intent of Therapy: Non-Curative / Palliative Intent, Discussed with Patient

## 2020-08-15 NOTE — Telephone Encounter (Signed)
Oral Oncology Patient Advocate Encounter  Received notification from [facility] that prior authorization for Temodar is required.  PA submitted on CoverMyMeds Key 20mg  BDUQFBUL        100mg  [id number]  Status is pending  Oral Oncology Clinic will continue to follow.   [place] Patient [place] Phone [phone] Fax [phone] 08/15/2020 3:21 PM

## 2020-08-16 ENCOUNTER — Encounter: Payer: Self-pay | Admitting: Internal Medicine

## 2020-08-18 ENCOUNTER — Telehealth: Payer: Self-pay | Admitting: Internal Medicine

## 2020-08-18 NOTE — Telephone Encounter (Signed)
Scheduled appt per 10/14 sch msg - called pt , no answer. Left message for patient with appt date and time

## 2020-08-18 NOTE — Telephone Encounter (Signed)
Oral Chemotherapy Pharmacist Encounter   Attempted to reach patient to provide update and offer for initial counseling on oral medication: Temodar (temozolomide).  No answer.  Left voicemail for patient to call back to discuss details of medication acquisition and initial counseling session.  Leron Croak, PharmD, BCPS Hematology/Oncology Clinical Pharmacist [facility] [phone] 08/18/2020 10:17 AM

## 2020-08-18 NOTE — Telephone Encounter (Signed)
Oral Chemotherapy Pharmacist Encounter  I spoke with patient for overview of: Temodar (temozolomide) for the treatment of glioblastoma multiforme in conjunction with radiation, planned duration concomitant phase 42 days of therapy.   Counseled patient on administration, dosing, side effects, monitoring, drug-food interactions, safe handling, storage, and disposal.  Patient will take Temodar 100mg  capsules and Temodar 20mg  capsules, 120 mg total daily dose, by mouth once daily, may take at bedtime and on an empty stomach to decrease nausea and vomiting.  Patient will take Temodar concurrent with radiation for 42 days straight.  Temodar start date: 08/27/20 PM Radiation start date: 08/28/20   Patient will take Zofran 8mg  tablet, 1 tablet by mouth 30-60 min prior to Temodar dose to help decrease N/V once starting adjuvant therapy. Prophylactic Zofran will not be used at initiation of concurrent phase, but will be initiated if nausea develops despite Temodar administration on an empty stomach and at bedtime.   Adverse effects include but are not limited to: nausea, vomiting, anorexia, GI upset, rash, drug fever, and fatigue. Rare but serious adverse effects of pneumocystis pneumonia and secondary malignancy also discussed.  PCP prophylaxis will not be initiated at this time, but may be added based on lymphocyte count in the future.  Reviewed with patient importance of keeping a medication schedule and plan for any missed doses. No barriers to medication adherence identified.  Medication reconciliation performed and medication/allergy list updated.  Insurance authorization for Temodar has been obtained. Test claim at the pharmacy revealed copayment $224.37 for 1st fill of Temodar. Delivery to patient's home for 08/25/20 will be set up with the [facility].   Patient informed the pharmacy will reach out 5-7 days prior to needing next fill of Temodar to coordinate continued  medication acquisition to prevent break in therapy.   All questions answered.  Ms. Moffat voiced understanding and appreciation.   Medication education handout and medication calendar placed in mail for patient. Patient knows to call the office with questions or concerns. Oral Chemotherapy Clinic phone number provided to patient.   Leron Croak, PharmD, BCPS Hematology/Oncology Clinical Pharmacist [facility] [phone] 08/18/2020 4:28 PM

## 2020-08-24 MED FILL — TEMOZOLOMIDE 20 MG CAPS: 20 | 28 days supply | Qty: 28 | Fill #0

## 2020-08-24 MED FILL — TEMOZOLOMIDE 100 MG CAPS: 100 | 28 days supply | Qty: 28 | Fill #0

## 2020-08-25 ENCOUNTER — Encounter: Payer: Self-pay | Admitting: Internal Medicine

## 2020-08-25 ENCOUNTER — Encounter: Payer: Self-pay | Admitting: *Deleted

## 2020-08-25 DIAGNOSIS — Z510 Encounter for antineoplastic radiation therapy: Secondary | ICD-10-CM | POA: Diagnosis not present

## 2020-08-28 ENCOUNTER — Other Ambulatory Visit: Payer: Self-pay

## 2020-08-28 ENCOUNTER — Ambulatory Visit
Admission: RE | Admit: 2020-08-28 | Discharge: 2020-08-28 | Disposition: A | Discharge status: Home / Self Care | Payer: No Typology Code available for payment source | Source: Ambulatory Visit | Attending: Radiation Oncology | Admitting: Radiation Oncology

## 2020-08-28 VITALS — BP 137/101 | HR 64

## 2020-08-28 DIAGNOSIS — Z510 Encounter for antineoplastic radiation therapy: Secondary | ICD-10-CM | POA: Diagnosis not present

## 2020-08-28 DIAGNOSIS — C719 Malignant neoplasm of brain, unspecified: Secondary | ICD-10-CM

## 2020-08-28 MED ORDER — LORAZEPAM 1 MG PO TABS
0.5000 mg | ORAL_TABLET | Freq: Once | ORAL | Status: AC
  Administered 2020-08-28: 0.5 mg via ORAL
  Filled 2020-08-28: qty 0.5

## 2020-08-28 NOTE — Progress Notes (Signed)
Called over to the treatment machine.  Patient is having a tough time receiving her first radiation treatment.  Patient states she is having a ton of anxiety about it.  She was given 0.5 mg Ativan per MD order.  She was advised that this medication would make her sleepy/drowsy.  Husband is with her and will drive her home.  Vitals taken, BP elevated.  She asked for something for upcoming treatments.  She was informed that I would ask the MD to send in a prescription for her.  She was advised to always have someone drive her to treatments and to take the medication 15-20 minutes prior to treatment.   Gloriajean Dell. Leonie Green, BSN

## 2020-08-29 ENCOUNTER — Other Ambulatory Visit: Payer: Self-pay | Admitting: Radiation Oncology

## 2020-08-29 ENCOUNTER — Ambulatory Visit
Admission: RE | Admit: 2020-08-29 | Discharge: 2020-08-29 | Disposition: A | Discharge status: Home / Self Care | Payer: No Typology Code available for payment source | Source: Ambulatory Visit | Attending: Radiation Oncology | Admitting: Radiation Oncology

## 2020-08-29 DIAGNOSIS — Z510 Encounter for antineoplastic radiation therapy: Secondary | ICD-10-CM | POA: Diagnosis not present

## 2020-08-29 MED ORDER — LORAZEPAM 0.5 MG PO TABS: 0.5000 mg | ORAL_TABLET | Freq: Every day | ORAL | 0 refills | Status: DC

## 2020-08-29 MED ORDER — LORAZEPAM 0.5 MG PO TABS
0.5000 mg | ORAL_TABLET | Freq: Every day | ORAL | 0 refills | Status: DC
Start: 2020-08-29 — End: 2020-09-21

## 2020-08-29 MED FILL — LORazepam 0.5 MG TABS: 0.5 | 30 days supply | Qty: 30 | Fill #0

## 2020-08-30 ENCOUNTER — Ambulatory Visit
Admission: RE | Admit: 2020-08-30 | Discharge: 2020-08-30 | Disposition: A | Discharge status: Home / Self Care | Payer: No Typology Code available for payment source | Source: Ambulatory Visit | Attending: Radiation Oncology | Admitting: Radiation Oncology

## 2020-08-30 ENCOUNTER — Encounter: Payer: Self-pay | Admitting: Internal Medicine

## 2020-08-30 DIAGNOSIS — Z510 Encounter for antineoplastic radiation therapy: Secondary | ICD-10-CM | POA: Diagnosis not present

## 2020-08-31 ENCOUNTER — Ambulatory Visit
Admission: RE | Admit: 2020-08-31 | Discharge: 2020-08-31 | Disposition: A | Discharge status: Home / Self Care | Payer: No Typology Code available for payment source | Source: Ambulatory Visit | Attending: Radiation Oncology | Admitting: Radiation Oncology

## 2020-08-31 ENCOUNTER — Other Ambulatory Visit: Payer: Self-pay

## 2020-08-31 DIAGNOSIS — Z510 Encounter for antineoplastic radiation therapy: Secondary | ICD-10-CM | POA: Diagnosis not present

## 2020-08-31 NOTE — Progress Notes (Signed)
Pt here for patient teaching.  Pt given Radiation and You booklet, skin care instructions and Sonafine.  Reviewed areas of pertinence such as fatigue, hair loss, nausea and vomiting, skin changes, headache and blurry vision . Pt able to give teach back of to pat skin and use unscented/gentle soap,apply Sonafine bid and avoid applying anything to skin within 4 hours of treatment. Pt verbalizes understanding of information given and will contact nursing with any questions or concerns.    Keshia Weare M. Jahsir Rama RN, BSN      

## 2020-09-01 ENCOUNTER — Ambulatory Visit
Admission: RE | Admit: 2020-09-01 | Discharge: 2020-09-01 | Disposition: A | Discharge status: Home / Self Care | Payer: No Typology Code available for payment source | Source: Ambulatory Visit | Attending: Radiation Oncology | Admitting: Radiation Oncology

## 2020-09-01 ENCOUNTER — Other Ambulatory Visit: Payer: Self-pay

## 2020-09-01 DIAGNOSIS — Z510 Encounter for antineoplastic radiation therapy: Secondary | ICD-10-CM | POA: Diagnosis not present

## 2020-09-01 DIAGNOSIS — C719 Malignant neoplasm of brain, unspecified: Secondary | ICD-10-CM

## 2020-09-01 MED ORDER — SONAFINE EX EMUL
1.0000 "application " | Freq: Once | CUTANEOUS | Status: AC
  Administered 2020-09-01: 1 via TOPICAL

## 2020-09-04 ENCOUNTER — Other Ambulatory Visit: Payer: Self-pay

## 2020-09-04 ENCOUNTER — Ambulatory Visit
Admission: RE | Admit: 2020-09-04 | Discharge: 2020-09-04 | Disposition: A | Discharge status: Home / Self Care | Payer: No Typology Code available for payment source | Source: Ambulatory Visit | Attending: Radiation Oncology | Admitting: Radiation Oncology

## 2020-09-04 DIAGNOSIS — R109 Unspecified abdominal pain: Secondary | ICD-10-CM | POA: Diagnosis not present

## 2020-09-04 DIAGNOSIS — Z9221 Personal history of antineoplastic chemotherapy: Secondary | ICD-10-CM | POA: Diagnosis not present

## 2020-09-04 DIAGNOSIS — Z833 Family history of diabetes mellitus: Secondary | ICD-10-CM | POA: Diagnosis not present

## 2020-09-04 DIAGNOSIS — Z8249 Family history of ischemic heart disease and other diseases of the circulatory system: Secondary | ICD-10-CM | POA: Diagnosis not present

## 2020-09-04 DIAGNOSIS — C711 Malignant neoplasm of frontal lobe: Secondary | ICD-10-CM | POA: Insufficient documentation

## 2020-09-04 DIAGNOSIS — Z7952 Long term (current) use of systemic steroids: Secondary | ICD-10-CM | POA: Diagnosis not present

## 2020-09-04 DIAGNOSIS — K5909 Other constipation: Secondary | ICD-10-CM | POA: Diagnosis not present

## 2020-09-04 DIAGNOSIS — Z510 Encounter for antineoplastic radiation therapy: Secondary | ICD-10-CM | POA: Insufficient documentation

## 2020-09-04 DIAGNOSIS — R112 Nausea with vomiting, unspecified: Secondary | ICD-10-CM | POA: Diagnosis not present

## 2020-09-04 DIAGNOSIS — Z87891 Personal history of nicotine dependence: Secondary | ICD-10-CM | POA: Diagnosis not present

## 2020-09-04 DIAGNOSIS — Z800 Family history of malignant neoplasm of digestive organs: Secondary | ICD-10-CM | POA: Diagnosis not present

## 2020-09-04 DIAGNOSIS — Z79899 Other long term (current) drug therapy: Secondary | ICD-10-CM | POA: Diagnosis not present

## 2020-09-04 DIAGNOSIS — Z923 Personal history of irradiation: Secondary | ICD-10-CM | POA: Diagnosis not present

## 2020-09-05 ENCOUNTER — Other Ambulatory Visit: Payer: Self-pay

## 2020-09-05 ENCOUNTER — Ambulatory Visit
Admission: RE | Admit: 2020-09-05 | Discharge: 2020-09-05 | Disposition: A | Discharge status: Home / Self Care | Payer: No Typology Code available for payment source | Source: Ambulatory Visit | Attending: Radiation Oncology | Admitting: Radiation Oncology

## 2020-09-05 DIAGNOSIS — C711 Malignant neoplasm of frontal lobe: Secondary | ICD-10-CM | POA: Diagnosis not present

## 2020-09-06 ENCOUNTER — Other Ambulatory Visit: Payer: Self-pay

## 2020-09-06 ENCOUNTER — Ambulatory Visit
Admission: RE | Admit: 2020-09-06 | Discharge: 2020-09-06 | Disposition: A | Discharge status: Home / Self Care | Payer: No Typology Code available for payment source | Source: Ambulatory Visit | Attending: Radiation Oncology | Admitting: Radiation Oncology

## 2020-09-06 DIAGNOSIS — C711 Malignant neoplasm of frontal lobe: Secondary | ICD-10-CM | POA: Diagnosis not present

## 2020-09-07 ENCOUNTER — Other Ambulatory Visit: Payer: Self-pay | Admitting: *Deleted

## 2020-09-07 ENCOUNTER — Other Ambulatory Visit: Payer: Self-pay

## 2020-09-07 ENCOUNTER — Inpatient Hospital Stay: Payer: No Typology Code available for payment source | Attending: Internal Medicine | Admitting: Internal Medicine

## 2020-09-07 ENCOUNTER — Ambulatory Visit
Admission: RE | Admit: 2020-09-07 | Discharge: 2020-09-07 | Disposition: A | Discharge status: Home / Self Care | Payer: No Typology Code available for payment source | Source: Ambulatory Visit | Attending: Radiation Oncology | Admitting: Radiation Oncology

## 2020-09-07 ENCOUNTER — Inpatient Hospital Stay: Payer: No Typology Code available for payment source

## 2020-09-07 VITALS — BP 125/86 | HR 50 | Temp 97.9°F | Resp 18 | Ht 64.0 in | Wt 137.1 lb

## 2020-09-07 DIAGNOSIS — C711 Malignant neoplasm of frontal lobe: Secondary | ICD-10-CM | POA: Insufficient documentation

## 2020-09-07 DIAGNOSIS — R569 Unspecified convulsions: Secondary | ICD-10-CM

## 2020-09-07 DIAGNOSIS — R32 Unspecified urinary incontinence: Secondary | ICD-10-CM

## 2020-09-07 DIAGNOSIS — R112 Nausea with vomiting, unspecified: Secondary | ICD-10-CM | POA: Insufficient documentation

## 2020-09-07 DIAGNOSIS — Z87891 Personal history of nicotine dependence: Secondary | ICD-10-CM | POA: Insufficient documentation

## 2020-09-07 DIAGNOSIS — C719 Malignant neoplasm of brain, unspecified: Secondary | ICD-10-CM

## 2020-09-07 DIAGNOSIS — Z833 Family history of diabetes mellitus: Secondary | ICD-10-CM | POA: Insufficient documentation

## 2020-09-07 DIAGNOSIS — R109 Unspecified abdominal pain: Secondary | ICD-10-CM | POA: Insufficient documentation

## 2020-09-07 DIAGNOSIS — Z8249 Family history of ischemic heart disease and other diseases of the circulatory system: Secondary | ICD-10-CM | POA: Insufficient documentation

## 2020-09-07 DIAGNOSIS — Z79899 Other long term (current) drug therapy: Secondary | ICD-10-CM | POA: Insufficient documentation

## 2020-09-07 DIAGNOSIS — Z7952 Long term (current) use of systemic steroids: Secondary | ICD-10-CM | POA: Insufficient documentation

## 2020-09-07 DIAGNOSIS — Z923 Personal history of irradiation: Secondary | ICD-10-CM | POA: Insufficient documentation

## 2020-09-07 DIAGNOSIS — Z800 Family history of malignant neoplasm of digestive organs: Secondary | ICD-10-CM | POA: Insufficient documentation

## 2020-09-07 DIAGNOSIS — K5909 Other constipation: Secondary | ICD-10-CM | POA: Insufficient documentation

## 2020-09-07 DIAGNOSIS — Z9221 Personal history of antineoplastic chemotherapy: Secondary | ICD-10-CM | POA: Insufficient documentation

## 2020-09-07 LAB — CBC WITH DIFFERENTIAL (CANCER CENTER ONLY)
Abs Immature Granulocytes: 0.02 10*3/uL (ref 0.00–0.07)
Basophils Absolute: 0 10*3/uL (ref 0.0–0.1)
Basophils Relative: 0 %
Eosinophils Absolute: 0 10*3/uL (ref 0.0–0.5)
Eosinophils Relative: 0 %
HCT: 44.8 % (ref 36.0–46.0)
Hemoglobin: 15.2 g/dL — ABNORMAL HIGH (ref 12.0–15.0)
Immature Granulocytes: 0 %
Lymphocytes Relative: 5 %
Lymphs Abs: 0.3 10*3/uL — ABNORMAL LOW (ref 0.7–4.0)
MCH: 30.8 pg (ref 26.0–34.0)
MCHC: 33.9 g/dL (ref 30.0–36.0)
MCV: 90.9 fL (ref 80.0–100.0)
Monocytes Absolute: 0.2 10*3/uL (ref 0.1–1.0)
Monocytes Relative: 2 %
Neutro Abs: 6.6 10*3/uL (ref 1.7–7.7)
Neutrophils Relative %: 93 %
Platelet Count: 259 10*3/uL (ref 150–400)
RBC: 4.93 MIL/uL (ref 3.87–5.11)
RDW: 12 % (ref 11.5–15.5)
WBC Count: 7.1 10*3/uL (ref 4.0–10.5)
nRBC: 0 % (ref 0.0–0.2)

## 2020-09-07 LAB — CMP (CANCER CENTER ONLY)
ALT: 26 U/L (ref 0–44)
AST: 20 U/L (ref 15–41)
Albumin: 4.2 g/dL (ref 3.5–5.0)
Alkaline Phosphatase: 46 U/L (ref 38–126)
Anion gap: 8 (ref 5–15)
BUN: 17 mg/dL (ref 6–20)
CO2: 28 mmol/L (ref 22–32)
Calcium: 9.3 mg/dL (ref 8.9–10.3)
Chloride: 103 mmol/L (ref 98–111)
Creatinine: 0.9 mg/dL (ref 0.44–1.00)
GFR, Estimated: >60 mL/min (ref >60)
Glucose, Bld: 126 mg/dL — ABNORMAL HIGH (ref 70–99)
Potassium: 4 mmol/L (ref 3.5–5.1)
Sodium: 139 mmol/L (ref 135–145)
Total Bilirubin: 0.6 mg/dL (ref 0.3–1.2)
Total Protein: 7 g/dL (ref 6.5–8.1)

## 2020-09-07 LAB — URINALYSIS, COMPLETE (UACMP) WITH MICROSCOPIC
Bacteria, UA: NONE SEEN
Bilirubin Urine: NEGATIVE
Glucose, UA: NEGATIVE mg/dL
Hgb urine dipstick: NEGATIVE
Ketones, ur: NEGATIVE mg/dL
Leukocytes,Ua: NEGATIVE
Nitrite: NEGATIVE
Protein, ur: NEGATIVE mg/dL
Specific Gravity, Urine: 1.004 — ABNORMAL LOW (ref 1.005–1.030)
pH: 6 (ref 5.0–8.0)

## 2020-09-07 MED ORDER — LEVETIRACETAM 500 MG PO TABS
500.0000 mg | ORAL_TABLET | Freq: Two times a day (BID) | ORAL | 3 refills | Status: DC
Start: 2020-09-07 — End: 2021-01-08

## 2020-09-07 MED ORDER — DEXAMETHASONE 4 MG PO TABS: 4.0000 mg | ORAL_TABLET | Freq: Every day | ORAL | 1 refills | Status: DC

## 2020-09-07 MED FILL — levETIRAcetam 500 MG TABS: 500 | 30 days supply | Qty: 60 | Fill #0

## 2020-09-07 NOTE — Progress Notes (Signed)
[facility] at [place], [place] [address] [phone]   Interval Evaluation  Date of Service: 09/07/20 Patient Name: Hannah [name] Patient MRN: [id number] Patient DOB: May 16, 1961 Provider: [name], MD  Identifying Statement:  Hannah [name] is a 59 y.o. female with left frontal glioblastoma   Oncologic History: Oncology History  GBM (glioblastoma multiforme) ([place])   Initial Diagnosis   GBM (glioblastoma multiforme) ([place])   02/17/2020 Surgery   Craniotomy, left frontal resection by Dr. Marcello Moores.  Path is glioblastoma.   08/28/2020 -  Chemotherapy   The patient had temozolomide (TEMODAR) 20 MG capsule, 20 mg (100 % of original dose 20 mg), Oral, Daily, 0 of 1 cycle, Start date: 08/15/2020, End date: -- Dose modification: 20 mg (original dose 20 mg, Cycle 1) temozolomide (TEMODAR) 100 MG capsule, 100 mg (100 % of original dose 100 mg), Oral, Daily, 0 of 1 cycle, Start date: 08/15/2020, End date: -- Dose modification: 100 mg (original dose 100 mg, Cycle 1)  for chemotherapy treatment.      Biomarkers:  MGMT Methylated.  IDH 1/2 Wild type.  EGFR Unknown  TERT Unknown   Interval History:  Hannah [name] presents today after having completed 2 weeks of IMRT and Temodar.  She does describe some worsening of right sided weakness, speech impairment, balance and overall cognitive clarity.  She did experience at least one stereotypical seizure as well.  Dr. Lisbeth Renshaw increased her decadron to 38m twice per day, and this has helped considerably.  She is currently more or less at her baseline, independent with gait and all ADLs.  She does still have some trouble with fine motor control with her left hand, limiting ability to tie shoes and button shirt.  No issues with Temodar.  H+P (01/20/20) Patient presented one month ago with sudden onset left arm and leg weakness and interrupted speech, c/w seizure.  She was playing tennis  at the time, [name], stroke eval and CNS imaging which demonstrated a non-enhancing left frontal mass.  At this time she is back to baseline without recurrence of events, taking Keppra 5049mtwice per day.  No history or seizure or any other neurologic events.  She does describe being sleep deprived prior to seizure event.  She presents today after one month follow up MRI scan.  Medications: Current Outpatient Medications on File Prior to Visit  Medication Sig Dispense Refill  . Multiple Vitamins-Minerals (MULTIVITAL PO) Take 1 tablet by mouth daily.    . ondansetron (ZOFRAN) 8 MG tablet Take 1 tablet (8 mg total) by mouth 2 (two) times daily as needed (nausea and vomiting). May take 30-60 minutes prior to Temodar administration if nausea/vomiting occurs. 30 tablet 1  . temozolomide (TEMODAR) 100 MG capsule Take 1 capsule (100 mg total) by mouth daily. May take on an empty stomach to decrease nausea & vomiting. 42 capsule 0  . temozolomide (TEMODAR) 20 MG capsule Take 1 capsule (20 mg total) by mouth daily. May take on an empty stomach to decrease nausea & vomiting. 42 capsule 0  . guaiFENesin (HERBAL EXPEC PO) Take by mouth.    . Marland KitchenORazepam (ATIVAN) 0.5 MG tablet Take 1 tablet (0.5 mg total) by mouth daily. Take 30 min prior to treatment. (Patient not taking: Reported on 09/07/2020) 30 tablet 0   No current facility-administered medications on file prior to  visit.    Allergies: No Known Allergies Past Medical History:  Past Medical History:  Diagnosis Date  . Cervical spondylolysis 12/15/2019   Skeleton: Mild cervical spondylosis C6-7. Incidental find.   . Colon polyp   . Left ankle sprain 10/07/2011  . Seizure ([place])    partial   . Tick bite 02/2020   Past Surgical History:  Past Surgical History:  Procedure Laterality Date  . APPLICATION OF CRANIAL NAVIGATION Left 02/16/2020   Procedure: APPLICATION OF  CRANIAL NAVIGATION;  Surgeon: Vallarie Mare, MD;  Location: [facility];  Service: Neurosurgery;  Laterality: Left;  . CRANIOTOMY Left 02/16/2020   Procedure: LEFT FRONTAL CRANIOTOMY FOR BRAIN TUMOR;  Surgeon: Vallarie Mare, MD;  Location: [facility];  Service: Neurosurgery;  Laterality: Left;  . FOOT SURGERY Left    bone spur - local anesthesia  . TONSILECTOMY/ADENOIDECTOMY WITH MYRINGOTOMY     tonsils and adenoids out only  . WISDOM TOOTH EXTRACTION     Social History:  Social History   Socioeconomic History  . Marital status: Married    Spouse name: Not on file  . Number of children: 3  . Years of education: Not on file  . Highest education level: Not on file  Occupational History  . Not on file  Tobacco Use  . Smoking status: Former Smoker    Years: 2.00    Types: Cigarettes    Quit date: 11/05/1979    Years since quitting: 40.8  . Smokeless tobacco: Never Used  . Tobacco comment: 1/2 pack a week  Vaping Use  . Vaping Use: Never used  Substance and Sexual Activity  . Alcohol use: Yes    Alcohol/week: 0.0 standard drinks    Comment: 1 glass of wine weekly  . Drug use: No  . Sexual activity: Yes    Birth control/protection: Surgical  Other Topics Concern  . Not on file  Social History Narrative  . Not on file   Social Determinants of Health   Financial Resource Strain:   . Difficulty of Paying Living Expenses: Not on file  Food Insecurity:   . Worried About Charity fundraiser in the Last Year: Not on file  . Ran Out of Food in the Last Year: Not on file  Transportation Needs:   . Lack of Transportation (Medical): Not on file  . Lack of Transportation (Non-Medical): Not on file  Physical Activity:   . Days of Exercise per Week: Not on file  . Minutes of Exercise per Session: Not on file  Stress:   . Feeling of Stress : Not on file  Social Connections:   . Frequency of Communication with Friends and Family: Not on file  . Frequency of Social Gatherings with  Friends and Family: Not on file  . Attends Religious Services: Not on file  . Active Member of Clubs or Organizations: Not on file  . Attends Archivist Meetings: Not on file  . Marital Status: Not on file  Intimate Partner Violence:   . Fear of Current or Ex-Partner: Not on file  . Emotionally Abused: Not on file  . Physically Abused: Not on file  . Sexually Abused: Not on file   Family History:  Family History  Problem Relation Age of Onset  . Hypertension Mother   . Stroke Mother   . Diabetes Mother   . Colon cancer Father 72  . Hypertension Sister   . Colon polyps Sister   . Heart disease Brother  75  . Diabetes Maternal Uncle   . Diabetes Maternal Uncle     Review of Systems: Constitutional: Doesn't report fevers, chills or abnormal weight loss Eyes: Doesn't report blurriness of vision Ears, nose, mouth, throat, and face: Doesn't report sore throat Respiratory: Doesn't report cough, dyspnea or wheezes Cardiovascular: Doesn't report palpitation, chest discomfort  Gastrointestinal:  Doesn't report nausea, constipation, diarrhea GU: Doesn't report incontinence Skin: Doesn't report skin rashes Neurological: Per HPI Musculoskeletal: Doesn't report joint pain Behavioral/Psych: Doesn't report anxiety  Physical Exam: Vitals:   09/07/20 1428  BP: 125/86  Pulse: (!) 50  Resp: 18  Temp: 97.9 F (36.6 C)  SpO2: 100%   KPS: 80. General: Alert, cooperative, pleasant, in no acute distress Head: Normal EENT: No conjunctival injection or scleral icterus.  Lungs: Resp effort normal Cardiac: Regular rate Abdomen: Non-distended abdomen Skin: No rashes cyanosis or petechiae. Extremities: No clubbing or edema  Neurologic Exam: Mental Status: Awake, alert, attentive to examiner. Oriented to self and environment. Language is fluent with intact comprehension.  Occasional interruptions in fluency.  Cranial Nerves: Visual acuity is grossly normal. Visual fields are  full. Extra-ocular movements intact. No ptosis. Face is symmetric Motor: Tone and bulk are normal. Power is full in both arms and legs. Reflexes are symmetric, no pathologic reflexes present.  Sensory: Intact to light touch Gait: Normal.   Labs: I have reviewed the data as listed    Component Value Date/Time   NA 139 09/07/2020 1415   K 4.0 09/07/2020 1415   CL 103 09/07/2020 1415   CO2 28 09/07/2020 1415   GLUCOSE 126 (H) 09/07/2020 1415   GLUCOSE 85 10/13/2006 1158   BUN 17 09/07/2020 1415   CREATININE 0.90 09/07/2020 1415   CALCIUM 9.3 09/07/2020 1415   PROT 7.0 09/07/2020 1415   ALBUMIN 4.2 09/07/2020 1415   AST 20 09/07/2020 1415   ALT 26 09/07/2020 1415   ALKPHOS 46 09/07/2020 1415   BILITOT 0.6 09/07/2020 1415   GFRNONAA >60 09/07/2020 1415   GFRAA >60 02/17/2020 0136   Lab Results  Component Value Date   WBC 7.1 09/07/2020   NEUTROABS 6.6 09/07/2020   HGB 15.2 (H) 09/07/2020   HCT 44.8 09/07/2020   MCV 90.9 09/07/2020   PLT 259 09/07/2020     Assessment/Plan GBM (glioblastoma multiforme) ([facility]) [C71.9]   Hannah [name] is clinically stable today after having initiated dexamethasone 61m daily for clinical decline.  She has now completed two weeks of IMRT and Temodar.  We ultimately recommended continuing with course of intensity modulated radiation therapy and concurrent daily Temozolomide.  Radiation will be administered Mon-Fri over 6 weeks, Temodar will be dosed at 731mm2 to be given daily over 42 days.    Chemotherapy should be held for the following:  ANC less than 1,000  Platelets less than 100,000  LFT or creatinine greater than 2x ULN  If clinical concerns/contraindications develop  We recommended increasing Keppra to 50042mID.  Decadron she will decrease to 6mg74mily in the next few days, when she feels ready.  She may decrease further to 4mg 48mly if maintaining her baseline.   Every 2 weeks during radiation, labs will be checked  accompanied by a clinical evaluation in the brain tumor clinic.   All questions were answered. The patient knows to call the clinic with any problems, questions or concerns. No barriers to learning were detected.  The total time spent in the encounter was 40 minutes and more than 50% was  on counseling and review of test results   [name], MD Medical Director of Neuro-Oncology [facility] [place] 09/07/20 5:06 PM

## 2020-09-07 NOTE — Progress Notes (Signed)
Notified patient of results of urinalysis.  Advised that she should follow up with PCP in regards to vaginal bleeding post menopausal since her urinalysis was normal.

## 2020-09-08 ENCOUNTER — Ambulatory Visit
Admission: RE | Admit: 2020-09-08 | Discharge: 2020-09-08 | Disposition: A | Discharge status: Home / Self Care | Payer: No Typology Code available for payment source | Source: Ambulatory Visit | Attending: Radiation Oncology | Admitting: Radiation Oncology

## 2020-09-08 ENCOUNTER — Other Ambulatory Visit: Payer: Self-pay

## 2020-09-08 ENCOUNTER — Telehealth: Payer: Self-pay | Admitting: Family Medicine

## 2020-09-08 DIAGNOSIS — C711 Malignant neoplasm of frontal lobe: Secondary | ICD-10-CM | POA: Diagnosis not present

## 2020-09-08 LAB — URINE CULTURE: Culture: <10000 — AB

## 2020-09-08 MED FILL — DEXAMETHASONE 4 MG TABLET: 4 | 60 days supply | Qty: 60 | Fill #0

## 2020-09-08 NOTE — Telephone Encounter (Signed)
Forms on desk

## 2020-09-08 NOTE — Telephone Encounter (Signed)
Patient dropped off application for disability parking placard for Dr. Raoul Pitch to fill out. Form placed in Dr. Lucita Lora inbox in front office.

## 2020-09-08 NOTE — Telephone Encounter (Signed)
Form completed and returned to cma work basket 

## 2020-09-10 ENCOUNTER — Encounter: Payer: Self-pay | Admitting: Internal Medicine

## 2020-09-11 ENCOUNTER — Other Ambulatory Visit: Payer: Self-pay

## 2020-09-11 ENCOUNTER — Ambulatory Visit
Admission: RE | Admit: 2020-09-11 | Discharge: 2020-09-11 | Disposition: A | Discharge status: Home / Self Care | Payer: No Typology Code available for payment source | Source: Ambulatory Visit | Attending: Radiation Oncology | Admitting: Radiation Oncology

## 2020-09-11 DIAGNOSIS — C711 Malignant neoplasm of frontal lobe: Secondary | ICD-10-CM | POA: Diagnosis not present

## 2020-09-11 NOTE — Telephone Encounter (Signed)
Pt is aware and will like form mailed

## 2020-09-12 ENCOUNTER — Ambulatory Visit
Admission: RE | Admit: 2020-09-12 | Discharge: 2020-09-12 | Disposition: A | Discharge status: Home / Self Care | Payer: No Typology Code available for payment source | Source: Ambulatory Visit | Attending: Radiation Oncology | Admitting: Radiation Oncology

## 2020-09-12 DIAGNOSIS — C711 Malignant neoplasm of frontal lobe: Secondary | ICD-10-CM | POA: Diagnosis not present

## 2020-09-12 NOTE — Telephone Encounter (Signed)
Mailed form to patient today.

## 2020-09-13 ENCOUNTER — Ambulatory Visit
Admission: RE | Admit: 2020-09-13 | Discharge: 2020-09-13 | Disposition: A | Discharge status: Home / Self Care | Payer: No Typology Code available for payment source | Source: Ambulatory Visit | Attending: Radiation Oncology | Admitting: Radiation Oncology

## 2020-09-13 ENCOUNTER — Other Ambulatory Visit: Payer: Self-pay

## 2020-09-13 DIAGNOSIS — C711 Malignant neoplasm of frontal lobe: Secondary | ICD-10-CM | POA: Diagnosis not present

## 2020-09-14 ENCOUNTER — Ambulatory Visit
Admission: RE | Admit: 2020-09-14 | Discharge: 2020-09-14 | Disposition: A | Discharge status: Home / Self Care | Payer: No Typology Code available for payment source | Source: Ambulatory Visit | Attending: Radiation Oncology | Admitting: Radiation Oncology

## 2020-09-14 ENCOUNTER — Other Ambulatory Visit: Payer: Self-pay

## 2020-09-14 ENCOUNTER — Other Ambulatory Visit: Payer: Self-pay | Admitting: Internal Medicine

## 2020-09-14 ENCOUNTER — Inpatient Hospital Stay (HOSPITAL_BASED_OUTPATIENT_CLINIC_OR_DEPARTMENT_OTHER): Payer: No Typology Code available for payment source | Admitting: Internal Medicine

## 2020-09-14 VITALS — BP 116/75 | HR 55 | Temp 96.8°F | Resp 17 | Ht 64.0 in | Wt 134.4 lb

## 2020-09-14 DIAGNOSIS — R569 Unspecified convulsions: Secondary | ICD-10-CM | POA: Diagnosis not present

## 2020-09-14 DIAGNOSIS — C719 Malignant neoplasm of brain, unspecified: Secondary | ICD-10-CM | POA: Diagnosis not present

## 2020-09-14 DIAGNOSIS — C711 Malignant neoplasm of frontal lobe: Secondary | ICD-10-CM | POA: Diagnosis not present

## 2020-09-14 MED ORDER — DEXAMETHASONE 1 MG PO TABS: 2.0000 mg | ORAL_TABLET | Freq: Every day | ORAL | 1 refills | Status: DC

## 2020-09-14 MED FILL — DEXAMETHASONE 1 MG TABLET: 1 | 45 days supply | Qty: 90 | Fill #0

## 2020-09-14 NOTE — Progress Notes (Signed)
[facility] at [place], [place] [address] [phone]   Interval Evaluation  Date of Service: 09/14/20 Patient Name: Hannah [name] Patient MRN: [id number] Patient DOB: [birth year] Provider: [name], MD  Identifying Statement:  Hannah [name] is a 59 y.o. female with left frontal glioblastoma   Oncologic History: Oncology History  GBM (glioblastoma multiforme) ([facility])   Initial Diagnosis   GBM (glioblastoma multiforme) ([facility])   02/17/2020 Surgery   Craniotomy, left frontal resection by Dr. Marcello Moores.  Path is glioblastoma.   08/28/2020 -  Chemotherapy   The patient had temozolomide (TEMODAR) 20 MG capsule, 20 mg (100 % of original dose 20 mg), Oral, Daily, 0 of 1 cycle, Start date: 08/15/2020, End date: -- Dose modification: 20 mg (original dose 20 mg, Cycle 1) temozolomide (TEMODAR) 100 MG capsule, 100 mg (100 % of original dose 100 mg), Oral, Daily, 0 of 1 cycle, Start date: 08/15/2020, End date: -- Dose modification: 100 mg (original dose 100 mg, Cycle 1)  for chemotherapy treatment.      Biomarkers:  MGMT Methylated.  IDH 1/2 Wild type.  EGFR Unknown  TERT Unknown   Interval History:  Hannah [name] after having completed three weeks of IMRT and Temodar.  Right sided weakness has remained stable despite decreasing decadron down to 49m daily.  No further seizures or acute episodes of dysfunction. Still no issues with Temodar.  H+P (01/20/20) Patient presented one month ago with sudden onset left arm and leg weakness and interrupted speech, [name], [name], stroke eval and CNS imaging which demonstrated a non-enhancing left frontal mass.  At this time she is back to baseline without recurrence of events, taking Keppra 5055mtwice per day.  No history or seizure or any other  neurologic events.  She does describe being sleep deprived prior to seizure event.  She presents [name] after one month follow up MRI scan.  Medications: Current Outpatient Medications on File Prior to Visit  Medication Sig Dispense Refill  . dexamethasone (DECADRON) 4 MG tablet Take 1 tablet (4 mg total) by mouth daily. 60 tablet 1  . levETIRAcetam (KEPPRA) 500 MG tablet Take 1 tablet (500 mg total) by mouth 2 (two) times daily. 60 tablet 3  . Multiple Vitamins-Minerals (MULTIVITAL PO) Take 1 tablet by mouth daily.    . ondansetron (ZOFRAN) 8 MG tablet Take 1 tablet (8 mg total) by mouth 2 (two) times daily as needed (nausea and vomiting). May take 30-60 minutes prior to Temodar administration if nausea/vomiting occurs. 30 tablet 1  . temozolomide (TEMODAR) 100 MG capsule Take 1 capsule (100 mg total) by mouth daily. May take on an empty stomach to decrease nausea & vomiting. 42 capsule 0  . temozolomide (TEMODAR) 20 MG capsule Take 1 capsule (20 mg total) by mouth daily. May take on an empty stomach to decrease nausea & vomiting. 42 capsule 0  . guaiFENesin (HERBAL EXPEC PO) Take by mouth.    . Marland KitchenORazepam (ATIVAN) 0.5 MG tablet Take 1 tablet (0.5 mg total) by mouth daily. Take 30 min prior to treatment. (Patient not taking: Reported on 09/07/2020) 30 tablet 0   No current facility-administered medications on file prior to visit.    Allergies: No Known Allergies Past Medical History:  Past Medical History:  Diagnosis Date  .  Cervical spondylolysis 12/15/2019   Skeleton: Mild cervical spondylosis C6-7. Incidental find.   . Colon polyp   . Left ankle sprain 10/07/2011  . Seizure ([facility])    partial   . Tick bite 02/2020   Past Surgical History:  Past Surgical History:  Procedure Laterality Date  . APPLICATION OF CRANIAL NAVIGATION Left 02/16/2020   Procedure: APPLICATION OF CRANIAL NAVIGATION;  Surgeon: Vallarie Mare, MD;  Location: [facility];  Service: Neurosurgery;  Laterality: Left;  .  CRANIOTOMY Left 02/16/2020   Procedure: LEFT FRONTAL CRANIOTOMY FOR BRAIN TUMOR;  Surgeon: Vallarie Mare, MD;  Location: [facility];  Service: Neurosurgery;  Laterality: Left;  . FOOT SURGERY Left    bone spur - local anesthesia  . TONSILECTOMY/ADENOIDECTOMY WITH MYRINGOTOMY     tonsils and adenoids out only  . WISDOM TOOTH EXTRACTION     Social History:  Social History   Socioeconomic History  . Marital status: Married    Spouse name: Not on file  . Number of children: 3  . Years of education: Not on file  . Highest education level: Not on file  Occupational History  . Not on file  Tobacco Use  . Smoking status: Former Smoker    Years: 2.00    Types: Cigarettes    Quit date: 11/05/1979    Years since quitting: 40.8  . Smokeless tobacco: Never Used  . Tobacco comment: 1/2 pack a week  Vaping Use  . Vaping Use: Never used  Substance and Sexual Activity  . Alcohol use: Yes    Alcohol/week: 0.0 standard drinks    Comment: 1 glass of wine weekly  . Drug use: No  . Sexual activity: Yes    Birth control/protection: Surgical  Other Topics Concern  . Not on file  Social History Narrative  . Not on file   Social Determinants of Health   Financial Resource Strain:   . Difficulty of Paying Living Expenses: Not on file  Food Insecurity:   . Worried About Charity fundraiser in the Last Year: Not on file  . Ran Out of Food in the Last Year: Not on file  Transportation Needs:   . Lack of Transportation (Medical): Not on file  . Lack of Transportation (Non-Medical): Not on file  Physical Activity:   . Days of Exercise per Week: Not on file  . Minutes of Exercise per Session: Not on file  Stress:   . Feeling of Stress : Not on file  Social Connections:   . Frequency of Communication with Friends and Family: Not on file  . Frequency of Social Gatherings with Friends and Family: Not on file  . Attends Religious Services: Not on file  . Active Member of Clubs or Organizations:  Not on file  . Attends Archivist Meetings: Not on file  . Marital Status: Not on file  Intimate Partner Violence:   . Fear of Current or Ex-Partner: Not on file  . Emotionally Abused: Not on file  . Physically Abused: Not on file  . Sexually Abused: Not on file   Family History:  Family History  Problem Relation Age of Onset  . Hypertension Mother   . Stroke Mother   . Diabetes Mother   . Colon cancer Father 9  . Hypertension Sister   . Colon polyps Sister   . Heart disease Brother 75  . Diabetes Maternal Uncle   . Diabetes Maternal Uncle     Review of Systems: Constitutional:  Doesn't report fevers, chills or abnormal weight loss Eyes: Doesn't report blurriness of vision Ears, nose, mouth, throat, and face: Doesn't report sore throat Respiratory: Doesn't report cough, dyspnea or wheezes Cardiovascular: Doesn't report palpitation, chest discomfort  Gastrointestinal:  Doesn't report nausea, constipation, diarrhea GU: Doesn't report incontinence Skin: Doesn't report skin rashes Neurological: Per HPI Musculoskeletal: Doesn't report joint pain Behavioral/Psych: Doesn't report anxiety  Physical Exam: Vitals:   09/14/20 1433  BP: 116/75  Pulse: (!) 55  Resp: 17  Temp: (!) 96.8 F (36 C)  SpO2: 98%   KPS: 80. General: Alert, cooperative, pleasant, in no acute distress Head: Normal EENT: No conjunctival injection or scleral icterus.  Lungs: Resp effort normal Cardiac: Regular rate Abdomen: Non-distended abdomen Skin: No rashes cyanosis or petechiae. Extremities: No clubbing or edema  Neurologic Exam: Mental Status: Awake, alert, attentive to examiner. Oriented to self and environment. Language is fluent with intact comprehension.  Occasional interruptions in fluency.  Cranial Nerves: Visual acuity is grossly normal. Visual fields are full. Extra-ocular movements intact. No ptosis. Face is symmetric Motor: Tone and bulk are normal. Fine motor impairment in  right hand. Reflexes are symmetric, no pathologic reflexes present.  Sensory: Intact to light touch Gait: Dystaxic   Labs: I have reviewed the data as listed    Component Value Date/Time   NA 139 09/07/2020 1415   K 4.0 09/07/2020 1415   CL 103 09/07/2020 1415   CO2 28 09/07/2020 1415   GLUCOSE 126 (H) 09/07/2020 1415   GLUCOSE 85 10/13/2006 1158   BUN 17 09/07/2020 1415   CREATININE 0.90 09/07/2020 1415   CALCIUM 9.3 09/07/2020 1415   PROT 7.0 09/07/2020 1415   ALBUMIN 4.2 09/07/2020 1415   AST 20 09/07/2020 1415   ALT 26 09/07/2020 1415   ALKPHOS 46 09/07/2020 1415   BILITOT 0.6 09/07/2020 1415   GFRNONAA >60 09/07/2020 1415   GFRAA >60 02/17/2020 0136   Lab Results  Component Value Date   WBC 7.1 09/07/2020   NEUTROABS 6.6 09/07/2020   HGB 15.2 (H) 09/07/2020   HCT 44.8 09/07/2020   MCV 90.9 09/07/2020   PLT 259 09/07/2020     Assessment/Plan GBM (glioblastoma multiforme) (HCC) [C71.9]   Isela A Umbach is clinically stable [name] after having completed 3 weeks of IMRT and Temodar.  We ultimately recommended continuing with course of intensity modulated radiation therapy and concurrent daily Temozolomide.  Radiation will be administered Mon-Fri over 6 weeks, Temodar will be dosed at 54m/m2 to be given daily over 42 days.    Chemotherapy should be held for the following:  ANC less than 1,000  Platelets less than 100,000  LFT or creatinine greater than 2x ULN  If clinical concerns/contraindications develop  May decrease decadron to 215mdaily if tolerated.  Keppra should remain at 5002mID.  Every 2 weeks during radiation, labs will be checked accompanied by a clinical evaluation in the brain tumor clinic.   All questions were answered. The patient knows to call the clinic with any problems, questions or concerns. No barriers to learning were detected.  The total time spent in the encounter was 30 minutes and more than 50% was on counseling and review of  test results   ZacVentura SellersD Medical Director of Neuro-Oncology [facility] [place]/11/21 2:49 PM

## 2020-09-15 ENCOUNTER — Ambulatory Visit
Admission: RE | Admit: 2020-09-15 | Discharge: 2020-09-15 | Disposition: A | Discharge status: Home / Self Care | Payer: No Typology Code available for payment source | Source: Ambulatory Visit | Attending: Radiation Oncology | Admitting: Radiation Oncology

## 2020-09-15 ENCOUNTER — Telehealth: Payer: Self-pay | Admitting: Radiation Oncology

## 2020-09-15 ENCOUNTER — Other Ambulatory Visit: Payer: Self-pay

## 2020-09-15 DIAGNOSIS — C711 Malignant neoplasm of frontal lobe: Secondary | ICD-10-CM | POA: Diagnosis not present

## 2020-09-15 NOTE — Telephone Encounter (Signed)
Rachael Darby called from [employer] to confirm the CT SIM date and the start date for the patient's radiation tx.

## 2020-09-18 ENCOUNTER — Ambulatory Visit
Admission: RE | Admit: 2020-09-18 | Discharge: 2020-09-18 | Disposition: A | Discharge status: Home / Self Care | Payer: No Typology Code available for payment source | Source: Ambulatory Visit | Attending: Radiation Oncology | Admitting: Radiation Oncology

## 2020-09-18 ENCOUNTER — Encounter: Payer: Self-pay | Admitting: *Deleted

## 2020-09-18 ENCOUNTER — Encounter: Payer: Self-pay | Admitting: Internal Medicine

## 2020-09-18 ENCOUNTER — Other Ambulatory Visit: Payer: Self-pay

## 2020-09-18 DIAGNOSIS — C711 Malignant neoplasm of frontal lobe: Secondary | ICD-10-CM | POA: Diagnosis not present

## 2020-09-19 ENCOUNTER — Ambulatory Visit
Admission: RE | Admit: 2020-09-19 | Discharge: 2020-09-19 | Disposition: A | Discharge status: Home / Self Care | Payer: No Typology Code available for payment source | Source: Ambulatory Visit | Attending: Radiation Oncology | Admitting: Radiation Oncology

## 2020-09-19 DIAGNOSIS — C711 Malignant neoplasm of frontal lobe: Secondary | ICD-10-CM | POA: Diagnosis not present

## 2020-09-20 ENCOUNTER — Ambulatory Visit
Admission: RE | Admit: 2020-09-20 | Discharge: 2020-09-20 | Disposition: A | Discharge status: Home / Self Care | Payer: No Typology Code available for payment source | Source: Ambulatory Visit | Attending: Radiation Oncology | Admitting: Radiation Oncology

## 2020-09-20 DIAGNOSIS — C711 Malignant neoplasm of frontal lobe: Secondary | ICD-10-CM | POA: Diagnosis not present

## 2020-09-21 ENCOUNTER — Other Ambulatory Visit: Payer: Self-pay

## 2020-09-21 ENCOUNTER — Inpatient Hospital Stay: Payer: No Typology Code available for payment source

## 2020-09-21 ENCOUNTER — Inpatient Hospital Stay (HOSPITAL_BASED_OUTPATIENT_CLINIC_OR_DEPARTMENT_OTHER): Payer: No Typology Code available for payment source | Admitting: Internal Medicine

## 2020-09-21 ENCOUNTER — Ambulatory Visit
Admission: RE | Admit: 2020-09-21 | Discharge: 2020-09-21 | Disposition: A | Discharge status: Home / Self Care | Payer: No Typology Code available for payment source | Source: Ambulatory Visit | Attending: Radiation Oncology | Admitting: Radiation Oncology

## 2020-09-21 VITALS — BP 107/74 | HR 56 | Temp 97.1°F | Ht 64.0 in | Wt 135.1 lb

## 2020-09-21 DIAGNOSIS — R569 Unspecified convulsions: Secondary | ICD-10-CM

## 2020-09-21 DIAGNOSIS — C719 Malignant neoplasm of brain, unspecified: Secondary | ICD-10-CM | POA: Diagnosis not present

## 2020-09-21 DIAGNOSIS — C711 Malignant neoplasm of frontal lobe: Secondary | ICD-10-CM | POA: Diagnosis not present

## 2020-09-21 LAB — CBC WITH DIFFERENTIAL (CANCER CENTER ONLY)
Abs Immature Granulocytes: 0.02 10*3/uL (ref 0.00–0.07)
Basophils Absolute: 0 10*3/uL (ref 0.0–0.1)
Basophils Relative: 1 %
Eosinophils Absolute: 0.1 10*3/uL (ref 0.0–0.5)
Eosinophils Relative: 2 %
HCT: 40.3 % (ref 36.0–46.0)
Hemoglobin: 13.4 g/dL (ref 12.0–15.0)
Immature Granulocytes: 0 %
Lymphocytes Relative: 9 %
Lymphs Abs: 0.5 10*3/uL — ABNORMAL LOW (ref 0.7–4.0)
MCH: 31.2 pg (ref 26.0–34.0)
MCHC: 33.3 g/dL (ref 30.0–36.0)
MCV: 93.7 fL (ref 80.0–100.0)
Monocytes Absolute: 0.5 10*3/uL (ref 0.1–1.0)
Monocytes Relative: 7 %
Neutro Abs: 5.1 10*3/uL (ref 1.7–7.7)
Neutrophils Relative %: 81 %
Platelet Count: 191 10*3/uL (ref 150–400)
RBC: 4.3 MIL/uL (ref 3.87–5.11)
RDW: 12.8 % (ref 11.5–15.5)
WBC Count: 6.3 10*3/uL (ref 4.0–10.5)
nRBC: 0 % (ref 0.0–0.2)

## 2020-09-21 LAB — CMP (CANCER CENTER ONLY)
ALT: 19 U/L (ref 0–44)
AST: 16 U/L (ref 15–41)
Albumin: 3.8 g/dL (ref 3.5–5.0)
Alkaline Phosphatase: 36 U/L — ABNORMAL LOW (ref 38–126)
Anion gap: 4 — ABNORMAL LOW (ref 5–15)
BUN: 15 mg/dL (ref 6–20)
CO2: 31 mmol/L (ref 22–32)
Calcium: 9.1 mg/dL (ref 8.9–10.3)
Chloride: 106 mmol/L (ref 98–111)
Creatinine: 1.01 mg/dL — ABNORMAL HIGH (ref 0.44–1.00)
GFR, Estimated: >60 mL/min (ref >60)
Glucose, Bld: 102 mg/dL — ABNORMAL HIGH (ref 70–99)
Potassium: 5.3 mmol/L — ABNORMAL HIGH (ref 3.5–5.1)
Sodium: 141 mmol/L (ref 135–145)
Total Bilirubin: 0.4 mg/dL (ref 0.3–1.2)
Total Protein: 6.1 g/dL — ABNORMAL LOW (ref 6.5–8.1)

## 2020-09-21 NOTE — Progress Notes (Signed)
[facility] [place], [place] [id number] [phone]   Interval Evaluation  Date of Service: 09/21/20 Patient Name: Hannah [name] Patient MRN: [id number] Patient DOB: [birth year] Provider: [name], MD  Identifying Statement:  Hannah [name] is a 59 y.o. female with left frontal glioblastoma   Oncologic History: Oncology History  GBM (glioblastoma multiforme) ([place])   Initial Diagnosis   GBM (glioblastoma multiforme) ([place])   02/17/2020 Surgery   Craniotomy, left frontal resection by Dr. Marcello Moores.  Path is glioblastoma.   08/28/2020 -  Chemotherapy   The patient had temozolomide (TEMODAR) 20 MG capsule, 20 mg (100 % of original dose 20 mg), Oral, Daily, 0 of 1 cycle, Start date: 08/15/2020, End date: -- Dose modification: 20 mg (original dose 20 mg, Cycle 1) temozolomide (TEMODAR) 100 MG capsule, 100 mg (100 % of original dose 100 mg), Oral, Daily, 0 of 1 cycle, Start date: 08/15/2020, End date: -- Dose modification: 100 mg (original dose 100 mg, Cycle 1)  for chemotherapy treatment.      Biomarkers:  MGMT Methylated.  IDH 1/2 Wild type.  EGFR Unknown  TERT Unknown   Interval History:  Hannah [name] presents today after having completed four weeks of IMRT and Temodar.  Right sided weakness not worsened despite titrating decadron down to 87m daily.  Able to walk independently although gets a little imbalance at the end of the day at times. No further seizures or acute episodes of dysfunction. Still no issues with Temodar.  H+P (01/20/20) Patient presented one month ago with sudden onset left arm and leg weakness and interrupted speech, [name], [name], stroke eval and CNS imaging which demonstrated a non-enhancing left frontal mass.  At this time she is back to baseline without recurrence of  events, taking Keppra 5069mtwice per day.  No history or seizure or any other neurologic events.  She does describe being sleep deprived prior to seizure event.  She presents today after one month follow up MRI scan.  Medications: Current Outpatient Medications on File Prior to Visit  Medication Sig Dispense Refill  . dexamethasone (DECADRON) 1 MG tablet Take 2 tablets (2 mg total) by mouth daily. 90 tablet 1  . levETIRAcetam (KEPPRA) 500 MG tablet Take 1 tablet (500 mg total) by mouth 2 (two) times daily. 60 tablet 3  . Multiple Vitamins-Minerals (MULTIVITAL PO) Take 1 tablet by mouth daily.    . ondansetron (ZOFRAN) 8 MG tablet Take 1 tablet (8 mg total) by mouth 2 (two) times daily as needed (nausea and vomiting). May take 30-60 minutes prior to Temodar administration if nausea/vomiting occurs. 30 tablet 1  . temozolomide (TEMODAR) 100 MG capsule Take 1 capsule (100 mg total) by mouth daily. May take on an empty stomach to decrease nausea & vomiting. 42 capsule 0  . temozolomide (TEMODAR) 20 MG capsule Take 1 capsule (20 mg total) by mouth daily. May take on an empty stomach to decrease nausea & vomiting. 42 capsule 0   No current facility-administered medications on file prior to visit.    Allergies: No Known Allergies Past Medical History:  Past Medical History:  Diagnosis Date  . Cervical spondylolysis 12/15/2019   Skeleton: Mild cervical spondylosis C6-7. Incidental find.   . Colon polyp   . Left ankle sprain 10/07/2011  .  Seizure ([facility])    partial   . Tick bite 02/2020   Past Surgical History:  Past Surgical History:  Procedure Laterality Date  . APPLICATION OF CRANIAL NAVIGATION Left 02/16/2020   Procedure: APPLICATION OF CRANIAL NAVIGATION;  Surgeon: Vallarie Mare, MD;  Location: [facility];  Service: Neurosurgery;  Laterality: Left;  . CRANIOTOMY Left 02/16/2020   Procedure: LEFT FRONTAL CRANIOTOMY FOR BRAIN TUMOR;  Surgeon: Vallarie Mare, MD;  Location: [facility];  Service:  Neurosurgery;  Laterality: Left;  . FOOT SURGERY Left    bone spur - local anesthesia  . TONSILECTOMY/ADENOIDECTOMY WITH MYRINGOTOMY     tonsils and adenoids out only  . WISDOM TOOTH EXTRACTION     Social History:  Social History   Socioeconomic History  . Marital status: Married    Spouse name: Not on file  . Number of children: 3  . Years of education: Not on file  . Highest education level: Not on file  Occupational History  . Not on file  Tobacco Use  . Smoking status: Former Smoker    Years: 2.00    Types: Cigarettes    Quit date: 11/05/1979    Years since quitting: 40.9  . Smokeless tobacco: Never Used  . Tobacco comment: 1/2 pack a week  Vaping Use  . Vaping Use: Never used  Substance and Sexual Activity  . Alcohol use: Yes    Alcohol/week: 0.0 standard drinks    Comment: 1 glass of wine weekly  . Drug use: No  . Sexual activity: Yes    Birth control/protection: Surgical  Other Topics Concern  . Not on file  Social History Narrative  . Not on file   Social Determinants of Health   Financial Resource Strain:   . Difficulty of Paying Living Expenses: Not on file  Food Insecurity:   . Worried About Charity fundraiser in the Last Year: Not on file  . Ran Out of Food in the Last Year: Not on file  Transportation Needs:   . Lack of Transportation (Medical): Not on file  . Lack of Transportation (Non-Medical): Not on file  Physical Activity:   . Days of Exercise per Week: Not on file  . Minutes of Exercise per Session: Not on file  Stress:   . Feeling of Stress : Not on file  Social Connections:   . Frequency of Communication with Friends and Family: Not on file  . Frequency of Social Gatherings with Friends and Family: Not on file  . Attends Religious Services: Not on file  . Active Member of Clubs or Organizations: Not on file  . Attends Archivist Meetings: Not on file  . Marital Status: Not on file  Intimate Partner Violence:   . Fear of  Current or Ex-Partner: Not on file  . Emotionally Abused: Not on file  . Physically Abused: Not on file  . Sexually Abused: Not on file   Family History:  Family History  Problem Relation Age of Onset  . Hypertension Mother   . Stroke Mother   . Diabetes Mother   . Colon cancer Father 94  . Hypertension Sister   . Colon polyps Sister   . Heart disease Brother 18  . Diabetes Maternal Uncle   . Diabetes Maternal Uncle     Review of Systems: Constitutional: Doesn't report fevers, chills or abnormal weight loss Eyes: Doesn't report blurriness of vision Ears, nose, mouth, throat, and face: Doesn't report sore throat Respiratory: Doesn't  report cough, dyspnea or wheezes Cardiovascular: Doesn't report palpitation, chest discomfort  Gastrointestinal:  Doesn't report nausea, constipation, diarrhea GU: Doesn't report incontinence Skin: Doesn't report skin rashes Neurological: Per HPI Musculoskeletal: Doesn't report joint pain Behavioral/Psych: Doesn't report anxiety  Physical Exam: Vitals:   09/21/20 1417  BP: 107/74  Pulse: (!) 56  Temp: (!) 97.1 F (36.2 C)  SpO2: 97%   KPS: 80. General: Alert, cooperative, pleasant, in no acute distress Head: Normal EENT: No conjunctival injection or scleral icterus.  Lungs: Resp effort normal Cardiac: Regular rate Abdomen: Non-distended abdomen Skin: No rashes cyanosis or petechiae. Extremities: No clubbing or edema  Neurologic Exam: Mental Status: Awake, alert, attentive to examiner. Oriented to self and environment. Language is fluent with intact comprehension.  Occasional interruptions in fluency.  Cranial Nerves: Visual acuity is grossly normal. Visual fields are full. Extra-ocular movements intact. No ptosis. Face is symmetric Motor: Tone and bulk are normal. Fine motor impairment in right hand. Reflexes are symmetric, no pathologic reflexes present.  Sensory: Intact to light touch Gait: Dystaxic   Labs: I have reviewed the  data as listed    Component Value Date/Time   NA 139 09/07/2020 1415   K 4.0 09/07/2020 1415   CL 103 09/07/2020 1415   CO2 28 09/07/2020 1415   GLUCOSE 126 (H) 09/07/2020 1415   GLUCOSE 85 10/13/2006 1158   BUN 17 09/07/2020 1415   CREATININE 0.90 09/07/2020 1415   CALCIUM 9.3 09/07/2020 1415   PROT 7.0 09/07/2020 1415   ALBUMIN 4.2 09/07/2020 1415   AST 20 09/07/2020 1415   ALT 26 09/07/2020 1415   ALKPHOS 46 09/07/2020 1415   BILITOT 0.6 09/07/2020 1415   GFRNONAA >60 09/07/2020 1415   GFRAA >60 02/17/2020 0136   Lab Results  Component Value Date   WBC 6.3 09/21/2020   NEUTROABS 5.1 09/21/2020   HGB 13.4 09/21/2020   HCT 40.3 09/21/2020   MCV 93.7 09/21/2020   PLT 191 09/21/2020     Assessment/Plan GBM (glioblastoma multiforme) (HCC) [C71.9]   Hannah [name] is clinically stable today after having completed 4 weeks of IMRT and Temodar.  Labs are within normal limits.  We ultimately recommended continuing with course of intensity modulated radiation therapy and concurrent daily Temozolomide.  Radiation will be administered Mon-Fri over 6 weeks, Temodar will be dosed at 50m/m2 to be given daily over 42 days.    Chemotherapy should be held for the following:  ANC less than 1,000  Platelets less than 100,000  LFT or creatinine greater than 2x ULN  If clinical concerns/contraindications develop  Can decrease decadron down to 143mdaily.  Keppra will remain at 50075mID.  Every 2 weeks during radiation, labs will be checked accompanied by a clinical evaluation in the brain tumor clinic.   All questions were answered. The patient knows to call the clinic with any problems, questions or concerns. No barriers to learning were detected.  The total time spent in the encounter was 30 minutes and more than 50% was on counseling and review of test results   ZacVentura SellersD Medical Director of Neuro-Oncology [facility] [place]/18/21  2:45 PM

## 2020-09-22 ENCOUNTER — Telehealth: Payer: Self-pay

## 2020-09-22 ENCOUNTER — Other Ambulatory Visit: Payer: Self-pay

## 2020-09-22 ENCOUNTER — Ambulatory Visit
Admission: RE | Admit: 2020-09-22 | Discharge: 2020-09-22 | Disposition: A | Discharge status: Home / Self Care | Payer: No Typology Code available for payment source | Source: Ambulatory Visit | Attending: Radiation Oncology | Admitting: Radiation Oncology

## 2020-09-22 DIAGNOSIS — C711 Malignant neoplasm of frontal lobe: Secondary | ICD-10-CM | POA: Diagnosis not present

## 2020-09-22 NOTE — Telephone Encounter (Signed)
Hannah [name] with [employer] LM needing a verbal for the date the pt was taken back off work. She indicated the letter may have been sent to an invalid fax, therefore a verbal is all she needs.  CB# [phone] Claim# [id number]

## 2020-09-24 ENCOUNTER — Ambulatory Visit
Admission: RE | Admit: 2020-09-24 | Discharge: 2020-09-24 | Disposition: A | Discharge status: Home / Self Care | Payer: No Typology Code available for payment source | Source: Ambulatory Visit | Attending: Radiation Oncology | Admitting: Radiation Oncology

## 2020-09-24 ENCOUNTER — Other Ambulatory Visit: Payer: Self-pay

## 2020-09-24 DIAGNOSIS — C711 Malignant neoplasm of frontal lobe: Secondary | ICD-10-CM | POA: Diagnosis not present

## 2020-09-25 ENCOUNTER — Telehealth: Payer: Self-pay | Admitting: *Deleted

## 2020-09-25 ENCOUNTER — Other Ambulatory Visit: Payer: Self-pay

## 2020-09-25 ENCOUNTER — Ambulatory Visit
Admission: RE | Admit: 2020-09-25 | Discharge: 2020-09-25 | Disposition: A | Discharge status: Home / Self Care | Payer: No Typology Code available for payment source | Source: Ambulatory Visit | Attending: Radiation Oncology | Admitting: Radiation Oncology

## 2020-09-25 ENCOUNTER — Encounter: Payer: Self-pay | Admitting: *Deleted

## 2020-09-25 DIAGNOSIS — C711 Malignant neoplasm of frontal lobe: Secondary | ICD-10-CM | POA: Diagnosis not present

## 2020-09-25 MED FILL — ONDANSETRON HCL 8 MG TABLET: 8 | 15 days supply | Qty: 30 | Fill #0

## 2020-09-25 NOTE — Telephone Encounter (Signed)
Returned call to [employer].  Had to leave a voicemail to advise patient is medically disabled.  We can fax the letter that was prepared by Dr Mickeal Skinner if she would return the call and provide that information.

## 2020-09-25 NOTE — Telephone Encounter (Signed)
Err

## 2020-09-26 ENCOUNTER — Ambulatory Visit
Admission: RE | Admit: 2020-09-26 | Discharge: 2020-09-26 | Disposition: A | Discharge status: Home / Self Care | Payer: No Typology Code available for payment source | Source: Ambulatory Visit | Attending: Radiation Oncology | Admitting: Radiation Oncology

## 2020-09-26 ENCOUNTER — Telehealth: Payer: Self-pay

## 2020-09-26 DIAGNOSIS — C711 Malignant neoplasm of frontal lobe: Secondary | ICD-10-CM | POA: Diagnosis not present

## 2020-09-26 NOTE — Telephone Encounter (Signed)
Nutrition Assessment   Reason for Assessment:  Patient identified on Malnutrition Screening tool for weight loss   ASSESSMENT:  59 year old female with left frontal glioblastoma.  S/p craniotomy 02/17/20.  Receiving radiation and temodar.    Spoke with patient via phone and introduce self and service at [facility].  Patient reports intentionally loosing part of the weight due to being on the ketogenic diet.  Does report dose of keppra increased which does decrease appetite.  Reports that she has been eating fruit, yogurt and egg for breakfast.  Lunch has been soups that family and friends have prepared.  Dinner last night was sweet potato soup.     Medications: MVI, decadron, zofran   Labs: reviewed   Anthropometrics:   Height: 64 inches Weight: 135 lb 11/18 154 lb 02/2020 Reports weight prior to children 133 lb BMI: 23  5% weight loss in the last month, part of that was intentional weight loss   NUTRITION DIAGNOSIS: none at this time   INTERVENTION:  Encouraged weight maintenance during treatment.   Encouraged well balanced diet with good sources of protein  Contact information provided and will reach out to RD with questions or concerns   Next Visit: no follow-up  Aidynn Polendo B. Zenia Resides, [place], [place] Registered Dietitian [phone] (mobile)

## 2020-09-27 ENCOUNTER — Ambulatory Visit
Admission: RE | Admit: 2020-09-27 | Discharge: 2020-09-27 | Disposition: A | Discharge status: Home / Self Care | Payer: No Typology Code available for payment source | Source: Ambulatory Visit | Attending: Radiation Oncology | Admitting: Radiation Oncology

## 2020-09-27 DIAGNOSIS — C711 Malignant neoplasm of frontal lobe: Secondary | ICD-10-CM | POA: Diagnosis not present

## 2020-10-01 ENCOUNTER — Ambulatory Visit: Payer: No Typology Code available for payment source

## 2020-10-02 ENCOUNTER — Ambulatory Visit: Payer: No Typology Code available for payment source

## 2020-10-02 ENCOUNTER — Encounter: Payer: Self-pay | Admitting: Internal Medicine

## 2020-10-02 ENCOUNTER — Other Ambulatory Visit: Payer: Self-pay

## 2020-10-02 ENCOUNTER — Ambulatory Visit
Admission: RE | Admit: 2020-10-02 | Discharge: 2020-10-02 | Disposition: A | Discharge status: Home / Self Care | Payer: No Typology Code available for payment source | Source: Ambulatory Visit | Attending: Radiation Oncology | Admitting: Radiation Oncology

## 2020-10-02 DIAGNOSIS — C711 Malignant neoplasm of frontal lobe: Secondary | ICD-10-CM | POA: Diagnosis not present

## 2020-10-03 ENCOUNTER — Ambulatory Visit
Admission: RE | Admit: 2020-10-03 | Discharge: 2020-10-03 | Disposition: A | Discharge status: Home / Self Care | Payer: No Typology Code available for payment source | Source: Ambulatory Visit | Attending: Radiation Oncology | Admitting: Radiation Oncology

## 2020-10-03 ENCOUNTER — Ambulatory Visit (HOSPITAL_COMMUNITY)
Admission: RE | Admit: 2020-10-03 | Discharge: 2020-10-03 | Disposition: A | Discharge status: Home / Self Care | Payer: No Typology Code available for payment source | Source: Ambulatory Visit | Attending: Medical | Admitting: Medical

## 2020-10-03 ENCOUNTER — Other Ambulatory Visit: Payer: Self-pay | Admitting: Emergency Medicine

## 2020-10-03 ENCOUNTER — Inpatient Hospital Stay (HOSPITAL_BASED_OUTPATIENT_CLINIC_OR_DEPARTMENT_OTHER): Payer: No Typology Code available for payment source | Admitting: Medical

## 2020-10-03 ENCOUNTER — Other Ambulatory Visit: Payer: Self-pay

## 2020-10-03 ENCOUNTER — Inpatient Hospital Stay: Payer: No Typology Code available for payment source

## 2020-10-03 ENCOUNTER — Ambulatory Visit: Payer: No Typology Code available for payment source

## 2020-10-03 VITALS — BP 122/84 | HR 67 | Temp 97.8°F | Resp 17 | Ht 64.0 in | Wt 134.0 lb

## 2020-10-03 DIAGNOSIS — K5909 Other constipation: Secondary | ICD-10-CM

## 2020-10-03 DIAGNOSIS — R109 Unspecified abdominal pain: Secondary | ICD-10-CM | POA: Insufficient documentation

## 2020-10-03 DIAGNOSIS — C711 Malignant neoplasm of frontal lobe: Secondary | ICD-10-CM | POA: Diagnosis not present

## 2020-10-03 DIAGNOSIS — R112 Nausea with vomiting, unspecified: Secondary | ICD-10-CM | POA: Diagnosis not present

## 2020-10-03 DIAGNOSIS — C719 Malignant neoplasm of brain, unspecified: Secondary | ICD-10-CM

## 2020-10-03 LAB — CBC WITH DIFFERENTIAL (CANCER CENTER ONLY)
Abs Immature Granulocytes: 0.02 10*3/uL (ref 0.00–0.07)
Basophils Absolute: 0 10*3/uL (ref 0.0–0.1)
Basophils Relative: 0 %
Eosinophils Absolute: 0 10*3/uL (ref 0.0–0.5)
Eosinophils Relative: 0 %
HCT: 44.3 % (ref 36.0–46.0)
Hemoglobin: 14.9 g/dL (ref 12.0–15.0)
Immature Granulocytes: 0 %
Lymphocytes Relative: 2 %
Lymphs Abs: 0.2 10*3/uL — ABNORMAL LOW (ref 0.7–4.0)
MCH: 32.3 pg (ref 26.0–34.0)
MCHC: 33.6 g/dL (ref 30.0–36.0)
MCV: 95.9 fL (ref 80.0–100.0)
Monocytes Absolute: 0.6 10*3/uL (ref 0.1–1.0)
Monocytes Relative: 7 %
Neutro Abs: 7.7 10*3/uL (ref 1.7–7.7)
Neutrophils Relative %: 91 %
Platelet Count: 192 10*3/uL (ref 150–400)
RBC: 4.62 MIL/uL (ref 3.87–5.11)
RDW: 12.9 % (ref 11.5–15.5)
WBC Count: 8.5 10*3/uL (ref 4.0–10.5)
nRBC: 0 % (ref 0.0–0.2)

## 2020-10-03 LAB — CMP (CANCER CENTER ONLY)
ALT: 19 U/L (ref 0–44)
AST: 20 U/L (ref 15–41)
Albumin: 4 g/dL (ref 3.5–5.0)
Alkaline Phosphatase: 46 U/L (ref 38–126)
Anion gap: 12 (ref 5–15)
BUN: 12 mg/dL (ref 6–20)
CO2: 24 mmol/L (ref 22–32)
Calcium: 10.5 mg/dL — ABNORMAL HIGH (ref 8.9–10.3)
Chloride: 104 mmol/L (ref 98–111)
Creatinine: 0.84 mg/dL (ref 0.44–1.00)
GFR, Estimated: >60 mL/min (ref >60)
Glucose, Bld: 101 mg/dL — ABNORMAL HIGH (ref 70–99)
Potassium: 3.6 mmol/L (ref 3.5–5.1)
Sodium: 140 mmol/L (ref 135–145)
Total Bilirubin: 0.9 mg/dL (ref 0.3–1.2)
Total Protein: 6.8 g/dL (ref 6.5–8.1)

## 2020-10-03 MED ORDER — SODIUM CHLORIDE 0.9 % IV SOLN
INTRAVENOUS | Status: DC
  Filled 2020-10-03 (×2): qty 250

## 2020-10-03 MED ORDER — ATROPINE SULFATE 1 MG/ML IJ SOLN
INTRAMUSCULAR | Status: AC
  Filled 2020-10-03: qty 1

## 2020-10-03 MED ORDER — LORAZEPAM 0.5 MG PO TABS: 0.5000 mg | ORAL_TABLET | Freq: Four times a day (QID) | ORAL | 0 refills | Status: DC | PRN

## 2020-10-03 MED ORDER — ATROPINE SULFATE 1 MG/ML IJ SOLN
0.5000 mg | Freq: Once | INTRAMUSCULAR | Status: AC
  Administered 2020-10-03: 0.5 mg via INTRAVENOUS

## 2020-10-03 MED FILL — LORazepam 0.5 MG TABS: 0.5 | 11 days supply | Qty: 45 | Fill #0

## 2020-10-03 NOTE — Progress Notes (Addendum)
Symptoms Management Clinic Progress Note   Hannah [name] [id number] 1961-05-22 59 y.o.  Hannah [name] is managed by Dr. Cecil Cobbs  Actively treated with chemotherapy/immunotherapy/hormonal therapy: yes  Current therapy: Radiation and Temodar  Next scheduled appointment with provider: 10/05/2020  Assessment: Plan:    GBM (glioblastoma multiforme) (HCC)  Nausea and vomiting, intractability of vomiting not specified, unspecified vomiting type - Plan: LORazepam (ATIVAN) 0.5 MG tablet  Other constipation - Plan: DG Abd 2 Views, 0.9 %  sodium chloride infusion  Abdominal cramps - Plan: DG Abd 2 Views, 0.9 %  sodium chloride infusion, atropine injection 0.5 mg   Glioblastoma multiforme: Hannah [name] continues to be followed by Dr. Cecil Cobbs and is currently treated with Temodar and radiation therapy.  She is scheduled to see Dr. Mickeal Skinner in follow-up on 10/05/2020.  She has completed 24 fractions of radiation therapy today and has begun on boost treatments of radiation therapy on 09/27/2020 and will complete radiation therapy on 10/02/2020.  Nausea with heaves: Hannah [name] has been taking Zofran twice daily without resolution of her nausea.  She was given a prescription for Zofran 0.5 mg sublingual every 6 hours as needed for nausea.  Constipation and abdominal cramps: Hannah [name] was referred for a KUB which returned showing:   FINDINGS: The bowel gas pattern is normal.  There is no evidence of free air. No radio-opaque calculi or other significant radiographic abnormality is seen.  IMPRESSION: Negative.  Hannah [name] was given 1 L of normal saline over 1 hour and was given 0.5 mg of atropine IV with improvement in her abdominal pain.  She was also given an instruction sheet on bowel regiment for controlling constipation.  Please see After Visit Summary for patient specific instructions.  Future Appointments  Date Time Provider [place]  10/04/2020   1:45 PM [facility] LINAC 4 [facility] None  10/05/2020  1:45 PM [facility] LINAC 4 [facility] None  10/05/2020  2:00 PM [facility] LAB [facility] None  10/05/2020  2:30 PM Vaslow, Acey Lav, MD [facility] None  10/06/2020  1:45 PM [facility] LINAC 4 [facility] None  10/09/2020  1:45 PM [facility] LINAC 4 [facility] None  11/02/2020  9:40 AM GI-315 MR 3 GI-315MRI GI-[address]  11/09/2020 12:00 PM [facility] LAB [facility] None  11/09/2020 12:30 PM Vaslow, Acey Lav, MD [facility] None    Orders Placed This Encounter  Procedures  . DG Abd 2 Views       Subjective:   Patient ID:  Hannah [name] is a 59 y.o. (DOB Dec 28, 1960) female.  Chief Complaint:  Chief Complaint  Patient presents with  . Abdominal Pain    HPI Hannah [name]  is a 59 y.o. female with a diagnosis of a glioblastoma multiforme.  She is managed by Dr. Cecil Cobbs and is currently treated with Temodar and radiation therapy.  She has completed 24 fractions of radiation therapy today and has begun on boost treatments of radiation therapy on 09/27/2020 and will complete radiation therapy on 10/02/2020.  She presents to the clinic today with nausea, vomiting without relief of her oral antiemetics.  She is also having constipation for the last 2 days and is having severe abdominal cramps.  She used a fleets enema this morning with little benefit.  She is passing minimal gas.  She denies fevers, chills, sweats, chest pain, shortness of breath, and dizziness.  She was referred for a KUB today which returned showing:  FINDINGS: The bowel gas pattern is normal.  There is no evidence of free air. No radio-opaque calculi or other significant radiographic abnormality is seen.  IMPRESSION: Negative.   Medications: I have reviewed the patient's current medications.  Allergies: No Known Allergies  Past Medical History:  Diagnosis Date  . Cervical spondylolysis 12/15/2019   Skeleton: Mild cervical spondylosis  C6-7. Incidental find.   . Colon polyp   . Left ankle sprain 10/07/2011  . Seizure ([place])    partial   . Tick bite 02/2020    Past Surgical History:  Procedure Laterality Date  . APPLICATION OF CRANIAL NAVIGATION Left 02/16/2020   Procedure: APPLICATION OF CRANIAL NAVIGATION;  Surgeon: Vallarie Mare, MD;  Location: [facility];  Service: Neurosurgery;  Laterality: Left;  . CRANIOTOMY Left 02/16/2020   Procedure: LEFT FRONTAL CRANIOTOMY FOR BRAIN TUMOR;  Surgeon: Vallarie Mare, MD;  Location: [facility];  Service: Neurosurgery;  Laterality: Left;  . FOOT SURGERY Left    bone spur - local anesthesia  . TONSILECTOMY/ADENOIDECTOMY WITH MYRINGOTOMY     tonsils and adenoids out only  . WISDOM TOOTH EXTRACTION      Family History  Problem Relation Age of Onset  . Hypertension Mother   . Stroke Mother   . Diabetes Mother   . Colon cancer Father 46  . Hypertension Sister   . Colon polyps Sister   . Heart disease Brother 64  . Diabetes Maternal Uncle   . Diabetes Maternal Uncle     Social History   Socioeconomic History  . Marital status: Married    Spouse name: Not on file  . Number of children: 3  . Years of education: Not on file  . Highest education level: Not on file  Occupational History  . Not on file  Tobacco Use  . Smoking status: Former Smoker    Years: 2.00    Types: Cigarettes    Quit date: 11/05/1979    Years since quitting: 40.9  . Smokeless tobacco: Never Used  . Tobacco comment: 1/2 pack a week  Vaping Use  . Vaping Use: Never used  Substance and Sexual Activity  . Alcohol use: Yes    Alcohol/week: 0.0 standard drinks    Comment: 1 glass of wine weekly  . Drug use: No  . Sexual activity: Yes    Birth control/protection: Surgical  Other Topics Concern  . Not on file  Social History Narrative  . Not on file   Social Determinants of Health   Financial Resource Strain:   . Difficulty of Paying Living Expenses: Not on file  Food Insecurity:   . Worried  About Charity fundraiser in the Last Year: Not on file  . Ran Out of Food in the Last Year: Not on file  Transportation Needs:   . Lack of Transportation (Medical): Not on file  . Lack of Transportation (Non-Medical): Not on file  Physical Activity:   . Days of Exercise per Week: Not on file  . Minutes of Exercise per Session: Not on file  Stress:   . Feeling of Stress : Not on file  Social Connections:   . Frequency of Communication with Friends and Family: Not on file  . Frequency of Social Gatherings with Friends and Family: Not on file  . Attends Religious Services: Not on file  . Active Member of Clubs or Organizations: Not on file  . Attends Archivist Meetings: Not on file  . Marital Status: Not on file  Intimate Partner Violence:   .  Fear of Current or Ex-Partner: Not on file  . Emotionally Abused: Not on file  . Physically Abused: Not on file  . Sexually Abused: Not on file    Past Medical History, Surgical history, Social history, and Family history were reviewed and updated as appropriate.   Please see review of systems for further details on the patient's review from today.   Review of Systems:  Review of Systems  Constitutional: Negative for appetite change, chills, diaphoresis, fever and unexpected weight change.  HENT: Negative for trouble swallowing and voice change.   Respiratory: Negative for cough, chest tightness, shortness of breath and wheezing.   Cardiovascular: Negative for chest pain and palpitations.  Gastrointestinal: Positive for abdominal pain, constipation, nausea and vomiting. Negative for abdominal distention, anal bleeding, blood in stool, diarrhea and rectal pain.  Musculoskeletal: Negative for back pain and myalgias.  Neurological: Negative for dizziness, light-headedness and headaches.    Objective:   Physical Exam:  BP 122/84 (BP Location: Left Arm, Patient Position: Sitting)   Pulse 67   Temp 97.8 F (36.6 C) (Tympanic)    Resp 17   Ht 5\' 4"  (1.626 m)   Wt 134 lb (60.8 kg)   LMP 06/30/2015   SpO2 97%   BMI 23.00 kg/m  ECOG: 1  Physical Exam Constitutional:      General: She is not in acute distress.    Appearance: She is not diaphoretic.  HENT:     Head: Normocephalic and atraumatic.  Eyes:     General: No scleral icterus.       Right eye: No discharge.        Left eye: No discharge.     Conjunctiva/sclera: Conjunctivae normal.  Cardiovascular:     Rate and Rhythm: Normal rate and regular rhythm.     Heart sounds: Normal heart sounds. No murmur heard.  No friction rub. No gallop.   Pulmonary:     Effort: Pulmonary effort is normal. No respiratory distress.     Breath sounds: Normal breath sounds. No wheezing or rales.  Abdominal:     General: Bowel sounds are decreased. There is no distension.     Palpations: Abdomen is soft. There is no mass.     Tenderness: There is abdominal tenderness. There is no guarding or rebound.  Skin:    General: Skin is warm and dry.     Findings: No erythema or rash.  Neurological:     Mental Status: She is alert.     Coordination: Coordination normal.  Psychiatric:        Behavior: Behavior normal.        Thought Content: Thought content normal.        Judgment: Judgment normal.     Lab Review:     Component Value Date/Time   NA 140 10/03/2020 1151   K 3.6 10/03/2020 1151   CL 104 10/03/2020 1151   CO2 24 10/03/2020 1151   GLUCOSE 101 (H) 10/03/2020 1151   GLUCOSE 85 10/13/2006 1158   BUN 12 10/03/2020 1151   CREATININE 0.84 10/03/2020 1151   CALCIUM 10.5 (H) 10/03/2020 1151   PROT 6.8 10/03/2020 1151   ALBUMIN 4.0 10/03/2020 1151   AST 20 10/03/2020 1151   ALT 19 10/03/2020 1151   ALKPHOS 46 10/03/2020 1151   BILITOT 0.9 10/03/2020 1151   GFRNONAA >60 10/03/2020 1151   GFRAA >60 02/17/2020 0136       Component Value Date/Time   WBC 8.5 10/03/2020 1151  WBC 3.8 (L) 08/02/2020 1414   RBC 4.62 10/03/2020 1151   HGB 14.9 10/03/2020 1151    HCT 44.3 10/03/2020 1151   PLT 192 10/03/2020 1151   MCV 95.9 10/03/2020 1151   MCH 32.3 10/03/2020 1151   MCHC 33.6 10/03/2020 1151   RDW 12.9 10/03/2020 1151   LYMPHSABS 0.2 (L) 10/03/2020 1151   MONOABS 0.6 10/03/2020 1151   EOSABS 0.0 10/03/2020 1151   BASOSABS 0.0 10/03/2020 1151   -------------------------------  Imaging from last 24 hours (if applicable):  Radiology interpretation: DG Abd 2 Views  Result Date: 10/03/2020 CLINICAL DATA:  Abdominal pain, cramping, and nausea and vomiting beginning this morning. EXAM: ABDOMEN - 2 VIEW COMPARISON:  None. FINDINGS: The bowel gas pattern is normal. There is no evidence of free air. No radio-opaque calculi or other significant radiographic abnormality is seen. IMPRESSION: Negative. Electronically Signed   By: Marlaine Hind M.D.   On: 10/03/2020 13:25        This case was discussed Dr. Mickeal Skinner. He expressed agreement with my management of this patient.

## 2020-10-03 NOTE — Progress Notes (Signed)
Pt going to radiation appt after [facility] appt today, radiation aware.  Ok to d/c per PA Lucianne Lei, stable, tolerated infusion well, reports improvement in abdominal cramping.

## 2020-10-03 NOTE — Patient Instructions (Signed)

## 2020-10-04 ENCOUNTER — Ambulatory Visit
Admission: RE | Admit: 2020-10-04 | Discharge: 2020-10-04 | Disposition: A | Discharge status: Home / Self Care | Payer: No Typology Code available for payment source | Source: Ambulatory Visit | Attending: Radiation Oncology | Admitting: Radiation Oncology

## 2020-10-04 DIAGNOSIS — Z833 Family history of diabetes mellitus: Secondary | ICD-10-CM | POA: Diagnosis not present

## 2020-10-04 DIAGNOSIS — Z800 Family history of malignant neoplasm of digestive organs: Secondary | ICD-10-CM | POA: Diagnosis not present

## 2020-10-04 DIAGNOSIS — Z8249 Family history of ischemic heart disease and other diseases of the circulatory system: Secondary | ICD-10-CM | POA: Diagnosis not present

## 2020-10-04 DIAGNOSIS — Z79899 Other long term (current) drug therapy: Secondary | ICD-10-CM | POA: Diagnosis not present

## 2020-10-04 DIAGNOSIS — Z9221 Personal history of antineoplastic chemotherapy: Secondary | ICD-10-CM | POA: Diagnosis not present

## 2020-10-04 DIAGNOSIS — Z87891 Personal history of nicotine dependence: Secondary | ICD-10-CM | POA: Diagnosis not present

## 2020-10-04 DIAGNOSIS — Z510 Encounter for antineoplastic radiation therapy: Secondary | ICD-10-CM | POA: Insufficient documentation

## 2020-10-04 DIAGNOSIS — C711 Malignant neoplasm of frontal lobe: Secondary | ICD-10-CM | POA: Diagnosis present

## 2020-10-05 ENCOUNTER — Other Ambulatory Visit: Payer: Self-pay

## 2020-10-05 ENCOUNTER — Ambulatory Visit
Admission: RE | Admit: 2020-10-05 | Discharge: 2020-10-05 | Disposition: A | Discharge status: Home / Self Care | Payer: No Typology Code available for payment source | Source: Ambulatory Visit | Attending: Radiation Oncology | Admitting: Radiation Oncology

## 2020-10-05 ENCOUNTER — Inpatient Hospital Stay: Payer: No Typology Code available for payment source

## 2020-10-05 ENCOUNTER — Inpatient Hospital Stay: Payer: No Typology Code available for payment source | Attending: Internal Medicine | Admitting: Internal Medicine

## 2020-10-05 VITALS — BP 110/74 | HR 58 | Temp 98.2°F | Resp 18 | Ht 64.0 in | Wt 134.1 lb

## 2020-10-05 DIAGNOSIS — Z79899 Other long term (current) drug therapy: Secondary | ICD-10-CM | POA: Insufficient documentation

## 2020-10-05 DIAGNOSIS — Z800 Family history of malignant neoplasm of digestive organs: Secondary | ICD-10-CM | POA: Insufficient documentation

## 2020-10-05 DIAGNOSIS — C719 Malignant neoplasm of brain, unspecified: Secondary | ICD-10-CM | POA: Diagnosis not present

## 2020-10-05 DIAGNOSIS — Z833 Family history of diabetes mellitus: Secondary | ICD-10-CM | POA: Insufficient documentation

## 2020-10-05 DIAGNOSIS — Z87891 Personal history of nicotine dependence: Secondary | ICD-10-CM | POA: Insufficient documentation

## 2020-10-05 DIAGNOSIS — Z510 Encounter for antineoplastic radiation therapy: Secondary | ICD-10-CM | POA: Diagnosis not present

## 2020-10-05 DIAGNOSIS — C711 Malignant neoplasm of frontal lobe: Secondary | ICD-10-CM | POA: Insufficient documentation

## 2020-10-05 DIAGNOSIS — Z9221 Personal history of antineoplastic chemotherapy: Secondary | ICD-10-CM | POA: Insufficient documentation

## 2020-10-05 DIAGNOSIS — Z8249 Family history of ischemic heart disease and other diseases of the circulatory system: Secondary | ICD-10-CM | POA: Insufficient documentation

## 2020-10-05 LAB — CMP (CANCER CENTER ONLY)
ALT: 16 U/L (ref 0–44)
AST: 15 U/L (ref 15–41)
Albumin: 3.6 g/dL (ref 3.5–5.0)
Alkaline Phosphatase: 37 U/L — ABNORMAL LOW (ref 38–126)
Anion gap: 7 (ref 5–15)
BUN: 9 mg/dL (ref 6–20)
CO2: 28 mmol/L (ref 22–32)
Calcium: 9.3 mg/dL (ref 8.9–10.3)
Chloride: 107 mmol/L (ref 98–111)
Creatinine: 0.84 mg/dL (ref 0.44–1.00)
GFR, Estimated: >60 mL/min (ref >60)
Glucose, Bld: 91 mg/dL (ref 70–99)
Potassium: 3.9 mmol/L (ref 3.5–5.1)
Sodium: 142 mmol/L (ref 135–145)
Total Bilirubin: 0.4 mg/dL (ref 0.3–1.2)
Total Protein: 6 g/dL — ABNORMAL LOW (ref 6.5–8.1)

## 2020-10-05 LAB — CBC WITH DIFFERENTIAL (CANCER CENTER ONLY)
Abs Immature Granulocytes: 0 10*3/uL (ref 0.00–0.07)
Basophils Absolute: 0 10*3/uL (ref 0.0–0.1)
Basophils Relative: 1 %
Eosinophils Absolute: 0.1 10*3/uL (ref 0.0–0.5)
Eosinophils Relative: 4 %
HCT: 38.5 % (ref 36.0–46.0)
Hemoglobin: 12.8 g/dL (ref 12.0–15.0)
Immature Granulocytes: 0 %
Lymphocytes Relative: 8 %
Lymphs Abs: 0.3 10*3/uL — ABNORMAL LOW (ref 0.7–4.0)
MCH: 31.9 pg (ref 26.0–34.0)
MCHC: 33.2 g/dL (ref 30.0–36.0)
MCV: 96 fL (ref 80.0–100.0)
Monocytes Absolute: 0.5 10*3/uL (ref 0.1–1.0)
Monocytes Relative: 14 %
Neutro Abs: 2.6 10*3/uL (ref 1.7–7.7)
Neutrophils Relative %: 73 %
Platelet Count: 184 10*3/uL (ref 150–400)
RBC: 4.01 MIL/uL (ref 3.87–5.11)
RDW: 13 % (ref 11.5–15.5)
WBC Count: 3.6 10*3/uL — ABNORMAL LOW (ref 4.0–10.5)
nRBC: 0 % (ref 0.0–0.2)

## 2020-10-05 NOTE — Progress Notes (Signed)
[place] at [employer], [place] [address] [phone]   Interval Evaluation  Date of Service: 10/05/20 Patient Name: Hannah [name] Patient MRN: [id number] Patient DOB: 1961/10/06 Provider: [name], MD  Identifying Statement:  Hannah [name] is a 59 y.o. female with left frontal glioblastoma   Oncologic History: Oncology History  GBM (glioblastoma multiforme) ([place])   Initial Diagnosis   GBM (glioblastoma multiforme) ([place])   02/17/2020 Surgery   Craniotomy, left frontal resection by Dr. Marcello Moores.  Path is glioblastoma.   08/28/2020 -  Chemotherapy   The patient had temozolomide (TEMODAR) 20 MG capsule, 20 mg (100 % of original dose 20 mg), Oral, Daily, 0 of 1 cycle, Start date: 08/15/2020, End date: -- Dose modification: 20 mg (original dose 20 mg, Cycle 1) temozolomide (TEMODAR) 100 MG capsule, 100 mg (100 % of original dose 100 mg), Oral, Daily, 0 of 1 cycle, Start date: 08/15/2020, End date: -- Dose modification: 100 mg (original dose 100 mg, Cycle 1)  for chemotherapy treatment.      Biomarkers:  MGMT Methylated.  IDH 1/2 Wild type.  EGFR Unknown  TERT Unknown   Interval History:  Hannah [name] presents today after having completed six weeks of IMRT and Temodar.  She has experienced increased nausea this week, but greatly aided by Dr. Satira Sark administering atropine and and sublingual zofran, in addition to IV fluids yesterday.  Able to walk independently although gets a little imbalance at the end of the day at times. No further seizures or acute episodes of dysfunction. Feels most recent batch of Temodar, through different manufacturer, may be to blame for the nausea and mild rash she is experiencing.  H+P (01/20/20) Patient presented one month ago with sudden onset left arm and leg weakness and interrupted speech, [name], noticed poor grip on racket and could not  express herself verbally.  This led to ED visit, stroke eval and CNS imaging which demonstrated a non-enhancing left frontal mass.  At this time she is back to baseline without recurrence of events, taking Keppra 552m twice per day.  No history or seizure or any other neurologic events.  She does describe being sleep deprived prior to seizure event.  She presents today after one month follow up MRI scan.  Medications: Current Outpatient Medications on File Prior to Visit  Medication Sig Dispense Refill  . levETIRAcetam (KEPPRA) 500 MG tablet Take 1 tablet (500 mg total) by mouth 2 (two) times daily. 60 tablet 3  . Multiple Vitamins-Minerals (MULTIVITAL PO) Take 1 tablet by mouth daily.    . ondansetron (ZOFRAN) 8 MG tablet Take 1 tablet (8 mg total) by mouth 2 (two) times daily as needed (nausea and vomiting). May take 30-60 minutes prior to Temodar administration if nausea/vomiting occurs. 30 tablet 1  . temozolomide (TEMODAR) 100 MG capsule Take 1 capsule (100 mg total) by mouth daily. May take on an empty stomach to decrease nausea & vomiting. 42 capsule 0  . temozolomide (TEMODAR) 20 MG capsule Take 1 capsule (20 mg total) by mouth daily. May take on an empty stomach to decrease nausea & vomiting. 42 capsule 0  . LORazepam (ATIVAN) 0.5 MG tablet Place 1 tablet (0.5 mg total) under the tongue every 6 (six) hours as needed for anxiety. (Patient not taking: Reported on 10/05/2020) 45 tablet 0   No current facility-administered medications on file prior to visit.  Allergies: No Known Allergies Past Medical History:  Past Medical History:  Diagnosis Date  . Cervical spondylolysis 12/15/2019   Skeleton: Mild cervical spondylosis C6-7. Incidental find.   . Colon polyp   . Left ankle sprain 10/07/2011  . Seizure ([place])    partial   . Tick bite 02/2020   Past Surgical History:  Past Surgical History:  Procedure Laterality Date  . APPLICATION OF CRANIAL NAVIGATION Left 02/16/2020   Procedure:  APPLICATION OF CRANIAL NAVIGATION;  Surgeon: Vallarie Mare, MD;  Location: [facility];  Service: Neurosurgery;  Laterality: Left;  . CRANIOTOMY Left 02/16/2020   Procedure: LEFT FRONTAL CRANIOTOMY FOR BRAIN TUMOR;  Surgeon: Vallarie Mare, MD;  Location: [facility];  Service: Neurosurgery;  Laterality: Left;  . FOOT SURGERY Left    bone spur - local anesthesia  . TONSILECTOMY/ADENOIDECTOMY WITH MYRINGOTOMY     tonsils and adenoids out only  . WISDOM TOOTH EXTRACTION     Social History:  Social History   Socioeconomic History  . Marital status: Married    Spouse name: Not on file  . Number of children: 3  . Years of education: Not on file  . Highest education level: Not on file  Occupational History  . Not on file  Tobacco Use  . Smoking status: Former Smoker    Years: 2.00    Types: Cigarettes    Quit date: 11/05/1979    Years since quitting: 40.9  . Smokeless tobacco: Never Used  . Tobacco comment: 1/2 pack a week  Vaping Use  . Vaping Use: Never used  Substance and Sexual Activity  . Alcohol use: Yes    Alcohol/week: 0.0 standard drinks    Comment: 1 glass of wine weekly  . Drug use: No  . Sexual activity: Yes    Birth control/protection: Surgical  Other Topics Concern  . Not on file  Social History Narrative  . Not on file   Social Determinants of Health   Financial Resource Strain:   . Difficulty of Paying Living Expenses: Not on file  Food Insecurity:   . Worried About Charity fundraiser in the Last Year: Not on file  . Ran Out of Food in the Last Year: Not on file  Transportation Needs:   . Lack of Transportation (Medical): Not on file  . Lack of Transportation (Non-Medical): Not on file  Physical Activity:   . Days of Exercise per Week: Not on file  . Minutes of Exercise per Session: Not on file  Stress:   . Feeling of Stress : Not on file  Social Connections:   . Frequency of Communication with Friends and Family: Not on file  . Frequency of Social  Gatherings with Friends and Family: Not on file  . Attends Religious Services: Not on file  . Active Member of Clubs or Organizations: Not on file  . Attends Archivist Meetings: Not on file  . Marital Status: Not on file  Intimate Partner Violence:   . Fear of Current or Ex-Partner: Not on file  . Emotionally Abused: Not on file  . Physically Abused: Not on file  . Sexually Abused: Not on file   Family History:  Family History  Problem Relation Age of Onset  . Hypertension Mother   . Stroke Mother   . Diabetes Mother   . Colon cancer Father 21  . Hypertension Sister   . Colon polyps Sister   . Heart disease Brother 42  . Diabetes  Maternal Uncle   . Diabetes Maternal Uncle     Review of Systems: Constitutional: Doesn't report fevers, chills or abnormal weight loss Eyes: Doesn't report blurriness of vision Ears, nose, mouth, throat, and face: Doesn't report sore throat Respiratory: Doesn't report cough, dyspnea or wheezes Cardiovascular: Doesn't report palpitation, chest discomfort  Gastrointestinal:  Doesn't report nausea, constipation, diarrhea GU: Doesn't report incontinence Skin: Doesn't report skin rashes Neurological: Per HPI Musculoskeletal: Doesn't report joint pain Behavioral/Psych: Doesn't report anxiety  Physical Exam: Vitals:   10/05/20 1454  BP: 110/74  Pulse: (!) 58  Resp: 18  Temp: 98.2 F (36.8 C)  SpO2: 100%   KPS: 80. General: Alert, cooperative, pleasant, in no acute distress Head: Normal EENT: No conjunctival injection or scleral icterus.  Lungs: Resp effort normal Cardiac: Regular rate Abdomen: Non-distended abdomen Skin: No rashes cyanosis or petechiae. Extremities: No clubbing or edema  Neurologic Exam: Mental Status: Awake, alert, attentive to examiner. Oriented to self and environment. Language is fluent with intact comprehension.  Occasional interruptions in fluency.  Cranial Nerves: Visual acuity is grossly normal. Visual  fields are full. Extra-ocular movements intact. No ptosis. Face is symmetric Motor: Tone and bulk are normal. Fine motor impairment in right hand. Reflexes are symmetric, no pathologic reflexes present.  Sensory: Intact to light touch Gait: Dystaxic   Labs: I have reviewed the data as listed    Component Value Date/Time   NA 140 10/03/2020 1151   K 3.6 10/03/2020 1151   CL 104 10/03/2020 1151   CO2 24 10/03/2020 1151   GLUCOSE 101 (H) 10/03/2020 1151   GLUCOSE 85 10/13/2006 1158   BUN 12 10/03/2020 1151   CREATININE 0.84 10/03/2020 1151   CALCIUM 10.5 (H) 10/03/2020 1151   PROT 6.8 10/03/2020 1151   ALBUMIN 4.0 10/03/2020 1151   AST 20 10/03/2020 1151   ALT 19 10/03/2020 1151   ALKPHOS 46 10/03/2020 1151   BILITOT 0.9 10/03/2020 1151   GFRNONAA >60 10/03/2020 1151   GFRAA >60 02/17/2020 0136   Lab Results  Component Value Date   WBC 3.6 (L) 10/05/2020   NEUTROABS 2.6 10/05/2020   HGB 12.8 10/05/2020   HCT 38.5 10/05/2020   MCV 96.0 10/05/2020   PLT 184 10/05/2020     Assessment/Plan GBM (glioblastoma multiforme) (HCC) [C71.9]   Hannah [name] is clinically stable today after having completed 6 weeks of IMRT and Temodar.  Labs are within normal limits.  We ultimately recommended competing course of intensity modulated radiation therapy and concurrent daily Temozolomide.  Radiation will be administered Mon-Fri over 6 weeks, Temodar will be dosed at 57m/m2 to be given daily over 42 days.    Chemotherapy should be held for the following:  ANC less than 1,000  Platelets less than 100,000  LFT or creatinine greater than 2x ULN  If clinical concerns/contraindications develop  Has discontinued decadron. Keppra will remain at 5088mBID.  We ask that Hannah [name] return to clinic in 1 months following post-RT brain MRI, or sooner as needed.  All questions were answered. The patient knows to call the clinic with any problems, questions or concerns. No barriers  to learning were detected.  The total time spent in the encounter was 30 minutes and more than 50% was on counseling and review of test results   ZaVentura SellersMD Medical Director of Neuro-Oncology [facility]2/02/21 3:08 PM

## 2020-10-06 ENCOUNTER — Ambulatory Visit
Admission: RE | Admit: 2020-10-06 | Discharge: 2020-10-06 | Disposition: A | Discharge status: Home / Self Care | Payer: No Typology Code available for payment source | Source: Ambulatory Visit | Attending: Radiation Oncology | Admitting: Radiation Oncology

## 2020-10-06 ENCOUNTER — Other Ambulatory Visit: Payer: Self-pay

## 2020-10-06 DIAGNOSIS — Z510 Encounter for antineoplastic radiation therapy: Secondary | ICD-10-CM | POA: Diagnosis not present

## 2020-10-09 ENCOUNTER — Ambulatory Visit
Admission: RE | Admit: 2020-10-09 | Discharge: 2020-10-09 | Disposition: A | Discharge status: Home / Self Care | Payer: No Typology Code available for payment source | Source: Ambulatory Visit | Attending: Radiation Oncology | Admitting: Radiation Oncology

## 2020-10-09 ENCOUNTER — Ambulatory Visit: Payer: No Typology Code available for payment source

## 2020-10-09 ENCOUNTER — Encounter: Payer: Self-pay | Admitting: Radiation Oncology

## 2020-10-09 DIAGNOSIS — Z510 Encounter for antineoplastic radiation therapy: Secondary | ICD-10-CM | POA: Diagnosis not present

## 2020-10-09 MED FILL — levETIRAcetam 500 MG TABS: 500 | 30 days supply | Qty: 60 | Fill #1

## 2020-10-10 ENCOUNTER — Ambulatory Visit: Payer: No Typology Code available for payment source

## 2020-10-10 ENCOUNTER — Other Ambulatory Visit: Payer: Self-pay | Admitting: Radiation Therapy

## 2020-10-17 ENCOUNTER — Other Ambulatory Visit: Payer: Self-pay | Admitting: Internal Medicine

## 2020-10-17 ENCOUNTER — Other Ambulatory Visit: Payer: Self-pay

## 2020-10-17 ENCOUNTER — Inpatient Hospital Stay (HOSPITAL_BASED_OUTPATIENT_CLINIC_OR_DEPARTMENT_OTHER): Payer: No Typology Code available for payment source | Admitting: Internal Medicine

## 2020-10-17 ENCOUNTER — Telehealth: Payer: Self-pay | Admitting: *Deleted

## 2020-10-17 VITALS — BP 116/73 | HR 67 | Temp 98.5°F | Resp 17 | Ht 64.0 in | Wt 131.2 lb

## 2020-10-17 DIAGNOSIS — C719 Malignant neoplasm of brain, unspecified: Secondary | ICD-10-CM | POA: Diagnosis not present

## 2020-10-17 DIAGNOSIS — R569 Unspecified convulsions: Secondary | ICD-10-CM | POA: Diagnosis not present

## 2020-10-17 DIAGNOSIS — Z510 Encounter for antineoplastic radiation therapy: Secondary | ICD-10-CM | POA: Diagnosis not present

## 2020-10-17 MED ORDER — DEXAMETHASONE 4 MG PO TABS: 4.0000 mg | ORAL_TABLET | Freq: Every day | ORAL | 1 refills | Status: DC

## 2020-10-17 NOTE — Progress Notes (Signed)
[place] at [place], [place] [address] [phone]   Interval Evaluation  Date of Service: 10/17/20 Patient Name: Hannah [name] Patient MRN: [id number] Patient DOB: 20-Apr-1961 Provider: [name], MD  Identifying Statement:  Hannah [name] is a 59 y.o. female with left frontal glioblastoma   Oncologic History: Oncology History  GBM (glioblastoma multiforme) ([place])   Initial Diagnosis   GBM (glioblastoma multiforme) ([facility])   02/17/2020 Surgery   Craniotomy, left frontal resection by Dr. Marcello Moores.  Path is glioblastoma.   08/28/2020 -  Chemotherapy   The patient had temozolomide (TEMODAR) 20 MG capsule, 20 mg (100 % of original dose 20 mg), Oral, Daily, 0 of 1 cycle, Start date: 08/15/2020, End date: -- Dose modification: 20 mg (original dose 20 mg, Cycle 1) temozolomide (TEMODAR) 100 MG capsule, 100 mg (100 % of original dose 100 mg), Oral, Daily, 0 of 1 cycle, Start date: 08/15/2020, End date: -- Dose modification: 100 mg (original dose 100 mg, Cycle 1)  for chemotherapy treatment.      Biomarkers:  MGMT Methylated.  IDH 1/2 Wild type.  EGFR Unknown  TERT Unknown   Interval History:  Hannah [name] presents today after recent clinical changes.  She describes ~one week history of progressive weakness, fatigue, imbalance.  She had a fall in her closet last week, and today became light headed and nearly fell in the shower.  She is requiring assistance to get dressed in the morning, and for all other ADLs.  She is no longer independent with gait, requiring assistance from family members the past 2 days.  There have not been any seizures that they can describe.  Plans to leave for [facility] on Friday because of recent family health issue.   H+P (01/20/20) Patient presented one month ago with sudden onset left arm and leg weakness and interrupted speech, [name], noticed poor  grip on racket and could not express herself verbally.  This led to ED visit, stroke eval and CNS imaging which demonstrated a non-enhancing left frontal mass.  At this time she is back to baseline without recurrence of events, taking Keppra 531m twice per day.  No history or seizure or any other neurologic events.  She does describe being sleep deprived prior to seizure event.  She presents today after one month follow up MRI scan.  Medications: Current Outpatient Medications on File Prior to Visit  Medication Sig Dispense Refill  . levETIRAcetam (KEPPRA) 500 MG tablet Take 1 tablet (500 mg total) by mouth 2 (two) times daily. 60 tablet 3  . LORazepam (ATIVAN) 0.5 MG tablet Place 1 tablet (0.5 mg total) under the tongue every 6 (six) hours as needed for anxiety. (Patient not taking: Reported on 10/05/2020) 45 tablet 0  . Multiple Vitamins-Minerals (MULTIVITAL PO) Take 1 tablet by mouth daily.    . ondansetron (ZOFRAN) 8 MG tablet Take 1 tablet (8 mg total) by mouth 2 (two) times daily as needed (nausea and vomiting). May take 30-60 minutes prior to Temodar administration if nausea/vomiting occurs. 30 tablet 1  . temozolomide (TEMODAR) 100 MG capsule Take 1 capsule (100 mg total) by mouth daily. May take on an empty stomach to decrease nausea & vomiting. 42 capsule 0  . temozolomide (TEMODAR) 20 MG capsule Take 1 capsule (20 mg total) by mouth daily. May take on an empty stomach to decrease nausea & vomiting. 42 capsule  0   No current facility-administered medications on file prior to visit.    Allergies: No Known Allergies Past Medical History:  Past Medical History:  Diagnosis Date  . Cervical spondylolysis 12/15/2019   Skeleton: Mild cervical spondylosis C6-7. Incidental find.   . Colon polyp   . Left ankle sprain 10/07/2011  . Seizure ([place])    partial   . Tick bite 02/2020   Past Surgical History:  Past Surgical History:  Procedure Laterality Date  . APPLICATION OF CRANIAL NAVIGATION  Left 02/16/2020   Procedure: APPLICATION OF CRANIAL NAVIGATION;  Surgeon: Vallarie Mare, MD;  Location: [facility];  Service: Neurosurgery;  Laterality: Left;  . CRANIOTOMY Left 02/16/2020   Procedure: LEFT FRONTAL CRANIOTOMY FOR BRAIN TUMOR;  Surgeon: Vallarie Mare, MD;  Location: [facility];  Service: Neurosurgery;  Laterality: Left;  . FOOT SURGERY Left    bone spur - local anesthesia  . TONSILECTOMY/ADENOIDECTOMY WITH MYRINGOTOMY     tonsils and adenoids out only  . WISDOM TOOTH EXTRACTION     Social History:  Social History   Socioeconomic History  . Marital status: Married    Spouse name: Not on file  . Number of children: 3  . Years of education: Not on file  . Highest education level: Not on file  Occupational History  . Not on file  Tobacco Use  . Smoking status: Former Smoker    Years: 2.00    Types: Cigarettes    Quit date: 11/05/1979    Years since quitting: 40.9  . Smokeless tobacco: Never Used  . Tobacco comment: 1/2 pack a week  Vaping Use  . Vaping Use: Never used  Substance and Sexual Activity  . Alcohol use: Yes    Alcohol/week: 0.0 standard drinks    Comment: 1 glass of wine weekly  . Drug use: No  . Sexual activity: Yes    Birth control/protection: Surgical  Other Topics Concern  . Not on file  Social History Narrative  . Not on file   Social Determinants of Health   Financial Resource Strain: Not on file  Food Insecurity: Not on file  Transportation Needs: Not on file  Physical Activity: Not on file  Stress: Not on file  Social Connections: Not on file  Intimate Partner Violence: Not on file   Family History:  Family History  Problem Relation Age of Onset  . Hypertension Mother   . Stroke Mother   . Diabetes Mother   . Colon cancer Father 69  . Hypertension Sister   . Colon polyps Sister   . Heart disease Brother 46  . Diabetes Maternal Uncle   . Diabetes Maternal Uncle     Review of Systems: Constitutional: Doesn't report fevers,  chills or abnormal weight loss Eyes: Doesn't report blurriness of vision Ears, nose, mouth, throat, and face: Doesn't report sore throat Respiratory: Doesn't report cough, dyspnea or wheezes Cardiovascular: Doesn't report palpitation, chest discomfort  Gastrointestinal:  Doesn't report nausea, constipation, diarrhea GU: Doesn't report incontinence Skin: Doesn't report skin rashes Neurological: Per HPI Musculoskeletal: Doesn't report joint pain Behavioral/Psych: Doesn't report anxiety  Physical Exam: Vitals:   10/17/20 1427  BP: 116/73  Pulse: 67  Resp: 17  Temp: 98.5 F (36.9 C)  SpO2: 100%   KPS: 60. General: Alert, cooperative, pleasant, in no acute distress Head: Normal EENT: No conjunctival injection or scleral icterus.  Lungs: Resp effort normal Cardiac: Regular rate Abdomen: Non-distended abdomen Skin: No rashes cyanosis or petechiae. Extremities: No  clubbing or edema  Neurologic Exam: Mental Status: Awake, alert, attentive to examiner. Oriented to self and environment. Language is fluent with intact comprehension.  Occasional interruptions in fluency.  Cranial Nerves: Visual acuity is grossly normal. Visual fields are full. Extra-ocular movements intact. No ptosis. Face is symmetric Motor: Tone and bulk are normal. Fine motor impairment in right hand. Right leg 3/5 proximally, 4/5 distally. Reflexes are symmetric, no pathologic reflexes present.  Sensory: Intact to light touch Gait: Not independent   Labs: I have reviewed the data as listed    Component Value Date/Time   NA 142 10/05/2020 1432   K 3.9 10/05/2020 1432   CL 107 10/05/2020 1432   CO2 28 10/05/2020 1432   GLUCOSE 91 10/05/2020 1432   GLUCOSE 85 10/13/2006 1158   BUN 9 10/05/2020 1432   CREATININE 0.84 10/05/2020 1432   CALCIUM 9.3 10/05/2020 1432   PROT 6.0 (L) 10/05/2020 1432   ALBUMIN 3.6 10/05/2020 1432   AST 15 10/05/2020 1432   ALT 16 10/05/2020 1432   ALKPHOS 37 (L) 10/05/2020 1432    BILITOT 0.4 10/05/2020 1432   GFRNONAA >60 10/05/2020 1432   GFRAA >60 02/17/2020 0136   Lab Results  Component Value Date   WBC 3.6 (L) 10/05/2020   NEUTROABS 2.6 10/05/2020   HGB 12.8 10/05/2020   HCT 38.5 10/05/2020   MCV 96.0 10/05/2020   PLT 184 10/05/2020     Assessment/Plan GBM (glioblastoma multiforme) (HCC) [C71.9]   Hannah [name] demonstrates clinical changes today consistent with left frontal lobe syndrome.  We strongly suspect post-radiation inflammatory process given the timing and focality of the changes.    Recommended re-challenging with dexamethasone 15m BID x3 days.  Will also place into motion referral for home PT and OT given motor and gait impairment.  Keppra will remain at 5037mBID for now.  We will give her a call in 3 days to assess response to this intervention, continue titration.  All questions were answered. The patient knows to call the clinic with any problems, questions or concerns. No barriers to learning were detected.  The total time spent in the encounter was 30 minutes and more than 50% was on counseling and review of test results   ZaVentura SellersMD Medical Director of Neuro-Oncology [facility]2/14/21 2:06 PM

## 2020-10-17 NOTE — Telephone Encounter (Signed)
Husband called concerned about wife.  States she almost blacked out this morning in the shower.  He states he had to physically support her in the shower and getting in and out of bed.  Her balance is off and hasn't fallen yet but is fearful that she might.  Patient was scheduled in office visit to see Dr. Mickeal Skinner today.

## 2020-10-18 ENCOUNTER — Encounter: Payer: Self-pay | Admitting: Internal Medicine

## 2020-10-19 ENCOUNTER — Telehealth: Payer: Self-pay | Admitting: *Deleted

## 2020-10-19 ENCOUNTER — Telehealth: Payer: Self-pay | Admitting: Internal Medicine

## 2020-10-19 NOTE — Telephone Encounter (Signed)
Called [facility] to see if they could provide home health PT/OT services.

## 2020-10-19 NOTE — Telephone Encounter (Signed)
Scheduled per 12/14 los, patient has been called and notified.

## 2020-10-19 NOTE — Telephone Encounter (Signed)
Patients husband called to advise that patient took the 8 mg BID but developed severe jitteriness and refused to continue taking a total of 16 mg in a day.  She decreased the dose to 4 mg BID and so far has tolerated that better.  Husband states that her symptoms have gotten better.    Routed to Dr Mickeal Skinner.  If any changes are warranted we will need to let the patient know.

## 2020-10-20 ENCOUNTER — Other Ambulatory Visit: Payer: Self-pay

## 2020-10-20 ENCOUNTER — Other Ambulatory Visit: Payer: Self-pay | Admitting: *Deleted

## 2020-10-20 ENCOUNTER — Ambulatory Visit (HOSPITAL_BASED_OUTPATIENT_CLINIC_OR_DEPARTMENT_OTHER): Payer: No Typology Code available for payment source | Admitting: Internal Medicine

## 2020-10-20 DIAGNOSIS — G40909 Epilepsy, unspecified, not intractable, without status epilepticus: Secondary | ICD-10-CM

## 2020-10-20 DIAGNOSIS — C719 Malignant neoplasm of brain, unspecified: Secondary | ICD-10-CM | POA: Diagnosis not present

## 2020-10-20 NOTE — Progress Notes (Signed)
I connected with Hannah [name] on 10/20/20 at  3:20 PM EST by telephone visit and verified that I am speaking with the correct person using two identifiers.  I discussed the limitations, risks, security and privacy concerns of performing an evaluation and management service by telemedicine and the availability of in-person appointments. I also discussed with the patient that there may be a patient responsible charge related to this service. The patient expressed understanding and agreed to proceed.  Other persons participating in the visit and their role in the encounter:  n/a  Patient's location:  Home  Provider's location:  Office  Chief Complaint:  GBM (glioblastoma multiforme) ([place])  Seizure disorder ([place])   History of Present Ilness: Hannah [name] describes improvement in gait and cognition since starting the steroids.  The 8mg  dose did lead to side effects, including jittery feeling.  She self decreased down to 4mg  twice per day, which has been better tolerated.  Unfortunately she is dealing with considerable family stress at the moment including sudden and unexpected passing of her father in law yesterday.  She still plans to travel to [place] but not until after the services this weekend. Observations: Language and cognition at baseline Assessment and Plan: GBM (glioblastoma multiforme) ([place])  Seizure disorder ([place])  Recommended decreasing decadron to 4mg  daily once she is returned home from the trip and things have settled at home.  She will call with any other clinical changes.  Follow Up Instructions: RTC on 1/6 as scheduled or sooner if needed  I discussed the assessment and treatment plan with the patient.  The patient was provided an opportunity to ask questions and all were answered.  The patient agreed with the plan and demonstrated understanding of the instructions.    The patient was advised to call back or seek an in-person evaluation if the symptoms worsen or if the  condition fails to improve as anticipated.  I provided 5-10 minutes of non-face-to-face time during this enocunter.  [name], MD   I provided 12 minutes of non face-to-face telephone visit time during this encounter, and > 50% was spent counseling as documented under my assessment & plan.

## 2020-10-24 NOTE — Telephone Encounter (Signed)
[facility] unable to assist in PT/OT.  Will attempt to find services with other agencies.

## 2020-10-26 ENCOUNTER — Telehealth: Payer: Self-pay | Admitting: *Deleted

## 2020-10-26 NOTE — Telephone Encounter (Signed)
[facility] unable to assist with PT/OT.  Will continue to attempt with other agencies.

## 2020-10-30 MED FILL — DEXAMETHASONE 4 MG TABLET: 4 | 60 days supply | Qty: 60 | Fill #0

## 2020-11-02 ENCOUNTER — Ambulatory Visit
Admission: RE | Admit: 2020-11-02 | Discharge: 2020-11-02 | Disposition: A | Discharge status: Home / Self Care | Payer: No Typology Code available for payment source | Source: Ambulatory Visit | Attending: Internal Medicine | Admitting: Internal Medicine

## 2020-11-02 DIAGNOSIS — C719 Malignant neoplasm of brain, unspecified: Secondary | ICD-10-CM

## 2020-11-02 MED ORDER — GADOBENATE DIMEGLUMINE 529 MG/ML IV SOLN
12.0000 mL | Freq: Once | INTRAVENOUS | Status: AC | PRN
  Administered 2020-11-02: 12 mL via INTRAVENOUS

## 2020-11-02 MED FILL — levETIRAcetam 500 MG TABS: 500 | 30 days supply | Qty: 60 | Fill #2

## 2020-11-07 ENCOUNTER — Telehealth: Payer: Self-pay | Admitting: *Deleted

## 2020-11-07 NOTE — Telephone Encounter (Signed)
Received notification from [facility] that they are all unable to accept patient for PT and OT.  Reached out to [facility] pending acceptance and ok with delayed start since we have received no assistance from other agencies.

## 2020-11-09 ENCOUNTER — Inpatient Hospital Stay: Payer: 59 | Attending: Internal Medicine | Admitting: Internal Medicine

## 2020-11-09 ENCOUNTER — Other Ambulatory Visit: Payer: Self-pay | Admitting: Internal Medicine

## 2020-11-09 ENCOUNTER — Other Ambulatory Visit: Payer: Self-pay

## 2020-11-09 ENCOUNTER — Inpatient Hospital Stay: Payer: 59

## 2020-11-09 VITALS — BP 127/80 | HR 55 | Temp 98.4°F | Resp 17 | Ht 64.0 in | Wt 132.6 lb

## 2020-11-09 DIAGNOSIS — Z7952 Long term (current) use of systemic steroids: Secondary | ICD-10-CM | POA: Diagnosis not present

## 2020-11-09 DIAGNOSIS — Z87891 Personal history of nicotine dependence: Secondary | ICD-10-CM | POA: Insufficient documentation

## 2020-11-09 DIAGNOSIS — C711 Malignant neoplasm of frontal lobe: Secondary | ICD-10-CM | POA: Insufficient documentation

## 2020-11-09 DIAGNOSIS — Z833 Family history of diabetes mellitus: Secondary | ICD-10-CM | POA: Diagnosis not present

## 2020-11-09 DIAGNOSIS — C719 Malignant neoplasm of brain, unspecified: Secondary | ICD-10-CM | POA: Diagnosis not present

## 2020-11-09 DIAGNOSIS — Z8249 Family history of ischemic heart disease and other diseases of the circulatory system: Secondary | ICD-10-CM | POA: Diagnosis not present

## 2020-11-09 DIAGNOSIS — Z79899 Other long term (current) drug therapy: Secondary | ICD-10-CM | POA: Insufficient documentation

## 2020-11-09 DIAGNOSIS — Z923 Personal history of irradiation: Secondary | ICD-10-CM | POA: Insufficient documentation

## 2020-11-09 DIAGNOSIS — R569 Unspecified convulsions: Secondary | ICD-10-CM | POA: Diagnosis not present

## 2020-11-09 LAB — CMP (CANCER CENTER ONLY)
ALT: 13 U/L (ref 0–44)
AST: 13 U/L — ABNORMAL LOW (ref 15–41)
Albumin: 3.6 g/dL (ref 3.5–5.0)
Alkaline Phosphatase: 42 U/L (ref 38–126)
Anion gap: 7 (ref 5–15)
BUN: 12 mg/dL (ref 6–20)
CO2: 26 mmol/L (ref 22–32)
Calcium: 9.2 mg/dL (ref 8.9–10.3)
Chloride: 106 mmol/L (ref 98–111)
Creatinine: 0.79 mg/dL (ref 0.44–1.00)
GFR, Estimated: >60 mL/min (ref >60)
Glucose, Bld: 90 mg/dL (ref 70–99)
Potassium: 3.8 mmol/L (ref 3.5–5.1)
Sodium: 139 mmol/L (ref 135–145)
Total Bilirubin: 0.8 mg/dL (ref 0.3–1.2)
Total Protein: 6.4 g/dL — ABNORMAL LOW (ref 6.5–8.1)

## 2020-11-09 LAB — CBC WITH DIFFERENTIAL (CANCER CENTER ONLY)
Abs Immature Granulocytes: 0 10*3/uL (ref 0.00–0.07)
Basophils Absolute: 0 10*3/uL (ref 0.0–0.1)
Basophils Relative: 0 %
Eosinophils Absolute: 0.1 10*3/uL (ref 0.0–0.5)
Eosinophils Relative: 4 %
HCT: 39.2 % (ref 36.0–46.0)
Hemoglobin: 13.3 g/dL (ref 12.0–15.0)
Immature Granulocytes: 0 %
Lymphocytes Relative: 13 %
Lymphs Abs: 0.5 10*3/uL — ABNORMAL LOW (ref 0.7–4.0)
MCH: 32.7 pg (ref 26.0–34.0)
MCHC: 33.9 g/dL (ref 30.0–36.0)
MCV: 96.3 fL (ref 80.0–100.0)
Monocytes Absolute: 0.5 10*3/uL (ref 0.1–1.0)
Monocytes Relative: 13 %
Neutro Abs: 2.6 10*3/uL (ref 1.7–7.7)
Neutrophils Relative %: 70 %
Platelet Count: 165 10*3/uL (ref 150–400)
RBC: 4.07 MIL/uL (ref 3.87–5.11)
RDW: 12.5 % (ref 11.5–15.5)
WBC Count: 3.7 10*3/uL — ABNORMAL LOW (ref 4.0–10.5)
nRBC: 0 % (ref 0.0–0.2)

## 2020-11-09 MED ORDER — DEXAMETHASONE 1 MG PO TABS: 2.0000 mg | ORAL_TABLET | Freq: Every day | ORAL | 1 refills | Status: DC

## 2020-11-09 MED FILL — DEXAMETHASONE 1 MG TABLET: 1 | 30 days supply | Qty: 60 | Fill #0

## 2020-11-09 NOTE — Progress Notes (Signed)
[facility] at [place], [place] [address] [phone]   Interval Evaluation  Date of Service: 11/09/20 Patient Name: Hannah [name] Patient MRN: [id number] Patient DOB: 1961/08/05 Provider: [name], MD  Identifying Statement:  Hannah [name] is a 60 y.o. female with left frontal glioblastoma   Oncologic History: Oncology History  GBM (glioblastoma multiforme) ([place])   Initial Diagnosis   GBM (glioblastoma multiforme) ([place])   02/17/2020 Surgery   Craniotomy, left frontal resection by Dr. Marcello Moores.  Path is glioblastoma.   08/28/2020 -  Chemotherapy   The patient had temozolomide (TEMODAR) 20 MG capsule, 20 mg (100 % of original dose 20 mg), Oral, Daily, 0 of 1 cycle, Start date: 08/15/2020, End date: -- Dose modification: 20 mg (original dose 20 mg, Cycle 1) temozolomide (TEMODAR) 100 MG capsule, 100 mg (100 % of original dose 100 mg), Oral, Daily, 0 of 1 cycle, Start date: 08/15/2020, End date: -- Dose modification: 100 mg (original dose 100 mg, Cycle 1)  for chemotherapy treatment.      Biomarkers:  MGMT Methylated.  IDH 1/2 Wild type.  EGFR Unknown  TERT Unknown   Interval History:  Hannah [name] presents today, now having completed radiation and concurrent Temodar.  She complains of increasing fatigue, and also worsening right sided weakness over the past few days.  She had a brief seizure this past weekend, as well.  Continues to get around the home independently or by holding on to objects or family members.  Decadron was discontinued last Wednesday, had been on 65m daily.  Denies headaches.  H+P (01/20/20) Patient presented one month ago with sudden onset left arm and leg weakness and interrupted speech, [name], [name], stroke eval and CNS imaging which demonstrated a non-enhancing  left frontal mass.  At this time she is back to baseline without recurrence of events, taking Keppra 5053mtwice per day.  No history or seizure or any other neurologic events.  She does describe being sleep deprived prior to seizure event.  She presents today after one month follow up MRI scan.  Medications: Current Outpatient Medications on File Prior to Visit  Medication Sig Dispense Refill  . dexamethasone (DECADRON) 4 MG tablet Take 1 tablet (4 mg total) by mouth daily. 60 tablet 1  . levETIRAcetam (KEPPRA) 500 MG tablet Take 1 tablet (500 mg total) by mouth 2 (two) times daily. 60 tablet 3  . LORazepam (ATIVAN) 0.5 MG tablet Place 1 tablet (0.5 mg total) under the tongue every 6 (six) hours as needed for anxiety. (Patient not taking: No sig reported) 45 tablet 0  . Multiple Vitamins-Minerals (MULTIVITAL PO) Take 1 tablet by mouth daily. (Patient not taking: Reported on 10/17/2020)    . ondansetron (ZOFRAN) 8 MG tablet Take 1 tablet (8 mg total) by mouth 2 (two) times daily as needed (nausea and vomiting). May take 30-60 minutes prior to Temodar administration if nausea/vomiting occurs. (Patient not taking: Reported on 10/17/2020) 30 tablet 1  . temozolomide (TEMODAR) 100 MG capsule Take 1 capsule (100 mg total) by mouth daily. May take on an empty stomach to decrease nausea & vomiting. (Patient not taking: Reported on 10/17/2020) 42 capsule 0  . temozolomide (TEMODAR) 20 MG capsule Take 1 capsule (20 mg total) by mouth daily. May take on an empty  stomach to decrease nausea & vomiting. (Patient not taking: Reported on 10/17/2020) 42 capsule 0   No current facility-administered medications on file prior to visit.    Allergies: No Known Allergies Past Medical History:  Past Medical History:  Diagnosis Date  . Cervical spondylolysis 12/15/2019   Skeleton: Mild cervical spondylosis C6-7. Incidental find.   . Colon polyp   . Left ankle sprain 10/07/2011  . Seizure ([place])    partial   . Tick  bite 02/2020   Past Surgical History:  Past Surgical History:  Procedure Laterality Date  . APPLICATION OF CRANIAL NAVIGATION Left 02/16/2020   Procedure: APPLICATION OF CRANIAL NAVIGATION;  Surgeon: Vallarie Mare, MD;  Location: [facility];  Service: Neurosurgery;  Laterality: Left;  . CRANIOTOMY Left 02/16/2020   Procedure: LEFT FRONTAL CRANIOTOMY FOR BRAIN TUMOR;  Surgeon: Vallarie Mare, MD;  Location: [facility];  Service: Neurosurgery;  Laterality: Left;  . FOOT SURGERY Left    bone spur - local anesthesia  . TONSILECTOMY/ADENOIDECTOMY WITH MYRINGOTOMY     tonsils and adenoids out only  . WISDOM TOOTH EXTRACTION     Social History:  Social History   Socioeconomic History  . Marital status: Married    Spouse name: Not on file  . Number of children: 3  . Years of education: Not on file  . Highest education level: Not on file  Occupational History  . Not on file  Tobacco Use  . Smoking status: Former Smoker    Years: 2.00    Types: Cigarettes    Quit date: 11/05/1979    Years since quitting: 41.0  . Smokeless tobacco: Never Used  . Tobacco comment: 1/2 pack a week  Vaping Use  . Vaping Use: Never used  Substance and Sexual Activity  . Alcohol use: Yes    Alcohol/week: 0.0 standard drinks    Comment: 1 glass of wine weekly  . Drug use: No  . Sexual activity: Yes    Birth control/protection: Surgical  Other Topics Concern  . Not on file  Social History Narrative  . Not on file   Social Determinants of Health   Financial Resource Strain: Not on file  Food Insecurity: Not on file  Transportation Needs: Not on file  Physical Activity: Not on file  Stress: Not on file  Social Connections: Not on file  Intimate Partner Violence: Not on file   Family History:  Family History  Problem Relation Age of Onset  . Hypertension Mother   . Stroke Mother   . Diabetes Mother   . Colon cancer Father 59  . Hypertension Sister   . Colon polyps Sister   . Heart disease  Brother 63  . Diabetes Maternal Uncle   . Diabetes Maternal Uncle     Review of Systems: Constitutional: Doesn't report fevers, chills or abnormal weight loss Eyes: Doesn't report blurriness of vision Ears, nose, mouth, throat, and face: Doesn't report sore throat Respiratory: Doesn't report cough, dyspnea or wheezes Cardiovascular: Doesn't report palpitation, chest discomfort  Gastrointestinal:  Doesn't report nausea, constipation, diarrhea GU: Doesn't report incontinence Skin: Doesn't report skin rashes Neurological: Per HPI Musculoskeletal: Doesn't report joint pain Behavioral/Psych: Doesn't report anxiety  Physical Exam: Vitals:   11/09/20 1256  BP: 127/80  Pulse: (!) 55  Resp: 17  Temp: 98.4 F (36.9 C)  SpO2: 100%   KPS: 60. General: Alert, cooperative, pleasant, in no acute distress Head: Normal EENT: No conjunctival injection or scleral icterus.  Lungs: Resp effort  normal Cardiac: Regular rate Abdomen: Non-distended abdomen Skin: No rashes cyanosis or petechiae. Extremities: No clubbing or edema  Neurologic Exam: Mental Status: Awake, alert, attentive to examiner. Oriented to self and environment. Language is fluent with intact comprehension.  Occasional interruptions in fluency.  Cranial Nerves: Visual acuity is grossly normal. Visual fields are full. Extra-ocular movements intact. No ptosis. Face is symmetric Motor: Tone and bulk are normal. Fine motor impairment in right hand. Right leg 3/5 proximally, 4/5 distally. Reflexes are symmetric, no pathologic reflexes present.  Sensory: Intact to light touch Gait: Not independent   Labs: I have reviewed the data as listed    Component Value Date/Time   NA 142 10/05/2020 1432   K 3.9 10/05/2020 1432   CL 107 10/05/2020 1432   CO2 28 10/05/2020 1432   GLUCOSE 91 10/05/2020 1432   GLUCOSE 85 10/13/2006 1158   BUN 9 10/05/2020 1432   CREATININE 0.84 10/05/2020 1432   CALCIUM 9.3 10/05/2020 1432   PROT 6.0  (L) 10/05/2020 1432   ALBUMIN 3.6 10/05/2020 1432   AST 15 10/05/2020 1432   ALT 16 10/05/2020 1432   ALKPHOS 37 (L) 10/05/2020 1432   BILITOT 0.4 10/05/2020 1432   GFRNONAA >60 10/05/2020 1432   GFRAA >60 02/17/2020 0136   Lab Results  Component Value Date   WBC 3.7 (L) 11/09/2020   NEUTROABS 2.6 11/09/2020   HGB 13.3 11/09/2020   HCT 39.2 11/09/2020   MCV 96.3 11/09/2020   PLT 165 11/09/2020    Imaging:  [facility] Clinician Interpretation: I have personally reviewed the CNS images as listed.  My interpretation, in the context of the patient's clinical presentation, is likely treatment effect  MR BRAIN W WO CONTRAST  Result Date: 11/02/2020 CLINICAL DATA:  Glioblastoma, follow-up EXAM: MRI HEAD WITHOUT AND WITH CONTRAST TECHNIQUE: Multiplanar, multiecho pulse sequences of the brain and surrounding structures were obtained without and with intravenous contrast. CONTRAST:  13m MULTIHANCE GADOBENATE DIMEGLUMINE 529 MG/ML IV SOLN COMPARISON:  08/14/2020 FINDINGS: Brain: Further increase in size of necrotic lesion of the body of the corpus callosum with thick irregular enhancement peripherally. This currently measures 3.1 x 4.3 x 2.3 cm (remeasured on prior as 2.3 x 4.1 x 2.1 cm). More ill-defined enhancement in the more superior parasagittal left frontal lobe extending toward the surgical cavity as seen on coronal series 16, image 16 has decreased. There is susceptibility associated with these areas of enhancement likely reflecting chronic blood products. Extent of abnormal T2 FLAIR hyperintensity in the right greater than left cerebral hemispheres has further increased. There is persistent mass effect on the lateral ventricle bodies. No hydrocephalus. Additional small scattered foci of T2 hyperintensity in the supratentorial white matter nonspecific but could reflect chronic microvascular ischemic changes. Vascular: Major vessel flow voids at the skull base are preserved. Skull and upper cervical  spine: Normal marrow signal is preserved. Left craniotomy. Sinuses/Orbits: Minor mucosal thickening.  Orbits are unremarkable. Other: Sella is unremarkable.  Mastoid air cells are clear. 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 Decreased discontiguous more ill-defined enhancement superiorly extending toward the prior surgical cavity. Increased extent of abnormal T2 FLAIR hyperintensity in right greater than left cerebral hemispheres. May reflect a combination of tumor progression (callosal enhancement) and treatment change (left superior frontal enhancement). Callosal lesion could also reflect treatment change; correlate with radiation field. Electronically Signed   By: PMacy MisM.D.   On: 11/02/2020 11:14    Assessment/Plan GBM (glioblastoma  multiforme) ([place]) [C71.9]   Hannah [name] is clinically stable today, with mainly non-localizing complaints.  Neurologic exam is unchanged or even somewhat improved from prior.    MRI demonstrates modest increase in volume of callosal mass, consistent with likely radiation treatment effect.  Left frontal nodule is stable or diminished.  There is an increase in T2 signal abnormality affecting both frontal lobes.   We recommended initiating treatment with cycle #1 Temozolomide 134m/m2, on for five days and off for twenty three days in twenty eight day cycles. The patient will have a complete blood count performed on days 21 and 28 of each cycle, and a comprehensive metabolic panel performed on day 28 of each cycle. Labs may need to be performed more often. Zofran will prescribed for home use for nausea/vomiting.   Chemotherapy should be held for the following:  ANC less than 1,000  Platelets less than 100,000  LFT or creatinine greater than 2x ULN  If clinical concerns/contraindications develop  Recommended restarting decadron 250mdaily given edema on MRI, clinical symptoms.    Still waiting on  home PT and OT given motor and gait impairment.  Keppra will remain at 50090mID.  We ask that Hannah [name] return to clinic in 4 weeks prior to cycle #2, with labs for evaluation.  All questions were answered. The patient knows to call the clinic with any problems, questions or concerns. No barriers to learning were detected.  The total time spent in the encounter was 40 minutes and more than 50% was on counseling and review of test results   ZacVentura SellersD Medical Director of Neuro-Oncology [facility] [place]/06/22 1:00 PM

## 2020-11-10 ENCOUNTER — Encounter: Payer: Self-pay | Admitting: Internal Medicine

## 2020-11-13 ENCOUNTER — Encounter: Payer: Self-pay | Admitting: Internal Medicine

## 2020-11-14 ENCOUNTER — Encounter: Payer: Self-pay | Admitting: Internal Medicine

## 2020-11-14 ENCOUNTER — Telehealth: Payer: Self-pay | Admitting: *Deleted

## 2020-11-14 MED FILL — ONDANSETRON HCL 8 MG TABLET: 8 | 15 days supply | Qty: 30 | Fill #0

## 2020-11-14 NOTE — Telephone Encounter (Signed)
Dr Carlean Purl,  This pt is a recall colon - lasy 2016 She was diagnosed with Glioblastoma 02-2020- she had surergy 02-17-2020 and has completed radiation but is still getting chemo -  Last ONC note 11-09-2020 states    Oncologic History:    Oncology History  GBM (glioblastoma multiforme) ([place])   Initial Diagnosis   GBM (glioblastoma multiforme) ([place])   02/17/2020 Surgery   Craniotomy, left frontal resection by Dr. Marcello Moores.  Path is glioblastoma.   08/28/2020 -  Chemotherapy   The patient had temozolomide (TEMODAR) 20 MG capsule, 20 mg (100 % of original dose 20 mg), Oral, Daily, 0 of 1 cycle, Start date: 08/15/2020, End date: -- Dose modification: 20 mg (original dose 20 mg, Cycle 1) temozolomide (TEMODAR) 100 MG capsule, 100 mg (100 % of original dose 100 mg), Oral, Daily, 0 of 1 cycle, Start date: 08/15/2020, End date: -- Dose modification: 100 mg (original dose 100 mg, Cycle 1)  for chemotherapy treatment.      Biomarkers:  MGMT Methylated.  IDH 1/2 Wild type.  EGFR Unknown  TERT Unknown   Interval History:  Hannah [name] presents today, now having completed radiation and concurrent Temodar.  She complains of increasing fatigue, and also worsening right sided weakness over the past few days.  She had a brief seizure this past weekend, as well.  Continues to get around the home independently or by holding on to objects or family members.  Decadron was discontinued last Wednesday, had been on 73m daily.  Denies headaches.  Since she had a seizure 11-2020, and this is new , does she need an OV or recall at a later date- Please advise- Thanks MLelan Pons

## 2020-11-14 NOTE — Telephone Encounter (Signed)
I spoke to the Pt- explained to her about the need to cancel PV and colon in Feb 2022 until after she has completed Chemo and is more stable with her seizures per Dr Carlean Purl - pt agreed and verbalized understanding of the plan  for her colon  [id number] recall 12-2021

## 2020-11-14 NOTE — Telephone Encounter (Signed)
Thank you for this inquiry.  She is having seizures still unfortunately and is about to start chemotherapy as you note.  Now is not a good time to perform a screening/surveillance colonoscopy.   When/if she goes into clinical remission we can reconsider.  I am copying her PCP.  Lelan Pons please explain to the patient we do not think it is a good time to go ahead with a colonoscopy because she is starting chemotherapy again.  Thanks

## 2020-11-15 DIAGNOSIS — K5900 Constipation, unspecified: Secondary | ICD-10-CM | POA: Diagnosis not present

## 2020-11-15 DIAGNOSIS — Z7952 Long term (current) use of systemic steroids: Secondary | ICD-10-CM | POA: Diagnosis not present

## 2020-11-15 DIAGNOSIS — G40909 Epilepsy, unspecified, not intractable, without status epilepticus: Secondary | ICD-10-CM | POA: Diagnosis not present

## 2020-11-15 DIAGNOSIS — M47812 Spondylosis without myelopathy or radiculopathy, cervical region: Secondary | ICD-10-CM | POA: Diagnosis not present

## 2020-11-15 DIAGNOSIS — C711 Malignant neoplasm of frontal lobe: Secondary | ICD-10-CM | POA: Diagnosis not present

## 2020-11-16 DIAGNOSIS — K5900 Constipation, unspecified: Secondary | ICD-10-CM | POA: Diagnosis not present

## 2020-11-16 DIAGNOSIS — G40909 Epilepsy, unspecified, not intractable, without status epilepticus: Secondary | ICD-10-CM | POA: Diagnosis not present

## 2020-11-16 DIAGNOSIS — C711 Malignant neoplasm of frontal lobe: Secondary | ICD-10-CM | POA: Diagnosis not present

## 2020-11-16 DIAGNOSIS — Z7952 Long term (current) use of systemic steroids: Secondary | ICD-10-CM | POA: Diagnosis not present

## 2020-11-16 DIAGNOSIS — M47812 Spondylosis without myelopathy or radiculopathy, cervical region: Secondary | ICD-10-CM | POA: Diagnosis not present

## 2020-11-17 ENCOUNTER — Telehealth: Payer: Self-pay

## 2020-11-17 ENCOUNTER — Other Ambulatory Visit (HOSPITAL_BASED_OUTPATIENT_CLINIC_OR_DEPARTMENT_OTHER): Payer: Self-pay

## 2020-11-17 MED FILL — PREVIDENT 5000 1.1% DRY MOU: 1.1 | 30 days supply | Qty: 100 | Fill #0

## 2020-11-17 NOTE — Telephone Encounter (Signed)
I left a detailed message on Hannah [name]'s protected voicemail to give him the verbal orders from Dr. Mickeal Skinner for ADL transfers and exercise 1 time a week for 2 weeks.

## 2020-11-17 NOTE — Telephone Encounter (Signed)
-----   Message from [name], MD sent at 11/17/2020  1:08 PM EST ----- Regarding: RE: Verbal orders yes ----- Message ----- From: Burna Mortimer, CMA Sent: 11/17/2020  12:00 PM EST To: [name], MD Subject: Verbal orders                                  Dr. Loni Beckwith the Occupational therapist with [facility] called requesting verbal orders for 1 time a week for 2 weeks for ADL transfers and exercise. He saw her yesterday for evaluation. Okay for verbal orders?  Myriam Jacobson

## 2020-11-20 ENCOUNTER — Telehealth: Payer: Self-pay | Admitting: Radiation Oncology

## 2020-11-20 NOTE — Telephone Encounter (Signed)
   Radiation Oncology         [phone]) [phone] ________________________________  Name: Hannah [name] MRN: [id number]  Date of Service: 11/22/20  DOB: March 02, 1961  Post Treatment Telephone Note  Diagnosis:   Glioblastoma  Interval Since Last Radiation:  6 weeks   08/28/20-10/09/20: The target in the frontal lobe involving the corpus callosum was treated to 46 Gy in 23 fractions followed by a 14 Gy boost in 7 fractions.  Narrative:  The patient was contacted today for routine follow-up. During treatment she did very well with radiotherapy and did not have significant desquamation. She reports she is still quite fatigued and is unsure if this is from radiation on her disease in general. She does not have follow up MRI scheduled, but is interested in this. She is taking dexamethasone as outlined by Dr. Mickeal Skinner.   Impression/Plan: 1. Glioblastoma. The patient has been doing well since completion of radiotherapy. We discussed that we would be happy to continue to follow her as needed, but she will also continue to follow up with Dr. Mickeal Skinner in neuro oncology.     Carola Rhine, PAC

## 2020-11-22 ENCOUNTER — Encounter: Payer: Self-pay | Admitting: Internal Medicine

## 2020-11-23 DIAGNOSIS — Z7952 Long term (current) use of systemic steroids: Secondary | ICD-10-CM | POA: Diagnosis not present

## 2020-11-23 DIAGNOSIS — G40909 Epilepsy, unspecified, not intractable, without status epilepticus: Secondary | ICD-10-CM | POA: Diagnosis not present

## 2020-11-23 DIAGNOSIS — K5900 Constipation, unspecified: Secondary | ICD-10-CM | POA: Diagnosis not present

## 2020-11-23 DIAGNOSIS — M47812 Spondylosis without myelopathy or radiculopathy, cervical region: Secondary | ICD-10-CM | POA: Diagnosis not present

## 2020-11-23 DIAGNOSIS — C711 Malignant neoplasm of frontal lobe: Secondary | ICD-10-CM | POA: Diagnosis not present

## 2020-11-28 NOTE — Progress Notes (Signed)
  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  The patient then received a 14 Gy boost using a 3-field IMRT technique to yield a final dose of 60 Gy.  Narrative: The patient tolerated radiation treatment relatively well.     Plan: The patient has completed radiation treatment. The patient will return to radiation oncology clinic for routine followup in one month. I advised the patient to call or return sooner if they have any questions or concerns related to their recovery or treatment. ________________________________  Jodelle Gross, M.D., Ph.D.

## 2020-12-01 DIAGNOSIS — G40909 Epilepsy, unspecified, not intractable, without status epilepticus: Secondary | ICD-10-CM | POA: Diagnosis not present

## 2020-12-01 DIAGNOSIS — C711 Malignant neoplasm of frontal lobe: Secondary | ICD-10-CM | POA: Diagnosis not present

## 2020-12-01 DIAGNOSIS — Z7952 Long term (current) use of systemic steroids: Secondary | ICD-10-CM | POA: Diagnosis not present

## 2020-12-01 DIAGNOSIS — M47812 Spondylosis without myelopathy or radiculopathy, cervical region: Secondary | ICD-10-CM | POA: Diagnosis not present

## 2020-12-01 DIAGNOSIS — K5900 Constipation, unspecified: Secondary | ICD-10-CM | POA: Diagnosis not present

## 2020-12-02 DIAGNOSIS — C719 Malignant neoplasm of brain, unspecified: Secondary | ICD-10-CM | POA: Diagnosis not present

## 2020-12-04 MED FILL — levETIRAcetam 500 MG TABS: 500 | 30 days supply | Qty: 60 | Fill #3

## 2020-12-06 DIAGNOSIS — C711 Malignant neoplasm of frontal lobe: Secondary | ICD-10-CM | POA: Diagnosis not present

## 2020-12-06 DIAGNOSIS — M47812 Spondylosis without myelopathy or radiculopathy, cervical region: Secondary | ICD-10-CM | POA: Diagnosis not present

## 2020-12-06 DIAGNOSIS — G40909 Epilepsy, unspecified, not intractable, without status epilepticus: Secondary | ICD-10-CM | POA: Diagnosis not present

## 2020-12-06 DIAGNOSIS — K5900 Constipation, unspecified: Secondary | ICD-10-CM | POA: Diagnosis not present

## 2020-12-06 DIAGNOSIS — Z7952 Long term (current) use of systemic steroids: Secondary | ICD-10-CM | POA: Diagnosis not present

## 2020-12-07 ENCOUNTER — Other Ambulatory Visit: Payer: Self-pay | Admitting: Family Medicine

## 2020-12-07 ENCOUNTER — Telehealth (INDEPENDENT_AMBULATORY_CARE_PROVIDER_SITE_OTHER): Payer: 59 | Admitting: Family Medicine

## 2020-12-07 ENCOUNTER — Encounter: Payer: Self-pay | Admitting: Family Medicine

## 2020-12-07 ENCOUNTER — Other Ambulatory Visit: Payer: Self-pay

## 2020-12-07 ENCOUNTER — Ambulatory Visit
Admission: RE | Admit: 2020-12-07 | Discharge: 2020-12-07 | Disposition: A | Discharge status: Home / Self Care | Payer: 59 | Source: Ambulatory Visit | Attending: Family Medicine | Admitting: Family Medicine

## 2020-12-07 DIAGNOSIS — R0781 Pleurodynia: Secondary | ICD-10-CM

## 2020-12-07 DIAGNOSIS — J029 Acute pharyngitis, unspecified: Secondary | ICD-10-CM | POA: Diagnosis not present

## 2020-12-07 DIAGNOSIS — J9811 Atelectasis: Secondary | ICD-10-CM | POA: Diagnosis not present

## 2020-12-07 LAB — POCT INFLUENZA A/B
Influenza A, POC: NEGATIVE
Influenza B, POC: NEGATIVE

## 2020-12-07 LAB — POCT RAPID STREP A (OFFICE): Rapid Strep A Screen: NEGATIVE

## 2020-12-07 MED ORDER — HYDROCODONE-ACETAMINOPHEN 5-325 MG PO TABS: 1.0000 | ORAL_TABLET | Freq: Four times a day (QID) | ORAL | 0 refills | Status: DC | PRN

## 2020-12-07 MED ORDER — CYCLOBENZAPRINE HCL 10 MG PO TABS
10.0000 mg | ORAL_TABLET | Freq: Three times a day (TID) | ORAL | 0 refills | Status: DC | PRN
Start: 2020-12-07 — End: 2020-12-27

## 2020-12-07 MED FILL — HYDROCODON-APAP 5-325: 5-325 | 5 days supply | Qty: 20 | Fill #0

## 2020-12-07 MED FILL — CYCLOBENZAPRINE HCL 10 MG T: 10 | 10 days supply | Qty: 30 | Fill #0

## 2020-12-07 NOTE — Progress Notes (Signed)
VIRTUAL VISIT VIA VIDEO  I connected with Hannah [name] on 12/07/20 at 10:00 AM EST by elemedicine application and verified that I am speaking with the correct person using two identifiers. Location patient: Home Location provider: [facility], Office Persons participating in the virtual visit: Patient, Dr. Raoul Pitch and Samul Dada, CMA  I discussed the limitations of evaluation and management by telemedicine and the availability of in person appointments. The patient expressed understanding and agreed to proceed.   SUBJECTIVE Chief Complaint  Patient presents with  . Back Pain    Pt c/o left side thoracic pain, pain occurs after sneezing yesterday; pt also states has a sore throat but denies fever;    HPI: Hannah [name] is a 60 y.o. female present for left lateral thoracic pain   below bra-line that started after she sneezed yesterday.  She reports since that time taking deep breaths are rather uncomfortable.  She also endorses a mild sore throat, but denies any fevers or chills.  She is maintaining adequate nutrition and p.o.  She denies cough or shortness of breath.  No sick contacts she is aware of. She has a history of osteopenia from 2018 DEXA scan, she has not had a more recent DEXA scan.  She is currently under brain cancer treatment with chemotherapy.  She is on chronic steroids.  ROS: See pertinent positives and negatives per HPI.  Patient Active Problem List   Diagnosis Date Noted  . 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 GBM (glioblastoma multiforme) ([place])   . Steroid-induced hyperglycemia   . Seizures ([place])   . Leucocytosis   . Bradycardia   . Status post craniotomy 02/16/2020  . Tick bite 02/08/2020  . History of recent hospitalization 12/21/2019  . Seizure disorder ([place]) 12/21/2019  . Hypokalemia 12/21/2019  . Frontal mass of brain 12/16/2019  . Family history of colon cancer - father 59 05/26/2015  . Vitamin D deficiency 03/10/2015    Social History    Tobacco Use  . Smoking status: Former Smoker    Years: 2.00    Types: Cigarettes    Quit date: 11/05/1979    Years since quitting: 41.1  . Smokeless tobacco: Never Used  . Tobacco comment: 1/2 pack a week  Substance Use Topics  . Alcohol use: Yes    Alcohol/week: 0.0 standard drinks    Comment: 1 glass of wine weekly    Current Outpatient Medications:  .  cyclobenzaprine (FLEXERIL) 10 MG tablet, Take 1 tablet (10 mg total) by mouth 3 (three) times daily as needed for muscle spasms., Disp: 30 tablet, Rfl: 0 .  dexamethasone (DECADRON) 1 MG tablet, Take 2 tablets (2 mg total) by mouth daily., Disp: 60 tablet, Rfl: 1 .  LORazepam (ATIVAN) 0.5 MG tablet, Place 1 tablet (0.5 mg total) under the tongue every 6 (six) hours as needed for anxiety., Disp: 45 tablet, Rfl: 0 .  Multiple Vitamins-Minerals (MULTIVITAL PO), Take 1 tablet by mouth daily., Disp: , Rfl:  .  ondansetron (ZOFRAN) 8 MG tablet, Take 1 tablet (8 mg total) by mouth 2 (two) times daily as needed (nausea and vomiting). May take 30-60 minutes prior to Temodar administration if nausea/vomiting occurs., Disp: 30 tablet, Rfl: 1 .  temozolomide (TEMODAR) 100 MG capsule, Take 1 capsule (100 mg total) by mouth daily. May take on an empty stomach to decrease nausea & vomiting., Disp: 42 capsule, Rfl: 0 .  temozolomide (TEMODAR) 20 MG capsule, Take 1 capsule (20 mg total) by  mouth daily. May take on an empty stomach to decrease nausea & vomiting., Disp: 42 capsule, Rfl: 0 .  HYDROcodone-acetaminophen (NORCO/VICODIN) 5-325 MG tablet, Take 1 tablet by mouth every 6 (six) hours as needed., Disp: 20 tablet, Rfl: 0 .  levETIRAcetam (KEPPRA) 500 MG tablet, Take 1 tablet (500 mg total) by mouth 2 (two) times daily. (Patient not taking: Reported on 12/07/2020), Disp: 60 tablet, Rfl: 3  No Known Allergies  OBJECTIVE: LMP 06/30/2015  Gen: No acute distress. Nontoxic in appearance.  HENT: AT. [place].  MMM.  Eyes:Pupils Equal Round Reactive to light,  Extraocular movements intact,  Conjunctiva without redness, discharge or icterus. Chest: Cough not present, not short of breath.   Skin: No reported rashes Neuro:  Alert. Oriented x3  Psych: Normal affect and demeanor. Normal speech. Normal thought content and judgment.  ASSESSMENT AND PLAN: Hannah [name] is a 60 y.o. female present for  Rib pain/Rib pain on left side Possible stress fracture-for pathological fracture of rib with sneezing.  She is on chronic steroids/immunocompromised/receiving brain cancer treatment with a history of osteopenia versus intercostal muscle strain. - DG Ribs Unilateral Left; Future Ice for 72 hours then heat application can be comforting. - cyclobenzaprine (FLEXERIL) 10 MG tablet; Take 1 tablet (10 mg total) by mouth 3 (three) times daily as needed for muscle spasms.  Dispense: 30 tablet; Refill: 0 - HYDROcodone-acetaminophen (NORCO/VICODIN) 5-325 MG tablet; Take 1 tablet by mouth every 6 (six) hours as needed.  Dispense: 20 tablet; Refill: 0 -We stressed to her to monitor for any fever or onset of cough, not just because she has a sore throat but also to prevent onset of pneumonia due to not taking in deep breaths from her rib pain.  Patient reports understanding. -Patient was instructed to take at least 10 deep breaths holding for a few seconds every hour.  Sore throat Rest.  Hydrate.  OTC supportive care. Patient will be set up for testing of flu, rapid strep and Covid.  She is immunocompromised and on chronic steroids and chemotherapy currently for brain tumor.  If Covid is positive would want her to have m-ab infusion treatment or antiviral if possible. - POCT Influenza A/B - POCT rapid strep A - Novel Coronavirus, NAA ([facility]) - Upper Respiratory Culture  Patient will be called with all results and further plan discussed at that time.    Howard Pouch, DO 12/07/2020   No follow-ups on file.  Orders Placed This Encounter  Procedures  . DG Ribs  Unilateral Left   Meds ordered this encounter  Medications  . cyclobenzaprine (FLEXERIL) 10 MG tablet    Sig: Take 1 tablet (10 mg total) by mouth 3 (three) times daily as needed for muscle spasms.    Dispense:  30 tablet    Refill:  0  . DISCONTD: HYDROcodone-acetaminophen (NORCO/VICODIN) 5-325 MG tablet    Sig: Take 1 tablet by mouth every 6 (six) hours as needed.    Dispense:  20 tablet    Refill:  0  . HYDROcodone-acetaminophen (NORCO/VICODIN) 5-325 MG tablet    Sig: Take 1 tablet by mouth every 6 (six) hours as needed.    Dispense:  20 tablet    Refill:  0   Referral Orders  No referral(s) requested today

## 2020-12-07 NOTE — Patient Instructions (Signed)

## 2020-12-08 ENCOUNTER — Other Ambulatory Visit: Payer: Self-pay | Admitting: Family Medicine

## 2020-12-08 ENCOUNTER — Telehealth: Payer: Self-pay | Admitting: Family Medicine

## 2020-12-08 MED ORDER — AZITHROMYCIN 250 MG PO TABS: ORAL_TABLET | ORAL | 0 refills | Status: DC

## 2020-12-08 MED FILL — AZITHROMYCIN 250 MG TABLET: 250 | 5 days supply | Qty: 6 | Fill #0

## 2020-12-08 NOTE — Telephone Encounter (Signed)
Reviewed results and recommendations with patient. She verbalized understanding however sounded groggy on phone so instructions were sent via MyChart message.

## 2020-12-08 NOTE — Telephone Encounter (Signed)
Please inform Hannah [name] her chest her x-ray has evidence of mild atelectasis of her lower left lung.  Atelectasis formation means she is not taking in  deep breaths enough to fully open the little air sacs in that section of the lung.  This is most likely from her being uncomfortable and not taking deep breaths, but can also be early signs of pneumonia or lead to pneumonia. Incidentally, it appears she has an old healed fracture of her left 6th rib-uncertain if she was aware she had ever fractured her rib.  -Instructions: - Every hour take 10 deep breaths.  Hold each deep breath for a few seconds-completely filling up the lungs and holding in the breath.  -Ice over injured area for the first 72 hours after injury, then heat application thereafter can be helpful. -Take the Flexeril at least before bed, if able to tolerate throughout the day, then she can take up to 1/2-1 tab every 8 hours. -Use the Vicodin every 6-12 hours as needed for pain.  This is important to keep the pain controlled so that she can take deep breaths. -Since she is having sneezing, sore throat and her chest x-ray is showing signs of atelectasis-I have called in a Z-Pak for her to start immediately.  She should monitor for any fevers, cough or shortness of breath and if she experiencing these she should be seen emergently.  Her Covid test is still pending at this time.  We will call her with results if we receive them before 5 PM this evening.  If not she can check her MyChart over the weekend for result.  I hope she feels better soon.  We will discuss need for follow-up once we see her Covid results.

## 2020-12-08 NOTE — Telephone Encounter (Deleted)
Please inform Hannah [name] her chest her x-ray has evidence of mild atelectasis of her lower left lung.  Atelectasis formation means she is not taking in  deep breaths enough to fully open the little air sacs in that section of the lung.  This is most likely from her being uncomfortable and not taking deep breaths, but can also be early signs of pneumonia or lead to pneumonia. Incidentally, it appears she has an old healed fracture of her left 6th rib-uncertain if she was aware she had ever fractured her rib.  -Instructions: - Every hour take 10 deep breaths.  Hold each deep breath for a few seconds-completely filling up the lungs and holding in the breath.  -Ice over injured area for the first 72 hours after injury, then heat application thereafter can be helpful. -Take the Flexeril at least before bed, if able to tolerate throughout the day, then she can take up to 1/2-1 tab every 8 hours. -Use the Vicodin every 6-12 hours as needed for pain.  This is important to keep the pain controlled so that she can take deep breaths. -Since she is having sneezing, sore throat and her chest x-ray is showing signs of atelectasis-I have called in a Z-Pak for her to start immediately.  She should monitor for any fevers, cough or shortness of breath and if she experiencing these she should be seen emergently.  Her Covid test is still pending at this time.  We will call her with results if we receive them before 5 PM this evening.  If not she can check her MyChart over the weekend for result.  I hope she feels better soon.  We will discuss need for follow-up once we see her Covid results.   

## 2020-12-09 LAB — CULTURE, UPPER RESPIRATORY
MICRO NUMBER:: 11492936
SPECIMEN QUALITY:: ADEQUATE

## 2020-12-09 LAB — NOVEL CORONAVIRUS, NAA: SARS-CoV-2, NAA: NOT DETECTED

## 2020-12-09 LAB — SARS-COV-2, NAA 2 DAY TAT

## 2020-12-12 ENCOUNTER — Encounter: Payer: No Typology Code available for payment source | Admitting: Internal Medicine

## 2020-12-14 ENCOUNTER — Inpatient Hospital Stay (HOSPITAL_BASED_OUTPATIENT_CLINIC_OR_DEPARTMENT_OTHER): Payer: 59 | Admitting: Internal Medicine

## 2020-12-14 ENCOUNTER — Inpatient Hospital Stay (HOSPITAL_COMMUNITY)
Admission: EM | Admit: 2020-12-14 | Discharge: 2020-12-19 | DRG: 163 | Disposition: A | Discharge status: Home / Self Care | Payer: 59 | Attending: Family Medicine | Admitting: Family Medicine

## 2020-12-14 ENCOUNTER — Emergency Department (HOSPITAL_COMMUNITY): Payer: 59

## 2020-12-14 ENCOUNTER — Other Ambulatory Visit: Payer: Self-pay

## 2020-12-14 ENCOUNTER — Inpatient Hospital Stay: Payer: 59 | Attending: Internal Medicine

## 2020-12-14 ENCOUNTER — Encounter (HOSPITAL_COMMUNITY): Payer: Self-pay

## 2020-12-14 VITALS — BP 119/78 | HR 99 | Temp 97.9°F | Resp 18 | Ht 64.0 in | Wt 128.1 lb

## 2020-12-14 DIAGNOSIS — C711 Malignant neoplasm of frontal lobe: Secondary | ICD-10-CM | POA: Diagnosis present

## 2020-12-14 DIAGNOSIS — Z87891 Personal history of nicotine dependence: Secondary | ICD-10-CM

## 2020-12-14 DIAGNOSIS — Z833 Family history of diabetes mellitus: Secondary | ICD-10-CM | POA: Insufficient documentation

## 2020-12-14 DIAGNOSIS — E876 Hypokalemia: Secondary | ICD-10-CM | POA: Diagnosis not present

## 2020-12-14 DIAGNOSIS — R1310 Dysphagia, unspecified: Secondary | ICD-10-CM | POA: Diagnosis present

## 2020-12-14 DIAGNOSIS — R739 Hyperglycemia, unspecified: Secondary | ICD-10-CM | POA: Diagnosis not present

## 2020-12-14 DIAGNOSIS — R000 Tachycardia, unspecified: Secondary | ICD-10-CM | POA: Diagnosis present

## 2020-12-14 DIAGNOSIS — R569 Unspecified convulsions: Secondary | ICD-10-CM | POA: Diagnosis not present

## 2020-12-14 DIAGNOSIS — I959 Hypotension, unspecified: Secondary | ICD-10-CM | POA: Diagnosis not present

## 2020-12-14 DIAGNOSIS — G40909 Epilepsy, unspecified, not intractable, without status epilepticus: Secondary | ICD-10-CM

## 2020-12-14 DIAGNOSIS — Z66 Do not resuscitate: Secondary | ICD-10-CM | POA: Diagnosis present

## 2020-12-14 DIAGNOSIS — Z8249 Family history of ischemic heart disease and other diseases of the circulatory system: Secondary | ICD-10-CM | POA: Diagnosis not present

## 2020-12-14 DIAGNOSIS — I2699 Other pulmonary embolism without acute cor pulmonale: Secondary | ICD-10-CM | POA: Diagnosis present

## 2020-12-14 DIAGNOSIS — Z79899 Other long term (current) drug therapy: Secondary | ICD-10-CM | POA: Insufficient documentation

## 2020-12-14 DIAGNOSIS — R7401 Elevation of levels of liver transaminase levels: Secondary | ICD-10-CM | POA: Diagnosis present

## 2020-12-14 DIAGNOSIS — Z823 Family history of stroke: Secondary | ICD-10-CM

## 2020-12-14 DIAGNOSIS — T380X5A Adverse effect of glucocorticoids and synthetic analogues, initial encounter: Secondary | ICD-10-CM | POA: Diagnosis present

## 2020-12-14 DIAGNOSIS — E559 Vitamin D deficiency, unspecified: Secondary | ICD-10-CM | POA: Diagnosis not present

## 2020-12-14 DIAGNOSIS — C719 Malignant neoplasm of brain, unspecified: Secondary | ICD-10-CM

## 2020-12-14 DIAGNOSIS — Z20822 Contact with and (suspected) exposure to covid-19: Secondary | ICD-10-CM | POA: Diagnosis present

## 2020-12-14 DIAGNOSIS — Z923 Personal history of irradiation: Secondary | ICD-10-CM | POA: Insufficient documentation

## 2020-12-14 DIAGNOSIS — D630 Anemia in neoplastic disease: Secondary | ICD-10-CM | POA: Diagnosis present

## 2020-12-14 DIAGNOSIS — G4700 Insomnia, unspecified: Secondary | ICD-10-CM | POA: Insufficient documentation

## 2020-12-14 DIAGNOSIS — Z800 Family history of malignant neoplasm of digestive organs: Secondary | ICD-10-CM | POA: Diagnosis not present

## 2020-12-14 DIAGNOSIS — J9811 Atelectasis: Secondary | ICD-10-CM | POA: Diagnosis not present

## 2020-12-14 DIAGNOSIS — J9601 Acute respiratory failure with hypoxia: Secondary | ICD-10-CM | POA: Diagnosis present

## 2020-12-14 DIAGNOSIS — I313 Pericardial effusion (noninflammatory): Secondary | ICD-10-CM | POA: Diagnosis not present

## 2020-12-14 DIAGNOSIS — I50810 Right heart failure, unspecified: Secondary | ICD-10-CM | POA: Diagnosis not present

## 2020-12-14 DIAGNOSIS — I82413 Acute embolism and thrombosis of femoral vein, bilateral: Secondary | ICD-10-CM | POA: Diagnosis not present

## 2020-12-14 DIAGNOSIS — R0602 Shortness of breath: Secondary | ICD-10-CM | POA: Diagnosis not present

## 2020-12-14 DIAGNOSIS — Z7901 Long term (current) use of anticoagulants: Secondary | ICD-10-CM | POA: Insufficient documentation

## 2020-12-14 DIAGNOSIS — Z7952 Long term (current) use of systemic steroids: Secondary | ICD-10-CM | POA: Insufficient documentation

## 2020-12-14 DIAGNOSIS — R112 Nausea with vomiting, unspecified: Secondary | ICD-10-CM

## 2020-12-14 DIAGNOSIS — I2692 Saddle embolus of pulmonary artery without acute cor pulmonale: Secondary | ICD-10-CM | POA: Diagnosis not present

## 2020-12-14 DIAGNOSIS — I2602 Saddle embolus of pulmonary artery with acute cor pulmonale: Principal | ICD-10-CM | POA: Diagnosis present

## 2020-12-14 DIAGNOSIS — Z9889 Other specified postprocedural states: Secondary | ICD-10-CM | POA: Diagnosis not present

## 2020-12-14 DIAGNOSIS — R55 Syncope and collapse: Secondary | ICD-10-CM | POA: Diagnosis not present

## 2020-12-14 DIAGNOSIS — J90 Pleural effusion, not elsewhere classified: Secondary | ICD-10-CM | POA: Diagnosis not present

## 2020-12-14 DIAGNOSIS — E1165 Type 2 diabetes mellitus with hyperglycemia: Secondary | ICD-10-CM | POA: Diagnosis not present

## 2020-12-14 LAB — CMP (CANCER CENTER ONLY)
ALT: 9 U/L (ref 0–44)
AST: 13 U/L — ABNORMAL LOW (ref 15–41)
Albumin: 3.5 g/dL (ref 3.5–5.0)
Alkaline Phosphatase: 62 U/L (ref 38–126)
Anion gap: 11 (ref 5–15)
BUN: 13 mg/dL (ref 6–20)
CO2: 26 mmol/L (ref 22–32)
Calcium: 9.4 mg/dL (ref 8.9–10.3)
Chloride: 104 mmol/L (ref 98–111)
Creatinine: 0.87 mg/dL (ref 0.44–1.00)
GFR, Estimated: >60 mL/min (ref >60)
Glucose, Bld: 118 mg/dL — ABNORMAL HIGH (ref 70–99)
Potassium: 4 mmol/L (ref 3.5–5.1)
Sodium: 141 mmol/L (ref 135–145)
Total Bilirubin: 0.5 mg/dL (ref 0.3–1.2)
Total Protein: 6.8 g/dL (ref 6.5–8.1)

## 2020-12-14 LAB — COMPREHENSIVE METABOLIC PANEL
ALT: 92 U/L — ABNORMAL HIGH (ref 0–44)
AST: 141 U/L — ABNORMAL HIGH (ref 15–41)
Albumin: 3 g/dL — ABNORMAL LOW (ref 3.5–5.0)
Alkaline Phosphatase: 81 U/L (ref 38–126)
Anion gap: 14 (ref 5–15)
BUN: 21 mg/dL — ABNORMAL HIGH (ref 6–20)
CO2: 20 mmol/L — ABNORMAL LOW (ref 22–32)
Calcium: 8.3 mg/dL — ABNORMAL LOW (ref 8.9–10.3)
Chloride: 102 mmol/L (ref 98–111)
Creatinine, Ser: 1.18 mg/dL — ABNORMAL HIGH (ref 0.44–1.00)
GFR, Estimated: 53 mL/min — ABNORMAL LOW (ref >60)
Glucose, Bld: 280 mg/dL — ABNORMAL HIGH (ref 70–99)
Potassium: 4.8 mmol/L (ref 3.5–5.1)
Sodium: 136 mmol/L (ref 135–145)
Total Bilirubin: 0.9 mg/dL (ref 0.3–1.2)
Total Protein: 5.9 g/dL — ABNORMAL LOW (ref 6.5–8.1)

## 2020-12-14 LAB — CBC WITH DIFFERENTIAL/PLATELET
Abs Immature Granulocytes: 0.11 10*3/uL — ABNORMAL HIGH (ref 0.00–0.07)
Basophils Absolute: 0 10*3/uL (ref 0.0–0.1)
Basophils Relative: 0 %
Eosinophils Absolute: 0 10*3/uL (ref 0.0–0.5)
Eosinophils Relative: 0 %
HCT: 36.5 % (ref 36.0–46.0)
Hemoglobin: 12.1 g/dL (ref 12.0–15.0)
Immature Granulocytes: 1 %
Lymphocytes Relative: 5 %
Lymphs Abs: 0.6 10*3/uL — ABNORMAL LOW (ref 0.7–4.0)
MCH: 33.1 pg (ref 26.0–34.0)
MCHC: 33.2 g/dL (ref 30.0–36.0)
MCV: 99.7 fL (ref 80.0–100.0)
Monocytes Absolute: 0.4 10*3/uL (ref 0.1–1.0)
Monocytes Relative: 4 %
Neutro Abs: 10.3 10*3/uL — ABNORMAL HIGH (ref 1.7–7.7)
Neutrophils Relative %: 90 %
Platelets: 135 10*3/uL — ABNORMAL LOW (ref 150–400)
RBC: 3.66 MIL/uL — ABNORMAL LOW (ref 3.87–5.11)
RDW: 12 % (ref 11.5–15.5)
WBC: 11.4 10*3/uL — ABNORMAL HIGH (ref 4.0–10.5)
nRBC: 0 % (ref 0.0–0.2)

## 2020-12-14 LAB — CBC WITH DIFFERENTIAL (CANCER CENTER ONLY)
Abs Immature Granulocytes: 0.06 10*3/uL (ref 0.00–0.07)
Basophils Absolute: 0 10*3/uL (ref 0.0–0.1)
Basophils Relative: 0 %
Eosinophils Absolute: 0 10*3/uL (ref 0.0–0.5)
Eosinophils Relative: 0 %
HCT: 39.5 % (ref 36.0–46.0)
Hemoglobin: 13.7 g/dL (ref 12.0–15.0)
Immature Granulocytes: 1 %
Lymphocytes Relative: 7 %
Lymphs Abs: 0.5 10*3/uL — ABNORMAL LOW (ref 0.7–4.0)
MCH: 32.9 pg (ref 26.0–34.0)
MCHC: 34.7 g/dL (ref 30.0–36.0)
MCV: 94.7 fL (ref 80.0–100.0)
Monocytes Absolute: 0.6 10*3/uL (ref 0.1–1.0)
Monocytes Relative: 7 %
Neutro Abs: 6.6 10*3/uL (ref 1.7–7.7)
Neutrophils Relative %: 85 %
Platelet Count: 137 10*3/uL — ABNORMAL LOW (ref 150–400)
RBC: 4.17 MIL/uL (ref 3.87–5.11)
RDW: 11.9 % (ref 11.5–15.5)
WBC Count: 7.8 10*3/uL (ref 4.0–10.5)
nRBC: 0 % (ref 0.0–0.2)

## 2020-12-14 LAB — PROTIME-INR
INR: 1.3 — ABNORMAL HIGH (ref 0.8–1.2)
Prothrombin Time: 15.3 seconds — ABNORMAL HIGH (ref 11.4–15.2)

## 2020-12-14 LAB — I-STAT CHEM 8, ED
BUN: 21 mg/dL — ABNORMAL HIGH (ref 6–20)
Calcium, Ion: 1.12 mmol/L — ABNORMAL LOW (ref 1.15–1.40)
Chloride: 102 mmol/L (ref 98–111)
Creatinine, Ser: 1 mg/dL (ref 0.44–1.00)
Glucose, Bld: 268 mg/dL — ABNORMAL HIGH (ref 70–99)
HCT: 35 % — ABNORMAL LOW (ref 36.0–46.0)
Hemoglobin: 11.9 g/dL — ABNORMAL LOW (ref 12.0–15.0)
Potassium: 4.8 mmol/L (ref 3.5–5.1)
Sodium: 135 mmol/L (ref 135–145)
TCO2: 20 mmol/L — ABNORMAL LOW (ref 22–32)

## 2020-12-14 LAB — APTT: aPTT: 35 seconds (ref 24–36)

## 2020-12-14 LAB — TROPONIN I (HIGH SENSITIVITY): Troponin I (High Sensitivity): 149 ng/L (ref <18)

## 2020-12-14 LAB — MAGNESIUM: Magnesium: 2.2 mg/dL (ref 1.7–2.4)

## 2020-12-14 MED ORDER — HEPARIN BOLUS VIA INFUSION
4000.0000 [IU] | Freq: Once | INTRAVENOUS | Status: AC
  Administered 2020-12-14: 4000 [IU] via INTRAVENOUS
  Filled 2020-12-14: qty 4000

## 2020-12-14 MED ORDER — HEPARIN (PORCINE) 25000 UT/250ML-% IV SOLN
1000.0000 [IU]/h | INTRAVENOUS | Status: DC
  Administered 2020-12-14: 1000 [IU]/h via INTRAVENOUS
  Filled 2020-12-14: qty 250

## 2020-12-14 MED ORDER — IOHEXOL 350 MG/ML SOLN
100.0000 mL | Freq: Once | INTRAVENOUS | Status: AC | PRN
  Administered 2020-12-14: 100 mL via INTRAVENOUS

## 2020-12-14 MED ORDER — SODIUM CHLORIDE 0.9 % IV BOLUS
1000.0000 mL | Freq: Once | INTRAVENOUS | Status: AC
  Administered 2020-12-14: 1000 mL via INTRAVENOUS

## 2020-12-14 NOTE — Progress Notes (Signed)
[facility] [place], [place] [address] [phone]   Interval Evaluation  Date of Service: 12/14/20 Patient Name: Hannah [name] Patient MRN: [id number] Patient DOB: 1961-10-30 Provider: [name], MD  Identifying Statement:  Hannah [name] is a 60 y.o. female with left frontal glioblastoma   Oncologic History: Oncology History  GBM (glioblastoma multiforme) ([facility])   Initial Diagnosis   GBM (glioblastoma multiforme) ([facility])   02/17/2020 Surgery   Craniotomy, left frontal resection by Dr. Marcello Moores.  Path is glioblastoma.   08/28/2020 - 10/09/2020 Radiation Therapy   Radiation and concurrent Temozolomide 29m/m2 daily     Biomarkers:  MGMT Methylated.  IDH 1/2 Wild type.  EGFR Unknown  TERT Unknown   Interval History:  Hannah [name] presents today, now having completed 1st cycle of adjuvant Temodar.  She describes overall decline in function, described as worsening right arm and leg weakness, as well as generalized weakness, fatigue, and "shakiness".  She is now unable to stand at home and is fully reliant on others for ADLs.  Decadron has been at 241mdaily.  Decadron 11/09/20: 73m66m2/10/22: 8mg73m+P (01/20/20) Patient presented one month ago with sudden onset left arm and leg weakness and interrupted speech, [name], [name], stroke eval and CNS imaging which demonstrated a non-enhancing left frontal mass.  At this time she is back to baseline without recurrence of events, taking Keppra 500mg84mce per day.  No history or seizure or any other neurologic events.  She does describe being sleep deprived prior to seizure event.  She presents today after one month follow up MRI scan.  Medications: Current Outpatient Medications on File Prior to Visit  Medication Sig Dispense Refill  .  cyclobenzaprine (FLEXERIL) 10 MG tablet Take 1 tablet (10 mg total) by mouth 3 (three) times daily as needed for muscle spasms. 30 tablet 0  . dexamethasone (DECADRON) 1 MG tablet Take 2 tablets (2 mg total) by mouth daily. 60 tablet 1  . HYDROcodone-acetaminophen (NORCO/VICODIN) 5-325 MG tablet Take 1 tablet by mouth every 6 (six) hours as needed. 20 tablet 0  . levETIRAcetam (KEPPRA) 500 MG tablet Take 1 tablet (500 mg total) by mouth 2 (two) times daily. 60 tablet 3  . Multiple Vitamins-Minerals (MULTIVITAL PO) Take 1 tablet by mouth daily.    . LORMarland Kitchenzepam (ATIVAN) 0.5 MG tablet Place 1 tablet (0.5 mg total) under the tongue every 6 (six) hours as needed for anxiety. (Patient not taking: Reported on 12/14/2020) 45 tablet 0  . ondansetron (ZOFRAN) 8 MG tablet Take 1 tablet (8 mg total) by mouth 2 (two) times daily as needed (nausea and vomiting). May take 30-60 minutes prior to Temodar administration if nausea/vomiting occurs. (Patient not taking: Reported on 12/14/2020) 30 tablet 1  . temozolomide (TEMODAR) 100 MG capsule Take 1 capsule (100 mg total) by mouth daily. May take on an empty stomach to decrease nausea & vomiting. (Patient not taking: Reported on 12/14/2020) 42 capsule 0  . temozolomide (TEMODAR) 20 MG capsule Take 1 capsule (20 mg total) by mouth daily. May take on an empty stomach to decrease nausea & vomiting. (Patient not taking: Reported on 12/14/2020) 42 capsule 0   No current facility-administered medications on file prior to visit.    Allergies: No Known Allergies Past Medical History:  Past Medical History:  Diagnosis Date  . Cervical spondylolysis 12/15/2019   Skeleton: Mild cervical spondylosis C6-7. Incidental find.   . Colon polyp   . GBM (glioblastoma multiforme) ([facility])   . Left ankle sprain 10/07/2011  . Seizure ([facility])    partial   . Tick bite 02/2020   Past Surgical History:  Past Surgical History:  Procedure Laterality Date  . APPLICATION OF CRANIAL NAVIGATION  Left 02/16/2020   Procedure: APPLICATION OF CRANIAL NAVIGATION;  Surgeon: Vallarie Mare, MD;  Location: [facility];  Service: Neurosurgery;  Laterality: Left;  . CRANIOTOMY Left 02/16/2020   Procedure: LEFT FRONTAL CRANIOTOMY FOR BRAIN TUMOR;  Surgeon: Vallarie Mare, MD;  Location: [facility];  Service: Neurosurgery;  Laterality: Left;  . FOOT SURGERY Left    bone spur - local anesthesia  . TONSILECTOMY/ADENOIDECTOMY WITH MYRINGOTOMY     tonsils and adenoids out only  . WISDOM TOOTH EXTRACTION     Social History:  Social History   Socioeconomic History  . Marital status: Married    Spouse name: Not on file  . Number of children: 3  . Years of education: Not on file  . Highest education level: Not on file  Occupational History  . Not on file  Tobacco Use  . Smoking status: Former Smoker    Years: 2.00    Types: Cigarettes    Quit date: 11/05/1979    Years since quitting: 41.1  . Smokeless tobacco: Never Used  . Tobacco comment: 1/2 pack a week  Vaping Use  . Vaping Use: Never used  Substance and Sexual Activity  . Alcohol use: Yes    Alcohol/week: 0.0 standard drinks    Comment: 1 glass of wine weekly  . Drug use: No  . Sexual activity: Yes    Birth control/protection: Surgical  Other Topics Concern  . Not on file  Social History Narrative  . Not on file   Social Determinants of Health   Financial Resource Strain: Not on file  Food Insecurity: Not on file  Transportation Needs: Not on file  Physical Activity: Not on file  Stress: Not on file  Social Connections: Not on file  Intimate Partner Violence: Not on file   Family History:  Family History  Problem Relation Age of Onset  . Hypertension Mother   . Stroke Mother   . Diabetes Mother   . Colon cancer Father 61  . Hypertension Sister   . Colon polyps Sister   . Heart disease Brother 75  . Diabetes Maternal Uncle   . Diabetes Maternal Uncle     Review of Systems: Constitutional: Doesn't report fevers,  chills or abnormal weight loss Eyes: Doesn't report blurriness of vision Ears, nose, mouth, throat, and face: Doesn't report sore throat Respiratory: Chest wall pain Cardiovascular: Doesn't report palpitation, chest discomfort  Gastrointestinal:  Doesn't report nausea, constipation, diarrhea GU: Doesn't report incontinence Skin: Doesn't report skin rashes Neurological: Per HPI Musculoskeletal: Doesn't report joint pain Behavioral/Psych: Doesn't report anxiety  Physical Exam: Vitals:   12/14/20 1227  BP: 119/78  Pulse: 99  Resp: 18  Temp: 97.9 F (36.6 C)  SpO2: 92%   KPS: 60. General: Alert, cooperative, pleasant, in no acute distress Head: Normal EENT: No conjunctival injection or scleral icterus.  Lungs: Resp effort normal Cardiac: Regular rate Abdomen: Non-distended abdomen Skin: No rashes cyanosis or petechiae. Extremities: No clubbing or edema  Neurologic Exam: Mental Status: Awake, alert, attentive to examiner. Oriented to self and environment. Language  is fluent with intact comprehension.  Occasional interruptions in fluency.  Cranial Nerves: Visual acuity is grossly normal. Visual fields are full. Extra-ocular movements intact. No ptosis. Face is symmetric Motor: Tone and bulk are normal. Fine motor impairment in right hand, drift of right arm noted. Right leg 2/5 proximally, 3/5 distally. Reflexes are symmetric, no pathologic reflexes present.  Sensory: Intact to light touch Gait: Not independent   Labs: I have reviewed the data as listed    Component Value Date/Time   NA 141 12/14/2020 1207   K 4.0 12/14/2020 1207   CL 104 12/14/2020 1207   CO2 26 12/14/2020 1207   GLUCOSE 118 (H) 12/14/2020 1207   GLUCOSE 85 10/13/2006 1158   BUN 13 12/14/2020 1207   CREATININE 0.87 12/14/2020 1207   CALCIUM 9.4 12/14/2020 1207   PROT 6.8 12/14/2020 1207   ALBUMIN 3.5 12/14/2020 1207   AST 13 (L) 12/14/2020 1207   ALT 9 12/14/2020 1207   ALKPHOS 62 12/14/2020 1207    BILITOT 0.5 12/14/2020 1207   GFRNONAA >60 12/14/2020 1207   GFRAA >60 02/17/2020 0136   Lab Results  Component Value Date   WBC 7.8 12/14/2020   NEUTROABS 6.6 12/14/2020   HGB 13.7 12/14/2020   HCT 39.5 12/14/2020   MCV 94.7 12/14/2020   PLT 137 (L) 12/14/2020    Assessment/Plan GBM (glioblastoma multiforme) ([facility]) [C71.9]   Hannah [name] is clinically progressive today, now having completed first cycle of adjuvant Temodar, with localizing and non-localizing complaints.   Given likelihood of undertreatment of cerebral edema, we recommended immediate initiation of higher dose dexamethasone, 55m daily x5 days.       Once that is complete, we recommended initiating treatment with cycle #2 Temozolomide 1524mm2, on for five days and off for twenty three days in twenty eight day cycles. The patient will have a complete blood count performed on days 21 and 28 of each cycle, and a comprehensive metabolic panel performed on day 28 of each cycle. Labs may need to be performed more often. Zofran will prescribed for home use for nausea/vomiting.   Chemotherapy should be held for the following:  ANC less than 1,000  Platelets less than 100,000  LFT or creatinine greater than 2x ULN  If clinical concerns/contraindications develop  Start of cycle #2 will be deferred until response assessment to higher dose dexamethasone.  We will give her a call in 5 days to characterize clinical response.  Keppra will remain at 50070mID.  We ask that Hannah [name] return to clinic in 1 month with MRI brain for evaluation, prior to cycle #3.  All questions were answered. The patient knows to call the clinic with any problems, questions or concerns. No barriers to learning were detected.  The total time spent in the encounter was 40 minutes and more than 50% was on counseling and review of test results   ZacVentura SellersD Medical Director of Neuro-Oncology [facility]  [place]/10/22 12:56 PM

## 2020-12-14 NOTE — ED Provider Notes (Signed)
[facility] DEPT Provider Note   CSN: [id number] Arrival date & time: 12/14/20  2021     History Chief Complaint  Patient presents with  . Seizures    Ariele A Fedor is a 60 y.o. female.  The history is provided by the patient and medical records.   Keerthi A Sousa is a 60 y.o. female who presents to the Emergency Department complaining of possible seizure. History is provided by EMS and the patient. She presents the emergency department by EMS for evaluation following possible seizure. Per report she has a history of partial seizures and she had an episode that occurred at home that her husband was concerned for a generalized seizure. Patient reports that she passed out because she was very short of breath going up the stairs. She complains of severe shortness of breath. She reports left-sided back pain that started one week ago. 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 She denies any fevers, chest pain, nausea, vomiting, abdominal pain.    Past Medical History:  Diagnosis Date  . Cervical spondylolysis 12/15/2019   Skeleton: Mild cervical spondylosis C6-7. Incidental find.   . Colon polyp   . GBM (glioblastoma multiforme) ([place])   . Left ankle sprain 10/07/2011  . Seizure ([place])    partial   . Tick bite 02/2020    Patient Active Problem List   Diagnosis Date Noted  . 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 GBM (glioblastoma multiforme) ([place])   . Steroid-induced hyperglycemia   . Seizures ([place])   . Leucocytosis   . Bradycardia   . Status post craniotomy 02/16/2020  . Tick bite 02/08/2020  . History of recent hospitalization 12/21/2019  . Seizure disorder ([place]) 12/21/2019  . Hypokalemia 12/21/2019  . Frontal mass of brain 12/16/2019  . Family history of colon cancer - father 44 05/26/2015  . Vitamin D deficiency 03/10/2015    Past Surgical History:  Procedure  Laterality Date  . APPLICATION OF CRANIAL NAVIGATION Left 02/16/2020   Procedure: APPLICATION OF CRANIAL NAVIGATION;  Surgeon: Vallarie Mare, MD;  Location: [facility];  Service: Neurosurgery;  Laterality: Left;  . CRANIOTOMY Left 02/16/2020   Procedure: LEFT FRONTAL CRANIOTOMY FOR BRAIN TUMOR;  Surgeon: Vallarie Mare, MD;  Location: [facility];  Service: Neurosurgery;  Laterality: Left;  . FOOT SURGERY Left    bone spur - local anesthesia  . TONSILECTOMY/ADENOIDECTOMY WITH MYRINGOTOMY     tonsils and adenoids out only  . WISDOM TOOTH EXTRACTION       OB History   No obstetric history on file.     Family History  Problem Relation Age of Onset  . Hypertension Mother   . Stroke Mother   . Diabetes Mother   . Colon cancer Father 8  . Hypertension Sister   . Colon polyps Sister   . Heart disease Brother 66  . Diabetes Maternal Uncle   . Diabetes Maternal Uncle     Social History   Tobacco Use  . Smoking status: Former Smoker    Years: 2.00    Types: Cigarettes    Quit date: 11/05/1979    Years since quitting: 41.1  . Smokeless tobacco: Never Used  . Tobacco comment: 1/2 pack a week  Vaping Use  . Vaping Use: Never used  Substance Use Topics  . Alcohol use: Yes    Alcohol/week: 0.0 standard drinks    Comment: 1  glass of wine weekly  . Drug use: No    Home Medications Prior to Admission medications   Medication Sig Start Date End Date Taking? Authorizing Provider  cholecalciferol (VITAMIN D3) 25 MCG (1000 UNIT) tablet Take 1,000 Units by mouth daily.   Yes [provider]  cyclobenzaprine (FLEXERIL) 10 MG tablet Take 1 tablet (10 mg total) by mouth 3 (three) times daily as needed for muscle spasms. 12/07/20  Yes Kuneff, Renee A, DO  dexamethasone (DECADRON) 1 MG tablet Take 2 tablets (2 mg total) by mouth daily. Patient taking differently: Take 8 mg by mouth daily. 11/09/20  Yes Vaslow, Acey Lav, MD  docusate sodium (COLACE) 100 MG capsule Take 100 mg by mouth 2  (two) times daily as needed for mild constipation.   Yes [provider]  HYDROcodone-acetaminophen (NORCO/VICODIN) 5-325 MG tablet Take 1 tablet by mouth every 6 (six) hours as needed. 12/07/20  Yes Kuneff, Renee A, DO  levETIRAcetam (KEPPRA) 500 MG tablet Take 1 tablet (500 mg total) by mouth 2 (two) times daily. 09/07/20  Yes Vaslow, Acey Lav, MD  LORazepam (ATIVAN) 0.5 MG tablet Place 1 tablet (0.5 mg total) under the tongue every 6 (six) hours as needed for anxiety. Patient not taking: No sig reported 10/03/20   Sandi Mealy E., PA-C  ondansetron (ZOFRAN) 8 MG tablet Take 1 tablet (8 mg total) by mouth 2 (two) times daily as needed (nausea and vomiting). May take 30-60 minutes prior to Temodar administration if nausea/vomiting occurs. Patient not taking: No sig reported 08/15/20   [name], MD  temozolomide (TEMODAR) 100 MG capsule Take 1 capsule (100 mg total) by mouth daily. May take on an empty stomach to decrease nausea & vomiting. 08/15/20   [name], MD  temozolomide (TEMODAR) 20 MG capsule Take 1 capsule (20 mg total) by mouth daily. May take on an empty stomach to decrease nausea & vomiting. 08/15/20   [name], MD    Allergies    Patient has no known allergies.  Review of Systems   Review of Systems  All other systems reviewed and are negative.   Physical Exam Updated Vital Signs BP 104/80   Pulse (!) 135   Temp 99.4 F (37.4 C) (Rectal)   Resp (!) 23   Ht 5\' 4"  (1.626 m)   Wt 58 kg   LMP 06/30/2015   SpO2 93%   BMI 21.95 kg/m   Physical Exam Vitals and nursing note reviewed.  Constitutional:      General: She is in acute distress.     Appearance: She is well-developed and well-nourished. She is ill-appearing.  HENT:     Head: Normocephalic and atraumatic.  Cardiovascular:     Rate and Rhythm: Normal rate and regular rhythm.     Heart sounds: No murmur heard.   Pulmonary:     Effort: Pulmonary effort is normal. No  respiratory distress.     Breath sounds: Normal breath sounds.  Abdominal:     Palpations: Abdomen is soft.     Tenderness: There is no abdominal tenderness. There is no guarding or rebound.  Musculoskeletal:        General: No tenderness or edema.  Skin:    General: Skin is dry.     Coloration: Skin is pale.     Comments: Cool skin  Neurological:     Mental Status: She is alert and oriented to person, place, and time.     Comments: Profound generalized  weakness  Psychiatric:        Mood and Affect: Mood and affect normal.        Behavior: Behavior normal.     ED Results / Procedures / Treatments   Labs (all labs ordered are listed, but only abnormal results are displayed) Labs Reviewed  COMPREHENSIVE METABOLIC PANEL - Abnormal; Notable for the following components:      Result Value   CO2 20 (*)    Glucose, Bld 280 (*)    BUN 21 (*)    Creatinine, Ser 1.18 (*)    Calcium 8.3 (*)    Total Protein 5.9 (*)    Albumin 3.0 (*)    AST 141 (*)    ALT 92 (*)    GFR, Estimated 53 (*)    All other components within normal limits  CBC WITH DIFFERENTIAL/PLATELET - Abnormal; Notable for the following components:   WBC 11.4 (*)    RBC 3.66 (*)    Platelets 135 (*)    Neutro Abs 10.3 (*)    Lymphs Abs 0.6 (*)    Abs Immature Granulocytes 0.11 (*)    All other components within normal limits  PROTIME-INR - Abnormal; Notable for the following components:   Prothrombin Time 15.3 (*)    INR 1.3 (*)    All other components within normal limits  I-STAT CHEM 8, ED - Abnormal; Notable for the following components:   BUN 21 (*)    Glucose, Bld 268 (*)    Calcium, Ion 1.12 (*)    TCO2 20 (*)    Hemoglobin 11.9 (*)    HCT 35.0 (*)    All other components within normal limits  TROPONIN I (HIGH SENSITIVITY) - Abnormal; Notable for the following components:   Troponin I (High Sensitivity) 149 (*)    All other components within normal limits  RESP PANEL BY RT-PCR (FLU A&B, COVID)  ARPGX2  MAGNESIUM  APTT  CBG MONITORING, ED  TROPONIN I (HIGH SENSITIVITY)    EKG EKG Interpretation  Date/Time:  Thursday December 14 2020 20:50:44 EST Ventricular Rate:  135 PR Interval:    QRS Duration: 111 QT Interval:  303 QTC Calculation: 455 R Axis:   -175 Text Interpretation: Sinus tachycardia Right axis deviation Low voltage, precordial leads Nonspecific T abnormalities, anterior leads Confirmed by Quintella Reichert [phone]) on 12/14/2020 9:00:38 PM   Radiology CT Head Wo Contrast  Result Date: 12/14/2020 CLINICAL DATA:  60 year old female with syncope, seizure, and abnormal neurological exam. Known glioblastoma undergoing chemo and radiation treatment. EXAM: CT HEAD WITHOUT CONTRAST TECHNIQUE: Contiguous axial images were obtained from the base of the skull through the vertex without intravenous contrast. COMPARISON:  Brain MRI dated 11/02/2020. FINDINGS: Brain: Ill-defined 3.8 x 2.3 cm low attenuating area centered at anterior corpus callosum and crossing the midline correspond to the necrotic mass seen on the prior MRI. There is interval development of moderate amount of white matter edema predominantly involving the frontal lobes, increased since the prior MRI. There is approximately 4 mm right to left midline shift (measured at the level of the foramen Monro). Ill-defined area of higher attenuation involving the left frontal convexity (23/2) may represent volume averaging from adjacent cortices or additional mass. MRI without and with contrast is recommended for better evaluation of the mass and comparison with prior MRI images. There is no acute intracranial hemorrhage. Vascular: No hyperdense vessel or unexpected calcification. Skull: Left frontal craniotomy.  No acute calvarial pathology. Sinuses/Orbits: No acute finding. Other:  None IMPRESSION: 1. Increase in the degree of edema with 4 mm right to left midline shift. MRI without and with contrast may provide better evaluation. 2.  No acute intracranial hemorrhage. These results were called by telephone at the time of interpretation on 12/14/2020 at 9:42 pm to provider [facility] , who verbally acknowledged these results. Electronically Signed   By: Anner Crete M.D.   On: 12/14/2020 21:49   CT Angio Chest PE W/Cm &/Or Wo Cm  Result Date: 12/14/2020 CLINICAL DATA:  History of brain tumor shortness of breath EXAM: CT ANGIOGRAPHY CHEST WITH CONTRAST TECHNIQUE: Multidetector CT imaging of the chest was performed using the standard protocol during bolus administration of intravenous contrast. Multiplanar CT image reconstructions and MIPs were obtained to evaluate the vascular anatomy. CONTRAST:  166mL OMNIPAQUE IOHEXOL 350 MG/ML SOLN COMPARISON:  None. FINDINGS: Cardiovascular: There is a optimal opacification of the pulmonary arteries. There is partially occlusive thrombus within the central central bifurcation of the main pulmonary arteries with nearly occlusive thrombus in the bilateral segmental and subsegmental arterial branches throughout both lungs. There is evidence of right ventricular heart strain, RV/LV ratio equals 2.1 there is a small pericardial effusion present. There is normal three-vessel brachiocephalic anatomy without proximal stenosis. Scattered mild aortic atherosclerosis is seen. Mediastinum/Nodes: No hilar, mediastinal, or axillary adenopathy. Thyroid gland, trachea, and esophagus demonstrate no significant findings. Lungs/Pleura: A small left pleural effusion with adjacent patchy airspace opacity at the left lung base, likely atelectasis is noted. Upper Abdomen: No acute abnormalities present in the visualized portions of the upper abdomen. Hree focus of contrast is seen within the IVC, likely consistent with right heart strain. Musculoskeletal: No chest wall abnormality. No acute or significant osseous findings. Review of the MIP images confirms the above findings. IMPRESSION: Partially occlusive saddle emboli  with nearly occlusive thrombus within both lungs throughout the segmental and subsegmental arterial branches. CTevidence of right heart strain (RV/LV Ratio = 2.1) consistent with at least submassive (intermediate risk) PE. The presence of right heart strain has been associated with an increased risk of morbidity and mortality. Small pericardial effusion Small left pleural effusion with basilar atelectasis These results were called by telephone at the time of interpretation on 12/14/2020 at 9:42 pm to provider [facility] , who verbally acknowledged these results. Electronically Signed   By: Prudencio Pair M.D.   On: 12/14/2020 21:44    Procedures Procedures  CRITICAL CARE Performed by: Quintella Reichert   Total critical care time: 45 minutes  Critical care time was exclusive of separately billable procedures and treating other patients.  Critical care was necessary to treat or prevent imminent or life-threatening deterioration.  Critical care was time spent personally by me on the following activities: development of treatment plan with patient and/or surrogate as well as nursing, discussions with consultants, evaluation of patient's response to treatment, examination of patient, obtaining history from patient or surrogate, ordering and performing treatments and interventions, ordering and review of laboratory studies, ordering and review of radiographic studies, pulse oximetry and re-evaluation of patient's condition.  Medications Ordered in ED Medications  heparin ADULT infusion 100 units/mL (25000 units/259mL) (1,000 Units/hr Intravenous New Bag/Given 12/14/20 2203)  iohexol (OMNIPAQUE) 350 MG/ML injection 100 mL (100 mLs Intravenous Contrast Given 12/14/20 2112)  heparin bolus via infusion 4,000 Units (4,000 Units Intravenous Bolus from Bag 12/14/20 2213)  sodium chloride 0.9 % bolus 1,000 mL (1,000 mLs Intravenous New Bag/Given 12/14/20 2309)    ED Course  I have reviewed the  triage vital  signs and the nursing notes.  Pertinent labs & imaging results that were available during my care of the patient were reviewed by me and considered in my medical decision making (see chart for details).    MDM Rules/Calculators/A&P                          Patient with history of GBM here for evaluation following syncopal event with possible seizure. She is ill appearing on presentation with tachycardia, tachypnea, tolerating cool extremities. She was started on IV fluids, supplemental oxygen. CTA significant for subtle PE and she was started on heparin. Due to her active GBM she is not a TPA candidate. Intensivist consulted for admission and ongoing treatment. Patient has been updated findings of studies in critical nature of illness.   Final Clinical Impression(s) / ED Diagnoses Final diagnoses:  Acute saddle pulmonary embolism with acute cor pulmonale [facility])    Rx / DC Orders ED Discharge Orders    None       Quintella Reichert, MD 12/14/20 2327

## 2020-12-14 NOTE — Progress Notes (Signed)
ANTICOAGULATION CONSULT NOTE - Initial Consult  Pharmacy Consult for Heparin Indication: pulmonary embolus  No Known Allergies  Patient Measurements: Height: 5\' 4"  (162.6 cm) Weight: 58 kg (127 lb 13.9 oz) IBW/kg (Calculated) : 54.7 Heparin Dosing Weight: total weight  Vital Signs: Temp: 99.4 F (37.4 C) (02/10 2053) Temp Source: Rectal (02/10 2053) BP: 96/69 (02/10 2034) Pulse Rate: 135 (02/10 2034)  Labs: Recent Labs    12/14/20 1207  HGB 13.7  HCT 39.5  PLT 137*  CREATININE 0.87    Estimated Creatinine Clearance: 60.1 mL/min (by C-G formula based on SCr of 0.87 mg/dL).   Medical History: Past Medical History:  Diagnosis Date  . Cervical spondylolysis 12/15/2019   Skeleton: Mild cervical spondylosis C6-7. Incidental find.   . Colon polyp   . GBM (glioblastoma multiforme) ([place])   . Left ankle sprain 10/07/2011  . Seizure ([place])    partial   . Tick bite 02/2020    Medications:  Scheduled:  Infusions:  PRN:   Assessment: 60 yo female with left frontal glioblastoma presents with a possible seizure.  Pharmacy is consulted to dose IV heparin for possible PE.  Goal of Therapy:  Heparin level 0.3-0.7 units/ml Monitor platelets by anticoagulation protocol: Yes   Plan:  Heparin 4000 units IV bolus x 1 Heparin IV infusion 1000 units/hr Check heparin level 6hrs after starting Daily heparin level and CBC  Peggyann Juba, PharmD, BCPS Pharmacy: [phone] 12/14/2020,9:33 PM

## 2020-12-14 NOTE — ED Triage Notes (Signed)
Pt to ED with c/o seizure. Pt has a known brain tumor and is currently undergoing chemo and radiation. Pt has a history of seizures related to tumor. Per pts husband the patient had a witnessed seizure this evening, with seizure like activity observed. Pt recently underwent an increase of her steroids. Pt received 4mg  zofran IV and 462ml's of NS. Arrives A+O. HRis elevated, RA sat of 83%, placed of 4lnc. Pt c/o SOB.

## 2020-12-15 ENCOUNTER — Encounter (HOSPITAL_COMMUNITY)
Admission: EM | Disposition: A | Discharge status: Home / Self Care | Payer: Self-pay | Source: Home / Self Care | Attending: Pulmonary Disease

## 2020-12-15 ENCOUNTER — Inpatient Hospital Stay (HOSPITAL_COMMUNITY): Payer: 59 | Admitting: Anesthesiology

## 2020-12-15 ENCOUNTER — Inpatient Hospital Stay (HOSPITAL_COMMUNITY)
Admit: 2020-12-15 | Discharge: 2020-12-15 | Disposition: A | Discharge status: Home / Self Care | Payer: 59 | Attending: Pulmonary Disease | Admitting: Pulmonary Disease

## 2020-12-15 ENCOUNTER — Inpatient Hospital Stay (HOSPITAL_COMMUNITY): Payer: 59

## 2020-12-15 ENCOUNTER — Other Ambulatory Visit (HOSPITAL_COMMUNITY): Payer: 59

## 2020-12-15 DIAGNOSIS — R7401 Elevation of levels of liver transaminase levels: Secondary | ICD-10-CM | POA: Diagnosis not present

## 2020-12-15 DIAGNOSIS — C719 Malignant neoplasm of brain, unspecified: Secondary | ICD-10-CM | POA: Diagnosis not present

## 2020-12-15 DIAGNOSIS — R000 Tachycardia, unspecified: Secondary | ICD-10-CM | POA: Diagnosis present

## 2020-12-15 DIAGNOSIS — T380X5A Adverse effect of glucocorticoids and synthetic analogues, initial encounter: Secondary | ICD-10-CM

## 2020-12-15 DIAGNOSIS — Z823 Family history of stroke: Secondary | ICD-10-CM | POA: Diagnosis not present

## 2020-12-15 DIAGNOSIS — E876 Hypokalemia: Secondary | ICD-10-CM | POA: Diagnosis not present

## 2020-12-15 DIAGNOSIS — I959 Hypotension, unspecified: Secondary | ICD-10-CM | POA: Diagnosis not present

## 2020-12-15 DIAGNOSIS — Z79899 Other long term (current) drug therapy: Secondary | ICD-10-CM | POA: Diagnosis not present

## 2020-12-15 DIAGNOSIS — I2602 Saddle embolus of pulmonary artery with acute cor pulmonale: Principal | ICD-10-CM

## 2020-12-15 DIAGNOSIS — Z87891 Personal history of nicotine dependence: Secondary | ICD-10-CM | POA: Diagnosis not present

## 2020-12-15 DIAGNOSIS — Z800 Family history of malignant neoplasm of digestive organs: Secondary | ICD-10-CM | POA: Diagnosis not present

## 2020-12-15 DIAGNOSIS — R739 Hyperglycemia, unspecified: Secondary | ICD-10-CM | POA: Diagnosis not present

## 2020-12-15 DIAGNOSIS — R1310 Dysphagia, unspecified: Secondary | ICD-10-CM | POA: Diagnosis not present

## 2020-12-15 DIAGNOSIS — I82413 Acute embolism and thrombosis of femoral vein, bilateral: Secondary | ICD-10-CM | POA: Diagnosis not present

## 2020-12-15 DIAGNOSIS — G40909 Epilepsy, unspecified, not intractable, without status epilepticus: Secondary | ICD-10-CM

## 2020-12-15 DIAGNOSIS — I2699 Other pulmonary embolism without acute cor pulmonale: Secondary | ICD-10-CM

## 2020-12-15 DIAGNOSIS — J9601 Acute respiratory failure with hypoxia: Secondary | ICD-10-CM | POA: Diagnosis present

## 2020-12-15 DIAGNOSIS — D630 Anemia in neoplastic disease: Secondary | ICD-10-CM | POA: Diagnosis not present

## 2020-12-15 DIAGNOSIS — E559 Vitamin D deficiency, unspecified: Secondary | ICD-10-CM | POA: Diagnosis not present

## 2020-12-15 DIAGNOSIS — Z833 Family history of diabetes mellitus: Secondary | ICD-10-CM | POA: Diagnosis not present

## 2020-12-15 DIAGNOSIS — Z66 Do not resuscitate: Secondary | ICD-10-CM | POA: Diagnosis present

## 2020-12-15 DIAGNOSIS — C711 Malignant neoplasm of frontal lobe: Secondary | ICD-10-CM | POA: Diagnosis not present

## 2020-12-15 DIAGNOSIS — Z8249 Family history of ischemic heart disease and other diseases of the circulatory system: Secondary | ICD-10-CM | POA: Diagnosis not present

## 2020-12-15 DIAGNOSIS — Z20822 Contact with and (suspected) exposure to covid-19: Secondary | ICD-10-CM | POA: Diagnosis not present

## 2020-12-15 HISTORY — PX: IR THROMBECT PRIM MECH INIT (INCLU) MOD SED: IMG2297

## 2020-12-15 HISTORY — PX: IR ANGIOGRAM PULMONARY BILATERAL SELECTIVE: IMG664

## 2020-12-15 HISTORY — PX: IR US GUIDE VASC ACCESS RIGHT: IMG2390

## 2020-12-15 HISTORY — PX: RADIOLOGY WITH ANESTHESIA: SHX6223

## 2020-12-15 HISTORY — PX: IR IVC FILTER PLMT / S&I /IMG GUID/MOD SED: IMG701

## 2020-12-15 HISTORY — PX: IR ANGIOGRAM SELECTIVE EACH ADDITIONAL VESSEL: IMG667

## 2020-12-15 LAB — POCT ACTIVATED CLOTTING TIME
Activated Clotting Time: 154 seconds
Activated Clotting Time: 154 seconds
Activated Clotting Time: 255 seconds
Activated Clotting Time: 267 seconds
Activated Clotting Time: 237 seconds

## 2020-12-15 LAB — CBC
HCT: 37 % (ref 36.0–46.0)
Hemoglobin: 12.2 g/dL (ref 12.0–15.0)
MCH: 32.9 pg (ref 26.0–34.0)
MCHC: 33 g/dL (ref 30.0–36.0)
MCV: 99.7 fL (ref 80.0–100.0)
Platelets: 145 10*3/uL — ABNORMAL LOW (ref 150–400)
RBC: 3.71 MIL/uL — ABNORMAL LOW (ref 3.87–5.11)
RDW: 11.9 % (ref 11.5–15.5)
WBC: 7 10*3/uL (ref 4.0–10.5)
nRBC: 0 % (ref 0.0–0.2)

## 2020-12-15 LAB — CBG MONITORING, ED
Glucose-Capillary: 170 mg/dL — ABNORMAL HIGH (ref 70–99)
Glucose-Capillary: 133 mg/dL — ABNORMAL HIGH (ref 70–99)
Glucose-Capillary: 114 mg/dL — ABNORMAL HIGH (ref 70–99)

## 2020-12-15 LAB — BASIC METABOLIC PANEL
Anion gap: 11 (ref 5–15)
BUN: 18 mg/dL (ref 6–20)
CO2: 19 mmol/L — ABNORMAL LOW (ref 22–32)
Calcium: 7.7 mg/dL — ABNORMAL LOW (ref 8.9–10.3)
Chloride: 113 mmol/L — ABNORMAL HIGH (ref 98–111)
Creatinine, Ser: 0.6 mg/dL (ref 0.44–1.00)
GFR, Estimated: >60 mL/min (ref >60)
Glucose, Bld: 124 mg/dL — ABNORMAL HIGH (ref 70–99)
Potassium: 3.8 mmol/L (ref 3.5–5.1)
Sodium: 143 mmol/L (ref 135–145)

## 2020-12-15 LAB — APTT
aPTT: >200 seconds (ref 24–36)
aPTT: 37 seconds — ABNORMAL HIGH (ref 24–36)

## 2020-12-15 LAB — TYPE AND SCREEN
ABO/RH(D): B POS
Antibody Screen: NEGATIVE

## 2020-12-15 LAB — RESP PANEL BY RT-PCR (FLU A&B, COVID) ARPGX2
Influenza A by PCR: NEGATIVE
Influenza B by PCR: NEGATIVE
SARS Coronavirus 2 by RT PCR: NEGATIVE

## 2020-12-15 LAB — HEMOGLOBIN A1C
Hgb A1c MFr Bld: 5.1 % (ref 4.8–5.6)
Mean Plasma Glucose: 99.67 mg/dL

## 2020-12-15 LAB — MAGNESIUM: Magnesium: 1.9 mg/dL (ref 1.7–2.4)

## 2020-12-15 LAB — HEPARIN LEVEL (UNFRACTIONATED)
Heparin Unfractionated: 1.5 IU/mL — ABNORMAL HIGH (ref 0.30–0.70)
Heparin Unfractionated: 0.28 IU/mL — ABNORMAL LOW (ref 0.30–0.70)

## 2020-12-15 LAB — GLUCOSE, CAPILLARY
Glucose-Capillary: 113 mg/dL — ABNORMAL HIGH (ref 70–99)
Glucose-Capillary: 92 mg/dL (ref 70–99)

## 2020-12-15 SURGERY — IR WITH ANESTHESIA
Anesthesia: Monitor Anesthesia Care

## 2020-12-15 MED ORDER — DOCUSATE SODIUM 100 MG PO CAPS
100.0000 mg | ORAL_CAPSULE | Freq: Two times a day (BID) | ORAL | Status: DC | PRN
  Administered 2020-12-17 (×2): 100 mg via ORAL
  Filled 2020-12-15 (×2): qty 1

## 2020-12-15 MED ORDER — CHLORHEXIDINE GLUCONATE CLOTH 2 % EX PADS
6.0000 | MEDICATED_PAD | Freq: Every day | CUTANEOUS | Status: DC
  Administered 2020-12-15 – 2020-12-18 (×4): 6 via TOPICAL

## 2020-12-15 MED ORDER — LIDOCAINE HCL (PF) 1 % IJ SOLN
INTRAMUSCULAR | Status: DC | PRN
  Administered 2020-12-15: 10 mL

## 2020-12-15 MED ORDER — IOHEXOL 300 MG/ML  SOLN
150.0000 mL | Freq: Once | INTRAMUSCULAR | Status: AC | PRN
  Administered 2020-12-15: 50 mL via INTRAVENOUS

## 2020-12-15 MED ORDER — ONDANSETRON HCL 4 MG PO TABS
8.0000 mg | ORAL_TABLET | Freq: Two times a day (BID) | ORAL | Status: DC | PRN
Start: 2020-12-15 — End: 2020-12-19

## 2020-12-15 MED ORDER — DEXAMETHASONE 4 MG PO TABS
8.0000 mg | ORAL_TABLET | Freq: Every day | ORAL | Status: DC
  Administered 2020-12-15 – 2020-12-19 (×5): 8 mg via ORAL
  Filled 2020-12-15 (×5): qty 2

## 2020-12-15 MED ORDER — TEMOZOLOMIDE 20 MG PO CAPS: 20.0000 mg | ORAL_CAPSULE | Freq: Every day | ORAL | Status: DC

## 2020-12-15 MED ORDER — POLYETHYLENE GLYCOL 3350 17 G PO PACK
17.0000 g | PACK | Freq: Every day | ORAL | Status: DC | PRN
  Administered 2020-12-17: 17 g via ORAL
  Filled 2020-12-15: qty 1

## 2020-12-15 MED ORDER — ORAL CARE MOUTH RINSE
15.0000 mL | Freq: Two times a day (BID) | OROMUCOSAL | Status: DC
  Administered 2020-12-15 – 2020-12-18 (×7): 15 mL via OROMUCOSAL

## 2020-12-15 MED ORDER — SODIUM CHLORIDE 0.9 % IV SOLN: INTRAVENOUS | Status: DC | PRN

## 2020-12-15 MED ORDER — PANTOPRAZOLE SODIUM 40 MG IV SOLR
40.0000 mg | Freq: Every day | INTRAVENOUS | Status: DC
  Administered 2020-12-15 – 2020-12-16 (×3): 40 mg via INTRAVENOUS
  Filled 2020-12-15 (×3): qty 40

## 2020-12-15 MED ORDER — VITAMIN D 25 MCG (1000 UNIT) PO TABS
1000.0000 [IU] | ORAL_TABLET | Freq: Every day | ORAL | Status: DC
  Administered 2020-12-15 – 2020-12-19 (×5): 1000 [IU] via ORAL
  Filled 2020-12-15 (×5): qty 1

## 2020-12-15 MED ORDER — FENTANYL CITRATE (PF) 100 MCG/2ML IJ SOLN
INTRAMUSCULAR | Status: DC | PRN
  Administered 2020-12-15 (×2): 25 ug via INTRAVENOUS

## 2020-12-15 MED ORDER — INSULIN ASPART 100 UNIT/ML ~~LOC~~ SOLN
0.0000 [IU] | SUBCUTANEOUS | Status: DC
  Administered 2020-12-15 (×2): 2 [IU] via SUBCUTANEOUS
  Administered 2020-12-15: 3 [IU] via SUBCUTANEOUS
  Filled 2020-12-15: qty 0.15

## 2020-12-15 MED ORDER — HEPARIN (PORCINE) 25000 UT/250ML-% IV SOLN
950.0000 [IU]/h | INTRAVENOUS | Status: DC
  Administered 2020-12-15: 850 [IU]/h via INTRAVENOUS
  Administered 2020-12-16: 800 [IU]/h via INTRAVENOUS
  Administered 2020-12-17: 950 [IU]/h via INTRAVENOUS
  Filled 2020-12-15 (×4): qty 250

## 2020-12-15 MED ORDER — CYCLOBENZAPRINE HCL 10 MG PO TABS: 10.0000 mg | ORAL_TABLET | Freq: Three times a day (TID) | ORAL | Status: DC | PRN

## 2020-12-15 MED ORDER — LIDOCAINE HCL 1 % IJ SOLN
INTRAMUSCULAR | Status: AC
  Filled 2020-12-15: qty 20

## 2020-12-15 MED ORDER — HEPARIN SODIUM (PORCINE) 1000 UNIT/ML IJ SOLN
INTRAMUSCULAR | Status: DC | PRN
  Administered 2020-12-15: 5000 [IU] via INTRAVENOUS

## 2020-12-15 MED ORDER — LEVETIRACETAM 500 MG PO TABS
500.0000 mg | ORAL_TABLET | Freq: Two times a day (BID) | ORAL | Status: DC
  Administered 2020-12-15 – 2020-12-19 (×10): 500 mg via ORAL
  Filled 2020-12-15 (×10): qty 1

## 2020-12-15 MED ORDER — TEMOZOLOMIDE 100 MG PO CAPS: 100.0000 mg | ORAL_CAPSULE | Freq: Every day | ORAL | Status: DC

## 2020-12-15 MED ORDER — MIDAZOLAM HCL 2 MG/2ML IJ SOLN
INTRAMUSCULAR | Status: DC | PRN
  Administered 2020-12-15: .5 mg via INTRAVENOUS
  Administered 2020-12-15 (×2): 1 mg via INTRAVENOUS

## 2020-12-15 NOTE — Progress Notes (Signed)
Bilateral lower extremity venous duplex has been completed. Preliminary results can be found in CV Proc through chart review.  Results were given to Dr. Elsworth Soho.  12/15/20 11:13 AM Carlos Levering RVT

## 2020-12-15 NOTE — ED Notes (Signed)
hospitalist at bedside

## 2020-12-15 NOTE — Procedures (Signed)
Interventional Radiology Procedure Note  Procedure:  1. PE Thrombectomy 2. IVC filter palcement  Indication:  1. Submassive PE 2. Lower extremity DVT  Findings: Please refer to procedural dictation for full description.  Complications: None  EBL: < 200 mL  Miachel Roux, MD [phone]

## 2020-12-15 NOTE — Sedation Documentation (Signed)
Pressures pre procedure as are follows  PA 54/31 41  Arterial 87/67 with heart rate of 107 Oxygen 93% on 15L non-rebreather  Post procedure:  Arterial 117/64 with heart rate of 81 Oxygen 100% on 10L non-rebreather

## 2020-12-15 NOTE — ED Notes (Signed)
Family at bedside. 

## 2020-12-15 NOTE — Consult Note (Signed)
Chief Complaint: Patient was seen in consultation today for  Chief Complaint  Patient presents with  . Seizures    Referring Physician(s): Dr. Elsworth Soho  Supervising Physician: Mir, Sharen Heck  Patient Status: [facility] - In-pt  History of Present Illness: Hannah [name] is a 60 y.o. female with a medical history significant for glioblastoma multiforme and seizures. She presented to the [facility] ED 12/14/20 reporting a possible seizure with severe shortness of breath and upon evaluation she was tachycardic with tachypnea and hypotension. CTA of the chest revealed a saddle pulmonary embolism with near but not complete occlusive clot burden and evidence of right heart strain. Lower extremity duplex imaging identified bilateral femoral vein thromboses. The patient is not a candidate for thrombolytics due to her known malignancy so she was started in IV Heparin. The patient was transferred to [facility] for further care.    Interventional Radiology has been asked to evaluate this patient for an image-guided pulmonary angiogram with thrombectomy versus lysis; inferior vena cava filter placement. This case has been reviewed and procedure approved by Dr. Dwaine Gale.    Past Medical History:  Diagnosis Date  . Cervical spondylolysis 12/15/2019   Skeleton: Mild cervical spondylosis C6-7. Incidental find.   . Colon polyp   . GBM (glioblastoma multiforme) ([place])   . Left ankle sprain 10/07/2011  . Seizure ([place])    partial   . Tick bite 02/2020    Past Surgical History:  Procedure Laterality Date  . APPLICATION OF CRANIAL NAVIGATION Left 02/16/2020   Procedure: APPLICATION OF CRANIAL NAVIGATION;  Surgeon: Vallarie Mare, MD;  Location: [place];  Service: Neurosurgery;  Laterality: Left;  . CRANIOTOMY Left 02/16/2020   Procedure: LEFT FRONTAL CRANIOTOMY FOR BRAIN TUMOR;  Surgeon: Vallarie Mare, MD;  Location: [place];  Service: Neurosurgery;  Laterality: Left;  . FOOT SURGERY Left    bone spur - local  anesthesia  . TONSILECTOMY/ADENOIDECTOMY WITH MYRINGOTOMY     tonsils and adenoids out only  . WISDOM TOOTH EXTRACTION      Allergies: Patient has no known allergies.  Medications: Prior to Admission medications   Medication Sig Start Date End Date Taking? Authorizing Provider  cholecalciferol (VITAMIN D3) 25 MCG (1000 UNIT) tablet Take 1,000 Units by mouth daily.   Yes [provider]  cyclobenzaprine (FLEXERIL) 10 MG tablet Take 1 tablet (10 mg total) by mouth 3 (three) times daily as needed for muscle spasms. 12/07/20  Yes Kuneff, Renee A, DO  dexamethasone (DECADRON) 1 MG tablet Take 2 tablets (2 mg total) by mouth daily. Patient taking differently: Take 8 mg by mouth daily. 11/09/20  Yes Vaslow, Acey Lav, MD  docusate sodium (COLACE) 100 MG capsule Take 100 mg by mouth 2 (two) times daily as needed for mild constipation.   Yes [provider]  HYDROcodone-acetaminophen (NORCO/VICODIN) 5-325 MG tablet Take 1 tablet by mouth every 6 (six) hours as needed. 12/07/20  Yes Kuneff, Renee A, DO  levETIRAcetam (KEPPRA) 500 MG tablet Take 1 tablet (500 mg total) by mouth 2 (two) times daily. 09/07/20  Yes Vaslow, Acey Lav, MD  LORazepam (ATIVAN) 0.5 MG tablet Place 1 tablet (0.5 mg total) under the tongue every 6 (six) hours as needed for anxiety. Patient not taking: No sig reported 10/03/20   Sandi Mealy E., PA-C  ondansetron (ZOFRAN) 8 MG tablet Take 1 tablet (8 mg total) by mouth 2 (two) times daily as needed (nausea and vomiting). May take 30-60 minutes prior to Temodar administration  if nausea/vomiting occurs. Patient not taking: No sig reported 08/15/20   [name], MD  temozolomide (TEMODAR) 100 MG capsule Take 1 capsule (100 mg total) by mouth daily. May take on an empty stomach to decrease nausea & vomiting. 08/15/20   [name], MD  temozolomide (TEMODAR) 20 MG capsule Take 1 capsule (20 mg total) by mouth daily. May take on an empty stomach to decrease  nausea & vomiting. 08/15/20   [name], MD     Family History  Problem Relation Age of Onset  . Hypertension Mother   . Stroke Mother   . Diabetes Mother   . Colon cancer Father 77  . Hypertension Sister   . Colon polyps Sister   . Heart disease Brother 52  . Diabetes Maternal Uncle   . Diabetes Maternal Uncle     Social History   Socioeconomic History  . Marital status: Married    Spouse name: Not on file  . Number of children: 3  . Years of education: Not on file  . Highest education level: Not on file  Occupational History  . Not on file  Tobacco Use  . Smoking status: Former Smoker    Years: 2.00    Types: Cigarettes    Quit date: 11/05/1979    Years since quitting: 41.1  . Smokeless tobacco: Never Used  . Tobacco comment: 1/2 pack a week  Vaping Use  . Vaping Use: Never used  Substance and Sexual Activity  . Alcohol use: Yes    Alcohol/week: 0.0 standard drinks    Comment: 1 glass of wine weekly  . Drug use: No  . Sexual activity: Yes    Birth control/protection: Surgical  Other Topics Concern  . Not on file  Social History Narrative  . Not on file   Social Determinants of Health   Financial Resource Strain: Not on file  Food Insecurity: Not on file  Transportation Needs: Not on file  Physical Activity: Not on file  Stress: Not on file  Social Connections: Not on file    Review of Systems: A 12 point ROS discussed and pertinent positives are indicated in the HPI above.  All other systems are negative.  Review of Systems  Unable to perform ROS: Acuity of condition    Vital Signs: BP 99/82   Pulse (!) 104   Temp 99.4 F (37.4 C)   Resp (!) 23   Ht 5\' 4"  (1.626 m)   Wt 127 lb 13.9 oz (58 kg)   LMP 06/30/2015   SpO2 95%   BMI 21.95 kg/m   Physical Exam Constitutional:      General: She is in acute distress.     Appearance: She is ill-appearing.  HENT:     Mouth/Throat:     Mouth: Mucous membranes are moist.     Pharynx:  Oropharynx is clear.  Cardiovascular:     Rate and Rhythm: Regular rhythm. Tachycardia present.     Heart sounds: Normal heart sounds.     Comments: Unable to palpate dorsalis pedis pulses  Pulmonary:     Effort: Respiratory distress present.     Comments: Mild respiratory distress; receiving oxygen via face mask. Diminished breath sounds.  Abdominal:     General: Bowel sounds are normal.     Palpations: Abdomen is soft.     Tenderness: There is no abdominal tenderness.  Genitourinary:    Comments: Patient states she has been unable to void since last night  despite multiple attempts  Musculoskeletal:     Right lower leg: No edema.     Left lower leg: No edema.  Skin:    General: Skin is warm and dry.  Neurological:     Mental Status: She is alert and oriented to person, place, and time.     Imaging: DG Ribs Unilateral W/Chest Left  Result Date: 12/08/2020 CLINICAL DATA:  Left lower chest pain. EXAM: LEFT RIBS AND CHEST - 3+ VIEW COMPARISON:  03/10/2013 FINDINGS: Mild left lower lobe atelectasis. Right lung clear. Negative for effusion or pneumothorax. Heart size and vascularity normal. Healed fracture left sixth rib.  No acute fracture or bone lesion. IMPRESSION: Mild left lower lobe atelectasis. Negative for acute rib abnormality on the left. Electronically Signed   By: Franchot Gallo M.D.   On: 12/08/2020 10:33   CT Head Wo Contrast  Result Date: 12/14/2020 CLINICAL DATA:  60 year old female with syncope, seizure, and abnormal neurological exam. Known glioblastoma undergoing chemo and radiation treatment. EXAM: CT HEAD WITHOUT CONTRAST TECHNIQUE: Contiguous axial images were obtained from the base of the skull through the vertex without intravenous contrast. COMPARISON:  Brain MRI dated 11/02/2020. FINDINGS: Brain: Ill-defined 3.8 x 2.3 cm low attenuating area centered at anterior corpus callosum and crossing the midline correspond to the necrotic mass seen on the prior MRI. There is  interval development of moderate amount of white matter edema predominantly involving the frontal lobes, increased since the prior MRI. There is approximately 4 mm right to left midline shift (measured at the level of the foramen Monro). Ill-defined area of higher attenuation involving the left frontal convexity (23/2) may represent volume averaging from adjacent cortices or additional mass. MRI without and with contrast is recommended for better evaluation of the mass and comparison with prior MRI images. There is no acute intracranial hemorrhage. Vascular: No hyperdense vessel or unexpected calcification. Skull: Left frontal craniotomy.  No acute calvarial pathology. Sinuses/Orbits: No acute finding. Other: None IMPRESSION: 1. Increase in the degree of edema with 4 mm right to left midline shift. MRI without and with contrast may provide better evaluation. 2. No acute intracranial hemorrhage. These results were called by telephone at the time of interpretation on 12/14/2020 at 9:42 pm to provider [facility] , who verbally acknowledged these results. Electronically Signed   By: Anner Crete M.D.   On: 12/14/2020 21:49   CT Angio Chest PE W/Cm &/Or Wo Cm  Result Date: 12/14/2020 CLINICAL DATA:  History of brain tumor shortness of breath EXAM: CT ANGIOGRAPHY CHEST WITH CONTRAST TECHNIQUE: Multidetector CT imaging of the chest was performed using the standard protocol during bolus administration of intravenous contrast. Multiplanar CT image reconstructions and MIPs were obtained to evaluate the vascular anatomy. CONTRAST:  181mL OMNIPAQUE IOHEXOL 350 MG/ML SOLN COMPARISON:  None. FINDINGS: Cardiovascular: There is a optimal opacification of the pulmonary arteries. There is partially occlusive thrombus within the central central bifurcation of the main pulmonary arteries with nearly occlusive thrombus in the bilateral segmental and subsegmental arterial branches throughout both lungs. There is evidence of  right ventricular heart strain, RV/LV ratio equals 2.1 there is a small pericardial effusion present. There is normal three-vessel brachiocephalic anatomy without proximal stenosis. Scattered mild aortic atherosclerosis is seen. Mediastinum/Nodes: No hilar, mediastinal, or axillary adenopathy. Thyroid gland, trachea, and esophagus demonstrate no significant findings. Lungs/Pleura: A small left pleural effusion with adjacent patchy airspace opacity at the left lung base, likely atelectasis is noted. Upper Abdomen: No acute abnormalities present in  the visualized portions of the upper abdomen. Hree focus of contrast is seen within the IVC, likely consistent with right heart strain. Musculoskeletal: No chest wall abnormality. No acute or significant osseous findings. Review of the MIP images confirms the above findings. IMPRESSION: Partially occlusive saddle emboli with nearly occlusive thrombus within both lungs throughout the segmental and subsegmental arterial branches. CTevidence of right heart strain (RV/LV Ratio = 2.1) consistent with at least submassive (intermediate risk) PE. The presence of right heart strain has been associated with an increased risk of morbidity and mortality. Small pericardial effusion Small left pleural effusion with basilar atelectasis These results were called by telephone at the time of interpretation on 12/14/2020 at 9:42 pm to provider [facility] , who verbally acknowledged these results. Electronically Signed   By: Prudencio Pair M.D.   On: 12/14/2020 21:44   EEG adult  Result Date: 12/15/2020 Lora Havens, MD     12/15/2020 10:32 AM Patient Name: Hannah [name] MRN: [id number] Epilepsy Attending: Lora Havens Referring Physician/Provider: Dr Renee Pain Date: 12/15/2020 Duration: 23.35 mins Patient history: 60 yo with GBM. EEG to evaluate for seizure Level of alertness: Awake AEDs during EEG study: LEV Technical aspects: This EEG study was done with scalp electrodes  positioned according to the 10-20 International system of electrode placement. Electrical activity was acquired at a sampling rate of 500Hz  and reviewed with a high frequency filter of 70Hz  and a low frequency filter of 1Hz . EEG data were recorded continuously and digitally stored. Description: The posterior dominant rhythm consists of 10 Hz activity of moderate voltage (25-35 uV) seen predominantly in posterior head regions, symmetric and reactive to eye opening and eye closing. Hyperventilation and photic stimulation were not performed.   IMPRESSION: This study is within normal limits. No seizures or epileptiform discharges were seen throughout the recording. Priyanka O Yadav   VAS [place] LOWER EXTREMITY VENOUS (DVT)  Result Date: 12/15/2020  Lower Venous DVT Study Indications: Pulmonary embolism.  Risk Factors: Cancer. Comparison Study: No prior studies. Performing Technologist: Oliver Hum RVT  Examination Guidelines: A complete evaluation includes B-mode imaging, spectral Doppler, color Doppler, and power Doppler as needed of all accessible portions of each vessel. Bilateral testing is considered an integral part of a complete examination. Limited examinations for reoccurring indications may be performed as noted. The reflux portion of the exam is performed with the patient in reverse Trendelenburg.  +---------+---------------+---------+-----------+----------+--------------+ RIGHT    CompressibilityPhasicitySpontaneityPropertiesThrombus Aging +---------+---------------+---------+-----------+----------+--------------+ CFV      Full           Yes      Yes                                 +---------+---------------+---------+-----------+----------+--------------+ SFJ      Full                                                        +---------+---------------+---------+-----------+----------+--------------+ FV Prox  Full                                                         +---------+---------------+---------+-----------+----------+--------------+  FV Mid   None           No       No                   Acute          +---------+---------------+---------+-----------+----------+--------------+ FV DistalFull                                                        +---------+---------------+---------+-----------+----------+--------------+ PFV      Full                                                        +---------+---------------+---------+-----------+----------+--------------+ POP      Full           Yes      Yes                                 +---------+---------------+---------+-----------+----------+--------------+ PTV      Full                                                        +---------+---------------+---------+-----------+----------+--------------+ PERO     Full                                                        +---------+---------------+---------+-----------+----------+--------------+   +---------+---------------+---------+-----------+----------+--------------+ LEFT     CompressibilityPhasicitySpontaneityPropertiesThrombus Aging +---------+---------------+---------+-----------+----------+--------------+ CFV      Full           Yes      Yes                                 +---------+---------------+---------+-----------+----------+--------------+ SFJ      Full                                                        +---------+---------------+---------+-----------+----------+--------------+ FV Prox  Full                                                        +---------+---------------+---------+-----------+----------+--------------+ FV Mid   None           No       No                   Acute          +---------+---------------+---------+-----------+----------+--------------+ FV DistalNone  No       No                   Acute           +---------+---------------+---------+-----------+----------+--------------+ PFV      Full                                                        +---------+---------------+---------+-----------+----------+--------------+ POP      Full           Yes      Yes                                 +---------+---------------+---------+-----------+----------+--------------+ PTV      Full                                                        +---------+---------------+---------+-----------+----------+--------------+ PERO     Full                                                        +---------+---------------+---------+-----------+----------+--------------+     Summary: RIGHT: - Findings consistent with acute deep vein thrombosis involving the right femoral vein. - No cystic structure found in the popliteal fossa.  LEFT: - Findings consistent with acute deep vein thrombosis involving the left femoral vein. - No cystic structure found in the popliteal fossa.  *See table(s) above for measurements and observations.    Preliminary     Labs:  CBC: Recent Labs    11/09/20 1235 12/14/20 1207 12/14/20 2138 12/14/20 2145 12/15/20 0510  WBC 3.7* 7.8 11.4*  --  7.0  HGB 13.3 13.7 12.1 11.9* 12.2  HCT 39.2 39.5 36.5 35.0* 37.0  PLT 165 137* 135*  --  145*    COAGS: Recent Labs    12/14/20 2138 12/15/20 0740  INR 1.3*  --   APTT 35 >200*    BMP: Recent Labs    02/17/20 0136 08/02/20 1414 11/09/20 1235 12/14/20 1207 12/14/20 2138 12/14/20 2145 12/15/20 0510  NA 142   < > 139 141 136 135 143  K 3.9   < > 3.8 4.0 4.8 4.8 3.8  CL 109   < > 106 104 102 102 113*  CO2 24   < > 26 26 20*  --  19*  GLUCOSE 142*   < > 90 118* 280* 268* 124*  BUN 6   < > 12 13 21* 21* 18  CALCIUM 8.8*   < > 9.2 9.4 8.3*  --  7.7*  CREATININE 0.78   < > 0.79 0.87 1.18* 1.00 0.60  GFRNONAA >60   < > >60 >60 53*  --  >60  GFRAA >60  --   --   --   --   --   --    < > = values in this interval  not displayed.    LIVER FUNCTION TESTS:  Recent Labs    10/05/20 1432 11/09/20 1235 12/14/20 1207 12/14/20 2138  BILITOT 0.4 0.8 0.5 0.9  AST 15 13* 13* 141*  ALT 16 13 9  92*  ALKPHOS 37* 42 62 81  PROT 6.0* 6.4* 6.8 5.9*  ALBUMIN 3.6 3.6 3.5 3.0*    TUMOR MARKERS: No results for input(s): AFPTM, CEA, CA199, CHROMGRNA in the last 8760 hours.  Assessment and Plan:  Bilateral femoral deep vein thromboses; saddle pulmonary embolus with evidence of right heart strain: Hannah [name], 60 year old female, presents to the [facility] department for an image-guided pulmonary angiogram with thrombectomy versus lysis and the placement of an inferior vena cava filter. Her husband is at the bedside for assessment and consent obtainment. She has a DNR order in place but both the patient and her husband request this to be rescinded for this procedure. They both state that they would want chest compressions, cardio-supportive drugs, intubation and any other reasonable life-saving measures. Anesthesia will provide the sedation for this procedure.  Risks and benefits of an image-guided pulmonary angiogram with thrombectomy versus lysis were discussed with the patient and her husband including, but not limited to bleeding, possible life threatening bleeding and need for blood product transfusion, vascular injury, stroke, contrast induced renal failure, limb loss and infection.  Risks and benefits of an inferior vena cava filter placement were discussed with the patient and her husband including, but not limited to bleeding, infection, contrast induced renal failure, filter fracture or migration which can lead to emergency surgery or even death, strut penetration with damage or irritation to adjacent structures and caval thrombosis.  All of the patient's questions were answered, patient is agreeable to proceed.  Consent signed and in chart.  Thank you for this interesting  consult.  I greatly enjoyed meeting Jaley A Brannan and look forward to participating in their care.  A copy of this report was sent to the requesting provider on this date.  Electronically Signed: Soyla Dryer, AGACNP-BC [phone] 12/15/2020, 1:05 PM   I spent a total of 40 Minutes    in face to face in clinical consultation, greater than 50% of which was counseling/coordinating care for an image-guided pulmonary angiogram with thrombectomy versus lysis and the placement of an inferior vena cava filter.

## 2020-12-15 NOTE — Progress Notes (Signed)
ANTICOAGULATION CONSULT NOTE  Pharmacy Consult for Heparin Indication: pulmonary embolus  No Known Allergies  Patient Measurements: Height: 5\' 4"  (162.6 cm) Weight: 58 kg (127 lb 13.9 oz) IBW/kg (Calculated) : 54.7 Heparin Dosing Weight: total weight  Vital Signs: BP: 89/76 (02/11 0900) Pulse Rate: 105 (02/11 0900)  Labs: Recent Labs    12/14/20 1207 12/14/20 1207 12/14/20 2138 12/14/20 2145 12/15/20 0510 12/15/20 0740  HGB 13.7   < > 12.1 11.9* 12.2  --   HCT 39.5  --  36.5 35.0* 37.0  --   PLT 137*  --  135*  --  145*  --   APTT  --   --  35  --   --  >200*  LABPROT  --   --  15.3*  --   --   --   INR  --   --  1.3*  --   --   --   CREATININE 0.87  --  1.18* 1.00 0.60  --   TROPONINIHS  --   --  149*  --   --   --    < > = values in this interval not displayed.    Estimated Creatinine Clearance: 65.4 mL/min (by C-G formula based on SCr of 0.6 mg/dL).   Medical History: Past Medical History:  Diagnosis Date  . Cervical spondylolysis 12/15/2019   Skeleton: Mild cervical spondylosis C6-7. Incidental find.   . Colon polyp   . GBM (glioblastoma multiforme) ([place])   . Left ankle sprain 10/07/2011  . Seizure ([place])    partial   . Tick bite 02/2020   Assessment: 60 yo female with left frontal glioblastoma presents with a possible seizure.  Pharmacy is consulted to dose IV heparin for saddle PE. 12/15/2020  04 AM heparin level drawn but not able to be run by lab -? Quantity insufficient HL redrawn at 0740 AM - lab called & said unable to get heparin level to result> ran on 2 different analyzers and got "linearity error" on both machines, other heparin levels resulting w/ no problem. Unknown why heparin level getting error> added on aPTT = > 200 seconds.  No bleeding reported.  To be transferred to MD for IR aspiration.   Goal of Therapy:  Heparin level 0.3-0.7 units/ml Monitor platelets by anticoagulation protocol: Yes   Plan:  Hold heparin x 1 hour, resume at 850  units/hr and check  heparin level 6hrs after re-started Daily heparin level and CBC  Eudelia Bunch, Pharm.D 12/15/2020 10:30 AM Pharmacy: [phone] 12/15/2020,10:24 AM

## 2020-12-15 NOTE — Anesthesia Preprocedure Evaluation (Signed)
Anesthesia Evaluation  Patient identified by MRN, date of birth, ID band Patient awake    Reviewed: Allergy & Precautions, H&P , NPO status , Patient's Chart, lab work & pertinent test results  Airway Mallampati: II   Neck ROM: full    Dental   Pulmonary former smoker, PE Pt with acute saddle embolus.   breath sounds clear to auscultation       Cardiovascular  Rhythm:regular Rate:Normal  Troponin elevated. Possible right heart strain from PE.   Neuro/Psych Seizures -,  H/o glioblastoma (resected 2021)    GI/Hepatic   Endo/Other    Renal/GU      Musculoskeletal   Abdominal   Peds  Hematology   Anesthesia Other Findings   Reproductive/Obstetrics                             Anesthesia Physical Anesthesia Plan  ASA: III and emergent  Anesthesia Plan: MAC   Post-op Pain Management:    Induction: Intravenous  PONV Risk Score and Plan: 2 and Ondansetron, Midazolam and Treatment may vary due to age or medical condition  Airway Management Planned: Simple Face Mask  Additional Equipment: Arterial line  Intra-op Plan:   Post-operative Plan:   Informed Consent: I have reviewed the patients History and Physical, chart, labs and discussed the procedure including the risks, benefits and alternatives for the proposed anesthesia with the patient or authorized representative who has indicated his/her understanding and acceptance.     Dental advisory given  Plan Discussed with: CRNA, Anesthesiologist and Surgeon  Anesthesia Plan Comments:         Anesthesia Quick Evaluation

## 2020-12-15 NOTE — Progress Notes (Signed)
eLink Physician-Brief Progress Note Patient Name: Hannah [name] DOB: February 16, 1961 MRN: [id number]   Date of Service  12/15/2020  HPI/Events of Note  Patient had successful bilateral thrombectomy with IVC filter placement, per radiologist's report considerable clot was removed but it appears that there is residual clot. Pharmacy asking about resuming heparin infusion. I reviewed the latest CT brain report which  indicates there is no evidence of intracranial hemorrhage.  eICU Interventions  Pharmacy instructed to resume the Heparin infusion without a bolus once patient meets factor Xa criteria for resumption.        Frederik Pear 12/15/2020, 8:13 PM

## 2020-12-15 NOTE — Procedures (Signed)
Patient Name: Hannah [name]  MRN: [id number]  Epilepsy Attending: Lora Havens  Referring Physician/Provider: Dr Renee Pain Date: 12/15/2020 Duration: 23.35 mins  Patient history: 60 yo with GBM. EEG to evaluate for seizure  Level of alertness: Awake  AEDs during EEG study: LEV  Technical aspects: This EEG study was done with scalp electrodes positioned according to the 10-20 International system of electrode placement. Electrical activity was acquired at a sampling rate of 500Hz  and reviewed with a high frequency filter of 70Hz  and a low frequency filter of 1Hz . EEG data were recorded continuously and digitally stored.   Description: The posterior dominant rhythm consists of 10 Hz activity of moderate voltage (25-35 uV) seen predominantly in posterior head regions, symmetric and reactive to eye opening and eye closing. Hyperventilation and photic stimulation were not performed.     IMPRESSION: This study is within normal limits. No seizures or epileptiform discharges were seen throughout the recording.  Aerilyn Slee Barbra Sarks

## 2020-12-15 NOTE — Anesthesia Procedure Notes (Signed)
Arterial Line Insertion Start/End2/09/2021 1:55 PM, 12/15/2020 2:04 PM Performed by: Albertha Ghee, MD  Patient location: Pre-op. Preanesthetic checklist: patient identified, IV checked, site marked, risks and benefits discussed, surgical consent, monitors and equipment checked, pre-op evaluation, timeout performed and anesthesia consent Lidocaine 1% used for infiltration Left, radial was placed Catheter size: 20 G Hand hygiene performed  and maximum sterile barriers used  Allen's test indicative of satisfactory collateral circulation Attempts: 2 Procedure performed using ultrasound guided technique. Ultrasound Notes:anatomy identified Following insertion, dressing applied and Biopatch. Post procedure assessment: normal and unchanged  Patient tolerated the procedure well with no immediate complications.

## 2020-12-15 NOTE — ED Notes (Signed)
EEG being performed at bedside

## 2020-12-15 NOTE — H&P (Signed)
NAME:  Hannah [name] MRN:  [id number] DOB:  1961-07-14 LOS: 0 ADMISSION DATE:  12/14/2020 DATE OF SERVICE:  12/15/2020  CHIEF COMPLAINT:  Altered mental status; dyspnea   HISTORY & PHYSICAL  History of Present Illness  [name] presented to the [facility] Emergency Department with complaints of acute alteration in mental status today following a course of rapidly progressive worsening dyspnea over the past 6 days.  She has a known history of recurrent glioblastoma multiforme.  The patient's husband was present at the patient's side when she lost consciousness at home.  He was helping her negotiate stairs and noted that she has been demonstrating increased exertional dyspnea over the past several days.  In fact, she underwent evaluation one week ago for concerns of "cracked ribs" although she had not experienced a fall.  In the ER today, she underwent CTA of the chest that revealed saddle pulmonary embolism, albeit with nearly (but not completely) occlusive clot burden.  She has been started on heparin and is currently requiring 100% NRB to maintain SpO2 88-94%.  The patinet's husband also reports that the patient has a hisotry of seizures, which have been well controlled with Keppra.  He describes previous episodes as "focal" with "rhythmic movements" and noted that this was not the case today.  She "just looked like she wasn't breathing".  She did not appear cyanotic, however.  REVIEW OF SYSTEMS Constitutional: No weight loss. No night sweats. No fever. No chills. No fatigue. HEENT: Worsening dysphagia.  Sore throat last week resolved. No headaches,  otalgia, nasal congestion, PND. CV:  No chest pain, orthopnea, PND, swelling in lower extremities, palpitations GI:  No abdominal pain, nausea, vomiting, diarrhea, change in bowel pattern, anorexia Resp: Pleuritic pain.  Exertional dyspnea.  Denies dyspnea at rest.  No cough, mucus, hemoptysis, wheezing  GU: no dysuria, change  in color of urine, no urgency or frequency.  No flank pain. MS:  No joint pain or swelling. No myalgias,  No decreased range of motion.  Psych:  Emotional lability on steroids.  No change in mood or affect. No memory loss. Skin: no rash or lesions.   Past Medical/Surgical/Social/Family History   Past Medical History:  Diagnosis Date  . Cervical spondylolysis 12/15/2019   Skeleton: Mild cervical spondylosis C6-7. Incidental find.   . Colon polyp   . GBM (glioblastoma multiforme) ([place])   . Left ankle sprain 10/07/2011  . Seizure ([place])    partial   . Tick bite 02/2020    Past Surgical History:  Procedure Laterality Date  . APPLICATION OF CRANIAL NAVIGATION Left 02/16/2020   Procedure: APPLICATION OF CRANIAL NAVIGATION;  Surgeon: Vallarie Mare, MD;  Location: [place];  Service: Neurosurgery;  Laterality: Left;  . CRANIOTOMY Left 02/16/2020   Procedure: LEFT FRONTAL CRANIOTOMY FOR BRAIN TUMOR;  Surgeon: Vallarie Mare, MD;  Location: [place];  Service: Neurosurgery;  Laterality: Left;  . FOOT SURGERY Left    bone spur - local anesthesia  . TONSILECTOMY/ADENOIDECTOMY WITH MYRINGOTOMY     tonsils and adenoids out only  . WISDOM TOOTH EXTRACTION      Social History   Tobacco Use  . Smoking status: Former Smoker    Years: 2.00    Types: Cigarettes    Quit date: 11/05/1979    Years since quitting: 41.1  . Smokeless tobacco: Never Used  . Tobacco comment: 1/2 pack a week  Substance Use Topics  . Alcohol use: Yes  Alcohol/week: 0.0 standard drinks    Comment: 1 glass of wine weekly    Family History  Problem Relation Age of Onset  . Hypertension Mother   . Stroke Mother   . Diabetes Mother   . Colon cancer Father 76  . Hypertension Sister   . Colon polyps Sister   . Heart disease Brother 31  . Diabetes Maternal Uncle   . Diabetes Maternal Uncle     Procedures:  N/A   Significant Diagnostic Tests:  2/10: Chest CTA shows saddle PE   Micro Data:   Results  for orders placed or performed in visit on 12/07/20  Novel Coronavirus, NAA ([facility])     Status: None   Collection Time: 12/07/20  4:11 PM   Specimen: Nasopharyngeal(NP) swabs in vial transport medium   Nasopharynge  Result Value Ref Range Status   SARS-CoV-2, NAA Not Detected Not Detected Final    Comment: This nucleic acid amplification test was developed and its performance characteristics determined by [employer]. Nucleic acid amplification tests include RT-PCR and TMA. This test has not been FDA cleared or approved. This test has been authorized by FDA under an Emergency Use Authorization (EUA). This test is only authorized for the duration of time the declaration that circumstances exist justifying the authorization of the emergency use of in vitro diagnostic tests for detection of SARS-CoV-2 virus and/or diagnosis of COVID-19 infection under section 564(b)(1) of the Act, 21 U.S.C. 053ZJQ-7(H) (1), unless the authorization is terminated or revoked sooner. When diagnostic testing is negative, the possibility of a false negative result should be considered in the context of a patient's recent exposures and the presence of clinical signs and symptoms consistent with COVID-19. An individual without symptoms of COVID-19 and who is not shedding SARS-CoV-2 virus wo uld expect to have a negative (not detected) result in this assay.   Upper Respiratory Culture     Status: None   Collection Time: 12/07/20  4:11 PM   Specimen: Throat  Result Value Ref Range Status   MICRO NUMBER: [id number]  Final   SPECIMEN QUALITY: Adequate  Final   Source THROAT  Final   STATUS: FINAL  Final   Result: No oropharyngeal pathogens recovered.  Final  SARS-COV-2, NAA 2 DAY TAT     Status: None   Collection Time: 12/07/20  4:11 PM   Nasopharynge  Result Value Ref Range Status   SARS-CoV-2, NAA 2 DAY TAT Performed  Final      Antimicrobials:  N/A    Interim history/subjective:   N/A   Objective   BP 90/72   Pulse (!) 115   Temp 99.4 F (37.4 C) (Rectal)   Resp 20   Ht 5\' 4"  (1.626 m)   Wt 58 kg   LMP 06/30/2015   SpO2 96%   BMI 21.95 kg/m     Filed Weights   12/14/20 2037  Weight: 58 kg   No intake or output data in the 24 hours ending 12/15/20 0011      Examination: GENERAL: alert, oriented to time, person and place, pleasant or well-developed. No acute distress. HEAD: normocephalic, atraumatic EYE: PERRLA, EOM intact, no scleral icterus, no pallor. NOSE: nares are patent. No polyps. No exudate.  THROAT/ORAL CAVITY: Normal dentition. No oral thrush. No exudate. Mucous membranes are moist. No tonsillar enlargement. NECK: supple, no thyromegaly, no JVD, no lymphadenopathy. Trachea midline. CHEST/LUNG: symmetric in development and expansion. Good air entry. No crackles. No wheezes. HEART: Regular S1 and S2  without murmur, rub or gallop. ABDOMEN: soft, nontender, nondistended. Normoactive bowel sounds. No rebound. No guarding. No hepatosplenomegaly. EXTREMITIES: Edema: none. No cyanosis. No clubbing. 2+ DP pulses LYMPHATIC: no cervical/axillary/inguinal lymph nodes appreciated MUSCULOSKELETAL: No point tenderness. No bulk atrophy. Joints: normal inspection.  SKIN:  No rash or lesion.Marland Kitchen NEUROLOGIC: Doll's eyes intact. Corneal reflex intact. Spontaneous respirations intact. Cranial nerves II-XII are grossly symmetric and physiologic. R Babinski. No sensory deficit. Motor: 5/5 @ RUE, 5/5 @ LUE, 5/5 @ RLL,  5/5 @ LLL.  DTR: 2+ @ R biceps, 2+ @ L biceps, 2+ @ R patellar,  2+ @ L patellar. No cerebellar signs. Gait was not assessed.   Resolved Hospital Problem list   N/A   Assessment & Plan:   ASSESSMENT/PLAN:  ASSESSMENT (included in the Hospital Problem List)  Principal Problem:   Acute hypoxemic respiratory failure (HCC) Active Problems:   GBM (glioblastoma multiforme) (HCC)   Pulmonary embolism (HCC)   Tachycardia   Seizure disorder ([place])    Steroid-induced hyperglycemia   DNR (do not resuscitate)   By systems: PULMONARY  Subacute pulmonary embolism, nonocclusive clot burden on imaging but with radiographic evidence suggesting right heart strain Heparin.  Will need to convert to [place].   CARDIOVASCULAR  Tachycardia IV fluids: NS 500 mL bolus followed by 60 mL/hr.   RENAL: No acute issues Monitor urine output   GASTROINTESTINAL  Swallowing dysfunction, concerning for a complication of her GBM  GI PROPHYLAXIS: Protonix Speech/swallow evaluation   HEMATOLOGIC  Normocytic anemia  DVT PROPHYLAXIS: heparin Monitor CBC.   INFECTIOUS: No acute issues   ENDOCRINE  Steroid-induced hyperglycemia Accuchecks & sliding scale insulin for now.   NEUROLOGIC  Recurrent, refractory glioblastoma multiforme  Seizure disorder Continue Keppra. EEG in am.   PLAN/RECOMMENDATIONS   Admit to ICU under my service (Attending: Renee Pain, MD) with the diagnoses highlighted above in the active Hospital Problem List (ASSESSMENT).  See above.    My assessment, plan of care, findings, medications, side effects, etc. were discussed with: nurse, patient (answered all questions to patient's satisfaction) and patient's husband at the bedside (updated, questions answered).   Best practice:  Diet: regular Pain/Anxiety/Delirium protocol (if indicated): N/A VAP protocol (if indicated): N/A DVT prophylaxis: heparin GI prophylaxis: Protonix Glucose control: SSI Mobility/Activity: as tolerated.   Code Status: DNR Family Communication:  patient's family (husband at bedside) Disposition: admit to ICU   Labs   CBC: Recent Labs  Lab 12/14/20 1207 12/14/20 2138 12/14/20 2145  WBC 7.8 11.4*  --   NEUTROABS 6.6 10.3*  --   HGB 13.7 12.1 11.9*  HCT 39.5 36.5 35.0*  MCV 94.7 99.7  --   PLT 137* 135*  --     Basic Metabolic Panel: Recent Labs  Lab 12/14/20 1207 12/14/20 2138 12/14/20 2145  NA 141 136 135  K 4.0  4.8 4.8  CL 104 102 102  CO2 26 20*  --   GLUCOSE 118* 280* 268*  BUN 13 21* 21*  CREATININE 0.87 1.18* 1.00  CALCIUM 9.4 8.3*  --   MG  --  2.2  --    GFR: Estimated Creatinine Clearance: 52.3 mL/min (by C-G formula based on SCr of 1 mg/dL). Recent Labs  Lab 12/14/20 1207 12/14/20 2138  WBC 7.8 11.4*    Liver Function Tests: Recent Labs  Lab 12/14/20 1207 12/14/20 2138  AST 13* 141*  ALT 9 92*  ALKPHOS 62 81  BILITOT 0.5 0.9  PROT 6.8 5.9*  ALBUMIN 3.5  3.0*   No results for input(s): LIPASE, AMYLASE in the last 168 hours. No results for input(s): AMMONIA in the last 168 hours.  ABG    Component Value Date/Time   PHART 7.390 02/16/2020 1355   PCO2ART 41.4 02/16/2020 1355   PO2ART 371.0 (H) 02/16/2020 1355   HCO3 25.3 02/16/2020 1355   TCO2 20 (L) 12/14/2020 2145   O2SAT 100.0 02/16/2020 1355     Coagulation Profile: Recent Labs  Lab 12/14/20 2138  INR 1.3*    Cardiac Enzymes: No results for input(s): CKTOTAL, CKMB, CKMBINDEX, TROPONINI in the last 168 hours.  HbA1C: Hemoglobin A1C  Date/Time Value Ref Range Status  10/07/2017 12:00 AM 5.1  Final   Hgb A1c MFr Bld  Date/Time Value Ref Range Status  08/02/2020 02:14 PM 5.6 4.6 - 6.5 % Final    Comment:    Glycemic Control Guidelines for People with Diabetes:Non Diabetic:  <6%Goal of Therapy: <7%Additional Action Suggested:  >8%   02/27/2015 08:18 AM 5.3 4.6 - 6.5 % Final    Comment:    Glycemic Control Guidelines for People with Diabetes:Non Diabetic:  <6%Goal of Therapy: <7%Additional Action Suggested:  >8%     CBG: No results for input(s): GLUCAP in the last 168 hours.   Past Medical History   Past Medical History:  Diagnosis Date  . Cervical spondylolysis 12/15/2019   Skeleton: Mild cervical spondylosis C6-7. Incidental find.   . Colon polyp   . GBM (glioblastoma multiforme) ([place])   . Left ankle sprain 10/07/2011  . Seizure ([address])    partial   . Tick bite 02/2020      Surgical  History    Past Surgical History:  Procedure Laterality Date  . APPLICATION OF CRANIAL NAVIGATION Left 02/16/2020   Procedure: APPLICATION OF CRANIAL NAVIGATION;  Surgeon: Vallarie Mare, MD;  Location: [place];  Service: Neurosurgery;  Laterality: Left;  . CRANIOTOMY Left 02/16/2020   Procedure: LEFT FRONTAL CRANIOTOMY FOR BRAIN TUMOR;  Surgeon: Vallarie Mare, MD;  Location: [place];  Service: Neurosurgery;  Laterality: Left;  . FOOT SURGERY Left    bone spur - local anesthesia  . TONSILECTOMY/ADENOIDECTOMY WITH MYRINGOTOMY     tonsils and adenoids out only  . WISDOM TOOTH EXTRACTION        Social History   Social History   Socioeconomic History  . Marital status: Married    Spouse name: Not on file  . Number of children: 3  . Years of education: Not on file  . Highest education level: Not on file  Occupational History  . Not on file  Tobacco Use  . Smoking status: Former Smoker    Years: 2.00    Types: Cigarettes    Quit date: 11/05/1979    Years since quitting: 41.1  . Smokeless tobacco: Never Used  . Tobacco comment: 1/2 pack a week  Vaping Use  . Vaping Use: Never used  Substance and Sexual Activity  . Alcohol use: Yes    Alcohol/week: 0.0 standard drinks    Comment: 1 glass of wine weekly  . Drug use: No  . Sexual activity: Yes    Birth control/protection: Surgical  Other Topics Concern  . Not on file  Social History Narrative  . Not on file   Social Determinants of Health   Financial Resource Strain: Not on file  Food Insecurity: Not on file  Transportation Needs: Not on file  Physical Activity: Not on file  Stress: Not on file  Social Connections: Not on file      Family History    Family History  Problem Relation Age of Onset  . Hypertension Mother   . Stroke Mother   . Diabetes Mother   . Colon cancer Father 30  . Hypertension Sister   . Colon polyps Sister   . Heart disease Brother 36  . Diabetes Maternal Uncle   . Diabetes Maternal  Uncle    family history includes Colon cancer (age of onset: 62) in her father; Colon polyps in her sister; Diabetes in her maternal uncle, maternal uncle, and mother; Heart disease (age of onset: 21) in her brother; Hypertension in her mother and sister; Stroke in her mother.    Allergies No Known Allergies    Current Medications  Current Facility-Administered Medications:  .  heparin ADULT infusion 100 units/mL (25000 units/211mL), 1,000 Units/hr, Intravenous, Continuous, Emiliano Dyer, RPH, Last Rate: 10 mL/hr at 12/14/20 2203, 1,000 Units/hr at 12/14/20 2203  Current Outpatient Medications:  .  cholecalciferol (VITAMIN D3) 25 MCG (1000 UNIT) tablet, Take 1,000 Units by mouth daily., Disp: , Rfl:  .  cyclobenzaprine (FLEXERIL) 10 MG tablet, Take 1 tablet (10 mg total) by mouth 3 (three) times daily as needed for muscle spasms., Disp: 30 tablet, Rfl: 0 .  dexamethasone (DECADRON) 1 MG tablet, Take 2 tablets (2 mg total) by mouth daily. (Patient taking differently: Take 8 mg by mouth daily.), Disp: 60 tablet, Rfl: 1 .  docusate sodium (COLACE) 100 MG capsule, Take 100 mg by mouth 2 (two) times daily as needed for mild constipation., Disp: , Rfl:  .  HYDROcodone-acetaminophen (NORCO/VICODIN) 5-325 MG tablet, Take 1 tablet by mouth every 6 (six) hours as needed., Disp: 20 tablet, Rfl: 0 .  levETIRAcetam (KEPPRA) 500 MG tablet, Take 1 tablet (500 mg total) by mouth 2 (two) times daily., Disp: 60 tablet, Rfl: 3 .  LORazepam (ATIVAN) 0.5 MG tablet, Place 1 tablet (0.5 mg total) under the tongue every 6 (six) hours as needed for anxiety. (Patient not taking: No sig reported), Disp: 45 tablet, Rfl: 0 .  ondansetron (ZOFRAN) 8 MG tablet, Take 1 tablet (8 mg total) by mouth 2 (two) times daily as needed (nausea and vomiting). May take 30-60 minutes prior to Temodar administration if nausea/vomiting occurs. (Patient not taking: No sig reported), Disp: 30 tablet, Rfl: 1 .  temozolomide (TEMODAR) 100  MG capsule, Take 1 capsule (100 mg total) by mouth daily. May take on an empty stomach to decrease nausea & vomiting., Disp: 42 capsule, Rfl: 0 .  temozolomide (TEMODAR) 20 MG capsule, Take 1 capsule (20 mg total) by mouth daily. May take on an empty stomach to decrease nausea & vomiting., Disp: 42 capsule, Rfl: 0   Home Medications  Prior to Admission medications   Medication Sig Start Date End Date Taking? Authorizing Provider  cholecalciferol (VITAMIN D3) 25 MCG (1000 UNIT) tablet Take 1,000 Units by mouth daily.   Yes [provider]  cyclobenzaprine (FLEXERIL) 10 MG tablet Take 1 tablet (10 mg total) by mouth 3 (three) times daily as needed for muscle spasms. 12/07/20  Yes Kuneff, Renee A, DO  dexamethasone (DECADRON) 1 MG tablet Take 2 tablets (2 mg total) by mouth daily. Patient taking differently: Take 8 mg by mouth daily. 11/09/20  Yes Vaslow, Acey Lav, MD  docusate sodium (COLACE) 100 MG capsule Take 100 mg by mouth 2 (two) times daily as needed for mild constipation.   Yes [provider]  HYDROcodone-acetaminophen (NORCO/VICODIN) 5-325 MG tablet Take 1 tablet by mouth every 6 (six) hours as needed. 12/07/20  Yes Kuneff, Renee A, DO  levETIRAcetam (KEPPRA) 500 MG tablet Take 1 tablet (500 mg total) by mouth 2 (two) times daily. 09/07/20  Yes Vaslow, Acey Lav, MD  LORazepam (ATIVAN) 0.5 MG tablet Place 1 tablet (0.5 mg total) under the tongue every 6 (six) hours as needed for anxiety. Patient not taking: No sig reported 10/03/20   Sandi Mealy E., PA-C  ondansetron (ZOFRAN) 8 MG tablet Take 1 tablet (8 mg total) by mouth 2 (two) times daily as needed (nausea and vomiting). May take 30-60 minutes prior to Temodar administration if nausea/vomiting occurs. Patient not taking: No sig reported 08/15/20   [name], MD  temozolomide (TEMODAR) 100 MG capsule Take 1 capsule (100 mg total) by mouth daily. May take on an empty stomach to decrease nausea & vomiting. 08/15/20    [name], MD  temozolomide (TEMODAR) 20 MG capsule Take 1 capsule (20 mg total) by mouth daily. May take on an empty stomach to decrease nausea & vomiting. 08/15/20   [name], MD      Critical care time: 60 minutes.  The treatment and management of the patient's condition was required based on the threat of imminent deterioration. This time reflects time spent by the physician evaluating, providing care and managing the critically ill patient's care. The time was spent at the immediate bedside (or on the same floor/unit and dedicated to this patient's care). Time involved in separately billable procedures is NOT included int he critical care time indicated above. Family meeting and update time may be included above if and only if the patient is unable/incompetent to participate in clinical interview and/or decision making, and the discussion was necessary to determining treatment decisions.   Renee Pain, MD Board Certified by the ABIM, [place]

## 2020-12-15 NOTE — Progress Notes (Addendum)
ANTICOAGULATION CONSULT NOTE  Pharmacy Consult for Heparin Indication: pulmonary embolus  No Known Allergies  Patient Measurements: Height: 5\' 4"  (162.6 cm) Weight: 58 kg (127 lb 13.9 oz) IBW/kg (Calculated) : 54.7 Heparin Dosing Weight: 58 kg  Vital Signs: Temp: 99.4 F (37.4 C) (02/11 1113) BP: 99/74 (02/11 1800) Pulse Rate: 76 (02/11 1800)  Labs: Recent Labs    12/14/20 1207 12/14/20 1207 12/14/20 2138 12/14/20 2145 12/15/20 0510 12/15/20 0740 12/15/20 1700  HGB 13.7   < > 12.1 11.9* 12.2  --   --   HCT 39.5  --  36.5 35.0* 37.0  --   --   PLT 137*  --  135*  --  145*  --   --   APTT  --   --  35  --   --  >200*  --   LABPROT  --   --  15.3*  --   --   --   --   INR  --   --  1.3*  --   --   --   --   HEPARINUNFRC  --   --   --   --   --  SUBOPTIMAL SPECIMEN 1.50*  CREATININE 0.87  --  1.18* 1.00 0.60  --   --   TROPONINIHS  --   --  149*  --   --   --   --    < > = values in this interval not displayed.    Estimated Creatinine Clearance: 65.4 mL/min (by C-G formula based on SCr of 0.6 mg/dL).   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  Pharmacy is consulted to dose IV heparin for saddle PE and DVT.  Patient is s/p PE thrombectomy and IVC filter placement today 12/15/20.  Spoke to Dr. Dwaine Gale, heparin 5000 units was given in IR, so heparin infusion was turned off as MD expected heparin level to be elevated.  Heparin level was checked about 1.5 hours after the bolus and it is elevated at 1.5 units/mL.  Okay to resume IV heparin per discussion with MD; however, will repeat a heparin level to make sure it is appropriate before resuming IV heparin.  No bleeding as of now per RN.  Goal of Therapy:  Heparin level 0.3-0.7 units/ml Monitor platelets by anticoagulation protocol: Yes   Plan:  Repeat heparin level at 2000 and restart IV heparin if appropriate  Naquan Garman D. Mina Marble, PharmD, BCPS, [place] 12/15/2020, 8:50  PM  ===========================  Addendum: Recheck heparin level slightly sub-therapeutic at 0.28 units/mL Restart heparin gtt at 800 units/hr - no bolus per MD Check 6 hr heparin level  Lillien Petronio D. Mina Marble, PharmD, BCPS, [place] 12/15/2020, 10:03 PM

## 2020-12-15 NOTE — ED Notes (Signed)
Patient received breakfast tray 

## 2020-12-15 NOTE — CV Procedure (Signed)
Echocardiogram not completed, patient undergoing other procedures. Will re-attempt at another time.  Darlina Sicilian RDCS

## 2020-12-15 NOTE — Progress Notes (Signed)
NAME:  Hannah [name] MRN:  [id number] DOB:  05-31-1961 LOS: 0 ADMISSION DATE:  12/14/2020 DATE OF SERVICE:  12/15/2020  CHIEF COMPLAINT:  Altered mental status; dyspnea   HISTORY & PHYSICAL  History of Present Illness  59 year old woman with glioblastoma multiforme a diagnosed in 2021, undergoing chemotherapy admitted with dyspnea for about 1 week, CT angiogram showing saddle PE. She is requiring nonrebreather and mildly hypotensive. Known seizure disorder on Keppra    Past Medical/Surgical/Social/Family History   Past Medical History:  Diagnosis Date  . Cervical spondylolysis 12/15/2019   Skeleton: Mild cervical spondylosis C6-7. Incidental find.   . Colon polyp   . GBM (glioblastoma multiforme) ([place])   . Left ankle sprain 10/07/2011  . Seizure ([place])    partial   . Tick bite 02/2020     Procedures:  N/A   Significant Diagnostic Tests:  2/10: Chest CTA shows saddle PE, RV/LV ratio 2.1   Micro Data:    Antimicrobials:  N/A    Interim history/subjective:   Soft blood pressure 80s, Denies headache. Complains of dyspnea, no chest pain   Objective   BP (!) 89/76   Pulse (!) 105   Temp 99.4 F (37.4 C) (Rectal)   Resp (!) 26   Ht 5\' 4"  (1.626 m)   Wt 58 kg   LMP 06/30/2015   SpO2 98%   BMI 21.95 kg/m     Filed Weights   12/14/20 2037  Weight: 58 kg    Intake/Output Summary (Last 24 hours) at 12/15/2020 1006 Last data filed at 12/15/2020 0100 Gross per 24 hour  Intake 1000 ml  Output --  Net 1000 ml        Examination: Gen. Pleasant, well-nourished, in no distress, anxious affect, on NRB ENT - no pallor,icterus, no post nasal drip Neck: No JVD, no thyromegaly, no carotid bruits Lungs: no use of accessory muscles, no dullness to percussion, clear without rales or rhonchi  Cardiovascular: Rhythm regular, heart sounds  normal, no murmurs or gallops, no peripheral edema Abdomen: soft and non-tender, no hepatosplenomegaly, BS  normal. Musculoskeletal: No deformities, no cyanosis or clubbing Neuro:  alert, non focal   Resolved Hospital Problem list   N/A   Assessment & Plan:   ASSESSMENT/PLAN:   Acute massive pulmonary embolism -She is hypotensive and requiring nonrebreather.  TPA is contraindicated due to brain tumor.  She is on IV heparin.  I discussed with patient risks and benefits of IR guided aspiration procedure in the setting.  She is willing to proceed.  I discussed with IR Dr. Dwaine Gale, she will be transferred to [facility] for this procedure.  I have called [facility] for transfer and PCCM team will assume care post procedure at [facility]. -Continue nonrebreather and IV heparin meantime. -Fluids for hypertension, if worse can add low-dose Levophed peripheral  Seizure disorder -EEG ordered by admitting physician. Continue Keppra   Best practice:  Diet: regular Pain/Anxiety/Delirium protocol (if indicated): N/A VAP protocol (if indicated): N/A DVT prophylaxis: heparin GI prophylaxis: Protonix Glucose control: SSI Mobility/Activity: as tolerated.   Code Status: DNR Family Communication: Patient and daughter at bedside Disposition: admit to ICU   Labs   CBC: Recent Labs  Lab 12/14/20 1207 12/14/20 2138 12/14/20 2145 12/15/20 0510  WBC 7.8 11.4*  --  7.0  NEUTROABS 6.6 10.3*  --   --   HGB 13.7 12.1 11.9* 12.2  HCT 39.5 36.5 35.0* 37.0  MCV 94.7 99.7  --  99.7  PLT 137* 135*  --  145*    Basic Metabolic Panel: Recent Labs  Lab 12/14/20 1207 12/14/20 2138 12/14/20 2145 12/15/20 0510  NA 141 136 135 143  K 4.0 4.8 4.8 3.8  CL 104 102 102 113*  CO2 26 20*  --  19*  GLUCOSE 118* 280* 268* 124*  BUN 13 21* 21* 18  CREATININE 0.87 1.18* 1.00 0.60  CALCIUM 9.4 8.3*  --  7.7*  MG  --  2.2  --  1.9   GFR: Estimated Creatinine Clearance: 65.4 mL/min (by C-G formula based on SCr of 0.6 mg/dL). Recent Labs  Lab 12/14/20 1207 12/14/20 2138 12/15/20 0510  WBC 7.8 11.4* 7.0    Liver  Function Tests: Recent Labs  Lab 12/14/20 1207 12/14/20 2138  AST 13* 141*  ALT 9 92*  ALKPHOS 62 81  BILITOT 0.5 0.9  PROT 6.8 5.9*  ALBUMIN 3.5 3.0*   No results for input(s): LIPASE, AMYLASE in the last 168 hours. No results for input(s): AMMONIA in the last 168 hours.  ABG    Component Value Date/Time   PHART 7.390 02/16/2020 1355   PCO2ART 41.4 02/16/2020 1355   PO2ART 371.0 (H) 02/16/2020 1355   HCO3 25.3 02/16/2020 1355   TCO2 20 (L) 12/14/2020 2145   O2SAT 100.0 02/16/2020 1355     Additional critical care time x 40 minutes  Patient critically ill due to saddle PE Interventions to address this today IR procedure, IV heparin Risk of deterioration without these interventions is high  I personally spent 40 minutes providing critical care not including any separately billable procedures  Kara Mead MD. FCCP. [facility] Pager : [phone]  If no response to pager , please call [phone] until 7 pm After 7:00 pm call Elink  [phone]   12/15/2020

## 2020-12-15 NOTE — Progress Notes (Signed)
EEG Completed; Results Pending  

## 2020-12-15 NOTE — Progress Notes (Signed)
SLP Cancellation Note  Patient Details Name: Hannah [name] MRN: [id number] DOB: 06/29/1961   Cancelled treatment:        Pt not in room. A diet has been started. Suspect that SLP eval may not be needed at this time. Will f/u with RN when she is available.    Charrise Lardner, Katherene Ponto 12/15/2020, 1:23 PM

## 2020-12-15 NOTE — Transfer of Care (Signed)
Immediate Anesthesia Transfer of Care Note  Patient: Kaeli A Czarnecki  Procedure(s) Performed: IR WITH ANESTHESIA (N/A )  Patient Location: ICU  Anesthesia Type:MAC  Level of Consciousness: awake, alert , oriented and patient cooperative  Airway & Oxygen Therapy: Patient Spontanous Breathing and Patient connected to face mask  Post-op Assessment: Report given to RN, Post -op Vital signs reviewed and stable and Patient moving all extremities  Post vital signs: Reviewed and stable  Last Vitals:  Vitals Value Taken Time  BP 101/66   Temp    Pulse 76 12/15/20 1744  Resp 15 12/15/20 1744  SpO2 98 % 12/15/20 1744  Vitals shown include unvalidated device data.  Last Pain:  Vitals:   12/14/20 2053  TempSrc: Rectal  PainSc:          Complications: No complications documented.

## 2020-12-15 NOTE — Progress Notes (Signed)
PCCM Interval Note  S: 60 year old woman with glioblastoma multiforme on chemotherapy, history of seizures, admitted to [facility] with dyspnea, hypoxemic respiratory failure and a large saddle pulmonary embolism.  She is on 1.00 NRB mask.   O: Vitals:   12/15/20 0930 12/15/20 1000 12/15/20 1113 12/15/20 1211  BP: (!) 89/78 100/64 92/81 99/82   Pulse: (!) 102 (!) 101 (!) 111 (!) 104  Resp: (!) 22 (!) 22 (!) 21 (!) 23  Temp:   99.4 F (37.4 C)   TempSrc:      SpO2: 97% 100% 97% 95%  Weight:      Height:      Comfortable with nonrebreather mask in place.  Oropharynx clear, no stridor.  Lungs clear bilaterally without any wheezes or crackles.  Heart is regular, tachycardic without a murmur.  Abdomen is nondistended with positive bowel sounds.  No significant lower extremity edema.  Acute massive pulmonary embolism with relative hypotension and hypoxemic respiratory failure.  She cannot have thrombolytics due to her known malignancy.  She is tolerating IV heparin.  Recommendation has been made for possible thrombectomy clot removal in IR.  She transfers now for this procedure.  IV heparin will be restarted now post transport.  She will be going to IR soon.  Shock associated with large pulmonary embolism.  Responding to IV fluids.  Will add low-dose norepinephrine via peripheral IV if needed.  Seizure disorder.  EEG pending.  Continue her Keppra.  Independent CC time 30 minutes  Baltazar Apo, MD, PhD 12/15/2020, 1:21 PM [facility] [phone] or if no answer before 7:00PM call [phone] For any issues after 7:00PM please call eLink [phone]

## 2020-12-16 ENCOUNTER — Other Ambulatory Visit: Payer: Self-pay | Admitting: Radiology

## 2020-12-16 ENCOUNTER — Inpatient Hospital Stay (HOSPITAL_COMMUNITY): Payer: 59

## 2020-12-16 DIAGNOSIS — I2602 Saddle embolus of pulmonary artery with acute cor pulmonale: Secondary | ICD-10-CM

## 2020-12-16 DIAGNOSIS — I2699 Other pulmonary embolism without acute cor pulmonale: Secondary | ICD-10-CM

## 2020-12-16 DIAGNOSIS — C719 Malignant neoplasm of brain, unspecified: Secondary | ICD-10-CM

## 2020-12-16 DIAGNOSIS — J9601 Acute respiratory failure with hypoxia: Secondary | ICD-10-CM | POA: Diagnosis not present

## 2020-12-16 LAB — CBC
HCT: 27.6 % — ABNORMAL LOW (ref 36.0–46.0)
HCT: 28.7 % — ABNORMAL LOW (ref 36.0–46.0)
Hemoglobin: 9.2 g/dL — ABNORMAL LOW (ref 12.0–15.0)
Hemoglobin: 9.6 g/dL — ABNORMAL LOW (ref 12.0–15.0)
MCH: 32.5 pg (ref 26.0–34.0)
MCH: 32.7 pg (ref 26.0–34.0)
MCHC: 33.3 g/dL (ref 30.0–36.0)
MCHC: 33.4 g/dL (ref 30.0–36.0)
MCV: 97.5 fL (ref 80.0–100.0)
MCV: 97.6 fL (ref 80.0–100.0)
Platelets: 117 10*3/uL — ABNORMAL LOW (ref 150–400)
Platelets: 125 10*3/uL — ABNORMAL LOW (ref 150–400)
RBC: 2.83 MIL/uL — ABNORMAL LOW (ref 3.87–5.11)
RBC: 2.94 MIL/uL — ABNORMAL LOW (ref 3.87–5.11)
RDW: 11.9 % (ref 11.5–15.5)
RDW: 12 % (ref 11.5–15.5)
WBC: 6.1 10*3/uL (ref 4.0–10.5)
WBC: 7.9 10*3/uL (ref 4.0–10.5)
nRBC: 0 % (ref 0.0–0.2)
nRBC: 0 % (ref 0.0–0.2)

## 2020-12-16 LAB — GLUCOSE, CAPILLARY
Glucose-Capillary: 92 mg/dL (ref 70–99)
Glucose-Capillary: 86 mg/dL (ref 70–99)
Glucose-Capillary: 78 mg/dL (ref 70–99)

## 2020-12-16 LAB — BRAIN NATRIURETIC PEPTIDE: B Natriuretic Peptide: 150.1 pg/mL — ABNORMAL HIGH (ref 0.0–100.0)

## 2020-12-16 LAB — ECHOCARDIOGRAM COMPLETE
Area-P 1/2: 3.48 cm2
Height: 64 in
S' Lateral: 2.4 cm
Weight: 2123.47 oz

## 2020-12-16 LAB — LACTIC ACID, PLASMA: Lactic Acid, Venous: 2.6 mmol/L (ref 0.5–1.9)

## 2020-12-16 LAB — HEPARIN LEVEL (UNFRACTIONATED)
Heparin Unfractionated: 0.3 IU/mL (ref 0.30–0.70)
Heparin Unfractionated: 0.17 IU/mL — ABNORMAL LOW (ref 0.30–0.70)

## 2020-12-16 LAB — TROPONIN I (HIGH SENSITIVITY): Troponin I (High Sensitivity): 149 ng/L (ref <18)

## 2020-12-16 MED ORDER — LACTATED RINGERS IV BOLUS
250.0000 mL | Freq: Once | INTRAVENOUS | Status: AC
  Administered 2020-12-16: 250 mL via INTRAVENOUS

## 2020-12-16 MED ORDER — PROSOURCE PLUS PO LIQD
30.0000 mL | Freq: Two times a day (BID) | ORAL | Status: DC
  Administered 2020-12-16 – 2020-12-17 (×2): 30 mL via ORAL
  Filled 2020-12-16 (×7): qty 30

## 2020-12-16 MED ORDER — ACETAMINOPHEN 80 MG PO CHEW
320.0000 mg | CHEWABLE_TABLET | Freq: Four times a day (QID) | ORAL | Status: DC | PRN
  Filled 2020-12-16: qty 4

## 2020-12-16 MED ORDER — ENSURE MAX PROTEIN PO LIQD
11.0000 [oz_av] | Freq: Two times a day (BID) | ORAL | Status: DC
  Administered 2020-12-16 (×2): 11 [oz_av] via ORAL
  Filled 2020-12-16 (×10): qty 330

## 2020-12-16 MED ORDER — ACETAMINOPHEN 80 MG PO CHEW
80.0000 mg | CHEWABLE_TABLET | Freq: Four times a day (QID) | ORAL | Status: DC | PRN
  Filled 2020-12-16: qty 1

## 2020-12-16 MED ORDER — MAGNESIUM SULFATE 2 GM/50ML IV SOLN
2.0000 g | Freq: Once | INTRAVENOUS | Status: AC
  Administered 2020-12-16: 2 g via INTRAVENOUS
  Filled 2020-12-16: qty 50

## 2020-12-16 NOTE — Evaluation (Addendum)
Clinical/Bedside Swallow Evaluation Patient Details  Name: Hannah [name] MRN: [id number] Date of Birth: 1961/02/18  Today's Date: 12/16/2020 Time: SLP Start Time (ACUTE ONLY): 1258 SLP Stop Time (ACUTE ONLY): 1320 SLP Time Calculation (min) (ACUTE ONLY): 22 min  Past Medical History:  Past Medical History:  Diagnosis Date  . Cervical spondylolysis 12/15/2019   Skeleton: Mild cervical spondylosis C6-7. Incidental find.   . Colon polyp   . GBM (glioblastoma multiforme) ([name])   . Left ankle sprain 10/07/2011  . Seizure ([place])    partial   . Tick bite 02/2020   Past Surgical History:  Past Surgical History:  Procedure Laterality Date  . APPLICATION OF CRANIAL NAVIGATION Left 02/16/2020   Procedure: APPLICATION OF CRANIAL NAVIGATION;  Surgeon: Vallarie Mare, MD;  Location: [place];  Service: Neurosurgery;  Laterality: Left;  . CRANIOTOMY Left 02/16/2020   Procedure: LEFT FRONTAL CRANIOTOMY FOR BRAIN TUMOR;  Surgeon: Vallarie Mare, MD;  Location: [place];  Service: Neurosurgery;  Laterality: Left;  . FOOT SURGERY Left    bone spur - local anesthesia  . IR ANGIOGRAM PULMONARY BILATERAL SELECTIVE  12/15/2020  . IR ANGIOGRAM SELECTIVE EACH ADDITIONAL VESSEL  12/15/2020  . IR ANGIOGRAM SELECTIVE EACH ADDITIONAL VESSEL  12/15/2020  . IR IVC FILTER PLMT / S&I /IMG GUID/MOD SED  12/15/2020  . IR THROMBECT PRIM MECH INIT (INCLU) MOD SED  12/15/2020  . IR US GUIDE VASC ACCESS RIGHT  12/15/2020  . TONSILECTOMY/ADENOIDECTOMY WITH MYRINGOTOMY     tonsils and adenoids out only  . WISDOM TOOTH EXTRACTION     HPI:  [name] presented to [name] with complaints of acute alteration in mental status following a course of rapidly progressive worsening dyspnea over the past 6 days.  She has a known history of recurrent glioblastoma multiforme. CTA of the chest revealed saddle pulmonary embolism, albeit with nearly occlusive clot burden.   Assessment /  Plan / Recommendation Clinical Impression   After being screened for and deferring bedside swallow evaluation this morning, SLP was called back to pt's room given pt's preference to proceed with bedside swallow eval due to reports of difficulty swallowing pills at home.  Pt presents with a mild dysphagia specific to meats, nuts, and steroid pills.  Pt reports her decadron pill gets stuck on her tongue in the morning and it takes her a long time to eventually swallow.  She reports that at times she is unable to swallow it and she has to chew it to get it down.  She complains of globus sensation with meats and nuts and requires lots of water to get those down.   No overt s/s of aspiration were evident with any solids or liquids assessed today and pt's oral phase was efficient for clearing boluses from the oral cavity.  Pt could verbalize precautions that she has taken to mitigate the abovementioned complaints with good effect (drinking lots of water during meals, not taking her decadron tablet first thing in the morning but rather after eating breakfast).  SLP suspects that xerostomia is at least partially contributing to her dysphagia with possibly an underlying esophageal component due to complaints of globus sensation.   As a result, SLP provided skilled education regarding ways to mitigate symptoms (smaller more frequent meals, alternating between solids and liquids during meals, sitting upright for 30-60 min after a meal, products to improve moisture of oral mucosa).  All questions were answered to pt's and husband's  satisfaction at this time.  Given that pt is already independently managing her symptoms with good effect, no further ST needs are indicated at this time.    SLP Visit Diagnosis: Dysphagia, unspecified (R13.10)    Aspiration Risk  Mild aspiration risk    Diet Recommendation Regular;Thin liquid   Liquid Administration via: Cup;Straw Medication Administration: Whole meds with  liquid Supervision: Patient able to self feed Compensations: Minimize environmental distractions;Slow rate;Small sips/bites;Follow solids with liquid Postural Changes: Seated upright at 90 degrees;Remain upright for at least 30 minutes after po intake    Other  Recommendations Recommended Consults: Consider esophageal assessment Oral Care Recommendations: Oral care BID   Follow up Recommendations None      Frequency and Duration            Prognosis Prognosis for Safe Diet Advancement: Good      Swallow Study   General HPI: [name] presented to the [name] with complaints of acute alteration in mental status following a course of rapidly progressive worsening dyspnea over the past 6 days.  She has a known history of recurrent glioblastoma multiforme. CTA of the chest revealed saddle pulmonary embolism, albeit with nearly occlusive clot burden. Type of Study: Bedside Swallow Evaluation Previous Swallow Assessment: 02/2020 Diet Prior to this Study: Regular;Thin liquids Temperature Spikes Noted: No Respiratory Status: Nasal cannula History of Recent Intubation: No Behavior/Cognition: Alert;Cooperative;Pleasant mood Oral Cavity Assessment: Dry Oral Care Completed by SLP: No Oral Cavity - Dentition: Adequate natural dentition Vision: Functional for self-feeding Self-Feeding Abilities: Able to feed self Patient Positioning: Upright in bed Baseline Vocal Quality: Normal Volitional Cough: Strong Volitional Swallow: Able to elicit    Oral/Motor/Sensory Function Overall Oral Motor/Sensory Function: Within functional limits   Ice Chips     Thin Liquid Thin Liquid: Within functional limits    Nectar Thick     Honey Thick     Puree Puree: Within functional limits   Solid     Solid: Within functional limits      Hannah [name] L 12/16/2020,1:37 PM

## 2020-12-16 NOTE — Progress Notes (Signed)
Referring Physician(s): Dr. Franco Collet  Supervising Physician: Daryll Brod  Patient Status:  [facility] - In-pt  Chief Complaint:  Saddle PE with right heart strain s/p pulmonary thrombectomy via Inari and IVC filter placement  Subjective:  Currently without any significant complaints. Patient alert and laying in bed, calm and comfortable. Ms. Stobaugh reports improvement in breathing compared to Thursday. She remains on 2L of O2 via [place] at this time. She endorses tenderness at right femoral access site but states condition is made better with slight elevation and an ice pack.    Allergies: Patient has no known allergies.  Medications: Prior to Admission medications   Medication Sig Start Date End Date Taking? Authorizing Provider  cholecalciferol (VITAMIN D3) 25 MCG (1000 UNIT) tablet Take 1,000 Units by mouth daily.   Yes [provider]  cyclobenzaprine (FLEXERIL) 10 MG tablet Take 1 tablet (10 mg total) by mouth 3 (three) times daily as needed for muscle spasms. 12/07/20  Yes Kuneff, Renee A, DO  dexamethasone (DECADRON) 1 MG tablet Take 2 tablets (2 mg total) by mouth daily. Patient taking differently: Take 8 mg by mouth daily. 11/09/20  Yes Vaslow, Acey Lav, MD  docusate sodium (COLACE) 100 MG capsule Take 100 mg by mouth 2 (two) times daily as needed for mild constipation.   Yes [provider]  HYDROcodone-acetaminophen (NORCO/VICODIN) 5-325 MG tablet Take 1 tablet by mouth every 6 (six) hours as needed. 12/07/20  Yes Kuneff, Renee A, DO  levETIRAcetam (KEPPRA) 500 MG tablet Take 1 tablet (500 mg total) by mouth 2 (two) times daily. 09/07/20  Yes Vaslow, Acey Lav, MD  LORazepam (ATIVAN) 0.5 MG tablet Place 1 tablet (0.5 mg total) under the tongue every 6 (six) hours as needed for anxiety. Patient not taking: No sig reported 10/03/20   Sandi Mealy E., PA-C  ondansetron (ZOFRAN) 8 MG tablet Take 1 tablet (8 mg total) by mouth 2 (two) times daily as needed (nausea and  vomiting). May take 30-60 minutes prior to Temodar administration if nausea/vomiting occurs. Patient not taking: No sig reported 08/15/20   [name], MD  temozolomide (TEMODAR) 100 MG capsule Take 1 capsule (100 mg total) by mouth daily. May take on an empty stomach to decrease nausea & vomiting. 08/15/20   [name], MD  temozolomide (TEMODAR) 20 MG capsule Take 1 capsule (20 mg total) by mouth daily. May take on an empty stomach to decrease nausea & vomiting. 08/15/20   [name], MD     Vital Signs: BP 112/78   Pulse 78   Temp 98.2 F (36.8 C) (Oral)   Resp (!) 21   Ht 5\' 4"  (1.626 m)   Wt 132 lb 11.5 oz (60.2 kg)   LMP 06/30/2015   SpO2 97%   BMI 22.78 kg/m   Physical Exam Vitals and nursing note reviewed.  Constitutional:      Appearance: She is well-developed and well-nourished.  HENT:     Head: Normocephalic and atraumatic.  Eyes:     Conjunctiva/sclera: Conjunctivae normal.  Cardiovascular:     Rate and Rhythm: Normal rate and regular rhythm.  Pulmonary:     Effort: Pulmonary effort is normal.     Comments: On 2 liter O2 via [place]. Musculoskeletal:     Cervical back: Normal range of motion.  Neurological:     Mental Status: She is alert and oriented to person, place, and time.  Psychiatric:        Mood  and Affect: Mood and affect normal.     Imaging: CT Head Wo Contrast  Result Date: 12/14/2020 CLINICAL DATA:  60 year old female with syncope, seizure, and abnormal neurological exam. Known glioblastoma undergoing chemo and radiation treatment. EXAM: CT HEAD WITHOUT CONTRAST TECHNIQUE: Contiguous axial images were obtained from the base of the skull through the vertex without intravenous contrast. COMPARISON:  Brain MRI dated 11/02/2020. FINDINGS: Brain: Ill-defined 3.8 x 2.3 cm low attenuating area centered at anterior corpus callosum and crossing the midline correspond to the necrotic mass seen on the prior MRI. There is interval development  of moderate amount of white matter edema predominantly involving the frontal lobes, increased since the prior MRI. There is approximately 4 mm right to left midline shift (measured at the level of the foramen Monro). Ill-defined area of higher attenuation involving the left frontal convexity (23/2) may represent volume averaging from adjacent cortices or additional mass. MRI without and with contrast is recommended for better evaluation of the mass and comparison with prior MRI images. There is no acute intracranial hemorrhage. Vascular: No hyperdense vessel or unexpected calcification. Skull: Left frontal craniotomy.  No acute calvarial pathology. Sinuses/Orbits: No acute finding. Other: None IMPRESSION: 1. Increase in the degree of edema with 4 mm right to left midline shift. MRI without and with contrast may provide better evaluation. 2. No acute intracranial hemorrhage. These results were called by telephone at the time of interpretation on 12/14/2020 at 9:42 pm to provider [facility] , who verbally acknowledged these results. Electronically Signed   By: Anner Crete M.D.   On: 12/14/2020 21:49   CT Angio Chest PE W/Cm &/Or Wo Cm  Result Date: 12/14/2020 CLINICAL DATA:  History of brain tumor shortness of breath EXAM: CT ANGIOGRAPHY CHEST WITH CONTRAST TECHNIQUE: Multidetector CT imaging of the chest was performed using the standard protocol during bolus administration of intravenous contrast. Multiplanar CT image reconstructions and MIPs were obtained to evaluate the vascular anatomy. CONTRAST:  173mL OMNIPAQUE IOHEXOL 350 MG/ML SOLN COMPARISON:  None. FINDINGS: Cardiovascular: There is a optimal opacification of the pulmonary arteries. There is partially occlusive thrombus within the central central bifurcation of the main pulmonary arteries with nearly occlusive thrombus in the bilateral segmental and subsegmental arterial branches throughout both lungs. There is evidence of right ventricular heart  strain, RV/LV ratio equals 2.1 there is a small pericardial effusion present. There is normal three-vessel brachiocephalic anatomy without proximal stenosis. Scattered mild aortic atherosclerosis is seen. Mediastinum/Nodes: No hilar, mediastinal, or axillary adenopathy. Thyroid gland, trachea, and esophagus demonstrate no significant findings. Lungs/Pleura: A small left pleural effusion with adjacent patchy airspace opacity at the left lung base, likely atelectasis is noted. Upper Abdomen: No acute abnormalities present in the visualized portions of the upper abdomen. Hree focus of contrast is seen within the IVC, likely consistent with right heart strain. Musculoskeletal: No chest wall abnormality. No acute or significant osseous findings. Review of the MIP images confirms the above findings. IMPRESSION: Partially occlusive saddle emboli with nearly occlusive thrombus within both lungs throughout the segmental and subsegmental arterial branches. CTevidence of right heart strain (RV/LV Ratio = 2.1) consistent with at least submassive (intermediate risk) PE. The presence of right heart strain has been associated with an increased risk of morbidity and mortality. Small pericardial effusion Small left pleural effusion with basilar atelectasis These results were called by telephone at the time of interpretation on 12/14/2020 at 9:42 pm to provider [facility] , who verbally acknowledged these results. Electronically Signed  By: Prudencio Pair M.D.   On: 12/14/2020 21:44   IR Angiogram Pulmonary Bilateral Selective  Result Date: 12/15/2020 INDICATION: 60 year old woman with sub massive pulmonary artery emboli with RV LV ratio of 3.4. Patient has a history of GBM and cannot receive tPA. Prior to the procedure patient was on non-rebreather at 15 L, with oxygen saturation of 93%. Interventional radiology consulted for catheter directed thrombectomy of pulmonary embolism. IVC filter also requested given history of lower  extremity DVT and GBM. EXAM: 1. Ultrasound-guided access of right common femoral vein 2. Bilateral pulmonary venogram 3. Bilateral pulmonary suction thrombectomy 4. IVC filter placement COMPARISON:  CT angiography chest 12/14/2020 MEDICATIONS: None. ANESTHESIA/SEDATION: None. The patient was monitored by the anesthesia team throughout the procedure given their tenuous cardiovascular status. FLUOROSCOPY TIME:  Fluoroscopy Time: 53 minutes 42 seconds (132 mGy). COMPLICATIONS: None immediate. TECHNIQUE: Informed written consent was obtained from the patient after a thorough discussion of the procedural risks, benefits and alternatives. All questions were addressed. Maximal Sterile Barrier Technique was utilized including caps, mask, sterile gowns, sterile gloves, sterile drape, hand hygiene and skin antiseptic. A timeout was performed prior to the initiation of the procedure. Right groin skin prepped and draped in usual fashion. The right common femoral vein was evaluated with ultrasound and shown to be patent. A permanent ultrasound image was obtained and placed in the patient's medical record. Using sterile gel and a sterile probe cover the right common femoral vein was entered with a 21 gauge needle during real time ultrasound guidance. 21 gauge needle exchanged for transitional dilator set over 0.018 in guidewire. Transitional dilator set exchanged for 6 French sheath over 0.035 in guidewire. Despite multiple attempts with single and double angle pigtail catheter, the pulmonary artery could not be accessed. The short 6 French sheath was exchanged for a 90 cm 7 [place] sheath. The tip of the sheath position just below the cavoatrial junction. The long 7 French sheath allowed for additional stability and the main pulmonary artery was accessed with a 6 [place] double angle pigtail catheter. Pre thrombectomy pulmonary artery pressure: 54/31 mmHg (mean 41 mmHg) Double angled pigtail catheter removed over exchange length  Amplatz guidewire. Serial dilation was performed and 7 [place] sheath exchanged for [employer] 24 French sheath. Utilizing a Bern catheter, the guidewire was directed into a right lower lobe pulmonary artery branch. 61 [place] FlowTriver device was advanced over the wire into the lateral basilar segmental and Truncus Anterior branches of the right pulmonary arterial system. Mechanical aspiration thrombectomy was performed extirpating thrombus. Post thrombectomy pulmonary angiogram demonstrated significant reduction in overall clot burden in the right pulmonary arterial branches. The left lateral basilar segmental branch of the lower lobe artery was selected with the burn catheter. The 20 [place] FlowTriver device was advanced over the wire into the lateral basilar segmental branches of the left pulmonary arterial system. No significant thrombus could be removed with the 20 [place] device. The 20 French device was removed and the 24 [place] FlowTriver device was inserted. The tip was advanced into the main left pulmonary artery. The T20 FlowTriver device was then inserted through the 24 French device into the lateral basilar segment, and mechanical aspiration thrombectomy was performed extirpating thrombus. Post aspiration pulmonary angiogram demonstrated significant reduction in overall clot burden. Prior thrombectomy, the patient's blood pressure measured 87/67 mm Hg with a heart rate of 107 beats per minute. Post thrombectomy, pressures improved to 117/64 mm Hg with heart rate of 81 beats per minute.  Patient's oxygenation improved from 93% on 15 L by non-rebreather 200% on 10 L by non-rebreather. FlowTriever device was removed. Bern catheter was advanced through the cord dry seal to the infrarenal inferior vena cava and venogram was performed in order to delineate the origin of the renal veins. Bern catheter was removed over a guidewire and Denali IVC filter was deployed in the infrarenal vena cava. The [place]  Dry Seal sheath was removed and hemostasis achieved with purse-string suture and manual compression. IMPRESSION: 1. Bilateral pulmonary angiogram and Inari sunction thrombectomy as above. 2. Denali IVC filter placed. Electronically Signed   By: Miachel Roux M.D.   On: 12/15/2020 18:38   IR Angiogram Selective Each Additional Vessel  Result Date: 12/15/2020 INDICATION: 60 year old woman with sub massive pulmonary artery emboli with RV LV ratio of 3.4. Patient has a history of GBM and cannot receive tPA. Prior to the procedure patient was on non-rebreather at 15 L, with oxygen saturation of 93%. Interventional radiology consulted for catheter directed thrombectomy of pulmonary embolism. IVC filter also requested given history of lower extremity DVT and GBM. EXAM: 1. Ultrasound-guided access of right common femoral vein 2. Bilateral pulmonary venogram 3. Bilateral pulmonary suction thrombectomy 4. IVC filter placement COMPARISON:  CT angiography chest 12/14/2020 MEDICATIONS: None. ANESTHESIA/SEDATION: None. The patient was monitored by the anesthesia team throughout the procedure given their tenuous cardiovascular status. FLUOROSCOPY TIME:  Fluoroscopy Time: 53 minutes 42 seconds (132 mGy). COMPLICATIONS: None immediate. TECHNIQUE: Informed written consent was obtained from the patient after a thorough discussion of the procedural risks, benefits and alternatives. All questions were addressed. Maximal Sterile Barrier Technique was utilized including caps, mask, sterile gowns, sterile gloves, sterile drape, hand hygiene and skin antiseptic. A timeout was performed prior to the initiation of the procedure. Right groin skin prepped and draped in usual fashion. The right common femoral vein was evaluated with ultrasound and shown to be patent. A permanent ultrasound image was obtained and placed in the patient's medical record. Using sterile gel and a sterile probe cover the right common femoral vein was entered with a  21 gauge needle during real time ultrasound guidance. 21 gauge needle exchanged for transitional dilator set over 0.018 in guidewire. Transitional dilator set exchanged for 6 French sheath over 0.035 in guidewire. Despite multiple attempts with single and double angle pigtail catheter, the pulmonary artery could not be accessed. The short 6 French sheath was exchanged for a 90 cm 7 [place] sheath. The tip of the sheath position just below the cavoatrial junction. The long 7 French sheath allowed for additional stability and the main pulmonary artery was accessed with a 6 [place] double angle pigtail catheter. Pre thrombectomy pulmonary artery pressure: 54/31 mmHg (mean 41 mmHg) Double angled pigtail catheter removed over exchange length Amplatz guidewire. Serial dilation was performed and 7 [place] sheath exchanged for [employer] 24 French sheath. Utilizing a Bern catheter, the guidewire was directed into a right lower lobe pulmonary artery branch. 73 [place] FlowTriver device was advanced over the wire into the lateral basilar segmental and Truncus Anterior branches of the right pulmonary arterial system. Mechanical aspiration thrombectomy was performed extirpating thrombus. Post thrombectomy pulmonary angiogram demonstrated significant reduction in overall clot burden in the right pulmonary arterial branches. The left lateral basilar segmental branch of the lower lobe artery was selected with the burn catheter. The 20 [place] FlowTriver device was advanced over the wire into the lateral basilar segmental branches of the left pulmonary arterial system. No significant  thrombus could be removed with the 20 [place] device. The 20 French device was removed and the 24 [place] FlowTriver device was inserted. The tip was advanced into the main left pulmonary artery. The T20 FlowTriver device was then inserted through the 24 French device into the lateral basilar segment, and mechanical aspiration thrombectomy was  performed extirpating thrombus. Post aspiration pulmonary angiogram demonstrated significant reduction in overall clot burden. Prior thrombectomy, the patient's blood pressure measured 87/67 mm Hg with a heart rate of 107 beats per minute. Post thrombectomy, pressures improved to 117/64 mm Hg with heart rate of 81 beats per minute. Patient's oxygenation improved from 93% on 15 L by non-rebreather 200% on 10 L by non-rebreather. FlowTriever device was removed. Bern catheter was advanced through the cord dry seal to the infrarenal inferior vena cava and venogram was performed in order to delineate the origin of the renal veins. Bern catheter was removed over a guidewire and Denali IVC filter was deployed in the infrarenal vena cava. The [place] Dry Seal sheath was removed and hemostasis achieved with purse-string suture and manual compression. IMPRESSION: 1. Bilateral pulmonary angiogram and Inari sunction thrombectomy as above. 2. Denali IVC filter placed. Electronically Signed   By: Miachel Roux M.D.   On: 12/15/2020 18:38   IR Angiogram Selective Each Additional Vessel  Result Date: 12/15/2020 INDICATION: 61 year old woman with sub massive pulmonary artery emboli with RV LV ratio of 3.4. Patient has a history of GBM and cannot receive tPA. Prior to the procedure patient was on non-rebreather at 15 L, with oxygen saturation of 93%. Interventional radiology consulted for catheter directed thrombectomy of pulmonary embolism. IVC filter also requested given history of lower extremity DVT and GBM. EXAM: 1. Ultrasound-guided access of right common femoral vein 2. Bilateral pulmonary venogram 3. Bilateral pulmonary suction thrombectomy 4. IVC filter placement COMPARISON:  CT angiography chest 12/14/2020 MEDICATIONS: None. ANESTHESIA/SEDATION: None. The patient was monitored by the anesthesia team throughout the procedure given their tenuous cardiovascular status. FLUOROSCOPY TIME:  Fluoroscopy Time: 53 minutes 42 seconds  (132 mGy). COMPLICATIONS: None immediate. TECHNIQUE: Informed written consent was obtained from the patient after a thorough discussion of the procedural risks, benefits and alternatives. All questions were addressed. Maximal Sterile Barrier Technique was utilized including caps, mask, sterile gowns, sterile gloves, sterile drape, hand hygiene and skin antiseptic. A timeout was performed prior to the initiation of the procedure. Right groin skin prepped and draped in usual fashion. The right common femoral vein was evaluated with ultrasound and shown to be patent. A permanent ultrasound image was obtained and placed in the patient's medical record. Using sterile gel and a sterile probe cover the right common femoral vein was entered with a 21 gauge needle during real time ultrasound guidance. 21 gauge needle exchanged for transitional dilator set over 0.018 in guidewire. Transitional dilator set exchanged for 6 French sheath over 0.035 in guidewire. Despite multiple attempts with single and double angle pigtail catheter, the pulmonary artery could not be accessed. The short 6 French sheath was exchanged for a 90 cm 7 [place] sheath. The tip of the sheath position just below the cavoatrial junction. The long 7 French sheath allowed for additional stability and the main pulmonary artery was accessed with a 6 [place] double angle pigtail catheter. Pre thrombectomy pulmonary artery pressure: 54/31 mmHg (mean 41 mmHg) Double angled pigtail catheter removed over exchange length Amplatz guidewire. Serial dilation was performed and 7 [place] sheath exchanged for [employer] 24 French sheath. Utilizing a  Bern catheter, the guidewire was directed into a right lower lobe pulmonary artery branch. 51 [place] FlowTriver device was advanced over the wire into the lateral basilar segmental and Truncus Anterior branches of the right pulmonary arterial system. Mechanical aspiration thrombectomy was performed extirpating thrombus.  Post thrombectomy pulmonary angiogram demonstrated significant reduction in overall clot burden in the right pulmonary arterial branches. The left lateral basilar segmental branch of the lower lobe artery was selected with the burn catheter. The 20 [place] FlowTriver device was advanced over the wire into the lateral basilar segmental branches of the left pulmonary arterial system. No significant thrombus could be removed with the 20 [place] device. The 20 French device was removed and the 24 [place] FlowTriver device was inserted. The tip was advanced into the main left pulmonary artery. The T20 FlowTriver device was then inserted through the 24 French device into the lateral basilar segment, and mechanical aspiration thrombectomy was performed extirpating thrombus. Post aspiration pulmonary angiogram demonstrated significant reduction in overall clot burden. Prior thrombectomy, the patient's blood pressure measured 87/67 mm Hg with a heart rate of 107 beats per minute. Post thrombectomy, pressures improved to 117/64 mm Hg with heart rate of 81 beats per minute. Patient's oxygenation improved from 93% on 15 L by non-rebreather 200% on 10 L by non-rebreather. FlowTriever device was removed. Bern catheter was advanced through the cord dry seal to the infrarenal inferior vena cava and venogram was performed in order to delineate the origin of the renal veins. Bern catheter was removed over a guidewire and Denali IVC filter was deployed in the infrarenal vena cava. The [place] Dry Seal sheath was removed and hemostasis achieved with purse-string suture and manual compression. IMPRESSION: 1. Bilateral pulmonary angiogram and Inari sunction thrombectomy as above. 2. Denali IVC filter placed. Electronically Signed   By: Miachel Roux M.D.   On: 12/15/2020 18:38   IR IVC FILTER PLMT / S&I Burke Keels GUID/MOD SED  Result Date: 12/15/2020 INDICATION: 60 year old woman with sub massive pulmonary artery emboli with RV LV ratio of 3.4.  Patient has a history of GBM and cannot receive tPA. Prior to the procedure patient was on non-rebreather at 15 L, with oxygen saturation of 93%. Interventional radiology consulted for catheter directed thrombectomy of pulmonary embolism. IVC filter also requested given history of lower extremity DVT and GBM. EXAM: 1. Ultrasound-guided access of right common femoral vein 2. Bilateral pulmonary venogram 3. Bilateral pulmonary suction thrombectomy 4. IVC filter placement COMPARISON:  CT angiography chest 12/14/2020 MEDICATIONS: None. ANESTHESIA/SEDATION: None. The patient was monitored by the anesthesia team throughout the procedure given their tenuous cardiovascular status. FLUOROSCOPY TIME:  Fluoroscopy Time: 53 minutes 42 seconds (132 mGy). COMPLICATIONS: None immediate. TECHNIQUE: Informed written consent was obtained from the patient after a thorough discussion of the procedural risks, benefits and alternatives. All questions were addressed. Maximal Sterile Barrier Technique was utilized including caps, mask, sterile gowns, sterile gloves, sterile drape, hand hygiene and skin antiseptic. A timeout was performed prior to the initiation of the procedure. Right groin skin prepped and draped in usual fashion. The right common femoral vein was evaluated with ultrasound and shown to be patent. A permanent ultrasound image was obtained and placed in the patient's medical record. Using sterile gel and a sterile probe cover the right common femoral vein was entered with a 21 gauge needle during real time ultrasound guidance. 21 gauge needle exchanged for transitional dilator set over 0.018 in guidewire. Transitional dilator set exchanged for 6 French sheath over  0.035 in guidewire. Despite multiple attempts with single and double angle pigtail catheter, the pulmonary artery could not be accessed. The short 6 French sheath was exchanged for a 90 cm 7 [place] sheath. The tip of the sheath position just below the cavoatrial  junction. The long 7 French sheath allowed for additional stability and the main pulmonary artery was accessed with a 6 [place] double angle pigtail catheter. Pre thrombectomy pulmonary artery pressure: 54/31 mmHg (mean 41 mmHg) Double angled pigtail catheter removed over exchange length Amplatz guidewire. Serial dilation was performed and 7 [place] sheath exchanged for [employer] 24 French sheath. Utilizing a Bern catheter, the guidewire was directed into a right lower lobe pulmonary artery branch. 71 [place] FlowTriver device was advanced over the wire into the lateral basilar segmental and Truncus Anterior branches of the right pulmonary arterial system. Mechanical aspiration thrombectomy was performed extirpating thrombus. Post thrombectomy pulmonary angiogram demonstrated significant reduction in overall clot burden in the right pulmonary arterial branches. The left lateral basilar segmental branch of the lower lobe artery was selected with the burn catheter. The 20 [place] FlowTriver device was advanced over the wire into the lateral basilar segmental branches of the left pulmonary arterial system. No significant thrombus could be removed with the 20 [place] device. The 20 French device was removed and the 24 [place] FlowTriver device was inserted. The tip was advanced into the main left pulmonary artery. The T20 FlowTriver device was then inserted through the 24 French device into the lateral basilar segment, and mechanical aspiration thrombectomy was performed extirpating thrombus. Post aspiration pulmonary angiogram demonstrated significant reduction in overall clot burden. Prior thrombectomy, the patient's blood pressure measured 87/67 mm Hg with a heart rate of 107 beats per minute. Post thrombectomy, pressures improved to 117/64 mm Hg with heart rate of 81 beats per minute. Patient's oxygenation improved from 93% on 15 L by non-rebreather 200% on 10 L by non-rebreather. FlowTriever device was removed.  Bern catheter was advanced through the cord dry seal to the infrarenal inferior vena cava and venogram was performed in order to delineate the origin of the renal veins. Bern catheter was removed over a guidewire and Denali IVC filter was deployed in the infrarenal vena cava. The Gore Dry Seal sheath was removed and hemostasis achieved with purse-string suture and manual compression. IMPRESSION: 1. Bilateral pulmonary angiogram and Inari sunction thrombectomy as above. 2. Denali IVC filter placed. Electronically Signed   By: Miachel Roux M.D.   On: 12/15/2020 18:38   IR THROMBECT PRIM MECH INIT (INCLU) MOD SED  Result Date: 12/15/2020 INDICATION: 60 year old woman with sub massive pulmonary artery emboli with RV LV ratio of 3.4. Patient has a history of GBM and cannot receive tPA. Prior to the procedure patient was on non-rebreather at 15 L, with oxygen saturation of 93%. Interventional radiology consulted for catheter directed thrombectomy of pulmonary embolism. IVC filter also requested given history of lower extremity DVT and GBM. EXAM: 1. Ultrasound-guided access of right common femoral vein 2. Bilateral pulmonary venogram 3. Bilateral pulmonary suction thrombectomy 4. IVC filter placement COMPARISON:  CT angiography chest 12/14/2020 MEDICATIONS: None. ANESTHESIA/SEDATION: None. The patient was monitored by the anesthesia team throughout the procedure given their tenuous cardiovascular status. FLUOROSCOPY TIME:  Fluoroscopy Time: 53 minutes 42 seconds (132 mGy). COMPLICATIONS: None immediate. TECHNIQUE: Informed written consent was obtained from the patient after a thorough discussion of the procedural risks, benefits and alternatives. All questions were addressed. Maximal Sterile Barrier Technique was utilized including  caps, mask, sterile gowns, sterile gloves, sterile drape, hand hygiene and skin antiseptic. A timeout was performed prior to the initiation of the procedure. Right groin skin prepped and  draped in usual fashion. The right common femoral vein was evaluated with ultrasound and shown to be patent. A permanent ultrasound image was obtained and placed in the patient's medical record. Using sterile gel and a sterile probe cover the right common femoral vein was entered with a 21 gauge needle during real time ultrasound guidance. 21 gauge needle exchanged for transitional dilator set over 0.018 in guidewire. Transitional dilator set exchanged for 6 French sheath over 0.035 in guidewire. Despite multiple attempts with single and double angle pigtail catheter, the pulmonary artery could not be accessed. The short 6 French sheath was exchanged for a 90 cm 7 [place] sheath. The tip of the sheath position just below the cavoatrial junction. The long 7 French sheath allowed for additional stability and the main pulmonary artery was accessed with a 6 [place] double angle pigtail catheter. Pre thrombectomy pulmonary artery pressure: 54/31 mmHg (mean 41 mmHg) Double angled pigtail catheter removed over exchange length Amplatz guidewire. Serial dilation was performed and 7 [place] sheath exchanged for [employer] 24 French sheath. Utilizing a Bern catheter, the guidewire was directed into a right lower lobe pulmonary artery branch. 30 [place] FlowTriver device was advanced over the wire into the lateral basilar segmental and Truncus Anterior branches of the right pulmonary arterial system. Mechanical aspiration thrombectomy was performed extirpating thrombus. Post thrombectomy pulmonary angiogram demonstrated significant reduction in overall clot burden in the right pulmonary arterial branches. The left lateral basilar segmental branch of the lower lobe artery was selected with the burn catheter. The 20 [place] FlowTriver device was advanced over the wire into the lateral basilar segmental branches of the left pulmonary arterial system. No significant thrombus could be removed with the 20 [place] device. The 20  French device was removed and the 24 [place] FlowTriver device was inserted. The tip was advanced into the main left pulmonary artery. The T20 FlowTriver device was then inserted through the 24 French device into the lateral basilar segment, and mechanical aspiration thrombectomy was performed extirpating thrombus. Post aspiration pulmonary angiogram demonstrated significant reduction in overall clot burden. Prior thrombectomy, the patient's blood pressure measured 87/67 mm Hg with a heart rate of 107 beats per minute. Post thrombectomy, pressures improved to 117/64 mm Hg with heart rate of 81 beats per minute. Patient's oxygenation improved from 93% on 15 L by non-rebreather 200% on 10 L by non-rebreather. FlowTriever device was removed. Bern catheter was advanced through the cord dry seal to the infrarenal inferior vena cava and venogram was performed in order to delineate the origin of the renal veins. Bern catheter was removed over a guidewire and Denali IVC filter was deployed in the infrarenal vena cava. The Gore Dry Seal sheath was removed and hemostasis achieved with purse-string suture and manual compression. IMPRESSION: 1. Bilateral pulmonary angiogram and Inari sunction thrombectomy as above. 2. Denali IVC filter placed. Electronically Signed   By: Miachel Roux M.D.   On: 12/15/2020 18:38   IR US Guide Vasc Access Right  Result Date: 12/15/2020 INDICATION: 60 year old woman with sub massive pulmonary artery emboli with RV LV ratio of 3.4. Patient has a history of GBM and cannot receive tPA. Prior to the procedure patient was on non-rebreather at 15 L, with oxygen saturation of 93%. Interventional radiology consulted for catheter directed thrombectomy of pulmonary embolism. IVC  filter also requested given history of lower extremity DVT and GBM. EXAM: 1. Ultrasound-guided access of right common femoral vein 2. Bilateral pulmonary venogram 3. Bilateral pulmonary suction thrombectomy 4. IVC filter  placement COMPARISON:  CT angiography chest 12/14/2020 MEDICATIONS: None. ANESTHESIA/SEDATION: None. The patient was monitored by the anesthesia team throughout the procedure given their tenuous cardiovascular status. FLUOROSCOPY TIME:  Fluoroscopy Time: 53 minutes 42 seconds (132 mGy). COMPLICATIONS: None immediate. TECHNIQUE: Informed written consent was obtained from the patient after a thorough discussion of the procedural risks, benefits and alternatives. All questions were addressed. Maximal Sterile Barrier Technique was utilized including caps, mask, sterile gowns, sterile gloves, sterile drape, hand hygiene and skin antiseptic. A timeout was performed prior to the initiation of the procedure. Right groin skin prepped and draped in usual fashion. The right common femoral vein was evaluated with ultrasound and shown to be patent. A permanent ultrasound image was obtained and placed in the patient's medical record. Using sterile gel and a sterile probe cover the right common femoral vein was entered with a 21 gauge needle during real time ultrasound guidance. 21 gauge needle exchanged for transitional dilator set over 0.018 in guidewire. Transitional dilator set exchanged for 6 French sheath over 0.035 in guidewire. Despite multiple attempts with single and double angle pigtail catheter, the pulmonary artery could not be accessed. The short 6 French sheath was exchanged for a 90 cm 7 [place] sheath. The tip of the sheath position just below the cavoatrial junction. The long 7 French sheath allowed for additional stability and the main pulmonary artery was accessed with a 6 [place] double angle pigtail catheter. Pre thrombectomy pulmonary artery pressure: 54/31 mmHg (mean 41 mmHg) Double angled pigtail catheter removed over exchange length Amplatz guidewire. Serial dilation was performed and 7 [place] sheath exchanged for [employer] 24 French sheath. Utilizing a Bern catheter, the guidewire was directed into a  right lower lobe pulmonary artery branch. 32 [place] FlowTriver device was advanced over the wire into the lateral basilar segmental and Truncus Anterior branches of the right pulmonary arterial system. Mechanical aspiration thrombectomy was performed extirpating thrombus. Post thrombectomy pulmonary angiogram demonstrated significant reduction in overall clot burden in the right pulmonary arterial branches. The left lateral basilar segmental branch of the lower lobe artery was selected with the burn catheter. The 20 [place] FlowTriver device was advanced over the wire into the lateral basilar segmental branches of the left pulmonary arterial system. No significant thrombus could be removed with the 20 [place] device. The 20 French device was removed and the 24 [place] FlowTriver device was inserted. The tip was advanced into the main left pulmonary artery. The T20 FlowTriver device was then inserted through the 24 [place] device into the lateral basilar segment, and mechanical aspiration thrombectomy was performed extirpating thrombus. Post aspiration pulmonary angiogram demonstrated significant reduction in overall clot burden. Prior thrombectomy, the patient's blood pressure measured 87/67 mm Hg with a heart rate of 107 beats per minute. Post thrombectomy, pressures improved to 117/64 mm Hg with heart rate of 81 beats per minute. Patient's oxygenation improved from 93% on 15 L by non-rebreather 200% on 10 L by non-rebreather. FlowTriever device was removed. Bern catheter was advanced through the cord dry seal to the infrarenal inferior vena cava and venogram was performed in order to delineate the origin of the renal veins. Bern catheter was removed over a guidewire and Denali IVC filter was deployed in the infrarenal vena cava. The [place] Dry Seal sheath was  removed and hemostasis achieved with purse-string suture and manual compression. IMPRESSION: 1. Bilateral pulmonary angiogram and Inari sunction thrombectomy  as above. 2. Denali IVC filter placed. Electronically Signed   By: Miachel Roux M.D.   On: 12/15/2020 18:38   EEG adult  Result Date: 12/15/2020 Lora Havens, MD     12/15/2020 10:32 AM Patient Name: NORINNE JEANE MRN: [id number] Epilepsy Attending: Lora Havens Referring Physician/Provider: Dr Renee Pain Date: 12/15/2020 Duration: 23.35 mins Patient history: 60 yo with GBM. EEG to evaluate for seizure Level of alertness: Awake AEDs during EEG study: LEV Technical aspects: This EEG study was done with scalp electrodes positioned according to the 10-20 International system of electrode placement. Electrical activity was acquired at a sampling rate of 500Hz  and reviewed with a high frequency filter of 70Hz  and a low frequency filter of 1Hz . EEG data were recorded continuously and digitally stored. Description: The posterior dominant rhythm consists of 10 Hz activity of moderate voltage (25-35 uV) seen predominantly in posterior head regions, symmetric and reactive to eye opening and eye closing. Hyperventilation and photic stimulation were not performed.   IMPRESSION: This study is within normal limits. No seizures or epileptiform discharges were seen throughout the recording. Priyanka O Yadav   VAS [place] LOWER EXTREMITY VENOUS (DVT)  Result Date: 12/15/2020  Lower Venous DVT Study Indications: Pulmonary embolism.  Risk Factors: Cancer. Comparison Study: No prior studies. Performing Technologist: Oliver Hum RVT  Examination Guidelines: A complete evaluation includes B-mode imaging, spectral Doppler, color Doppler, and power Doppler as needed of all accessible portions of each vessel. Bilateral testing is considered an integral part of a complete examination. Limited examinations for reoccurring indications may be performed as noted. The reflux portion of the exam is performed with the patient in reverse Trendelenburg.  +---------+---------------+---------+-----------+----------+--------------+  RIGHT    CompressibilityPhasicitySpontaneityPropertiesThrombus Aging +---------+---------------+---------+-----------+----------+--------------+ CFV      Full           Yes      Yes                                 +---------+---------------+---------+-----------+----------+--------------+ SFJ      Full                                                        +---------+---------------+---------+-----------+----------+--------------+ FV Prox  Full                                                        +---------+---------------+---------+-----------+----------+--------------+ FV Mid   None           No       No                   Acute          +---------+---------------+---------+-----------+----------+--------------+ FV DistalFull                                                        +---------+---------------+---------+-----------+----------+--------------+  PFV      Full                                                        +---------+---------------+---------+-----------+----------+--------------+ POP      Full           Yes      Yes                                 +---------+---------------+---------+-----------+----------+--------------+ PTV      Full                                                        +---------+---------------+---------+-----------+----------+--------------+ PERO     Full                                                        +---------+---------------+---------+-----------+----------+--------------+   +---------+---------------+---------+-----------+----------+--------------+ LEFT     CompressibilityPhasicitySpontaneityPropertiesThrombus Aging +---------+---------------+---------+-----------+----------+--------------+ CFV      Full           Yes      Yes                                 +---------+---------------+---------+-----------+----------+--------------+ SFJ      Full                                                         +---------+---------------+---------+-----------+----------+--------------+ FV Prox  Full                                                        +---------+---------------+---------+-----------+----------+--------------+ FV Mid   None           No       No                   Acute          +---------+---------------+---------+-----------+----------+--------------+ FV DistalNone           No       No                   Acute          +---------+---------------+---------+-----------+----------+--------------+ PFV      Full                                                        +---------+---------------+---------+-----------+----------+--------------+  POP      Full           Yes      Yes                                 +---------+---------------+---------+-----------+----------+--------------+ PTV      Full                                                        +---------+---------------+---------+-----------+----------+--------------+ PERO     Full                                                        +---------+---------------+---------+-----------+----------+--------------+     Summary: RIGHT: - Findings consistent with acute deep vein thrombosis involving the right femoral vein. - No cystic structure found in the popliteal fossa.  LEFT: - Findings consistent with acute deep vein thrombosis involving the left femoral vein. - No cystic structure found in the popliteal fossa.  *See table(s) above for measurements and observations.    Preliminary     Labs:  CBC: Recent Labs    12/14/20 1207 12/14/20 2138 12/14/20 2145 12/15/20 0510 12/16/20 0225  WBC 7.8 11.4*  --  7.0 6.1  HGB 13.7 12.1 11.9* 12.2 9.2*  HCT 39.5 36.5 35.0* 37.0 27.6*  PLT 137* 135*  --  145* 117*    COAGS: Recent Labs    12/14/20 2138 12/15/20 0740 12/15/20 2220  INR 1.3*  --   --   APTT 35 >200* 37*    BMP: Recent Labs    02/17/20 0136  08/02/20 1414 11/09/20 1235 12/14/20 1207 12/14/20 2138 12/14/20 2145 12/15/20 0510  NA 142   < > 139 141 136 135 143  K 3.9   < > 3.8 4.0 4.8 4.8 3.8  CL 109   < > 106 104 102 102 113*  CO2 24   < > 26 26 20*  --  19*  GLUCOSE 142*   < > 90 118* 280* 268* 124*  BUN 6   < > 12 13 21* 21* 18  CALCIUM 8.8*   < > 9.2 9.4 8.3*  --  7.7*  CREATININE 0.78   < > 0.79 0.87 1.18* 1.00 0.60  GFRNONAA >60   < > >60 >60 53*  --  >60  GFRAA >60  --   --   --   --   --   --    < > = values in this interval not displayed.    LIVER FUNCTION TESTS: Recent Labs    10/05/20 1432 11/09/20 1235 12/14/20 1207 12/14/20 2138  BILITOT 0.4 0.8 0.5 0.9  AST 15 13* 13* 141*  ALT 16 13 9  92*  ALKPHOS 37* 42 62 81  PROT 6.0* 6.4* 6.8 5.9*  ALBUMIN 3.6 3.6 3.5 3.0*    Assessment and Plan:  60 y.o female inpatient. History of glioblastoma multiforme currently receiving chemotherapy and seizure. Presented to the ED at [facility] with [facility] with concern for possible seizure. Patient was found to tachycardic, tachyphemic and hypotensive a saddle PE with right  heart strain. CTA Chest from 2.11.22 reads Partially occlusive saddle emboli with nearly occlusive thrombus within both lungs throughout the segmental and subsegmental arterial branches. CT evidence of right heart strain (RV/LV Ratio = 2.1) consistent with at least submassive (intermediate risk) PE. The presence of right heart strain has been associated with an increased risk of morbidity and mortality. Ms Somerlin was transferred to [facility] where a bilateral pulmonary angiogram was performed using Inari suction for thrombectomy and placement of an IVC filter. Patient remains on heparin gtt due to concern for residual clot burden.  Patient seen with husband at bedside. Ms. Kimmons reports improvement in breathing since Thursday. With some tenderness at the right femoral access site. Site is soft with no active bleeding and no appreciable pseudoaneurysm. Dressing is  C/D/I. Suture noted.  Right leg slight elevated with an ice pack during evaluation. Currently on 2L of O2 via [place] with good saturation and resolution of tachycardia. Echo not finalized at this time.  Case reviewed with IR Attending Dr. Shellia Cleverly. Suture can be removed 48-72 hours post procedure.  IR will continue to follow along - plans per CC.    Electronically Signed: Jacqualine Mau, NP 12/16/2020, 11:13 AM   I spent a total of 25 Minutes at the patient's bedside AND on the patient's hospital floor or unit, greater than 50% of which was counseling/coordinating care for Pulmonary thrombectomy and IVC filter placement     .

## 2020-12-16 NOTE — Progress Notes (Signed)
   PM rounds  Remains on 2L [place]  BP/HR stable No bleeding  Lactate and trop slightly high ECHO - ok  Code status: DNR  Plan - move to tele - small fluid bolus   Updated patient and wife at bedside  SIGNATURE    Dr. Brand Males, M.D., F.C.C.P,  Pulmonary and Critical Care Medicine Staff Physician, [place] Director - Interstitial Lung Disease  Program  Pulmonary [place] at [place], [place], [address]  Pager: [phone], If no answer  OR between  19:00-7:00h: page [phone] Telephone (clinical office): [phone] Telephone (research): [phone]  5:46 PM 12/16/2020

## 2020-12-16 NOTE — Progress Notes (Signed)
  Echocardiogram 2D Echocardiogram has been performed.  Johny Chess 12/16/2020, 11:49 AM

## 2020-12-16 NOTE — Progress Notes (Signed)
NAME:  Hannah [name] MRN:  [id number] DOB:  January 12, 1961 LOS: 1 ADMISSION DATE:  12/14/2020 DATE OF SERVICE:  12/15/2020  CHIEF COMPLAINT:  Altered mental status; dyspnea   HISTORY & PHYSICAL  History of Present Illness  60 year old woman with glioblastoma multiforme a diagnosed in 2021, undergoing chemotherapy admitted with dyspnea for about 1 week, CT angiogram showing saddle PE. She is requiring nonrebreather and mildly hypotensive. Known seizure disorder on Keppra    Past Medical/Surgical/Social/Family History   Past Medical History:  Diagnosis Date  . Cervical spondylolysis 12/15/2019   Skeleton: Mild cervical spondylosis C6-7. Incidental find.   . Colon polyp   . GBM (glioblastoma multiforme) ([place])   . Left ankle sprain 10/07/2011  . Seizure ([place])    partial   . Tick bite 02/2020     Procedures:  N/A   Significant Diagnostic Tests:  2/10: Chest CTA shows saddle PE, RV/LV ratio 2.1   Micro Data:    Antimicrobials:  N/A    Interim history/subjective:   Soft blood pressure 80s, Denies headache. Complains of dyspnea, no chest pain   Objective   BP 100/69   Pulse 69   Temp 98.1 F (36.7 C) (Oral)   Resp 15   Ht 5\' 4"  (1.626 m)   Wt 60.2 kg   LMP 06/30/2015   SpO2 100%   BMI 22.78 kg/m     Filed Weights   12/14/20 2037 12/16/20 0218  Weight: 58 kg 60.2 kg    Intake/Output Summary (Last 24 hours) at 12/16/2020 1220 Last data filed at 12/16/2020 0700 Gross per 24 hour  Intake 756.05 ml  Output 1675 ml  Net -918.95 ml        Examination: Gen. Pleasant, well-nourished, in no distress, anxious affect, on NRB ENT - no pallor,icterus, no post nasal drip Neck: No JVD, no thyromegaly, no carotid bruits Lungs: no use of accessory muscles, no dullness to percussion, clear without rales or rhonchi  Cardiovascular: Rhythm regular, heart sounds  normal, no murmurs or gallops, no peripheral edema Abdomen: soft and non-tender, no hepatosplenomegaly,  BS normal. Musculoskeletal: No deformities, no cyanosis or clubbing Neuro:  alert, non focal   Resolved Hospital Problem list   N/A   Assessment & Plan:   ASSESSMENT/PLAN:   Acute massive pulmonary embolism -She is hypotensive and requiring nonrebreather.  TPA is contraindicated due to brain tumor.  She is on IV heparin.  I discussed with patient risks and benefits of IR guided aspiration procedure in the setting.  She is willing to proceed.  I discussed with IR Dr. Dwaine Gale, she will be transferred to [facility] for this procedure.  I have called [facility] for transfer and PCCM team will assume care post procedure at [facility]. -Continue nonrebreather and IV heparin meantime. -Fluids for hypertension, if worse can add low-dose Levophed peripheral  Seizure disorder -EEG ordered by admitting physician. Continue Keppra   Best practice:  Diet: regular Pain/Anxiety/Delirium protocol (if indicated): N/A VAP protocol (if indicated): N/A DVT prophylaxis: heparin GI prophylaxis: Protonix Glucose control: SSI Mobility/Activity: as tolerated.   Code Status: DNR Family Communication: Patient and daughter at bedside Disposition: admit to ICU   Labs   CBC: Recent Labs  Lab 12/14/20 1207 12/14/20 2138 12/14/20 2145 12/15/20 0510 12/16/20 0225  WBC 7.8 11.4*  --  7.0 6.1  NEUTROABS 6.6 10.3*  --   --   --   HGB 13.7 12.1 11.9* 12.2 9.2*  HCT 39.5 36.5 35.0* 37.0 27.6*  MCV  94.7 99.7  --  99.7 97.5  PLT 137* 135*  --  145* 117*    Basic Metabolic Panel: Recent Labs  Lab 12/14/20 1207 12/14/20 2138 12/14/20 2145 12/15/20 0510  NA 141 136 135 143  K 4.0 4.8 4.8 3.8  CL 104 102 102 113*  CO2 26 20*  --  19*  GLUCOSE 118* 280* 268* 124*  BUN 13 21* 21* 18  CREATININE 0.87 1.18* 1.00 0.60  CALCIUM 9.4 8.3*  --  7.7*  MG  --  2.2  --  1.9   GFR: Estimated Creatinine Clearance: 65.4 mL/min (by C-G formula based on SCr of 0.6 mg/dL). Recent Labs  Lab 12/14/20 1207 12/14/20 2138  12/15/20 0510 12/16/20 0225  WBC 7.8 11.4* 7.0 6.1    Liver Function Tests: Recent Labs  Lab 12/14/20 1207 12/14/20 2138  AST 13* 141*  ALT 9 92*  ALKPHOS 62 81  BILITOT 0.5 0.9  PROT 6.8 5.9*  ALBUMIN 3.5 3.0*   No results for input(s): LIPASE, AMYLASE in the last 168 hours. No results for input(s): AMMONIA in the last 168 hours.  ABG    Component Value Date/Time   PHART 7.390 02/16/2020 1355   PCO2ART 41.4 02/16/2020 1355   PO2ART 371.0 (H) 02/16/2020 1355   HCO3 25.3 02/16/2020 1355   TCO2 20 (L) 12/14/2020 2145   O2SAT 100.0 02/16/2020 1355     Additional critical care time x 40 minutes  Patient critically ill due to saddle PE Interventions to address this today IR procedure, IV heparin Risk of deterioration without these interventions is high  I personally spent 40 minutes providing critical care not including any separately billable procedures  Kara Mead MD. FCCP. [facility] Pager : [phone]  If no response to pager , please call [phone] until 7 pm After 7:00 pm call Elink  [phone]   12/16/2020     NAME:  Hannah [name] MRN:  [id number] DOB:  [birth year] LOS: 1 ADMISSION DATE:  12/14/2020 DATE OF SERVICE:  12/15/2020  CHIEF COMPLAINT:  Altered mental status; dyspnea   HISTORY & PHYSICAL  brief  60 year old woman with glioblastoma multiforme a diagnosed in 2021, undergoing chemotherapy admitted with dyspnea for about 1 week, CT angiogram showing saddle PE. She is requiring nonrebreather and mildly hypotensive. Known seizure disorder on Keppra    Past Medical/Surgical/Social/Family History    has a past medical history of Cervical spondylolysis (12/15/2019), Colon polyp, GBM (glioblastoma multiforme) ([place]), Left ankle sprain (10/07/2011), Seizure ([place]), and Tick bite (02/2020).   has a past surgical history that includes Foot surgery (Left); Tonsilectomy/adenoidectomy with myringotomy; Wisdom tooth extraction;  Craniotomy (Left, 02/16/2020); Application of cranial navigation (Left, 02/16/2020); IR Angiogram Pulmonary Bilateral Selective (12/15/2020); IR IVC FILTER PLMT / S&I /IMG GUID/MOD SED (12/15/2020); IR US Guide Vasc Access Right (12/15/2020); IR THROMBECT PRIM MECH INIT (INCLU) MOD SED (12/15/2020); IR Angiogram Selective Each Additional Vessel (12/15/2020); and IR Angiogram Selective Each Additional Vessel (12/15/2020).   Procedures:  12/15/2020: Thrombectomy and status post IVC filter placement   Significant Diagnostic Tests:  2/10: Chest CTA shows saddle PE, RV/LV ratio 2.1 12/15/2020: EEG normal 12/15/2020: Bilateral Dopplers positive for DVT both lower extremities 12/16/2020: 2D echo  Micro Data:     Antimicrobials:  N/A    Interim history/subjective:    12/16/2020 -she is status post thrombectomy for massive pulmonary embolism.  She is now on IV heparin drip overnight.  She feels much better.  She  is down to 2 L nasal cannula.  Nurse states that when she tried to wean patient off oxygen she started desaturating.  Patient feels significantly better.  Husband at the bedside.  Patient wants blood sugar checks completely stopped.  She is hemodynamically stable.  Not on pressors.  Has radial A-line.  Echo result is pending  Objective   BP 100/69   Pulse 69   Temp 98.1 F (36.7 C) (Oral)   Resp 15   Ht 5\' 4"  (1.626 m)   Wt 60.2 kg   LMP 06/30/2015   SpO2 100%   BMI 22.78 kg/m     Filed Weights   12/14/20 2037 12/16/20 0218  Weight: 58 kg 60.2 kg    Intake/Output Summary (Last 24 hours) at 12/16/2020 1220 Last data filed at 12/16/2020 0700 Gross per 24 hour  Intake 756.05 ml  Output 1675 ml  Net -918.95 ml        Examination: Pleasant female sitting in bed.  Talking to husband 2 L nasal oxygen on.  No elevated JVP no neck nodes.  Clear to auscultation bilaterally normal heart sounds.  Abdomen soft.  No sinus no clubbing no edema.  No bleeding abdomen soft.  Bilateral calves  appear slightly warm.  Resolved Hospital Problem list   N/A   Assessment & Plan:   ASSESSMENT/PLAN:   Acute massive pulmonary embolism with bilateral DVT adt admit 12/14/2020  With low BP and NRB. Not a candidate for thrombolytics due to presence of glioblastoma multiforme    -Status post thrombectomy and status post IVC placement 12/15/2020 by interventional radiology and on IV heparin since then   12/16/2020: Doing well on 2 L nasal cannula.  Hemodynamically stable and significantly better  Plan -Await echocardiogram report -Track BNP, troponin, lactic acid for biomarker   -Continue IV heparin -suspect will need this for 2-5 days before starting oral anticoagulation -Oxygen for pulse ox goal greater than 92%    Glioblastoma multiforme I followed by  oncology at the cancer center Seizure disorder -EEG 12/15/2020: Negative for seizures  12/16/2020: Neurologically intact  Plan Continue Keppra   Anemia - > > ? Cause. No overt bleeding  Plan -Recheck CBC 12/16/2020 - PRBC for hgb </= 6.9gm%    - exceptions are   -  if ACS susepcted/confirmed then transfuse for hgb </= 8.0gm%,  or    -  active bleeding with hemodynamic instability, then transfuse regardless of hemoglobin value   At at all times try to transfuse 1 unit prbc as possible with exception of active hemorrhage  Transaminitis  12/16/2020: Probably due to pulmonary embolism and stress of the liver  Plan -Track  Electrolyte imbalance  2/12 -very mild hypokalemia and hypomagnesemia  Plan -Replete   Best practice:  Diet: regular Pain/Anxiety/Delirium protocol (if indicated): N/A VAP protocol (if indicated): N/A DVT prophylaxis: heparin IV  GI prophylaxis: Protonix Glucose control: SSI -> dc at patient request Mobility/Activity: bed rest -> ambulate when hypoxemia and RV strain and biomarkers better   Code Status: DNR Family Communication: 12/16/2020: patient and husband Disposition: ICU  -> aim to move to  tele later 12/16/2020 and tRH primary from 12/17/20        ATTESTATION & SIGNATURE   The patient Rogelio A Charley is critically ill with multiple organ systems failure and requires high complexity decision making for assessment and support, frequent evaluation and titration of therapies, application of advanced monitoring technologies and extensive interpretation of multiple databases.   Critical Care  Time devoted to patient care services described in this note is  31  Minutes. This time reflects time of care of this signee Dr Brand Males. This critical care time does not reflect procedure time, or teaching time or supervisory time of PA/NP/Med student/Med Resident etc but could involve care discussion time     Dr. Brand Males, M.D., [facility].C.P Pulmonary and Critical Care Medicine Staff Physician [place] [facility] Pager: [phone], If no answer or between  15:00h - 7:00h: call [phone]  12/16/2020 12:20 PM    LABS    PULMONARY Recent Labs  Lab 12/14/20 2145  TCO2 20*    CBC Recent Labs  Lab 12/14/20 2138 12/14/20 2145 12/15/20 0510 12/16/20 0225  HGB 12.1 11.9* 12.2 9.2*  HCT 36.5 35.0* 37.0 27.6*  WBC 11.4*  --  7.0 6.1  PLT 135*  --  145* 117*    COAGULATION Recent Labs  Lab 12/14/20 2138  INR 1.3*    CARDIAC  No results for input(s): TROPONINI in the last 168 hours. No results for input(s): PROBNP in the last 168 hours.   CHEMISTRY Recent Labs  Lab 12/14/20 1207 12/14/20 2138 12/14/20 2145 12/15/20 0510  NA 141 136 135 143  K 4.0 4.8 4.8 3.8  CL 104 102 102 113*  CO2 26 20*  --  19*  GLUCOSE 118* 280* 268* 124*  BUN 13 21* 21* 18  CREATININE 0.87 1.18* 1.00 0.60  CALCIUM 9.4 8.3*  --  7.7*  MG  --  2.2  --  1.9   Estimated Creatinine Clearance: 65.4 mL/min (by C-G formula based on SCr of 0.6 mg/dL).   LIVER Recent Labs  Lab 12/14/20 1207 12/14/20 2138  AST 13* 141*  ALT 9 92*   ALKPHOS 62 81  BILITOT 0.5 0.9  PROT 6.8 5.9*  ALBUMIN 3.5 3.0*  INR  --  1.3*     INFECTIOUS No results for input(s): LATICACIDVEN, PROCALCITON in the last 168 hours.   ENDOCRINE CBG (last 3)  Recent Labs    12/16/20 0317 12/16/20 0725 12/16/20 1158  GLUCAP 92 86 78         IMAGING x48h  - image(s) personally visualized  -   highlighted in bold CT Head Wo Contrast  Result Date: 12/14/2020 CLINICAL DATA:  60 year old female with syncope, seizure, and abnormal neurological exam. Known glioblastoma undergoing chemo and radiation treatment. EXAM: CT HEAD WITHOUT CONTRAST TECHNIQUE: Contiguous axial images were obtained from the base of the skull through the vertex without intravenous contrast. COMPARISON:  Brain MRI dated 11/02/2020. FINDINGS: Brain: Ill-defined 3.8 x 2.3 cm low attenuating area centered at anterior corpus callosum and crossing the midline correspond to the necrotic mass seen on the prior MRI. There is interval development of moderate amount of white matter edema predominantly involving the frontal lobes, increased since the prior MRI. There is approximately 4 mm right to left midline shift (measured at the level of the foramen Monro). Ill-defined area of higher attenuation involving the left frontal convexity (23/2) may represent volume averaging from adjacent cortices or additional mass. MRI without and with contrast is recommended for better evaluation of the mass and comparison with prior MRI images. There is no acute intracranial hemorrhage. Vascular: No hyperdense vessel or unexpected calcification. Skull: Left frontal craniotomy.  No acute calvarial pathology. Sinuses/Orbits: No acute finding. Other: None IMPRESSION: 1. Increase in the degree of edema with 4 mm right to  left midline shift. MRI without and with contrast may provide better evaluation. 2. No acute intracranial hemorrhage. These results were called by telephone at the time of interpretation on  12/14/2020 at 9:42 pm to provider [facility] , who verbally acknowledged these results. Electronically Signed   By: Anner Crete M.D.   On: 12/14/2020 21:49   CT Angio Chest PE W/Cm &/Or Wo Cm  Result Date: 12/14/2020 CLINICAL DATA:  History of brain tumor shortness of breath EXAM: CT ANGIOGRAPHY CHEST WITH CONTRAST TECHNIQUE: Multidetector CT imaging of the chest was performed using the standard protocol during bolus administration of intravenous contrast. Multiplanar CT image reconstructions and MIPs were obtained to evaluate the vascular anatomy. CONTRAST:  151mL OMNIPAQUE IOHEXOL 350 MG/ML SOLN COMPARISON:  None. FINDINGS: Cardiovascular: There is a optimal opacification of the pulmonary arteries. There is partially occlusive thrombus within the central central bifurcation of the main pulmonary arteries with nearly occlusive thrombus in the bilateral segmental and subsegmental arterial branches throughout both lungs. There is evidence of right ventricular heart strain, RV/LV ratio equals 2.1 there is a small pericardial effusion present. There is normal three-vessel brachiocephalic anatomy without proximal stenosis. Scattered mild aortic atherosclerosis is seen. Mediastinum/Nodes: No hilar, mediastinal, or axillary adenopathy. Thyroid gland, trachea, and esophagus demonstrate no significant findings. Lungs/Pleura: A small left pleural effusion with adjacent patchy airspace opacity at the left lung base, likely atelectasis is noted. Upper Abdomen: No acute abnormalities present in the visualized portions of the upper abdomen. Hree focus of contrast is seen within the IVC, likely consistent with right heart strain. Musculoskeletal: No chest wall abnormality. No acute or significant osseous findings. Review of the MIP images confirms the above findings. IMPRESSION: Partially occlusive saddle emboli with nearly occlusive thrombus within both lungs throughout the segmental and subsegmental arterial  branches. CTevidence of right heart strain (RV/LV Ratio = 2.1) consistent with at least submassive (intermediate risk) PE. The presence of right heart strain has been associated with an increased risk of morbidity and mortality. Small pericardial effusion Small left pleural effusion with basilar atelectasis These results were called by telephone at the time of interpretation on 12/14/2020 at 9:42 pm to provider [facility] , who verbally acknowledged these results. Electronically Signed   By: Prudencio Pair M.D.   On: 12/14/2020 21:44   IR Angiogram Pulmonary Bilateral Selective  Result Date: 12/15/2020 INDICATION: 60 year old woman with sub massive pulmonary artery emboli with RV LV ratio of 3.4. Patient has a history of GBM and cannot receive tPA. Prior to the procedure patient was on non-rebreather at 15 L, with oxygen saturation of 93%. Interventional radiology consulted for catheter directed thrombectomy of pulmonary embolism. IVC filter also requested given history of lower extremity DVT and GBM. EXAM: 1. Ultrasound-guided access of right common femoral vein 2. Bilateral pulmonary venogram 3. Bilateral pulmonary suction thrombectomy 4. IVC filter placement COMPARISON:  CT angiography chest 12/14/2020 MEDICATIONS: None. ANESTHESIA/SEDATION: None. The patient was monitored by the anesthesia team throughout the procedure given their tenuous cardiovascular status. FLUOROSCOPY TIME:  Fluoroscopy Time: 53 minutes 42 seconds (132 mGy). COMPLICATIONS: None immediate. TECHNIQUE: Informed written consent was obtained from the patient after a thorough discussion of the procedural risks, benefits and alternatives. All questions were addressed. Maximal Sterile Barrier Technique was utilized including caps, mask, sterile gowns, sterile gloves, sterile drape, hand hygiene and skin antiseptic. A timeout was performed prior to the initiation of the procedure. Right groin skin prepped and draped in usual fashion. The right  common femoral  vein was evaluated with ultrasound and shown to be patent. A permanent ultrasound image was obtained and placed in the patient's medical record. Using sterile gel and a sterile probe cover the right common femoral vein was entered with a 21 gauge needle during real time ultrasound guidance. 21 gauge needle exchanged for transitional dilator set over 0.018 in guidewire. Transitional dilator set exchanged for 6 French sheath over 0.035 in guidewire. Despite multiple attempts with single and double angle pigtail catheter, the pulmonary artery could not be accessed. The short 6 French sheath was exchanged for a 90 cm 7 [place] sheath. The tip of the sheath position just below the cavoatrial junction. The long 7 French sheath allowed for additional stability and the main pulmonary artery was accessed with a 6 [place] double angle pigtail catheter. Pre thrombectomy pulmonary artery pressure: 54/31 mmHg (mean 41 mmHg) Double angled pigtail catheter removed over exchange length Amplatz guidewire. Serial dilation was performed and 7 [place] sheath exchanged for [employer] 24 French sheath. Utilizing a Bern catheter, the guidewire was directed into a right lower lobe pulmonary artery branch. 49 [place] FlowTriver device was advanced over the wire into the lateral basilar segmental and Truncus Anterior branches of the right pulmonary arterial system. Mechanical aspiration thrombectomy was performed extirpating thrombus. Post thrombectomy pulmonary angiogram demonstrated significant reduction in overall clot burden in the right pulmonary arterial branches. The left lateral basilar segmental branch of the lower lobe artery was selected with the burn catheter. The 20 [place] FlowTriver device was advanced over the wire into the lateral basilar segmental branches of the left pulmonary arterial system. No significant thrombus could be removed with the 20 [place] device. The 20 French device was removed and the 24  [place] FlowTriver device was inserted. The tip was advanced into the main left pulmonary artery. The T20 FlowTriver device was then inserted through the 24 French device into the lateral basilar segment, and mechanical aspiration thrombectomy was performed extirpating thrombus. Post aspiration pulmonary angiogram demonstrated significant reduction in overall clot burden. Prior thrombectomy, the patient's blood pressure measured 87/67 mm Hg with a heart rate of 107 beats per minute. Post thrombectomy, pressures improved to 117/64 mm Hg with heart rate of 81 beats per minute. Patient's oxygenation improved from 93% on 15 L by non-rebreather 200% on 10 L by non-rebreather. FlowTriever device was removed. Bern catheter was advanced through the cord dry seal to the infrarenal inferior vena cava and venogram was performed in order to delineate the origin of the renal veins. Bern catheter was removed over a guidewire and Denali IVC filter was deployed in the infrarenal vena cava. The [place] Dry Seal sheath was removed and hemostasis achieved with purse-string suture and manual compression. IMPRESSION: 1. Bilateral pulmonary angiogram and Inari sunction thrombectomy as above. 2. Denali IVC filter placed. Electronically Signed   By: Miachel Roux M.D.   On: 12/15/2020 18:38   IR Angiogram Selective Each Additional Vessel  Result Date: 12/15/2020 INDICATION: 59 year old woman with sub massive pulmonary artery emboli with RV LV ratio of 3.4. Patient has a history of GBM and cannot receive tPA. Prior to the procedure patient was on non-rebreather at 15 L, with oxygen saturation of 93%. Interventional radiology consulted for catheter directed thrombectomy of pulmonary embolism. IVC filter also requested given history of lower extremity DVT and GBM. EXAM: 1. Ultrasound-guided access of right common femoral vein 2. Bilateral pulmonary venogram 3. Bilateral pulmonary suction thrombectomy 4. IVC filter placement COMPARISON:  CT  angiography  chest 12/14/2020 MEDICATIONS: None. ANESTHESIA/SEDATION: None. The patient was monitored by the anesthesia team throughout the procedure given their tenuous cardiovascular status. FLUOROSCOPY TIME:  Fluoroscopy Time: 53 minutes 42 seconds (132 mGy). COMPLICATIONS: None immediate. TECHNIQUE: Informed written consent was obtained from the patient after a thorough discussion of the procedural risks, benefits and alternatives. All questions were addressed. Maximal Sterile Barrier Technique was utilized including caps, mask, sterile gowns, sterile gloves, sterile drape, hand hygiene and skin antiseptic. A timeout was performed prior to the initiation of the procedure. Right groin skin prepped and draped in usual fashion. The right common femoral vein was evaluated with ultrasound and shown to be patent. A permanent ultrasound image was obtained and placed in the patient's medical record. Using sterile gel and a sterile probe cover the right common femoral vein was entered with a 21 gauge needle during real time ultrasound guidance. 21 gauge needle exchanged for transitional dilator set over 0.018 in guidewire. Transitional dilator set exchanged for 6 French sheath over 0.035 in guidewire. Despite multiple attempts with single and double angle pigtail catheter, the pulmonary artery could not be accessed. The short 6 French sheath was exchanged for a 90 cm 7 [place] sheath. The tip of the sheath position just below the cavoatrial junction. The long 7 French sheath allowed for additional stability and the main pulmonary artery was accessed with a 6 [place] double angle pigtail catheter. Pre thrombectomy pulmonary artery pressure: 54/31 mmHg (mean 41 mmHg) Double angled pigtail catheter removed over exchange length Amplatz guidewire. Serial dilation was performed and 7 [place] sheath exchanged for [employer] 24 French sheath. Utilizing a Bern catheter, the guidewire was directed into a right lower lobe pulmonary  artery branch. 41 [place] FlowTriver device was advanced over the wire into the lateral basilar segmental and Truncus Anterior branches of the right pulmonary arterial system. Mechanical aspiration thrombectomy was performed extirpating thrombus. Post thrombectomy pulmonary angiogram demonstrated significant reduction in overall clot burden in the right pulmonary arterial branches. The left lateral basilar segmental branch of the lower lobe artery was selected with the burn catheter. The 20 [place] FlowTriver device was advanced over the wire into the lateral basilar segmental branches of the left pulmonary arterial system. No significant thrombus could be removed with the 20 [place] device. The 20 French device was removed and the 24 [place] FlowTriver device was inserted. The tip was advanced into the main left pulmonary artery. The T20 FlowTriver device was then inserted through the 24 French device into the lateral basilar segment, and mechanical aspiration thrombectomy was performed extirpating thrombus. Post aspiration pulmonary angiogram demonstrated significant reduction in overall clot burden. Prior thrombectomy, the patient's blood pressure measured 87/67 mm Hg with a heart rate of 107 beats per minute. Post thrombectomy, pressures improved to 117/64 mm Hg with heart rate of 81 beats per minute. Patient's oxygenation improved from 93% on 15 L by non-rebreather 200% on 10 L by non-rebreather. FlowTriever device was removed. Bern catheter was advanced through the cord dry seal to the infrarenal inferior vena cava and venogram was performed in order to delineate the origin of the renal veins. Bern catheter was removed over a guidewire and Denali IVC filter was deployed in the infrarenal vena cava. The [place] Dry Seal sheath was removed and hemostasis achieved with purse-string suture and manual compression. IMPRESSION: 1. Bilateral pulmonary angiogram and Inari sunction thrombectomy as above. 2. Denali IVC  filter placed. Electronically Signed   By: Danie Binder.D.  On: 12/15/2020 18:38   IR Angiogram Selective Each Additional Vessel  Result Date: 12/15/2020 INDICATION: 60 year old woman with sub massive pulmonary artery emboli with RV LV ratio of 3.4. Patient has a history of GBM and cannot receive tPA. Prior to the procedure patient was on non-rebreather at 15 L, with oxygen saturation of 93%. Interventional radiology consulted for catheter directed thrombectomy of pulmonary embolism. IVC filter also requested given history of lower extremity DVT and GBM. EXAM: 1. Ultrasound-guided access of right common femoral vein 2. Bilateral pulmonary venogram 3. Bilateral pulmonary suction thrombectomy 4. IVC filter placement COMPARISON:  CT angiography chest 12/14/2020 MEDICATIONS: None. ANESTHESIA/SEDATION: None. The patient was monitored by the anesthesia team throughout the procedure given their tenuous cardiovascular status. FLUOROSCOPY TIME:  Fluoroscopy Time: 53 minutes 42 seconds (132 mGy). COMPLICATIONS: None immediate. TECHNIQUE: Informed written consent was obtained from the patient after a thorough discussion of the procedural risks, benefits and alternatives. All questions were addressed. Maximal Sterile Barrier Technique was utilized including caps, mask, sterile gowns, sterile gloves, sterile drape, hand hygiene and skin antiseptic. A timeout was performed prior to the initiation of the procedure. Right groin skin prepped and draped in usual fashion. The right common femoral vein was evaluated with ultrasound and shown to be patent. A permanent ultrasound image was obtained and placed in the patient's medical record. Using sterile gel and a sterile probe cover the right common femoral vein was entered with a 21 gauge needle during real time ultrasound guidance. 21 gauge needle exchanged for transitional dilator set over 0.018 in guidewire. Transitional dilator set exchanged for 6 French sheath over 0.035  in guidewire. Despite multiple attempts with single and double angle pigtail catheter, the pulmonary artery could not be accessed. The short 6 French sheath was exchanged for a 90 cm 7 [place] sheath. The tip of the sheath position just below the cavoatrial junction. The long 7 French sheath allowed for additional stability and the main pulmonary artery was accessed with a 6 [place] double angle pigtail catheter. Pre thrombectomy pulmonary artery pressure: 54/31 mmHg (mean 41 mmHg) Double angled pigtail catheter removed over exchange length Amplatz guidewire. Serial dilation was performed and 7 [place] sheath exchanged for [employer] 24 French sheath. Utilizing a Bern catheter, the guidewire was directed into a right lower lobe pulmonary artery branch. 1 [place] FlowTriver device was advanced over the wire into the lateral basilar segmental and Truncus Anterior branches of the right pulmonary arterial system. Mechanical aspiration thrombectomy was performed extirpating thrombus. Post thrombectomy pulmonary angiogram demonstrated significant reduction in overall clot burden in the right pulmonary arterial branches. The left lateral basilar segmental branch of the lower lobe artery was selected with the burn catheter. The 20 [place] FlowTriver device was advanced over the wire into the lateral basilar segmental branches of the left pulmonary arterial system. No significant thrombus could be removed with the 20 [place] device. The 20 French device was removed and the 24 [place] FlowTriver device was inserted. The tip was advanced into the main left pulmonary artery. The T20 FlowTriver device was then inserted through the 24 French device into the lateral basilar segment, and mechanical aspiration thrombectomy was performed extirpating thrombus. Post aspiration pulmonary angiogram demonstrated significant reduction in overall clot burden. Prior thrombectomy, the patient's blood pressure measured 87/67 mm Hg with a  heart rate of 107 beats per minute. Post thrombectomy, pressures improved to 117/64 mm Hg with heart rate of 81 beats per minute. Patient's oxygenation improved from 93% on  15 L by non-rebreather 200% on 10 L by non-rebreather. FlowTriever device was removed. Bern catheter was advanced through the cord dry seal to the infrarenal inferior vena cava and venogram was performed in order to delineate the origin of the renal veins. Bern catheter was removed over a guidewire and Denali IVC filter was deployed in the infrarenal vena cava. The [place] Dry Seal sheath was removed and hemostasis achieved with purse-string suture and manual compression. IMPRESSION: 1. Bilateral pulmonary angiogram and Inari sunction thrombectomy as above. 2. Denali IVC filter placed. Electronically Signed   By: Miachel Roux M.D.   On: 12/15/2020 18:38   IR IVC FILTER PLMT / S&I Burke Keels GUID/MOD SED  Result Date: 12/15/2020 INDICATION: 60 year old woman with sub massive pulmonary artery emboli with RV LV ratio of 3.4. Patient has a history of GBM and cannot receive tPA. Prior to the procedure patient was on non-rebreather at 15 L, with oxygen saturation of 93%. Interventional radiology consulted for catheter directed thrombectomy of pulmonary embolism. IVC filter also requested given history of lower extremity DVT and GBM. EXAM: 1. Ultrasound-guided access of right common femoral vein 2. Bilateral pulmonary venogram 3. Bilateral pulmonary suction thrombectomy 4. IVC filter placement COMPARISON:  CT angiography chest 12/14/2020 MEDICATIONS: None. ANESTHESIA/SEDATION: None. The patient was monitored by the anesthesia team throughout the procedure given their tenuous cardiovascular status. FLUOROSCOPY TIME:  Fluoroscopy Time: 53 minutes 42 seconds (132 mGy). COMPLICATIONS: None immediate. TECHNIQUE: Informed written consent was obtained from the patient after a thorough discussion of the procedural risks, benefits and alternatives. All questions were  addressed. Maximal Sterile Barrier Technique was utilized including caps, mask, sterile gowns, sterile gloves, sterile drape, hand hygiene and skin antiseptic. A timeout was performed prior to the initiation of the procedure. Right groin skin prepped and draped in usual fashion. The right common femoral vein was evaluated with ultrasound and shown to be patent. A permanent ultrasound image was obtained and placed in the patient's medical record. Using sterile gel and a sterile probe cover the right common femoral vein was entered with a 21 gauge needle during real time ultrasound guidance. 21 gauge needle exchanged for transitional dilator set over 0.018 in guidewire. Transitional dilator set exchanged for 6 French sheath over 0.035 in guidewire. Despite multiple attempts with single and double angle pigtail catheter, the pulmonary artery could not be accessed. The short 6 French sheath was exchanged for a 90 cm 7 [place] sheath. The tip of the sheath position just below the cavoatrial junction. The long 7 French sheath allowed for additional stability and the main pulmonary artery was accessed with a 6 [place] double angle pigtail catheter. Pre thrombectomy pulmonary artery pressure: 54/31 mmHg (mean 41 mmHg) Double angled pigtail catheter removed over exchange length Amplatz guidewire. Serial dilation was performed and 7 [place] sheath exchanged for [employer] 24 French sheath. Utilizing a Bern catheter, the guidewire was directed into a right lower lobe pulmonary artery branch. 27 [place] FlowTriver device was advanced over the wire into the lateral basilar segmental and Truncus Anterior branches of the right pulmonary arterial system. Mechanical aspiration thrombectomy was performed extirpating thrombus. Post thrombectomy pulmonary angiogram demonstrated significant reduction in overall clot burden in the right pulmonary arterial branches. The left lateral basilar segmental branch of the lower lobe artery  was selected with the burn catheter. The 20 [place] FlowTriver device was advanced over the wire into the lateral basilar segmental branches of the left pulmonary arterial system. No significant thrombus could be  removed with the 20 [place] device. The 20 French device was removed and the 24 [place] FlowTriver device was inserted. The tip was advanced into the main left pulmonary artery. The T20 FlowTriver device was then inserted through the 24 French device into the lateral basilar segment, and mechanical aspiration thrombectomy was performed extirpating thrombus. Post aspiration pulmonary angiogram demonstrated significant reduction in overall clot burden. Prior thrombectomy, the patient's blood pressure measured 87/67 mm Hg with a heart rate of 107 beats per minute. Post thrombectomy, pressures improved to 117/64 mm Hg with heart rate of 81 beats per minute. Patient's oxygenation improved from 93% on 15 L by non-rebreather 200% on 10 L by non-rebreather. FlowTriever device was removed. Bern catheter was advanced through the cord dry seal to the infrarenal inferior vena cava and venogram was performed in order to delineate the origin of the renal veins. Bern catheter was removed over a guidewire and Denali IVC filter was deployed in the infrarenal vena cava. The [place] Dry Seal sheath was removed and hemostasis achieved with purse-string suture and manual compression. IMPRESSION: 1. Bilateral pulmonary angiogram and Inari sunction thrombectomy as above. 2. Denali IVC filter placed. Electronically Signed   By: Miachel Roux M.D.   On: 12/15/2020 18:38   IR THROMBECT PRIM MECH INIT (INCLU) MOD SED  Result Date: 12/15/2020 INDICATION: 60 year old woman with sub massive pulmonary artery emboli with RV LV ratio of 3.4. Patient has a history of GBM and cannot receive tPA. Prior to the procedure patient was on non-rebreather at 15 L, with oxygen saturation of 93%. Interventional radiology consulted for catheter directed  thrombectomy of pulmonary embolism. IVC filter also requested given history of lower extremity DVT and GBM. EXAM: 1. Ultrasound-guided access of right common femoral vein 2. Bilateral pulmonary venogram 3. Bilateral pulmonary suction thrombectomy 4. IVC filter placement COMPARISON:  CT angiography chest 12/14/2020 MEDICATIONS: None. ANESTHESIA/SEDATION: None. The patient was monitored by the anesthesia team throughout the procedure given their tenuous cardiovascular status. FLUOROSCOPY TIME:  Fluoroscopy Time: 53 minutes 42 seconds (132 mGy). COMPLICATIONS: None immediate. TECHNIQUE: Informed written consent was obtained from the patient after a thorough discussion of the procedural risks, benefits and alternatives. All questions were addressed. Maximal Sterile Barrier Technique was utilized including caps, mask, sterile gowns, sterile gloves, sterile drape, hand hygiene and skin antiseptic. A timeout was performed prior to the initiation of the procedure. Right groin skin prepped and draped in usual fashion. The right common femoral vein was evaluated with ultrasound and shown to be patent. A permanent ultrasound image was obtained and placed in the patient's medical record. Using sterile gel and a sterile probe cover the right common femoral vein was entered with a 21 gauge needle during real time ultrasound guidance. 21 gauge needle exchanged for transitional dilator set over 0.018 in guidewire. Transitional dilator set exchanged for 6 French sheath over 0.035 in guidewire. Despite multiple attempts with single and double angle pigtail catheter, the pulmonary artery could not be accessed. The short 6 French sheath was exchanged for a 90 cm 7 [place] sheath. The tip of the sheath position just below the cavoatrial junction. The long 7 French sheath allowed for additional stability and the main pulmonary artery was accessed with a 6 [place] double angle pigtail catheter. Pre thrombectomy pulmonary artery pressure:  54/31 mmHg (mean 41 mmHg) Double angled pigtail catheter removed over exchange length Amplatz guidewire. Serial dilation was performed and 7 [place] sheath exchanged for [employer] 24 French sheath. Utilizing a [place]  catheter, the guidewire was directed into a right lower lobe pulmonary artery branch. 57 [place] FlowTriver device was advanced over the wire into the lateral basilar segmental and Truncus Anterior branches of the right pulmonary arterial system. Mechanical aspiration thrombectomy was performed extirpating thrombus. Post thrombectomy pulmonary angiogram demonstrated significant reduction in overall clot burden in the right pulmonary arterial branches. The left lateral basilar segmental branch of the lower lobe artery was selected with the burn catheter. The 20 [place] FlowTriver device was advanced over the wire into the lateral basilar segmental branches of the left pulmonary arterial system. No significant thrombus could be removed with the 20 [place] device. The 20 French device was removed and the 24 [place] FlowTriver device was inserted. The tip was advanced into the main left pulmonary artery. The T20 FlowTriver device was then inserted through the 24 French device into the lateral basilar segment, and mechanical aspiration thrombectomy was performed extirpating thrombus. Post aspiration pulmonary angiogram demonstrated significant reduction in overall clot burden. Prior thrombectomy, the patient's blood pressure measured 87/67 mm Hg with a heart rate of 107 beats per minute. Post thrombectomy, pressures improved to 117/64 mm Hg with heart rate of 81 beats per minute. Patient's oxygenation improved from 93% on 15 L by non-rebreather 200% on 10 L by non-rebreather. FlowTriever device was removed. Bern catheter was advanced through the cord dry seal to the infrarenal inferior vena cava and venogram was performed in order to delineate the origin of the renal veins. Bern catheter was removed over  a guidewire and Denali IVC filter was deployed in the infrarenal vena cava. The [place] Dry Seal sheath was removed and hemostasis achieved with purse-string suture and manual compression. IMPRESSION: 1. Bilateral pulmonary angiogram and Inari sunction thrombectomy as above. 2. Denali IVC filter placed. Electronically Signed   By: Miachel Roux M.D.   On: 12/15/2020 18:38   IR US Guide Vasc Access Right  Result Date: 12/15/2020 INDICATION: 60 year old woman with sub massive pulmonary artery emboli with RV LV ratio of 3.4. Patient has a history of GBM and cannot receive tPA. Prior to the procedure patient was on non-rebreather at 15 L, with oxygen saturation of 93%. Interventional radiology consulted for catheter directed thrombectomy of pulmonary embolism. IVC filter also requested given history of lower extremity DVT and GBM. EXAM: 1. Ultrasound-guided access of right common femoral vein 2. Bilateral pulmonary venogram 3. Bilateral pulmonary suction thrombectomy 4. IVC filter placement COMPARISON:  CT angiography chest 12/14/2020 MEDICATIONS: None. ANESTHESIA/SEDATION: None. The patient was monitored by the anesthesia team throughout the procedure given their tenuous cardiovascular status. FLUOROSCOPY TIME:  Fluoroscopy Time: 53 minutes 42 seconds (132 mGy). COMPLICATIONS: None immediate. TECHNIQUE: Informed written consent was obtained from the patient after a thorough discussion of the procedural risks, benefits and alternatives. All questions were addressed. Maximal Sterile Barrier Technique was utilized including caps, mask, sterile gowns, sterile gloves, sterile drape, hand hygiene and skin antiseptic. A timeout was performed prior to the initiation of the procedure. Right groin skin prepped and draped in usual fashion. The right common femoral vein was evaluated with ultrasound and shown to be patent. A permanent ultrasound image was obtained and placed in the patient's medical record. Using sterile gel and a  sterile probe cover the right common femoral vein was entered with a 21 gauge needle during real time ultrasound guidance. 21 gauge needle exchanged for transitional dilator set over 0.018 in guidewire. Transitional dilator set exchanged for 6 French sheath over 0.035 in guidewire. Despite  multiple attempts with single and double angle pigtail catheter, the pulmonary artery could not be accessed. The short 6 French sheath was exchanged for a 90 cm 7 [place] sheath. The tip of the sheath position just below the cavoatrial junction. The long 7 French sheath allowed for additional stability and the main pulmonary artery was accessed with a 6 [place] double angle pigtail catheter. Pre thrombectomy pulmonary artery pressure: 54/31 mmHg (mean 41 mmHg) Double angled pigtail catheter removed over exchange length Amplatz guidewire. Serial dilation was performed and 7 [place] sheath exchanged for [employer] 24 French sheath. Utilizing a Bern catheter, the guidewire was directed into a right lower lobe pulmonary artery branch. 36 [place] FlowTriver device was advanced over the wire into the lateral basilar segmental and Truncus Anterior branches of the right pulmonary arterial system. Mechanical aspiration thrombectomy was performed extirpating thrombus. Post thrombectomy pulmonary angiogram demonstrated significant reduction in overall clot burden in the right pulmonary arterial branches. The left lateral basilar segmental branch of the lower lobe artery was selected with the burn catheter. The 20 [place] FlowTriver device was advanced over the wire into the lateral basilar segmental branches of the left pulmonary arterial system. No significant thrombus could be removed with the 20 [place] device. The 20 French device was removed and the 24 [place] FlowTriver device was inserted. The tip was advanced into the main left pulmonary artery. The T20 FlowTriver device was then inserted through the 24 French device into the  lateral basilar segment, and mechanical aspiration thrombectomy was performed extirpating thrombus. Post aspiration pulmonary angiogram demonstrated significant reduction in overall clot burden. Prior thrombectomy, the patient's blood pressure measured 87/67 mm Hg with a heart rate of 107 beats per minute. Post thrombectomy, pressures improved to 117/64 mm Hg with heart rate of 81 beats per minute. Patient's oxygenation improved from 93% on 15 L by non-rebreather 200% on 10 L by non-rebreather. FlowTriever device was removed. Bern catheter was advanced through the cord dry seal to the infrarenal inferior vena cava and venogram was performed in order to delineate the origin of the renal veins. Bern catheter was removed over a guidewire and Denali IVC filter was deployed in the infrarenal vena cava. The [place] Dry Seal sheath was removed and hemostasis achieved with purse-string suture and manual compression. IMPRESSION: 1. Bilateral pulmonary angiogram and Inari sunction thrombectomy as above. 2. Denali IVC filter placed. Electronically Signed   By: Miachel Roux M.D.   On: 12/15/2020 18:38   EEG adult  Result Date: 12/15/2020 Lora Havens, MD     12/15/2020 10:32 AM Patient Name: ETHYL VILA MRN: [id number] Epilepsy Attending: Lora Havens Referring Physician/Provider: Dr Renee Pain Date: 12/15/2020 Duration: 23.35 mins Patient history: 60 yo with GBM. EEG to evaluate for seizure Level of alertness: Awake AEDs during EEG study: LEV Technical aspects: This EEG study was done with scalp electrodes positioned according to the 10-20 International system of electrode placement. Electrical activity was acquired at a sampling rate of 500Hz  and reviewed with a high frequency filter of 70Hz  and a low frequency filter of 1Hz . EEG data were recorded continuously and digitally stored. Description: The posterior dominant rhythm consists of 10 Hz activity of moderate voltage (25-35 uV) seen predominantly in  posterior head regions, symmetric and reactive to eye opening and eye closing. Hyperventilation and photic stimulation were not performed.   IMPRESSION: This study is within normal limits. No seizures or epileptiform discharges were seen throughout the recording. Priyanka Barbra Sarks  VAS [place] LOWER EXTREMITY VENOUS (DVT)  Result Date: 12/15/2020  Lower Venous DVT Study Indications: Pulmonary embolism.  Risk Factors: Cancer. Comparison Study: No prior studies. Performing Technologist: Oliver Hum RVT  Examination Guidelines: A complete evaluation includes B-mode imaging, spectral Doppler, color Doppler, and power Doppler as needed of all accessible portions of each vessel. Bilateral testing is considered an integral part of a complete examination. Limited examinations for reoccurring indications may be performed as noted. The reflux portion of the exam is performed with the patient in reverse Trendelenburg.  +---------+---------------+---------+-----------+----------+--------------+ RIGHT    CompressibilityPhasicitySpontaneityPropertiesThrombus Aging +---------+---------------+---------+-----------+----------+--------------+ CFV      Full           Yes      Yes                                 +---------+---------------+---------+-----------+----------+--------------+ SFJ      Full                                                        +---------+---------------+---------+-----------+----------+--------------+ FV Prox  Full                                                        +---------+---------------+---------+-----------+----------+--------------+ FV Mid   None           No       No                   Acute          +---------+---------------+---------+-----------+----------+--------------+ FV DistalFull                                                        +---------+---------------+---------+-----------+----------+--------------+ PFV      Full                                                         +---------+---------------+---------+-----------+----------+--------------+ POP      Full           Yes      Yes                                 +---------+---------------+---------+-----------+----------+--------------+ PTV      Full                                                        +---------+---------------+---------+-----------+----------+--------------+ PERO     Full                                                        +---------+---------------+---------+-----------+----------+--------------+   +---------+---------------+---------+-----------+----------+--------------+  LEFT     CompressibilityPhasicitySpontaneityPropertiesThrombus Aging +---------+---------------+---------+-----------+----------+--------------+ CFV      Full           Yes      Yes                                 +---------+---------------+---------+-----------+----------+--------------+ SFJ      Full                                                        +---------+---------------+---------+-----------+----------+--------------+ FV Prox  Full                                                        +---------+---------------+---------+-----------+----------+--------------+ FV Mid   None           No       No                   Acute          +---------+---------------+---------+-----------+----------+--------------+ FV DistalNone           No       No                   Acute          +---------+---------------+---------+-----------+----------+--------------+ PFV      Full                                                        +---------+---------------+---------+-----------+----------+--------------+ POP      Full           Yes      Yes                                 +---------+---------------+---------+-----------+----------+--------------+ PTV      Full                                                         +---------+---------------+---------+-----------+----------+--------------+ PERO     Full                                                        +---------+---------------+---------+-----------+----------+--------------+     Summary: RIGHT: - Findings consistent with acute deep vein thrombosis involving the right femoral vein. - No cystic structure found in the popliteal fossa.  LEFT: - Findings consistent with acute deep vein thrombosis involving the left femoral vein. - No cystic structure found in the popliteal fossa.  *See table(s) above for measurements and observations.    Preliminary

## 2020-12-16 NOTE — Anesthesia Postprocedure Evaluation (Signed)
Anesthesia Post Note  Patient: Hannah [name]  Procedure(s) Performed: IR WITH ANESTHESIA (N/A )     Patient location during evaluation: ICU Anesthesia Type: MAC Level of consciousness: awake and alert Pain management: pain level controlled Vital Signs Assessment: post-procedure vital signs reviewed and stable Respiratory status: spontaneous breathing, respiratory function stable and patient connected to face mask oxygen Cardiovascular status: stable and blood pressure returned to baseline Postop Assessment: no apparent nausea or vomiting Anesthetic complications: no   No complications documented.  Last Vitals:  Vitals:   12/16/20 0630 12/16/20 0700  BP: 99/70 109/66  Pulse: 61 63  Resp: 15 13  Temp:    SpO2: 100% 100%    Last Pain:  Vitals:   12/16/20 0400  TempSrc:   PainSc: 0-No pain                 [name]

## 2020-12-16 NOTE — Progress Notes (Signed)
SLP Cancellation Note  Patient Details Name: AMIE COWENS MRN: [id number] DOB: May 15, 1961   Cancelled treatment:       Reason Eval/Treat Not Completed: Other (comment). Pt known to the this therapist, swallowing well at baseline, RN reports no concerns as pt has been on regular diet and thin liquids and tolerating well. Will defer eval at this time.   Herbie Baltimore, MA [facility] Pager [phone] Office [phone]  Lynann Beaver 12/16/2020, 8:34 AM

## 2020-12-16 NOTE — Progress Notes (Signed)
ANTICOAGULATION CONSULT NOTE  Pharmacy Consult for Heparin Indication: pulmonary embolus  No Known Allergies  Patient Measurements: Height: 5\' 4"  (162.6 cm) Weight: 60.2 kg (132 lb 11.5 oz) IBW/kg (Calculated) : 54.7 Heparin Dosing Weight: 58 kg  Vital Signs: Temp: 98.1 F (36.7 C) (02/12 0800) Temp Source: Oral (02/12 0800) BP: 100/69 (02/12 1200) Pulse Rate: 69 (02/12 1200)  Labs: Recent Labs    12/14/20 2138 12/14/20 2145 12/15/20 0510 12/15/20 0740 12/15/20 1700 12/15/20 2043 12/15/20 2220 12/16/20 0225 12/16/20 0515 12/16/20 1358  HGB 12.1 11.9* 12.2  --   --   --   --  9.2*  --   --   HCT 36.5 35.0* 37.0  --   --   --   --  27.6*  --   --   PLT 135*  --  145*  --   --   --   --  117*  --   --   APTT 35  --   --  >200*  --   --  37*  --   --   --   LABPROT 15.3*  --   --   --   --   --   --   --   --   --   INR 1.3*  --   --   --   --   --   --   --   --   --   HEPARINUNFRC  --   --   --  SUBOPTIMAL SPECIMEN   < > 0.28*  --   --  0.30 0.17*  CREATININE 1.18* 1.00 0.60  --   --   --   --   --   --   --   TROPONINIHS 149*  --   --   --   --   --   --   --   --   --    < > = values in this interval not displayed.    Estimated Creatinine Clearance: 65.4 mL/min (by C-G formula based on SCr of 0.6 mg/dL).   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  Pharmacy is consulted to dose IV heparin for saddle PE and DVT.  Patient is s/p PE thrombectomy and IVC filter placement on 12/15/20.    Heparin level is sub-therapeutic at 0.17 units/mL.  RN reports possible short pauses due to IV site/line and she switched IV sites several hours prior to lab draw.  No bleeding reported.  Goal of Therapy:  Heparin level 0.3-0.7 units/ml Monitor platelets by anticoagulation protocol: Yes   Plan:  Increase heparin gtt conservatively to 950 units/hr Check 6 hr heparin level  Juanya Villavicencio D. Mina Marble, PharmD, BCPS, [place] 12/16/2020, 6:14 PM

## 2020-12-16 NOTE — Progress Notes (Signed)
Initial Nutrition Assessment  DOCUMENTATION CODES:   Not applicable  INTERVENTION:   Trial the following supplements:  ProSource Plus BID, each 30 mL supplement provides 100 kcals and 15 grams protein.   Ensure Max po BID, each supplement provides 150 kcal and 30 grams of protein.    NUTRITION DIAGNOSIS:   Increased nutrient needs related to cancer and cancer related treatments as evidenced by estimated needs.  GOAL:   Patient will meet greater than or equal to 90% of their needs   MONITOR:   PO intake,Supplement acceptance,Weight trends  REASON FOR ASSESSMENT:   Malnutrition Screening Tool    ASSESSMENT:  .  60 yo female admitted with acute respiratory failure with large saddle PE. Pt with left frontal glioblastoma s/p craniotomy on 02/17/20, received radiation and currently on chemotherapy. PMH includes seizure disorder.  2/11 Pulmonary angiogram with  Bilateral thrombectomy and IVC filter placement  No recorded po intake; RN reports pt with good appetite. Pt ate cereal and fruit for breakfast this AM.    Current wt 60.2 kg; weight appears relatively stable over the last several months  Noted pt seen by Outpatient Oncology RD on 09/26/20 with no follow-up scheduled. At that time, pt had been experiencing wt loss, part of which was intentional. Pt indicating following a ketogenic diet but diet recall was fruit, yogurt and eggs for breakfast and soup for lunch/dinner. Pt weight 135 pounds in November 2021, down from 154 pounds April 2021. Current wt 132 pounds.   Based on previous diet recall, diet is low in protein. Plan to add high protein supplement  Labs: reviewed Meds: Vitamin D3, decadron, ss novolog  Diet Order:   Diet Order            Diet regular Room service appropriate? Yes; Fluid consistency: Thin  Diet effective now                 EDUCATION NEEDS:   No education needs have been identified at this time  Skin:  Skin Assessment: Skin Integrity  Issues: Skin Integrity Issues:: Incisions Incisions: groin  Last BM:  PTA  Height:   Ht Readings from Last 1 Encounters:  12/14/20 5\' 4"  (1.626 m)    Weight:   Wt Readings from Last 1 Encounters:  12/16/20 60.2 kg   BMI:  Body mass index is 22.78 kg/m.  Estimated Nutritional Needs:   Kcal:  2000-2200 kcals  Protein:  100-115 g  Fluid:  >/= 2 L    Kerman Passey MS, RDN, LDN, CNSC Registered Dietitian III Clinical Nutrition RD Pager and On-Call Pager Number Located in [place]

## 2020-12-16 NOTE — Progress Notes (Signed)
ANTICOAGULATION CONSULT NOTE  Pharmacy Consult for Heparin Indication: pulmonary embolus  No Known Allergies  Patient Measurements: Height: 5\' 4"  (162.6 cm) Weight: 60.2 kg (132 lb 11.5 oz) IBW/kg (Calculated) : 54.7 Heparin Dosing Weight: 58 kg  Vital Signs: Temp: 98.2 F (36.8 C) (02/12 0318) Temp Source: Oral (02/12 0318) BP: 99/70 (02/12 0630) Pulse Rate: 61 (02/12 0630)  Labs: Recent Labs    12/14/20 2138 12/14/20 2145 12/15/20 0510 12/15/20 0740 12/15/20 0740 12/15/20 1700 12/15/20 2043 12/15/20 2220 12/16/20 0225 12/16/20 0515  HGB 12.1 11.9* 12.2  --   --   --   --   --  9.2*  --   HCT 36.5 35.0* 37.0  --   --   --   --   --  27.6*  --   PLT 135*  --  145*  --   --   --   --   --  117*  --   APTT 35  --   --  >200*  --   --   --  37*  --   --   LABPROT 15.3*  --   --   --   --   --   --   --   --   --   INR 1.3*  --   --   --   --   --   --   --   --   --   HEPARINUNFRC  --   --   --  SUBOPTIMAL SPECIMEN   < > 1.50* 0.28*  --   --  0.30  CREATININE 1.18* 1.00 0.60  --   --   --   --   --   --   --   TROPONINIHS 149*  --   --   --   --   --   --   --   --   --    < > = values in this interval not displayed.    Estimated Creatinine Clearance: 65.4 mL/min (by C-G formula based on SCr of 0.6 mg/dL).   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  Pharmacy is consulted to dose IV heparin for saddle PE and DVT.  Patient is s/p PE thrombectomy and IVC filter placement today 12/15/20.  Spoke to Dr. Dwaine Gale, heparin 5000 units was given in IR, so heparin infusion was turned off as MD expected heparin level to be elevated.  Heparin level was checked about 1.5 hours after the bolus and it is elevated at 1.5 units/mL.  Okay to resume IV heparin per discussion with MD; however, will repeat a heparin level to make sure it is appropriate before resuming IV heparin.  No bleeding as of now per RN.  Heparin came back in goal range  this AM but on the lower range. We will increase it slightly and recheck another level. Hgb low at 9.2 and plts 117  Goal of Therapy:  Heparin level 0.3-0.7 units/ml Monitor platelets by anticoagulation protocol: Yes   Plan:   Increase heparin to 850 units/hr Repeat HL in 6 hrs  Onnie Boer, PharmD, [place], AAHIVP, CPP Infectious Disease Pharmacist 12/16/2020 7:09 AM

## 2020-12-17 DIAGNOSIS — I2699 Other pulmonary embolism without acute cor pulmonale: Secondary | ICD-10-CM | POA: Diagnosis not present

## 2020-12-17 LAB — CBC
HCT: 27 % — ABNORMAL LOW (ref 36.0–46.0)
Hemoglobin: 9 g/dL — ABNORMAL LOW (ref 12.0–15.0)
MCH: 32.8 pg (ref 26.0–34.0)
MCHC: 33.3 g/dL (ref 30.0–36.0)
MCV: 98.5 fL (ref 80.0–100.0)
Platelets: 125 10*3/uL — ABNORMAL LOW (ref 150–400)
RBC: 2.74 MIL/uL — ABNORMAL LOW (ref 3.87–5.11)
RDW: 11.9 % (ref 11.5–15.5)
WBC: 5 10*3/uL (ref 4.0–10.5)
nRBC: 0 % (ref 0.0–0.2)

## 2020-12-17 LAB — COMPREHENSIVE METABOLIC PANEL
ALT: 66 U/L — ABNORMAL HIGH (ref 0–44)
AST: 27 U/L (ref 15–41)
Albumin: 2.5 g/dL — ABNORMAL LOW (ref 3.5–5.0)
Alkaline Phosphatase: 56 U/L (ref 38–126)
Anion gap: 9 (ref 5–15)
BUN: 15 mg/dL (ref 6–20)
CO2: 25 mmol/L (ref 22–32)
Calcium: 8.3 mg/dL — ABNORMAL LOW (ref 8.9–10.3)
Chloride: 106 mmol/L (ref 98–111)
Creatinine, Ser: 0.81 mg/dL (ref 0.44–1.00)
GFR, Estimated: >60 mL/min (ref >60)
Glucose, Bld: 94 mg/dL (ref 70–99)
Potassium: 3.6 mmol/L (ref 3.5–5.1)
Sodium: 140 mmol/L (ref 135–145)
Total Bilirubin: 0.3 mg/dL (ref 0.3–1.2)
Total Protein: 4.8 g/dL — ABNORMAL LOW (ref 6.5–8.1)

## 2020-12-17 LAB — HEPARIN LEVEL (UNFRACTIONATED)
Heparin Unfractionated: >2.2 IU/mL — ABNORMAL HIGH (ref 0.30–0.70)
Heparin Unfractionated: 0.45 IU/mL (ref 0.30–0.70)

## 2020-12-17 LAB — TROPONIN I (HIGH SENSITIVITY): Troponin I (High Sensitivity): 87 ng/L — ABNORMAL HIGH (ref <18)

## 2020-12-17 LAB — MAGNESIUM: Magnesium: 2 mg/dL (ref 1.7–2.4)

## 2020-12-17 LAB — PHOSPHORUS: Phosphorus: 3 mg/dL (ref 2.5–4.6)

## 2020-12-17 LAB — PROTIME-INR
INR: 1.2 (ref 0.8–1.2)
Prothrombin Time: 14.8 seconds (ref 11.4–15.2)

## 2020-12-17 LAB — LACTIC ACID, PLASMA: Lactic Acid, Venous: 1.4 mmol/L (ref 0.5–1.9)

## 2020-12-17 MED ORDER — APIXABAN 5 MG PO TABS
10.0000 mg | ORAL_TABLET | Freq: Two times a day (BID) | ORAL | Status: DC
  Administered 2020-12-17 – 2020-12-19 (×5): 10 mg via ORAL
  Filled 2020-12-17 (×5): qty 2

## 2020-12-17 MED ORDER — ACETAMINOPHEN 325 MG PO TABS
325.0000 mg | ORAL_TABLET | Freq: Four times a day (QID) | ORAL | Status: DC | PRN
  Administered 2020-12-17 – 2020-12-18 (×4): 325 mg via ORAL
  Filled 2020-12-17 (×4): qty 1

## 2020-12-17 MED ORDER — APIXABAN 5 MG PO TABS: 5.0000 mg | ORAL_TABLET | Freq: Two times a day (BID) | ORAL | Status: DC

## 2020-12-17 MED ORDER — PANTOPRAZOLE SODIUM 40 MG PO TBEC
40.0000 mg | DELAYED_RELEASE_TABLET | Freq: Two times a day (BID) | ORAL | Status: DC
  Administered 2020-12-17 – 2020-12-19 (×4): 40 mg via ORAL
  Filled 2020-12-17 (×4): qty 1

## 2020-12-17 NOTE — Progress Notes (Addendum)
PROGRESS NOTE    Hannah [name]  HYW:[id number]  DOB: 1961-01-27  DOA: 12/14/2020 PCP: [name], DO Outpatient Specialists:   Hospital course:  60 year old female with glioblastoma multiforme A diagnosed in 2021 is undergoing chemotherapy was admitted 12/14/1020 with shortness of breath and diagnosed with saddle embolus.  She was mildly hypotensive and required a nonrebreather.  She was not considered a candidate for thrombolytics due to glioblastoma.  Patient underwent thrombectomy and IVC placement on 12/15/2020 by IR and placed on IV heparin.  Patient did well and was transitioned to new liters nasal cannula and picked up by [facility] hospitalist on 12/17/2020.   Subjective:   Patient feels that she is doing well.  Notes her shortness of breath is much improved.  Is feeling a little dizzy but attributes that to not eating yet this morning.  No other complaints.  No cough.   Objective: Vitals:   12/17/20 0300 12/17/20 0400 12/17/20 0900 12/17/20 1250  BP: 120/88 109/79 118/84   Pulse: (!) 59 (!) 52 69 90  Resp: 20 15 20  (!) 21  Temp:  97.6 F (36.4 C) (!) 97.5 F (36.4 C)   TempSrc:   Axillary   SpO2: 97% 95% 98% 97%  Weight:      Height:        Intake/Output Summary (Last 24 hours) at 12/17/2020 1503 Last data filed at 12/17/2020 1300 Gross per 24 hour  Intake 611.89 ml  Output 2290 ml  Net -1678.11 ml   Filed Weights   12/14/20 2037 12/16/20 0218  Weight: 58 kg 60.2 kg     Exam:  General: Well-appearing female looking younger than stated age sitting up in bed in no acute distress. Eyes: sclera anicteric, conjuctiva mild injection bilaterally CVS: S1-S2, regular  Respiratory:  decreased air entry bilaterally secondary to decreased inspiratory effort, rales at bases  GI: NABS, soft, NT  LE: No edema.  Neuro: A/O x 3, Moving all extremities equally with normal strength, CN 3-12 intact, grossly nonfocal.  Psych: patient is logical and coherent, judgement  and insight appear normal, mood and affect appropriate to situation.   Assessment & Plan:   Saddle embolus/PE Patient is status post thrombectomy and IVC placement 12/15/2020 Patient is received 3 days of IV heparin Will transition patient to Eliquis today as it has been difficult getting blood to monitor PTT and would want to avoid placing PICC line if not necessary. Continue 2 L nasal cannula Echocardiogram showed normal EF of 60 to 65% with no RV strain and normal pulmonary artery pressures.  Essentially normal echocardiogram.  Glioblastoma multiforme Undergoing chemotherapy at the cancer center Continue Temodar and dexamethasone per home doses Continue Keppra as started in the ICU.  Transaminitis Normalized after treatment  Anemia Patient was admitted with a hemoglobin of 12 which subsequently decreased to 9 yesterday of unclear etiology.  Patient did have transient episode of hypotension which improved with hydration. Blood pressure has normalized and and hemoglobin has been stabilized at 9 for 24 hours now.   DVT prophylaxis: On Eliquis Code Status: DNR Family Communication: None today Disposition Plan:   Patient is from: Home  Anticipated Discharge Location: Home  Barriers to Discharge: Transitioning to anticoagulation  Is patient medically stable for Discharge: Not yet, can probably go home tomorrow   Consultants:  PCCM  Procedures:  Thrombectomy and IVC placement on 12/15/2020  Antimicrobials:  None   Data Reviewed:  Basic Metabolic Panel: Recent Labs  Lab 12/14/20 1207 12/14/20  2138 12/14/20 2145 12/15/20 0510 12/17/20 0352  NA 141 136 135 143 140  K 4.0 4.8 4.8 3.8 3.6  CL 104 102 102 113* 106  CO2 26 20*  --  19* 25  GLUCOSE 118* 280* 268* 124* 94  BUN 13 21* 21* 18 15  CREATININE 0.87 1.18* 1.00 0.60 0.81  CALCIUM 9.4 8.3*  --  7.7* 8.3*  MG  --  2.2  --  1.9 2.0  PHOS  --   --   --   --  3.0   Liver Function Tests: Recent Labs  Lab  12/14/20 1207 12/14/20 2138 12/17/20 0352  AST 13* 141* 27  ALT 9 92* 66*  ALKPHOS 62 81 56  BILITOT 0.5 0.9 0.3  PROT 6.8 5.9* 4.8*  ALBUMIN 3.5 3.0* 2.5*   No results for input(s): LIPASE, AMYLASE in the last 168 hours. No results for input(s): AMMONIA in the last 168 hours. CBC: Recent Labs  Lab 12/14/20 1207 12/14/20 1207 12/14/20 2138 12/14/20 2145 12/15/20 0510 12/16/20 0225 12/16/20 1315 12/17/20 0352  WBC 7.8  --  11.4*  --  7.0 6.1 7.9 5.0  NEUTROABS 6.6  --  10.3*  --   --   --   --   --   HGB 13.7   < > 12.1 11.9* 12.2 9.2* 9.6* 9.0*  HCT 39.5  --  36.5 35.0* 37.0 27.6* 28.7* 27.0*  MCV 94.7  --  99.7  --  99.7 97.5 97.6 98.5  PLT 137*  --  135*  --  145* 117* 125* 125*   < > = values in this interval not displayed.   Cardiac Enzymes: No results for input(s): CKTOTAL, CKMB, CKMBINDEX, TROPONINI in the last 168 hours. BNP (last 3 results) No results for input(s): PROBNP in the last 8760 hours. CBG: Recent Labs  Lab 12/15/20 1938 12/15/20 2343 12/16/20 0317 12/16/20 0725 12/16/20 1158  GLUCAP 113* 92 92 86 78    Recent Results (from the past 240 hour(s))  Novel Coronavirus, NAA ([facility])     Status: None   Collection Time: 12/07/20  4:11 PM   Specimen: Nasopharyngeal(NP) swabs in vial transport medium   Nasopharynge  Result Value Ref Range Status   SARS-CoV-2, NAA Not Detected Not Detected Final    Comment: This nucleic acid amplification test was developed and its performance characteristics determined by [employer]. Nucleic acid amplification tests include RT-PCR and TMA. This test has not been FDA cleared or approved. This test has been authorized by FDA under an Emergency Use Authorization (EUA). This test is only authorized for the duration of time the declaration that circumstances exist justifying the authorization of the emergency use of in vitro diagnostic tests for detection of SARS-CoV-2 virus and/or diagnosis of COVID-19  infection under section 564(b)(1) of the Act, 21 U.S.C. 017PZW-2(H) (1), unless the authorization is terminated or revoked sooner. When diagnostic testing is negative, the possibility of a false negative result should be considered in the context of a patient's recent exposures and the presence of clinical signs and symptoms consistent with COVID-19. An individual without symptoms of COVID-19 and who is not shedding SARS-CoV-2 virus wo uld expect to have a negative (not detected) result in this assay.   Upper Respiratory Culture     Status: None   Collection Time: 12/07/20  4:11 PM   Specimen: Throat  Result Value Ref Range Status   MICRO NUMBER: [id number]  Final   SPECIMEN QUALITY: Adequate  Final  Source THROAT  Final   STATUS: FINAL  Final   Result: No oropharyngeal pathogens recovered.  Final  SARS-COV-2, NAA 2 DAY TAT     Status: None   Collection Time: 12/07/20  4:11 PM   Nasopharynge  Result Value Ref Range Status   SARS-CoV-2, NAA 2 DAY TAT Performed  Final  Resp Panel by RT-PCR (Flu A&B, Covid) Nasopharyngeal Swab     Status: None   Collection Time: 12/14/20 11:21 PM   Specimen: Nasopharyngeal Swab; Nasopharyngeal(NP) swabs in vial transport medium  Result Value Ref Range Status   SARS Coronavirus 2 by RT PCR NEGATIVE NEGATIVE Final    Comment: (NOTE) SARS-CoV-2 target nucleic acids are NOT DETECTED.  The SARS-CoV-2 RNA is generally detectable in upper respiratory specimens during the acute phase of infection. The lowest concentration of SARS-CoV-2 viral copies this assay can detect is 138 copies/mL. A negative result does not preclude SARS-Cov-2 infection and should not be used as the sole basis for treatment or other patient management decisions. A negative result may occur with  improper specimen collection/handling, submission of specimen other than nasopharyngeal swab, presence of viral mutation(s) within the areas targeted by this assay, and inadequate number of  viral copies(<138 copies/mL). A negative result must be combined with clinical observations, patient history, and epidemiological information. The expected result is Negative.  Fact Sheet for Patients:  EntrepreneurPulse.com.au  Fact Sheet for Healthcare Providers:  IncredibleEmployment.be  This test is no t yet approved or cleared by the [place] FDA and  has been authorized for detection and/or diagnosis of SARS-CoV-2 by FDA under an Emergency Use Authorization (EUA). This EUA will remain  in effect (meaning this test can be used) for the duration of the COVID-19 declaration under Section 564(b)(1) of the Act, 21 U.S.C.section 360bbb-3(b)(1), unless the authorization is terminated  or revoked sooner.       Influenza A by PCR NEGATIVE NEGATIVE Final   Influenza B by PCR NEGATIVE NEGATIVE Final    Comment: (NOTE) The Xpert Xpress SARS-CoV-2/FLU/RSV plus assay is intended as an aid in the diagnosis of influenza from Nasopharyngeal swab specimens and should not be used as a sole basis for treatment. Nasal washings and aspirates are unacceptable for Xpert Xpress SARS-CoV-2/FLU/RSV testing.  Fact Sheet for Patients: EntrepreneurPulse.com.au  Fact Sheet for Healthcare Providers: IncredibleEmployment.be  This test is not yet approved or cleared by the [place] FDA and has been authorized for detection and/or diagnosis of SARS-CoV-2 by FDA under an Emergency Use Authorization (EUA). This EUA will remain in effect (meaning this test can be used) for the duration of the COVID-19 declaration under Section 564(b)(1) of the Act, 21 U.S.C. section 360bbb-3(b)(1), unless the authorization is terminated or revoked.  Performed at [facility], [place] [address], [place], [place] [address]       Studies: IR Angiogram Pulmonary Bilateral Selective  Result Date: 12/15/2020 INDICATION:  60 year old woman with sub massive pulmonary artery emboli with RV LV ratio of 3.4. Patient has a history of GBM and cannot receive tPA. Prior to the procedure patient was on non-rebreather at 15 L, with oxygen saturation of 93%. Interventional radiology consulted for catheter directed thrombectomy of pulmonary embolism. IVC filter also requested given history of lower extremity DVT and GBM. EXAM: 1. Ultrasound-guided access of right common femoral vein 2. Bilateral pulmonary venogram 3. Bilateral pulmonary suction thrombectomy 4. IVC filter placement COMPARISON:  CT angiography chest 12/14/2020 MEDICATIONS: None. ANESTHESIA/SEDATION: None. The patient was monitored by the  anesthesia team throughout the procedure given their tenuous cardiovascular status. FLUOROSCOPY TIME:  Fluoroscopy Time: 53 minutes 42 seconds (132 mGy). COMPLICATIONS: None immediate. TECHNIQUE: Informed written consent was obtained from the patient after a thorough discussion of the procedural risks, benefits and alternatives. All questions were addressed. Maximal Sterile Barrier Technique was utilized including caps, mask, sterile gowns, sterile gloves, sterile drape, hand hygiene and skin antiseptic. A timeout was performed prior to the initiation of the procedure. Right groin skin prepped and draped in usual fashion. The right common femoral vein was evaluated with ultrasound and shown to be patent. A permanent ultrasound image was obtained and placed in the patient's medical record. Using sterile gel and a sterile probe cover the right common femoral vein was entered with a 21 gauge needle during real time ultrasound guidance. 21 gauge needle exchanged for transitional dilator set over 0.018 in guidewire. Transitional dilator set exchanged for 6 French sheath over 0.035 in guidewire. Despite multiple attempts with single and double angle pigtail catheter, the pulmonary artery could not be accessed. The short 6 French sheath was exchanged for  a 90 cm 7 [place] sheath. The tip of the sheath position just below the cavoatrial junction. The long 7 French sheath allowed for additional stability and the main pulmonary artery was accessed with a 6 [place] double angle pigtail catheter. Pre thrombectomy pulmonary artery pressure: 54/31 mmHg (mean 41 mmHg) Double angled pigtail catheter removed over exchange length Amplatz guidewire. Serial dilation was performed and 7 [place] sheath exchanged for [employer] 24 French sheath. Utilizing a Bern catheter, the guidewire was directed into a right lower lobe pulmonary artery branch. 16 [place] FlowTriver device was advanced over the wire into the lateral basilar segmental and Truncus Anterior branches of the right pulmonary arterial system. Mechanical aspiration thrombectomy was performed extirpating thrombus. Post thrombectomy pulmonary angiogram demonstrated significant reduction in overall clot burden in the right pulmonary arterial branches. The left lateral basilar segmental branch of the lower lobe artery was selected with the burn catheter. The 20 [place] FlowTriver device was advanced over the wire into the lateral basilar segmental branches of the left pulmonary arterial system. No significant thrombus could be removed with the 20 [place] device. The 20 French device was removed and the 24 [place] FlowTriver device was inserted. The tip was advanced into the main left pulmonary artery. The T20 FlowTriver device was then inserted through the 24 French device into the lateral basilar segment, and mechanical aspiration thrombectomy was performed extirpating thrombus. Post aspiration pulmonary angiogram demonstrated significant reduction in overall clot burden. Prior thrombectomy, the patient's blood pressure measured 87/67 mm Hg with a heart rate of 107 beats per minute. Post thrombectomy, pressures improved to 117/64 mm Hg with heart rate of 81 beats per minute. Patient's oxygenation improved from 93% on 15 L  by non-rebreather 200% on 10 L by non-rebreather. FlowTriever device was removed. Bern catheter was advanced through the cord dry seal to the infrarenal inferior vena cava and venogram was performed in order to delineate the origin of the renal veins. Bern catheter was removed over a guidewire and Denali IVC filter was deployed in the infrarenal vena cava. The [place] Dry Seal sheath was removed and hemostasis achieved with purse-string suture and manual compression. IMPRESSION: 1. Bilateral pulmonary angiogram and Inari sunction thrombectomy as above. 2. Denali IVC filter placed. Electronically Signed   By: Miachel Roux M.D.   On: 12/15/2020 18:38   IR Angiogram Selective Each Additional Vessel  Result Date: 12/15/2020 INDICATION: 60 year old woman with sub massive pulmonary artery emboli with RV LV ratio of 3.4. Patient has a history of GBM and cannot receive tPA. Prior to the procedure patient was on non-rebreather at 15 L, with oxygen saturation of 93%. Interventional radiology consulted for catheter directed thrombectomy of pulmonary embolism. IVC filter also requested given history of lower extremity DVT and GBM. EXAM: 1. Ultrasound-guided access of right common femoral vein 2. Bilateral pulmonary venogram 3. Bilateral pulmonary suction thrombectomy 4. IVC filter placement COMPARISON:  CT angiography chest 12/14/2020 MEDICATIONS: None. ANESTHESIA/SEDATION: None. The patient was monitored by the anesthesia team throughout the procedure given their tenuous cardiovascular status. FLUOROSCOPY TIME:  Fluoroscopy Time: 53 minutes 42 seconds (132 mGy). COMPLICATIONS: None immediate. TECHNIQUE: Informed written consent was obtained from the patient after a thorough discussion of the procedural risks, benefits and alternatives. All questions were addressed. Maximal Sterile Barrier Technique was utilized including caps, mask, sterile gowns, sterile gloves, sterile drape, hand hygiene and skin antiseptic. A timeout was  performed prior to the initiation of the procedure. Right groin skin prepped and draped in usual fashion. The right common femoral vein was evaluated with ultrasound and shown to be patent. A permanent ultrasound image was obtained and placed in the patient's medical record. Using sterile gel and a sterile probe cover the right common femoral vein was entered with a 21 gauge needle during real time ultrasound guidance. 21 gauge needle exchanged for transitional dilator set over 0.018 in guidewire. Transitional dilator set exchanged for 6 French sheath over 0.035 in guidewire. Despite multiple attempts with single and double angle pigtail catheter, the pulmonary artery could not be accessed. The short 6 French sheath was exchanged for a 90 cm 7 [place] sheath. The tip of the sheath position just below the cavoatrial junction. The long 7 French sheath allowed for additional stability and the main pulmonary artery was accessed with a 6 [place] double angle pigtail catheter. Pre thrombectomy pulmonary artery pressure: 54/31 mmHg (mean 41 mmHg) Double angled pigtail catheter removed over exchange length Amplatz guidewire. Serial dilation was performed and 7 [place] sheath exchanged for [employer] 24 French sheath. Utilizing a Bern catheter, the guidewire was directed into a right lower lobe pulmonary artery branch. 70 [place] FlowTriver device was advanced over the wire into the lateral basilar segmental and Truncus Anterior branches of the right pulmonary arterial system. Mechanical aspiration thrombectomy was performed extirpating thrombus. Post thrombectomy pulmonary angiogram demonstrated significant reduction in overall clot burden in the right pulmonary arterial branches. The left lateral basilar segmental branch of the lower lobe artery was selected with the burn catheter. The 20 [place] FlowTriver device was advanced over the wire into the lateral basilar segmental branches of the left pulmonary arterial  system. No significant thrombus could be removed with the 20 [place] device. The 20 French device was removed and the 24 [place] FlowTriver device was inserted. The tip was advanced into the main left pulmonary artery. The T20 FlowTriver device was then inserted through the 24 [place] device into the lateral basilar segment, and mechanical aspiration thrombectomy was performed extirpating thrombus. Post aspiration pulmonary angiogram demonstrated significant reduction in overall clot burden. Prior thrombectomy, the patient's blood pressure measured 87/67 mm Hg with a heart rate of 107 beats per minute. Post thrombectomy, pressures improved to 117/64 mm Hg with heart rate of 81 beats per minute. Patient's oxygenation improved from 93% on 15 L by non-rebreather 200% on 10 L by non-rebreather. FlowTriever device  was removed. Bern catheter was advanced through the cord dry seal to the infrarenal inferior vena cava and venogram was performed in order to delineate the origin of the renal veins. Bern catheter was removed over a guidewire and Denali IVC filter was deployed in the infrarenal vena cava. The [place] Dry Seal sheath was removed and hemostasis achieved with purse-string suture and manual compression. IMPRESSION: 1. Bilateral pulmonary angiogram and Inari sunction thrombectomy as above. 2. Denali IVC filter placed. Electronically Signed   By: Miachel Roux M.D.   On: 12/15/2020 18:38   IR Angiogram Selective Each Additional Vessel  Result Date: 12/15/2020 INDICATION: 59 year old woman with sub massive pulmonary artery emboli with RV LV ratio of 3.4. Patient has a history of GBM and cannot receive tPA. Prior to the procedure patient was on non-rebreather at 15 L, with oxygen saturation of 93%. Interventional radiology consulted for catheter directed thrombectomy of pulmonary embolism. IVC filter also requested given history of lower extremity DVT and GBM. EXAM: 1. Ultrasound-guided access of right common femoral vein  2. Bilateral pulmonary venogram 3. Bilateral pulmonary suction thrombectomy 4. IVC filter placement COMPARISON:  CT angiography chest 12/14/2020 MEDICATIONS: None. ANESTHESIA/SEDATION: None. The patient was monitored by the anesthesia team throughout the procedure given their tenuous cardiovascular status. FLUOROSCOPY TIME:  Fluoroscopy Time: 53 minutes 42 seconds (132 mGy). COMPLICATIONS: None immediate. TECHNIQUE: Informed written consent was obtained from the patient after a thorough discussion of the procedural risks, benefits and alternatives. All questions were addressed. Maximal Sterile Barrier Technique was utilized including caps, mask, sterile gowns, sterile gloves, sterile drape, hand hygiene and skin antiseptic. A timeout was performed prior to the initiation of the procedure. Right groin skin prepped and draped in usual fashion. The right common femoral vein was evaluated with ultrasound and shown to be patent. A permanent ultrasound image was obtained and placed in the patient's medical record. Using sterile gel and a sterile probe cover the right common femoral vein was entered with a 21 gauge needle during real time ultrasound guidance. 21 gauge needle exchanged for transitional dilator set over 0.018 in guidewire. Transitional dilator set exchanged for 6 French sheath over 0.035 in guidewire. Despite multiple attempts with single and double angle pigtail catheter, the pulmonary artery could not be accessed. The short 6 French sheath was exchanged for a 90 cm 7 [place] sheath. The tip of the sheath position just below the cavoatrial junction. The long 7 French sheath allowed for additional stability and the main pulmonary artery was accessed with a 6 [place] double angle pigtail catheter. Pre thrombectomy pulmonary artery pressure: 54/31 mmHg (mean 41 mmHg) Double angled pigtail catheter removed over exchange length Amplatz guidewire. Serial dilation was performed and 7 [place] sheath exchanged for [employer] 24 French sheath. Utilizing a Bern catheter, the guidewire was directed into a right lower lobe pulmonary artery branch. 69 [place] FlowTriver device was advanced over the wire into the lateral basilar segmental and Truncus Anterior branches of the right pulmonary arterial system. Mechanical aspiration thrombectomy was performed extirpating thrombus. Post thrombectomy pulmonary angiogram demonstrated significant reduction in overall clot burden in the right pulmonary arterial branches. The left lateral basilar segmental branch of the lower lobe artery was selected with the burn catheter. The 20 [place] FlowTriver device was advanced over the wire into the lateral basilar segmental branches of the left pulmonary arterial system. No significant thrombus could be removed with the 20 [place] device. The 20 French device was removed and the 24  [place] FlowTriver device was inserted. The tip was advanced into the main left pulmonary artery. The T20 FlowTriver device was then inserted through the 24 French device into the lateral basilar segment, and mechanical aspiration thrombectomy was performed extirpating thrombus. Post aspiration pulmonary angiogram demonstrated significant reduction in overall clot burden. Prior thrombectomy, the patient's blood pressure measured 87/67 mm Hg with a heart rate of 107 beats per minute. Post thrombectomy, pressures improved to 117/64 mm Hg with heart rate of 81 beats per minute. Patient's oxygenation improved from 93% on 15 L by non-rebreather 200% on 10 L by non-rebreather. FlowTriever device was removed. Bern catheter was advanced through the cord dry seal to the infrarenal inferior vena cava and venogram was performed in order to delineate the origin of the renal veins. Bern catheter was removed over a guidewire and Denali IVC filter was deployed in the infrarenal vena cava. The [place] Dry Seal sheath was removed and hemostasis achieved with purse-string suture and manual  compression. IMPRESSION: 1. Bilateral pulmonary angiogram and Inari sunction thrombectomy as above. 2. Denali IVC filter placed. Electronically Signed   By: Miachel Roux M.D.   On: 12/15/2020 18:38   IR IVC FILTER PLMT / S&I Burke Keels GUID/MOD SED  Result Date: 12/15/2020 INDICATION: 60 year old woman with sub massive pulmonary artery emboli with RV LV ratio of 3.4. Patient has a history of GBM and cannot receive tPA. Prior to the procedure patient was on non-rebreather at 15 L, with oxygen saturation of 93%. Interventional radiology consulted for catheter directed thrombectomy of pulmonary embolism. IVC filter also requested given history of lower extremity DVT and GBM. EXAM: 1. Ultrasound-guided access of right common femoral vein 2. Bilateral pulmonary venogram 3. Bilateral pulmonary suction thrombectomy 4. IVC filter placement COMPARISON:  CT angiography chest 12/14/2020 MEDICATIONS: None. ANESTHESIA/SEDATION: None. The patient was monitored by the anesthesia team throughout the procedure given their tenuous cardiovascular status. FLUOROSCOPY TIME:  Fluoroscopy Time: 53 minutes 42 seconds (132 mGy). COMPLICATIONS: None immediate. TECHNIQUE: Informed written consent was obtained from the patient after a thorough discussion of the procedural risks, benefits and alternatives. All questions were addressed. Maximal Sterile Barrier Technique was utilized including caps, mask, sterile gowns, sterile gloves, sterile drape, hand hygiene and skin antiseptic. A timeout was performed prior to the initiation of the procedure. Right groin skin prepped and draped in usual fashion. The right common femoral vein was evaluated with ultrasound and shown to be patent. A permanent ultrasound image was obtained and placed in the patient's medical record. Using sterile gel and a sterile probe cover the right common femoral vein was entered with a 21 gauge needle during real time ultrasound guidance. 21 gauge needle exchanged for  transitional dilator set over 0.018 in guidewire. Transitional dilator set exchanged for 6 French sheath over 0.035 in guidewire. Despite multiple attempts with single and double angle pigtail catheter, the pulmonary artery could not be accessed. The short 6 French sheath was exchanged for a 90 cm 7 [place] sheath. The tip of the sheath position just below the cavoatrial junction. The long 7 French sheath allowed for additional stability and the main pulmonary artery was accessed with a 6 [place] double angle pigtail catheter. Pre thrombectomy pulmonary artery pressure: 54/31 mmHg (mean 41 mmHg) Double angled pigtail catheter removed over exchange length Amplatz guidewire. Serial dilation was performed and 7 [place] sheath exchanged for [employer] 24 French sheath. Utilizing a Bern catheter, the guidewire was directed into a right lower lobe pulmonary artery branch. [place] FlowTriver device was advanced over the wire into the lateral basilar segmental and Truncus Anterior branches of the right pulmonary arterial system. Mechanical aspiration thrombectomy was performed extirpating thrombus. Post thrombectomy pulmonary angiogram demonstrated significant reduction in overall clot burden in the right pulmonary arterial branches. The left lateral basilar segmental branch of the lower lobe artery was selected with the burn catheter. The 20 [place] FlowTriver device was advanced over the wire into the lateral basilar segmental branches of the left pulmonary arterial system. No significant thrombus could be removed with the 20 [place] device. The 20 French device was removed and the 24 [place] FlowTriver device was inserted. The tip was advanced into the main left pulmonary artery. The T20 FlowTriver device was then inserted through the 24 French device into the lateral basilar segment, and mechanical aspiration thrombectomy was performed extirpating thrombus. Post aspiration pulmonary angiogram demonstrated  significant reduction in overall clot burden. Prior thrombectomy, the patient's blood pressure measured 87/67 mm Hg with a heart rate of 107 beats per minute. Post thrombectomy, pressures improved to 117/64 mm Hg with heart rate of 81 beats per minute. Patient's oxygenation improved from 93% on 15 L by non-rebreather 200% on 10 L by non-rebreather. FlowTriever device was removed. Bern catheter was advanced through the cord dry seal to the infrarenal inferior vena cava and venogram was performed in order to delineate the origin of the renal veins. Bern catheter was removed over a guidewire and Denali IVC filter was deployed in the infrarenal vena cava. The [place] Dry Seal sheath was removed and hemostasis achieved with purse-string suture and manual compression. IMPRESSION: 1. Bilateral pulmonary angiogram and Inari sunction thrombectomy as above. 2. Denali IVC filter placed. Electronically Signed   By: Miachel Roux M.D.   On: 12/15/2020 18:38   IR THROMBECT PRIM MECH INIT (INCLU) MOD SED  Result Date: 12/15/2020 INDICATION: 60 year old woman with sub massive pulmonary artery emboli with RV LV ratio of 3.4. Patient has a history of GBM and cannot receive tPA. Prior to the procedure patient was on non-rebreather at 15 L, with oxygen saturation of 93%. Interventional radiology consulted for catheter directed thrombectomy of pulmonary embolism. IVC filter also requested given history of lower extremity DVT and GBM. EXAM: 1. Ultrasound-guided access of right common femoral vein 2. Bilateral pulmonary venogram 3. Bilateral pulmonary suction thrombectomy 4. IVC filter placement COMPARISON:  CT angiography chest 12/14/2020 MEDICATIONS: None. ANESTHESIA/SEDATION: None. The patient was monitored by the anesthesia team throughout the procedure given their tenuous cardiovascular status. FLUOROSCOPY TIME:  Fluoroscopy Time: 53 minutes 42 seconds (132 mGy). COMPLICATIONS: None immediate. TECHNIQUE: Informed written consent was  obtained from the patient after a thorough discussion of the procedural risks, benefits and alternatives. All questions were addressed. Maximal Sterile Barrier Technique was utilized including caps, mask, sterile gowns, sterile gloves, sterile drape, hand hygiene and skin antiseptic. A timeout was performed prior to the initiation of the procedure. Right groin skin prepped and draped in usual fashion. The right common femoral vein was evaluated with ultrasound and shown to be patent. A permanent ultrasound image was obtained and placed in the patient's medical record. Using sterile gel and a sterile probe cover the right common femoral vein was entered with a 21 gauge needle during real time ultrasound guidance. 21 gauge needle exchanged for transitional dilator set over 0.018 in guidewire. Transitional dilator set exchanged for 6 French sheath over 0.035 in guidewire. Despite multiple attempts with single and double angle pigtail catheter, the pulmonary artery  could not be accessed. The short 6 French sheath was exchanged for a 90 cm 7 [place] sheath. The tip of the sheath position just below the cavoatrial junction. The long 7 French sheath allowed for additional stability and the main pulmonary artery was accessed with a 6 [place] double angle pigtail catheter. Pre thrombectomy pulmonary artery pressure: 54/31 mmHg (mean 41 mmHg) Double angled pigtail catheter removed over exchange length Amplatz guidewire. Serial dilation was performed and 7 [place] sheath exchanged for [employer] 24 French sheath. Utilizing a Bern catheter, the guidewire was directed into a right lower lobe pulmonary artery branch. 9 [place] FlowTriver device was advanced over the wire into the lateral basilar segmental and Truncus Anterior branches of the right pulmonary arterial system. Mechanical aspiration thrombectomy was performed extirpating thrombus. Post thrombectomy pulmonary angiogram demonstrated significant reduction in  overall clot burden in the right pulmonary arterial branches. The left lateral basilar segmental branch of the lower lobe artery was selected with the burn catheter. The 20 [place] FlowTriver device was advanced over the wire into the lateral basilar segmental branches of the left pulmonary arterial system. No significant thrombus could be removed with the 20 [place] device. The 20 French device was removed and the 24 [place] FlowTriver device was inserted. The tip was advanced into the main left pulmonary artery. The T20 FlowTriver device was then inserted through the 24 French device into the lateral basilar segment, and mechanical aspiration thrombectomy was performed extirpating thrombus. Post aspiration pulmonary angiogram demonstrated significant reduction in overall clot burden. Prior thrombectomy, the patient's blood pressure measured 87/67 mm Hg with a heart rate of 107 beats per minute. Post thrombectomy, pressures improved to 117/64 mm Hg with heart rate of 81 beats per minute. Patient's oxygenation improved from 93% on 15 L by non-rebreather 200% on 10 L by non-rebreather. FlowTriever device was removed. Bern catheter was advanced through the cord dry seal to the infrarenal inferior vena cava and venogram was performed in order to delineate the origin of the renal veins. Bern catheter was removed over a guidewire and Denali IVC filter was deployed in the infrarenal vena cava. The [place] Dry Seal sheath was removed and hemostasis achieved with purse-string suture and manual compression. IMPRESSION: 1. Bilateral pulmonary angiogram and Inari sunction thrombectomy as above. 2. Denali IVC filter placed. Electronically Signed   By: Miachel Roux M.D.   On: 12/15/2020 18:38   IR US Guide Vasc Access Right  Result Date: 12/15/2020 INDICATION: 60 year old woman with sub massive pulmonary artery emboli with RV LV ratio of 3.4. Patient has a history of GBM and cannot receive tPA. Prior to the procedure patient was  on non-rebreather at 15 L, with oxygen saturation of 93%. Interventional radiology consulted for catheter directed thrombectomy of pulmonary embolism. IVC filter also requested given history of lower extremity DVT and GBM. EXAM: 1. Ultrasound-guided access of right common femoral vein 2. Bilateral pulmonary venogram 3. Bilateral pulmonary suction thrombectomy 4. IVC filter placement COMPARISON:  CT angiography chest 12/14/2020 MEDICATIONS: None. ANESTHESIA/SEDATION: None. The patient was monitored by the anesthesia team throughout the procedure given their tenuous cardiovascular status. FLUOROSCOPY TIME:  Fluoroscopy Time: 53 minutes 42 seconds (132 mGy). COMPLICATIONS: None immediate. TECHNIQUE: Informed written consent was obtained from the patient after a thorough discussion of the procedural risks, benefits and alternatives. All questions were addressed. Maximal Sterile Barrier Technique was utilized including caps, mask, sterile gowns, sterile gloves, sterile drape, hand hygiene and skin antiseptic. A timeout was performed prior  to the initiation of the procedure. Right groin skin prepped and draped in usual fashion. The right common femoral vein was evaluated with ultrasound and shown to be patent. A permanent ultrasound image was obtained and placed in the patient's medical record. Using sterile gel and a sterile probe cover the right common femoral vein was entered with a 21 gauge needle during real time ultrasound guidance. 21 gauge needle exchanged for transitional dilator set over 0.018 in guidewire. Transitional dilator set exchanged for 6 French sheath over 0.035 in guidewire. Despite multiple attempts with single and double angle pigtail catheter, the pulmonary artery could not be accessed. The short 6 French sheath was exchanged for a 90 cm 7 [place] sheath. The tip of the sheath position just below the cavoatrial junction. The long 7 French sheath allowed for additional stability and the main  pulmonary artery was accessed with a 6 [place] double angle pigtail catheter. Pre thrombectomy pulmonary artery pressure: 54/31 mmHg (mean 41 mmHg) Double angled pigtail catheter removed over exchange length Amplatz guidewire. Serial dilation was performed and 7 [place] sheath exchanged for [employer] 24 French sheath. Utilizing a Bern catheter, the guidewire was directed into a right lower lobe pulmonary artery branch. 44 [place] FlowTriver device was advanced over the wire into the lateral basilar segmental and Truncus Anterior branches of the right pulmonary arterial system. Mechanical aspiration thrombectomy was performed extirpating thrombus. Post thrombectomy pulmonary angiogram demonstrated significant reduction in overall clot burden in the right pulmonary arterial branches. The left lateral basilar segmental branch of the lower lobe artery was selected with the burn catheter. The 20 [place] FlowTriver device was advanced over the wire into the lateral basilar segmental branches of the left pulmonary arterial system. No significant thrombus could be removed with the 20 [place] device. The 20 French device was removed and the 24 [place] FlowTriver device was inserted. The tip was advanced into the main left pulmonary artery. The T20 FlowTriver device was then inserted through the 24 [place] device into the lateral basilar segment, and mechanical aspiration thrombectomy was performed extirpating thrombus. Post aspiration pulmonary angiogram demonstrated significant reduction in overall clot burden. Prior thrombectomy, the patient's blood pressure measured 87/67 mm Hg with a heart rate of 107 beats per minute. Post thrombectomy, pressures improved to 117/64 mm Hg with heart rate of 81 beats per minute. Patient's oxygenation improved from 93% on 15 L by non-rebreather 200% on 10 L by non-rebreather. FlowTriever device was removed. Bern catheter was advanced through the cord dry seal to the infrarenal inferior  vena cava and venogram was performed in order to delineate the origin of the renal veins. Bern catheter was removed over a guidewire and Denali IVC filter was deployed in the infrarenal vena cava. The [place] Dry Seal sheath was removed and hemostasis achieved with purse-string suture and manual compression. IMPRESSION: 1. Bilateral pulmonary angiogram and Inari sunction thrombectomy as above. 2. Denali IVC filter placed. Electronically Signed   By: Miachel Roux M.D.   On: 12/15/2020 18:38   ECHOCARDIOGRAM COMPLETE  Result Date: 12/16/2020    ECHOCARDIOGRAM REPORT   Patient Name:   Hannah [name] Lardner Date of Exam: 12/16/2020 Medical Rec #:  [id number]        Height:       64.0 in Accession #:    [id number]       Weight:       132.7 lb Date of Birth:  [birth year]        BSA:  1.643 m Patient Age:    67 years         BP:           112/78 mmHg Patient Gender: F                HR:           76 bpm. Exam Location:  Inpatient Procedure: 2D Echo Indications:    congestive heart failure  History:        Patient has no prior history of Echocardiogram examinations.                 Pulmonary embolus; Signs/Symptoms:Altered Mental Status and                 Dyspnea.  Sonographer:    Johny Chess Referring Phys: [id number] SEONG-JOO JEONG IMPRESSIONS  1. Left ventricular ejection fraction, by estimation, is 60 to 65%. The left ventricle has normal function. The left ventricle has no regional wall motion abnormalities. Left ventricular diastolic parameters were normal.  2. Right ventricular systolic function is normal. The right ventricular size is mildly enlarged. There is normal pulmonary artery systolic pressure.  3. The mitral valve is normal in structure. Trivial mitral valve regurgitation.  4. The aortic valve is tricuspid. Aortic valve regurgitation is not visualized.  5. The inferior vena cava is normal in size with <50% respiratory variability, suggesting right atrial pressure of 8 mmHg. Comparison(s): No prior  Echocardiogram. FINDINGS  Left Ventricle: Left ventricular ejection fraction, by estimation, is 60 to 65%. The left ventricle has normal function. The left ventricle has no regional wall motion abnormalities. The left ventricular internal cavity size was normal in size. There is  no left ventricular hypertrophy. Left ventricular diastolic parameters were normal. Right Ventricle: The right ventricular size is mildly enlarged. No increase in right ventricular wall thickness. Right ventricular systolic function is normal. There is normal pulmonary artery systolic pressure. The tricuspid regurgitant velocity is 2.36  m/s, and with an assumed right atrial pressure of 3 mmHg, the estimated right ventricular systolic pressure is 99991111 mmHg. Left Atrium: Left atrial size was normal in size. Right Atrium: Right atrial size was normal in size. Pericardium: There is no evidence of pericardial effusion. Mitral Valve: The mitral valve is normal in structure. Trivial mitral valve regurgitation. Tricuspid Valve: The tricuspid valve is normal in structure. Tricuspid valve regurgitation is mild. Aortic Valve: The aortic valve is tricuspid. Aortic valve regurgitation is not visualized. Pulmonic Valve: The pulmonic valve was normal in structure. Pulmonic valve regurgitation is trivial. Aorta: The aortic root and ascending aorta are structurally normal, with no evidence of dilitation. Venous: The inferior vena cava is normal in size with less than 50% respiratory variability, suggesting right atrial pressure of 8 mmHg. IAS/Shunts: No atrial level shunt detected by color flow Doppler.  LEFT VENTRICLE PLAX 2D LVIDd:         3.70 cm  Diastology LVIDs:         2.40 cm  LV e' medial:    8.92 cm/s LV PW:         0.80 cm  LV E/e' medial:  8.6 LV IVS:        0.80 cm  LV e' lateral:   10.00 cm/s LVOT diam:     1.70 cm  LV E/e' lateral: 7.7 LV SV:         41 LV SV Index:   25 LVOT Area:     2.27 cm  RIGHT VENTRICLE             IVC RV S prime:      15.20 cm/s  IVC diam: 1.90 cm TAPSE (M-mode): 2.4 cm LEFT ATRIUM             Index       RIGHT ATRIUM           Index LA diam:        2.90 cm 1.76 cm/m  RA Area:     11.90 cm LA Vol (A2C):   37.8 ml 23.00 ml/m RA Volume:   24.80 ml  15.09 ml/m LA Vol (A4C):   24.8 ml 15.09 ml/m LA Biplane Vol: 31.5 ml 19.17 ml/m  AORTIC VALVE LVOT Vmax:   94.30 cm/s LVOT Vmean:  59.600 cm/s LVOT VTI:    0.179 m  AORTA Ao Root diam: 2.60 cm Ao Asc diam:  2.80 cm MITRAL VALVE               TRICUSPID VALVE MV Area (PHT): 3.48 cm    TR Peak grad:   22.3 mmHg MV Decel Time: 218 msec    TR Vmax:        236.00 cm/s MV E velocity: 76.70 cm/s MV A velocity: 61.30 cm/s  SHUNTS MV E/A ratio:  1.25        Systemic VTI:  0.18 m                            Systemic Diam: 1.70 cm Gwyndolyn Kaufman MD Electronically signed by Gwyndolyn Kaufman MD Signature Date/Time: 12/16/2020/2:06:17 PM    Final      Scheduled Meds: . (feeding supplement) PROSource Plus  30 mL Oral BID WC  . apixaban  10 mg Oral BID   Followed by  . [START ON 12/24/2020] apixaban  5 mg Oral BID  . Chlorhexidine Gluconate Cloth  6 each Topical Daily  . cholecalciferol  1,000 Units Oral Daily  . dexamethasone  8 mg Oral Daily  . levETIRAcetam  500 mg Oral BID  . mouth rinse  15 mL Mouth Rinse BID  . pantoprazole  40 mg Oral BID  . Ensure Max Protein  11 oz Oral BID  . temozolomide  100 mg Oral Daily  . temozolomide  20 mg Oral Daily   Continuous Infusions:  Principal Problem:   Acute hypoxemic respiratory failure (HCC) Active Problems:   Seizure disorder (HCC)   GBM (glioblastoma multiforme) (HCC)   Steroid-induced hyperglycemia   Acute massive pulmonary embolism ([place])   DNR (do not resuscitate)   Tachycardia     Xin Klawitter Derek Jack, [facility]  If 7PM-7AM, please contact night-coverage [email or web address] Password [email or web address] 12/17/2020, 3:03 PM    LOS: 2 days

## 2020-12-17 NOTE — Progress Notes (Signed)
ANTICOAGULATION CONSULT NOTE  Pharmacy Consult for Heparin Indication: pulmonary embolus  No Known Allergies  Patient Measurements: Height: 5\' 4"  (162.6 cm) Weight: 60.2 kg (132 lb 11.5 oz) IBW/kg (Calculated) : 54.7 Heparin Dosing Weight: 58 kg  Vital Signs: Temp: 97.6 F (36.4 C) (02/13 0400) Temp Source: Oral (02/13 0000) BP: 109/79 (02/13 0400) Pulse Rate: 52 (02/13 0400)  Labs: Recent Labs    12/14/20 2138 12/14/20 2145 12/15/20 0510 12/15/20 0740 12/15/20 1700 12/15/20 2220 12/16/20 0225 12/16/20 0515 12/16/20 1315 12/16/20 1358 12/17/20 0352 12/17/20 0813 12/17/20 1028  HGB 12.1 11.9* 12.2  --   --   --  9.2*  --  9.6*  --  9.0*  --   --   HCT 36.5 35.0* 37.0  --   --   --  27.6*  --  28.7*  --  27.0*  --   --   PLT 135*  --  145*  --   --   --  117*  --  125*  --  125*  --   --   APTT 35  --   --  >200*  --  37*  --   --   --   --   --   --   --   LABPROT 15.3*  --   --   --   --   --   --   --   --   --  14.8  --   --   INR 1.3*  --   --   --   --   --   --   --   --   --  1.2  --   --   HEPARINUNFRC  --   --   --  SUBOPTIMAL SPECIMEN   < >  --   --    < >  --  0.17* >2.20*  --  0.45  CREATININE 1.18* 1.00 0.60  --   --   --   --   --   --   --  0.81  --   --   TROPONINIHS 149*  --   --   --   --   --   --   --  149*  --   --  87*  --    < > = values in this interval not displayed.    Estimated Creatinine Clearance: 64.6 mL/min (by C-G formula based on SCr of 0.81 mg/dL).   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  Pharmacy is consulted to dose IV heparin for saddle PE and DVT.  Patient is s/p PE thrombectomy and IVC filter placement on 12/15/20.    Heparin level is sub-therapeutic at 0.17 units/mL.  RN reports possible short pauses due to IV site/line and she switched IV sites several hours prior to lab draw.  No bleeding reported.  Having some difficulty collecting heparin level. D/w both MDs from [facility]  and ICU. We will transition her to PO apixaban today.   Goal of Therapy:  Monitor platelets by anticoagulation protocol: Yes   Plan:  Dc heparin Apixaban 10mg  PO BID x7d then 5mg  BID Rx will follow peripherally  Onnie Boer, PharmD, BCIDP, AAHIVP, CPP Infectious Disease Pharmacist 12/17/2020 11:09 AM

## 2020-12-17 NOTE — Evaluation (Signed)
Physical Therapy Evaluation Patient Details Name: Hannah [name] MRN: [id number] DOB: [birth year] Today's Date: 12/17/2020   History of Present Illness  60 yo admitted 2/10 with AMS and DOE with near syncope climbing stairs with possible seizure. pt found to have large saddle PE. 2/11 PE thrombectomy and IVC filter placed. PMhx: left frontal glioblastoma multiforme undergoing chemo and radiation, craniotomy 02/16/20  Clinical Impression  Pt pleasant and eager to get OOB. Pt with SpO2 98% on RA with drop to 86% with stand pivot transfers and quick recovery with seated rest. Pt with slow processing and decreased memory who benefits from multimodal cues and increased time to process. Pt with decreased strength, transfers and activity tolerance who will benefit from acute therapy to maximize mobility, safety and function to decrease burden of care.   BP 116/70 (82 HR 73-80 SpO2 86-98% on RA    Follow Up Recommendations Home health PT    Equipment Recommendations  None recommended by PT    Recommendations for Other Services OT consult     Precautions / Restrictions Precautions Precautions: Fall Precaution Comments: watch sats      Mobility  Bed Mobility Overal bed mobility: Needs Assistance Bed Mobility: Supine to Sit     Supine to sit: HOB elevated;Mod assist     General bed mobility comments: min assist to bring legs to EOB with pt able to elevate trunk, mod assist to fully scoot to EOB once sitting. HOB 30 degrees    Transfers Overall transfer level: Needs assistance   Transfers: Sit to/from Stand;Stand Pivot Transfers Sit to Stand: Min assist Stand pivot transfers: Min assist       General transfer comment: cues for hand placement with pt able to pivot from bed>recliner<>BSC with use of bil UE support and cues for sequence. Stood additional time from recliner with RW present with good stability with use of DME.  Ambulation/Gait             General Gait  Details: unable due to bladder pain and desaturation to 86% in standing on RA  Stairs            Wheelchair Mobility    Modified Rankin (Stroke Patients Only)       Balance Overall balance assessment: Needs assistance Sitting-balance support: Feet supported Sitting balance-Leahy Scale: Fair Sitting balance - Comments: EOB with and without UE support   Standing balance support: Bilateral upper extremity supported Standing balance-Leahy Scale: Poor Standing balance comment: reliant on bil UE support in standing                             Pertinent Vitals/Pain Pain Assessment: 0-10 Pain Score: 4  Pain Location: pt reports bladder cramping Pain Descriptors / Indicators: Stabbing Pain Intervention(s): Limited activity within patient's tolerance;Monitored during session;Repositioned    Home Living Family/patient expects to be discharged to:: Private residence Living Arrangements: Spouse/significant other;Children Available Help at Discharge: Family;Available 24 hours/day Type of Home: House Home Access: Stairs to enter Entrance Stairs-Rails: None Entrance Stairs-Number of Steps: 2 Home Layout: Two level;Bed/bath upstairs Home Equipment: Bedside commode;Walker - 2 wheels;Wheelchair - manual;Shower seat      Prior Function Level of Independence: Needs assistance   Gait / Transfers Assistance Needed: has HHA for gait  ADL's / Homemaking Assistance Needed: sitting to bathe and dress with spouse assist, famiy doing to homemaking        Hand Dominance  Extremity/Trunk Assessment   Upper Extremity Assessment Upper Extremity Assessment: Generalized weakness    Lower Extremity Assessment Lower Extremity Assessment: Generalized weakness    Cervical / Trunk Assessment Cervical / Trunk Assessment: Normal  Communication   Communication: No difficulties  Cognition Arousal/Alertness: Awake/alert Behavior During Therapy: Flat affect Overall  Cognitive Status: Impaired/Different from baseline Area of Impairment: Memory;Safety/judgement;Problem solving                     Memory: Decreased short-term memory   Safety/Judgement: Decreased awareness of deficits   Problem Solving: Slow processing;Difficulty sequencing;Requires verbal cues;Requires tactile cues        General Comments      Exercises     Assessment/Plan    PT Assessment Patient needs continued PT services  PT Problem List Decreased strength;Decreased mobility;Decreased safety awareness;Decreased activity tolerance;Decreased cognition;Cardiopulmonary status limiting activity;Decreased balance;Decreased knowledge of use of DME       PT Treatment Interventions Gait training;Balance training;Therapeutic activities;Functional mobility training;Stair training;Cognitive remediation;Patient/family education;Neuromuscular re-education;DME instruction;Therapeutic exercise    PT Goals (Current goals can be found in the Care Plan section)  Acute Rehab PT Goals Patient Stated Goal: be able to walk and return home PT Goal Formulation: With patient Time For Goal Achievement: 12/31/20 Potential to Achieve Goals: Fair    Frequency Min 3X/week   Barriers to discharge        Co-evaluation               AM-PAC PT "6 Clicks" Mobility  Outcome Measure Help needed turning from your back to your side while in a flat bed without using bedrails?: A Little Help needed moving from lying on your back to sitting on the side of a flat bed without using bedrails?: A Lot Help needed moving to and from a bed to a chair (including a wheelchair)?: A Little Help needed standing up from a chair using your arms (e.g., wheelchair or bedside chair)?: A Little Help needed to walk in hospital room?: A Lot Help needed climbing 3-5 steps with a railing? : Total 6 Click Score: 14    End of Session Equipment Utilized During Treatment: Gait belt Activity Tolerance: Patient  tolerated treatment well Patient left: in chair;with call bell/phone within reach;with nursing/sitter in room;with family/visitor present (chair alarm pad but no box in room and RN aware) Nurse Communication: Mobility status;Precautions PT Visit Diagnosis: Other abnormalities of gait and mobility (R26.89);Difficulty in walking, not elsewhere classified (R26.2);Muscle weakness (generalized) (M62.81)    Time: 5883-2549 PT Time Calculation (min) (ACUTE ONLY): 36 min   Charges:   PT Evaluation $PT Eval Moderate Complexity: 1 Mod PT Treatments $Therapeutic Activity: 8-22 mins        Destani Wamser P, PT Acute Rehabilitation Services Pager: [phone] Office: [phone]   Analie Katzman B Victoriya Pol 12/17/2020, 2:15 PM

## 2020-12-18 ENCOUNTER — Encounter (HOSPITAL_COMMUNITY): Payer: Self-pay | Admitting: Radiology

## 2020-12-18 ENCOUNTER — Other Ambulatory Visit (HOSPITAL_COMMUNITY): Payer: Self-pay | Admitting: Family Medicine

## 2020-12-18 ENCOUNTER — Other Ambulatory Visit: Payer: Self-pay | Admitting: Family Medicine

## 2020-12-18 DIAGNOSIS — J9601 Acute respiratory failure with hypoxia: Secondary | ICD-10-CM | POA: Diagnosis not present

## 2020-12-18 LAB — CBC
HCT: 29.9 % — ABNORMAL LOW (ref 36.0–46.0)
Hemoglobin: 9.8 g/dL — ABNORMAL LOW (ref 12.0–15.0)
MCH: 32.5 pg (ref 26.0–34.0)
MCHC: 32.8 g/dL (ref 30.0–36.0)
MCV: 99 fL (ref 80.0–100.0)
Platelets: 150 10*3/uL (ref 150–400)
RBC: 3.02 MIL/uL — ABNORMAL LOW (ref 3.87–5.11)
RDW: 11.9 % (ref 11.5–15.5)
WBC: 5.6 10*3/uL (ref 4.0–10.5)
nRBC: 0.7 % — ABNORMAL HIGH (ref 0.0–0.2)

## 2020-12-18 MED ORDER — APIXABAN 5 MG PO TABS: 5.0000 mg | ORAL_TABLET | Freq: Two times a day (BID) | ORAL | 3 refills | Status: DC

## 2020-12-18 MED ORDER — APIXABAN 5 MG PO TABS: 10.0000 mg | ORAL_TABLET | Freq: Two times a day (BID) | ORAL | 0 refills | Status: DC

## 2020-12-18 MED ORDER — DOCUSATE SODIUM 100 MG PO CAPS
100.0000 mg | ORAL_CAPSULE | Freq: Every day | ORAL | Status: DC
  Administered 2020-12-18: 100 mg via ORAL
  Filled 2020-12-18 (×2): qty 1

## 2020-12-18 MED ORDER — POLYETHYLENE GLYCOL 3350 17 G PO PACK: 17.0000 g | PACK | Freq: Every day | ORAL | Status: DC

## 2020-12-18 MED ORDER — SENNA 8.6 MG PO TABS: 1.0000 | ORAL_TABLET | Freq: Every evening | ORAL | Status: DC | PRN

## 2020-12-18 MED ORDER — APIXABAN 5 MG PO TABS: ORAL_TABLET | ORAL | 0 refills | Status: DC

## 2020-12-18 MED ORDER — DEXAMETHASONE 1 MG PO TABS: 8.0000 mg | ORAL_TABLET | Freq: Every day | ORAL | Status: DC

## 2020-12-18 MED FILL — ELIQUIS 5 MG TABLET: 5 | 30 days supply | Qty: 70 | Fill #0

## 2020-12-18 NOTE — Consult Note (Signed)
   [facility] CM Inpatient Consult   12/18/2020  Caroleann Casler Pro 01-06-1961 [id number]    [facility] [ACO] Patient:  [facility] plan  Patient is currently assigned with [facility] for chronic disease management follow up as support from [place].  Plan:  Patient will receive a post hospital call and will be evaluated for assessments and support care needs as  followed by [place] Coordinator.  Made inpatient [facility] RNCM aware that [facility] is following. For changes or needs  please contact.  For additional questions or referrals please contact:   Natividad Brood, RN BSN [facility] Liaison  [phone] business mobile phone Toll free office [phone]  Fax number: [phone] [email or web address]

## 2020-12-18 NOTE — Progress Notes (Signed)
Patient's saturation was 96 post ambulation around the unit on room air

## 2020-12-18 NOTE — TOC Initial Note (Signed)
Transition of Care [facility]) - Initial/Assessment Note    Patient Details  Name: Hannah [name] MRN: [id number] Date of Birth: [birth year]  Transition [facility]) CM/SW Contact:    Bartholomew Crews, RN Phone Number: [phone] 12/18/2020, 11:06 AM  Clinical Narrative:                  Spoke with patient and spouse at the bedside. PTA home with spouse. Daughter is with her during the day, spouse is with her at night. Discussed HH PT needs per therapy recommendations. Politely declines at this time stating she has had it before and they told her do what she was already doing. Advised if she wants something outpatient that MD can provide referral. Discussed potential need for home oxygen d/t drop to 87% on RA while working with PT. Will follow for ambulatory saturation note and will make arrangements as needed. TOC following for transition needs.   Expected Discharge Plan: Home/Self Care Barriers to Discharge: Continued Medical Work up   Patient Goals and CMS Choice Patient states their goals for this hospitalization and ongoing recovery are:: return home CMS Medicare.gov Compare Post Acute Care list provided to:: Patient Choice offered to / list presented to : Patient  Expected Discharge Plan and Services Expected Discharge Plan: Home/Self Care In-house Referral: Hospice / Palliative Care Discharge Planning Services: CM Consult Post Acute Care Choice: [place] arrangements for the past 2 months: Single Family Home Expected Discharge Date: 12/18/20               DME Arranged: N/A DME Agency: NA       HH Arranged: Refused [place] Agency: NA        Prior Living Arrangements/Services Living arrangements for the past 2 months: Single Family Home Lives with:: Self,Spouse Patient language and need for interpreter reviewed:: Yes Do you feel safe going back to the place where you live?: Yes      Need for Family Participation in Patient Care: Yes (Comment) Care giver support  system in place?: Yes (comment)   Criminal Activity/Legal Involvement Pertinent to Current Situation/Hospitalization: No - Comment as needed  Activities of Daily Living Home Assistive Devices/Equipment: Contact lenses ADL Screening (condition at time of admission) Patient's cognitive ability adequate to safely complete daily activities?: No Is the patient deaf or have difficulty hearing?: No Does the patient have difficulty seeing, even when wearing glasses/contacts?: No Does the patient have difficulty concentrating, remembering, or making decisions?: Yes Patient able to express need for assistance with ADLs?: Yes Does the patient have difficulty dressing or bathing?: Yes Independently performs ADLs?: No Communication: Independent Dressing (OT): Needs assistance Is this a change from baseline?: Pre-admission baseline Grooming: Needs assistance Is this a change from baseline?: Pre-admission baseline Feeding: Needs assistance Is this a change from baseline?: Pre-admission baseline Bathing: Needs assistance Is this a change from baseline?: Pre-admission baseline Toileting: Needs assistance Is this a change from baseline?: Pre-admission baseline In/Out Bed: Needs assistance Is this a change from baseline?: Pre-admission baseline Walks in Home: Needs assistance Is this a change from baseline?: Pre-admission baseline Does the patient have difficulty walking or climbing stairs?: Yes (secondary to weakness) Weakness of Legs: Both (R>L) Weakness of Arms/Hands: Both (R>L)  Permission Sought/Granted Permission sought to share information with : Family Supports Permission granted to share information with : Yes, Verbal Permission Granted  Share Information with NAME: Hannah [name]     Permission granted to share info w Relationship: spouse  Permission  granted to share info w Contact Information: [phone]  Emotional Assessment Appearance:: Appears stated  age Attitude/Demeanor/Rapport: Engaged Affect (typically observed): Accepting Orientation: : Oriented to Self,Oriented to  Time,Oriented to Place,Oriented to Situation Alcohol / Substance Use: Not Applicable Psych Involvement: No (comment)  Admission diagnosis:  Acute hypoxemic respiratory failure ([place]) [J96.01] Acute saddle pulmonary embolism with acute cor pulmonale ([place]) [I26.02] Patient Active Problem List   Diagnosis Date Noted  . Acute massive pulmonary embolism ([place]) 12/15/2020  . Acute hypoxemic respiratory failure ([place]) 12/15/2020  . DNR (do not resuscitate) 12/15/2020  . Tachycardia 12/15/2020  . 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 GBM (glioblastoma multiforme) ([place])   . Steroid-induced hyperglycemia   . Seizures ([place])   . Leucocytosis   . Bradycardia   . Status post craniotomy 02/16/2020  . Tick bite 02/08/2020  . History of recent hospitalization 12/21/2019  . Seizure disorder ([place]) 12/21/2019  . Hypokalemia 12/21/2019  . Frontal mass of brain 12/16/2019  . Family history of colon cancer - father 35 05/26/2015  . Vitamin D deficiency 03/10/2015   PCP:  [name], DO Pharmacy:   [place] [place] [address] Phone: [phone] Fax: [phone]  [id number] [address] [place], [place] [address] Phone: [phone] Fax: [phone]  [place], [place] - [address] [place] [address] Phone: [phone] Fax: [phone]  [facility] [place], [place] - [address] [place] [address] Phone: [phone] Fax: [phone]     Social Determinants of Health (SDOH) Interventions    Readmission Risk Interventions No flowsheet data found.

## 2020-12-18 NOTE — Progress Notes (Signed)
[facility] PROGRESS NOTE    [name]  GGE:[id number] DOB: [birth year] DOA: 12/14/2020 PCP: [name], DO      Brief Narrative:  [name] is a 59 y.o. F with GBM on Temodar who presented with SOB.  In the ER, found to have a pulmonary embolism.       Assessment & Plan:  Acute pulmonary embolism Hypoxic and dizzy with ambulation this morning -Continue Eliquis   Glioblastoma -Continue Keppra, dexamethasone -Resume Temodar at discharge  Transaminitis Mild, resolved  Anemia No clinical bleeding, stable today.          Disposition: Status is: Inpatient  Remains inpatient appropriate because:still actively symptomatic and hypoxic with exertion   Dispo: The patient is from: Home              Anticipated d/c is to: Home              Anticipated d/c date is: 1 day              Patient currently is not medically stable to d/c.   Difficult to place patient No       Level of care: Med-Surg       MDM: The below labs and imaging reports were reviewed and summarized above.  Medication management as above.   DVT prophylaxis:  apixaban (ELIQUIS) tablet 10 mg  apixaban (ELIQUIS) tablet 5 mg  Code Status: DNR Family Communication: Husband at the bedsdie             Subjective: Dizzy with ambulation, out of breath, but able to [facility] it to commode.  No chest pain.  No syncope.  No fever.  Pain in the right groin.   Objective: Vitals:   12/18/20 0803 12/18/20 0900 12/18/20 0946 12/18/20 1000  BP: 107/88 131/89 136/86 (!) 113/93  Pulse: (!) 54 66 79 72  Resp: 16 16  20   Temp:      TempSrc:      SpO2: 97% 94% 93% 99%  Weight:      Height:        Intake/Output Summary (Last 24 hours) at 12/18/2020 1020 Last data filed at 12/18/2020 1000 Gross per 24 hour  Intake 800 ml  Output 2215 ml  Net -1415 ml   Filed Weights   12/14/20 2037 12/16/20 0218  Weight: 58 kg 60.2 kg    Examination: General  appearance:  adult female, alert and in no acute distress.  HEENT: Anicteric, conjunctiva pink, lids and lashes normal. No nasal deformity, discharge, epistaxis.  Lips moist.   Cardiac: RRR, nl S1-S2, no murmurs appreciated.   Respiratory: Normal respiratory rate and rhythm.  CTAB without rales or wheezes. MSK: No deformities or effusions. Neuro: Awake and alert.  EOMI, moves all extremities. Speech fluent.    Psych: Sensorium intact and responding to questions, attention normal. Affect normal.  Judgment and insight appear normal.    Data Reviewed: I have personally reviewed following labs and imaging studies:  CBC: Recent Labs  Lab 12/14/20 1207 12/14/20 1207 12/14/20 2138 12/14/20 2145 12/15/20 0510 12/16/20 0225 12/16/20 1315 12/17/20 0352 12/18/20 0201  WBC 7.8   < > 11.4*  --  7.0 6.1 7.9 5.0 5.6  NEUTROABS 6.6  --  10.3*  --   --   --   --   --   --   HGB 13.7  --  12.1   < > 12.2 9.2* 9.6* 9.0* 9.8*  HCT 39.5  --  36.5   < > 37.0 27.6* 28.7* 27.0* 29.9*  MCV 94.7  --  99.7  --  99.7 97.5 97.6 98.5 99.0  PLT 137*   < > 135*  --  145* 117* 125* 125* 150   < > = values in this interval not displayed.   Basic Metabolic Panel: Recent Labs  Lab 12/14/20 1207 12/14/20 2138 12/14/20 2145 12/15/20 0510 12/17/20 0352  NA 141 136 135 143 140  K 4.0 4.8 4.8 3.8 3.6  CL 104 102 102 113* 106  CO2 26 20*  --  19* 25  GLUCOSE 118* 280* 268* 124* 94  BUN 13 21* 21* 18 15  CREATININE 0.87 1.18* 1.00 0.60 0.81  CALCIUM 9.4 8.3*  --  7.7* 8.3*  MG  --  2.2  --  1.9 2.0  PHOS  --   --   --   --  3.0   GFR: Estimated Creatinine Clearance: 64.6 mL/min (by C-G formula based on SCr of 0.81 mg/dL). Liver Function Tests: Recent Labs  Lab 12/14/20 1207 12/14/20 2138 12/17/20 0352  AST 13* 141* 27  ALT 9 92* 66*  ALKPHOS 62 81 56  BILITOT 0.5 0.9 0.3  PROT 6.8 5.9* 4.8*  ALBUMIN 3.5 3.0* 2.5*   No results for input(s): LIPASE, AMYLASE in the last 168 hours. No results for  input(s): AMMONIA in the last 168 hours. Coagulation Profile: Recent Labs  Lab 12/14/20 2138 12/17/20 0352  INR 1.3* 1.2   Cardiac Enzymes: No results for input(s): CKTOTAL, CKMB, CKMBINDEX, TROPONINI in the last 168 hours. BNP (last 3 results) No results for input(s): PROBNP in the last 8760 hours. HbA1C: No results for input(s): HGBA1C in the last 72 hours. CBG: Recent Labs  Lab 12/15/20 1938 12/15/20 2343 12/16/20 0317 12/16/20 0725 12/16/20 1158  GLUCAP 113* 92 92 86 78   Lipid Profile: No results for input(s): CHOL, HDL, LDLCALC, TRIG, CHOLHDL, LDLDIRECT in the last 72 hours. Thyroid Function Tests: No results for input(s): TSH, T4TOTAL, FREET4, T3FREE, THYROIDAB in the last 72 hours. Anemia Panel: No results for input(s): VITAMINB12, FOLATE, FERRITIN, TIBC, IRON, RETICCTPCT in the last 72 hours. Urine analysis:    Component Value Date/Time   COLORURINE STRAW (A) 09/07/2020 1525   APPEARANCEUR CLEAR 09/07/2020 1525   LABSPEC 1.004 (L) 09/07/2020 1525   PHURINE 6.0 09/07/2020 1525   GLUCOSEU NEGATIVE 09/07/2020 1525   GLUCOSEU NEGATIVE 09/03/2013 0902   HGBUR NEGATIVE 09/07/2020 1525   BILIRUBINUR NEGATIVE 09/07/2020 1525   BILIRUBINUR negative 04/07/2015 1032   KETONESUR NEGATIVE 09/07/2020 1525   PROTEINUR NEGATIVE 09/07/2020 1525   UROBILINOGEN 0.2 04/07/2015 1032   UROBILINOGEN 0.2 09/03/2013 0902   NITRITE NEGATIVE 09/07/2020 1525   LEUKOCYTESUR NEGATIVE 09/07/2020 1525   Sepsis Labs: @LABRCNTIP (procalcitonin:4,lacticacidven:4)  ) Recent Results (from the past 240 hour(s))  Resp Panel by RT-PCR (Flu A&B, Covid) Nasopharyngeal Swab     Status: None   Collection Time: 12/14/20 11:21 PM   Specimen: Nasopharyngeal Swab; Nasopharyngeal(NP) swabs in vial transport medium  Result Value Ref Range Status   SARS Coronavirus 2 by RT PCR NEGATIVE NEGATIVE Final    Comment: (NOTE) SARS-CoV-2 target nucleic acids are NOT DETECTED.  The SARS-CoV-2 RNA is  generally detectable in upper respiratory specimens during the acute phase of infection. The lowest concentration of SARS-CoV-2 viral copies this assay can detect is 138 copies/mL. A negative result does not preclude SARS-Cov-2 infection and should not be used as the sole basis for treatment  or other patient management decisions. A negative result may occur with  improper specimen collection/handling, submission of specimen other than nasopharyngeal swab, presence of viral mutation(s) within the areas targeted by this assay, and inadequate number of viral copies(<138 copies/mL). A negative result must be combined with clinical observations, patient history, and epidemiological information. The expected result is Negative.  Fact Sheet for Patients:  EntrepreneurPulse.com.au  Fact Sheet for Healthcare Providers:  IncredibleEmployment.be  This test is no t yet approved or cleared by the [place] FDA and  has been authorized for detection and/or diagnosis of SARS-CoV-2 by FDA under an Emergency Use Authorization (EUA). This EUA will remain  in effect (meaning this test can be used) for the duration of the COVID-19 declaration under Section 564(b)(1) of the Act, 21 U.S.C.section 360bbb-3(b)(1), unless the authorization is terminated  or revoked sooner.       Influenza A by PCR NEGATIVE NEGATIVE Final   Influenza B by PCR NEGATIVE NEGATIVE Final    Comment: (NOTE) The Xpert Xpress SARS-CoV-2/FLU/RSV plus assay is intended as an aid in the diagnosis of influenza from Nasopharyngeal swab specimens and should not be used as a sole basis for treatment. Nasal washings and aspirates are unacceptable for Xpert Xpress SARS-CoV-2/FLU/RSV testing.  Fact Sheet for Patients: EntrepreneurPulse.com.au  Fact Sheet for Healthcare Providers: IncredibleEmployment.be  This test is not yet approved or cleared by the [place] FDA and has been authorized for detection and/or diagnosis of SARS-CoV-2 by FDA under an Emergency Use Authorization (EUA). This EUA will remain in effect (meaning this test can be used) for the duration of the COVID-19 declaration under Section 564(b)(1) of the Act, 21 U.S.C. section 360bbb-3(b)(1), unless the authorization is terminated or revoked.  Performed at [facility], [place] [address], [place],  [address]          Radiology Studies: ECHOCARDIOGRAM COMPLETE  Result Date: 12/16/2020    ECHOCARDIOGRAM REPORT   Patient Name:   LONNETTA KNISKERN Regina Date of Exam: 12/16/2020 Medical Rec #:  [id number]        Height:       64.0 in Accession #:    [id number]       Weight:       132.7 lb Date of Birth:  1961-09-10        BSA:          1.643 m Patient Age:    89 years         BP:           112/78 mmHg Patient Gender: F                HR:           76 bpm. Exam Location:  Inpatient Procedure: 2D Echo Indications:    congestive heart failure  History:        Patient has no prior history of Echocardiogram examinations.                 Pulmonary embolus; Signs/Symptoms:Altered Mental Status and                 Dyspnea.  Sonographer:    Johny Chess Referring Phys: [id number] SEONG-JOO JEONG IMPRESSIONS  1. Left ventricular ejection fraction, by estimation, is 60 to 65%. The left ventricle has normal function. The left ventricle has no regional wall motion abnormalities. Left ventricular diastolic parameters were normal.  2. Right ventricular systolic function is normal. The right ventricular size is  mildly enlarged. There is normal pulmonary artery systolic pressure.  3. The mitral valve is normal in structure. Trivial mitral valve regurgitation.  4. The aortic valve is tricuspid. Aortic valve regurgitation is not visualized.  5. The inferior vena cava is normal in size with <50% respiratory variability, suggesting right atrial pressure of 8 mmHg. Comparison(s): No prior  Echocardiogram. FINDINGS  Left Ventricle: Left ventricular ejection fraction, by estimation, is 60 to 65%. The left ventricle has normal function. The left ventricle has no regional wall motion abnormalities. The left ventricular internal cavity size was normal in size. There is  no left ventricular hypertrophy. Left ventricular diastolic parameters were normal. Right Ventricle: The right ventricular size is mildly enlarged. No increase in right ventricular wall thickness. Right ventricular systolic function is normal. There is normal pulmonary artery systolic pressure. The tricuspid regurgitant velocity is 2.36  m/s, and with an assumed right atrial pressure of 3 mmHg, the estimated right ventricular systolic pressure is 40.9 mmHg. Left Atrium: Left atrial size was normal in size. Right Atrium: Right atrial size was normal in size. Pericardium: There is no evidence of pericardial effusion. Mitral Valve: The mitral valve is normal in structure. Trivial mitral valve regurgitation. Tricuspid Valve: The tricuspid valve is normal in structure. Tricuspid valve regurgitation is mild. Aortic Valve: The aortic valve is tricuspid. Aortic valve regurgitation is not visualized. Pulmonic Valve: The pulmonic valve was normal in structure. Pulmonic valve regurgitation is trivial. Aorta: The aortic root and ascending aorta are structurally normal, with no evidence of dilitation. Venous: The inferior vena cava is normal in size with less than 50% respiratory variability, suggesting right atrial pressure of 8 mmHg. IAS/Shunts: No atrial level shunt detected by color flow Doppler.  LEFT VENTRICLE PLAX 2D LVIDd:         3.70 cm  Diastology LVIDs:         2.40 cm  LV e' medial:    8.92 cm/s LV PW:         0.80 cm  LV E/e' medial:  8.6 LV IVS:        0.80 cm  LV e' lateral:   10.00 cm/s LVOT diam:     1.70 cm  LV E/e' lateral: 7.7 LV SV:         41 LV SV Index:   25 LVOT Area:     2.27 cm  RIGHT VENTRICLE             IVC RV S prime:      15.20 cm/s  IVC diam: 1.90 cm TAPSE (M-mode): 2.4 cm LEFT ATRIUM             Index       RIGHT ATRIUM           Index LA diam:        2.90 cm 1.76 cm/m  RA Area:     11.90 cm LA Vol (A2C):   37.8 ml 23.00 ml/m RA Volume:   24.80 ml  15.09 ml/m LA Vol (A4C):   24.8 ml 15.09 ml/m LA Biplane Vol: 31.5 ml 19.17 ml/m  AORTIC VALVE LVOT Vmax:   94.30 cm/s LVOT Vmean:  59.600 cm/s LVOT VTI:    0.179 m  AORTA Ao Root diam: 2.60 cm Ao Asc diam:  2.80 cm MITRAL VALVE               TRICUSPID VALVE MV Area (PHT): 3.48 cm    TR Peak grad:   22.3  mmHg MV Decel Time: 218 msec    TR Vmax:        236.00 cm/s MV E velocity: 76.70 cm/s MV A velocity: 61.30 cm/s  SHUNTS MV E/A ratio:  1.25        Systemic VTI:  0.18 m                            Systemic Diam: 1.70 cm Gwyndolyn Kaufman MD Electronically signed by Gwyndolyn Kaufman MD Signature Date/Time: 12/16/2020/2:06:17 PM    Final         Scheduled Meds: . (feeding supplement) PROSource Plus  30 mL Oral BID WC  . apixaban  10 mg Oral BID   Followed by  . [START ON 12/24/2020] apixaban  5 mg Oral BID  . Chlorhexidine Gluconate Cloth  6 each Topical Daily  . cholecalciferol  1,000 Units Oral Daily  . dexamethasone  8 mg Oral Daily  . levETIRAcetam  500 mg Oral BID  . mouth rinse  15 mL Mouth Rinse BID  . pantoprazole  40 mg Oral BID  . Ensure Max Protein  11 oz Oral BID  . temozolomide  100 mg Oral Daily  . temozolomide  20 mg Oral Daily   Continuous Infusions:   LOS: 3 days    Time spent: 25 minutes    Edwin Dada, MD [facility] 12/18/2020, 10:20 AM     Please page though [place] or Epic secure chat:  For [employer], Adult nurse

## 2020-12-18 NOTE — Discharge Instructions (Signed)
Information on my medicine - ELIQUIS (apixaban)  This medication education was reviewed with me or my healthcare representative as part of my discharge preparation.   Why was Eliquis prescribed for you? Eliquis was prescribed to treat blood clots that may have been found in the veins of your legs (deep vein thrombosis) or in your lungs (pulmonary embolism) and to reduce the risk of them occurring again.  What do You need to know about Eliquis ? The starting dose is 10 mg (two 5 mg tablets) taken TWICE daily for the FIRST SEVEN (7) DAYS, then on 12/24/2020  the dose is reduced to ONE 5 mg tablet taken TWICE daily.  Eliquis may be taken with or without food.   Try to take the dose about the same time in the morning and in the evening. If you have difficulty swallowing the tablet whole please discuss with your pharmacist how to take the medication safely.  Take Eliquis exactly as prescribed and DO NOT stop taking Eliquis without talking to the doctor who prescribed the medication.  Stopping may increase your risk of developing a new blood clot.  Refill your prescription before you run out.  After discharge, you should have regular check-up appointments with your healthcare provider that is prescribing your Eliquis.    What do you do if you miss a dose? If a dose of ELIQUIS is not taken at the scheduled time, take it as soon as possible on the same day and twice-daily administration should be resumed. The dose should not be doubled to make up for a missed dose.  Important Safety Information A possible side effect of Eliquis is bleeding. You should call your healthcare provider right away if you experience any of the following: ? Bleeding from an injury or your nose that does not stop. ? Unusual colored urine (red or dark brown) or unusual colored stools (red or black). ? Unusual bruising for unknown reasons. ? A serious fall or if you hit your head (even if there is no bleeding).  Some  medicines may interact with Eliquis and might increase your risk of bleeding or clotting while on Eliquis. To help avoid this, consult your healthcare provider or pharmacist prior to using any new prescription or non-prescription medications, including herbals, vitamins, non-steroidal anti-inflammatory drugs (NSAIDs) and supplements.  This website has more information on Eliquis (apixaban): http://www.eliquis.com/eliquis/home

## 2020-12-18 NOTE — Progress Notes (Signed)
Physical Therapy Treatment Patient Details Name: Hannah [name] MRN: [id number] DOB: 1961-04-29 Today's Date: 12/18/2020    History of Present Illness 60 yo admitted 2/10 with AMS and DOE with near syncope climbing stairs with possible seizure. pt found to have large saddle PE. 2/11 PE thrombectomy and IVC filter placed. 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 Pt able to walk 150' with min assist and sats 87-93% on RA.  Pt and spouse educated for appropriate assist for gait and need to monitor SpO2 at home. Pt reports 7/10 right groin pain with activity which decreased at rest. Pt educated for transfers and progressive mobility with assist. Pt continues to require assist for pericare at this time.   BP 136/86 (102) seated with report of dizziness SpO2 87-93% on RA with activity HR 91   Follow Up Recommendations  Home health PT     Equipment Recommendations  None recommended by PT    Recommendations for Other Services       Precautions / Restrictions Precautions Precautions: Fall Precaution Comments: watch sats, right weakness    Mobility  Bed Mobility Overal bed mobility: Needs Assistance Bed Mobility: Supine to Sit     Supine to sit: HOB elevated;Mod assist     General bed mobility comments: min assist to bring legs to EOB with pt able to elevate trunk, mod assist to fully scoot to EOB once sitting. HOB 30 degrees, mod cues for hand placement as pt with tendency to hold therapist arms    Transfers Overall transfer level: Needs assistance   Transfers: Sit to/from Stand;Stand Pivot Transfers Sit to Stand: Min guard Stand pivot transfers: Min assist       General transfer comment: cues for hand placement and to rise from bed, BSC and recliner. Min assist for balance and support to pivot from bed to  [facility]  Ambulation/Gait Ambulation/Gait assistance: Min assist Gait Distance (Feet): 150 Feet Assistive device: Rolling walker (2 wheeled);1 person hand held assist Gait Pattern/deviations: Step-through pattern;Decreased stride length   Gait velocity interpretation: <1.8 ft/sec, indicate of risk for recurrent falls General Gait Details: pt initiated gait with RW with constant veering to left due to right weakness. Transitioned to [place] with min assist and improved posture after 75'. Pt and spouse educated for how to guard and assist from side at home as they report daughter tends to walk backward in front of pt for assist   Stairs             Wheelchair Mobility    Modified Rankin (Stroke Patients Only)       Balance Overall balance assessment: Needs assistance   Sitting balance-Leahy Scale: Fair Sitting balance - Comments: EOB with and without UE support   Standing balance support: Single extremity supported;Bilateral upper extremity supported Standing balance-Leahy Scale: Poor Standing balance comment: reliant on UE support in standing                            Cognition Arousal/Alertness: Awake/alert Behavior During Therapy: Flat affect Overall Cognitive Status: History of cognitive impairments - at baseline Area of Impairment: Memory;Safety/judgement;Problem solving;Following commands                     Memory: Decreased short-term memory Following Commands: Follows one step commands consistently Safety/Judgement: Decreased  awareness of deficits   Problem Solving: Slow processing;Difficulty sequencing;Requires verbal cues;Requires tactile cues        Exercises General Exercises - Lower Extremity Hip Flexion/Marching: AROM;Right;Seated;10 reps    General Comments        Pertinent Vitals/Pain Pain Score: 7  Pain Location: right groin incision Pain Descriptors / Indicators: Stabbing Pain Intervention(s): Limited activity within  patient's tolerance;Monitored during session;Premedicated before session;Repositioned;Ice applied    Home Living                      Prior Function            PT Goals (current goals can now be found in the care plan section) Progress towards PT goals: Progressing toward goals    Frequency    Min 3X/week      PT Plan Current plan remains appropriate    Co-evaluation              AM-PAC PT "6 Clicks" Mobility   Outcome Measure  Help needed turning from your back to your side while in a flat bed without using bedrails?: A Little Help needed moving from lying on your back to sitting on the side of a flat bed without using bedrails?: A Little Help needed moving to and from a bed to a chair (including a wheelchair)?: A Little Help needed standing up from a chair using your arms (e.g., wheelchair or bedside chair)?: A Little Help needed to walk in hospital room?: A Little Help needed climbing 3-5 steps with a railing? : A Lot 6 Click Score: 17    End of Session Equipment Utilized During Treatment: Gait belt Activity Tolerance: Patient tolerated treatment well Patient left: in chair;with call bell/phone within reach;with family/visitor present Nurse Communication: Mobility status;Precautions PT Visit Diagnosis: Other abnormalities of gait and mobility (R26.89);Difficulty in walking, not elsewhere classified (R26.2);Muscle weakness (generalized) (M62.81)     Time: 0881-1031 PT Time Calculation (min) (ACUTE ONLY): 27 min  Charges:  $Gait Training: 8-22 mins $Therapeutic Activity: 8-22 mins                     Osama Coleson P, PT Acute Rehabilitation Services Pager: [phone] Office: [place] 12/18/2020, 9:51 AM

## 2020-12-18 NOTE — Evaluation (Signed)
Occupational Therapy Evaluation Patient Details Name: Hannah [name] MRN: [id number] DOB: May 02, 1961 Today's Date: 12/18/2020    History of Present Illness 60 yo admitted 2/10 with AMS and DOE with near syncope climbing stairs with possible seizure. pt found to have large saddle PE. 2/11 PE thrombectomy and IVC filter placed. PMhx: left frontal glioblastoma multiforme undergoing chemo and radiation, craniotomy 02/16/20   Clinical Impression   Pt PTA:Pt living with family and 24/7 assist. Pt reports having assist at home with ADL, iADL and mobility with hand held assist every time she got up. Pt currently, at baseline for ADL requiring minA overall for ADL.Pt takes increased time for physical assist and for sequencing through the task. Pt at baseline functioning requires assist with ADL and hand held assist for mobility. Pt set-upA for feeding and minA for hand held assist for mobility and transfers in room. Pt sitting at sink for grooming and standing at commode for toilet hygiene with supervisionA to minguardA. Pt reports that her family plans to take good care of her at home and she does not want home health therapies. OT signing off.      Follow Up Recommendations  No OT follow up;Supervision/Assistance - 24 hour    Equipment Recommendations  3 in 1 bedside commode    Recommendations for Other Services       Precautions / Restrictions Precautions Precautions: Fall Precaution Comments: watch sats, right weakness Restrictions Weight Bearing Restrictions: No      Mobility Bed Mobility Overal bed mobility: Needs Assistance Bed Mobility: Supine to Sit;Sit to Supine     Supine to sit: HOB elevated;Mod assist Sit to supine: Mod assist   General bed mobility comments: Pt using HOB elevated, rails and b/l hand held assist to come to sitting EOB. Pt modA for maneuvering BLEs back in bed.    Transfers Overall transfer level: Needs assistance   Transfers: Sit to/from Stand;Stand  Pivot Transfers Sit to Stand: Min guard Stand pivot transfers: Min assist       General transfer comment: Pt with good recall from previous session with PT for hand placement. Pt going slow and very cautious.    Balance Overall balance assessment: Needs assistance   Sitting balance-Leahy Scale: Fair Sitting balance - Comments: EOB with and without UE support   Standing balance support: Single extremity supported;Bilateral upper extremity supported Standing balance-Leahy Scale: Poor Standing balance comment: reliant on UE support in standing and walking                           ADL either performed or assessed with clinical judgement   ADL Overall ADL's : At baseline                                       General ADL Comments: Pt at baseline requires assist with ADL and hand held assist for mobility. Pt set-upA for feeding and minA for hand held assist for mobility and transfers in room. Pt sitting at sink for grooming and standing at commode for toilet hygiene with supervisionA to minguardA.     Vision Baseline Vision/History: Wears glasses Wears Glasses: At all times Patient Visual Report: No change from baseline Vision Assessment?: No apparent visual deficits;Yes Eye Alignment: Within Functional Limits Ocular Range of Motion: Within Functional Limits Alignment/Gaze Preference: Within Defined Limits Tracking/Visual Pursuits: Able to track stimulus in  all quads without difficulty     Perception     Praxis      Pertinent Vitals/Pain Pain Assessment: 0-10 Pain Score: 7  Pain Location: right groin incision Pain Descriptors / Indicators: Jabbing Pain Intervention(s): Limited activity within patient's tolerance;Monitored during session     Hand Dominance Right   Extremity/Trunk Assessment Upper Extremity Assessment Upper Extremity Assessment: Generalized weakness   Lower Extremity Assessment Lower Extremity Assessment: Generalized  weakness   Cervical / Trunk Assessment Cervical / Trunk Assessment: Normal   Communication Communication Communication: No difficulties   Cognition Arousal/Alertness: Awake/alert Behavior During Therapy: Flat affect Overall Cognitive Status: History of cognitive impairments - at baseline Area of Impairment: Memory;Safety/judgement;Problem solving;Following commands                     Memory: Decreased short-term memory Following Commands: Follows one step commands consistently Safety/Judgement: Decreased awareness of deficits   Problem Solving: Slow processing;Difficulty sequencing;Requires verbal cues;Requires tactile cues General Comments: Pt appeared to take time to sequence through bed mobility, transfers and brushing teeth. Pt looking to OTR for approval prior to performing a task. Pt performing ADL functional tasks with multimodal cues.   General Comments  pt reports that her pain in only in R groin and that she prefers hand held assist instead of RW as "it was hard to use tha walker earlier." VSS on RA.    Exercises General Exercises - Lower Extremity Hip Flexion/Marching: AROM;Right;Seated;10 reps   Shoulder Instructions      Home Living Family/patient expects to be discharged to:: Private residence Living Arrangements: Spouse/significant other;Children Available Help at Discharge: Family;Available 24 hours/day Type of Home: House Home Access: Stairs to enter [employer] of Steps: 2 Entrance Stairs-Rails: None Home Layout: Two level;Bed/bath upstairs Alternate Level Stairs-Number of Steps: flight Alternate Level Stairs-Rails: Right Bathroom Shower/Tub: Occupational psychologist: Standard     Home Equipment: Bedside commode;Walker - 2 wheels;Wheelchair - manual;Shower seat          Prior Functioning/Environment Level of Independence: Needs assistance  Gait / Transfers Assistance Needed: has HHA for gait ADL's / Homemaking  Assistance Needed: sitting to bathe and dress with spouse assist, famiy doing to homemaking            OT Problem List: Decreased strength;Decreased safety awareness;Decreased activity tolerance;Pain      OT Treatment/Interventions:      OT Goals(Current goals can be found in the care plan section) Acute Rehab OT Goals Patient Stated Goal: be able to walk and return home OT Goal Formulation: All assessment and education complete, DC therapy Time For Goal Achievement: 01/01/21 Potential to Achieve Goals: Good  OT Frequency:     Barriers to D/C:            Co-evaluation              AM-PAC OT "6 Clicks" Daily Activity     Outcome Measure Help from another person eating meals?: A Little Help from another person taking care of personal grooming?: A Little Help from another person toileting, which includes using toliet, bedpan, or urinal?: A Little Help from another person bathing (including washing, rinsing, drying)?: A Little Help from another person to put on and taking off regular upper body clothing?: A Little Help from another person to put on and taking off regular lower body clothing?: A Little 6 Click Score: 18   End of Session Equipment Utilized During Treatment: Gait belt Nurse Communication: Mobility  status  Activity Tolerance: Patient tolerated treatment well Patient left: in bed;with call bell/phone within reach  OT Visit Diagnosis: Unsteadiness on feet (R26.81);Muscle weakness (generalized) (M62.81);Pain                Time: 4076-8088 OT Time Calculation (min): 26 min Charges:  OT General Charges $OT Visit: 1 Visit OT Evaluation $OT Eval Moderate Complexity: 1 Mod OT Treatments $Self Care/Home Management : 8-22 mins  Jefferey Pica, OTR/L Acute Rehabilitation Services Pager: [phone] Office: [phone]   Vernestine Brodhead C 12/18/2020, 1:41 PM

## 2020-12-18 NOTE — Progress Notes (Addendum)
   B PE; thrombectomy 12/15/20  IMPRESSION: 1. Bilateral pulmonary angiogram and Inari sunction thrombectomy as above. 2. [place] IVC filter placed.  The Gore Dry Seal sheath was removed and hemostasis achieved with purse-string suture and manual compression  Rt femoral vein Purse string removed at bedside today per Dr Earleen Newport order  Tolerated well Dressing placed  Pt will hear from [facility] for appt for consultation for IVC filter removal

## 2020-12-19 ENCOUNTER — Encounter (HOSPITAL_COMMUNITY): Payer: Self-pay

## 2020-12-19 ENCOUNTER — Inpatient Hospital Stay (HOSPITAL_BASED_OUTPATIENT_CLINIC_OR_DEPARTMENT_OTHER): Payer: 59 | Admitting: Internal Medicine

## 2020-12-19 DIAGNOSIS — G40909 Epilepsy, unspecified, not intractable, without status epilepticus: Secondary | ICD-10-CM

## 2020-12-19 DIAGNOSIS — C719 Malignant neoplasm of brain, unspecified: Secondary | ICD-10-CM

## 2020-12-19 DIAGNOSIS — I2699 Other pulmonary embolism without acute cor pulmonale: Secondary | ICD-10-CM | POA: Diagnosis not present

## 2020-12-19 DIAGNOSIS — J9601 Acute respiratory failure with hypoxia: Secondary | ICD-10-CM | POA: Diagnosis not present

## 2020-12-19 LAB — CBC
HCT: 30.9 % — ABNORMAL LOW (ref 36.0–46.0)
Hemoglobin: 10.3 g/dL — ABNORMAL LOW (ref 12.0–15.0)
MCH: 32.2 pg (ref 26.0–34.0)
MCHC: 33.3 g/dL (ref 30.0–36.0)
MCV: 96.6 fL (ref 80.0–100.0)
Platelets: 170 10*3/uL (ref 150–400)
RBC: 3.2 MIL/uL — ABNORMAL LOW (ref 3.87–5.11)
RDW: 12.2 % (ref 11.5–15.5)
WBC: 6.9 10*3/uL (ref 4.0–10.5)
nRBC: 0.7 % — ABNORMAL HIGH (ref 0.0–0.2)

## 2020-12-19 NOTE — Discharge Summary (Signed)
Physician Discharge Summary  Hannah [name] XTG:[id number] DOB: January 29, 1961 DOA: 12/14/2020  PCP: [name], DO  Admit date: 12/14/2020 Discharge date: 12/19/2020  Admitted From: Home  Disposition:  Home with [name]   Recommendations for Outpatient Follow-up:  1. Follow up with Dr. Mickeal Skinner this week      Home Health: PT/OT due to SOB with exertion  Equipment/Devices: 3-in-1  Discharge Condition: Fair  CODE STATUS: DNR Diet recommendation: Regular  Brief/Interim Summary: Hannah [name] is a 60 year old F with GBM on Temodar 1 week pleuritic chest [name] shortness of breath with exertion, and near syncope.  In the ER, CTA showed a near occlusive pulmonary embolism with elevated RV to LV ratio.  She was admitted to the ICU due to hypotension.     PRINCIPAL HOSPITAL DIAGNOSIS: Acute massive pulmonary embolism    Discharge Diagnoses:  Acute pulmonary embolism with hypotension  Patient was admitted to the ICU, hypotensive to the 80s over 60s.  Started on heparin.  IR was consulted, the patient was not a candidate for systemic thrombolytics due to intracranial malignancy.  She was taken by IR for IVC filter and mechanical thrombectomy on 2/11, completed without complication.  Follow-up echocardiogram showed normal LV and RV function.  She was weaned off of oxygen, and able to ambulate without symptoms.  Transitioned to Eliquis, has close follow-up with hematology oncology.    Transaminitis This is mild, now resolved.  Anemia No clinical bleeding, her hemoglobin has been stable.  Glioblastoma Her Temodar was held in the hospital.  She was continued on Keppra and dexamethasone.        Discharge Instructions  Discharge Instructions    Diet general   Complete by: As directed    Discharge instructions   Complete by: As directed    From Dr. Loleta Books: You were admitted with a blood clot in the chest.  You were treated with heparin and had a thrombectomy and vein  filter (IVC filter, or "inferior vena cava" filter) placed.  You should take Eliquis/apixaban, the blood thinner.  Take apixaban/Eliquis 10 mg (two tabs) twice daily for 7 days Next Sunday, you may reduce to 5 mg (1 tab) twice daily  Take apixaban for AT LEAST 3 months, longer depending on your discussion with Dr. Mickeal Skinner  If the IVC filter/vein filter is removable, you may discuss with Dr. Mickeal Skinner when he thinks it should be taken out. You would need to be referred back to the [facility] department  Resume your medicines for GBM as directed by Dr. Mickeal Skinner (the Keppra, Decadron and most importantly Temodar).  Follow up with Dr. Mickeal Skinner tomorrow by phone   Increase activity slowly   Complete by: As directed    No wound care   Complete by: As directed      Allergies as of 12/19/2020   No Known Allergies     Medication List    TAKE these medications   apixaban 5 MG Tabs tablet Commonly known as: ELIQUIS Take apixaban 10 mg (2 tabs) twice daily for 20more days then take apixaban 5 mg (1 tab) twice daily   apixaban 5 MG Tabs tablet Commonly known as: ELIQUIS Take 1 tablet (5 mg total) by mouth 2 (two) times daily. Start taking on: December 24, 2020   cholecalciferol 25 MCG (1000 UNIT) tablet Commonly known as: VITAMIN D3 Take 1,000 Units by mouth daily.   cyclobenzaprine 10 MG tablet Commonly known as: FLEXERIL Take 1 tablet (10 mg total) by mouth  3 (three) times daily as needed for muscle spasms.   dexamethasone 1 MG tablet Commonly known as: DECADRON Take 8 tablets (8 mg total) by mouth daily.   docusate sodium 100 MG capsule Commonly known as: COLACE Take 100 mg by mouth 2 (two) times daily as needed for mild constipation.   HYDROcodone-acetaminophen 5-325 MG tablet Commonly known as: NORCO/VICODIN Take 1 tablet by mouth every 6 (six) hours as needed.   levETIRAcetam 500 MG tablet Commonly known as: KEPPRA Take 1 tablet (500 mg total) by  mouth 2 (two) times daily.   LORazepam 0.5 MG tablet Commonly known as: ATIVAN Place 1 tablet (0.5 mg total) under the tongue every 6 (six) hours as needed for anxiety.   ondansetron 8 MG tablet Commonly known as: Zofran Take 1 tablet (8 mg total) by mouth 2 (two) times daily as needed (nausea and vomiting). May take 30-60 minutes prior to Temodar administration if nausea/vomiting occurs.   temozolomide 20 MG capsule Commonly known as: TEMODAR Take 1 capsule (20 mg total) by mouth daily. May take on an empty stomach to decrease nausea & vomiting.   temozolomide 100 MG capsule Commonly known as: TEMODAR Take 1 capsule (100 mg total) by mouth daily. May take on an empty stomach to decrease nausea & vomiting.       Follow-up Information    Kuneff, Hannah A, DO. Schedule an appointment as soon as possible for a visit in 1 week(s).   Specialty: Family Medicine Contact information: [address] [place] [address] [phone]        [name], MD. Schedule an appointment as soon as possible for a visit in 1 week(s).   Specialties: Psychiatry, Neurology, Oncology Contact information: [place] [address] [phone]              No Known Allergies  Consultations:  Critical care  Interventional radiology   Procedures/Studies: DG Ribs Unilateral W/Chest Left  Result Date: 12/08/2020 CLINICAL DATA:  Left lower chest [name]. EXAM: LEFT RIBS AND CHEST - 3+ VIEW COMPARISON:  03/10/2013 FINDINGS: Mild left lower lobe atelectasis. Right lung clear. Negative for effusion or pneumothorax. Heart size and vascularity normal. Healed fracture left sixth rib.  No acute fracture or bone lesion. IMPRESSION: Mild left lower lobe atelectasis. Negative for acute rib abnormality on the left. Electronically Signed   By: Franchot Gallo M.D.   On: 12/08/2020 10:33   CT Head Wo Contrast  Result Date: 12/14/2020 CLINICAL DATA:  60 year old female with syncope,  seizure, and abnormal neurological exam. Known glioblastoma undergoing chemo and radiation treatment. EXAM: CT HEAD WITHOUT CONTRAST TECHNIQUE: Contiguous axial images were obtained from the base of the skull through the vertex without intravenous contrast. COMPARISON:  Brain MRI dated 11/02/2020. FINDINGS: Brain: Ill-defined 3.8 x 2.3 cm low attenuating area centered at anterior corpus callosum and crossing the midline correspond to the necrotic mass seen on the prior MRI. There is interval development of moderate amount of white matter edema predominantly involving the frontal lobes, increased since the prior MRI. There is approximately 4 mm right to left midline shift (measured at the level of the foramen Monro). Ill-defined area of higher attenuation involving the left frontal convexity (23/2) may represent volume averaging from adjacent cortices or additional mass. MRI without and with contrast is recommended for better evaluation of the mass and comparison with prior MRI images. There is no acute intracranial hemorrhage. Vascular: No hyperdense vessel or unexpected calcification. Skull: Left  frontal craniotomy.  No acute calvarial pathology. Sinuses/Orbits: No acute finding. Other: None IMPRESSION: 1. Increase in the degree of edema with 4 mm right to left midline shift. MRI without and with contrast may provide better evaluation. 2. No acute intracranial hemorrhage. These results were called by telephone at the time of interpretation on 12/14/2020 at 9:42 pm to provider Hannah [name] , who verbally acknowledged these results. Electronically Signed   By: Anner Crete M.D.   On: 12/14/2020 21:49   CT Angio Chest PE W/Cm &/Or Wo Cm  Result Date: 12/14/2020 CLINICAL DATA:  History of brain tumor shortness of breath EXAM: CT ANGIOGRAPHY CHEST WITH CONTRAST TECHNIQUE: Multidetector CT imaging of the chest was performed using the standard protocol during bolus administration of intravenous contrast.  Multiplanar CT image reconstructions and MIPs were obtained to evaluate the vascular anatomy. CONTRAST:  116mL OMNIPAQUE IOHEXOL 350 MG/ML SOLN COMPARISON:  None. FINDINGS: Cardiovascular: There is a optimal opacification of the pulmonary arteries. There is partially occlusive thrombus within the central central bifurcation of the main pulmonary arteries with nearly occlusive thrombus in the bilateral segmental and subsegmental arterial branches throughout both lungs. There is evidence of right ventricular heart strain, RV/LV ratio equals 2.1 there is a small pericardial effusion present. There is normal three-vessel brachiocephalic anatomy without proximal stenosis. Scattered mild aortic atherosclerosis is seen. Mediastinum/Nodes: No hilar, mediastinal, or axillary adenopathy. Thyroid gland, trachea, and esophagus demonstrate no significant findings. Lungs/Pleura: A small left pleural effusion with adjacent patchy airspace opacity at the left lung base, likely atelectasis is noted. Upper Abdomen: No acute abnormalities present in the visualized portions of the upper abdomen. Hree focus of contrast is seen within the IVC, likely consistent with right heart strain. Musculoskeletal: No chest wall abnormality. No acute or significant osseous findings. Review of the MIP images confirms the above findings. IMPRESSION: Partially occlusive saddle emboli with nearly occlusive thrombus within both lungs throughout the segmental and subsegmental arterial branches. CTevidence of right heart strain (RV/LV Ratio = 2.1) consistent with at least submassive (intermediate risk) PE. The presence of right heart strain has been associated with an increased risk of morbidity and mortality. Small pericardial effusion Small left pleural effusion with basilar atelectasis These results were called by telephone at the time of interpretation on 12/14/2020 at 9:42 pm to provider Hannah [name] , who verbally acknowledged these results.  Electronically Signed   By: Prudencio Pair M.D.   On: 12/14/2020 21:44   IR Angiogram Pulmonary Bilateral Selective  Result Date: 12/15/2020 INDICATION: 60 year old woman with sub massive pulmonary artery emboli with RV LV ratio of 3.4. Patient has a history of GBM and cannot receive tPA. Prior to the procedure patient was on non-rebreather at 15 L, with oxygen saturation of 93%. Interventional radiology consulted for catheter directed thrombectomy of pulmonary embolism. IVC filter also requested given history of lower extremity DVT and GBM. EXAM: 1. Ultrasound-guided access of right common femoral vein 2. Bilateral pulmonary venogram 3. Bilateral pulmonary suction thrombectomy 4. IVC filter placement COMPARISON:  CT angiography chest 12/14/2020 MEDICATIONS: None. ANESTHESIA/SEDATION: None. The patient was monitored by the anesthesia team throughout the procedure given their tenuous cardiovascular status. FLUOROSCOPY TIME:  Fluoroscopy Time: 53 minutes 42 seconds (132 mGy). COMPLICATIONS: None immediate. TECHNIQUE: Informed written consent was obtained from the patient after a thorough discussion of the procedural risks, benefits and alternatives. All questions were addressed. Maximal Sterile Barrier Technique was utilized including caps, mask, sterile gowns, sterile gloves, sterile drape, hand hygiene and skin  antiseptic. A timeout was performed prior to the initiation of the procedure. Right groin skin prepped and draped in usual fashion. The right common femoral vein was evaluated with ultrasound and shown to be patent. A permanent ultrasound image was obtained and placed in the patient's medical record. Using sterile gel and a sterile probe cover the right common femoral vein was entered with a 21 gauge needle during real time ultrasound guidance. 21 gauge needle exchanged for transitional dilator set over 0.018 in guidewire. Transitional dilator set exchanged for 6 French sheath over 0.035 in guidewire.  Despite multiple attempts with single and double angle pigtail catheter, the pulmonary artery could not be accessed. The short 6 French sheath was exchanged for a 90 cm 7 [place] sheath. The tip of the sheath position just below the cavoatrial junction. The long 7 French sheath allowed for additional stability and the main pulmonary artery was accessed with a 6 [place] double angle pigtail catheter. Pre thrombectomy pulmonary artery pressure: 54/31 mmHg (mean 41 mmHg) Double angled pigtail catheter removed over exchange length Amplatz guidewire. Serial dilation was performed and 7 [place] sheath exchanged for [employer] 24 French sheath. Utilizing a Bern catheter, the guidewire was directed into a right lower lobe pulmonary artery branch. 57 [place] FlowTriver device was advanced over the wire into the lateral basilar segmental and Truncus Anterior branches of the right pulmonary arterial system. Mechanical aspiration thrombectomy was performed extirpating thrombus. Post thrombectomy pulmonary angiogram demonstrated significant reduction in overall clot burden in the right pulmonary arterial branches. The left lateral basilar segmental branch of the lower lobe artery was selected with the burn catheter. The 20 [place] FlowTriver device was advanced over the wire into the lateral basilar segmental branches of the left pulmonary arterial system. No significant thrombus could be removed with the 20 [place] device. The 20 French device was removed and the 24 [place] FlowTriver device was inserted. The tip was advanced into the main left pulmonary artery. The T20 FlowTriver device was then inserted through the 24 French device into the lateral basilar segment, and mechanical aspiration thrombectomy was performed extirpating thrombus. Post aspiration pulmonary angiogram demonstrated significant reduction in overall clot burden. Prior thrombectomy, the patient's blood pressure measured 87/67 mm Hg with a heart rate of  107 beats per minute. Post thrombectomy, pressures improved to 117/64 mm Hg with heart rate of 81 beats per minute. Patient's oxygenation improved from 93% on 15 L by non-rebreather 200% on 10 L by non-rebreather. FlowTriever device was removed. Bern catheter was advanced through the cord dry seal to the infrarenal inferior vena cava and venogram was performed in order to delineate the origin of the renal veins. Bern catheter was removed over a guidewire and Denali IVC filter was deployed in the infrarenal vena cava. The [place] Dry Seal sheath was removed and hemostasis achieved with purse-string suture and manual compression. IMPRESSION: 1. Bilateral pulmonary angiogram and Inari sunction thrombectomy as above. 2. Denali IVC filter placed. Electronically Signed   By: Miachel Roux M.D.   On: 12/15/2020 18:38   IR Angiogram Selective Each Additional Vessel  Result Date: 12/15/2020 INDICATION: 60 year old woman with sub massive pulmonary artery emboli with RV LV ratio of 3.4. Patient has a history of GBM and cannot receive tPA. Prior to the procedure patient was on non-rebreather at 15 L, with oxygen saturation of 93%. Interventional radiology consulted for catheter directed thrombectomy of pulmonary embolism. IVC filter also requested given history of lower extremity DVT and GBM. EXAM:  1. Ultrasound-guided access of right common femoral vein 2. Bilateral pulmonary venogram 3. Bilateral pulmonary suction thrombectomy 4. IVC filter placement COMPARISON:  CT angiography chest 12/14/2020 MEDICATIONS: None. ANESTHESIA/SEDATION: None. The patient was monitored by the anesthesia team throughout the procedure given their tenuous cardiovascular status. FLUOROSCOPY TIME:  Fluoroscopy Time: 53 minutes 42 seconds (132 mGy). COMPLICATIONS: None immediate. TECHNIQUE: Informed written consent was obtained from the patient after a thorough discussion of the procedural risks, benefits and alternatives. All questions were addressed.  Maximal Sterile Barrier Technique was utilized including caps, mask, sterile gowns, sterile gloves, sterile drape, hand hygiene and skin antiseptic. A timeout was performed prior to the initiation of the procedure. Right groin skin prepped and draped in usual fashion. The right common femoral vein was evaluated with ultrasound and shown to be patent. A permanent ultrasound image was obtained and placed in the patient's medical record. Using sterile gel and a sterile probe cover the right common femoral vein was entered with a 21 gauge needle during real time ultrasound guidance. 21 gauge needle exchanged for transitional dilator set over 0.018 in guidewire. Transitional dilator set exchanged for 6 French sheath over 0.035 in guidewire. Despite multiple attempts with single and double angle pigtail catheter, the pulmonary artery could not be accessed. The short 6 French sheath was exchanged for a 90 cm 7 [place] sheath. The tip of the sheath position just below the cavoatrial junction. The long 7 French sheath allowed for additional stability and the main pulmonary artery was accessed with a 6 [place] double angle pigtail catheter. Pre thrombectomy pulmonary artery pressure: 54/31 mmHg (mean 41 mmHg) Double angled pigtail catheter removed over exchange length Amplatz guidewire. Serial dilation was performed and 7 [place] sheath exchanged for [employer] 24 French sheath. Utilizing a Bern catheter, the guidewire was directed into a right lower lobe pulmonary artery branch. 42 [place] FlowTriver device was advanced over the wire into the lateral basilar segmental and Truncus Anterior branches of the right pulmonary arterial system. Mechanical aspiration thrombectomy was performed extirpating thrombus. Post thrombectomy pulmonary angiogram demonstrated significant reduction in overall clot burden in the right pulmonary arterial branches. The left lateral basilar segmental branch of the lower lobe artery was selected  with the burn catheter. The 20 [place] FlowTriver device was advanced over the wire into the lateral basilar segmental branches of the left pulmonary arterial system. No significant thrombus could be removed with the 20 [place] device. The 20 French device was removed and the 24 [place] FlowTriver device was inserted. The tip was advanced into the main left pulmonary artery. The T20 FlowTriver device was then inserted through the 24 French device into the lateral basilar segment, and mechanical aspiration thrombectomy was performed extirpating thrombus. Post aspiration pulmonary angiogram demonstrated significant reduction in overall clot burden. Prior thrombectomy, the patient's blood pressure measured 87/67 mm Hg with a heart rate of 107 beats per minute. Post thrombectomy, pressures improved to 117/64 mm Hg with heart rate of 81 beats per minute. Patient's oxygenation improved from 93% on 15 L by non-rebreather 200% on 10 L by non-rebreather. FlowTriever device was removed. Bern catheter was advanced through the cord dry seal to the infrarenal inferior vena cava and venogram was performed in order to delineate the origin of the renal veins. Bern catheter was removed over a guidewire and Denali IVC filter was deployed in the infrarenal vena cava. The [place] Dry Seal sheath was removed and hemostasis achieved with purse-string suture and manual compression. IMPRESSION: 1.  Bilateral pulmonary angiogram and Inari sunction thrombectomy as above. 2. Denali IVC filter placed. Electronically Signed   By: Miachel Roux M.D.   On: 12/15/2020 18:38   IR Angiogram Selective Each Additional Vessel  Result Date: 12/15/2020 INDICATION: 60 year old woman with sub massive pulmonary artery emboli with RV LV ratio of 3.4. Patient has a history of GBM and cannot receive tPA. Prior to the procedure patient was on non-rebreather at 15 L, with oxygen saturation of 93%. Interventional radiology consulted for catheter directed thrombectomy  of pulmonary embolism. IVC filter also requested given history of lower extremity DVT and GBM. EXAM: 1. Ultrasound-guided access of right common femoral vein 2. Bilateral pulmonary venogram 3. Bilateral pulmonary suction thrombectomy 4. IVC filter placement COMPARISON:  CT angiography chest 12/14/2020 MEDICATIONS: None. ANESTHESIA/SEDATION: None. The patient was monitored by the anesthesia team throughout the procedure given their tenuous cardiovascular status. FLUOROSCOPY TIME:  Fluoroscopy Time: 53 minutes 42 seconds (132 mGy). COMPLICATIONS: None immediate. TECHNIQUE: Informed written consent was obtained from the patient after a thorough discussion of the procedural risks, benefits and alternatives. All questions were addressed. Maximal Sterile Barrier Technique was utilized including caps, mask, sterile gowns, sterile gloves, sterile drape, hand hygiene and skin antiseptic. A timeout was performed prior to the initiation of the procedure. Right groin skin prepped and draped in usual fashion. The right common femoral vein was evaluated with ultrasound and shown to be patent. A permanent ultrasound image was obtained and placed in the patient's medical record. Using sterile gel and a sterile probe cover the right common femoral vein was entered with a 21 gauge needle during real time ultrasound guidance. 21 gauge needle exchanged for transitional dilator set over 0.018 in guidewire. Transitional dilator set exchanged for 6 French sheath over 0.035 in guidewire. Despite multiple attempts with single and double angle pigtail catheter, the pulmonary artery could not be accessed. The short 6 French sheath was exchanged for a 90 cm 7 [place] sheath. The tip of the sheath position just below the cavoatrial junction. The long 7 French sheath allowed for additional stability and the main pulmonary artery was accessed with a 6 [place] double angle pigtail catheter. Pre thrombectomy pulmonary artery pressure: 54/31 mmHg  (mean 41 mmHg) Double angled pigtail catheter removed over exchange length Amplatz guidewire. Serial dilation was performed and 7 [place] sheath exchanged for [employer] 24 French sheath. Utilizing a Bern catheter, the guidewire was directed into a right lower lobe pulmonary artery branch. 48 [place] FlowTriver device was advanced over the wire into the lateral basilar segmental and Truncus Anterior branches of the right pulmonary arterial system. Mechanical aspiration thrombectomy was performed extirpating thrombus. Post thrombectomy pulmonary angiogram demonstrated significant reduction in overall clot burden in the right pulmonary arterial branches. The left lateral basilar segmental branch of the lower lobe artery was selected with the burn catheter. The 20 [place] FlowTriver device was advanced over the wire into the lateral basilar segmental branches of the left pulmonary arterial system. No significant thrombus could be removed with the 20 [place] device. The 20 French device was removed and the 24 [place] FlowTriver device was inserted. The tip was advanced into the main left pulmonary artery. The T20 FlowTriver device was then inserted through the 24 French device into the lateral basilar segment, and mechanical aspiration thrombectomy was performed extirpating thrombus. Post aspiration pulmonary angiogram demonstrated significant reduction in overall clot burden. Prior thrombectomy, the patient's blood pressure measured 87/67 mm Hg with a heart rate of 107  beats per minute. Post thrombectomy, pressures improved to 117/64 mm Hg with heart rate of 81 beats per minute. Patient's oxygenation improved from 93% on 15 L by non-rebreather 200% on 10 L by non-rebreather. FlowTriever device was removed. Bern catheter was advanced through the cord dry seal to the infrarenal inferior vena cava and venogram was performed in order to delineate the origin of the renal veins. Bern catheter was removed over a guidewire  and Denali IVC filter was deployed in the infrarenal vena cava. The [place] Dry Seal sheath was removed and hemostasis achieved with purse-string suture and manual compression. IMPRESSION: 1. Bilateral pulmonary angiogram and Inari sunction thrombectomy as above. 2. Denali IVC filter placed. Electronically Signed   By: Miachel Roux M.D.   On: 12/15/2020 18:38   IR IVC FILTER PLMT / S&I Hannah [name] GUID/MOD SED  Result Date: 12/15/2020 INDICATION: 60 year old woman with sub massive pulmonary artery emboli with RV LV ratio of 3.4. Patient has a history of GBM and cannot receive tPA. Prior to the procedure patient was on non-rebreather at 15 L, with oxygen saturation of 93%. Interventional radiology consulted for catheter directed thrombectomy of pulmonary embolism. IVC filter also requested given history of lower extremity DVT and GBM. EXAM: 1. Ultrasound-guided access of right common femoral vein 2. Bilateral pulmonary venogram 3. Bilateral pulmonary suction thrombectomy 4. IVC filter placement COMPARISON:  CT angiography chest 12/14/2020 MEDICATIONS: None. ANESTHESIA/SEDATION: None. The patient was monitored by the anesthesia team throughout the procedure given their tenuous cardiovascular status. FLUOROSCOPY TIME:  Fluoroscopy Time: 53 minutes 42 seconds (132 mGy). COMPLICATIONS: None immediate. TECHNIQUE: Informed written consent was obtained from the patient after a thorough discussion of the procedural risks, benefits and alternatives. All questions were addressed. Maximal Sterile Barrier Technique was utilized including caps, mask, sterile gowns, sterile gloves, sterile drape, hand hygiene and skin antiseptic. A timeout was performed prior to the initiation of the procedure. Right groin skin prepped and draped in usual fashion. The right common femoral vein was evaluated with ultrasound and shown to be patent. A permanent ultrasound image was obtained and placed in the patient's medical record. Using sterile gel and  a sterile probe cover the right common femoral vein was entered with a 21 gauge needle during real time ultrasound guidance. 21 gauge needle exchanged for transitional dilator set over 0.018 in guidewire. Transitional dilator set exchanged for 6 French sheath over 0.035 in guidewire. Despite multiple attempts with single and double angle pigtail catheter, the pulmonary artery could not be accessed. The short 6 French sheath was exchanged for a 90 cm 7 [place] sheath. The tip of the sheath position just below the cavoatrial junction. The long 7 French sheath allowed for additional stability and the main pulmonary artery was accessed with a 6 [place] double angle pigtail catheter. Pre thrombectomy pulmonary artery pressure: 54/31 mmHg (mean 41 mmHg) Double angled pigtail catheter removed over exchange length Amplatz guidewire. Serial dilation was performed and 7 [place] sheath exchanged for [employer] 24 French sheath. Utilizing a Bern catheter, the guidewire was directed into a right lower lobe pulmonary artery branch. 98 [place] FlowTriver device was advanced over the wire into the lateral basilar segmental and Truncus Anterior branches of the right pulmonary arterial system. Mechanical aspiration thrombectomy was performed extirpating thrombus. Post thrombectomy pulmonary angiogram demonstrated significant reduction in overall clot burden in the right pulmonary arterial branches. The left lateral basilar segmental branch of the lower lobe artery was selected with the burn catheter. The 20  [place] FlowTriver device was advanced over the wire into the lateral basilar segmental branches of the left pulmonary arterial system. No significant thrombus could be removed with the 20 [place] device. The 20 French device was removed and the 24 [place] FlowTriver device was inserted. The tip was advanced into the main left pulmonary artery. The T20 FlowTriver device was then inserted through the 24 French device into the  lateral basilar segment, and mechanical aspiration thrombectomy was performed extirpating thrombus. Post aspiration pulmonary angiogram demonstrated significant reduction in overall clot burden. Prior thrombectomy, the patient's blood pressure measured 87/67 mm Hg with a heart rate of 107 beats per minute. Post thrombectomy, pressures improved to 117/64 mm Hg with heart rate of 81 beats per minute. Patient's oxygenation improved from 93% on 15 L by non-rebreather 200% on 10 L by non-rebreather. FlowTriever device was removed. Bern catheter was advanced through the cord dry seal to the infrarenal inferior vena cava and venogram was performed in order to delineate the origin of the renal veins. Bern catheter was removed over a guidewire and Denali IVC filter was deployed in the infrarenal vena cava. The [place] Dry Seal sheath was removed and hemostasis achieved with purse-string suture and manual compression. IMPRESSION: 1. Bilateral pulmonary angiogram and Inari sunction thrombectomy as above. 2. Denali IVC filter placed. Electronically Signed   By: Miachel Roux M.D.   On: 12/15/2020 18:38   IR THROMBECT PRIM MECH INIT (INCLU) MOD SED  Result Date: 12/15/2020 INDICATION: 60 year old woman with sub massive pulmonary artery emboli with RV LV ratio of 3.4. Patient has a history of GBM and cannot receive tPA. Prior to the procedure patient was on non-rebreather at 15 L, with oxygen saturation of 93%. Interventional radiology consulted for catheter directed thrombectomy of pulmonary embolism. IVC filter also requested given history of lower extremity DVT and GBM. EXAM: 1. Ultrasound-guided access of right common femoral vein 2. Bilateral pulmonary venogram 3. Bilateral pulmonary suction thrombectomy 4. IVC filter placement COMPARISON:  CT angiography chest 12/14/2020 MEDICATIONS: None. ANESTHESIA/SEDATION: None. The patient was monitored by the anesthesia team throughout the procedure given their tenuous cardiovascular  status. FLUOROSCOPY TIME:  Fluoroscopy Time: 53 minutes 42 seconds (132 mGy). COMPLICATIONS: None immediate. TECHNIQUE: Informed written consent was obtained from the patient after a thorough discussion of the procedural risks, benefits and alternatives. All questions were addressed. Maximal Sterile Barrier Technique was utilized including caps, mask, sterile gowns, sterile gloves, sterile drape, hand hygiene and skin antiseptic. A timeout was performed prior to the initiation of the procedure. Right groin skin prepped and draped in usual fashion. The right common femoral vein was evaluated with ultrasound and shown to be patent. A permanent ultrasound image was obtained and placed in the patient's medical record. Using sterile gel and a sterile probe cover the right common femoral vein was entered with a 21 gauge needle during real time ultrasound guidance. 21 gauge needle exchanged for transitional dilator set over 0.018 in guidewire. Transitional dilator set exchanged for 6 French sheath over 0.035 in guidewire. Despite multiple attempts with single and double angle pigtail catheter, the pulmonary artery could not be accessed. The short 6 French sheath was exchanged for a 90 cm 7 [place] sheath. The tip of the sheath position just below the cavoatrial junction. The long 7 French sheath allowed for additional stability and the main pulmonary artery was accessed with a 6 [place] double angle pigtail catheter. Pre thrombectomy pulmonary artery pressure: 54/31 mmHg (mean 41 mmHg) Double angled pigtail catheter  removed over exchange length Amplatz guidewire. Serial dilation was performed and 7 [place] sheath exchanged for [employer] 24 French sheath. Utilizing a Bern catheter, the guidewire was directed into a right lower lobe pulmonary artery branch. 76 [place] FlowTriver device was advanced over the wire into the lateral basilar segmental and Truncus Anterior branches of the right pulmonary arterial system.  Mechanical aspiration thrombectomy was performed extirpating thrombus. Post thrombectomy pulmonary angiogram demonstrated significant reduction in overall clot burden in the right pulmonary arterial branches. The left lateral basilar segmental branch of the lower lobe artery was selected with the burn catheter. The 20 [place] FlowTriver device was advanced over the wire into the lateral basilar segmental branches of the left pulmonary arterial system. No significant thrombus could be removed with the 20 [place] device. The 20 French device was removed and the 24 [place] FlowTriver device was inserted. The tip was advanced into the main left pulmonary artery. The T20 FlowTriver device was then inserted through the 24 French device into the lateral basilar segment, and mechanical aspiration thrombectomy was performed extirpating thrombus. Post aspiration pulmonary angiogram demonstrated significant reduction in overall clot burden. Prior thrombectomy, the patient's blood pressure measured 87/67 mm Hg with a heart rate of 107 beats per minute. Post thrombectomy, pressures improved to 117/64 mm Hg with heart rate of 81 beats per minute. Patient's oxygenation improved from 93% on 15 L by non-rebreather 200% on 10 L by non-rebreather. FlowTriever device was removed. Bern catheter was advanced through the cord dry seal to the infrarenal inferior vena cava and venogram was performed in order to delineate the origin of the renal veins. Bern catheter was removed over a guidewire and Denali IVC filter was deployed in the infrarenal vena cava. The [place] Dry Seal sheath was removed and hemostasis achieved with purse-string suture and manual compression. IMPRESSION: 1. Bilateral pulmonary angiogram and Inari sunction thrombectomy as above. 2. Denali IVC filter placed. Electronically Signed   By: Miachel Roux M.D.   On: 12/15/2020 18:38   IR US Guide Vasc Access Right  Result Date: 12/15/2020 INDICATION: 60 year old woman with sub  massive pulmonary artery emboli with RV LV ratio of 3.4. Patient has a history of GBM and cannot receive tPA. Prior to the procedure patient was on non-rebreather at 15 L, with oxygen saturation of 93%. Interventional radiology consulted for catheter directed thrombectomy of pulmonary embolism. IVC filter also requested given history of lower extremity DVT and GBM. EXAM: 1. Ultrasound-guided access of right common femoral vein 2. Bilateral pulmonary venogram 3. Bilateral pulmonary suction thrombectomy 4. IVC filter placement COMPARISON:  CT angiography chest 12/14/2020 MEDICATIONS: None. ANESTHESIA/SEDATION: None. The patient was monitored by the anesthesia team throughout the procedure given their tenuous cardiovascular status. FLUOROSCOPY TIME:  Fluoroscopy Time: 53 minutes 42 seconds (132 mGy). COMPLICATIONS: None immediate. TECHNIQUE: Informed written consent was obtained from the patient after a thorough discussion of the procedural risks, benefits and alternatives. All questions were addressed. Maximal Sterile Barrier Technique was utilized including caps, mask, sterile gowns, sterile gloves, sterile drape, hand hygiene and skin antiseptic. A timeout was performed prior to the initiation of the procedure. Right groin skin prepped and draped in usual fashion. The right common femoral vein was evaluated with ultrasound and shown to be patent. A permanent ultrasound image was obtained and placed in the patient's medical record. Using sterile gel and a sterile probe cover the right common femoral vein was entered with a 21 gauge needle during real time ultrasound guidance.  21 gauge needle exchanged for transitional dilator set over 0.018 in guidewire. Transitional dilator set exchanged for 6 French sheath over 0.035 in guidewire. Despite multiple attempts with single and double angle pigtail catheter, the pulmonary artery could not be accessed. The short 6 French sheath was exchanged for a 90 cm 7 [place] sheath.  The tip of the sheath position just below the cavoatrial junction. The long 7 French sheath allowed for additional stability and the main pulmonary artery was accessed with a 6 [place] double angle pigtail catheter. Pre thrombectomy pulmonary artery pressure: 54/31 mmHg (mean 41 mmHg) Double angled pigtail catheter removed over exchange length Amplatz guidewire. Serial dilation was performed and 7 [place] sheath exchanged for [employer] 24 French sheath. Utilizing a Bern catheter, the guidewire was directed into a right lower lobe pulmonary artery branch. 11 [place] FlowTriver device was advanced over the wire into the lateral basilar segmental and Truncus Anterior branches of the right pulmonary arterial system. Mechanical aspiration thrombectomy was performed extirpating thrombus. Post thrombectomy pulmonary angiogram demonstrated significant reduction in overall clot burden in the right pulmonary arterial branches. The left lateral basilar segmental branch of the lower lobe artery was selected with the burn catheter. The 20 [place] FlowTriver device was advanced over the wire into the lateral basilar segmental branches of the left pulmonary arterial system. No significant thrombus could be removed with the 20 [place] device. The 20 French device was removed and the 24 [place] FlowTriver device was inserted. The tip was advanced into the main left pulmonary artery. The T20 FlowTriver device was then inserted through the 24 [place] device into the lateral basilar segment, and mechanical aspiration thrombectomy was performed extirpating thrombus. Post aspiration pulmonary angiogram demonstrated significant reduction in overall clot burden. Prior thrombectomy, the patient's blood pressure measured 87/67 mm Hg with a heart rate of 107 beats per minute. Post thrombectomy, pressures improved to 117/64 mm Hg with heart rate of 81 beats per minute. Patient's oxygenation improved from 93% on 15 L by non-rebreather 200%  on 10 L by non-rebreather. FlowTriever device was removed. Bern catheter was advanced through the cord dry seal to the infrarenal inferior vena cava and venogram was performed in order to delineate the origin of the renal veins. Bern catheter was removed over a guidewire and Denali IVC filter was deployed in the infrarenal vena cava. The [place] Dry Seal sheath was removed and hemostasis achieved with purse-string suture and manual compression. IMPRESSION: 1. Bilateral pulmonary angiogram and Inari sunction thrombectomy as above. 2. Denali IVC filter placed. Electronically Signed   By: Miachel Roux M.D.   On: 12/15/2020 18:38   EEG adult  Result Date: 12/15/2020 Hannah Havens, MD     12/15/2020 10:32 AM Patient Name: Hannah [name] MRN: [id number] Epilepsy Attending: Lora [name] Referring Physician/Provider: Dr Hannah [name] Date: 12/15/2020 Duration: 23.35 mins Patient history: 60 yo with GBM. EEG to evaluate for seizure Level of alertness: Awake AEDs during EEG study: LEV Technical aspects: This EEG study was done with scalp electrodes positioned according to the 10-20 International system of electrode placement. Electrical activity was acquired at a sampling rate of 500Hz  and reviewed with a high frequency filter of 70Hz  and a low frequency filter of 1Hz . EEG data were recorded continuously and digitally stored. Description: The posterior dominant rhythm consists of 10 Hz activity of moderate voltage (25-35 uV) seen predominantly in posterior head regions, symmetric and reactive to eye opening and eye closing. Hyperventilation and photic stimulation  were not performed.   IMPRESSION: This study is within normal limits. No seizures or epileptiform discharges were seen throughout the recording. Hannah [name]   ECHOCARDIOGRAM COMPLETE  Result Date: 12/16/2020    ECHOCARDIOGRAM REPORT   Patient Name:   Hannah [name] Date of Exam: 12/16/2020 Medical Rec #:  [id number]        Height:       64.0 in  Accession #:    [id number]       Weight:       132.7 lb Date of Birth:  May 09, 1961        BSA:          1.643 m Patient Age:    63 years         BP:           112/78 mmHg Patient Gender: F                HR:           76 bpm. Exam Location:  Inpatient Procedure: 2D Echo Indications:    congestive heart failure  History:        Patient has no prior history of Echocardiogram examinations.                 Pulmonary embolus; Signs/Symptoms:Altered Mental Status and                 Dyspnea.  Sonographer:    Johny Chess Referring Phys: [id number] Hannah [name] IMPRESSIONS  1. Left ventricular ejection fraction, by estimation, is 60 to 65%. The left ventricle has normal function. The left ventricle has no regional wall motion abnormalities. Left ventricular diastolic parameters were normal.  2. Right ventricular systolic function is normal. The right ventricular size is mildly enlarged. There is normal pulmonary artery systolic pressure.  3. The mitral valve is normal in structure. Trivial mitral valve regurgitation.  4. The aortic valve is tricuspid. Aortic valve regurgitation is not visualized.  5. The inferior vena cava is normal in size with <50% respiratory variability, suggesting right atrial pressure of 8 mmHg. Comparison(s): No prior Echocardiogram. FINDINGS  Left Ventricle: Left ventricular ejection fraction, by estimation, is 60 to 65%. The left ventricle has normal function. The left ventricle has no regional wall motion abnormalities. The left ventricular internal cavity size was normal in size. There is  no left ventricular hypertrophy. Left ventricular diastolic parameters were normal. Right Ventricle: The right ventricular size is mildly enlarged. No increase in right ventricular wall thickness. Right ventricular systolic function is normal. There is normal pulmonary artery systolic pressure. The tricuspid regurgitant velocity is 2.36  m/s, and with an assumed right atrial pressure of 3 mmHg, the  estimated right ventricular systolic pressure is 33.2 mmHg. Left Atrium: Left atrial size was normal in size. Right Atrium: Right atrial size was normal in size. Pericardium: There is no evidence of pericardial effusion. Mitral Valve: The mitral valve is normal in structure. Trivial mitral valve regurgitation. Tricuspid Valve: The tricuspid valve is normal in structure. Tricuspid valve regurgitation is mild. Aortic Valve: The aortic valve is tricuspid. Aortic valve regurgitation is not visualized. Pulmonic Valve: The pulmonic valve was normal in structure. Pulmonic valve regurgitation is trivial. Aorta: The aortic root and ascending aorta are structurally normal, with no evidence of dilitation. Venous: The inferior vena cava is normal in size with less than 50% respiratory variability, suggesting right atrial pressure of 8 mmHg. IAS/Shunts: No atrial level shunt detected by color  flow Doppler.  LEFT VENTRICLE PLAX 2D LVIDd:         3.70 cm  Diastology LVIDs:         2.40 cm  LV e' medial:    8.92 cm/s LV PW:         0.80 cm  LV E/e' medial:  8.6 LV IVS:        0.80 cm  LV e' lateral:   10.00 cm/s LVOT diam:     1.70 cm  LV E/e' lateral: 7.7 LV SV:         41 LV SV Index:   25 LVOT Area:     2.27 cm  RIGHT VENTRICLE             IVC RV S prime:     15.20 cm/s  IVC diam: 1.90 cm TAPSE (M-mode): 2.4 cm LEFT ATRIUM             Index       RIGHT ATRIUM           Index LA diam:        2.90 cm 1.76 cm/m  RA Area:     11.90 cm LA Vol (A2C):   37.8 ml 23.00 ml/m RA Volume:   24.80 ml  15.09 ml/m LA Vol (A4C):   24.8 ml 15.09 ml/m LA Biplane Vol: 31.5 ml 19.17 ml/m  AORTIC VALVE LVOT Vmax:   94.30 cm/s LVOT Vmean:  59.600 cm/s LVOT VTI:    0.179 m  AORTA Ao Root diam: 2.60 cm Ao Asc diam:  2.80 cm MITRAL VALVE               TRICUSPID VALVE MV Area (PHT): 3.48 cm    TR Peak grad:   22.3 mmHg MV Decel Time: 218 msec    TR Vmax:        236.00 cm/s MV E velocity: 76.70 cm/s MV A velocity: 61.30 cm/s  SHUNTS MV E/A ratio:   1.25        Systemic VTI:  0.18 m                            Systemic Diam: 1.70 cm Hannah Kaufman MD Electronically signed by Hannah Kaufman MD Signature Date/Time: 12/16/2020/2:06:17 PM    Final    VAS [place] LOWER EXTREMITY VENOUS (DVT)  Result Date: 12/16/2020  Lower Venous DVT Study Indications: Pulmonary embolism.  Comparison Study: No prior studies. Performing Technologist: Oliver Hum RVT  Examination Guidelines: A complete evaluation includes B-mode imaging, spectral Doppler, color Doppler, and power Doppler as needed of all accessible portions of each vessel. Bilateral testing is considered an integral part of a complete examination. Limited examinations for reoccurring indications may be performed as noted. The reflux portion of the exam is performed with the patient in reverse Trendelenburg.  +---------+---------------+---------+-----------+----------+--------------+ RIGHT    CompressibilityPhasicitySpontaneityPropertiesThrombus Aging +---------+---------------+---------+-----------+----------+--------------+ CFV      Full           Yes      Yes                                 +---------+---------------+---------+-----------+----------+--------------+ SFJ      Full                                                        +---------+---------------+---------+-----------+----------+--------------+  FV Prox  Full                                                        +---------+---------------+---------+-----------+----------+--------------+ FV Mid   None           No       No                   Acute          +---------+---------------+---------+-----------+----------+--------------+ FV DistalFull                                                        +---------+---------------+---------+-----------+----------+--------------+ PFV      Full                                                         +---------+---------------+---------+-----------+----------+--------------+ POP      Full           Yes      Yes                                 +---------+---------------+---------+-----------+----------+--------------+ PTV      Full                                                        +---------+---------------+---------+-----------+----------+--------------+ PERO     Full                                                        +---------+---------------+---------+-----------+----------+--------------+   +---------+---------------+---------+-----------+----------+--------------+ LEFT     CompressibilityPhasicitySpontaneityPropertiesThrombus Aging +---------+---------------+---------+-----------+----------+--------------+ CFV      Full           Yes      Yes                                 +---------+---------------+---------+-----------+----------+--------------+ SFJ      Full                                                        +---------+---------------+---------+-----------+----------+--------------+ FV Prox  Full                                                        +---------+---------------+---------+-----------+----------+--------------+  FV Mid   None           No       No                   Acute          +---------+---------------+---------+-----------+----------+--------------+ FV DistalNone           No       No                   Acute          +---------+---------------+---------+-----------+----------+--------------+ PFV      Full                                                        +---------+---------------+---------+-----------+----------+--------------+ POP      Full           Yes      Yes                                 +---------+---------------+---------+-----------+----------+--------------+ PTV      Full                                                         +---------+---------------+---------+-----------+----------+--------------+ PERO     Full                                                        +---------+---------------+---------+-----------+----------+--------------+     Summary: RIGHT: - Findings consistent with acute deep vein thrombosis involving the right femoral vein. - No cystic structure found in the popliteal fossa.  LEFT: - Findings consistent with acute deep vein thrombosis involving the left femoral vein. - No cystic structure found in the popliteal fossa.  *See table(s) above for measurements and observations. Electronically signed by Hannah Barban MD on 12/16/2020 at 8:01:57 PM.    Final        Subjective: Patient is feeling well.  She has mild right groin [name], without lump or mass or pulsatile sensation.  She has some warmth in the right leg.  No significant swelling of either leg.  No confusion, syncope, chest [name].  Discharge Exam: Vitals:   12/19/20 0812 12/19/20 0813  BP: 111/79   Pulse: 71 71  Resp:    Temp:    SpO2:  98%   Vitals:   12/19/20 0400 12/19/20 0700 12/19/20 0812 12/19/20 0813  BP: 119/81  111/79   Pulse:   71 71  Resp:      Temp: 98.6 F (37 C) 98.1 F (36.7 C)    TempSrc:  Oral    SpO2:    98%  Weight:      Height:        General: Pt is alert, awake, not in acute distress, lying in bed, comfortable, interactive. Cardiovascular: RRR, nl S1-S2, no murmurs appreciated.   No LE edema.  There is no  pulsatile mass in the right groin, her venipuncture site looks clean dry and intact.  Right leg has no swelling or skin change. Respiratory: Normal respiratory rate and rhythm.  CTAB without rales or wheezes. Abdominal: Abdomen soft and non-tender.  No distension or HSM.   Neuro/Psych: Strength symmetric in upper and lower extremities.  Judgment and insight appear normal.   The results of significant diagnostics from this hospitalization (including imaging, microbiology, ancillary and  laboratory) are listed below for reference.     Microbiology: Recent Results (from the past 240 hour(s))  Resp Panel by RT-PCR (Flu A&B, Covid) Nasopharyngeal Swab     Status: None   Collection Time: 12/14/20 11:21 PM   Specimen: Nasopharyngeal Swab; Nasopharyngeal(NP) swabs in vial transport medium  Result Value Ref Range Status   SARS Coronavirus 2 by RT PCR NEGATIVE NEGATIVE Final    Comment: (NOTE) SARS-CoV-2 target nucleic acids are NOT DETECTED.  The SARS-CoV-2 RNA is generally detectable in upper respiratory specimens during the acute phase of infection. The lowest concentration of SARS-CoV-2 viral copies this assay can detect is 138 copies/mL. A negative result does not preclude SARS-Cov-2 infection and should not be used as the sole basis for treatment or other patient management decisions. A negative result may occur with  improper specimen collection/handling, submission of specimen other than nasopharyngeal swab, presence of viral mutation(s) within the areas targeted by this assay, and inadequate number of viral copies(<138 copies/mL). A negative result must be combined with clinical observations, patient history, and epidemiological information. The expected result is Negative.  Fact Sheet for Patients:  EntrepreneurPulse.com.au  Fact Sheet for Healthcare Providers:  IncredibleEmployment.be  This test is no t yet approved or cleared by the [place] FDA and  has been authorized for detection and/or diagnosis of SARS-CoV-2 by FDA under an Emergency Use Authorization (EUA). This EUA will remain  in effect (meaning this test can be used) for the duration of the COVID-19 declaration under Section 564(b)(1) of the Act, 21 U.S.C.section 360bbb-3(b)(1), unless the authorization is terminated  or revoked sooner.       Influenza A by PCR NEGATIVE NEGATIVE Final   Influenza B by PCR NEGATIVE NEGATIVE Final    Comment: (NOTE) The  Xpert Xpress SARS-CoV-2/FLU/RSV plus assay is intended as an aid in the diagnosis of influenza from Nasopharyngeal swab specimens and should not be used as a sole basis for treatment. Nasal washings and aspirates are unacceptable for Xpert Xpress SARS-CoV-2/FLU/RSV testing.  Fact Sheet for Patients: EntrepreneurPulse.com.au  Fact Sheet for Healthcare Providers: IncredibleEmployment.be  This test is not yet approved or cleared by the [place] FDA and has been authorized for detection and/or diagnosis of SARS-CoV-2 by FDA under an Emergency Use Authorization (EUA). This EUA will remain in effect (meaning this test can be used) for the duration of the COVID-19 declaration under Section 564(b)(1) of the Act, 21 U.S.C. section 360bbb-3(b)(1), unless the authorization is terminated or revoked.  Performed at [facility], [place] [address], [place],  [address]      Labs: BNP (last 3 results) Recent Labs    12/16/20 1315  BNP 431.5*   Basic Metabolic Panel: Recent Labs  Lab 12/14/20 1207 12/14/20 2138 12/14/20 2145 12/15/20 0510 12/17/20 0352  NA 141 136 135 143 140  K 4.0 4.8 4.8 3.8 3.6  CL 104 102 102 113* 106  CO2 26 20*  --  19* 25  GLUCOSE 118* 280* 268* 124* 94  BUN 13 21* 21*  18 15  CREATININE 0.87 1.18* 1.00 0.60 0.81  CALCIUM 9.4 8.3*  --  7.7* 8.3*  MG  --  2.2  --  1.9 2.0  PHOS  --   --   --   --  3.0   Liver Function Tests: Recent Labs  Lab 12/14/20 1207 12/14/20 2138 12/17/20 0352  AST 13* 141* 27  ALT 9 92* 66*  ALKPHOS 62 81 56  BILITOT 0.5 0.9 0.3  PROT 6.8 5.9* 4.8*  ALBUMIN 3.5 3.0* 2.5*   No results for input(s): LIPASE, AMYLASE in the last 168 hours. No results for input(s): AMMONIA in the last 168 hours. CBC: Recent Labs  Lab 12/14/20 1207 12/14/20 2138 12/14/20 2145 12/16/20 0225 12/16/20 1315 12/17/20 0352 12/18/20 0201 12/19/20 0225  WBC 7.8 11.4*   < > 6.1 7.9  5.0 5.6 6.9  NEUTROABS 6.6 10.3*  --   --   --   --   --   --   HGB 13.7 12.1   < > 9.2* 9.6* 9.0* 9.8* 10.3*  HCT 39.5 36.5   < > 27.6* 28.7* 27.0* 29.9* 30.9*  MCV 94.7 99.7   < > 97.5 97.6 98.5 99.0 96.6  PLT 137* 135*   < > 117* 125* 125* 150 170   < > = values in this interval not displayed.   Cardiac Enzymes: No results for input(s): CKTOTAL, CKMB, CKMBINDEX, TROPONINI in the last 168 hours. BNP: Invalid input(s): POCBNP CBG: Recent Labs  Lab 12/15/20 1938 12/15/20 2343 12/16/20 0317 12/16/20 0725 12/16/20 1158  GLUCAP 113* 92 92 86 78   D-Dimer No results for input(s): DDIMER in the last 72 hours. Hgb A1c No results for input(s): HGBA1C in the last 72 hours. Lipid Profile No results for input(s): CHOL, HDL, LDLCALC, TRIG, CHOLHDL, LDLDIRECT in the last 72 hours. Thyroid function studies No results for input(s): TSH, T4TOTAL, T3FREE, THYROIDAB in the last 72 hours.  Invalid input(s): FREET3 Anemia work up No results for input(s): VITAMINB12, FOLATE, FERRITIN, TIBC, IRON, RETICCTPCT in the last 72 hours. Urinalysis    Component Value Date/Time   COLORURINE STRAW (A) 09/07/2020 1525   APPEARANCEUR CLEAR 09/07/2020 1525   LABSPEC 1.004 (L) 09/07/2020 1525   PHURINE 6.0 09/07/2020 1525   GLUCOSEU NEGATIVE 09/07/2020 1525   GLUCOSEU NEGATIVE 09/03/2013 0902   HGBUR NEGATIVE 09/07/2020 1525   BILIRUBINUR NEGATIVE 09/07/2020 1525   BILIRUBINUR negative 04/07/2015 1032   KETONESUR NEGATIVE 09/07/2020 1525   PROTEINUR NEGATIVE 09/07/2020 1525   UROBILINOGEN 0.2 04/07/2015 1032   UROBILINOGEN 0.2 09/03/2013 0902   NITRITE NEGATIVE 09/07/2020 1525   LEUKOCYTESUR NEGATIVE 09/07/2020 1525   Sepsis Labs Invalid input(s): PROCALCITONIN,  WBC,  LACTICIDVEN Microbiology Recent Results (from the past 240 hour(s))  Resp Panel by RT-PCR (Flu A&B, Covid) Nasopharyngeal Swab     Status: None   Collection Time: 12/14/20 11:21 PM   Specimen: Nasopharyngeal Swab;  Nasopharyngeal(NP) swabs in vial transport medium  Result Value Ref Range Status   SARS Coronavirus 2 by RT PCR NEGATIVE NEGATIVE Final    Comment: (NOTE) SARS-CoV-2 target nucleic acids are NOT DETECTED.  The SARS-CoV-2 RNA is generally detectable in upper respiratory specimens during the acute phase of infection. The lowest concentration of SARS-CoV-2 viral copies this assay can detect is 138 copies/mL. A negative result does not preclude SARS-Cov-2 infection and should not be used as the sole basis for treatment or other patient management decisions. A negative result may occur with  improper  specimen collection/handling, submission of specimen other than nasopharyngeal swab, presence of viral mutation(s) within the areas targeted by this assay, and inadequate number of viral copies(<138 copies/mL). A negative result must be combined with clinical observations, patient history, and epidemiological information. The expected result is Negative.  Fact Sheet for Patients:  EntrepreneurPulse.com.au  Fact Sheet for Healthcare Providers:  IncredibleEmployment.be  This test is no t yet approved or cleared by the [place] FDA and  has been authorized for detection and/or diagnosis of SARS-CoV-2 by FDA under an Emergency Use Authorization (EUA). This EUA will remain  in effect (meaning this test can be used) for the duration of the COVID-19 declaration under Section 564(b)(1) of the Act, 21 U.S.C.section 360bbb-3(b)(1), unless the authorization is terminated  or revoked sooner.       Influenza A by PCR NEGATIVE NEGATIVE Final   Influenza B by PCR NEGATIVE NEGATIVE Final    Comment: (NOTE) The Xpert Xpress SARS-CoV-2/FLU/RSV plus assay is intended as an aid in the diagnosis of influenza from Nasopharyngeal swab specimens and should not be used as a sole basis for treatment. Nasal washings and aspirates are unacceptable for Xpert Xpress  SARS-CoV-2/FLU/RSV testing.  Fact Sheet for Patients: EntrepreneurPulse.com.au  Fact Sheet for Healthcare Providers: IncredibleEmployment.be  This test is not yet approved or cleared by the [place] FDA and has been authorized for detection and/or diagnosis of SARS-CoV-2 by FDA under an Emergency Use Authorization (EUA). This EUA will remain in effect (meaning this test can be used) for the duration of the COVID-19 declaration under Section 564(b)(1) of the Act, 21 U.S.C. section 360bbb-3(b)(1), unless the authorization is terminated or revoked.  Performed at [name], [place] [address] [place],  [address]      Time coordinating discharge: 35 minutes      SIGNED:   Edwin Dada, MD  [facility] 12/19/2020, 9:13 AM

## 2020-12-19 NOTE — Progress Notes (Signed)
I connected with Alayziah A Dahan on 12/19/20 at 11:00 AM EST by telephone visit and verified that I am speaking with the correct person using two identifiers.  I discussed the limitations, risks, security and privacy concerns of performing an evaluation and management service by telemedicine and the availability of in-person appointments. I also discussed with the patient that there may be a patient responsible charge related to this service. The patient expressed understanding and agreed to proceed.   Other persons participating in the visit and their role in the encounter:  son  Patient's location:  Home  Provider's location:  Office   Chief Complaint:  GBM (glioblastoma multiforme) ([place])  Seizure disorder ([place])  Acute massive pulmonary embolism ([place])  History of Present Ilness: Yesika A Azbill just arrived home after admission for saddle pulmonary embolism and thrombectomy.  She is tolerating eliquis well without complication thus far.  She continues to describe shortness of breath, but overall feels significantly improved from last Friday.  Her energy level is very low following the hospitalization and surgery.  She continues on 8mg  decadron daily as prior.  Difficult to assess decadron affect on left sided weakness on setting of severe fatigue and recent hospitalization/surgery. Observations: Language and cognition at baseline Assessment and Plan: GBM (glioblastoma multiforme) ([place])  Seizure disorder ([place])  Acute massive pulmonary embolism ([place])  Ok with continuing Eliquis from neuro-oncologic standpoint.  Decadron should remain at 8mg  daily for time being Not ready yet to initiate next cycle of Temozolomide, still in recovery mode  Follow Up Instructions: We would like to see her in the clinic early next week for re-assessment of candidacy for chemo, continued steroid titration.  She is agreeable to this plan.  I discussed the assessment and treatment plan with the patient.  The  patient was provided an opportunity to ask questions and all were answered.  The patient agreed with the plan and demonstrated understanding of the instructions.    The patient was advised to call back or seek an in-person evaluation if the symptoms worsen or if the condition fails to improve as anticipated.   [name], MD   I provided 15 minutes of non face-to-face telephone visit time during this encounter, and > 50% was spent counseling as documented under my assessment & plan.

## 2020-12-19 NOTE — Progress Notes (Signed)
Patient discharged to home with her husband . Instructions for discharge discussed and patient and her husband do not want home PT and OT

## 2020-12-19 NOTE — Progress Notes (Signed)
SATURATION QUALIFICATIONS: (This note is used to comply with regulatory documentation for home oxygen)  Patient Saturations on Room Air at Rest = 98%  Patient Saturations on Room Air while Ambulating = 93%  Patient Saturations on 0 Liters of oxygen while Ambulating = 93%  Please briefly explain why patient needs home oxygen: 

## 2020-12-20 ENCOUNTER — Telehealth: Payer: Self-pay

## 2020-12-20 NOTE — Telephone Encounter (Signed)
Transition Care Management Follow-up Telephone Call  Date of discharge and from where: 12/19/20-[facility]  How have you been since you were released from the hospital? Feeling ok but still a little short of breath.  Any questions or concerns? No  Items Reviewed:  Did the pt receive and understand the discharge instructions provided? Yes   Medications obtained and verified? Yes   Other? Yes   Any new allergies since your discharge? No   Dietary orders reviewed? Yes  Do you have support at home? Yes   Home Care and Equipment/Supplies: Were home health services ordered? Yes-for PT/OT but patient refused. If so, what is the name of the agency? n/a  Has the agency set up a time to come to the patient's home? not applicable Were any new equipment or medical supplies ordered?  No What is the name of the medical supply agency? n/a Were you able to get the supplies/equipment? not applicable Do you have any questions related to the use of the equipment or supplies? n/a  Functional Questionnaire: (I = Independent and D = Dependent) ADLs: I with assistance  Bathing/Dressing- I with assistance  Meal Prep- I with assistance  Eating- I  Maintaining continence- I  Transferring/Ambulation- I with assistance  Managing Meds- I  Follow up appointments reviewed:   PCP Hospital f/u appt confirmed? Yes  Scheduled to see Dr. Raoul Pitch on 12/27/20 @ 11:00.  [facility] f/u appt confirmed? Yes  Scheduled to see Dr. Mickeal Skinner on 12/25/20 @ 2:30.  Are transportation arrangements needed? No   If their condition worsens, is the pt aware to call PCP or go to the Emergency Dept.? Yes  Was the patient provided with contact information for the PCP's office or ED? Yes  Was to pt encouraged to call back with questions or concerns? Yes

## 2020-12-21 ENCOUNTER — Encounter: Payer: Self-pay | Admitting: *Deleted

## 2020-12-21 ENCOUNTER — Other Ambulatory Visit: Payer: Self-pay | Admitting: *Deleted

## 2020-12-21 MED FILL — ELIQUIS 5 MG TABLET: 5 | 30 days supply | Qty: 60 | Fill #0

## 2020-12-21 NOTE — Patient Outreach (Signed)
[facility]) Care Management  12/21/2020  Hannah [name] 03/20/1961 [id number]   Transition of care call/case closure   Referral received:12/18/20 Initial outreach:12/21/20 Insurance: [employer]    Subjective: Initial successful telephone call to patient's preferred number in order to complete transition of care assessment; 2 HIPAA identifiers verified. Explained purpose of call and completed transition of care assessment   Hannah [name] states that she is some better, she reports still having some shortness of breath noted most when talking.   She reports having some swelling at right leg and feeling warm to touch , she states she was warned during hospital stay  that she may develop DVT , but the treatment would be the same. Reinforced notifying MD of worsening shortness, swelling or pain. She reports  tolerating diet states that family available to assist with fixing her meals, her plate cutting up meats. She   denies bowel or bladder problems. She reports weakness on right side weakness due to glioblastoma , she is now using a walker, and family assist with mobility in home.   Spouse/children are assisting with her recovery, assisting with all care as needed.  Reviewed accessing the following [place] Benefits : She does  have the hospital indemnity plan and has contact information to file claim . She reports being on medical leave.  She  uses a Ambulance person, at  [facility]   Objective:   Hannah [name]  was hospitalized at [facility] 2/11-2/14 for Acute Saddle Pulmonary embolus, Thrombectomy and IVC filter by interventional radiology.  Comorbidities/PMH include:left frontal  Glioblastoma  chemotherapy and radiation, Craniotomy 02/16/20 She was discharged to home on 12/18/20  without the need for home health services or DME.   Assessment:  Patient voices good understanding of all discharge instructions.  See transition of care flowsheet  for assessment details.   Plan:  Reviewed hospital discharge diagnosis of Saddle Pulmonary embolus, thrombectomy IVC filter    and discharge treatment plan using hospital discharge instructions, assessing medication adherence, reviewing problems requiring provider notification, and discussing the importance of follow up with surgeon, primary care provider and/or specialists as directed.  Reviewed [place] healthy lifestyle program information to receive discounted premium for  2023   Step 1: Get  your annual physical  Step 2: Complete your health assessment  Step 3:Identify your current health status and complete the corresponding action step between November 04, 2020 and July 05, 2021.      No ongoing care management needs identified so will close case to [facility] Management services and route successful outreach letter with [facility] Management pamphlet and 24 Hour Nurse Line Magnet to [facility] clinical pool to be mailed to patient's home address.    Joylene Draft, RN, BSN  [facility] Management Coordinator  [phone]- Mobile [phone]- Toll Free Main Office

## 2020-12-22 ENCOUNTER — Other Ambulatory Visit: Payer: Self-pay | Admitting: Internal Medicine

## 2020-12-22 DIAGNOSIS — C719 Malignant neoplasm of brain, unspecified: Secondary | ICD-10-CM

## 2020-12-22 NOTE — Telephone Encounter (Signed)
Refill request

## 2020-12-25 ENCOUNTER — Inpatient Hospital Stay: Payer: 59

## 2020-12-25 ENCOUNTER — Inpatient Hospital Stay (HOSPITAL_BASED_OUTPATIENT_CLINIC_OR_DEPARTMENT_OTHER): Payer: 59 | Admitting: Internal Medicine

## 2020-12-25 ENCOUNTER — Other Ambulatory Visit: Payer: Self-pay

## 2020-12-25 VITALS — BP 118/75 | HR 55 | Temp 98.0°F | Resp 18 | Ht 64.0 in | Wt 131.8 lb

## 2020-12-25 DIAGNOSIS — Z833 Family history of diabetes mellitus: Secondary | ICD-10-CM | POA: Diagnosis not present

## 2020-12-25 DIAGNOSIS — G40909 Epilepsy, unspecified, not intractable, without status epilepticus: Secondary | ICD-10-CM | POA: Diagnosis not present

## 2020-12-25 DIAGNOSIS — Z79899 Other long term (current) drug therapy: Secondary | ICD-10-CM | POA: Diagnosis not present

## 2020-12-25 DIAGNOSIS — C711 Malignant neoplasm of frontal lobe: Secondary | ICD-10-CM | POA: Diagnosis not present

## 2020-12-25 DIAGNOSIS — Z7901 Long term (current) use of anticoagulants: Secondary | ICD-10-CM | POA: Diagnosis not present

## 2020-12-25 DIAGNOSIS — G4700 Insomnia, unspecified: Secondary | ICD-10-CM | POA: Diagnosis not present

## 2020-12-25 DIAGNOSIS — C719 Malignant neoplasm of brain, unspecified: Secondary | ICD-10-CM

## 2020-12-25 DIAGNOSIS — I2699 Other pulmonary embolism without acute cor pulmonale: Secondary | ICD-10-CM

## 2020-12-25 DIAGNOSIS — Z923 Personal history of irradiation: Secondary | ICD-10-CM | POA: Diagnosis not present

## 2020-12-25 DIAGNOSIS — Z87891 Personal history of nicotine dependence: Secondary | ICD-10-CM | POA: Diagnosis not present

## 2020-12-25 DIAGNOSIS — Z7952 Long term (current) use of systemic steroids: Secondary | ICD-10-CM | POA: Diagnosis not present

## 2020-12-25 DIAGNOSIS — Z8249 Family history of ischemic heart disease and other diseases of the circulatory system: Secondary | ICD-10-CM | POA: Diagnosis not present

## 2020-12-25 LAB — CBC WITH DIFFERENTIAL (CANCER CENTER ONLY)
Abs Immature Granulocytes: 0.05 10*3/uL (ref 0.00–0.07)
Basophils Absolute: 0 10*3/uL (ref 0.0–0.1)
Basophils Relative: 0 %
Eosinophils Absolute: 0 10*3/uL (ref 0.0–0.5)
Eosinophils Relative: 0 %
HCT: 35.2 % — ABNORMAL LOW (ref 36.0–46.0)
Hemoglobin: 12.1 g/dL (ref 12.0–15.0)
Immature Granulocytes: 1 %
Lymphocytes Relative: 6 %
Lymphs Abs: 0.4 10*3/uL — ABNORMAL LOW (ref 0.7–4.0)
MCH: 33.2 pg (ref 26.0–34.0)
MCHC: 34.4 g/dL (ref 30.0–36.0)
MCV: 96.7 fL (ref 80.0–100.0)
Monocytes Absolute: 0.2 10*3/uL (ref 0.1–1.0)
Monocytes Relative: 3 %
Neutro Abs: 6.1 10*3/uL (ref 1.7–7.7)
Neutrophils Relative %: 90 %
Platelet Count: 249 10*3/uL (ref 150–400)
RBC: 3.64 MIL/uL — ABNORMAL LOW (ref 3.87–5.11)
RDW: 13.1 % (ref 11.5–15.5)
WBC Count: 6.8 10*3/uL (ref 4.0–10.5)
nRBC: 0 % (ref 0.0–0.2)

## 2020-12-25 LAB — CMP (CANCER CENTER ONLY)
ALT: 22 U/L (ref 0–44)
AST: 14 U/L — ABNORMAL LOW (ref 15–41)
Albumin: 3.8 g/dL (ref 3.5–5.0)
Alkaline Phosphatase: 56 U/L (ref 38–126)
Anion gap: 10 (ref 5–15)
BUN: 19 mg/dL (ref 6–20)
CO2: 26 mmol/L (ref 22–32)
Calcium: 9.1 mg/dL (ref 8.9–10.3)
Chloride: 104 mmol/L (ref 98–111)
Creatinine: 0.83 mg/dL (ref 0.44–1.00)
GFR, Estimated: >60 mL/min (ref >60)
Glucose, Bld: 121 mg/dL — ABNORMAL HIGH (ref 70–99)
Potassium: 4.3 mmol/L (ref 3.5–5.1)
Sodium: 140 mmol/L (ref 135–145)
Total Bilirubin: 0.3 mg/dL (ref 0.3–1.2)
Total Protein: 6.5 g/dL (ref 6.5–8.1)

## 2020-12-25 NOTE — Progress Notes (Signed)
[facility] [address], [place] [address] [phone]   Interval Evaluation  Date of Service: 12/25/20 Patient Name: Hannah [name] Patient MRN: [id number] Patient DOB: [birth year] Provider: [name], MD  Identifying Statement:  Hannah [name] is a 60 y.o. female with left frontal glioblastoma   Oncologic History: Oncology History  GBM (glioblastoma multiforme) ([facility])   Initial Diagnosis   GBM (glioblastoma multiforme) ([place])   02/17/2020 Surgery   Craniotomy, left frontal resection by Dr. Marcello Moores.  Path is glioblastoma.   08/28/2020 - 10/09/2020 Radiation Therapy   Radiation and concurrent Temozolomide 45m/m2 daily     Biomarkers:  MGMT Methylated.  IDH 1/2 Wild type.  EGFR Unknown  TERT Unknown   Interval History:  Hannah [name] presents today for follow up after recent hospitalization.  She describes considerable improvement in overall function over the past few days, and is now walking on her own (despite some remaining balance issues).  Otherwise now independent with ADLs.  Decadron currently dosed at 681mdaily, she had decreased it from 39m71mver the weekend.  Mild swelling in her right leg, no shortness of breath or chest/back pain.  Decadron 11/09/20: 2mg51m/10/22: 39mg 77m21/22: 6mg  60m (01/20/20) Patient presented one month ago with sudden onset left arm and leg weakness and interrupted speech, [name], [name], stroke eval and CNS imaging which demonstrated a non-enhancing left frontal mass.  At this time she is back to baseline without recurrence of events, taking Keppra 500mg t35m per day.  No history or seizure or any other neurologic events.  She does describe being sleep deprived prior to seizure event.  She presents today after one month follow up MRI  scan.  Medications: Current Outpatient Medications on File Prior to Visit  Medication Sig Dispense Refill  . apixaban (ELIQUIS) 5 MG TABS tablet Take 1 tablet (5 mg total) by mouth 2 (two) times daily. 60 tablet 3  . apixaban (ELIQUIS) 5 MG TABS tablet Take apixaban 10 mg (2 tabs) twice daily for 6more d45mothen take apixaban 5 mg (1 tab) twice daily 70 tablet 0  . cholecalciferol (VITAMIN D3) 25 MCG (1000 UNIT) tablet Take 1,000 Units by mouth daily.    . cyclobenzaprine (FLEXERIL) 10 MG tablet Take 1 tablet (10 mg total) by mouth 3 (three) times daily as needed for muscle spasms. 30 tablet 0  . dexamethasone (DECADRON) 1 MG tablet Take 8 tablets (8 mg total) by mouth daily.    . docusaMarland Kitchene sodium (COLACE) 100 MG capsule Take 100 mg by mouth 2 (two) times daily as needed for mild constipation.    . HYDROcMarland Kitchendone-acetaminophen (NORCO/VICODIN) 5-325 MG tablet Take 1 tablet by mouth every 6 (six) hours as needed. 20 tablet 0  . levETIRAcetam (KEPPRA) 500 MG tablet Take 1 tablet (500 mg total) by mouth 2 (two) times daily. 60 tablet 3  . LORazepam (ATIVAN) 0.5 MG tablet Place 1 tablet (0.5 mg total) under the tongue every 6 (six) hours as needed for anxiety. (Patient not taking: No sig reported) 45 tablet 0  . ondansetron (ZOFRAN) 8 MG tablet Take 1 tablet (8 mg total) by mouth 2 (two) times daily as needed (nausea and vomiting). May take 30-60 minutes prior to Temodar administration if nausea/vomiting occurs. (Patient not taking: No sig reported) 30  tablet 1  . temozolomide (TEMODAR) 100 MG capsule Take 1 capsule (100 mg total) by mouth daily. May take on an empty stomach to decrease nausea & vomiting. 42 capsule 0  . temozolomide (TEMODAR) 20 MG capsule Take 1 capsule (20 mg total) by mouth daily. May take on an empty stomach to decrease nausea & vomiting. 42 capsule 0   No current facility-administered medications on file prior to visit.    Allergies: No Known Allergies Past Medical History:  Past  Medical History:  Diagnosis Date  . Cervical spondylolysis 12/15/2019   Skeleton: Mild cervical spondylosis C6-7. Incidental find.   . Colon polyp   . GBM (glioblastoma multiforme) ([facility])   . Left ankle sprain 10/07/2011  . Seizure ([facility])    partial   . Tick bite 02/2020   Past Surgical History:  Past Surgical History:  Procedure Laterality Date  . APPLICATION OF CRANIAL NAVIGATION Left 02/16/2020   Procedure: APPLICATION OF CRANIAL NAVIGATION;  Surgeon: Vallarie Mare, MD;  Location: [facility];  Service: Neurosurgery;  Laterality: Left;  . CRANIOTOMY Left 02/16/2020   Procedure: LEFT FRONTAL CRANIOTOMY FOR BRAIN TUMOR;  Surgeon: Vallarie Mare, MD;  Location: [facility];  Service: Neurosurgery;  Laterality: Left;  . FOOT SURGERY Left    bone spur - local anesthesia  . IR ANGIOGRAM PULMONARY BILATERAL SELECTIVE  12/15/2020  . IR ANGIOGRAM SELECTIVE EACH ADDITIONAL VESSEL  12/15/2020  . IR ANGIOGRAM SELECTIVE EACH ADDITIONAL VESSEL  12/15/2020  . IR IVC FILTER PLMT / S&I /IMG GUID/MOD SED  12/15/2020  . IR THROMBECT PRIM MECH INIT (INCLU) MOD SED  12/15/2020  . IR THROMBECT PRIM MECH INIT (INCLU) MOD SED  12/15/2020  . RADIOLOGY WITH ANESTHESIA N/A 12/15/2020   Procedure: IR WITH ANESTHESIA;  Surgeon: Radiologist, Medication, MD;  Location: [facility];  Service: Radiology;  Laterality: N/A;  . TONSILECTOMY/ADENOIDECTOMY WITH MYRINGOTOMY     tonsils and adenoids out only  . WISDOM TOOTH EXTRACTION     Social History:  Social History   Socioeconomic History  . Marital status: Married    Spouse name: Not on file  . Number of children: 3  . Years of education: Not on file  . Highest education level: Not on file  Occupational History  . Not on file  Tobacco Use  . Smoking status: Former Smoker    Years: 2.00    Types: Cigarettes    Quit date: 11/05/1979    Years since quitting: 41.1  . Smokeless tobacco: Never Used  . Tobacco comment: 1/2 pack a week  Vaping Use  . Vaping Use: Never used   Substance and Sexual Activity  . Alcohol use: Yes    Alcohol/week: 0.0 standard drinks    Comment: 1 glass of wine weekly  . Drug use: No  . Sexual activity: Yes    Birth control/protection: Surgical  Other Topics Concern  . Not on file  Social History Narrative  . Not on file   Social Determinants of Health   Financial Resource Strain: Not on file  Food Insecurity: Not on file  Transportation Needs: Not on file  Physical Activity: Not on file  Stress: Not on file  Social Connections: Not on file  Intimate Partner Violence: Not on file   Family History:  Family History  Problem Relation Age of Onset  . Hypertension Mother   . Stroke Mother   . Diabetes Mother   . Colon cancer Father 69  . Hypertension Sister   .  Colon polyps Sister   . Heart disease Brother 48  . Diabetes Maternal Uncle   . Diabetes Maternal Uncle     Review of Systems: Constitutional: Doesn't report fevers, chills or abnormal weight loss Eyes: Doesn't report blurriness of vision Ears, nose, mouth, throat, and face: Doesn't report sore throat Respiratory: Chest wall pain Cardiovascular: Doesn't report palpitation, chest discomfort  Gastrointestinal:  Doesn't report nausea, constipation, diarrhea GU: Doesn't report incontinence Skin: Doesn't report skin rashes Neurological: Per HPI Musculoskeletal: Doesn't report joint pain Behavioral/Psych: Doesn't report anxiety  Physical Exam: Vitals:   12/25/20 1447  BP: 118/75  Pulse: (!) 55  Resp: 18  Temp: 98 F (36.7 C)  SpO2: 99%   KPS: 60. General: Alert, cooperative, pleasant, in no acute distress Head: Normal EENT: No conjunctival injection or scleral icterus.  Lungs: Resp effort normal Cardiac: Regular rate Abdomen: Non-distended abdomen Skin: No rashes cyanosis or petechiae. Extremities: No clubbing or edema  Neurologic Exam: Mental Status: Awake, alert, attentive to examiner. Oriented to self and environment. Language is fluent  with intact comprehension.  Occasional interruptions in fluency.  Cranial Nerves: Visual acuity is grossly normal. Visual fields are full. Extra-ocular movements intact. No ptosis. Face is symmetric Motor: Tone and bulk are normal. Full power in arms and legs. Reflexes are symmetric, no pathologic reflexes present.  Sensory: Intact to light touch Gait: Independent but dystaxic  Labs: I have reviewed the data as listed    Component Value Date/Time   NA 140 12/17/2020 0352   K 3.6 12/17/2020 0352   CL 106 12/17/2020 0352   CO2 25 12/17/2020 0352   GLUCOSE 94 12/17/2020 0352   GLUCOSE 85 10/13/2006 1158   BUN 15 12/17/2020 0352   CREATININE 0.81 12/17/2020 0352   CREATININE 0.87 12/14/2020 1207   CALCIUM 8.3 (L) 12/17/2020 0352   PROT 4.8 (L) 12/17/2020 0352   ALBUMIN 2.5 (L) 12/17/2020 0352   AST 27 12/17/2020 0352   AST 13 (L) 12/14/2020 1207   ALT 66 (H) 12/17/2020 0352   ALT 9 12/14/2020 1207   ALKPHOS 56 12/17/2020 0352   BILITOT 0.3 12/17/2020 0352   BILITOT 0.5 12/14/2020 1207   GFRNONAA >60 12/17/2020 0352   GFRNONAA >60 12/14/2020 1207   GFRAA >60 02/17/2020 0136   Lab Results  Component Value Date   WBC 6.8 12/25/2020   NEUTROABS 6.1 12/25/2020   HGB 12.1 12/25/2020   HCT 35.2 (L) 12/25/2020   MCV 96.7 12/25/2020   PLT 249 12/25/2020    Assessment/Plan GBM (glioblastoma multiforme) (HCC) [C71.9]   Emslee A Brackin presents today with encouraging clinical improvement after hospitalization for acute saddle PE, thrombectomy and IVC filter.  She is tolerating Eliquis well without complication.    Improvement in functional status may also be attributed to corticosteroids, currently at 95m daily.  We counseled her on sleep hygiene with insomnia secondary to decadron.   We agreed that cycle #2 of Temozolomide will be deferred until we can obtain follow up MRI scan.  Recommend re-imaging in 2 weeks.  Will also suggest cutting dexamethasone dose down to 461mdaily, or  even to 34m71mext week if tolerated.  We ask that Kayci A Samek return to clinic after MRI brain prior to cycle #2.   All questions were answered. The patient knows to call the clinic with any problems, questions or concerns. No barriers to learning were detected.  The total time spent in the encounter was 30 minutes and more than 50% was  on counseling and review of test results   [name], MD Medical Director of Neuro-Oncology [facility] at [place] 12/25/20 2:40 PM

## 2020-12-27 ENCOUNTER — Telehealth (INDEPENDENT_AMBULATORY_CARE_PROVIDER_SITE_OTHER): Payer: 59 | Admitting: Family Medicine

## 2020-12-27 ENCOUNTER — Encounter: Payer: Self-pay | Admitting: Family Medicine

## 2020-12-27 DIAGNOSIS — Z9289 Personal history of other medical treatment: Secondary | ICD-10-CM

## 2020-12-27 DIAGNOSIS — G479 Sleep disorder, unspecified: Secondary | ICD-10-CM

## 2020-12-27 DIAGNOSIS — I2699 Other pulmonary embolism without acute cor pulmonale: Secondary | ICD-10-CM | POA: Diagnosis not present

## 2020-12-27 DIAGNOSIS — J9601 Acute respiratory failure with hypoxia: Secondary | ICD-10-CM

## 2020-12-27 DIAGNOSIS — R2681 Unsteadiness on feet: Secondary | ICD-10-CM

## 2020-12-27 DIAGNOSIS — Z9181 History of falling: Secondary | ICD-10-CM | POA: Diagnosis not present

## 2020-12-27 NOTE — Progress Notes (Unsigned)
VIRTUAL VISIT VIA VIDEO  I connected with Hannah [name] on 12/27/20 at 11:00 AM EST by a video enabled telemedicine application and verified that I am speaking with the correct person using two identifiers. Location patient: Home Location provider: [facility], Office Persons participating in the virtual visit: Patient, Dr. Raoul Pitch and Cyndra Numbers, CMA  I discussed the limitations of evaluation and management by telemedicine and the availability of in person appointments. The patient expressed understanding and agreed to proceed.   Hannah [name] , 10/28/1961, 60 y.o., female MRN: [id number] Patient Care Team    Relationship Specialty Notifications Start End  [name], DO PCP - General Family Medicine  04/29/16   Pa, [facility]  Neurosurgery  02/08/20   Linda Hedges, [place] Physician Obstetrics and Gynecology  08/03/20   [name], MD Consulting Physician Psychiatry  08/03/20     Chief Complaint  Patient presents with  . Hospitalization Follow-up     Subjective:  Hannah [name]  is a 60 y.o. female presents for hospital follow up after recent admission on 12/14/2020 for primary diagnosis acute massive pulmonary embolism.  Patient was discharged on 12/19/2020 to home. Patients discharge summary has been reviewed, as well as all labs/image studies obtained during hospitalization.   Patients hospital course: Patient presented to the emergency room via EMS after she had a syncopal episode.  She was found to be hypoxic and hypotensive, requiring 100% nonrebreather to maintain adequate oxygen levels..  She was admitted to the ICU after CT angio resulted with acute massive saddle pulmonary embolism.  She was started on heparin and IR placed IVC filter and completed a mechanical thrombectomy on 12/15/2020.  Vascular ultrasounds of lower extremities resulted with acute deep vein thrombosis involving the right and left femoral veins.  This  was completed without any complication.  Repeat echocardiogram showed returned to normal LV and RV function, which had prior been abnormal secondary to strain from pulmonary embolism.  Patient was transitioned to Eliquis and recovered well.  She also had a ALT elevation in her liver enzymes, which has resolved by repeat labs after discharge. Since hospital discharge patient reports patient reports she is doing well today.  She still occasionally has times where she feels winded with activity but overall she feels she is improving a great deal.  She is having difficulty sleeping secondary to the steroids required for her history of GBM.  Hannah [name] reports she is eating well.  Denies any bladder or bowel habit changes.  Her energy is returning but slowly.  She has followed up with her neurosurgeon/oncologist.  She reports she is experiencing more gait instability and has fallen once when getting out of bed.  She denies injury or hitting her head when falling.  She reports her feet are numb first thing in the morning and it takes her time to get the sensation to return.  She had been using a rolling walker, but states she has graduated and feels she is getting around okay on her own she just has to take it slow and be cautious.   Vascular ultrasound 12/15/2020: RIGHT:  - Findings consistent with acute deep vein thrombosis involving the right  femoral vein.  - No cystic structure found in the popliteal fossa.    LEFT:  - Findings consistent with acute deep vein thrombosis involving the left  femoral vein.  - No cystic structure found in the popliteal fossa.  12/14/2020 CT  angio chest: IMPRESSION: Partially occlusive saddle emboli with nearly occlusive thrombus within both lungs throughout the segmental and subsegmental arterial branches. CTevidence of right heart strain (RV/LV Ratio = 2.1) consistent with at least submassive (intermediate risk) PE. The presence of right heart strain has been associated  with an increased risk of morbidity and mortality.  Small pericardial effusion  Small left pleural effusion with basilar atelectasis  Recent Labs  Lab 12/25/20 1422  HGB 12.1  HCT 35.2*  WBC 6.8  PLT 249   CMP Latest Ref Rng & Units 12/25/2020 12/17/2020 12/15/2020  Glucose 70 - 99 mg/dL 121(H) 94 124(H)  BUN 6 - 20 mg/dL 19 15 18   Creatinine 0.44 - 1.00 mg/dL 0.83 0.81 0.60  Sodium 135 - 145 mmol/L 140 140 143  Potassium 3.5 - 5.1 mmol/L 4.3 3.6 3.8  Chloride 98 - 111 mmol/L 104 106 113(H)  CO2 22 - 32 mmol/L 26 25 19(L)  Calcium 8.9 - 10.3 mg/dL 9.1 8.3(L) 7.7(L)  Total Protein 6.5 - 8.1 g/dL 6.5 4.8(L) -  Total Bilirubin 0.3 - 1.2 mg/dL 0.3 0.3 -  Alkaline Phos 38 - 126 U/L 56 56 -  AST 15 - 41 U/L 14(L) 27 -  ALT 0 - 44 U/L 22 66(H) -        Depression screen [facility] 2/9 08/02/2020 12/21/2019 04/29/2016  Decreased Interest 0 0 0  Down, Depressed, Hopeless 0 0 0  PHQ - 2 Score 0 0 0    No Known Allergies Social History   Tobacco Use  . Smoking status: Former Smoker    Years: 2.00    Types: Cigarettes    Quit date: 11/05/1979    Years since quitting: 41.1  . Smokeless tobacco: Never Used  . Tobacco comment: 1/2 pack a week  Substance Use Topics  . Alcohol use: Yes    Alcohol/week: 0.0 standard drinks    Comment: 1 glass of wine weekly   Past Medical History:  Diagnosis Date  . Cervical spondylolysis 12/15/2019   Skeleton: Mild cervical spondylosis C6-7. Incidental find.   . Colon polyp   . GBM (glioblastoma multiforme) ([place])   . Left ankle sprain 10/07/2011  . Seizure ([place])    partial   . Tick bite 02/2020   Past Surgical History:  Procedure Laterality Date  . APPLICATION OF CRANIAL NAVIGATION Left 02/16/2020   Procedure: APPLICATION OF CRANIAL NAVIGATION;  Surgeon: Vallarie Mare, MD;  Location: [facility];  Service: Neurosurgery;  Laterality: Left;  . CRANIOTOMY Left 02/16/2020   Procedure: LEFT FRONTAL CRANIOTOMY FOR BRAIN TUMOR;  Surgeon: Vallarie Mare, MD;  Location: [facility];  Service: Neurosurgery;  Laterality: Left;  . FOOT SURGERY Left    bone spur - local anesthesia  . IR ANGIOGRAM PULMONARY BILATERAL SELECTIVE  12/15/2020  . IR ANGIOGRAM SELECTIVE EACH ADDITIONAL VESSEL  12/15/2020  . IR ANGIOGRAM SELECTIVE EACH ADDITIONAL VESSEL  12/15/2020  . IR IVC FILTER PLMT / S&I /IMG GUID/MOD SED  12/15/2020  . IR THROMBECT PRIM MECH INIT (INCLU) MOD SED  12/15/2020  . IR THROMBECT PRIM MECH INIT (INCLU) MOD SED  12/15/2020  . RADIOLOGY WITH ANESTHESIA N/A 12/15/2020   Procedure: IR WITH ANESTHESIA;  Surgeon: Radiologist, Medication, MD;  Location: [facility];  Service: Radiology;  Laterality: N/A;  . TONSILECTOMY/ADENOIDECTOMY WITH MYRINGOTOMY     tonsils and adenoids out only  . WISDOM TOOTH EXTRACTION     Family History  Problem Relation Age of Onset  . Hypertension  Mother   . Stroke Mother   . Diabetes Mother   . Colon cancer Father 70  . Hypertension Sister   . Colon polyps Sister   . Heart disease Brother 60  . Diabetes Maternal Uncle   . Diabetes Maternal Uncle    Allergies as of 12/27/2020   No Known Allergies     Medication List       Accurate as of December 27, 2020 11:15 AM. If you have any questions, ask your nurse or doctor.        STOP taking these medications   cyclobenzaprine 10 MG tablet Commonly known as: FLEXERIL Stopped by: Howard Pouch, DO   HYDROcodone-acetaminophen 5-325 MG tablet Commonly known as: NORCO/VICODIN Stopped by: Howard Pouch, DO     TAKE these medications   apixaban 5 MG Tabs tablet Commonly known as: ELIQUIS Take 1 tablet (5 mg total) by mouth 2 (two) times daily. What changed: Another medication with the same name was removed. Continue taking this medication, and follow the directions you see here. Changed by: Howard Pouch, DO   cholecalciferol 25 MCG (1000 UNIT) tablet Commonly known as: VITAMIN D3 Take 1,000 Units by mouth daily.   dexamethasone 1 MG tablet Commonly known  as: DECADRON Take 8 tablets (8 mg total) by mouth daily.   docusate sodium 100 MG capsule Commonly known as: COLACE Take 100 mg by mouth 2 (two) times daily as needed for mild constipation.   levETIRAcetam 500 MG tablet Commonly known as: KEPPRA Take 1 tablet (500 mg total) by mouth 2 (two) times daily.   LORazepam 0.5 MG tablet Commonly known as: ATIVAN Place 1 tablet (0.5 mg total) under the tongue every 6 (six) hours as needed for anxiety.   ondansetron 8 MG tablet Commonly known as: Zofran Take 1 tablet (8 mg total) by mouth 2 (two) times daily as needed (nausea and vomiting). May take 30-60 minutes prior to Temodar administration if nausea/vomiting occurs.   temozolomide 20 MG capsule Commonly known as: TEMODAR Take 1 capsule (20 mg total) by mouth daily. May take on an empty stomach to decrease nausea & vomiting.   temozolomide 100 MG capsule Commonly known as: TEMODAR Take 1 capsule (100 mg total) by mouth daily. May take on an empty stomach to decrease nausea & vomiting.       All past medical history, surgical history, allergies, family history, immunizations and medications were updated in the EMR today and reviewed under the history and medication portions of their EMR.      ROS: Negative, with the exception of above mentioned in HPI   Objective:  LMP 06/30/2015  There is no height or weight on file to calculate BMI. Gen: No acute distress. Nontoxic in appearance, well developed, well nourished.  HENT: AT. [place]. YNW:GNFAOZ Equal Round Reactive to light, Extraocular movements intact,  Conjunctiva without redness, discharge or icterus. Chest: no cough or hoarseness, no shortness of breath. Able to speak in full sentences without being winded or pausing.   Neuro:  PERLA. EOMi. Alert. Oriented x3  Psych: Normal affect, dress and demeanor. Normal speech. Normal thought content and judgment.  Assessment/Plan: Hannah [name] is a 60 y.o. female present for OV for  Hospital discharge follow up History of recent hospitalization/Acute massive pulmonary embolism (HCC)/Acute hypoxemic respiratory failure (HCC)/chronic anticoagulation Continue follow-ups with Dr. Mickeal Skinner who will instruct patient on anticoagulation.  She is aware she will need to be on Eliquis 5 mg twice daily for at least 3  months, possibly and most likely longer given the clot burden in her GBM. She is aware IVC filter may be removed at some point, the timeline on is also dependent upon Dr. Mingo Amber recommendations. She has refills on Eliquis by hospitalist to last 3 months.  Would be happy to refill for her if needed.  Gait instability:  I voiced concern over her gait instability and her high risk for falls on a anticoagulant.  Recommended she consider neuro PT again.  She declined referral for now.  She is aware if she changes her mind I had be happy to place this referral for her.  Sleep disturbance: We discussed her sleep disturbance.  We could consider a mild sleep aid or even low-dose Vistaril to help with sleep.  If she is interested she will asked Dr. Mickeal Skinner for his recommendations and if okay to take.  Currently she would like to see how the lower dose steroids do first.  High risk of injury related to a fall/high fall risk: Discussed neuro rehab with her today. She is unsteady in her gait and had falls, being that she is now on a blood thinner certainly places her at higher risk of injury with a fall.  She declined referral to neuro rehab for now. She understands if changes her mind we would be happy to place referral to her.    Reviewed expectations re: course of current medical issues.  Discussed self-management of symptoms.  Outlined signs and symptoms indicating need for more acute intervention.  Patient verbalized understanding and all questions were answered.  Patient received an After-Visit Summary.  Any changes in medications were reviewed and patient was provided with  updated med list with their AVS.      No orders of the defined types were placed in this encounter.    Note is dictated utilizing voice recognition software. Although note has been proof read prior to signing, occasional typographical errors still can be missed. If any questions arise, please do not hesitate to call for verification.   electronically signed by:  Howard Pouch, DO  [place]

## 2020-12-27 NOTE — Patient Instructions (Addendum)
Great to see you today.  If you decide you would like to try a sleep aide and Dr. Mickeal Skinner approves, we can provide for you.  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 I would be happy to place this referral for you.   If you need refills on eliquis do not hesitate to call us, but it looks like they refilled eliquis for 3 months.    Fall Prevention in the Home, Adult Falls can cause injuries and can happen to people of all ages. There are many things you can do to make your home safe and to help prevent falls. Ask for help when making these changes. What actions can I take to prevent falls? General Instructions  Use good lighting in all rooms. Replace any light bulbs that burn out.  Turn on the lights in dark areas. Use night-lights.  Keep items that you use often in easy-to-reach places. Lower the shelves around your home if needed.  Set up your furniture so you have a clear path. Avoid moving your furniture around.  Do not have throw rugs or other things on the floor that can make you trip.  Avoid walking on wet floors.  If any of your floors are uneven, fix them.  Add color or contrast paint or tape to clearly mark and help you see: ? Grab bars or handrails. ? First and last steps of staircases. ? Where the edge of each step is.  If you use a stepladder: ? Make sure that it is fully opened. Do not climb a closed stepladder. ? Make sure the sides of the stepladder are locked in place. ? Ask someone to hold the stepladder while you use it.  Know where your pets are when moving through your home. What can I do in the bathroom?  Keep the floor dry. Clean up any water on the floor right away.  Remove soap buildup in the tub or shower.  Use nonskid mats or decals on the floor of the tub or shower.  Attach bath mats securely with double-sided, nonslip rug tape.  If you need to sit down in the shower, use a plastic, nonslip stool.  Install grab bars by the  toilet and in the tub and shower. Do not use towel bars as grab bars.      What can I do in the bedroom?  Make sure that you have a light by your bed that is easy to reach.  Do not use any sheets or blankets for your bed that hang to the floor.  Have a firm chair with side arms that you can use for support when you get dressed. What can I do in the kitchen?  Clean up any spills right away.  If you need to reach something above you, use a step stool with a grab bar.  Keep electrical cords out of the way.  Do not use floor polish or wax that makes floors slippery. What can I do with my stairs?  Do not leave any items on the stairs.  Make sure that you have a light switch at the top and the bottom of the stairs.  Make sure that there are handrails on both sides of the stairs. Fix handrails that are broken or loose.  Install nonslip stair treads on all your stairs.  Avoid having throw rugs at the top or bottom of the stairs.  Choose a carpet that does not hide the edge of the steps on the stairs.  Check carpeting to make sure that it is firmly attached to the stairs. Fix carpet that is loose or worn. What can I do on the outside of my home?  Use bright outdoor lighting.  Fix the edges of walkways and driveways and fix any cracks.  Remove anything that might make you trip as you walk through a door, such as a raised step or threshold.  Trim any bushes or trees on paths to your home.  Check to see if handrails are loose or broken and that both sides of all steps have handrails.  Install guardrails along the edges of any raised decks and porches.  Clear paths of anything that can make you trip, such as tools or rocks.  Have leaves, snow, or ice cleared regularly.  Use sand or salt on paths during winter.  Clean up any spills in your garage right away. This includes grease or oil spills. What other actions can I take?  Wear shoes that: ? Have a low heel. Do not wear  high heels. ? Have rubber bottoms. ? Feel good on your feet and fit well. ? Are closed at the toe. Do not wear open-toe sandals.  Use tools that help you move around if needed. These include: ? Canes. ? Walkers. ? Scooters. ? Crutches.  Review your medicines with your doctor. Some medicines can make you feel dizzy. This can increase your chance of falling. Ask your doctor what else you can do to help prevent falls. Where to find more information  Centers for Disease Control and Prevention, [employer]: http://www.wolf.info/  National Institute on Aging: http://kim-miller.com/ Contact a doctor if:  You are afraid of falling at home.  You feel weak, drowsy, or dizzy at home.  You fall at home. Summary  There are many simple things that you can do to make your home safe and to help prevent falls.  Ways to make your home safe include removing things that can make you trip and installing grab bars in the bathroom.  Ask for help when making these changes in your home. This information is not intended to replace advice given to you by your health care provider. Make sure you discuss any questions you have with your health care provider. Document Revised: 05/24/2020 Document Reviewed: 05/24/2020 Elsevier Patient Education  [place].

## 2020-12-28 ENCOUNTER — Telehealth (HOSPITAL_COMMUNITY): Payer: Self-pay

## 2020-12-28 DIAGNOSIS — R2681 Unsteadiness on feet: Secondary | ICD-10-CM | POA: Insufficient documentation

## 2020-12-28 DIAGNOSIS — G479 Sleep disorder, unspecified: Secondary | ICD-10-CM | POA: Insufficient documentation

## 2020-12-28 NOTE — Telephone Encounter (Signed)
Pharmacy Transitions of Care Follow-up Telephone Call  Date of discharge: 12/18/20 Discharge Diagnosis: PE  How have you been since you were released from the hospital? Patient has been well. Knows s/sx of bleeding.  Medication changes made at discharge: yes  Medication changes obtained and verified? yes    Medication Accessibility:  Home Pharmacy: Uses [facility]    Was the patient provided with refills on discharged medications? yes  Have all prescriptions been transferred from [facility] to home pharmacy? yes  . Is the patient able to afford medications? yes    Medication Review:  APIXABEN (ELIQUIS)  Apixaban 10 mg BID initiated on 12/18/20. Will switch to apixaban 5 mg BID after 7 days (DATE 12/25/20).  - Discussed importance of taking medication around the same time everyday  - Advised patient of medications to avoid (NSAIDs, ASA)  - Educated that Tylenol (acetaminophen) will be the preferred analgesic to prevent risk of bleeding  - Emphasized importance of monitoring for signs and symptoms of bleeding (abnormal bruising, prolonged bleeding, nose bleeds, bleeding from gums, discolored urine, black tarry stools)  - Advised patient to alert all providers of anticoagulation therapy prior to starting a new medication or having a procedure   Follow-up Appointments:  Had follow up with family medicine on 12/27/20 and another follow up with oncology with Dr. Mickeal Skinner .   If their condition worsens, is the pt aware to call PCP or go to the Emergency Dept.? yes  Final Patient Assessment: Patient is doing well. Being followed oncology. Has follow ups at home pharmacy

## 2021-01-01 ENCOUNTER — Other Ambulatory Visit: Payer: Self-pay | Admitting: Radiation Therapy

## 2021-01-01 DIAGNOSIS — C719 Malignant neoplasm of brain, unspecified: Secondary | ICD-10-CM | POA: Diagnosis not present

## 2021-01-04 ENCOUNTER — Telehealth: Payer: Self-pay | Admitting: *Deleted

## 2021-01-04 NOTE — Telephone Encounter (Addendum)
Consulted Dr. Mickeal Skinner on form attached to 01/03/2021 portal message.  Due to recent hospitalization, patient asked if able to complete form for pick-up next scheduled visit, Monday 01/08/2021.    This nurse instructed provider does not complete hospital benefit claim forms.    Notified Chelesa A Belgard of above; she expresses nurse provided incorrect information.   Further explanation provided by this nurse with call.  This nurse asked provider upon review of [place] form reads "Attending Physician or Provider of Service Statement"   Be signed by attending or service provider since no hospital services noted per this provider in EMR to certify claim.  Requires hospital treatment, tests and surgery service codes, specific to hospital encounter.     "I will speak with him Monday about what I signed out for at discharge.   1. Hospitalized 12/15/2020 through 12/19/2020, Thrombectomy 12/15/2020 and Eliquis 5 mg by Dr. 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       .   2. Instructed to follow up solely with Dr. Mickeal Skinner. 3. Dr. Mickeal Skinner will order IVC filter removal for IR.   4. Do not have a pulmonologist."   Printed attachment included only page 6 and 7 of form remain in queue pending provider instructions, advice or orders.  Aware [facility] ask to allow seven to ten business days (fourteen calendar) for processing forms if needed.

## 2021-01-05 ENCOUNTER — Other Ambulatory Visit: Payer: Self-pay

## 2021-01-05 ENCOUNTER — Ambulatory Visit
Admission: RE | Admit: 2021-01-05 | Discharge: 2021-01-05 | Disposition: A | Discharge status: Home / Self Care | Payer: 59 | Source: Ambulatory Visit | Attending: Internal Medicine | Admitting: Internal Medicine

## 2021-01-05 DIAGNOSIS — C719 Malignant neoplasm of brain, unspecified: Secondary | ICD-10-CM | POA: Diagnosis not present

## 2021-01-05 MED ORDER — GADOBENATE DIMEGLUMINE 529 MG/ML IV SOLN
12.0000 mL | Freq: Once | INTRAVENOUS | Status: AC | PRN
  Administered 2021-01-05: 12 mL via INTRAVENOUS

## 2021-01-08 ENCOUNTER — Other Ambulatory Visit: Payer: Self-pay

## 2021-01-08 ENCOUNTER — Inpatient Hospital Stay: Payer: 59

## 2021-01-08 ENCOUNTER — Inpatient Hospital Stay: Payer: 59 | Attending: Internal Medicine | Admitting: Internal Medicine

## 2021-01-08 ENCOUNTER — Other Ambulatory Visit: Payer: Self-pay | Admitting: Internal Medicine

## 2021-01-08 ENCOUNTER — Ambulatory Visit (HOSPITAL_COMMUNITY)
Admission: RE | Admit: 2021-01-08 | Discharge: 2021-01-08 | Disposition: A | Discharge status: Home / Self Care | Payer: 59 | Source: Ambulatory Visit | Attending: Internal Medicine | Admitting: Internal Medicine

## 2021-01-08 VITALS — BP 104/73 | HR 68 | Temp 97.6°F | Resp 17 | Ht 64.0 in | Wt 136.1 lb

## 2021-01-08 DIAGNOSIS — R0602 Shortness of breath: Secondary | ICD-10-CM

## 2021-01-08 DIAGNOSIS — Z86718 Personal history of other venous thrombosis and embolism: Secondary | ICD-10-CM | POA: Insufficient documentation

## 2021-01-08 DIAGNOSIS — C711 Malignant neoplasm of frontal lobe: Secondary | ICD-10-CM | POA: Insufficient documentation

## 2021-01-08 DIAGNOSIS — Z7952 Long term (current) use of systemic steroids: Secondary | ICD-10-CM | POA: Diagnosis not present

## 2021-01-08 DIAGNOSIS — I2692 Saddle embolus of pulmonary artery without acute cor pulmonale: Secondary | ICD-10-CM | POA: Insufficient documentation

## 2021-01-08 DIAGNOSIS — I2782 Chronic pulmonary embolism: Secondary | ICD-10-CM

## 2021-01-08 DIAGNOSIS — Z86711 Personal history of pulmonary embolism: Secondary | ICD-10-CM | POA: Insufficient documentation

## 2021-01-08 DIAGNOSIS — G40909 Epilepsy, unspecified, not intractable, without status epilepticus: Secondary | ICD-10-CM

## 2021-01-08 DIAGNOSIS — Z8249 Family history of ischemic heart disease and other diseases of the circulatory system: Secondary | ICD-10-CM | POA: Diagnosis not present

## 2021-01-08 DIAGNOSIS — Z87891 Personal history of nicotine dependence: Secondary | ICD-10-CM | POA: Insufficient documentation

## 2021-01-08 DIAGNOSIS — C719 Malignant neoplasm of brain, unspecified: Secondary | ICD-10-CM | POA: Diagnosis not present

## 2021-01-08 DIAGNOSIS — Z833 Family history of diabetes mellitus: Secondary | ICD-10-CM | POA: Insufficient documentation

## 2021-01-08 DIAGNOSIS — Z923 Personal history of irradiation: Secondary | ICD-10-CM | POA: Insufficient documentation

## 2021-01-08 DIAGNOSIS — Z9221 Personal history of antineoplastic chemotherapy: Secondary | ICD-10-CM | POA: Insufficient documentation

## 2021-01-08 DIAGNOSIS — Z79899 Other long term (current) drug therapy: Secondary | ICD-10-CM | POA: Insufficient documentation

## 2021-01-08 DIAGNOSIS — Z7901 Long term (current) use of anticoagulants: Secondary | ICD-10-CM | POA: Diagnosis not present

## 2021-01-08 DIAGNOSIS — R0600 Dyspnea, unspecified: Secondary | ICD-10-CM | POA: Diagnosis not present

## 2021-01-08 MED ORDER — LEVETIRACETAM 500 MG PO TABS: 500.0000 mg | ORAL_TABLET | Freq: Two times a day (BID) | ORAL | 0 refills | Status: DC

## 2021-01-08 MED ORDER — LAMOTRIGINE 25 MG PO TABS: 50.0000 mg | ORAL_TABLET | Freq: Every day | ORAL | 0 refills | Status: DC

## 2021-01-08 MED ORDER — LAMOTRIGINE 100 MG PO TABS: 100.0000 mg | ORAL_TABLET | Freq: Every day | ORAL | 3 refills | Status: DC

## 2021-01-08 MED ORDER — DEXAMETHASONE 1 MG PO TABS: 4.0000 mg | ORAL_TABLET | Freq: Every day | ORAL | 1 refills | Status: DC

## 2021-01-08 MED FILL — levETIRAcetam 500 MG TABS: 500 | 30 days supply | Qty: 60 | Fill #0

## 2021-01-08 MED FILL — DEXAMETHASONE 1 MG TABLET: 1 | 30 days supply | Qty: 120 | Fill #0

## 2021-01-08 MED FILL — LAMOTRIGINE 25 MG TABS: 25 | 7 days supply | Qty: 14 | Fill #0

## 2021-01-08 MED FILL — LAMOTRIGINE 100 MG TABS: 100 | 30 days supply | Qty: 30 | Fill #0

## 2021-01-08 NOTE — Progress Notes (Signed)
[facility] [place], [place] [address] [phone]   Interval Evaluation  Date of Service: 01/08/21 Patient Name: Hannah [name] Patient MRN: [id number] Patient DOB: [birth year] Provider: [name], MD  Identifying Statement:  Hannah [name] is a 60 y.o. female with left frontal glioblastoma   Oncologic History: Oncology History  GBM (glioblastoma multiforme) ([place])   Initial Diagnosis   GBM (glioblastoma multiforme) ([place])   02/17/2020 Surgery   Craniotomy, left frontal resection by Dr. Marcello Moores.  Path is glioblastoma.   08/28/2020 - 10/09/2020 Radiation Therapy   Radiation and concurrent Temozolomide 526m/m2 daily   11/10/2020 -  Chemotherapy   Initiates cycle #1 5-day Temozolomide      [facility]   Admitted for saddle PE, undergoes thrombectomy and IVC filter placement     Biomarkers:  MGMT Methylated.  IDH 1/2 Wild type.  EGFR Unknown  TERT Unknown   Interval History:  Hannah [name] presents today for follow up after recent MRI brain.  She describes no significant changes in functional status. She is now independent with ADLs.  Decadron currently dosed at 421mdaily, decrease from prior.  She does continue to describe ongoing shortness of breath today.  Decadron 11/09/20: 26m15m2/10/22: 8mg83m/21/22: 6mg 85m07/22: 4mg  44m (01/20/20) Patient presented one month ago with sudden onset left arm and leg weakness and interrupted speech, [name], [name], stroke eval and CNS imaging which demonstrated a non-enhancing left frontal mass.  At this time she is back to baseline without recurrence of events, taking Keppra 500mg t45m per day.  No history or seizure or any other neurologic events.  She does describe being sleep deprived prior to seizure event.  She presents  today after one month follow up MRI scan.  Medications: Current Outpatient Medications on File Prior to Visit  Medication Sig Dispense Refill  . apixaban (ELIQUIS) 5 MG TABS tablet Take 1 tablet (5 mg total) by mouth 2 (two) times daily. 60 tablet 3  . cholecalciferol (VITAMIN D3) 25 MCG (1000 UNIT) tablet Take 1,000 Units by mouth daily.    . dexamMarland Kitchenthasone (DECADRON) 1 MG tablet Take 8 tablets (8 mg total) by mouth daily.    . docusMarland Kitchente sodium (COLACE) 100 MG capsule Take 100 mg by mouth 2 (two) times daily as needed for mild constipation.    . levETIRAcetam (KEPPRA) 500 MG tablet Take 1 tablet (500 mg total) by mouth 2 (two) times daily. 60 tablet 3  . LORazepam (ATIVAN) 0.5 MG tablet Place 1 tablet (0.5 mg total) under the tongue every 6 (six) hours as needed for anxiety. (Patient not taking: No sig reported) 45 tablet 0  . ondansetron (ZOFRAN) 8 MG tablet Take 1 tablet (8 mg total) by mouth 2 (two) times daily as needed (nausea and vomiting). May take 30-60 minutes prior to Temodar administration if nausea/vomiting occurs. (Patient not taking: No sig reported) 30 tablet 1  . temozolomide (TEMODAR) 100 MG capsule Take 1 capsule (100 mg total) by mouth daily. May take on an empty stomach to decrease nausea & vomiting. 42 capsule 0  . temozolomide (TEMODAR) 20 MG capsule Take 1 capsule (20 mg total) by mouth daily. May take on an empty stomach to decrease nausea & vomiting. 42 capsule 0   No current  facility-administered medications on file prior to visit.    Allergies: No Known Allergies Past Medical History:  Past Medical History:  Diagnosis Date  . Cervical spondylolysis 12/15/2019   Skeleton: Mild cervical spondylosis C6-7. Incidental find.   . Colon polyp   . GBM (glioblastoma multiforme) ([place])   . Left ankle sprain 10/07/2011  . Seizure ([place])    partial   . Tick bite 02/2020   Past Surgical History:  Past Surgical History:  Procedure Laterality Date  . APPLICATION OF CRANIAL  NAVIGATION Left 02/16/2020   Procedure: APPLICATION OF CRANIAL NAVIGATION;  Surgeon: Vallarie Mare, MD;  Location: [facility];  Service: Neurosurgery;  Laterality: Left;  . CRANIOTOMY Left 02/16/2020   Procedure: LEFT FRONTAL CRANIOTOMY FOR BRAIN TUMOR;  Surgeon: Vallarie Mare, MD;  Location: [facility];  Service: Neurosurgery;  Laterality: Left;  . FOOT SURGERY Left    bone spur - local anesthesia  . IR ANGIOGRAM PULMONARY BILATERAL SELECTIVE  12/15/2020  . IR ANGIOGRAM SELECTIVE EACH ADDITIONAL VESSEL  12/15/2020  . IR ANGIOGRAM SELECTIVE EACH ADDITIONAL VESSEL  12/15/2020  . IR IVC FILTER PLMT / S&I /IMG GUID/MOD SED  12/15/2020  . IR THROMBECT PRIM MECH INIT (INCLU) MOD SED  12/15/2020  . IR THROMBECT PRIM MECH INIT (INCLU) MOD SED  12/15/2020  . RADIOLOGY WITH ANESTHESIA N/A 12/15/2020   Procedure: IR WITH ANESTHESIA;  Surgeon: Radiologist, Medication, MD;  Location: [facility];  Service: Radiology;  Laterality: N/A;  . TONSILECTOMY/ADENOIDECTOMY WITH MYRINGOTOMY     tonsils and adenoids out only  . WISDOM TOOTH EXTRACTION     Social History:  Social History   Socioeconomic History  . Marital status: Married    Spouse name: Not on file  . Number of children: 3  . Years of education: Not on file  . Highest education level: Not on file  Occupational History  . Not on file  Tobacco Use  . Smoking status: Former Smoker    Years: 2.00    Types: Cigarettes    Quit date: 11/05/1979    Years since quitting: 41.2  . Smokeless tobacco: Never Used  . Tobacco comment: 1/2 pack a week  Vaping Use  . Vaping Use: Never used  Substance and Sexual Activity  . Alcohol use: Yes    Alcohol/week: 0.0 standard drinks    Comment: 1 glass of wine weekly  . Drug use: No  . Sexual activity: Yes    Birth control/protection: Surgical  Other Topics Concern  . Not on file  Social History Narrative  . Not on file   Social Determinants of Health   Financial Resource Strain: Not on file  Food Insecurity:  Not on file  Transportation Needs: Not on file  Physical Activity: Not on file  Stress: Not on file  Social Connections: Not on file  Intimate Partner Violence: Not on file   Family History:  Family History  Problem Relation Age of Onset  . Hypertension Mother   . Stroke Mother   . Diabetes Mother   . Colon cancer Father 22  . Hypertension Sister   . Colon polyps Sister   . Heart disease Brother 33  . Diabetes Maternal Uncle   . Diabetes Maternal Uncle     Review of Systems: Constitutional: Doesn't report fevers, chills or abnormal weight loss Eyes: Doesn't report blurriness of vision Ears, nose, mouth, throat, and face: Doesn't report sore throat Respiratory: Chest wall pain Cardiovascular: Doesn't report palpitation, chest discomfort  Gastrointestinal:  Doesn't report nausea, constipation, diarrhea GU: Doesn't report incontinence Skin: Doesn't report skin rashes Neurological: Per HPI Musculoskeletal: Doesn't report joint pain Behavioral/Psych: Doesn't report anxiety  Physical Exam: Vitals:   01/08/21 1031  BP: 104/73  Pulse: 68  Resp: 17  Temp: 97.6 F (36.4 C)  SpO2: 100%   KPS: 60. General: Alert, cooperative, pleasant, in no acute distress Head: Normal EENT: No conjunctival injection or scleral icterus.  Lungs: Resp effort normal Cardiac: Regular rate Abdomen: Non-distended abdomen Skin: No rashes cyanosis or petechiae. Extremities: No clubbing or edema  Neurologic Exam: Mental Status: Awake, alert, attentive to examiner. Oriented to self and environment. Language is fluent with intact comprehension.  Occasional interruptions in fluency.  Cranial Nerves: Visual acuity is grossly normal. Visual fields are full. Extra-ocular movements intact. No ptosis. Face is symmetric Motor: Tone and bulk are normal. Full power in arms and legs. Reflexes are symmetric, no pathologic reflexes present.  Sensory: Intact to light touch Gait: Independent but  dystaxic  Labs: I have reviewed the data as listed    Component Value Date/Time   NA 140 12/25/2020 1422   K 4.3 12/25/2020 1422   CL 104 12/25/2020 1422   CO2 26 12/25/2020 1422   GLUCOSE 121 (H) 12/25/2020 1422   GLUCOSE 85 10/13/2006 1158   BUN 19 12/25/2020 1422   CREATININE 0.83 12/25/2020 1422   CALCIUM 9.1 12/25/2020 1422   PROT 6.5 12/25/2020 1422   ALBUMIN 3.8 12/25/2020 1422   AST 14 (L) 12/25/2020 1422   ALT 22 12/25/2020 1422   ALKPHOS 56 12/25/2020 1422   BILITOT 0.3 12/25/2020 1422   GFRNONAA >60 12/25/2020 1422   GFRAA >60 02/17/2020 0136   Lab Results  Component Value Date   WBC 6.8 12/25/2020   NEUTROABS 6.1 12/25/2020   HGB 12.1 12/25/2020   HCT 35.2 (L) 12/25/2020   MCV 96.7 12/25/2020   PLT 249 12/25/2020   Imaging:  [facility] Clinician Interpretation: I have personally reviewed the CNS images as listed.  My interpretation, in the context of the patient's clinical presentation, is treatment effect vs true progression  DG Chest 1 View  Result Date: 01/08/2021 CLINICAL DATA:  Dyspnea, history of pulmonary embolism discovered on February of 2022 EXAM: CHEST  1 VIEW COMPARISON:  CT of the chest of 12/14/2020 and chest x-ray of December 07, 2020 FINDINGS: Trachea midline. Cardiomediastinal contours and hilar structures are normal. Lungs are clear. No sign of effusion on frontal radiograph. Upper abdomen with IVC filter in place. On limited assessment no acute skeletal process. Spinal curvature as on previous radiographs. IMPRESSION: No acute cardiopulmonary disease. Electronically Signed   By: Zetta Bills M.D.   On: 01/08/2021 12:15   CT Head Wo Contrast  Result Date: 12/14/2020 CLINICAL DATA:  60 year old female with syncope, seizure, and abnormal neurological exam. Known glioblastoma undergoing chemo and radiation treatment. EXAM: CT HEAD WITHOUT CONTRAST TECHNIQUE: Contiguous axial images were obtained from the base of the skull through the vertex without  intravenous contrast. COMPARISON:  Brain MRI dated 11/02/2020. FINDINGS: Brain: Ill-defined 3.8 x 2.3 cm low attenuating area centered at anterior corpus callosum and crossing the midline correspond to the necrotic mass seen on the prior MRI. There is interval development of moderate amount of white matter edema predominantly involving the frontal lobes, increased since the prior MRI. There is approximately 4 mm right to left midline shift (measured at the level of the foramen Monro). Ill-defined area of higher attenuation involving the left frontal convexity (  23/2) may represent volume averaging from adjacent cortices or additional mass. MRI without and with contrast is recommended for better evaluation of the mass and comparison with prior MRI images. There is no acute intracranial hemorrhage. Vascular: No hyperdense vessel or unexpected calcification. Skull: Left frontal craniotomy.  No acute calvarial pathology. Sinuses/Orbits: No acute finding. Other: None IMPRESSION: 1. Increase in the degree of edema with 4 mm right to left midline shift. MRI without and with contrast may provide better evaluation. 2. No acute intracranial hemorrhage. These results were called by telephone at the time of interpretation on 12/14/2020 at 9:42 pm to provider [facility] , who verbally acknowledged these results. Electronically Signed   By: Anner Crete M.D.   On: 12/14/2020 21:49   CT Angio Chest PE W/Cm &/Or Wo Cm  Result Date: 12/14/2020 CLINICAL DATA:  History of brain tumor shortness of breath EXAM: CT ANGIOGRAPHY CHEST WITH CONTRAST TECHNIQUE: Multidetector CT imaging of the chest was performed using the standard protocol during bolus administration of intravenous contrast. Multiplanar CT image reconstructions and MIPs were obtained to evaluate the vascular anatomy. CONTRAST:  144m OMNIPAQUE IOHEXOL 350 MG/ML SOLN COMPARISON:  None. FINDINGS: Cardiovascular: There is a optimal opacification of the pulmonary  arteries. There is partially occlusive thrombus within the central central bifurcation of the main pulmonary arteries with nearly occlusive thrombus in the bilateral segmental and subsegmental arterial branches throughout both lungs. There is evidence of right ventricular heart strain, RV/LV ratio equals 2.1 there is a small pericardial effusion present. There is normal three-vessel brachiocephalic anatomy without proximal stenosis. Scattered mild aortic atherosclerosis is seen. Mediastinum/Nodes: No hilar, mediastinal, or axillary adenopathy. Thyroid gland, trachea, and esophagus demonstrate no significant findings. Lungs/Pleura: A small left pleural effusion with adjacent patchy airspace opacity at the left lung base, likely atelectasis is noted. Upper Abdomen: No acute abnormalities present in the visualized portions of the upper abdomen. Hree focus of contrast is seen within the IVC, likely consistent with right heart strain. Musculoskeletal: No chest wall abnormality. No acute or significant osseous findings. Review of the MIP images confirms the above findings. IMPRESSION: Partially occlusive saddle emboli with nearly occlusive thrombus within both lungs throughout the segmental and subsegmental arterial branches. CTevidence of right heart strain (RV/LV Ratio = 2.1) consistent with at least submassive (intermediate risk) PE. The presence of right heart strain has been associated with an increased risk of morbidity and mortality. Small pericardial effusion Small left pleural effusion with basilar atelectasis These results were called by telephone at the time of interpretation on 12/14/2020 at 9:42 pm [facility], who verbally acknowledged these results. Electronically Signed   By: BPrudencio PairM.D.   On: 12/14/2020 21:44   MR BRAIN W WO CONTRAST  Result Date: 01/05/2021 CLINICAL DATA:  Follow-up glioblastoma EXAM: MRI HEAD WITHOUT AND WITH CONTRAST TECHNIQUE: Multiplanar, multiecho pulse  sequences of the brain and surrounding structures were obtained without and with intravenous contrast. CONTRAST:  166mMULTIHANCE GADOBENATE DIMEGLUMINE 529 MG/ML IV SOLN COMPARISON:  11/02/2020 MRI.  12/14/2020 CT. FINDINGS: Brain: No abnormality affects the brainstem or cerebellum. 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 Overall, slight increase in tumor measurement from front to back to the right of midline, increased from 3 cm to 3.5 cm. The majority of the other dimensions are unchanged. There is more vasogenic edema within both hemispheres, more extensive  on the right than the left. The nature of the tumor enhancement is change somewhat. Previously, there was fairly confluent solid enhancement around the margins of the tumor. Presently, enhancement pattern is not solid, enhancement being visible along the outer and inner margins but with relative loss of the enhancement in the midportion of the tumor tissue. This would seem more likely to be related to previous treatment rather than uniform internal necrosis of the tumor due to tumor progression. No satellite lesions are seen. Ventricular size is stable. No midline shift. No extra-axial fluid. Vascular: Major vessels at the base of the brain show flow. 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 Increased regional vasogenic edema, right more than left. Change in enhancement pattern that would seem more likely secondary to treatment effect rather than uniform internal necrosis of the enhancing tumor. Electronically Signed   By: Nelson Chimes M.D.   On: 01/05/2021 15:41   IR  Angiogram Pulmonary Bilateral Selective  Result Date: 12/15/2020 INDICATION: 60 year old woman with sub massive pulmonary artery emboli with RV LV ratio of 3.4. Patient has a history of GBM and cannot receive tPA. Prior to the procedure patient was on non-rebreather at 15 L, with oxygen saturation of 93%. Interventional radiology consulted for catheter directed thrombectomy of pulmonary embolism. IVC filter also requested given history of lower extremity DVT and GBM. EXAM: 1. Ultrasound-guided access of right common femoral vein 2. Bilateral pulmonary venogram 3. Bilateral pulmonary suction thrombectomy 4. IVC filter placement COMPARISON:  CT angiography chest 12/14/2020 MEDICATIONS: None. ANESTHESIA/SEDATION: None. The patient was monitored by the anesthesia team throughout the procedure given their tenuous cardiovascular status. FLUOROSCOPY TIME:  Fluoroscopy Time: 53 minutes 42 seconds (132 mGy). COMPLICATIONS: None immediate. TECHNIQUE: Informed written consent was obtained from the patient after a thorough discussion of the procedural risks, benefits and alternatives. All questions were addressed. Maximal Sterile Barrier Technique was utilized including caps, mask, sterile gowns, sterile gloves, sterile drape, hand hygiene and skin antiseptic. A timeout was performed prior to the initiation of the procedure. Right groin skin prepped and draped in usual fashion. The right common femoral vein was evaluated with ultrasound and shown to be patent. A permanent ultrasound image was obtained and placed in the patient's medical record. Using sterile gel and a sterile probe cover the right common femoral vein was entered with a 21 gauge needle during real time ultrasound guidance. 21 gauge needle exchanged for transitional dilator set over 0.018 in guidewire. Transitional dilator set exchanged for 6 French sheath over 0.035 in guidewire. Despite multiple attempts with single and double angle pigtail catheter, the  pulmonary artery could not be accessed. The short 6 French sheath was exchanged for a 90 cm 7 [place] sheath. The tip of the sheath position just below the cavoatrial junction. The long 7 French sheath allowed for additional stability and the main pulmonary artery was accessed with a 6 [place] double angle pigtail catheter. Pre thrombectomy pulmonary artery pressure: 54/31 mmHg (mean 41 mmHg) Double angled pigtail catheter removed over exchange length Amplatz guidewire. Serial dilation was performed and 7 [place] sheath exchanged for [employer] 24 French sheath. Utilizing a Bern catheter, the guidewire was directed into a right lower lobe pulmonary artery Enisa Runyan. [place]  was advanced over the wire into the lateral basilar segmental and Truncus Anterior branches of the right pulmonary arterial system. Mechanical aspiration thrombectomy was performed extirpating thrombus. Post thrombectomy pulmonary angiogram demonstrated significant reduction in overall clot burden in the right pulmonary arterial branches. The left lateral basilar segmental Erik Nessel of the lower lobe artery was selected with the burn catheter. The 20 [place] FlowTriver device was advanced over the wire into the lateral basilar segmental branches of the left pulmonary arterial system. No significant thrombus could be removed with the 20 [place] device. The 20 French device was removed and the 24 [place] FlowTriver device was inserted. The tip was advanced into the main left pulmonary artery. The T20 FlowTriver device was then inserted through the 24 French device into the lateral basilar segment, and mechanical aspiration thrombectomy was performed extirpating thrombus. Post aspiration pulmonary angiogram demonstrated significant reduction in overall clot burden. Prior thrombectomy, the patient's blood pressure measured 87/67 mm Hg with a heart rate of 107 beats per minute. Post thrombectomy, pressures improved to 117/64 mm Hg  with heart rate of 81 beats per minute. Patient's oxygenation improved from 93% on 15 L by non-rebreather 200% on 10 L by non-rebreather. FlowTriever device was removed. Bern catheter was advanced through the cord dry seal to the infrarenal inferior vena cava and venogram was performed in order to delineate the origin of the renal veins. Bern catheter was removed over a guidewire and Denali IVC filter was deployed in the infrarenal vena cava. The [place] Dry Seal sheath was removed and hemostasis achieved with purse-string suture and manual compression. IMPRESSION: 1. Bilateral pulmonary angiogram and Inari sunction thrombectomy as above. 2. Denali IVC filter placed. Electronically Signed   By: Miachel Roux M.D.   On: 12/15/2020 18:38   IR Angiogram Selective Each Additional Vessel  Result Date: 12/15/2020 INDICATION: 60 year old woman with sub massive pulmonary artery emboli with RV LV ratio of 3.4. Patient has a history of GBM and cannot receive tPA. Prior to the procedure patient was on non-rebreather at 15 L, with oxygen saturation of 93%. Interventional radiology consulted for catheter directed thrombectomy of pulmonary embolism. IVC filter also requested given history of lower extremity DVT and GBM. EXAM: 1. Ultrasound-guided access of right common femoral vein 2. Bilateral pulmonary venogram 3. Bilateral pulmonary suction thrombectomy 4. IVC filter placement COMPARISON:  CT angiography chest 12/14/2020 MEDICATIONS: None. ANESTHESIA/SEDATION: None. The patient was monitored by the anesthesia team throughout the procedure given their tenuous cardiovascular status. FLUOROSCOPY TIME:  Fluoroscopy Time: 53 minutes 42 seconds (132 mGy). COMPLICATIONS: None immediate. TECHNIQUE: Informed written consent was obtained from the patient after a thorough discussion of the procedural risks, benefits and alternatives. All questions were addressed. Maximal Sterile Barrier Technique was utilized including caps, mask,  sterile gowns, sterile gloves, sterile drape, hand hygiene and skin antiseptic. A timeout was performed prior to the initiation of the procedure. Right groin skin prepped and draped in usual fashion. The right common femoral vein was evaluated with ultrasound and shown to be patent. A permanent ultrasound image was obtained and placed in the patient's medical record. Using sterile gel and a sterile probe cover the right common femoral vein was entered with a 21 gauge needle during real time ultrasound guidance. 21 gauge needle exchanged for transitional dilator set over 0.018 in guidewire. Transitional dilator set exchanged for 6 French sheath over 0.035 in guidewire. Despite multiple attempts with single and double angle pigtail catheter, the pulmonary artery could not be accessed. The short  6 French sheath was exchanged for a 90 cm 7 [place] sheath. The tip of the sheath position just below the cavoatrial junction. The long 7 French sheath allowed for additional stability and the main pulmonary artery was accessed with a 6 [place] double angle pigtail catheter. Pre thrombectomy pulmonary artery pressure: 54/31 mmHg (mean 41 mmHg) Double angled pigtail catheter removed over exchange length Amplatz guidewire. Serial dilation was performed and 7 [place] sheath exchanged for [employer] 24 French sheath. Utilizing a Bern catheter, the guidewire was directed into a right lower lobe pulmonary artery Cody Oliger. 76 [place] FlowTriver device was advanced over the wire into the lateral basilar segmental and Truncus Anterior branches of the right pulmonary arterial system. Mechanical aspiration thrombectomy was performed extirpating thrombus. Post thrombectomy pulmonary angiogram demonstrated significant reduction in overall clot burden in the right pulmonary arterial branches. The left lateral basilar segmental Daran Favaro of the lower lobe artery was selected with the burn catheter. The 20 [place] FlowTriver device was advanced  over the wire into the lateral basilar segmental branches of the left pulmonary arterial system. No significant thrombus could be removed with the 20 [place] device. The 20 French device was removed and the 24 [place] FlowTriver device was inserted. The tip was advanced into the main left pulmonary artery. The T20 FlowTriver device was then inserted through the 24 French device into the lateral basilar segment, and mechanical aspiration thrombectomy was performed extirpating thrombus. Post aspiration pulmonary angiogram demonstrated significant reduction in overall clot burden. Prior thrombectomy, the patient's blood pressure measured 87/67 mm Hg with a heart rate of 107 beats per minute. Post thrombectomy, pressures improved to 117/64 mm Hg with heart rate of 81 beats per minute. Patient's oxygenation improved from 93% on 15 L by non-rebreather 200% on 10 L by non-rebreather. FlowTriever device was removed. Bern catheter was advanced through the cord dry seal to the infrarenal inferior vena cava and venogram was performed in order to delineate the origin of the renal veins. Bern catheter was removed over a guidewire and Denali IVC filter was deployed in the infrarenal vena cava. The [place] Dry Seal sheath was removed and hemostasis achieved with purse-string suture and manual compression. IMPRESSION: 1. Bilateral pulmonary angiogram and Inari sunction thrombectomy as above. 2. Denali IVC filter placed. Electronically Signed   By: Miachel Roux M.D.   On: 12/15/2020 18:38   IR Angiogram Selective Each Additional Vessel  Result Date: 12/15/2020 INDICATION: 60 year old woman with sub massive pulmonary artery emboli with RV LV ratio of 3.4. Patient has a history of GBM and cannot receive tPA. Prior to the procedure patient was on non-rebreather at 15 L, with oxygen saturation of 93%. Interventional radiology consulted for catheter directed thrombectomy of pulmonary embolism. IVC filter also requested given history of  lower extremity DVT and GBM. EXAM: 1. Ultrasound-guided access of right common femoral vein 2. Bilateral pulmonary venogram 3. Bilateral pulmonary suction thrombectomy 4. IVC filter placement COMPARISON:  CT angiography chest 12/14/2020 MEDICATIONS: None. ANESTHESIA/SEDATION: None. The patient was monitored by the anesthesia team throughout the procedure given their tenuous cardiovascular status. FLUOROSCOPY TIME:  Fluoroscopy Time: 53 minutes 42 seconds (132 mGy). COMPLICATIONS: None immediate. TECHNIQUE: Informed written consent was obtained from the patient after a thorough discussion of the procedural risks, benefits and alternatives. All questions were addressed. Maximal Sterile Barrier Technique was utilized including caps, mask, sterile gowns, sterile gloves, sterile drape, hand hygiene and skin antiseptic. A timeout was performed prior to the initiation of the procedure.  Right groin skin prepped and draped in usual fashion. The right common femoral vein was evaluated with ultrasound and shown to be patent. A permanent ultrasound image was obtained and placed in the patient's medical record. Using sterile gel and a sterile probe cover the right common femoral vein was entered with a 21 gauge needle during real time ultrasound guidance. 21 gauge needle exchanged for transitional dilator set over 0.018 in guidewire. Transitional dilator set exchanged for 6 French sheath over 0.035 in guidewire. Despite multiple attempts with single and double angle pigtail catheter, the pulmonary artery could not be accessed. The short 6 French sheath was exchanged for a 90 cm 7 [place] sheath. The tip of the sheath position just below the cavoatrial junction. The long 7 French sheath allowed for additional stability and the main pulmonary artery was accessed with a 6 [place] double angle pigtail catheter. Pre thrombectomy pulmonary artery pressure: 54/31 mmHg (mean 41 mmHg) Double angled pigtail catheter removed over exchange  length Amplatz guidewire. Serial dilation was performed and 7 [place] sheath exchanged for [employer] 24 French sheath. Utilizing a Bern catheter, the guidewire was directed into a right lower lobe pulmonary artery Daymen Hassebrock. 74 [place] FlowTriver device was advanced over the wire into the lateral basilar segmental and Truncus Anterior branches of the right pulmonary arterial system. Mechanical aspiration thrombectomy was performed extirpating thrombus. Post thrombectomy pulmonary angiogram demonstrated significant reduction in overall clot burden in the right pulmonary arterial branches. The left lateral basilar segmental Lyndsi Altic of the lower lobe artery was selected with the burn catheter. The 20 [place] FlowTriver device was advanced over the wire into the lateral basilar segmental branches of the left pulmonary arterial system. No significant thrombus could be removed with the 20 [place] device. The 20 French device was removed and the 24 [place] FlowTriver device was inserted. The tip was advanced into the main left pulmonary artery. The T20 FlowTriver device was then inserted through the 24 French device into the lateral basilar segment, and mechanical aspiration thrombectomy was performed extirpating thrombus. Post aspiration pulmonary angiogram demonstrated significant reduction in overall clot burden. Prior thrombectomy, the patient's blood pressure measured 87/67 mm Hg with a heart rate of 107 beats per minute. Post thrombectomy, pressures improved to 117/64 mm Hg with heart rate of 81 beats per minute. Patient's oxygenation improved from 93% on 15 L by non-rebreather 200% on 10 L by non-rebreather. FlowTriever device was removed. Bern catheter was advanced through the cord dry seal to the infrarenal inferior vena cava and venogram was performed in order to delineate the origin of the renal veins. Bern catheter was removed over a guidewire and Denali IVC filter was deployed in the infrarenal vena cava. The  [place]ore Dry Seal sheath was removed and hemostasis achieved with purse-string suture and manual compression. IMPRESSION: 1. Bilateral pulmonary angiogram and Inari sunction thrombectomy as above. 2. Denali IVC filter placed. Electronically Signed   By: Miachel Roux M.D.   On: 12/15/2020 18:38   IR IVC FILTER PLMT / S&I Burke Keels GUID/MOD SED  Result Date: 12/15/2020 INDICATION: 60 year old woman with sub massive pulmonary artery emboli with RV LV ratio of 3.4. Patient has a history of GBM and cannot receive tPA. Prior to the procedure patient was on non-rebreather at 15 L, with oxygen saturation of 93%. Interventional radiology consulted for catheter directed thrombectomy of pulmonary embolism. IVC filter also requested given history of lower extremity DVT and GBM. EXAM: 1. Ultrasound-guided access of right common femoral vein 2.  Bilateral pulmonary venogram 3. Bilateral pulmonary suction thrombectomy 4. IVC filter placement COMPARISON:  CT angiography chest 12/14/2020 MEDICATIONS: None. ANESTHESIA/SEDATION: None. The patient was monitored by the anesthesia team throughout the procedure given their tenuous cardiovascular status. FLUOROSCOPY TIME:  Fluoroscopy Time: 53 minutes 42 seconds (132 mGy). COMPLICATIONS: None immediate. TECHNIQUE: Informed written consent was obtained from the patient after a thorough discussion of the procedural risks, benefits and alternatives. All questions were addressed. Maximal Sterile Barrier Technique was utilized including caps, mask, sterile gowns, sterile gloves, sterile drape, hand hygiene and skin antiseptic. A timeout was performed prior to the initiation of the procedure. Right groin skin prepped and draped in usual fashion. The right common femoral vein was evaluated with ultrasound and shown to be patent. A permanent ultrasound image was obtained and placed in the patient's medical record. Using sterile gel and a sterile probe cover the right common femoral vein was entered with  a 21 gauge needle during real time ultrasound guidance. 21 gauge needle exchanged for transitional dilator set over 0.018 in guidewire. Transitional dilator set exchanged for 6 French sheath over 0.035 in guidewire. Despite multiple attempts with single and double angle pigtail catheter, the pulmonary artery could not be accessed. The short 6 French sheath was exchanged for a 90 cm 7 [place] sheath. The tip of the sheath position just below the cavoatrial junction. The long 7 French sheath allowed for additional stability and the main pulmonary artery was accessed with a 6 [place] double angle pigtail catheter. Pre thrombectomy pulmonary artery pressure: 54/31 mmHg (mean 41 mmHg) Double angled pigtail catheter removed over exchange length Amplatz guidewire. Serial dilation was performed and 7 [place] sheath exchanged for [employer] 24 French sheath. Utilizing a Bern catheter, the guidewire was directed into a right lower lobe pulmonary artery Zinnia Tindall. 20 [place] FlowTriver device was advanced over the wire into the lateral basilar segmental and Truncus Anterior branches of the right pulmonary arterial system. Mechanical aspiration thrombectomy was performed extirpating thrombus. Post thrombectomy pulmonary angiogram demonstrated significant reduction in overall clot burden in the right pulmonary arterial branches. The left lateral basilar segmental Bert Givans of the lower lobe artery was selected with the burn catheter. The 20 [place] FlowTriver device was advanced over the wire into the lateral basilar segmental branches of the left pulmonary arterial system. No significant thrombus could be removed with the 20 [place] device. The 20 French device was removed and the 24 [place] FlowTriver device was inserted. The tip was advanced into the main left pulmonary artery. The T20 FlowTriver device was then inserted through the 24 French device into the lateral basilar segment, and mechanical aspiration thrombectomy was  performed extirpating thrombus. Post aspiration pulmonary angiogram demonstrated significant reduction in overall clot burden. Prior thrombectomy, the patient's blood pressure measured 87/67 mm Hg with a heart rate of 107 beats per minute. Post thrombectomy, pressures improved to 117/64 mm Hg with heart rate of 81 beats per minute. Patient's oxygenation improved from 93% on 15 L by non-rebreather 200% on 10 L by non-rebreather. FlowTriever device was removed. Bern catheter was advanced through the cord dry seal to the infrarenal inferior vena cava and venogram was performed in order to delineate the origin of the renal veins. Bern catheter was removed over a guidewire and Denali IVC filter was deployed in the infrarenal vena cava. The [place] Dry Seal sheath was removed and hemostasis achieved with purse-string suture and manual compression. IMPRESSION: 1. Bilateral pulmonary angiogram and Inari sunction thrombectomy as above.  2. Denali IVC filter placed. Electronically Signed   By: Miachel Roux M.D.   On: 12/15/2020 18:38   IR THROMBECT PRIM MECH INIT (INCLU) MOD SED  Result Date: 12/15/2020 INDICATION: 60 year old woman with sub massive pulmonary artery emboli with RV LV ratio of 3.4. Patient has a history of GBM and cannot receive tPA. Prior to the procedure patient was on non-rebreather at 15 L, with oxygen saturation of 93%. Interventional radiology consulted for catheter directed thrombectomy of pulmonary embolism. IVC filter also requested given history of lower extremity DVT and GBM. EXAM: 1. Ultrasound-guided access of right common femoral vein 2. Bilateral pulmonary venogram 3. Bilateral pulmonary suction thrombectomy 4. IVC filter placement COMPARISON:  CT angiography chest 12/14/2020 MEDICATIONS: None. ANESTHESIA/SEDATION: None. The patient was monitored by the anesthesia team throughout the procedure given their tenuous cardiovascular status. FLUOROSCOPY TIME:  Fluoroscopy Time: 53 minutes 42 seconds  (132 mGy). COMPLICATIONS: None immediate. TECHNIQUE: Informed written consent was obtained from the patient after a thorough discussion of the procedural risks, benefits and alternatives. All questions were addressed. Maximal Sterile Barrier Technique was utilized including caps, mask, sterile gowns, sterile gloves, sterile drape, hand hygiene and skin antiseptic. A timeout was performed prior to the initiation of the procedure. Right groin skin prepped and draped in usual fashion. The right common femoral vein was evaluated with ultrasound and shown to be patent. A permanent ultrasound image was obtained and placed in the patient's medical record. Using sterile gel and a sterile probe cover the right common femoral vein was entered with a 21 gauge needle during real time ultrasound guidance. 21 gauge needle exchanged for transitional dilator set over 0.018 in guidewire. Transitional dilator set exchanged for 6 French sheath over 0.035 in guidewire. Despite multiple attempts with single and double angle pigtail catheter, the pulmonary artery could not be accessed. The short 6 French sheath was exchanged for a 90 cm 7 [place] sheath. The tip of the sheath position just below the cavoatrial junction. The long 7 French sheath allowed for additional stability and the main pulmonary artery was accessed with a 6 [place] double angle pigtail catheter. Pre thrombectomy pulmonary artery pressure: 54/31 mmHg (mean 41 mmHg) Double angled pigtail catheter removed over exchange length Amplatz guidewire. Serial dilation was performed and 7 [place] sheath exchanged for [employer] 24 French sheath. Utilizing a Bern catheter, the guidewire was directed into a right lower lobe pulmonary artery Aunika Kirsten. 52 [place] FlowTriver device was advanced over the wire into the lateral basilar segmental and Truncus Anterior branches of the right pulmonary arterial system. Mechanical aspiration thrombectomy was performed extirpating thrombus.  Post thrombectomy pulmonary angiogram demonstrated significant reduction in overall clot burden in the right pulmonary arterial branches. The left lateral basilar segmental Elbert Polyakov of the lower lobe artery was selected with the burn catheter. The 20 [place] FlowTriver device was advanced over the wire into the lateral basilar segmental branches of the left pulmonary arterial system. No significant thrombus could be removed with the 20 [place] device. The 20 French device was removed and the 24 [place] FlowTriver device was inserted. The tip was advanced into the main left pulmonary artery. The T20 FlowTriver device was then inserted through the 24 French device into the lateral basilar segment, and mechanical aspiration thrombectomy was performed extirpating thrombus. Post aspiration pulmonary angiogram demonstrated significant reduction in overall clot burden. Prior thrombectomy, the patient's blood pressure measured 87/67 mm Hg with a heart rate of 107 beats per minute. Post thrombectomy, pressures improved  to 117/64 mm Hg with heart rate of 81 beats per minute. Patient's oxygenation improved from 93% on 15 L by non-rebreather 200% on 10 L by non-rebreather. FlowTriever device was removed. Bern catheter was advanced through the cord dry seal to the infrarenal inferior vena cava and venogram was performed in order to delineate the origin of the renal veins. Bern catheter was removed over a guidewire and Denali IVC filter was deployed in the infrarenal vena cava. The [place] Dry Seal sheath was removed and hemostasis achieved with purse-string suture and manual compression. IMPRESSION: 1. Bilateral pulmonary angiogram and Inari sunction thrombectomy as above. 2. Denali IVC filter placed. Electronically Signed   By: Miachel Roux M.D.   On: 12/15/2020 18:38   EEG adult  Result Date: 12/15/2020 Lora Havens, MD     12/15/2020 10:32 AM Patient Name: ORAH SONNEN MRN: [id number] Epilepsy Attending: Lora Havens  Referring Physician/Provider: Dr Renee Pain Date: 12/15/2020 Duration: 23.35 mins Patient history: 60 yo with GBM. EEG to evaluate for seizure Level of alertness: Awake AEDs during EEG study: LEV Technical aspects: This EEG study was done with scalp electrodes positioned according to the 10-20 International system of electrode placement. Electrical activity was acquired at a sampling rate of _0  and reviewed with a high frequency filter of _1  and a low frequency filter of _2 . EEG data were recorded continuously and digitally stored. Description: The posterior dominant rhythm consists of 10 Hz activity of moderate voltage (25-35 uV) seen predominantly in posterior head regions, symmetric and reactive to eye opening and eye closing. Hyperventilation and photic stimulation were not performed.   IMPRESSION: This study is within normal limits. No seizures or epileptiform discharges were seen throughout the recording. Lora Havens   ECHOCARDIOGRAM COMPLETE  Result Date: 12/16/2020    ECHOCARDIOGRAM REPORT   Patient Name:   BETZABETH DERRINGER Date of Exam: 12/16/2020 Medical Rec #:  [id number]        Height:       64.0 in Accession #:    [id number]       Weight:       132.7 lb Date of Birth:  10-19-1961        BSA:          1.643 m Patient Age:    76 years         BP:           112/78 mmHg Patient Gender: F                HR:           76 bpm. Exam Location:  Inpatient Procedure: 2D Echo Indications:    congestive heart failure  History:        Patient has no prior history of Echocardiogram examinations.                 Pulmonary embolus; Signs/Symptoms:Altered Mental Status and                 Dyspnea.  Sonographer:    Johny Chess Referring Phys: [id number] SEONG-JOO JEONG IMPRESSIONS  1. Left ventricular ejection fraction, by estimation, is 60 to 65%. The left ventricle has normal function. The left ventricle has no regional wall motion abnormalities. Left ventricular diastolic parameters were normal.  2.  Right ventricular systolic function is normal. The right ventricular size is mildly enlarged. There is normal pulmonary artery systolic pressure.  3. The mitral valve is normal in structure. Trivial  mitral valve regurgitation.  4. The aortic valve is tricuspid. Aortic valve regurgitation is not visualized.  5. The inferior vena cava is normal in size with <50% respiratory variability, suggesting right atrial pressure of 8 mmHg. Comparison(s): No prior Echocardiogram. FINDINGS  Left Ventricle: Left ventricular ejection fraction, by estimation, is 60 to 65%. The left ventricle has normal function. The left ventricle has no regional wall motion abnormalities. The left ventricular internal cavity size was normal in size. There is  no left ventricular hypertrophy. Left ventricular diastolic parameters were normal. Right Ventricle: The right ventricular size is mildly enlarged. No increase in right ventricular wall thickness. Right ventricular systolic function is normal. There is normal pulmonary artery systolic pressure. The tricuspid regurgitant velocity is 2.36  m/s, and with an assumed right atrial pressure of 3 mmHg, the estimated right ventricular systolic pressure is 60.4 mmHg. Left Atrium: Left atrial size was normal in size. Right Atrium: Right atrial size was normal in size. Pericardium: There is no evidence of pericardial effusion. Mitral Valve: The mitral valve is normal in structure. Trivial mitral valve regurgitation. Tricuspid Valve: The tricuspid valve is normal in structure. Tricuspid valve regurgitation is mild. Aortic Valve: The aortic valve is tricuspid. Aortic valve regurgitation is not visualized. Pulmonic Valve: The pulmonic valve was normal in structure. Pulmonic valve regurgitation is trivial. Aorta: The aortic root and ascending aorta are structurally normal, with no evidence of dilitation. Venous: The inferior vena cava is normal in size with less than 50% respiratory variability, suggesting  right atrial pressure of 8 mmHg. IAS/Shunts: No atrial level shunt detected by color flow Doppler.  LEFT VENTRICLE PLAX 2D LVIDd:         3.70 cm  Diastology LVIDs:         2.40 cm  LV e' medial:    8.92 cm/s LV PW:         0.80 cm  LV E/e' medial:  8.6 LV IVS:        0.80 cm  LV e' lateral:   10.00 cm/s LVOT diam:     1.70 cm  LV E/e' lateral: 7.7 LV SV:         41 LV SV Index:   25 LVOT Area:     2.27 cm  RIGHT VENTRICLE             IVC RV S prime:     15.20 cm/s  IVC diam: 1.90 cm TAPSE (M-mode): 2.4 cm LEFT ATRIUM             Index       RIGHT ATRIUM           Index LA diam:        2.90 cm 1.76 cm/m  RA Area:     11.90 cm LA Vol (A2C):   37.8 ml 23.00 ml/m RA Volume:   24.80 ml  15.09 ml/m LA Vol (A4C):   24.8 ml 15.09 ml/m LA Biplane Vol: 31.5 ml 19.17 ml/m  AORTIC VALVE LVOT Vmax:   94.30 cm/s LVOT Vmean:  59.600 cm/s LVOT VTI:    0.179 m  AORTA Ao Root diam: 2.60 cm Ao Asc diam:  2.80 cm MITRAL VALVE               TRICUSPID VALVE MV Area (PHT): 3.48 cm    TR Peak grad:   22.3 mmHg MV Decel Time: 218 msec    TR Vmax:        236.00 cm/s  MV E velocity: 76.70 cm/s MV A velocity: 61.30 cm/s  SHUNTS MV E/A ratio:  1.25        Systemic VTI:  0.18 m                            Systemic Diam: 1.70 cm Gwyndolyn Kaufman MD Electronically signed by Gwyndolyn Kaufman MD Signature Date/Time: 12/16/2020/2:06:17 PM    Final    VAS [place] LOWER EXTREMITY VENOUS (DVT)  Result Date: 12/16/2020  Lower Venous DVT Study Indications: Pulmonary embolism.  Comparison Study: No prior studies. Performing Technologist: Oliver Hum RVT  Examination Guidelines: A complete evaluation includes B-mode imaging, spectral Doppler, color Doppler, and power Doppler as needed of all accessible portions of each vessel. Bilateral testing is considered an integral part of a complete examination. Limited examinations for reoccurring indications may be performed as noted. The reflux portion of the exam is performed with the patient in reverse  Trendelenburg.  +---------+---------------+---------+-----------+----------+--------------+ RIGHT    CompressibilityPhasicitySpontaneityPropertiesThrombus Aging +---------+---------------+---------+-----------+----------+--------------+ CFV      Full           Yes      Yes                                 +---------+---------------+---------+-----------+----------+--------------+ SFJ      Full                                                        +---------+---------------+---------+-----------+----------+--------------+ FV Prox  Full                                                        +---------+---------------+---------+-----------+----------+--------------+ FV Mid   None           No       No                   Acute          +---------+---------------+---------+-----------+----------+--------------+ FV DistalFull                                                        +---------+---------------+---------+-----------+----------+--------------+ PFV      Full                                                        +---------+---------------+---------+-----------+----------+--------------+ POP      Full           Yes      Yes                                 +---------+---------------+---------+-----------+----------+--------------+ PTV      Full                                                        +---------+---------------+---------+-----------+----------+--------------+  PERO     Full                                                        +---------+---------------+---------+-----------+----------+--------------+   +---------+---------------+---------+-----------+----------+--------------+ LEFT     CompressibilityPhasicitySpontaneityPropertiesThrombus Aging +---------+---------------+---------+-----------+----------+--------------+ CFV      Full           Yes      Yes                                  +---------+---------------+---------+-----------+----------+--------------+ SFJ      Full                                                        +---------+---------------+---------+-----------+----------+--------------+ FV Prox  Full                                                        +---------+---------------+---------+-----------+----------+--------------+ FV Mid   None           No       No                   Acute          +---------+---------------+---------+-----------+----------+--------------+ FV DistalNone           No       No                   Acute          +---------+---------------+---------+-----------+----------+--------------+ PFV      Full                                                        +---------+---------------+---------+-----------+----------+--------------+ POP      Full           Yes      Yes                                 +---------+---------------+---------+-----------+----------+--------------+ PTV      Full                                                        +---------+---------------+---------+-----------+----------+--------------+ PERO     Full                                                        +---------+---------------+---------+-----------+----------+--------------+  Summary: RIGHT: - Findings consistent with acute deep vein thrombosis involving the right femoral vein. - No cystic structure found in the popliteal fossa.  LEFT: - Findings consistent with acute deep vein thrombosis involving the left femoral vein. - No cystic structure found in the popliteal fossa.  *See table(s) above for measurements and observations. Electronically signed by Harold Barban MD on 12/16/2020 at 8:01:57 PM.    Final    Assessment/Plan GBM (glioblastoma multiforme) ([place]) [C71.9]   Hannah [name] is neurologically stable, although she does complain of dyspnea.  MRI demonstrates increase in enhancing volume most likely  consistent with pseudo-progression, although organic tumor progression is also a consideration.    For PE/DVT, she is tolerating Eliquis well without complication.  Will obtain CXR today to rule out primary lung process such as pulmonary edema, collection.    We recommended continuing treatment with cycle #2 Temozolomide 150 mg/m2, on for five days and off for twenty three days in twenty eight day cycles. The patient will have a complete blood count performed on days 21 and 28 of each cycle, and a comprehensive metabolic panel performed on day 28 of each cycle. Labs may need to be performed more often. Zofran will prescribed for home use for nausea/vomiting.   Chemotherapy should be held for the following:  ANC less than 1,000  Platelets less than 100,000  LFT or creatinine greater than 2x ULN  If clinical concerns/contraindications develop   Recommend continued gradual dose reduction of decadron to 33m daily if tolerated.  Because of mood lability, she may transition off Keppra as follows: -Start Lamictal 565mdaily x1 week -Increase to 10064maily x1 week -Then decrease keppra to 250m62mD x1 week -Then stop keppra  We counseled her regarding Lamictal rash.  We ask that Sharlena A Breden return to clinic in 1 month prior to cycle #3.   All questions were answered. The patient knows to call the clinic with any problems, questions or concerns. No barriers to learning were detected.  The total time spent in the encounter was 40 minutes and more than 50% was on counseling and review of test results   ZachVentura Sellers Medical Director of Neuro-Oncology [facility] Center[facility]esl[facility]ontiac07/22 10:36 AM

## 2021-01-10 ENCOUNTER — Encounter: Payer: Self-pay | Admitting: Internal Medicine

## 2021-01-26 ENCOUNTER — Inpatient Hospital Stay: Payer: 59 | Admitting: Licensed Clinical Social Worker

## 2021-01-26 ENCOUNTER — Other Ambulatory Visit: Payer: Self-pay | Admitting: Internal Medicine

## 2021-01-26 ENCOUNTER — Other Ambulatory Visit: Payer: Self-pay

## 2021-01-26 DIAGNOSIS — C719 Malignant neoplasm of brain, unspecified: Secondary | ICD-10-CM

## 2021-01-26 MED ORDER — LEVETIRACETAM 500 MG PO TABS: 500.0000 mg | ORAL_TABLET | Freq: Two times a day (BID) | ORAL | 3 refills | Status: DC

## 2021-01-26 MED ORDER — DEXAMETHASONE 1 MG PO TABS: 4.0000 mg | ORAL_TABLET | Freq: Every day | ORAL | 1 refills | Status: DC

## 2021-01-26 NOTE — Telephone Encounter (Signed)
Spoke to Ms. Friscia and her husband.  She describes a 1-2 day history of "many episodes" of right leg shaking and jerking, lasting seconds or minutes, consistent with prior seizure activity.  She feels this began when starting the Lamictal on Wednesday.  She does describe weakness in the leg that is clearly increased from just several days ago.    There is certainly suspicion for focal status here, if not then at least a seizure cluster.    We recommended the following intervention: -NOW dose of ativan 2mg , PO -NOW loading dose of Keppra 1500mg , followed by an additional 2000mg  this evening -NOW dose of dexamethasone 4mg  PO, in addition to 3mg  she already took this morning  If seizure events are not resolved, or at least significantly improved by tomorrow AM, she should go to the ED for higher level interventions.   She and her husband understand and are agreeable with this plan.  [name], MD

## 2021-01-26 NOTE — Progress Notes (Signed)
[facility]  Clinical Social Work was referred to review and complete healthcare advance directives.  Clinical Social Worker met with patient, paitient's husband and chaplain in [place] office.  The patient designated Allora Bains as their primary healthcare agent and Xaria Judon as their secondary agent.  Patient has previously completed a healthcare living will.    Clinical Social Worker notarized documents and made copies for patient/family. Clinical Social Worker will send documents to medical records to be scanned into patient's chart. Clinical Social Worker encouraged patient/family to contact with any additional questions or concerns.    Clayton Bibles, MSW, [place] Worker [facility] [phone]

## 2021-01-29 ENCOUNTER — Other Ambulatory Visit: Payer: Self-pay | Admitting: Internal Medicine

## 2021-01-29 ENCOUNTER — Telehealth: Payer: Self-pay

## 2021-01-29 ENCOUNTER — Telehealth: Payer: Self-pay | Admitting: Internal Medicine

## 2021-01-29 MED ORDER — LEVETIRACETAM 500 MG PO TABS: 1500.0000 mg | ORAL_TABLET | Freq: Two times a day (BID) | ORAL | 3 refills | Status: DC

## 2021-01-29 MED FILL — levETIRAcetam 500 MG TABS: 500 | 25 days supply | Qty: 150 | Fill #0

## 2021-01-29 NOTE — Telephone Encounter (Signed)
Returned call to pt regarding "break through" seizures. Pt states since Friday med adjustment by Dr Mickeal Skinner, pt is still having some breakthrough seizures. The seizures have decreased over the last 24 hours but have not totally stopped. Pt states she needs to refill Keppra but the current prescription needs to be re-written so she can fill it, due to her increase in dose. Pt wanting to know how much she can increase Keppra and ativan doses. Will give this information to Dr Mickeal Skinner for review. Let pt know we would get back with her today. Pt verbalized understanding.

## 2021-01-29 NOTE — Telephone Encounter (Signed)
Scheduled appt per 3/28 sch msg. Pt aware.  

## 2021-01-30 ENCOUNTER — Inpatient Hospital Stay (HOSPITAL_BASED_OUTPATIENT_CLINIC_OR_DEPARTMENT_OTHER): Payer: 59 | Admitting: Internal Medicine

## 2021-01-30 ENCOUNTER — Other Ambulatory Visit: Payer: Self-pay

## 2021-01-30 ENCOUNTER — Other Ambulatory Visit: Payer: Self-pay | Admitting: Internal Medicine

## 2021-01-30 VITALS — BP 113/69 | HR 58 | Temp 97.6°F | Resp 17 | Ht 64.0 in | Wt 134.4 lb

## 2021-01-30 DIAGNOSIS — I2692 Saddle embolus of pulmonary artery without acute cor pulmonale: Secondary | ICD-10-CM | POA: Diagnosis not present

## 2021-01-30 DIAGNOSIS — Z923 Personal history of irradiation: Secondary | ICD-10-CM | POA: Diagnosis not present

## 2021-01-30 DIAGNOSIS — C719 Malignant neoplasm of brain, unspecified: Secondary | ICD-10-CM | POA: Diagnosis not present

## 2021-01-30 DIAGNOSIS — G40909 Epilepsy, unspecified, not intractable, without status epilepticus: Secondary | ICD-10-CM | POA: Diagnosis not present

## 2021-01-30 DIAGNOSIS — Z7952 Long term (current) use of systemic steroids: Secondary | ICD-10-CM | POA: Diagnosis not present

## 2021-01-30 DIAGNOSIS — Z7901 Long term (current) use of anticoagulants: Secondary | ICD-10-CM | POA: Diagnosis not present

## 2021-01-30 DIAGNOSIS — Z9221 Personal history of antineoplastic chemotherapy: Secondary | ICD-10-CM | POA: Diagnosis not present

## 2021-01-30 DIAGNOSIS — C711 Malignant neoplasm of frontal lobe: Secondary | ICD-10-CM | POA: Diagnosis not present

## 2021-01-30 DIAGNOSIS — Z86718 Personal history of other venous thrombosis and embolism: Secondary | ICD-10-CM | POA: Diagnosis not present

## 2021-01-30 DIAGNOSIS — Z79899 Other long term (current) drug therapy: Secondary | ICD-10-CM | POA: Diagnosis not present

## 2021-01-30 DIAGNOSIS — Z87891 Personal history of nicotine dependence: Secondary | ICD-10-CM | POA: Diagnosis not present

## 2021-01-30 MED ORDER — LACOSAMIDE 100 MG PO TABS: 100.0000 mg | ORAL_TABLET | Freq: Two times a day (BID) | ORAL | 3 refills | Status: DC

## 2021-01-30 MED FILL — VIMPAT 100 MG TABLET: 100 | 30 days supply | Qty: 60 | Fill #0

## 2021-01-30 NOTE — Progress Notes (Signed)
[facility] [place], [place] [address] [phone]   Interval Evaluation  Date of Service: 01/30/21 Patient Name: Hannah [name] Patient MRN: [id number] Patient DOB: [birth year] Provider: [name], MD  Identifying Statement:  Hannah [name] is a 60 y.o. female with left frontal glioblastoma   Oncologic History: Oncology History  GBM (glioblastoma multiforme) ([place])   Initial Diagnosis   GBM (glioblastoma multiforme) ([place])   02/17/2020 Surgery   Craniotomy, left frontal resection by Dr. Marcello Moores.  Path is glioblastoma.   08/28/2020 - 10/09/2020 Radiation Therapy   Radiation and concurrent Temozolomide 103m/m2 daily   11/10/2020 -  Chemotherapy   Initiates cycle #1 5-day Temozolomide      [facility]   Ad[facility] for saddle [facility], undergoes thrombectomy and IVC filter placement     Biomarkers:  MGMT Methylated.  IDH 1/2 Wild type.  EGFR Unknown  TERT Unknown   Interval History:  Hannah [name] presents today for follow up after recent seizure cluster, previously noted in telephone visit from 01/26/21.  She describes ongoing seizure events, characterized by right leg leg shaking for ~30 seconds, occurring almost once per hour.  Two total events since waking up at 6:15 this morning.  She has only been dosing Keppra 5060mBID, she is anxious about taking full dose.  Right leg is very weak since the seizure cluster began at the end of last week.  Decadron currently dosed at 75m23maily.    Decadron 11/09/20: 2mg36m/10/22: 8mg 55m21/22: 6mg 060m7/22: 75mg 0320m/22: 8mg  H+48m3/18/21) Patient presented one month ago with sudden onset left arm and leg weakness and interrupted speech, [name], [name], stroke eval and CNS imaging which demonstrated a non-enhancing left frontal  mass.  At this time she is back to baseline without recurrence of events, taking Keppra 500mg twi51mer day.  No history or seizure or any other neurologic events.  She does describe being sleep deprived prior to seizure event.  She presents today after one month follow up MRI scan.  Medications: Current Outpatient Medications on File Prior to Visit  Medication Sig Dispense Refill  . apixaban (ELIQUIS) 5 MG TABS tablet Take 1 tablet (5 mg total) by mouth 2 (two) times daily. 60 tablet 3  . cholecalciferol (VITAMIN D3) 25 MCG (1000 UNIT) tablet Take 1,000 Units by mouth daily.    . dexametMarland Kitchenasone (DECADRON) 1 MG tablet Take 4 tablets (4 mg total) by mouth daily. 120 tablet 1  . docusate sodium (COLACE) 100 MG capsule Take 100 mg by mouth 2 (two) times daily as needed for mild constipation.    . levETIRAcetam (KEPPRA) 500 MG tablet Take 3 tablets (1,500 mg total) by mouth 2 (two) times daily. 150 tablet 3  . LORazepam (ATIVAN) 0.5 MG tablet Place 1 tablet (0.5 mg total) under the tongue every 6 (six) hours as needed for anxiety. (Patient not taking: No sig reported) 45 tablet 0  . ondansetron (ZOFRAN) 8 MG tablet Take 1 tablet (8 mg total) by mouth 2 (two) times daily as needed (nausea and vomiting). May take 30-60 minutes prior to Temodar administration if nausea/vomiting occurs. (Patient not taking: No sig reported) 30 tablet 1  . PREVIDENT 5000 DRY MOUTH 1.1 % GEL dental gel SMARTSIG:Sparingly To Teeth Every Night (Patient  not taking: Reported on 01/30/2021)    . temozolomide (TEMODAR) 100 MG capsule Take 1 capsule (100 mg total) by mouth daily. May take on an empty stomach to decrease nausea & vomiting. (Patient not taking: Reported on 01/08/2021) 42 capsule 0  . temozolomide (TEMODAR) 20 MG capsule Take 1 capsule (20 mg total) by mouth daily. May take on an empty stomach to decrease nausea & vomiting. 42 capsule 0   No current facility-administered medications on file prior to visit.    Allergies:  No Known Allergies Past Medical History:  Past Medical History:  Diagnosis Date  . Cervical spondylolysis 12/15/2019   Skeleton: Mild cervical spondylosis C6-7. Incidental find.   . Colon polyp   . GBM (glioblastoma multiforme) ([place])   . Left ankle sprain 10/07/2011  . Seizure ([place])    partial   . Tick bite 02/2020   Past Surgical History:  Past Surgical History:  Procedure Laterality Date  . APPLICATION OF CRANIAL NAVIGATION Left 02/16/2020   Procedure: APPLICATION OF CRANIAL NAVIGATION;  Surgeon: Vallarie Mare, MD;  Location: [facility];  Service: Neurosurgery;  Laterality: Left;  . CRANIOTOMY Left 02/16/2020   Procedure: LEFT FRONTAL CRANIOTOMY FOR BRAIN TUMOR;  Surgeon: Vallarie Mare, MD;  Location: [facility];  Service: Neurosurgery;  Laterality: Left;  . FOOT SURGERY Left    bone spur - local anesthesia  . IR ANGIOGRAM PULMONARY BILATERAL SELECTIVE  12/15/2020  . IR ANGIOGRAM SELECTIVE EACH ADDITIONAL VESSEL  12/15/2020  . IR ANGIOGRAM SELECTIVE EACH ADDITIONAL VESSEL  12/15/2020  . IR IVC FILTER PLMT / S&I /IMG GUID/MOD SED  12/15/2020  . IR THROMBECT PRIM MECH INIT (INCLU) MOD SED  12/15/2020  . IR THROMBECT PRIM MECH INIT (INCLU) MOD SED  12/15/2020  . RADIOLOGY WITH ANESTHESIA N/A 12/15/2020   Procedure: IR WITH ANESTHESIA;  Surgeon: Radiologist, Medication, MD;  Location: [facility];  Service: Radiology;  Laterality: N/A;  . TONSILECTOMY/ADENOIDECTOMY WITH MYRINGOTOMY     tonsils and adenoids out only  . WISDOM TOOTH EXTRACTION     Social History:  Social History   Socioeconomic History  . Marital status: Married    Spouse name: Not on file  . Number of children: 3  . Years of education: Not on file  . Highest education level: Not on file  Occupational History  . Not on file  Tobacco Use  . Smoking status: Former Smoker    Years: 2.00    Types: Cigarettes    Quit date: 11/05/1979    Years since quitting: 41.2  . Smokeless tobacco: Never Used  . Tobacco comment: 1/2 pack  a week  Vaping Use  . Vaping Use: Never used  Substance and Sexual Activity  . Alcohol use: Yes    Alcohol/week: 0.0 standard drinks    Comment: 1 glass of wine weekly  . Drug use: No  . Sexual activity: Yes    Birth control/protection: Surgical  Other Topics Concern  . Not on file  Social History Narrative  . Not on file   Social Determinants of Health   Financial Resource Strain: Not on file  Food Insecurity: Not on file  Transportation Needs: Not on file  Physical Activity: Not on file  Stress: Not on file  Social Connections: Not on file  Intimate Partner Violence: Not on file   Family History:  Family History  Problem Relation Age of Onset  . Hypertension Mother   . Stroke Mother   . Diabetes Mother   .  Colon cancer Father 65  . Hypertension Sister   . Colon polyps Sister   . Heart disease Brother 67  . Diabetes Maternal Uncle   . Diabetes Maternal Uncle     Review of Systems: Constitutional: Doesn't report fevers, chills or abnormal weight loss Eyes: Doesn't report blurriness of vision Ears, nose, mouth, throat, and face: Doesn't report sore throat Respiratory: Chest wall pain Cardiovascular: Doesn't report palpitation, chest discomfort  Gastrointestinal:  Doesn't report nausea, constipation, diarrhea GU: Doesn't report incontinence Skin: Doesn't report skin rashes Neurological: Per HPI Musculoskeletal: Doesn't report joint pain Behavioral/Psych: Doesn't report anxiety  Physical Exam: Vitals:   01/30/21 1015  BP: 113/69  Pulse: (!) 58  Resp: 17  Temp: 97.6 F (36.4 C)  SpO2: 100%   KPS: 60. General: Alert, cooperative, pleasant, in no acute distress Head: Normal EENT: No conjunctival injection or scleral icterus.  Lungs: Resp effort normal Cardiac: Regular rate Abdomen: Non-distended abdomen Skin: No rashes cyanosis or petechiae. Extremities: No clubbing or edema  Neurologic Exam: Mental Status: Awake, alert, attentive to examiner.  Oriented to self and environment. Language is fluent with intact comprehension.  Occasional interruptions in fluency.  Cranial Nerves: Visual acuity is grossly normal. Visual fields are full. Extra-ocular movements intact. No ptosis. Face is symmetric Motor: Tone and bulk are normal. Right leg 3/5, right hand fine motor impairment only. Reflexes are symmetric, no pathologic reflexes present.  Sensory: Intact to light touch Gait: Independent but dystaxic  Labs: I have reviewed the data as listed    Component Value Date/Time   NA 140 12/25/2020 1422   K 4.3 12/25/2020 1422   CL 104 12/25/2020 1422   CO2 26 12/25/2020 1422   GLUCOSE 121 (H) 12/25/2020 1422   GLUCOSE 85 10/13/2006 1158   BUN 19 12/25/2020 1422   CREATININE 0.83 12/25/2020 1422   CALCIUM 9.1 12/25/2020 1422   PROT 6.5 12/25/2020 1422   ALBUMIN 3.8 12/25/2020 1422   AST 14 (L) 12/25/2020 1422   ALT 22 12/25/2020 1422   ALKPHOS 56 12/25/2020 1422   BILITOT 0.3 12/25/2020 1422   GFRNONAA >60 12/25/2020 1422   GFRAA >60 02/17/2020 0136   Lab Results  Component Value Date   WBC 6.8 12/25/2020   NEUTROABS 6.1 12/25/2020   HGB 12.1 12/25/2020   HCT 35.2 (L) 12/25/2020   MCV 96.7 12/25/2020   PLT 249 12/25/2020   Assessment/Plan GBM (glioblastoma multiforme) ([facility])  Seizure disorder ([facility])    Hannah [name] presents today with seizure cluster, 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  Seizure frequency seemed to improve to some extent after interventions over the weekend, but she has not resumed the full dose Keppra recommended 1576m BID.  There does not appear to be underlying provocation aside from known CNS inflammation.  We recommended the following: -Now dose of ativan 239mPO, once -Increase Keppra to 150066mID -Initiate therapy with Vimpat 100m21mD after 200mg38m loading dose -Increase decadron to 4mg B59mfor now  If not improved with these measures, she understands she  will likely need to be admitted for long term EEG monitoring, further titration of anti-epileptics inpatient.  We will give her a call on 3/31 to assess response to these interventions.  All questions were answered. The patient knows to call the clinic with any problems, questions or concerns. No barriers to learning were detected.  The total time spent in the encounter was 40 minutes and more than 50% was on counseling  and review of test results   [name], MD Medical Director of Neuro-Oncology [facility] [place] 01/30/21 2:36 PM

## 2021-01-31 ENCOUNTER — Telehealth: Payer: Self-pay

## 2021-01-31 ENCOUNTER — Emergency Department (HOSPITAL_COMMUNITY)
Admission: EM | Admit: 2021-01-31 | Discharge: 2021-01-31 | Disposition: A | Discharge status: Home / Self Care | Payer: 59 | Attending: Emergency Medicine | Admitting: Emergency Medicine

## 2021-01-31 ENCOUNTER — Encounter (HOSPITAL_COMMUNITY): Payer: Self-pay | Admitting: Emergency Medicine

## 2021-01-31 DIAGNOSIS — R040 Epistaxis: Secondary | ICD-10-CM | POA: Diagnosis not present

## 2021-01-31 DIAGNOSIS — Z87891 Personal history of nicotine dependence: Secondary | ICD-10-CM | POA: Diagnosis not present

## 2021-01-31 DIAGNOSIS — Z7902 Long term (current) use of antithrombotics/antiplatelets: Secondary | ICD-10-CM | POA: Insufficient documentation

## 2021-01-31 MED ORDER — OXYMETAZOLINE HCL 0.05 % NA SOLN
1.0000 | Freq: Once | NASAL | Status: AC
  Administered 2021-01-31: 1 via NASAL
  Filled 2021-01-31: qty 30

## 2021-01-31 NOTE — ED Triage Notes (Signed)
Per pt, states she was recently placed on a new med for a brain tumor-had a nose bleed for about 1 hour today-states she is on a blood thinner

## 2021-01-31 NOTE — ED Provider Notes (Signed)
[facility] DEPT Provider Note   CSN: [id number] Arrival date & time: 01/31/21  1447     History Chief Complaint  Patient presents with  . Epistaxis    Hannah [name] is a 60 y.o. female presenting for evaluation of nosebleed.   Patient states approximately 1 hour prior to arrival she had bleeding from her left side of her nose.  She has tried pinching her nose and packing with a tissue without improvement.  She denies headache, shortness of breath, dizziness.  She can feel it draining down her throat.  She is on Eliquis, this was started in the fall.  She denies trauma or injury to her nose.  She does have a history of glioblastoma and seizures, followed by Dr. Mickeal Skinner.  She was started on Vimpat yesterday.  She denies bleeding or injury elsewhere.   HPI     Past Medical History:  Diagnosis Date  . Cervical spondylolysis 12/15/2019   Skeleton: Mild cervical spondylosis C6-7. Incidental find.   . Colon polyp   . GBM (glioblastoma multiforme) ([place])   . Left ankle sprain 10/07/2011  . Seizure ([place])    partial   . Tick bite 02/2020    Patient Active Problem List   Diagnosis Date Noted  . Gait instability 12/28/2020  . Sleep disturbance 12/28/2020  . At high risk for injury related to fall 12/27/2020  . Acute massive pulmonary embolism ([place]) 12/15/2020  . Acute hypoxemic respiratory failure ([place]) 12/15/2020  . DNR (do not resuscitate) 12/15/2020  . 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 Body mass index (BMI) 26.0-26.9, adult 06/05/2020  . GBM (glioblastoma multiforme) ([place])   . Steroid-induced hyperglycemia   . Bradycardia   . Status post craniotomy 02/16/2020  . History of recent hospitalization 12/21/2019  . Seizure disorder ([place]) 12/21/2019  . Hypokalemia 12/21/2019  . Family history of colon cancer - father 44 05/26/2015  . Vitamin D deficiency 03/10/2015    Past Surgical History:  Procedure Laterality Date  . APPLICATION OF  CRANIAL NAVIGATION Left 02/16/2020   Procedure: APPLICATION OF CRANIAL NAVIGATION;  Surgeon: Vallarie Mare, MD;  Location: [facility];  Service: Neurosurgery;  Laterality: Left;  . CRANIOTOMY Left 02/16/2020   Procedure: LEFT FRONTAL CRANIOTOMY FOR BRAIN TUMOR;  Surgeon: Vallarie Mare, MD;  Location: [facility];  Service: Neurosurgery;  Laterality: Left;  . FOOT SURGERY Left    bone spur - local anesthesia  . IR ANGIOGRAM PULMONARY BILATERAL SELECTIVE  12/15/2020  . IR ANGIOGRAM SELECTIVE EACH ADDITIONAL VESSEL  12/15/2020  . IR ANGIOGRAM SELECTIVE EACH ADDITIONAL VESSEL  12/15/2020  . IR IVC FILTER PLMT / S&I /IMG GUID/MOD SED  12/15/2020  . IR THROMBECT PRIM MECH INIT (INCLU) MOD SED  12/15/2020  . IR THROMBECT PRIM MECH INIT (INCLU) MOD SED  12/15/2020  . RADIOLOGY WITH ANESTHESIA N/A 12/15/2020   Procedure: IR WITH ANESTHESIA;  Surgeon: Radiologist, Medication, MD;  Location: [facility];  Service: Radiology;  Laterality: N/A;  . TONSILECTOMY/ADENOIDECTOMY WITH MYRINGOTOMY     tonsils and adenoids out only  . WISDOM TOOTH EXTRACTION       OB History   No obstetric history on file.     Family History  Problem Relation Age of Onset  . Hypertension Mother   . Stroke Mother   . Diabetes Mother   . Colon cancer Father 25  . Hypertension Sister   . Colon polyps Sister   . Heart disease Brother 36  . Diabetes  Maternal Uncle   . Diabetes Maternal Uncle     Social History   Tobacco Use  . Smoking status: Former Smoker    Years: 2.00    Types: Cigarettes    Quit date: 11/05/1979    Years since quitting: 41.2  . Smokeless tobacco: Never Used  . Tobacco comment: 1/2 pack a week  Vaping Use  . Vaping Use: Never used  Substance Use Topics  . Alcohol use: Yes    Alcohol/week: 0.0 standard drinks    Comment: 1 glass of wine weekly  . Drug use: No    Home Medications Prior to Admission medications   Medication Sig Start Date End Date Taking? Authorizing Provider  apixaban (ELIQUIS) 5 MG  TABS tablet Take 1 tablet (5 mg total) by mouth 2 (two) times daily. 12/24/20   Danford, Suann Larry, MD  cholecalciferol (VITAMIN D3) 25 MCG (1000 UNIT) tablet Take 1,000 Units by mouth daily.    [provider]  dexamethasone (DECADRON) 1 MG tablet Take 4 tablets (4 mg total) by mouth daily. 01/26/21   [name], MD  docusate sodium (COLACE) 100 MG capsule Take 100 mg by mouth 2 (two) times daily as needed for mild constipation.    [provider]  Lacosamide 100 MG TABS Take 1 tablet (100 mg total) by mouth in the morning and at bedtime. 01/30/21   [name], MD  levETIRAcetam (KEPPRA) 500 MG tablet Take 3 tablets (1,500 mg total) by mouth 2 (two) times daily. 01/29/21   Vaslow, Acey Lav, MD  LORazepam (ATIVAN) 0.5 MG tablet Place 1 tablet (0.5 mg total) under the tongue every 6 (six) hours as needed for anxiety. Patient not taking: No sig reported 10/03/20   Sandi Mealy E., PA-C  ondansetron (ZOFRAN) 8 MG tablet Take 1 tablet (8 mg total) by mouth 2 (two) times daily as needed (nausea and vomiting). May take 30-60 minutes prior to Temodar administration if nausea/vomiting occurs. Patient not taking: No sig reported 08/15/20   [name], MD  PREVIDENT 5000 DRY MOUTH 1.1 % GEL dental gel SMARTSIG:Sparingly To Teeth Every Night Patient not taking: Reported on 01/30/2021 11/17/20   [provider]  temozolomide (TEMODAR) 100 MG capsule Take 1 capsule (100 mg total) by mouth daily. May take on an empty stomach to decrease nausea & vomiting. Patient not taking: Reported on 01/08/2021 08/15/20   [name], MD  temozolomide (TEMODAR) 20 MG capsule Take 1 capsule (20 mg total) by mouth daily. May take on an empty stomach to decrease nausea & vomiting. 08/15/20   [name], MD    Allergies    Patient has no known allergies.  Review of Systems   Review of Systems  HENT: Positive for nosebleeds.   Hematological: Bruises/bleeds easily.  All  other systems reviewed and are negative.   Physical Exam Updated Vital Signs BP 114/72 (BP Location: Right Arm)   Pulse 64   Temp 98.2 F (36.8 C) (Oral)   Resp 18   Ht 5\' 5"  (1.651 m)   Wt 60.8 kg   LMP 06/30/2015   SpO2 95%   BMI 22.30 kg/m   Physical Exam Vitals and nursing note reviewed.  Constitutional:      General: She is not in acute distress.    Appearance: She is well-developed.     Comments: Resting in the bed in NAD  HENT:     Head: Normocephalic and atraumatic.     Nose:  Comments: Minimal oozing from the L nare. No obvious source. No blood in the OP Eyes:     Conjunctiva/sclera: Conjunctivae normal.     Pupils: Pupils are equal, round, and reactive to light.  Cardiovascular:     Rate and Rhythm: Normal rate and regular rhythm.     Pulses: Normal pulses.  Pulmonary:     Effort: Pulmonary effort is normal. No respiratory distress.     Breath sounds: Normal breath sounds. No wheezing.  Abdominal:     General: There is no distension.     Palpations: Abdomen is soft. There is no mass.     Tenderness: There is no abdominal tenderness. There is no guarding or rebound.  Musculoskeletal:        General: Normal range of motion.     Cervical back: Normal range of motion and neck supple.  Skin:    General: Skin is warm and dry.     Capillary Refill: Capillary refill takes less than 2 seconds.  Neurological:     Mental Status: She is alert and oriented to person, place, and time.     ED Results / Procedures / Treatments   Labs (all labs ordered are listed, but only abnormal results are displayed) Labs Reviewed - No data to display  EKG None  Radiology No results found.  Procedures .Epistaxis Management  Date/Time: 01/31/2021 5:48 PM Performed by: Franchot Heidelberg, PA-C Authorized by: Franchot Heidelberg, PA-C   Consent:    Consent obtained:  Verbal   Consent given by:  Patient   Risks discussed:  Bleeding Procedure details:    Treatment  site: L nare.   Treatment method:  Anterior pack (afrin packing)   Treatment complexity:  Limited   Treatment episode: initial   Post-procedure details:    Assessment:  Bleeding stopped   Procedure completion:  Tolerated well, no immediate complications     Medications Ordered in ED Medications  oxymetazoline (AFRIN) 0.05 % nasal spray 1 spray (1 spray Each Nare Given 01/31/21 1543)    ED Course  I have reviewed the triage vital signs and the nursing notes.  Pertinent labs & imaging results that were available during my care of the patient were reviewed by me and considered in my medical decision making (see chart for details).    MDM Rules/Calculators/A&P                          Patient presenting for evaluation of nosebleed.  On exam, patient peers nontoxic.  Bleeding has been going on for about an hour.  On my exam, she is not profusely bleeding.  Low suspicion for anemia, patient without symptoms.  Vital signs are stable.  As she only has minimal oozing, will try and treat with Afrin packing.  On reassessment, patient reports no further bleeding noted.  Afrin packing removed, will continue to monitor.  Patient without further epistaxis.  I discussed continued use of Afrin as needed if she has recurrent bleeding.  Discussed that she is at higher risk for recurrent bleeding due to being on blood thinner.  Follow-up with ENT.  At this time, patient appears safe for discharge.  Return precautions given.  Patient states she understands and agrees to plan.   Final Clinical Impression(s) / ED Diagnoses Final diagnoses:  Epistaxis    Rx / DC Orders ED Discharge Orders    None       Franchot Heidelberg, PA-C 01/31/21 1748  Sherwood Gambler, MD 02/01/21 [phone]

## 2021-01-31 NOTE — Discharge Instructions (Signed)
It was likely difficult to control your nosebleed due to being on Eliquis.  You are at risk for future nosebleeds that are tricky to control. If you have recurrent nosebleed, use Afrin as was done in the ER today to help control the bleed.  If it continues to bleed despite this, return to the ER. Follow-up with ear nose and throat doctor as needed if you continue to have nosebleeds. Return to the emergency room with any new, worsening, or concerning symptoms

## 2021-01-31 NOTE — Telephone Encounter (Signed)
Received VM from patient's daughter, Hannah [name], regarding persistent nosebleed. Called daughter back, stated nosebleed had continued for "almost an hour," that it was saturating tissues and not slowing down. No changes in patient's mental status.  Spoke with Sandi Mealy, PA, and advised daughter to take patient to ED for evaluation. Daughter verbalized understanding.

## 2021-02-12 ENCOUNTER — Other Ambulatory Visit: Payer: Self-pay | Admitting: *Deleted

## 2021-02-12 ENCOUNTER — Other Ambulatory Visit: Payer: Self-pay

## 2021-02-12 ENCOUNTER — Inpatient Hospital Stay: Payer: 59 | Attending: Internal Medicine | Admitting: Internal Medicine

## 2021-02-12 ENCOUNTER — Other Ambulatory Visit (HOSPITAL_BASED_OUTPATIENT_CLINIC_OR_DEPARTMENT_OTHER): Payer: Self-pay

## 2021-02-12 ENCOUNTER — Other Ambulatory Visit: Payer: 59

## 2021-02-12 VITALS — BP 122/72 | HR 59 | Temp 98.4°F | Resp 16 | Ht 65.0 in | Wt 138.9 lb

## 2021-02-12 DIAGNOSIS — Z87891 Personal history of nicotine dependence: Secondary | ICD-10-CM | POA: Insufficient documentation

## 2021-02-12 DIAGNOSIS — G40909 Epilepsy, unspecified, not intractable, without status epilepticus: Secondary | ICD-10-CM | POA: Diagnosis not present

## 2021-02-12 DIAGNOSIS — Z8249 Family history of ischemic heart disease and other diseases of the circulatory system: Secondary | ICD-10-CM | POA: Insufficient documentation

## 2021-02-12 DIAGNOSIS — C719 Malignant neoplasm of brain, unspecified: Secondary | ICD-10-CM

## 2021-02-12 DIAGNOSIS — R531 Weakness: Secondary | ICD-10-CM | POA: Insufficient documentation

## 2021-02-12 DIAGNOSIS — Z833 Family history of diabetes mellitus: Secondary | ICD-10-CM | POA: Insufficient documentation

## 2021-02-12 DIAGNOSIS — Z800 Family history of malignant neoplasm of digestive organs: Secondary | ICD-10-CM | POA: Diagnosis not present

## 2021-02-12 DIAGNOSIS — Z7952 Long term (current) use of systemic steroids: Secondary | ICD-10-CM | POA: Insufficient documentation

## 2021-02-12 DIAGNOSIS — R0789 Other chest pain: Secondary | ICD-10-CM | POA: Insufficient documentation

## 2021-02-12 DIAGNOSIS — Z7901 Long term (current) use of anticoagulants: Secondary | ICD-10-CM | POA: Insufficient documentation

## 2021-02-12 DIAGNOSIS — Z823 Family history of stroke: Secondary | ICD-10-CM | POA: Insufficient documentation

## 2021-02-12 DIAGNOSIS — C711 Malignant neoplasm of frontal lobe: Secondary | ICD-10-CM | POA: Diagnosis not present

## 2021-02-12 DIAGNOSIS — Z8719 Personal history of other diseases of the digestive system: Secondary | ICD-10-CM | POA: Insufficient documentation

## 2021-02-12 DIAGNOSIS — Z79899 Other long term (current) drug therapy: Secondary | ICD-10-CM | POA: Insufficient documentation

## 2021-02-12 MED ORDER — APIXABAN 5 MG PO TABS
ORAL_TABLET | Freq: Two times a day (BID) | ORAL | 3 refills | Status: DC
  Filled 2021-02-12: qty 60, 30d supply, fill #0

## 2021-02-12 NOTE — Progress Notes (Signed)
[facility] [address] [place], [place] [address] [phone]   Interval Evaluation  Date of Service: 02/12/21 Patient Name: NIGERIA LASSETER Patient MRN: [id number] Patient DOB: 1960-12-20 Provider: [name], MD  Identifying Statement:  ANITHA KREISER is a 60 y.o. female with left frontal glioblastoma   Oncologic History: Oncology History  GBM (glioblastoma multiforme) ([facility])   Initial Diagnosis   GBM (glioblastoma multiforme) ([facility])   02/17/2020 Surgery   Craniotomy, left frontal resection by Dr. Marcello Moores.  Path is glioblastoma.   08/28/2020 - 10/09/2020 Radiation Therapy   Radiation and concurrent Temozolomide 39m/m2 daily   11/10/2020 -  Chemotherapy   Initiates cycle #1 5-day Temozolomide      [facility]   Admitted for saddle PE, undergoes thrombectomy and IVC filter placement     Biomarkers:  MGMT Methylated.  IDH 1/2 Wild type.  EGFR Unknown  TERT Unknown   Interval History:  Yanice A Voges presents today for follow up.  She describes almost complete resolution of seizure events since loading and dosing the Vimpat.  Currently taking the Vimpat 1017mtwice per day and the Keppra 150056mwice per day.  Her left sided strength has improved to near baseline, and her overall activity level has increased in recent days.  Continues to use the safety belt for walking and/or walker or wheelchair at times.  Decadron currently dosed at 4mg63m AM, 2mg 63mPM.    Decadron 11/09/20: 2mg 016m0/22: 8mg 025m/22: 6mg 03/69m22: 4mg 03/275m2: 8mg 04/1174m: 6mg  H+P (84m8/21) Patient presented one month ago with sudden onset left arm and leg weakness and interrupted speech, [name], [name], stroke eval and CNS imaging which demonstrated a non-enhancing left frontal mass.  At this time  she is back to baseline without recurrence of events, taking Keppra 500mg twice 63mday.  No history or seizure or any other neurologic events.  She does describe being sleep deprived prior to seizure event.  She presents today after one month follow up MRI scan.  Medications: Current Outpatient Medications on File Prior to Visit  Medication Sig Dispense Refill  . apixaban (ELIQUIS) 5 MG TABS tablet TAKE 1 TABLET (5 MG TOTAL) BY MOUTH 2 (TWO) TIMES DAILY. 60 tablet 3  . cholecalciferol (VITAMIN D3) 25 MCG (1000 UNIT) tablet Take 1,000 Units by mouth daily.    . dexamethasMarland Kitchenne (DECADRON) 1 MG tablet TAKE 4 TABLETS BY MOUTH DAILY 120 tablet 1  . docusate sodium (COLACE) 100 MG capsule Take 100 mg by mouth 2 (two) times daily as needed for mild constipation.    . Lacosamide 100 MG TABS TAKE 1 TABLET BY MOUTH IN THE MORNING AND AT BEDTIME. 60 tablet 3  . levETIRAcetam (KEPPRA) 500 MG tablet TAKE 3 TABLETS (1,500 MG TOTAL) BY MOUTH 2 (TWO) TIMES DAILY. 150 tablet 3  . LORazepam (ATIVAN) 0.5 MG tablet PLACE 1 TABLET (0.5 MG TOTAL) UNDER THE TONGUE EVERY 6 (SIX) HOURS AS NEEDED FOR ANXIETY. (Patient not taking: No sig reported) 45 tablet 0  . ondansetron (ZOFRAN) 8 MG tablet TAKE 1 TABLET BY MOUTH TWICE DAILY AS NEEDED FOR NAUSEA AND/OR VOMITING. MAY TAKE 30 TO 60 MINUTES PRIOR TO TEMODAR ADMINISTRATION IF NEEDED (Patient not taking: Reported on 02/12/2021) 30 tablet 0  . temozolomide (TEMODAR) 100 MG  capsule TAKE 1 CAPSULE (100 MG TOTAL) BY MOUTH DAILY. MAY TAKE ON AN EMPTY STOMACH TO DECREASE NAUSEA & VOMITING. (Patient not taking: No sig reported) 42 capsule 0  . temozolomide (TEMODAR) 20 MG capsule TAKE 1 CAPSULE (20 MG TOTAL) BY MOUTH DAILY. MAY TAKE ON AN EMPTY STOMACH TO DECREASE NAUSEA & VOMITING. (Patient not taking: Reported on 02/12/2021) 42 capsule 0  . [DISCONTINUED] lamoTRIgine (LAMICTAL) 100 MG tablet Take 1 tablet (100 mg total) by mouth daily. 30 tablet 3   No current facility-administered  medications on file prior to visit.    Allergies: Not on File Past Medical History:  Past Medical History:  Diagnosis Date  . Cervical spondylolysis 12/15/2019   Skeleton: Mild cervical spondylosis C6-7. Incidental find.   . Colon polyp   . GBM (glioblastoma multiforme) ([facility])   . Left ankle sprain 10/07/2011  . Seizure ([place])    partial   . Tick bite 02/2020   Past Surgical History:  Past Surgical History:  Procedure Laterality Date  . APPLICATION OF CRANIAL NAVIGATION Left 02/16/2020   Procedure: APPLICATION OF CRANIAL NAVIGATION;  Surgeon: Vallarie Mare, MD;  Location: [facility];  Service: Neurosurgery;  Laterality: Left;  . CRANIOTOMY Left 02/16/2020   Procedure: LEFT FRONTAL CRANIOTOMY FOR BRAIN TUMOR;  Surgeon: Vallarie Mare, MD;  Location: [facility];  Service: Neurosurgery;  Laterality: Left;  . FOOT SURGERY Left    bone spur - local anesthesia  . IR ANGIOGRAM PULMONARY BILATERAL SELECTIVE  12/15/2020  . IR ANGIOGRAM SELECTIVE EACH ADDITIONAL VESSEL  12/15/2020  . IR ANGIOGRAM SELECTIVE EACH ADDITIONAL VESSEL  12/15/2020  . IR IVC FILTER PLMT / S&I /IMG GUID/MOD SED  12/15/2020  . IR THROMBECT PRIM MECH INIT (INCLU) MOD SED  12/15/2020  . IR THROMBECT PRIM MECH INIT (INCLU) MOD SED  12/15/2020  . RADIOLOGY WITH ANESTHESIA N/A 12/15/2020   Procedure: IR WITH ANESTHESIA;  Surgeon: Radiologist, Medication, MD;  Location: [facility];  Service: Radiology;  Laterality: N/A;  . TONSILECTOMY/ADENOIDECTOMY WITH MYRINGOTOMY     tonsils and adenoids out only  . WISDOM TOOTH EXTRACTION     Social History:  Social History   Socioeconomic History  . Marital status: Married    Spouse name: Not on file  . Number of children: 3  . Years of education: Not on file  . Highest education level: Not on file  Occupational History  . Not on file  Tobacco Use  . Smoking status: Former Smoker    Years: 2.00    Types: Cigarettes    Quit date: 11/05/1979    Years since quitting: 41.3  . Smokeless  tobacco: Never Used  . Tobacco comment: 1/2 pack a week  Vaping Use  . Vaping Use: Never used  Substance and Sexual Activity  . Alcohol use: Yes    Alcohol/week: 0.0 standard drinks    Comment: 1 glass of wine weekly  . Drug use: No  . Sexual activity: Yes    Birth control/protection: Surgical  Other Topics Concern  . Not on file  Social History Narrative  . Not on file   Social Determinants of Health   Financial Resource Strain: Not on file  Food Insecurity: Not on file  Transportation Needs: Not on file  Physical Activity: Not on file  Stress: Not on file  Social Connections: Not on file  Intimate Partner Violence: Not on file   Family History:  Family History  Problem Relation Age of Onset  . Hypertension  Mother   . Stroke Mother   . Diabetes Mother   . Colon cancer Father 49  . Hypertension Sister   . Colon polyps Sister   . Heart disease Brother 84  . Diabetes Maternal Uncle   . Diabetes Maternal Uncle     Review of Systems: Constitutional: Doesn't report fevers, chills or abnormal weight loss Eyes: Doesn't report blurriness of vision Ears, nose, mouth, throat, and face: Doesn't report sore throat Respiratory: Chest wall pain Cardiovascular: Doesn't report palpitation, chest discomfort  Gastrointestinal:  Doesn't report nausea, constipation, diarrhea GU: Doesn't report incontinence Skin: Doesn't report skin rashes Neurological: Per HPI Musculoskeletal: Doesn't report joint pain Behavioral/Psych: Doesn't report anxiety  Physical Exam: Vitals:   02/12/21 1213  BP: 122/72  Pulse: (!) 59  Resp: 16  Temp: 98.4 F (36.9 C)  SpO2: 99%   KPS: 60. General: Alert, cooperative, pleasant, in no acute distress Head: Normal EENT: No conjunctival injection or scleral icterus.  Lungs: Resp effort normal Cardiac: Regular rate Abdomen: Non-distended abdomen Skin: No rashes cyanosis or petechiae. Extremities: No clubbing or edema  Neurologic Exam: Mental  Status: Awake, alert, attentive to examiner. Oriented to self and environment. Language is fluent with intact comprehension.  Occasional interruptions in fluency.  Cranial Nerves: Visual acuity is grossly normal. Visual fields are full. Extra-ocular movements intact. No ptosis. Face is symmetric Motor: Tone and bulk are normal. Right leg 4+/5, right arm 4+/5. Reflexes are symmetric, no pathologic reflexes present.  Sensory: Intact to light touch Gait: Deferred today  Labs: I have reviewed the data as listed    Component Value Date/Time   NA 140 12/25/2020 1422   K 4.3 12/25/2020 1422   CL 104 12/25/2020 1422   CO2 26 12/25/2020 1422   GLUCOSE 121 (H) 12/25/2020 1422   GLUCOSE 85 10/13/2006 1158   BUN 19 12/25/2020 1422   CREATININE 0.83 12/25/2020 1422   CALCIUM 9.1 12/25/2020 1422   PROT 6.5 12/25/2020 1422   ALBUMIN 3.8 12/25/2020 1422   AST 14 (L) 12/25/2020 1422   ALT 22 12/25/2020 1422   ALKPHOS 56 12/25/2020 1422   BILITOT 0.3 12/25/2020 1422   GFRNONAA >60 12/25/2020 1422   GFRAA >60 02/17/2020 0136   Lab Results  Component Value Date   WBC 6.8 12/25/2020   NEUTROABS 6.1 12/25/2020   HGB 12.1 12/25/2020   HCT 35.2 (L) 12/25/2020   MCV 96.7 12/25/2020   PLT 249 12/25/2020   Assessment/Plan GBM (glioblastoma multiforme) (HCC)  Seizure disorder (HCC)   Jaileen A Teasdale is clinically improved today, following load and trial of Vimpat.  She reports fleeting seizure episodes over past two weeks, and demonstrates improved motor function.  Because of good control of seizures, continued irritability and overall poor efficacy of Keppra, we are ok with modest taper.  She can decrease to 105m BID x1 week, then decrease to 5068mBID thereafter.  Vimpat will continue at 10044mID.   Also recommended cutting down dexamethasone by 1mg96mekly, starting with 5mg 38mal daily dose this week.  Will defer chemotherapy until full recovery from seizure cluster/status, likely until  after MRI brain next month.  We ask that Manha A Rasnic return to clinic in 1 months following next brain MRI, with labs, or sooner as needed.  All questions were answered. The patient knows to call the clinic with any problems, questions or concerns. No barriers to learning were detected.  The total time spent in the encounter was 40 minutes and more  than 50% was on counseling and review of test results   [name], MD Medical Director of Neuro-Oncology [facility] 02/12/21 12:21 PM

## 2021-02-13 ENCOUNTER — Encounter: Payer: Self-pay | Admitting: Licensed Clinical Social Worker

## 2021-02-13 NOTE — Progress Notes (Signed)
[place] Work  Clinical Social Work was referred by Therapist, sports for questions regarding disability.  Clinical Social Worker contacted patient by phone  to offer support and assess for needs.  Patient has been receiving long term disability for a few months (out of work since October) and is now getting letters from the provider. She is not sure if they are just needing medical renewal for the LTD or if she needs to change to social security disability. CSW recommended calling the [facility] to clarify. If they are requiring her to apply for SSD, they will likely help with the process and should be able to explain if insurance will need to be done through [facility] if the LTD ends.  If they are unable to help with the application process, patient will call CSW and CSW will refer to the [facility].      [place], [place] Worker [employer]

## 2021-02-14 ENCOUNTER — Other Ambulatory Visit (HOSPITAL_BASED_OUTPATIENT_CLINIC_OR_DEPARTMENT_OTHER): Payer: Self-pay

## 2021-02-20 ENCOUNTER — Other Ambulatory Visit (HOSPITAL_BASED_OUTPATIENT_CLINIC_OR_DEPARTMENT_OTHER): Payer: Self-pay

## 2021-02-20 MED FILL — Lacosamide Tab 100 MG: ORAL | 30 days supply | Qty: 60 | Fill #0 | Status: CN

## 2021-02-23 ENCOUNTER — Other Ambulatory Visit (HOSPITAL_BASED_OUTPATIENT_CLINIC_OR_DEPARTMENT_OTHER): Payer: Self-pay

## 2021-02-23 MED FILL — Lacosamide Tab 100 MG: ORAL | 30 days supply | Qty: 60 | Fill #0 | Status: AC

## 2021-03-02 ENCOUNTER — Other Ambulatory Visit: Payer: Self-pay | Admitting: Radiation Therapy

## 2021-03-02 DIAGNOSIS — C719 Malignant neoplasm of brain, unspecified: Secondary | ICD-10-CM | POA: Diagnosis not present

## 2021-03-07 ENCOUNTER — Other Ambulatory Visit (HOSPITAL_BASED_OUTPATIENT_CLINIC_OR_DEPARTMENT_OTHER): Payer: Self-pay

## 2021-03-09 ENCOUNTER — Ambulatory Visit
Admission: RE | Admit: 2021-03-09 | Discharge: 2021-03-09 | Disposition: A | Discharge status: Home / Self Care | Payer: 59 | Source: Ambulatory Visit | Attending: Internal Medicine | Admitting: Internal Medicine

## 2021-03-09 ENCOUNTER — Other Ambulatory Visit: Payer: Self-pay

## 2021-03-09 DIAGNOSIS — G939 Disorder of brain, unspecified: Secondary | ICD-10-CM | POA: Diagnosis not present

## 2021-03-09 DIAGNOSIS — C719 Malignant neoplasm of brain, unspecified: Secondary | ICD-10-CM | POA: Diagnosis not present

## 2021-03-09 MED ORDER — GADOBENATE DIMEGLUMINE 529 MG/ML IV SOLN
10.0000 mL | Freq: Once | INTRAVENOUS | Status: AC | PRN
  Administered 2021-03-09: 10 mL via INTRAVENOUS

## 2021-03-12 ENCOUNTER — Inpatient Hospital Stay: Payer: 59 | Attending: Internal Medicine

## 2021-03-12 DIAGNOSIS — Z800 Family history of malignant neoplasm of digestive organs: Secondary | ICD-10-CM | POA: Insufficient documentation

## 2021-03-12 DIAGNOSIS — Z7901 Long term (current) use of anticoagulants: Secondary | ICD-10-CM | POA: Insufficient documentation

## 2021-03-12 DIAGNOSIS — Z833 Family history of diabetes mellitus: Secondary | ICD-10-CM | POA: Insufficient documentation

## 2021-03-12 DIAGNOSIS — Z7952 Long term (current) use of systemic steroids: Secondary | ICD-10-CM | POA: Insufficient documentation

## 2021-03-12 DIAGNOSIS — Z8249 Family history of ischemic heart disease and other diseases of the circulatory system: Secondary | ICD-10-CM | POA: Insufficient documentation

## 2021-03-12 DIAGNOSIS — Z79899 Other long term (current) drug therapy: Secondary | ICD-10-CM | POA: Insufficient documentation

## 2021-03-12 DIAGNOSIS — Z5112 Encounter for antineoplastic immunotherapy: Secondary | ICD-10-CM | POA: Insufficient documentation

## 2021-03-12 DIAGNOSIS — Z87891 Personal history of nicotine dependence: Secondary | ICD-10-CM | POA: Insufficient documentation

## 2021-03-12 DIAGNOSIS — C711 Malignant neoplasm of frontal lobe: Secondary | ICD-10-CM | POA: Insufficient documentation

## 2021-03-12 DIAGNOSIS — Z923 Personal history of irradiation: Secondary | ICD-10-CM | POA: Insufficient documentation

## 2021-03-12 DIAGNOSIS — Z9221 Personal history of antineoplastic chemotherapy: Secondary | ICD-10-CM | POA: Insufficient documentation

## 2021-03-12 DIAGNOSIS — G40909 Epilepsy, unspecified, not intractable, without status epilepticus: Secondary | ICD-10-CM | POA: Insufficient documentation

## 2021-03-15 ENCOUNTER — Other Ambulatory Visit: Payer: Self-pay

## 2021-03-15 ENCOUNTER — Other Ambulatory Visit (HOSPITAL_BASED_OUTPATIENT_CLINIC_OR_DEPARTMENT_OTHER): Payer: Self-pay

## 2021-03-15 ENCOUNTER — Inpatient Hospital Stay: Payer: 59

## 2021-03-15 ENCOUNTER — Inpatient Hospital Stay (HOSPITAL_BASED_OUTPATIENT_CLINIC_OR_DEPARTMENT_OTHER): Payer: 59 | Admitting: Internal Medicine

## 2021-03-15 VITALS — BP 120/82 | HR 57 | Temp 97.9°F | Resp 17 | Ht 65.0 in | Wt 143.4 lb

## 2021-03-15 DIAGNOSIS — Z9221 Personal history of antineoplastic chemotherapy: Secondary | ICD-10-CM | POA: Diagnosis not present

## 2021-03-15 DIAGNOSIS — Z79899 Other long term (current) drug therapy: Secondary | ICD-10-CM | POA: Diagnosis not present

## 2021-03-15 DIAGNOSIS — Z7901 Long term (current) use of anticoagulants: Secondary | ICD-10-CM | POA: Diagnosis not present

## 2021-03-15 DIAGNOSIS — C719 Malignant neoplasm of brain, unspecified: Secondary | ICD-10-CM

## 2021-03-15 DIAGNOSIS — Z5112 Encounter for antineoplastic immunotherapy: Secondary | ICD-10-CM | POA: Diagnosis not present

## 2021-03-15 DIAGNOSIS — G40909 Epilepsy, unspecified, not intractable, without status epilepticus: Secondary | ICD-10-CM | POA: Diagnosis not present

## 2021-03-15 DIAGNOSIS — Z8249 Family history of ischemic heart disease and other diseases of the circulatory system: Secondary | ICD-10-CM | POA: Diagnosis not present

## 2021-03-15 DIAGNOSIS — Z833 Family history of diabetes mellitus: Secondary | ICD-10-CM | POA: Diagnosis not present

## 2021-03-15 DIAGNOSIS — C711 Malignant neoplasm of frontal lobe: Secondary | ICD-10-CM | POA: Diagnosis not present

## 2021-03-15 DIAGNOSIS — Z923 Personal history of irradiation: Secondary | ICD-10-CM | POA: Diagnosis not present

## 2021-03-15 DIAGNOSIS — Z7952 Long term (current) use of systemic steroids: Secondary | ICD-10-CM | POA: Diagnosis not present

## 2021-03-15 DIAGNOSIS — Z87891 Personal history of nicotine dependence: Secondary | ICD-10-CM | POA: Diagnosis not present

## 2021-03-15 DIAGNOSIS — Z800 Family history of malignant neoplasm of digestive organs: Secondary | ICD-10-CM | POA: Diagnosis not present

## 2021-03-15 LAB — CMP (CANCER CENTER ONLY)
ALT: 38 U/L (ref 0–44)
AST: 22 U/L (ref 15–41)
Albumin: 3.5 g/dL (ref 3.5–5.0)
Alkaline Phosphatase: 42 U/L (ref 38–126)
Anion gap: 8 (ref 5–15)
BUN: 22 mg/dL — ABNORMAL HIGH (ref 6–20)
CO2: 31 mmol/L (ref 22–32)
Calcium: 9.4 mg/dL (ref 8.9–10.3)
Chloride: 105 mmol/L (ref 98–111)
Creatinine: 0.86 mg/dL (ref 0.44–1.00)
GFR, Estimated: >60 mL/min (ref >60)
Glucose, Bld: 90 mg/dL (ref 70–99)
Potassium: 4 mmol/L (ref 3.5–5.1)
Sodium: 144 mmol/L (ref 135–145)
Total Bilirubin: 0.4 mg/dL (ref 0.3–1.2)
Total Protein: 6.3 g/dL — ABNORMAL LOW (ref 6.5–8.1)

## 2021-03-15 LAB — CBC WITH DIFFERENTIAL (CANCER CENTER ONLY)
Abs Immature Granulocytes: 0.09 10*3/uL — ABNORMAL HIGH (ref 0.00–0.07)
Basophils Absolute: 0 10*3/uL (ref 0.0–0.1)
Basophils Relative: 0 %
Eosinophils Absolute: 0 10*3/uL (ref 0.0–0.5)
Eosinophils Relative: 0 %
HCT: 44.6 % (ref 36.0–46.0)
Hemoglobin: 15 g/dL (ref 12.0–15.0)
Immature Granulocytes: 1 %
Lymphocytes Relative: 8 %
Lymphs Abs: 0.6 10*3/uL — ABNORMAL LOW (ref 0.7–4.0)
MCH: 32.9 pg (ref 26.0–34.0)
MCHC: 33.6 g/dL (ref 30.0–36.0)
MCV: 97.8 fL (ref 80.0–100.0)
Monocytes Absolute: 0.6 10*3/uL (ref 0.1–1.0)
Monocytes Relative: 7 %
Neutro Abs: 6.2 10*3/uL (ref 1.7–7.7)
Neutrophils Relative %: 84 %
Platelet Count: 184 10*3/uL (ref 150–400)
RBC: 4.56 MIL/uL (ref 3.87–5.11)
RDW: 12.3 % (ref 11.5–15.5)
WBC Count: 7.5 10*3/uL (ref 4.0–10.5)
nRBC: 0 % (ref 0.0–0.2)

## 2021-03-15 MED ORDER — APIXABAN 5 MG PO TABS
ORAL_TABLET | Freq: Two times a day (BID) | ORAL | 3 refills | Status: DC
  Filled 2021-03-15: qty 60, 30d supply, fill #0
  Filled 2021-04-12: qty 60, 30d supply, fill #1
  Filled 2021-05-14: qty 60, 30d supply, fill #2
  Filled 2021-06-13: qty 60, 30d supply, fill #3

## 2021-03-15 MED ORDER — DEXAMETHASONE 1 MG PO TABS
1.0000 mg | ORAL_TABLET | Freq: Every day | ORAL | 1 refills | Status: DC
  Filled 2021-03-15: qty 60, 60d supply, fill #0

## 2021-03-15 MED ORDER — LACOSAMIDE 100 MG PO TABS
ORAL_TABLET | ORAL | 3 refills | Status: DC
  Filled 2021-03-15 – 2021-03-24 (×2): qty 90, 30d supply, fill #0

## 2021-03-15 NOTE — Progress Notes (Signed)
[facility] [place], [place] [address] [phone]   Interval Evaluation  Date of Service: 03/15/21 Patient Name: Hannah [name] Patient MRN: [id number] Patient DOB: 1961/04/13 Provider: [name], MD  Identifying Statement:  Hannah [name] is a 60 y.o. female with left frontal glioblastoma   Oncologic History: Oncology History  GBM (glioblastoma multiforme) ([place])   Initial Diagnosis   GBM (glioblastoma multiforme) ([place])   02/17/2020 Surgery   Craniotomy, left frontal resection by Dr. Marcello Moores.  Path is glioblastoma.   08/28/2020 - 10/09/2020 Radiation Therapy   Radiation and concurrent Temozolomide 32m/m2 daily   11/10/2020 -  Chemotherapy   Initiates cycle #1 5-day Temozolomide      [facility]   [facility] for [facility], undergoes thrombectomy and IVC filter placement     Biomarkers:  MGMT Methylated.  IDH 1/2 Wild type.  EGFR Unknown  TERT Unknown   Interval History:  Hannah [name] presents today for follow up after recent MRI brain.  She describes return of some seizure events since discontinuing Keppra.  They have been several times per week, always at night, consistent with right sided involuntary movements.  Currently taking the Vimpat 1059mtwice per day as directed.  She continues to be "quite weak in the legs, worse than before".  Continues to use the safety belt for walking and/or walker or wheelchair at times.  Decadron dosing at 68m6mn AM, 1.5mg70m PM.  Decadron 11/09/20: 2mg 368m10/22: 8mg 033m1/22: 6mg 0350m/22: 4mg 03/84m22: 8mg 04/154m2: 6mg 05/1264m: 3mg  H+P (33m8/21) Patient presented one month ago with sudden onset left arm and leg weakness and interrupted speech, [name], [name], stroke eval and CNS imaging which demonstrated a  non-enhancing left frontal mass.  At this time she is back to baseline without recurrence of events, taking Keppra 500mg twice 46mday.  No history or seizure or any other neurologic events.  She does describe being sleep deprived prior to seizure event.  She presents today after one month follow up MRI scan.  Medications: Current Outpatient Medications on File Prior to Visit  Medication Sig Dispense Refill  . apixaban (ELIQUIS) 5 MG TABS tablet TAKE 1 TABLET (5 MG TOTAL) BY MOUTH 2 (TWO) TIMES DAILY. 60 tablet 3  . cholecalciferol (VITAMIN D3) 25 MCG (1000 UNIT) tablet Take 1,000 Units by mouth daily.    . dexamethasMarland Kitchenne (DECADRON) 1 MG tablet TAKE 4 TABLETS BY MOUTH DAILY 120 tablet 1  . docusate sodium (COLACE) 100 MG capsule Take 100 mg by mouth 2 (two) times daily as needed for mild constipation.    . Lacosamide 100 MG TABS TAKE 1 TABLET BY MOUTH IN THE MORNING AND AT BEDTIME. 60 tablet 3  . levETIRAcetam (KEPPRA) 500 MG tablet TAKE 3 TABLETS (1,500 MG TOTAL) BY MOUTH 2 (TWO) TIMES DAILY. 150 tablet 3  . LORazepam (ATIVAN) 0.5 MG tablet PLACE 1 TABLET (0.5 MG TOTAL) UNDER THE TONGUE EVERY 6 (SIX) HOURS AS NEEDED FOR ANXIETY. (Patient not taking: No sig reported) 45 tablet 0  . ondansetron (ZOFRAN) 8 MG tablet TAKE 1 TABLET BY MOUTH TWICE DAILY AS NEEDED FOR NAUSEA AND/OR VOMITING. MAY TAKE 30 TO 60 MINUTES PRIOR TO TEMODAR ADMINISTRATION IF NEEDED (Patient not taking: Reported on 02/12/2021) 30 tablet 0  .  temozolomide (TEMODAR) 100 MG capsule TAKE 1 CAPSULE (100 MG TOTAL) BY MOUTH DAILY. MAY TAKE ON AN EMPTY STOMACH TO DECREASE NAUSEA & VOMITING. (Patient not taking: No sig reported) 42 capsule 0  . temozolomide (TEMODAR) 20 MG capsule TAKE 1 CAPSULE (20 MG TOTAL) BY MOUTH DAILY. MAY TAKE ON AN EMPTY STOMACH TO DECREASE NAUSEA & VOMITING. (Patient not taking: Reported on 02/12/2021) 42 capsule 0  . [DISCONTINUED] lamoTRIgine (LAMICTAL) 100 MG tablet Take 1 tablet (100 mg total) by mouth daily. 30  tablet 3   No current facility-administered medications on file prior to visit.    Allergies: Not on File Past Medical History:  Past Medical History:  Diagnosis Date  . Cervical spondylolysis 12/15/2019   Skeleton: Mild cervical spondylosis C6-7. Incidental find.   . Colon polyp   . GBM (glioblastoma multiforme) ([place])   . Left ankle sprain 10/07/2011  . Seizure ([place])    partial   . Tick bite 02/2020   Past Surgical History:  Past Surgical History:  Procedure Laterality Date  . APPLICATION OF CRANIAL NAVIGATION Left 02/16/2020   Procedure: APPLICATION OF CRANIAL NAVIGATION;  Surgeon: Vallarie Mare, MD;  Location: [facility];  Service: Neurosurgery;  Laterality: Left;  . CRANIOTOMY Left 02/16/2020   Procedure: LEFT FRONTAL CRANIOTOMY FOR BRAIN TUMOR;  Surgeon: Vallarie Mare, MD;  Location: [facility];  Service: Neurosurgery;  Laterality: Left;  . FOOT SURGERY Left    bone spur - local anesthesia  . IR ANGIOGRAM PULMONARY BILATERAL SELECTIVE  12/15/2020  . IR ANGIOGRAM SELECTIVE EACH ADDITIONAL VESSEL  12/15/2020  . IR ANGIOGRAM SELECTIVE EACH ADDITIONAL VESSEL  12/15/2020  . IR IVC FILTER PLMT / S&I /IMG GUID/MOD SED  12/15/2020  . IR THROMBECT PRIM MECH INIT (INCLU) MOD SED  12/15/2020  . IR THROMBECT PRIM MECH INIT (INCLU) MOD SED  12/15/2020  . RADIOLOGY WITH ANESTHESIA N/A 12/15/2020   Procedure: IR WITH ANESTHESIA;  Surgeon: Radiologist, Medication, MD;  Location: [facility];  Service: Radiology;  Laterality: N/A;  . TONSILECTOMY/ADENOIDECTOMY WITH MYRINGOTOMY     tonsils and adenoids out only  . WISDOM TOOTH EXTRACTION     Social History:  Social History   Socioeconomic History  . Marital status: Married    Spouse name: Not on file  . Number of children: 3  . Years of education: Not on file  . Highest education level: Not on file  Occupational History  . Not on file  Tobacco Use  . Smoking status: Former Smoker    Years: 2.00    Types: Cigarettes    Quit date: 11/05/1979     Years since quitting: 41.3  . Smokeless tobacco: Never Used  . Tobacco comment: 1/2 pack a week  Vaping Use  . Vaping Use: Never used  Substance and Sexual Activity  . Alcohol use: Yes    Alcohol/week: 0.0 standard drinks    Comment: 1 glass of wine weekly  . Drug use: No  . Sexual activity: Yes    Birth control/protection: Surgical  Other Topics Concern  . Not on file  Social History Narrative  . Not on file   Social Determinants of Health   Financial Resource Strain: Not on file  Food Insecurity: Not on file  Transportation Needs: Not on file  Physical Activity: Not on file  Stress: Not on file  Social Connections: Not on file  Intimate Partner Violence: Not on file   Family History:  Family History  Problem Relation Age of  Onset  . Hypertension Mother   . Stroke Mother   . Diabetes Mother   . Colon cancer Father 17  . Hypertension Sister   . Colon polyps Sister   . Heart disease Brother 56  . Diabetes Maternal Uncle   . Diabetes Maternal Uncle     Review of Systems: Constitutional: Doesn't report fevers, chills or abnormal weight loss Eyes: Doesn't report blurriness of vision Ears, nose, mouth, throat, and face: Doesn't report sore throat Respiratory: Chest wall pain Cardiovascular: Doesn't report palpitation, chest discomfort  Gastrointestinal:  Doesn't report nausea, constipation, diarrhea GU: Doesn't report incontinence Skin: Doesn't report skin rashes Neurological: Per HPI Musculoskeletal: Doesn't report joint pain Behavioral/Psych: Doesn't report anxiety  Physical Exam: Vitals:   03/15/21 0952  BP: 120/82  Pulse: (!) 57  Resp: 17  Temp: 97.9 F (36.6 C)  SpO2: 100%   KPS: 60. General: Alert, cooperative, pleasant, in no acute distress Head: Normal EENT: No conjunctival injection or scleral icterus.  Lungs: Resp effort normal Cardiac: Regular rate Abdomen: Non-distended abdomen Skin: No rashes cyanosis or petechiae. Extremities: No  clubbing or edema  Neurologic Exam: Mental Status: Awake, alert, attentive to examiner. Oriented to self and environment. Language is fluent with intact comprehension.  Occasional interruptions in fluency.  Cranial Nerves: Visual acuity is grossly normal. Visual fields are full. Extra-ocular movements intact. No ptosis. Face is symmetric Motor: Tone and bulk are normal. Right leg 4/5, left leg 4+/5, proximal greater than distal weakness. Reflexes are symmetric, no pathologic reflexes present.  Sensory: Intact to light touch Gait: Deferred today  Labs: I have reviewed the data as listed    Component Value Date/Time   NA 140 12/25/2020 1422   K 4.3 12/25/2020 1422   CL 104 12/25/2020 1422   CO2 26 12/25/2020 1422   GLUCOSE 121 (H) 12/25/2020 1422   GLUCOSE 85 10/13/2006 1158   BUN 19 12/25/2020 1422   CREATININE 0.83 12/25/2020 1422   CALCIUM 9.1 12/25/2020 1422   PROT 6.5 12/25/2020 1422   ALBUMIN 3.8 12/25/2020 1422   AST 14 (L) 12/25/2020 1422   ALT 22 12/25/2020 1422   ALKPHOS 56 12/25/2020 1422   BILITOT 0.3 12/25/2020 1422   GFRNONAA >60 12/25/2020 1422   GFRAA >60 02/17/2020 0136   Lab Results  Component Value Date   WBC 7.5 03/15/2021   NEUTROABS 6.2 03/15/2021   HGB 15.0 03/15/2021   HCT 44.6 03/15/2021   MCV 97.8 03/15/2021   PLT 184 03/15/2021    Imaging:  [facility] Clinician Interpretation: I have personally reviewed the CNS images as listed.  My interpretation, in the context of the patient's clinical presentation, is treatment effect vs true progression  MR BRAIN W WO CONTRAST  Result Date: 03/10/2021 CLINICAL DATA:  Glioblastoma, follow-up EXAM: MRI HEAD WITHOUT AND WITH CONTRAST TECHNIQUE: Multiplanar, multiecho pulse sequences of the brain and surrounding structures were obtained without and with intravenous contrast. 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 For example, anteroposterior measurement is presently 3.9 cm versus 3.6 cm previously. There has been a progressive increase in size over multiple prior studies with a more heterogeneous (necrotic) appearance of the enhancement. The lesion extends into the right greater than left frontal lobes. There are associated chronic blood products. There is no new abnormal enhancement remote from  this site. Extensive T2 FLAIR hyperintensity primarily in the frontoparietal white matter bilaterally is unchanged in extent compared to most recent prior. No acute infarction.  No new mass effect.  No hydrocephalus. Vascular: Major vessel flow voids at the skull base are preserved. Skull and upper cervical spine: High left craniotomy. Normal marrow signal is preserved. Sinuses/Orbits: Paranasal sinuses are aerated. Orbits are unremarkable. Other: Sella is unremarkable.  Mastoid air cells are clear. 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 This has progressively increased over multiple prior studies. Stable extent of abnormal T2 FLAIR hyperintensity. Electronically Signed   By: Macy Mis M.D.   On: 03/10/2021 08:37    Assessment/Plan GBM (glioblastoma multiforme) ([place]) - Plan: CBC with Differential (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  Her left arm pronator drift has actually improved on exam.  Seizure are not as well controlled.  We will recommend increasing Vimpat to 179m in AM, 2052min PM given these breakthrough events are all nocturnal.  She does not wish to resume Keppra because of its mood effects.  For progressive changes seen on MRI, etiology remains either  pseudo-progression or true disease progression.  Lack of nodular change, greater extent of T2 signal abnormality, "soap bubble" appearing within contrast enhancing portions are more consistent with reactive change.  She is still resistant to resume Temodar because of such poor tolerance and complications with two prior cycles (DVT/PE, status epilepticus).  We recommended initiating treatment with Avastin 1048mg IV q2 weeks.  Avastin will help treat inflammatory processes in the brain, and act as steroid sparing agent; of greater need given myopathic features noted today.  It may also help control progression of organic disease, if present.  We counseled her on side effects of Avastin, including hypertension, bleeding/clotting events, and wound healing impairment.  She is agreeable with this plan.  Avastin should be held for the following:  ANC less than 500  Platelets less than 50,000  LFT or creatinine greater than 2x ULN  If clinical concerns/contraindications develop   Can continue decadron 1mg90mg for now, until Avastin is initiated.  Will continue to defer chemotherapy in the very near term due to patient choice.    We ask that Tristina A Talamo return to clinic for first cycle of Avastin once scheduled.  All questions were answered. The patient knows to call the clinic with any problems, questions or concerns. No barriers to learning were detected.  The total time spent in the encounter was 40 minutes and more than 50% was on counseling and review of test results   ZachVentura Sellers Medical Director of Neuro-Oncology [facility]innettWeslAmity12/22 10:04 AM

## 2021-03-19 NOTE — Progress Notes (Signed)
The following biosimilar Mvasi has been selected for use in this patient  Hannah [name], PharmD

## 2021-03-22 DIAGNOSIS — Z760 Encounter for issue of repeat prescription: Secondary | ICD-10-CM | POA: Diagnosis not present

## 2021-03-23 ENCOUNTER — Telehealth: Payer: Self-pay | Admitting: Internal Medicine

## 2021-03-23 NOTE — Progress Notes (Signed)
Pharmacist Chemotherapy Monitoring - Initial Assessment    Anticipated start date: 03/27/21   Regimen:  . Are orders appropriate based on the patient's diagnosis, regimen, and cycle? Yes . Does the plan date match the patient's scheduled date? Yes . Is the sequencing of drugs appropriate? Yes . Are the premedications appropriate for the patient's regimen? Yes . Prior Authorization for treatment is: Approved o If applicable, is the correct biosimilar selected based on the patient's insurance? yes  Organ Function and Labs: Marland Kitchen Are dose adjustments needed based on the patient's renal function, hepatic function, or hematologic function? No . Are appropriate labs ordered prior to the start of patient's treatment? Yes . Other organ system assessment, if indicated: N/A . The following baseline labs, if indicated, have been ordered: bevacizumab: urine protein  Dose Assessment: . Are the drug doses appropriate? Yes . Are the following correct: o Drug concentrations Yes o IV fluid compatible with drug Yes o Administration routes Yes o Timing of therapy Yes . If applicable, does the patient have documented access for treatment and/or plans for port-a-cath placement? no . If applicable, have lifetime cumulative doses been properly documented and assessed? not applicable Lifetime Dose Tracking  No doses have been documented on this patient for the following tracked chemicals: Doxorubicin, Epirubicin, Idarubicin, Daunorubicin, Mitoxantrone, Bleomycin, Oxaliplatin, Carboplatin, Liposomal Doxorubicin  o   Toxicity Monitoring/Prevention: . The patient has the following take home antiemetics prescribed: Ondansetron . The patient has the following take home medications prescribed: N/A . Medication allergies and previous infusion related reactions, if applicable, have been reviewed and addressed. Yes . The patient's current medication list has been assessed for drug-drug interactions with their  chemotherapy regimen. no significant drug-drug interactions were identified on review.  Order Review: . Are the treatment plan orders signed? Yes . Is the patient scheduled to see a provider prior to their treatment? Yes  I verify that I have reviewed each item in the above checklist and answered each question accordingly.   Kennith Center, Pharm.D., CPP 03/23/2021@1 :08 PM

## 2021-03-23 NOTE — Telephone Encounter (Signed)
Scheduled appts per 5/16 sch msg. Called pt, no answer. Left msg with appts dates and times. I had to split the appts into two days due to limited availability. I let the pt know this in the msg and also left my number if she wanted to reach out with any questions.

## 2021-03-26 ENCOUNTER — Other Ambulatory Visit: Payer: Self-pay

## 2021-03-26 ENCOUNTER — Inpatient Hospital Stay: Payer: 59

## 2021-03-26 ENCOUNTER — Inpatient Hospital Stay (HOSPITAL_BASED_OUTPATIENT_CLINIC_OR_DEPARTMENT_OTHER): Payer: 59 | Admitting: Internal Medicine

## 2021-03-26 ENCOUNTER — Other Ambulatory Visit (HOSPITAL_BASED_OUTPATIENT_CLINIC_OR_DEPARTMENT_OTHER): Payer: Self-pay

## 2021-03-26 VITALS — BP 119/95 | HR 56 | Temp 96.8°F | Resp 18 | Ht 65.0 in | Wt 139.4 lb

## 2021-03-26 DIAGNOSIS — C719 Malignant neoplasm of brain, unspecified: Secondary | ICD-10-CM | POA: Diagnosis not present

## 2021-03-26 DIAGNOSIS — Z7901 Long term (current) use of anticoagulants: Secondary | ICD-10-CM | POA: Diagnosis not present

## 2021-03-26 DIAGNOSIS — G40909 Epilepsy, unspecified, not intractable, without status epilepticus: Secondary | ICD-10-CM | POA: Diagnosis not present

## 2021-03-26 DIAGNOSIS — C711 Malignant neoplasm of frontal lobe: Secondary | ICD-10-CM | POA: Diagnosis not present

## 2021-03-26 DIAGNOSIS — Z923 Personal history of irradiation: Secondary | ICD-10-CM | POA: Diagnosis not present

## 2021-03-26 DIAGNOSIS — Z7952 Long term (current) use of systemic steroids: Secondary | ICD-10-CM | POA: Diagnosis not present

## 2021-03-26 DIAGNOSIS — Z87891 Personal history of nicotine dependence: Secondary | ICD-10-CM | POA: Diagnosis not present

## 2021-03-26 DIAGNOSIS — Z5112 Encounter for antineoplastic immunotherapy: Secondary | ICD-10-CM | POA: Diagnosis not present

## 2021-03-26 DIAGNOSIS — Z9221 Personal history of antineoplastic chemotherapy: Secondary | ICD-10-CM | POA: Diagnosis not present

## 2021-03-26 DIAGNOSIS — Z79899 Other long term (current) drug therapy: Secondary | ICD-10-CM | POA: Diagnosis not present

## 2021-03-26 LAB — CBC WITH DIFFERENTIAL (CANCER CENTER ONLY)
Abs Immature Granulocytes: 0.04 10*3/uL (ref 0.00–0.07)
Basophils Absolute: 0 10*3/uL (ref 0.0–0.1)
Basophils Relative: 0 %
Eosinophils Absolute: 0.1 10*3/uL (ref 0.0–0.5)
Eosinophils Relative: 1 %
HCT: 42.3 % (ref 36.0–46.0)
Hemoglobin: 14.5 g/dL (ref 12.0–15.0)
Immature Granulocytes: 1 %
Lymphocytes Relative: 8 %
Lymphs Abs: 0.5 10*3/uL — ABNORMAL LOW (ref 0.7–4.0)
MCH: 32.6 pg (ref 26.0–34.0)
MCHC: 34.3 g/dL (ref 30.0–36.0)
MCV: 95.1 fL (ref 80.0–100.0)
Monocytes Absolute: 0.6 10*3/uL (ref 0.1–1.0)
Monocytes Relative: 9 %
Neutro Abs: 5.1 10*3/uL (ref 1.7–7.7)
Neutrophils Relative %: 81 %
Platelet Count: 164 10*3/uL (ref 150–400)
RBC: 4.45 MIL/uL (ref 3.87–5.11)
RDW: 11.9 % (ref 11.5–15.5)
WBC Count: 6.3 10*3/uL (ref 4.0–10.5)
nRBC: 0 % (ref 0.0–0.2)

## 2021-03-26 LAB — CMP (CANCER CENTER ONLY)
ALT: 21 U/L (ref 0–44)
AST: 18 U/L (ref 15–41)
Albumin: 3.7 g/dL (ref 3.5–5.0)
Alkaline Phosphatase: 46 U/L (ref 38–126)
Anion gap: 7 (ref 5–15)
BUN: 16 mg/dL (ref 6–20)
CO2: 30 mmol/L (ref 22–32)
Calcium: 9.5 mg/dL (ref 8.9–10.3)
Chloride: 105 mmol/L (ref 98–111)
Creatinine: 0.92 mg/dL (ref 0.44–1.00)
GFR, Estimated: >60 mL/min (ref >60)
Glucose, Bld: 88 mg/dL (ref 70–99)
Potassium: 4.1 mmol/L (ref 3.5–5.1)
Sodium: 142 mmol/L (ref 135–145)
Total Bilirubin: 0.6 mg/dL (ref 0.3–1.2)
Total Protein: 6.6 g/dL (ref 6.5–8.1)

## 2021-03-26 LAB — TOTAL PROTEIN, URINE DIPSTICK: Protein, ur: NEGATIVE mg/dL

## 2021-03-26 MED ORDER — LEVETIRACETAM 500 MG PO TABS
1000.0000 mg | ORAL_TABLET | Freq: Two times a day (BID) | ORAL | 2 refills | Status: DC
  Filled 2021-03-26: qty 120, 30d supply, fill #0

## 2021-03-26 NOTE — Progress Notes (Signed)
[facility] [place],  [address] [phone]   Interval Evaluation  Date of Service: 03/26/21 Patient Name: Hannah [name] Patient MRN: [id number] Patient DOB: [birth year] Provider: [name], MD  Identifying Statement:  Hannah [name] is a 60 y.o. female with left frontal glioblastoma   Oncologic History: Oncology History  GBM (glioblastoma multiforme) ([place])   Initial Diagnosis   GBM (glioblastoma multiforme) ([place])   02/17/2020 Surgery   Craniotomy, left frontal resection by Dr. Marcello Moores.  Path is glioblastoma.   08/28/2020 - 10/09/2020 Radiation Therapy   Radiation and concurrent Temozolomide 529m/m2 daily   11/10/2020 -  Chemotherapy   Initiates cycle #1 5-day Temozolomide      [facility]   [facility] for [facility], undergoes thrombectomy and IVC filter placement   03/27/2021 -  Chemotherapy    Patient is on Treatment Plan: BRAIN GLIOBLASTOMA RADIATION THERAPY WITH CONCURRENT TEMOZOLOMIDE 75 MG/M2 DAILY FOLLOWED BY SEQUENTIAL MAINTENANCE TEMOZOLOMIDE X 6-12 CYCLES   Patient is on Antibody Plan: BRAIN GBM BEVACIZUMAB 14D X 6 CYCLES      Biomarkers:  MGMT Methylated.  IDH 1/2 Wild type.  EGFR Unknown  TERT Unknown   Interval History:  [name] presents today for initial avastin infusion.  No recurrence of seizure events since increasing Vimpat to 10829m200mg.  No improvement in leg weakness, noted prior.  Continues to use the safety belt for walking and/or walker or wheelchair at times.  Dosing 29m65mecadron total daily.  Decadron 11/09/20: 29mg33m/10/22: 8mg 46m21/22: 6mg 056m7/22: 4mg 0363m/22: 8mg 04/45m22: 6mg 05/1429m2: 3mg 05.2323m: 29mg  H+P (7m8/21) Patient presented one month ago with sudden onset left arm and leg weakness and interrupted speech, [name], noticed poor grip on racket and could not express herself  verbally.  This led to ED visit, stroke eval and CNS imaging which demonstrated a non-enhancing left frontal mass.  At this time she is back to baseline without recurrence of events, taking Keppra 500mg twice 56mday.  No history or seizure or any other neurologic events.  She does describe being sleep deprived prior to seizure event.  She presents today after one month follow up MRI scan.  Medications: Current Outpatient Medications on File Prior to Visit  Medication Sig Dispense Refill  . apixaban (ELIQUIS) 5 MG TABS tablet TAKE 1 TABLET (5 MG TOTAL) BY MOUTH 2 (TWO) TIMES DAILY. 60 tablet 3  . cholecalciferol (VITAMIN D3) 25 MCG (1000 UNIT) tablet Take 1,000 Units by mouth daily.    . dexamethasMarland Kitchenne (DECADRON) 1 MG tablet Take 1 tablet (1 mg total) by mouth daily. 60 tablet 1  . docusate sodium (COLACE) 100 MG capsule Take 100 mg by mouth 2 (two) times daily as needed for mild constipation.    . Lacosamide 100 MG TABS Take 1 tablet by mouth in the AM (100mg) and 2 36mets by mouth at bedtime (200mg) 90 tabl29m  . LORazepam (ATIVAN) 0.5 MG tablet PLACE 1 TABLET (0.5 MG TOTAL) UNDER THE TONGUE EVERY 6 (SIX) HOURS AS NEEDED FOR ANXIETY. (Patient not taking: No sig reported) 45 tablet 0  . ondansetron (ZOFRAN) 8 MG tablet TAKE 1 TABLET BY MOUTH TWICE DAILY AS NEEDED FOR NAUSEA AND/OR VOMITING. MAY TAKE 30 TO 60 MINUTES PRIOR TO TEMODAR ADMINISTRATION IF NEEDED (Patient not taking: No sig reported) 30 tablet 0  . temozolomide (TEMODAR) 100 MG  capsule TAKE 1 CAPSULE (100 MG TOTAL) BY MOUTH DAILY. MAY TAKE ON AN EMPTY STOMACH TO DECREASE NAUSEA & VOMITING. (Patient not taking: No sig reported) 42 capsule 0  . temozolomide (TEMODAR) 20 MG capsule TAKE 1 CAPSULE (20 MG TOTAL) BY MOUTH DAILY. MAY TAKE ON AN EMPTY STOMACH TO DECREASE NAUSEA & VOMITING. (Patient not taking: No sig reported) 42 capsule 0  . [DISCONTINUED] lamoTRIgine (LAMICTAL) 100 MG tablet Take 1 tablet (100 mg total) by mouth daily. 30 tablet  3   No current facility-administered medications on file prior to visit.    Allergies: No Known Allergies Past Medical History:  Past Medical History:  Diagnosis Date  . Cervical spondylolysis 12/15/2019   Skeleton: Mild cervical spondylosis C6-7. Incidental find.   . Colon polyp   . GBM (glioblastoma multiforme) ([facility])   . Left ankle sprain 10/07/2011  . Seizure ([facility])    partial   . Tick bite 02/2020   Past Surgical History:  Past Surgical History:  Procedure Laterality Date  . APPLICATION OF CRANIAL NAVIGATION Left 02/16/2020   Procedure: APPLICATION OF CRANIAL NAVIGATION;  Surgeon: Thomas, Jonathan G, MD;  Location: [facility] OR;  Service: Neurosurgery;  Laterality: Left;  . CRANIOTOMY Left 02/16/2020   Procedure: LEFT FRONTAL CRANIOTOMY FOR BRAIN TUMOR;  Surgeon: Thomas, Jonathan G, MD;  Location: [facility] OR;  Service: Neurosurgery;  Laterality: Left;  . FOOT SURGERY Left    bone spur - local anesthesia  . IR ANGIOGRAM PULMONARY BILATERAL SELECTIVE  12/15/2020  . IR ANGIOGRAM SELECTIVE EACH ADDITIONAL VESSEL  12/15/2020  . IR ANGIOGRAM SELECTIVE EACH ADDITIONAL VESSEL  12/15/2020  . IR IVC FILTER PLMT / S&I /IMG GUID/MOD SED  12/15/2020  . IR THROMBECT PRIM MECH INIT (INCLU) MOD SED  12/15/2020  . IR THROMBECT PRIM MECH INIT (INCLU) MOD SED  12/15/2020  . RADIOLOGY WITH ANESTHESIA N/A 12/15/2020   Procedure: IR WITH ANESTHESIA;  Surgeon: Radiologist, Medication, MD;  Location: [facility] OR;  Service: Radiology;  Laterality: N/A;  . TONSILECTOMY/ADENOIDECTOMY WITH MYRINGOTOMY     tonsils and adenoids out only  . WISDOM TOOTH EXTRACTION     Social History:  Social History   Socioeconomic History  . Marital status: Married    Spouse name: Not on file  . Number of children: 3  . Years of education: Not on file  . Highest education level: Not on file  Occupational History  . Not on file  Tobacco Use  . Smoking status: Former Smoker    Years: 2.00    Types: Cigarettes    Quit date: 11/05/1979     Years since quitting: 41.4  . Smokeless tobacco: Never Used  . Tobacco comment: 1/2 pack a week  Vaping Use  . Vaping Use: Never used  Substance and Sexual Activity  . Alcohol use: Yes    Alcohol/week: 0.0 standard drinks    Comment: 1 glass of wine weekly  . Drug use: No  . Sexual activity: Yes    Birth control/protection: Surgical  Other Topics Concern  . Not on file  Social History Narrative  . Not on file   Social Determinants of Health   Financial Resource Strain: Not on file  Food Insecurity: Not on file  Transportation Needs: Not on file  Physical Activity: Not on file  Stress: Not on file  Social Connections: Not on file  Intimate Partner Violence: Not on file   Family History:  Family History  Problem Relation Age of Onset  . Hypertension   Mother   . Stroke Mother   . Diabetes Mother   . Colon cancer Father 53  . Hypertension Sister   . Colon polyps Sister   . Heart disease Brother 48  . Diabetes Maternal Uncle   . Diabetes Maternal Uncle     Review of Systems: Constitutional: Doesn't report fevers, chills or abnormal weight loss Eyes: Doesn't report blurriness of vision Ears, nose, mouth, throat, and face: Doesn't report sore throat Respiratory: Chest wall pain Cardiovascular: Doesn't report palpitation, chest discomfort  Gastrointestinal:  Doesn't report nausea, constipation, diarrhea GU: Doesn't report incontinence Skin: Doesn't report skin rashes Neurological: Per HPI Musculoskeletal: Doesn't report joint pain Behavioral/Psych: Doesn't report anxiety  Physical Exam: Vitals:   03/26/21 1105  BP: (!) 119/95  Pulse: (!) 56  Resp: 18  Temp: (!) 96.8 F (36 C)  SpO2: 99%   KPS: 60. General: Alert, cooperative, pleasant, in no acute distress Head: Normal EENT: No conjunctival injection or scleral icterus.  Lungs: Resp effort normal Cardiac: Regular rate Abdomen: Non-distended abdomen Skin: No rashes cyanosis or petechiae. Extremities: No  clubbing or edema  Neurologic Exam: Mental Status: Awake, alert, attentive to examiner. Oriented to self and environment. Language is fluent with intact comprehension.  Occasional interruptions in fluency.  Cranial Nerves: Visual acuity is grossly normal. Visual fields are full. Extra-ocular movements intact. No ptosis. Face is symmetric Motor: Tone and bulk are normal. Right leg 4/5, left leg 4+/5, proximal greater than distal weakness. Reflexes are symmetric, no pathologic reflexes present.  Sensory: Intact to light touch Gait: Deferred today  Labs: I have reviewed the data as listed    Component Value Date/Time   NA 142 03/26/2021 1030   K 4.1 03/26/2021 1030   CL 105 03/26/2021 1030   CO2 30 03/26/2021 1030   GLUCOSE 88 03/26/2021 1030   GLUCOSE 85 10/13/2006 1158   BUN 16 03/26/2021 1030   CREATININE 0.92 03/26/2021 1030   CALCIUM 9.5 03/26/2021 1030   PROT 6.6 03/26/2021 1030   ALBUMIN 3.7 03/26/2021 1030   AST 18 03/26/2021 1030   ALT 21 03/26/2021 1030   ALKPHOS 46 03/26/2021 1030   BILITOT 0.6 03/26/2021 1030   GFRNONAA >60 03/26/2021 1030   GFRAA >60 02/17/2020 0136   Lab Results  Component Value Date   WBC 6.3 03/26/2021   NEUTROABS 5.1 03/26/2021   HGB 14.5 03/26/2021   HCT 42.3 03/26/2021   MCV 95.1 03/26/2021   PLT 164 03/26/2021    Assessment/Plan GBM (glioblastoma multiforme) ([facility])  Seizure disorder ([facility])  Coy A Ingwersen is clinically stable today, cleared to dose 1st cycle of Avastin.  Labs are within normal limits.  Vimpat should continue at 100mg/200mg or 150/150, pending insurance auth.  We recommended initiating treatment with Avastin 10mg/kg IV q2 weeks.  Avastin will help treat inflammatory processes in the brain, and act as steroid sparing agent; of greater need given myopathic features noted today.  It may also help control progression of organic disease, if present.  We counseled her on side effects of Avastin, including hypertension,  bleeding/clotting events, and wound healing impairment.  She is agreeable with this plan.  Avastin should be held for the following:  ANC less than 500  Platelets less than 50,000  LFT or creatinine greater than 2x ULN  If clinical concerns/contraindications develop   Decadron should decrease to 1mg daily if tolerated.  Will continue to defer chemotherapy in the very near term due to patient choice.    We   ask that Melis A Givans return to clinic in 2 weeks for next infusion.  All questions were answered. The patient knows to call the clinic with any problems, questions or concerns. No barriers to learning were detected.  The total time spent in the encounter was 30 minutes and more than 50% was on counseling and review of test results   Zachary K Vaslow, MD Medical Director of Neuro-Oncology [facility] [place] 03/26/21 11:34 AM 

## 2021-03-27 ENCOUNTER — Ambulatory Visit: Payer: 59 | Admitting: Internal Medicine

## 2021-03-27 ENCOUNTER — Other Ambulatory Visit (HOSPITAL_BASED_OUTPATIENT_CLINIC_OR_DEPARTMENT_OTHER): Payer: Self-pay

## 2021-03-27 ENCOUNTER — Other Ambulatory Visit: Payer: 59

## 2021-03-27 ENCOUNTER — Inpatient Hospital Stay: Payer: 59

## 2021-03-27 VITALS — BP 124/94 | HR 65 | Temp 98.1°F | Resp 18

## 2021-03-27 DIAGNOSIS — Z923 Personal history of irradiation: Secondary | ICD-10-CM | POA: Diagnosis not present

## 2021-03-27 DIAGNOSIS — Z87891 Personal history of nicotine dependence: Secondary | ICD-10-CM | POA: Diagnosis not present

## 2021-03-27 DIAGNOSIS — Z5112 Encounter for antineoplastic immunotherapy: Secondary | ICD-10-CM | POA: Diagnosis not present

## 2021-03-27 DIAGNOSIS — C719 Malignant neoplasm of brain, unspecified: Secondary | ICD-10-CM

## 2021-03-27 DIAGNOSIS — Z9221 Personal history of antineoplastic chemotherapy: Secondary | ICD-10-CM | POA: Diagnosis not present

## 2021-03-27 DIAGNOSIS — G40909 Epilepsy, unspecified, not intractable, without status epilepticus: Secondary | ICD-10-CM | POA: Diagnosis not present

## 2021-03-27 DIAGNOSIS — Z7901 Long term (current) use of anticoagulants: Secondary | ICD-10-CM | POA: Diagnosis not present

## 2021-03-27 DIAGNOSIS — C711 Malignant neoplasm of frontal lobe: Secondary | ICD-10-CM | POA: Diagnosis not present

## 2021-03-27 DIAGNOSIS — Z79899 Other long term (current) drug therapy: Secondary | ICD-10-CM | POA: Diagnosis not present

## 2021-03-27 DIAGNOSIS — Z7952 Long term (current) use of systemic steroids: Secondary | ICD-10-CM | POA: Diagnosis not present

## 2021-03-27 MED ORDER — SODIUM CHLORIDE 0.9 % IV SOLN
Freq: Once | INTRAVENOUS | Status: AC
Start: 2021-03-27 — End: 2021-03-27
  Filled 2021-03-27: qty 250

## 2021-03-27 MED ORDER — SODIUM CHLORIDE 0.9 % IV SOLN
600.0000 mg | Freq: Once | INTRAVENOUS | Status: AC
  Administered 2021-03-27: 600 mg via INTRAVENOUS
  Filled 2021-03-27: qty 16

## 2021-03-27 NOTE — Patient Instructions (Addendum)
[facility]  Discharge Instructions: Thank you for choosing [facility] to provide your oncology and hematology care.   If you have a lab appointment with the [facility], please go directly to the [facility] and check in at the registration area.   Wear comfortable clothing and clothing appropriate for easy access to any Portacath or PICC line.   We strive to give you quality time with your provider. You may need to reschedule your appointment if you arrive late (15 or more minutes).  Arriving late affects you and other patients whose appointments are after yours.  Also, if you miss three or more appointments without notifying the office, you may be dismissed from the clinic at the provider's discretion.      For prescription refill requests, have your pharmacy contact our office and allow 72 hours for refills to be completed.    Today you received the following chemotherapy and/or immunotherapy agents bevacizumab   To help prevent nausea and vomiting after your treatment, we encourage you to take your nausea medication as directed.  BELOW ARE SYMPTOMS THAT SHOULD BE REPORTED IMMEDIATELY: . *FEVER GREATER THAN 100.4 F (38 C) OR HIGHER . *CHILLS OR SWEATING . *NAUSEA AND VOMITING THAT IS NOT CONTROLLED WITH YOUR NAUSEA MEDICATION . *UNUSUAL SHORTNESS OF BREATH . *UNUSUAL BRUISING OR BLEEDING . *URINARY PROBLEMS (pain or burning when urinating, or frequent urination) . *BOWEL PROBLEMS (unusual diarrhea, constipation, pain near the anus) . TENDERNESS IN MOUTH AND THROAT WITH OR WITHOUT PRESENCE OF ULCERS (sore throat, sores in mouth, or a toothache) . UNUSUAL RASH, SWELLING OR PAIN  . UNUSUAL VAGINAL DISCHARGE OR ITCHING   Items with * indicate a potential emergency and should be followed up as soon as possible or go to the Emergency Department if any problems should occur.  Please show the CHEMOTHERAPY ALERT CARD or IMMUNOTHERAPY ALERT  CARD at check-in to the Emergency Department and triage nurse.  Should you have questions after your visit or need to cancel or reschedule your appointment, please contact [facility]  Dept: [phone]  and follow the prompts.  Office hours are 8:00 a.m. to 4:30 p.m. Monday - Friday. Please note that voicemails left after 4:00 p.m. may not be returned until the following business day.  We are closed weekends and major holidays. You have access to a nurse at all times for urgent questions. Please call the main number to the clinic Dept: [phone] and follow the prompts.   For any non-urgent questions, you may also contact your provider using MyChart. We now offer e-Visits for anyone 10 and older to request care online for non-urgent symptoms. For details visit [email or web address].   Also download the MyChart app! Go to the app store, search "MyChart", open the app, select [facility], and log in with your MyChart username and password.  Due to Covid, a mask is required upon entering the hospital/clinic. If you do not have a mask, one will be given to you upon arrival. For doctor visits, patients may have 1 support person aged 89 or older with them. For treatment visits, patients cannot have anyone with them due to current Covid guidelines and our immunocompromised population.   Bevacizumab injection What is this medicine? BEVACIZUMAB (be va SIZ yoo mab) is a monoclonal antibody. It is used to treat many types of cancer. This medicine may be used for other purposes; ask your health care provider or pharmacist if you  have questions. COMMON BRAND NAME(S): Avastin, MVASI, Zirabev What should I tell my health care provider before I take this medicine? They need to know if you have any of these conditions:  diabetes  heart disease  high blood pressure  history of coughing up blood  prior anthracycline chemotherapy (e.g., doxorubicin, daunorubicin,  epirubicin)  recent or ongoing radiation therapy  recent or planning to have surgery  stroke  an unusual or allergic reaction to bevacizumab, hamster proteins, mouse proteins, other medicines, foods, dyes, or preservatives  pregnant or trying to get pregnant  breast-feeding How should I use this medicine? This medicine is for infusion into a vein. It is given by a health care professional in a hospital or clinic setting. Talk to your pediatrician regarding the use of this medicine in children. Special care may be needed. Overdosage: If you think you have taken too much of this medicine contact a poison control center or emergency room at once. NOTE: This medicine is only for you. Do not share this medicine with others. What if I miss a dose? It is important not to miss your dose. Call your doctor or health care professional if you are unable to keep an appointment. What may interact with this medicine? Interactions are not expected. This list may not describe all possible interactions. Give your health care provider a list of all the medicines, herbs, non-prescription drugs, or dietary supplements you use. Also tell them if you smoke, drink alcohol, or use illegal drugs. Some items may interact with your medicine. What should I watch for while using this medicine? Your condition will be monitored carefully while you are receiving this medicine. You will need important blood work and urine testing done while you are taking this medicine. This medicine may increase your risk to bruise or bleed. Call your doctor or health care professional if you notice any unusual bleeding. Before having surgery, talk to your health care provider to make sure it is ok. This drug can increase the risk of poor healing of your surgical site or wound. You will need to stop this drug for 28 days before surgery. After surgery, wait at least 28 days before restarting this drug. Make sure the surgical site or wound  is healed enough before restarting this drug. Talk to your health care provider if questions. Do not become pregnant while taking this medicine or for 6 months after stopping it. Women should inform their doctor if they wish to become pregnant or think they might be pregnant. There is a potential for serious side effects to an unborn child. Talk to your health care professional or pharmacist for more information. Do not breast-feed an infant while taking this medicine and for 6 months after the last dose. This medicine has caused ovarian failure in some women. This medicine may interfere with the ability to have a child. You should talk to your doctor or health care professional if you are concerned about your fertility. What side effects may I notice from receiving this medicine? Side effects that you should report to your doctor or health care professional as soon as possible:  allergic reactions like skin rash, itching or hives, swelling of the face, lips, or tongue  chest pain or chest tightness  chills  coughing up blood  high fever  seizures  severe constipation  signs and symptoms of bleeding such as bloody or black, tarry stools; red or dark-brown urine; spitting up blood or brown material that looks like coffee grounds;  red spots on the skin; unusual bruising or bleeding from the eye, gums, or nose  signs and symptoms of a blood clot such as breathing problems; chest pain; severe, sudden headache; pain, swelling, warmth in the leg  signs and symptoms of a stroke like changes in vision; confusion; trouble speaking or understanding; severe headaches; sudden numbness or weakness of the face, arm or leg; trouble walking; dizziness; loss of balance or coordination  stomach pain  sweating  swelling of legs or ankles  vomiting  weight gain Side effects that usually do not require medical attention (report to your doctor or health care professional if they continue or are  bothersome):  back pain  changes in taste  decreased appetite  dry skin  nausea  tiredness This list may not describe all possible side effects. Call your doctor for medical advice about side effects. You may report side effects to FDA at 1-800-FDA-1088. Where should I keep my medicine? This drug is given in a hospital or clinic and will not be stored at home. NOTE: This sheet is a summary. It may not cover all possible information. If you have questions about this medicine, talk to your doctor, pharmacist, or health care provider.  2021 Elsevier/Gold Standard (2019-08-18 10:50:46) [facility]  Discharge Instructions: Thank you for choosing [facility] to provide your oncology and hematology care.   If you have a lab appointment with the [facility], please go directly to the [facility] and check in at the registration area.   Wear comfortable clothing and clothing appropriate for easy access to any Portacath or PICC line.   We strive to give you quality time with your provider. You may need to reschedule your appointment if you arrive late (15 or more minutes).  Arriving late affects you and other patients whose appointments are after yours.  Also, if you miss three or more appointments without notifying the office, you may be dismissed from the clinic at the provider's discretion.      For prescription refill requests, have your pharmacy contact our office and allow 72 hours for refills to be completed.    Today you received the following chemotherapy and/or immunotherapy agents bevacizumab      To help prevent nausea and vomiting after your treatment, we encourage you to take your nausea medication as directed.  BELOW ARE SYMPTOMS THAT SHOULD BE REPORTED IMMEDIATELY: . *FEVER GREATER THAN 100.4 F (38 C) OR HIGHER . *CHILLS OR SWEATING . *NAUSEA AND VOMITING THAT IS NOT CONTROLLED WITH YOUR NAUSEA MEDICATION . *UNUSUAL  SHORTNESS OF BREATH . *UNUSUAL BRUISING OR BLEEDING . *URINARY PROBLEMS (pain or burning when urinating, or frequent urination) . *BOWEL PROBLEMS (unusual diarrhea, constipation, pain near the anus) . TENDERNESS IN MOUTH AND THROAT WITH OR WITHOUT PRESENCE OF ULCERS (sore throat, sores in mouth, or a toothache) . UNUSUAL RASH, SWELLING OR PAIN  . UNUSUAL VAGINAL DISCHARGE OR ITCHING   Items with * indicate a potential emergency and should be followed up as soon as possible or go to the Emergency Department if any problems should occur.  Please show the CHEMOTHERAPY ALERT CARD or IMMUNOTHERAPY ALERT CARD at check-in to the Emergency Department and triage nurse.  Should you have questions after your visit or need to cancel or reschedule your appointment, please contact [facility]  Dept: [phone]  and follow the prompts.  Office hours are 8:00 a.m. to 4:30 p.m. Monday - Friday.  Please note that voicemails left after 4:00 p.m. may not be returned until the following business day.  We are closed weekends and major holidays. You have access to a nurse at all times for urgent questions. Please call the main number to the clinic Dept: [phone] and follow the prompts.   For any non-urgent questions, you may also contact your provider using MyChart. We now offer e-Visits for anyone 60 and older to request care online for non-urgent symptoms. For details visit [email or web address].   Also download the MyChart app! Go to the app store, search "MyChart", open the app, select [facility], and log in with your MyChart username and password.  Due to Covid, a mask is required upon entering the hospital/clinic. If you do not have a mask, one will be given to you upon arrival. For doctor visits, patients may have 1 support person aged 14 or older with them. For treatment visits, patients cannot have anyone with them due to current Covid guidelines and our immunocompromised  population.   Bevacizumab injection What is this medicine? BEVACIZUMAB (be va SIZ yoo mab) is a monoclonal antibody. It is used to treat many types of cancer. This medicine may be used for other purposes; ask your health care provider or pharmacist if you have questions. COMMON BRAND NAME(S): Avastin, MVASI, Zirabev What should I tell my health care provider before I take this medicine? They need to know if you have any of these conditions:  diabetes  heart disease  high blood pressure  history of coughing up blood  prior anthracycline chemotherapy (e.g., doxorubicin, daunorubicin, epirubicin)  recent or ongoing radiation therapy  recent or planning to have surgery  stroke  an unusual or allergic reaction to bevacizumab, hamster proteins, mouse proteins, other medicines, foods, dyes, or preservatives  pregnant or trying to get pregnant  breast-feeding How should I use this medicine? This medicine is for infusion into a vein. It is given by a health care professional in a hospital or clinic setting. Talk to your pediatrician regarding the use of this medicine in children. Special care may be needed. Overdosage: If you think you have taken too much of this medicine contact a poison control center or emergency room at once. NOTE: This medicine is only for you. Do not share this medicine with others. What if I miss a dose? It is important not to miss your dose. Call your doctor or health care professional if you are unable to keep an appointment. What may interact with this medicine? Interactions are not expected. This list may not describe all possible interactions. Give your health care provider a list of all the medicines, herbs, non-prescription drugs, or dietary supplements you use. Also tell them if you smoke, drink alcohol, or use illegal drugs. Some items may interact with your medicine. What should I watch for while using this medicine? Your condition will be monitored  carefully while you are receiving this medicine. You will need important blood work and urine testing done while you are taking this medicine. This medicine may increase your risk to bruise or bleed. Call your doctor or health care professional if you notice any unusual bleeding. Before having surgery, talk to your health care provider to make sure it is ok. This drug can increase the risk of poor healing of your surgical site or wound. You will need to stop this drug for 28 days before surgery. After surgery, wait at least 28 days before restarting this drug. Make sure the surgical  site or wound is healed enough before restarting this drug. Talk to your health care provider if questions. Do not become pregnant while taking this medicine or for 6 months after stopping it. Women should inform their doctor if they wish to become pregnant or think they might be pregnant. There is a potential for serious side effects to an unborn child. Talk to your health care professional or pharmacist for more information. Do not breast-feed an infant while taking this medicine and for 6 months after the last dose. This medicine has caused ovarian failure in some women. This medicine may interfere with the ability to have a child. You should talk to your doctor or health care professional if you are concerned about your fertility. What side effects may I notice from receiving this medicine? Side effects that you should report to your doctor or health care professional as soon as possible:  allergic reactions like skin rash, itching or hives, swelling of the face, lips, or tongue  chest pain or chest tightness  chills  coughing up blood  high fever  seizures  severe constipation  signs and symptoms of bleeding such as bloody or black, tarry stools; red or dark-brown urine; spitting up blood or brown material that looks like coffee grounds; red spots on the skin; unusual bruising or bleeding from the eye, gums, or  nose  signs and symptoms of a blood clot such as breathing problems; chest pain; severe, sudden headache; pain, swelling, warmth in the leg  signs and symptoms of a stroke like changes in vision; confusion; trouble speaking or understanding; severe headaches; sudden numbness or weakness of the face, arm or leg; trouble walking; dizziness; loss of balance or coordination  stomach pain  sweating  swelling of legs or ankles  vomiting  weight gain Side effects that usually do not require medical attention (report to your doctor or health care professional if they continue or are bothersome):  back pain  changes in taste  decreased appetite  dry skin  nausea  tiredness This list may not describe all possible side effects. Call your doctor for medical advice about side effects. You may report side effects to FDA at 1-800-FDA-1088. Where should I keep my medicine? This drug is given in a hospital or clinic and will not be stored at home. NOTE: This sheet is a summary. It may not cover all possible information. If you have questions about this medicine, talk to your doctor, pharmacist, or health care provider.  2021 Elsevier/Gold Standard (2019-08-18 10:50:46)

## 2021-03-28 ENCOUNTER — Telehealth: Payer: Self-pay | Admitting: *Deleted

## 2021-03-28 ENCOUNTER — Other Ambulatory Visit (HOSPITAL_BASED_OUTPATIENT_CLINIC_OR_DEPARTMENT_OTHER): Payer: Self-pay

## 2021-04-01 DIAGNOSIS — C719 Malignant neoplasm of brain, unspecified: Secondary | ICD-10-CM | POA: Diagnosis not present

## 2021-04-09 ENCOUNTER — Inpatient Hospital Stay (HOSPITAL_BASED_OUTPATIENT_CLINIC_OR_DEPARTMENT_OTHER): Payer: 59 | Admitting: Internal Medicine

## 2021-04-09 ENCOUNTER — Inpatient Hospital Stay: Payer: 59

## 2021-04-09 ENCOUNTER — Other Ambulatory Visit: Payer: Self-pay

## 2021-04-09 ENCOUNTER — Inpatient Hospital Stay: Payer: 59 | Attending: Internal Medicine

## 2021-04-09 VITALS — BP 112/89 | HR 68 | Temp 97.8°F | Resp 18 | Ht 65.0 in | Wt 140.9 lb

## 2021-04-09 VITALS — BP 132/81

## 2021-04-09 DIAGNOSIS — C711 Malignant neoplasm of frontal lobe: Secondary | ICD-10-CM | POA: Insufficient documentation

## 2021-04-09 DIAGNOSIS — C719 Malignant neoplasm of brain, unspecified: Secondary | ICD-10-CM

## 2021-04-09 DIAGNOSIS — G40909 Epilepsy, unspecified, not intractable, without status epilepticus: Secondary | ICD-10-CM

## 2021-04-09 DIAGNOSIS — Z79899 Other long term (current) drug therapy: Secondary | ICD-10-CM | POA: Diagnosis not present

## 2021-04-09 DIAGNOSIS — Z7901 Long term (current) use of anticoagulants: Secondary | ICD-10-CM | POA: Diagnosis not present

## 2021-04-09 DIAGNOSIS — Z5112 Encounter for antineoplastic immunotherapy: Secondary | ICD-10-CM | POA: Diagnosis not present

## 2021-04-09 LAB — CBC WITH DIFFERENTIAL (CANCER CENTER ONLY)
Abs Immature Granulocytes: 0.03 10*3/uL (ref 0.00–0.07)
Basophils Absolute: 0 10*3/uL (ref 0.0–0.1)
Basophils Relative: 0 %
Eosinophils Absolute: 0 10*3/uL (ref 0.0–0.5)
Eosinophils Relative: 1 %
HCT: 40.9 % (ref 36.0–46.0)
Hemoglobin: 14.2 g/dL (ref 12.0–15.0)
Immature Granulocytes: 1 %
Lymphocytes Relative: 9 %
Lymphs Abs: 0.5 10*3/uL — ABNORMAL LOW (ref 0.7–4.0)
MCH: 32.9 pg (ref 26.0–34.0)
MCHC: 34.7 g/dL (ref 30.0–36.0)
MCV: 94.7 fL (ref 80.0–100.0)
Monocytes Absolute: 0.4 10*3/uL (ref 0.1–1.0)
Monocytes Relative: 7 %
Neutro Abs: 4.8 10*3/uL (ref 1.7–7.7)
Neutrophils Relative %: 82 %
Platelet Count: 205 10*3/uL (ref 150–400)
RBC: 4.32 MIL/uL (ref 3.87–5.11)
RDW: 12.2 % (ref 11.5–15.5)
WBC Count: 5.8 10*3/uL (ref 4.0–10.5)
nRBC: 0 % (ref 0.0–0.2)

## 2021-04-09 LAB — CMP (CANCER CENTER ONLY)
ALT: 20 U/L (ref 0–44)
AST: 15 U/L (ref 15–41)
Albumin: 3.5 g/dL (ref 3.5–5.0)
Alkaline Phosphatase: 39 U/L (ref 38–126)
Anion gap: 13 (ref 5–15)
BUN: 12 mg/dL (ref 6–20)
CO2: 22 mmol/L (ref 22–32)
Calcium: 9.1 mg/dL (ref 8.9–10.3)
Chloride: 107 mmol/L (ref 98–111)
Creatinine: 0.68 mg/dL (ref 0.44–1.00)
GFR, Estimated: >60 mL/min (ref >60)
Glucose, Bld: 96 mg/dL (ref 70–99)
Potassium: 4.1 mmol/L (ref 3.5–5.1)
Sodium: 142 mmol/L (ref 135–145)
Total Bilirubin: 0.4 mg/dL (ref 0.3–1.2)
Total Protein: 6.5 g/dL (ref 6.5–8.1)

## 2021-04-09 LAB — TOTAL PROTEIN, URINE DIPSTICK: Protein, ur: NEGATIVE mg/dL

## 2021-04-09 MED ORDER — SODIUM CHLORIDE 0.9 % IV SOLN
Freq: Once | INTRAVENOUS | Status: AC
  Filled 2021-04-09: qty 250

## 2021-04-09 MED ORDER — SODIUM CHLORIDE 0.9 % IV SOLN
10.0000 mg/kg | Freq: Once | INTRAVENOUS | Status: AC
  Administered 2021-04-09: 700 mg via INTRAVENOUS
  Filled 2021-04-09: qty 16

## 2021-04-09 NOTE — Patient Instructions (Signed)
[facility]  Discharge Instructions: °Thank you for choosing [facility] to provide your oncology and hematology care.  ° °If you have a lab appointment with the Cancer Center, please go directly to the Cancer Center and check in at the registration area. °  °Wear comfortable clothing and clothing appropriate for easy access to any Portacath or PICC line.  ° °We strive to give you quality time with your provider. You may need to reschedule your appointment if you arrive late (15 or more minutes).  Arriving late affects you and other patients whose appointments are after yours.  Also, if you miss three or more appointments without notifying the office, you may be dismissed from the clinic at the provider’s discretion.    °  °For prescription refill requests, have your pharmacy contact our office and allow 72 hours for refills to be completed.   ° °Today you received the following chemotherapy and/or immunotherapy agents: Bevacizumab.     °  °To help prevent nausea and vomiting after your treatment, we encourage you to take your nausea medication as directed. ° °BELOW ARE SYMPTOMS THAT SHOULD BE REPORTED IMMEDIATELY: °*FEVER GREATER THAN 100.4 F (38 °C) OR HIGHER °*CHILLS OR SWEATING °*NAUSEA AND VOMITING THAT IS NOT CONTROLLED WITH YOUR NAUSEA MEDICATION °*UNUSUAL SHORTNESS OF BREATH °*UNUSUAL BRUISING OR BLEEDING °*URINARY PROBLEMS (pain or burning when urinating, or frequent urination) °*BOWEL PROBLEMS (unusual diarrhea, constipation, pain near the anus) °TENDERNESS IN MOUTH AND THROAT WITH OR WITHOUT PRESENCE OF ULCERS (sore throat, sores in mouth, or a toothache) °UNUSUAL RASH, SWELLING OR PAIN  °UNUSUAL VAGINAL DISCHARGE OR ITCHING  ° °Items with * indicate a potential emergency and should be followed up as soon as possible or go to the Emergency Department if any problems should occur. ° °Please show the CHEMOTHERAPY ALERT CARD or IMMUNOTHERAPY ALERT CARD at check-in  to the Emergency Department and triage nurse. ° °Should you have questions after your visit or need to cancel or reschedule your appointment, please contact [facility]  Dept: [phone]  and follow the prompts.  Office hours are 8:00 a.m. to 4:30 p.m. Monday - Friday. Please note that voicemails left after 4:00 p.m. may not be returned until the following business day.  We are closed weekends and major holidays. You have access to a nurse at all times for urgent questions. Please call the main number to the clinic Dept: [phone] and follow the prompts. ° ° °For any non-urgent questions, you may also contact your provider using MyChart. We now offer e-Visits for anyone 18 and older to request care online for non-urgent symptoms. For details visit [email or web address]. °  °Also download the MyChart app! Go to the app store, search "MyChart", open the app, select [facility], and log in with your MyChart username and password. ° °Due to Covid, a mask is required upon entering the hospital/clinic. If you do not have a mask, one will be given to you upon arrival. For doctor visits, patients may have 1 support person aged 18 or older with them. For treatment visits, patients cannot have anyone with them due to current Covid guidelines and our immunocompromised population.  ° °

## 2021-04-09 NOTE — Progress Notes (Signed)
[facility] [place], [place] [address] [phone]   Interval Evaluation  Date of Service: 04/09/21 Patient Name: Hannah [name] Patient MRN: [id number] Patient DOB: [birth year] Provider: [name], MD  Identifying Statement:  Hannah [name] is a 60 y.o. female with left frontal glioblastoma   Oncologic History: Oncology History  GBM (glioblastoma multiforme) ([place])   Initial Diagnosis   GBM (glioblastoma multiforme) ([place])   02/17/2020 Surgery   Craniotomy, left frontal resection by Dr. Marcello Moores.  Path is glioblastoma.   08/28/2020 - 10/09/2020 Radiation Therapy   Radiation and concurrent Temozolomide 480m/m2 daily   11/10/2020 -  Chemotherapy   Initiates cycle #1 5-day Temozolomide      [facility]   Admitted for saddle PE, undergoes thrombectomy and IVC filter placement   03/27/2021 -  Chemotherapy    Patient is on Treatment Plan: BRAIN GLIOBLASTOMA RADIATION THERAPY WITH CONCURRENT TEMOZOLOMIDE 75 MG/M2 DAILY FOLLOWED BY SEQUENTIAL MAINTENANCE TEMOZOLOMIDE X 6-12 CYCLES   Patient is on Antibody Plan: BRAIN GBM BEVACIZUMAB 14D X 6 CYCLES      Biomarkers:  MGMT Methylated.  IDH 1/2 Wild type.  EGFR Unknown  TERT Unknown   Interval History:  Hannah [name] presents today for avastin infusion.  She and her daughter describe moderate improvement in speech, energy and activity since the avastin infusion two weeks ago.  No recurrence of seizure events since increasing Vimpat to 1042m200mg.  Still complains of weakness in legs, difficulty getting up stairs and up from seated position.  Continues to use the safety belt for walking and/or walker or wheelchair at times.  Decadron down to 80m77m.5mg  Decadron 11/09/20: 2mg880m/10/22: 8mg 280m21/22: 6mg 069m7/22: 4mg 0322m/22: 8mg 04/37m22: 6mg 05/135m2: 3mg 05/2345m: 2mg 06/06/74m 1.5mg  H+P (384m/21) Patient presented one month ago with  sudden onset left arm and leg weakness and interrupted speech, [name], [name], stroke eval and CNS imaging which demonstrated a non-enhancing left frontal mass.  At this time she is back to baseline without recurrence of events, taking Keppra 500mg twice p380may.  No history or seizure or any other neurologic events.  She does describe being sleep deprived prior to seizure event.  She presents today after one month follow up MRI scan.  Medications: Current Outpatient Medications on File Prior to Visit  Medication Sig Dispense Refill  . apixaban (ELIQUIS) 5 MG TABS tablet TAKE 1 TABLET (5 MG TOTAL) BY MOUTH 2 (TWO) TIMES DAILY. 60 tablet 3  . cholecalciferol (VITAMIN D3) 25 MCG (1000 UNIT) tablet Take 1,000 Units by mouth daily.    . dexamethasoMarland Kitchene (DECADRON) 1 MG tablet Take 1 tablet (1 mg total) by mouth daily. 60 tablet 1  . docusate sodium (COLACE) 100 MG capsule Take 100 mg by mouth 2 (two) times daily as needed for mild constipation.    . Lacosamide 100 MG TABS Take 1 tablet by mouth in the AM (100mg) and 2 t20mts by mouth at bedtime (200mg) 90 table780m . levETIRAcetam (KEPPRA) 500 MG tablet Take 2 tablets (1,000 mg total) by mouth 2 (two) times daily. (Patient not taking: Reported on 04/09/2021) 120 tablet 2  . ondansetron (ZOFRAN) 8 MG tablet TAKE 1 TABLET BY MOUTH TWICE DAILY AS NEEDED FOR NAUSEA AND/OR VOMITING. MAY TAKE 30 TO  [place] IF NEEDED (Patient not taking: No sig reported) 30 tablet 0  . temozolomide (TEMODAR) 100 MG capsule TAKE 1 CAPSULE (100 MG TOTAL) BY MOUTH DAILY. MAY TAKE ON AN EMPTY STOMACH TO DECREASE NAUSEA & VOMITING. (Patient not taking: No sig reported) 42 capsule 0  . temozolomide (TEMODAR) 20 MG capsule TAKE 1 CAPSULE (20 MG TOTAL) BY MOUTH DAILY. MAY TAKE ON AN EMPTY STOMACH TO DECREASE NAUSEA & VOMITING. (Patient  not taking: No sig reported) 42 capsule 0  . [DISCONTINUED] lamoTRIgine (LAMICTAL) 100 MG tablet Take 1 tablet (100 mg total) by mouth daily. 30 tablet 3   No current facility-administered medications on file prior to visit.    Allergies: No Known Allergies Past Medical History:  Past Medical History:  Diagnosis Date  . Cervical spondylolysis 12/15/2019   Skeleton: Mild cervical spondylosis C6-7. Incidental find.   . Colon polyp   . GBM (glioblastoma multiforme) ([place])   . Left ankle sprain 10/07/2011  . Seizure ([place])    partial   . Tick bite 02/2020   Past Surgical History:  Past Surgical History:  Procedure Laterality Date  . APPLICATION OF CRANIAL NAVIGATION Left 02/16/2020   Procedure: APPLICATION OF CRANIAL NAVIGATION;  Surgeon: Vallarie Mare, MD;  Location: [facility];  Service: Neurosurgery;  Laterality: Left;  . CRANIOTOMY Left 02/16/2020   Procedure: LEFT FRONTAL CRANIOTOMY FOR BRAIN TUMOR;  Surgeon: Vallarie Mare, MD;  Location: [facility];  Service: Neurosurgery;  Laterality: Left;  . FOOT SURGERY Left    bone spur - local anesthesia  . IR ANGIOGRAM PULMONARY BILATERAL SELECTIVE  12/15/2020  . IR ANGIOGRAM SELECTIVE EACH ADDITIONAL VESSEL  12/15/2020  . IR ANGIOGRAM SELECTIVE EACH ADDITIONAL VESSEL  12/15/2020  . IR IVC FILTER PLMT / S&I /IMG GUID/MOD SED  12/15/2020  . IR THROMBECT PRIM MECH INIT (IN[facility]) MOD SED  12/15/2020  . IR THROMBECT PRIM MECH INIT (INCLU) MOD SED  12/15/2020  . RADIOLOGY WITH ANESTHESIA N/A 12/15/2020   Procedure: IR WITH ANESTHESIA;  Surgeon: Radiologist, Medication, MD;  Location: [facility];  Service: Radiology;  Laterality: N/A;  . TONSILECTOMY/ADENOIDECTOMY WITH MYRINGOTOMY     tonsils and adenoids out only  . WISDOM TOOTH EXTRACTION     Social History:  Social History   Socioeconomic History  . Marital status: Married    Spouse name: Not on file  . Number of children: 3  . Years of education: Not on file  . Highest education level: Not on file   Occupational History  . Not on file  Tobacco Use  . Smoking status: Former Smoker    Years: 2.00    Types: Cigarettes    Quit date: 11/05/1979    Years since quitting: 41.4  . Smokeless tobacco: Never Used  . Tobacco comment: 1/2 pack a week  Vaping Use  . Vaping Use: Never used  Substance and Sexual Activity  . Alcohol use: Yes    Alcohol/week: 0.0 standard drinks    Comment: 1 glass of wine weekly  . Drug use: No  . Sexual activity: Yes    Birth control/protection: Surgical  Other Topics Concern  . Not on file  Social History Narrative  . Not on file   Social Determinants of Health   Financial Resource Strain: Not on file  Food Insecurity: Not on file  Transportation Needs: Not on file  Physical Activity: Not on file  Stress: Not on file  Social Connections: Not on file  Intimate Partner Violence: Not on file   Family History:  Family History  Problem Relation Age of Onset  . Hypertension Mother   . Stroke Mother   . Diabetes Mother   . Colon cancer Father 10  . Hypertension Sister   . Colon polyps Sister   . Heart disease Brother 57  . Diabetes Maternal Uncle   . Diabetes Maternal Uncle     Review of Systems: Constitutional: Doesn't report fevers, chills or abnormal weight loss Eyes: Doesn't report blurriness of vision Ears, nose, mouth, throat, and face: Doesn't report sore throat Respiratory: Chest wall pain Cardiovascular: Doesn't report palpitation, chest discomfort  Gastrointestinal:  Doesn't report nausea, constipation, diarrhea GU: Doesn't report incontinence Skin: Doesn't report skin rashes Neurological: Per HPI Musculoskeletal: Doesn't report joint pain Behavioral/Psych: Doesn't report anxiety  Physical Exam: Vitals:   04/09/21 1245  BP: 112/89  Pulse: 68  Resp: 18  Temp: 97.8 F (36.6 C)  SpO2: 99%   KPS: 60. General: Alert, cooperative, pleasant, in no acute distress Head: Normal EENT: No conjunctival injection or scleral  icterus.  Lungs: Resp effort normal Cardiac: Regular rate Abdomen: Non-distended abdomen Skin: No rashes cyanosis or petechiae. Extremities: No clubbing or edema  Neurologic Exam: Mental Status: Awake, alert, attentive to examiner. Oriented to self and environment. Language is fluent with intact comprehension.  Occasional interruptions in fluency.  Cranial Nerves: Visual acuity is grossly normal. Visual fields are full. Extra-ocular movements intact. No ptosis. Face is symmetric Motor: Tone and bulk are normal. Right leg 4/5, left leg 4+/5, proximal greater than distal weakness. Reflexes are symmetric, no pathologic reflexes present.  Sensory: Intact to light touch Gait: Deferred today  Labs: I have reviewed the data as listed    Component Value Date/Time   NA 142 04/09/2021 1209   K 4.1 04/09/2021 1209   CL 107 04/09/2021 1209   CO2 22 04/09/2021 1209   GLUCOSE 96 04/09/2021 1209   GLUCOSE 85 10/13/2006 1158   BUN 12 04/09/2021 1209   CREATININE 0.68 04/09/2021 1209   CALCIUM 9.1 04/09/2021 1209   PROT 6.5 04/09/2021 1209   ALBUMIN 3.5 04/09/2021 1209   AST 15 04/09/2021 1209   ALT 20 04/09/2021 1209   ALKPHOS 39 04/09/2021 1209   BILITOT 0.4 04/09/2021 1209   GFRNONAA >60 04/09/2021 1209   GFRAA >60 02/17/2020 0136   Lab Results  Component Value Date   WBC 5.8 04/09/2021   NEUTROABS 4.8 04/09/2021   HGB 14.2 04/09/2021   HCT 40.9 04/09/2021   MCV 94.7 04/09/2021   PLT 205 04/09/2021    Assessment/Plan GBM (glioblastoma multiforme) (H[facility])  Seizure disorder (H[facility])  Angelis A Teters is clinically stable today, cleared to dose 2nd cycle of Avastin.  Labs are within normal limits.  Vimpat should continue at 186m/200mg  We recommended continuing treatment with Avastin 172mkg IV q2 weeks.  Avastin will help treat inflammatory processes in the brain, and act as steroid sparing agent; of greater need given myopathic features noted today.  It may also help control  progression of organic disease, if present.  We counseled her on side effects of Avastin, including hypertension, bleeding/clotting events, and wound healing impairment.  She is agreeable with this plan.  Avastin should be held for the following:  ANC less than 500  Platelets less than 50,000  LFT or creatinine greater than 2x ULN  If clinical concerns/contraindications develop   Decadron should decrease to 70m36maily, then 0.5mg88mily in one week.  Will continue to defer chemotherapy in the very near term due to patient choice.    We ask that Alyson A Laiche return to clinic in 2 weeks for next infusion.  All questions were answered. The patient knows to call the clinic with any problems, questions or concerns. No barriers to learning were detected.  I have spent a total of 30 minutes of face-to-face and non-face-to-face time, excluding clinical staff time, preparing to see patient, ordering tests and/or medications, counseling the patient, and independently interpreting results and communicating results to the patient/family/caregiver    [name], MD Medical Director of Neuro-Oncology [facility] at [place] 04/09/21 12:49 PM

## 2021-04-11 ENCOUNTER — Telehealth: Payer: Self-pay | Admitting: Internal Medicine

## 2021-04-11 NOTE — Telephone Encounter (Signed)
Scheduled per 06/06 los, patient has been called and voicemail was left regarding upcoming appointments.

## 2021-04-12 ENCOUNTER — Other Ambulatory Visit (HOSPITAL_BASED_OUTPATIENT_CLINIC_OR_DEPARTMENT_OTHER): Payer: Self-pay

## 2021-04-16 ENCOUNTER — Other Ambulatory Visit (HOSPITAL_BASED_OUTPATIENT_CLINIC_OR_DEPARTMENT_OTHER): Payer: Self-pay

## 2021-04-23 ENCOUNTER — Ambulatory Visit: Payer: 59

## 2021-04-23 ENCOUNTER — Encounter: Payer: Self-pay | Admitting: Internal Medicine

## 2021-04-23 ENCOUNTER — Other Ambulatory Visit (HOSPITAL_BASED_OUTPATIENT_CLINIC_OR_DEPARTMENT_OTHER): Payer: Self-pay

## 2021-04-23 ENCOUNTER — Other Ambulatory Visit: Payer: Self-pay

## 2021-04-23 ENCOUNTER — Inpatient Hospital Stay (HOSPITAL_BASED_OUTPATIENT_CLINIC_OR_DEPARTMENT_OTHER): Payer: 59 | Admitting: Internal Medicine

## 2021-04-23 ENCOUNTER — Inpatient Hospital Stay: Payer: 59

## 2021-04-23 ENCOUNTER — Ambulatory Visit: Payer: 59 | Admitting: Internal Medicine

## 2021-04-23 ENCOUNTER — Other Ambulatory Visit: Payer: 59

## 2021-04-23 VITALS — BP 127/90 | HR 72 | Temp 97.0°F | Resp 18 | Wt 143.1 lb

## 2021-04-23 DIAGNOSIS — C719 Malignant neoplasm of brain, unspecified: Secondary | ICD-10-CM

## 2021-04-23 DIAGNOSIS — Z79899 Other long term (current) drug therapy: Secondary | ICD-10-CM | POA: Diagnosis not present

## 2021-04-23 DIAGNOSIS — C711 Malignant neoplasm of frontal lobe: Secondary | ICD-10-CM | POA: Diagnosis not present

## 2021-04-23 DIAGNOSIS — Z7901 Long term (current) use of anticoagulants: Secondary | ICD-10-CM | POA: Diagnosis not present

## 2021-04-23 DIAGNOSIS — Z5112 Encounter for antineoplastic immunotherapy: Secondary | ICD-10-CM | POA: Diagnosis not present

## 2021-04-23 DIAGNOSIS — G40909 Epilepsy, unspecified, not intractable, without status epilepticus: Secondary | ICD-10-CM | POA: Diagnosis not present

## 2021-04-23 LAB — CBC WITH DIFFERENTIAL (CANCER CENTER ONLY)
Abs Immature Granulocytes: 0.03 10*3/uL (ref 0.00–0.07)
Basophils Absolute: 0 10*3/uL (ref 0.0–0.1)
Basophils Relative: 0 %
Eosinophils Absolute: 0.1 10*3/uL (ref 0.0–0.5)
Eosinophils Relative: 2 %
HCT: 39.6 % (ref 36.0–46.0)
Hemoglobin: 13.4 g/dL (ref 12.0–15.0)
Immature Granulocytes: 1 %
Lymphocytes Relative: 13 %
Lymphs Abs: 0.7 10*3/uL (ref 0.7–4.0)
MCH: 32.8 pg (ref 26.0–34.0)
MCHC: 33.8 g/dL (ref 30.0–36.0)
MCV: 96.8 fL (ref 80.0–100.0)
Monocytes Absolute: 0.6 10*3/uL (ref 0.1–1.0)
Monocytes Relative: 10 %
Neutro Abs: 4.2 10*3/uL (ref 1.7–7.7)
Neutrophils Relative %: 74 %
Platelet Count: 202 10*3/uL (ref 150–400)
RBC: 4.09 MIL/uL (ref 3.87–5.11)
RDW: 12.5 % (ref 11.5–15.5)
WBC Count: 5.5 10*3/uL (ref 4.0–10.5)
nRBC: 0 % (ref 0.0–0.2)

## 2021-04-23 LAB — CMP (CANCER CENTER ONLY)
ALT: 13 U/L (ref 0–44)
AST: 17 U/L (ref 15–41)
Albumin: 3.7 g/dL (ref 3.5–5.0)
Alkaline Phosphatase: 40 U/L (ref 38–126)
Anion gap: 8 (ref 5–15)
BUN: 12 mg/dL (ref 6–20)
CO2: 27 mmol/L (ref 22–32)
Calcium: 8.8 mg/dL — ABNORMAL LOW (ref 8.9–10.3)
Chloride: 104 mmol/L (ref 98–111)
Creatinine: 0.69 mg/dL (ref 0.44–1.00)
GFR, Estimated: >60 mL/min (ref >60)
Glucose, Bld: 76 mg/dL (ref 70–99)
Potassium: 3.4 mmol/L — ABNORMAL LOW (ref 3.5–5.1)
Sodium: 139 mmol/L (ref 135–145)
Total Bilirubin: 0.6 mg/dL (ref 0.3–1.2)
Total Protein: 6.4 g/dL — ABNORMAL LOW (ref 6.5–8.1)

## 2021-04-23 LAB — TOTAL PROTEIN, URINE DIPSTICK: Protein, ur: NEGATIVE mg/dL

## 2021-04-23 MED ORDER — LACOSAMIDE 100 MG PO TABS
ORAL_TABLET | ORAL | 3 refills | Status: DC
  Filled 2021-04-23: qty 90, 30d supply, fill #0
  Filled 2021-05-24: qty 90, 30d supply, fill #1
  Filled 2021-06-25: qty 90, 30d supply, fill #2
  Filled 2021-07-24: qty 90, 30d supply, fill #3

## 2021-04-23 MED ORDER — BEVACIZUMAB-AWWB CHEMO INJECTION 400 MG/16ML
10.0000 mg/kg | Freq: Once | INTRAVENOUS | Status: AC
  Administered 2021-04-23: 700 mg via INTRAVENOUS
  Filled 2021-04-23: qty 16

## 2021-04-23 MED ORDER — SODIUM CHLORIDE 0.9 % IV SOLN
Freq: Once | INTRAVENOUS | Status: AC
  Filled 2021-04-23: qty 250

## 2021-04-23 NOTE — Patient Instructions (Signed)
[facility]   °Discharge Instructions: °Thank you for choosing [facility] to provide your oncology and hematology care.  ° °If you have a lab appointment with the Cancer Center, please go directly to the Cancer Center and check in at the registration area. °  °Wear comfortable clothing and clothing appropriate for easy access to any Portacath or PICC line.  ° °We strive to give you quality time with your provider. You may need to reschedule your appointment if you arrive late (15 or more minutes).  Arriving late affects you and other patients whose appointments are after yours.  Also, if you miss three or more appointments without notifying the office, you may be dismissed from the clinic at the provider’s discretion.    °  °For prescription refill requests, have your pharmacy contact our office and allow 72 hours for refills to be completed.   ° °Today you received the following chemotherapy and/or immunotherapy agents: avastin  °  °To help prevent nausea and vomiting after your treatment, we encourage you to take your nausea medication as directed. ° °BELOW ARE SYMPTOMS THAT SHOULD BE REPORTED IMMEDIATELY: °*FEVER GREATER THAN 100.4 F (38 °C) OR HIGHER °*CHILLS OR SWEATING °*NAUSEA AND VOMITING THAT IS NOT CONTROLLED WITH YOUR NAUSEA MEDICATION °*UNUSUAL SHORTNESS OF BREATH °*UNUSUAL BRUISING OR BLEEDING °*URINARY PROBLEMS (pain or burning when urinating, or frequent urination) °*BOWEL PROBLEMS (unusual diarrhea, constipation, pain near the anus) °TENDERNESS IN MOUTH AND THROAT WITH OR WITHOUT PRESENCE OF ULCERS (sore throat, sores in mouth, or a toothache) °UNUSUAL RASH, SWELLING OR PAIN  °UNUSUAL VAGINAL DISCHARGE OR ITCHING  ° °Items with * indicate a potential emergency and should be followed up as soon as possible or go to the Emergency Department if any problems should occur. ° °Please show the CHEMOTHERAPY ALERT CARD or IMMUNOTHERAPY ALERT CARD at check-in to  the Emergency Department and triage nurse. ° °Should you have questions after your visit or need to cancel or reschedule your appointment, please contact [facility]  Dept: [phone]  and follow the prompts.  Office hours are 8:00 a.m. to 4:30 p.m. Monday - Friday. Please note that voicemails left after 4:00 p.m. may not be returned until the following business day.  We are closed weekends and major holidays. You have access to a nurse at all times for urgent questions. Please call the main number to the clinic Dept: [phone] and follow the prompts. ° ° °For any non-urgent questions, you may also contact your provider using MyChart. We now offer e-Visits for anyone 18 and older to request care online for non-urgent symptoms. For details visit [email or web address]. °  °Also download the MyChart app! Go to the app store, search "MyChart", open the app, select [facility], and log in with your MyChart username and password. ° °Due to Covid, a mask is required upon entering the hospital/clinic. If you do not have a mask, one will be given to you upon arrival. For doctor visits, patients may have 1 support person aged 18 or older with them. For treatment visits, patients cannot have anyone with them due to current Covid guidelines and our immunocompromised population.  ° °

## 2021-04-23 NOTE — Progress Notes (Signed)
[facility] at [place], [place] [address] [phone]   Interval Evaluation  Date of Service: 04/23/21 Patient Name: Hannah [name] Patient MRN: [id number] Patient DOB: 16-Jul-1961 Provider: [name], MD  Identifying Statement:  Hannah [name] is a 60 y.o. female with left frontal glioblastoma   Oncologic History: Oncology History  GBM (glioblastoma multiforme) ([place])   Initial Diagnosis   GBM (glioblastoma multiforme) ([place])   02/17/2020 Surgery   Craniotomy, left frontal resection by Dr. Marcello Moores.  Path is glioblastoma.   08/28/2020 - 10/09/2020 Radiation Therapy   Radiation and concurrent Temozolomide 30m/m2 daily   11/10/2020 -  Chemotherapy   Initiates cycle #1 5-day Temozolomide      [facility]   Admitted for saddle PE, undergoes thrombectomy and IVC filter placement   03/27/2021 -  Chemotherapy    Patient is on Treatment Plan: BRAIN GLIOBLASTOMA RADIATION THERAPY WITH CONCURRENT TEMOZOLOMIDE 75 MG/M2 DAILY FOLLOWED BY SEQUENTIAL MAINTENANCE TEMOZOLOMIDE X 6-12 CYCLES   Patient is on Antibody Plan: BRAIN GBM BEVACIZUMAB 14D X 6 CYCLES       Biomarkers:  MGMT Methylated.  IDH 1/2 Wild type.  EGFR Unknown  TERT Unknown   Interval History:  [name] presents today for third avastin infusion.  No new or progressive neurologic deficits.  No recurrence of seizure events since increasing Vimpat to 107m200mg.  Did have a fall over the weekend, provoked, no injury.  Decadron discontinued as of today per daughter.  Decadron 11/09/20: 3m34m2/10/22: 8mg25m/21/22: 6mg 25m07/22: 4mg 066m9/22: 8mg 0455m/22: 6mg 05/63m22: 3mg 05/254m2: 3mg 06/0621m: 1.5mg  H+P (38m8/21) Patient presented one month ago with sudden onset left arm and leg weakness and interrupted speech, [name], noticed poor grip on racket and could not express herself  verbally.  This led to ED visit, stroke eval and CNS imaging which demonstrated a non-enhancing left frontal mass.  At this time she is back to baseline without recurrence of events, taking Keppra 500mg twice 65mday.  No history or seizure or any other neurologic events.  She does describe being sleep deprived prior to seizure event.  She presents today after one month follow up MRI scan.  Medications: Current Outpatient Medications on File Prior to Visit  Medication Sig Dispense Refill   apixaban (ELIQUIS) 5 MG TABS tablet TAKE 1 TABLET (5 MG TOTAL) BY MOUTH 2 (TWO) TIMES DAILY. 60 tablet 3   cholecalciferol (VITAMIN D3) 25 MCG (1000 UNIT) tablet Take 1,000 Units by mouth daily.     docusate sodium (COLACE) 100 MG capsule Take 100 mg by mouth 2 (two) times daily as needed for mild constipation.     Lacosamide 100 MG TABS Take 1 tablet by mouth in the AM (100mg) and 2 50mets by mouth at bedtime (200mg) 90 tabl57m   levETIRAcetam (KEPPRA) 500 MG tablet Take 2 tablets (1,000 mg total) by mouth 2 (two) times daily. 120 tablet 2   ondansetron (ZOFRAN) 8 MG tablet TAKE 1 TABLET BY MOUTH TWICE DAILY AS NEEDED FOR NAUSEA AND/OR VOMITING. MAY TAKE 30 TO 60 MINUTES PRIOR TO TEMODAR ADMINISTRATION IF NEEDED 30 tablet 0   temozolomide (TEMODAR) 100 MG capsule TAKE 1 CAPSULE (100 MG TOTAL) BY MOUTH DAILY. MAY TAKE ON AN EMPTY STOMACH TO DECREASE NAUSEA & VOMITING. 42 capsule 0   temozolomide (TEMODAR) 20 MG capsule TAKE 1 CAPSULE (20 MG  TOTAL) BY MOUTH DAILY. MAY TAKE ON AN EMPTY STOMACH TO DECREASE NAUSEA & VOMITING. 42 capsule 0   [DISCONTINUED] lamoTRIgine (LAMICTAL) 100 MG tablet Take 1 tablet (100 mg total) by mouth daily. 30 tablet 3   No current facility-administered medications on file prior to visit.    Allergies: No Known Allergies Past Medical History:  Past Medical History:  Diagnosis Date   Cervical spondylolysis 12/15/2019   Skeleton: Mild cervical spondylosis C6-7. Incidental find.     Colon polyp    GBM (glioblastoma multiforme) (HCC)    Left ankle sprain 10/07/2011   Seizure ([place])    partial    Tick bite 02/2020   Past Surgical History:  Past Surgical History:  Procedure Laterality Date   APPLICATION OF CRANIAL NAVIGATION Left 02/16/2020   Procedure: APPLICATION OF CRANIAL NAVIGATION;  Surgeon: Vallarie Mare, MD;  Location: [facility];  Service: Neurosurgery;  Laterality: Left;   CRANIOTOMY Left 02/16/2020   Procedure: LEFT FRONTAL CRANIOTOMY FOR BRAIN TUMOR;  Surgeon: Vallarie Mare, MD;  Location: [facility];  Service: Neurosurgery;  Laterality: Left;   FOOT SURGERY Left    bone spur - local anesthesia   IR ANGIOGRAM PULMONARY BILATERAL SELECTIVE  12/15/2020   IR ANGIOGRAM SELECTIVE EACH ADDITIONAL VESSEL  12/15/2020   IR ANGIOGRAM SELECTIVE EACH ADDITIONAL VESSEL  12/15/2020   IR IVC FILTER PLMT / S&I /IMG GUID/MOD SED  12/15/2020   IR THROMBECT PRIM MECH INIT (INCLU) MOD SED  12/15/2020   IR THROMBECT PRIM MECH INIT (INCLU) MOD SED  12/15/2020   RADIOLOGY WITH ANESTHESIA N/A 12/15/2020   Procedure: IR WITH ANESTHESIA;  Surgeon: Radiologist, Medication, MD;  Location: [facility];  Service: Radiology;  Laterality: N/A;   TONSILECTOMY/ADENOIDECTOMY WITH MYRINGOTOMY     tonsils and adenoids out only   WISDOM TOOTH EXTRACTION     Social History:  Social History   Socioeconomic History   Marital status: Married    Spouse name: Not on file   Number of children: 3   Years of education: Not on file   Highest education level: Not on file  Occupational History   Not on file  Tobacco Use   Smoking status: Former    Years: 2.00    Pack years: 0.00    Types: Cigarettes    Quit date: 11/05/1979    Years since quitting: 41.4   Smokeless tobacco: Never   Tobacco comments:    1/2 pack a week  Vaping Use   Vaping Use: Never used  Substance and Sexual Activity   Alcohol use: Yes    Alcohol/week: 0.0 standard drinks    Comment: 1 glass of wine weekly   Drug use: No   Sexual  activity: Yes    Birth control/protection: Surgical  Other Topics Concern   Not on file  Social History Narrative   Not on file   Social Determinants of Health   Financial Resource Strain: Not on file  Food Insecurity: Not on file  Transportation Needs: Not on file  Physical Activity: Not on file  Stress: Not on file  Social Connections: Not on file  Intimate Partner Violence: Not on file   Family History:  Family History  Problem Relation Age of Onset   Hypertension Mother    Stroke Mother    Diabetes Mother    Colon cancer Father 53   Hypertension Sister    Colon polyps Sister    Heart disease Brother 46   Diabetes Maternal Uncle  Diabetes Maternal Uncle     Review of Systems: Constitutional: Doesn't report fevers, chills or abnormal weight loss Eyes: Doesn't report blurriness of vision Ears, nose, mouth, throat, and face: Doesn't report sore throat Respiratory: Chest wall pain Cardiovascular: Doesn't report palpitation, chest discomfort  Gastrointestinal:  Doesn't report nausea, constipation, diarrhea GU: Doesn't report incontinence Skin: Doesn't report skin rashes Neurological: Per HPI Musculoskeletal: Doesn't report joint pain Behavioral/Psych: Doesn't report anxiety  Physical Exam: Vitals:   04/23/21 1240  BP: 127/90  Pulse: 72  Resp: 18  Temp: (!) 97 F (36.1 C)  SpO2: 92%   KPS: 60. General: Alert, cooperative, pleasant, in no acute distress Head: Normal EENT: No conjunctival injection or scleral icterus.  Lungs: Resp effort normal Cardiac: Regular rate Abdomen: Non-distended abdomen Skin: No rashes cyanosis or petechiae. Extremities: No clubbing or edema  Neurologic Exam: Mental Status: Awake, alert, attentive to examiner. Oriented to self and environment. Language is fluent with intact comprehension.  Occasional interruptions in fluency.  Cranial Nerves: Visual acuity is grossly normal. Visual fields are full. Extra-ocular movements  intact. No ptosis. Face is symmetric Motor: Tone and bulk are normal. Right leg 4/5, left leg 4+/5, proximal greater than distal weakness. Reflexes are symmetric, no pathologic reflexes present.  Sensory: Intact to light touch Gait: Deferred today  Labs: I have reviewed the data as listed    Component Value Date/Time   NA 142 04/09/2021 1209   K 4.1 04/09/2021 1209   CL 107 04/09/2021 1209   CO2 22 04/09/2021 1209   GLUCOSE 96 04/09/2021 1209   GLUCOSE 85 10/13/2006 1158   BUN 12 04/09/2021 1209   CREATININE 0.68 04/09/2021 1209   CALCIUM 9.1 04/09/2021 1209   PROT 6.5 04/09/2021 1209   ALBUMIN 3.5 04/09/2021 1209   AST 15 04/09/2021 1209   ALT 20 04/09/2021 1209   ALKPHOS 39 04/09/2021 1209   BILITOT 0.4 04/09/2021 1209   GFRNONAA >60 04/09/2021 1209   GFRAA >60 02/17/2020 0136   Lab Results  Component Value Date   WBC 5.5 04/23/2021   NEUTROABS 4.2 04/23/2021   HGB 13.4 04/23/2021   HCT 39.6 04/23/2021   MCV 96.8 04/23/2021   PLT 202 04/23/2021    Assessment/Plan GBM (glioblastoma multiforme) (HCC)  Seizure disorder (HCC)  Quanna A Carbin is clinically stable today, cleared to dose 3rd cycle of Avastin.  Labs are within normal limits.  Vimpat should continue at 124m/200mg  We recommended continuing treatment with Avastin 150mkg IV q2 weeks.  Avastin will help treat inflammatory processes in the brain, and act as steroid sparing agent; of greater need given myopathic features noted today.  It may also help control progression of organic disease, if present.    Avastin should be held for the following:  ANC less than 500  Platelets less than 50,000  LFT or creatinine greater than 2x ULN  If clinical concerns/contraindications develop   She should continue to withhold decadron if tolerated.  Will continue to defer chemotherapy in the very near term due to patient choice.    We ask that Laresa A Gwinner return to clinic in 2 weeks for next infusion.   Next MRI scheduled for 05/25/21.  All questions were answered. The patient knows to call the clinic with any problems, questions or concerns. No barriers to learning were detected.  I have spent a total of 30 minutes of face-to-face and non-face-to-face time, excluding clinical staff time, preparing to see patient, ordering tests and/or medications, counseling the  patient, and independently interpreting results and communicating results to the patient/family/caregiver    [name], MD Medical Director of Neuro-Oncology [facility] at [place] 04/23/21 12:55 PM

## 2021-04-25 ENCOUNTER — Other Ambulatory Visit (HOSPITAL_BASED_OUTPATIENT_CLINIC_OR_DEPARTMENT_OTHER): Payer: Self-pay

## 2021-04-27 ENCOUNTER — Ambulatory Visit: Payer: 59

## 2021-05-02 DIAGNOSIS — C719 Malignant neoplasm of brain, unspecified: Secondary | ICD-10-CM | POA: Diagnosis not present

## 2021-05-03 ENCOUNTER — Other Ambulatory Visit: Payer: Self-pay | Admitting: Radiation Therapy

## 2021-05-08 ENCOUNTER — Other Ambulatory Visit: Payer: 59

## 2021-05-08 ENCOUNTER — Ambulatory Visit: Payer: 59 | Admitting: Internal Medicine

## 2021-05-09 ENCOUNTER — Ambulatory Visit: Payer: 59

## 2021-05-10 ENCOUNTER — Inpatient Hospital Stay (HOSPITAL_BASED_OUTPATIENT_CLINIC_OR_DEPARTMENT_OTHER): Payer: 59 | Admitting: Internal Medicine

## 2021-05-10 ENCOUNTER — Inpatient Hospital Stay: Payer: 59 | Attending: Internal Medicine

## 2021-05-10 ENCOUNTER — Other Ambulatory Visit: Payer: Self-pay

## 2021-05-10 ENCOUNTER — Inpatient Hospital Stay: Payer: 59

## 2021-05-10 VITALS — BP 124/94 | HR 71 | Temp 98.7°F | Resp 18 | Wt 139.2 lb

## 2021-05-10 DIAGNOSIS — Z7901 Long term (current) use of anticoagulants: Secondary | ICD-10-CM | POA: Insufficient documentation

## 2021-05-10 DIAGNOSIS — Z87891 Personal history of nicotine dependence: Secondary | ICD-10-CM | POA: Diagnosis not present

## 2021-05-10 DIAGNOSIS — C719 Malignant neoplasm of brain, unspecified: Secondary | ICD-10-CM | POA: Diagnosis not present

## 2021-05-10 DIAGNOSIS — Z86711 Personal history of pulmonary embolism: Secondary | ICD-10-CM | POA: Diagnosis not present

## 2021-05-10 DIAGNOSIS — G40909 Epilepsy, unspecified, not intractable, without status epilepticus: Secondary | ICD-10-CM

## 2021-05-10 DIAGNOSIS — Z5112 Encounter for antineoplastic immunotherapy: Secondary | ICD-10-CM | POA: Insufficient documentation

## 2021-05-10 DIAGNOSIS — Z79899 Other long term (current) drug therapy: Secondary | ICD-10-CM | POA: Insufficient documentation

## 2021-05-10 DIAGNOSIS — C711 Malignant neoplasm of frontal lobe: Secondary | ICD-10-CM | POA: Insufficient documentation

## 2021-05-10 LAB — CBC WITH DIFFERENTIAL (CANCER CENTER ONLY)
Abs Immature Granulocytes: 0.01 10*3/uL (ref 0.00–0.07)
Basophils Absolute: 0 10*3/uL (ref 0.0–0.1)
Basophils Relative: 1 %
Eosinophils Absolute: 0.1 10*3/uL (ref 0.0–0.5)
Eosinophils Relative: 3 %
HCT: 41.1 % (ref 36.0–46.0)
Hemoglobin: 14.1 g/dL (ref 12.0–15.0)
Immature Granulocytes: 0 %
Lymphocytes Relative: 22 %
Lymphs Abs: 0.7 10*3/uL (ref 0.7–4.0)
MCH: 33.1 pg (ref 26.0–34.0)
MCHC: 34.3 g/dL (ref 30.0–36.0)
MCV: 96.5 fL (ref 80.0–100.0)
Monocytes Absolute: 0.4 10*3/uL (ref 0.1–1.0)
Monocytes Relative: 14 %
Neutro Abs: 1.8 10*3/uL (ref 1.7–7.7)
Neutrophils Relative %: 60 %
Platelet Count: 218 10*3/uL (ref 150–400)
RBC: 4.26 MIL/uL (ref 3.87–5.11)
RDW: 12.4 % (ref 11.5–15.5)
WBC Count: 3 10*3/uL — ABNORMAL LOW (ref 4.0–10.5)
nRBC: 0 % (ref 0.0–0.2)

## 2021-05-10 LAB — CMP (CANCER CENTER ONLY)
ALT: <6 U/L (ref 0–44)
AST: 14 U/L — ABNORMAL LOW (ref 15–41)
Albumin: 3.6 g/dL (ref 3.5–5.0)
Alkaline Phosphatase: 46 U/L (ref 38–126)
Anion gap: 11 (ref 5–15)
BUN: 8 mg/dL (ref 6–20)
CO2: 27 mmol/L (ref 22–32)
Calcium: 9.5 mg/dL (ref 8.9–10.3)
Chloride: 104 mmol/L (ref 98–111)
Creatinine: 0.75 mg/dL (ref 0.44–1.00)
GFR, Estimated: >60 mL/min (ref >60)
Glucose, Bld: 83 mg/dL (ref 70–99)
Potassium: 3.9 mmol/L (ref 3.5–5.1)
Sodium: 142 mmol/L (ref 135–145)
Total Bilirubin: 0.7 mg/dL (ref 0.3–1.2)
Total Protein: 6.7 g/dL (ref 6.5–8.1)

## 2021-05-10 LAB — TOTAL PROTEIN, URINE DIPSTICK: Protein, ur: NEGATIVE mg/dL

## 2021-05-10 MED ORDER — SODIUM CHLORIDE 0.9 % IV SOLN
Freq: Once | INTRAVENOUS | Status: AC
Start: 2021-05-10 — End: 2021-05-10
  Filled 2021-05-10: qty 250

## 2021-05-10 MED ORDER — SODIUM CHLORIDE 0.9 % IV SOLN
10.0000 mg/kg | Freq: Once | INTRAVENOUS | Status: AC
  Administered 2021-05-10: 700 mg via INTRAVENOUS
  Filled 2021-05-10: qty 16

## 2021-05-10 NOTE — Progress Notes (Signed)
[facility] at [place], [place] [address] [phone]   Interval Evaluation  Date of Service: 05/10/21 Patient Name: Hannah [name] Patient MRN: [id number] Patient DOB: 1961/01/05 Provider: [name], MD  Identifying Statement:  Hannah [name] is a 60 y.o. female with left frontal glioblastoma   Oncologic History: Oncology History  GBM (glioblastoma multiforme) ([place])   Initial Diagnosis   GBM (glioblastoma multiforme) ([place])   02/17/2020 Surgery   Craniotomy, left frontal resection by Dr. Marcello Moores.  Path is glioblastoma.   08/28/2020 - 10/09/2020 Radiation Therapy   Radiation and concurrent Temozolomide 659m/m2 daily   11/10/2020 -  Chemotherapy   Initiates cycle #1 5-day Temozolomide      [facility]   [facility] for saddle [facility], undergoes thrombectomy and IVC filter placement   03/27/2021 -  Chemotherapy    Patient is on Treatment Plan: BRAIN GLIOBLASTOMA RADIATION THERAPY WITH CONCURRENT TEMOZOLOMIDE 75 MG/M2 DAILY FOLLOWED BY SEQUENTIAL MAINTENANCE TEMOZOLOMIDE X 6-12 CYCLES   Patient is on Antibody Plan: BRAIN GBM BEVACIZUMAB 14D X 6 CYCLES       Biomarkers:  MGMT Methylated.  IDH 1/2 Wild type.  EGFR Unknown  TERT Unknown   Interval History:  [name] presents today for third avastin infusion.  No new or progressive neurologic deficits.  Husband describes some slowing down of cognition and memory. No recurrence of seizure events since increasing Vimpat to 1053m200mg.  No longer on decadron.   Decadron 11/09/20: 59m19m2/10/22: 8mg94m/21/22: 6mg 37m07/22: 4mg 038m9/22: 8mg 04159m/22: 6mg 05/459m22: 3mg 05/25m2: 59mg 06/0667m: 1.5mg 06/20/13m -  H+P (01/20/20) Patient presented one month ago with sudden onset left arm and leg weakness and interrupted speech, [name], noticed poor grip on racket and could not express herself verbally.   This led to ED visit, stroke eval and CNS imaging which demonstrated a non-enhancing left frontal mass.  At this time she is back to baseline without recurrence of events, taking Keppra 500mg twice 54mday.  No history or seizure or any other neurologic events.  She does describe being sleep deprived prior to seizure event.  She presents today after one month follow up MRI scan.  Medications: Current Outpatient Medications on File Prior to Visit  Medication Sig Dispense Refill   apixaban (ELIQUIS) 5 MG TABS tablet TAKE 1 TABLET (5 MG TOTAL) BY MOUTH 2 (TWO) TIMES DAILY. 60 tablet 3   cholecalciferol (VITAMIN D3) 25 MCG (1000 UNIT) tablet Take 1,000 Units by mouth daily.     docusate sodium (COLACE) 100 MG capsule Take 100 mg by mouth 2 (two) times daily as needed for mild constipation.     Lacosamide 100 MG TABS Take 1 tablet by mouth in the AM (100mg) and 2 46mets by mouth at bedtime (200mg) 90 tabl51m   levETIRAcetam (KEPPRA) 500 MG tablet Take 2 tablets (1,000 mg total) by mouth 2 (two) times daily. 120 tablet 2   ondansetron (ZOFRAN) 8 MG tablet TAKE 1 TABLET BY MOUTH TWICE DAILY AS NEEDED FOR NAUSEA AND/OR VOMITING. MAY TAKE 30 TO 60 MINUTES PRIOR TO TEMODAR ADMINISTRATION IF NEEDED 30 tablet 0   temozolomide (TEMODAR) 100 MG capsule TAKE 1 CAPSULE (100 MG TOTAL) BY MOUTH DAILY. MAY TAKE ON AN EMPTY STOMACH TO DECREASE NAUSEA & VOMITING. 42 capsule 0   temozolomide (TEMODAR) 20 MG capsule TAKE 1 CAPSULE (20 MG TOTAL) BY  MOUTH DAILY. MAY TAKE ON AN EMPTY STOMACH TO DECREASE NAUSEA & VOMITING. 42 capsule 0   [DISCONTINUED] lamoTRIgine (LAMICTAL) 100 MG tablet Take 1 tablet (100 mg total) by mouth daily. 30 tablet 3   No current facility-administered medications on file prior to visit.    Allergies: No Known Allergies Past Medical History:  Past Medical History:  Diagnosis Date   Cervical spondylolysis 12/15/2019   Skeleton: Mild cervical spondylosis C6-7. Incidental find.    Colon polyp     GBM (glioblastoma multiforme) (HCC)    Left ankle sprain 10/07/2011   Seizure ([place])    partial    Tick bite 02/2020   Past Surgical History:  Past Surgical History:  Procedure Laterality Date   APPLICATION OF CRANIAL NAVIGATION Left 02/16/2020   Procedure: APPLICATION OF CRANIAL NAVIGATION;  Surgeon: Vallarie Mare, MD;  Location: [facility];  Service: Neurosurgery;  Laterality: Left;   CRANIOTOMY Left 02/16/2020   Procedure: LEFT FRONTAL CRANIOTOMY FOR BRAIN TUMOR;  Surgeon: Vallarie Mare, MD;  Location: [facility];  Service: Neurosurgery;  Laterality: Left;   FOOT SURGERY Left    bone spur - local anesthesia   IR ANGIOGRAM PULMONARY BILATERAL SELECTIVE  12/15/2020   IR ANGIOGRAM SELECTIVE EACH ADDITIONAL VESSEL  12/15/2020   IR ANGIOGRAM SELECTIVE EACH ADDITIONAL VESSEL  12/15/2020   IR IVC FILTER PLMT / S&I /IMG GUID/MOD SED  12/15/2020   IR THROMBECT PRIM MECH INIT (INCLU) MOD SED  12/15/2020   IR THROMBECT PRIM MECH INIT (INCLU) MOD SED  12/15/2020   RADIOLOGY WITH ANESTHESIA N/A 12/15/2020   Procedure: IR WITH ANESTHESIA;  Surgeon: Radiologist, Medication, MD;  Location: [facility];  Service: Radiology;  Laterality: N/A;   TONSILECTOMY/ADENOIDECTOMY WITH MYRINGOTOMY     tonsils and adenoids out only   WISDOM TOOTH EXTRACTION     Social History:  Social History   Socioeconomic History   Marital status: Married    Spouse name: Not on file   Number of children: 3   Years of education: Not on file   Highest education level: Not on file  Occupational History   Not on file  Tobacco Use   Smoking status: Former    Years: 2.00    Pack years: 0.00    Types: Cigarettes    Quit date: 11/05/1979    Years since quitting: 41.5   Smokeless tobacco: Never   Tobacco comments:    1/2 pack a week  Vaping Use   Vaping Use: Never used  Substance and Sexual Activity   Alcohol use: Yes    Alcohol/week: 0.0 standard drinks    Comment: 1 glass of wine weekly   Drug use: No   Sexual activity: Yes     Birth control/protection: Surgical  Other Topics Concern   Not on file  Social History Narrative   Not on file   Social Determinants of Health   Financial Resource Strain: Not on file  Food Insecurity: Not on file  Transportation Needs: Not on file  Physical Activity: Not on file  Stress: Not on file  Social Connections: Not on file  Intimate Partner Violence: Not on file   Family History:  Family History  Problem Relation Age of Onset   Hypertension Mother    Stroke Mother    Diabetes Mother    Colon cancer Father 26   Hypertension Sister    Colon polyps Sister    Heart disease Brother 12   Diabetes Maternal Uncle  Diabetes Maternal Uncle     Review of Systems: Constitutional: Doesn't report fevers, chills or abnormal weight loss Eyes: Doesn't report blurriness of vision Ears, nose, mouth, throat, and face: Doesn't report sore throat Respiratory: Chest wall pain Cardiovascular: Doesn't report palpitation, chest discomfort  Gastrointestinal:  Doesn't report nausea, constipation, diarrhea GU: Doesn't report incontinence Skin: Doesn't report skin rashes Neurological: Per HPI Musculoskeletal: R shoulder pain, limited ROM Behavioral/Psych: Doesn't report anxiety  Physical Exam: Vitals:   05/10/21 1242  BP: (!) 124/94  Pulse: 71  Resp: 18  Temp: 98.7 F (37.1 C)  SpO2: 100%   KPS: 60. General: Alert, cooperative, pleasant, in no acute distress Head: Normal EENT: No conjunctival injection or scleral icterus.  Lungs: Resp effort normal Cardiac: Regular rate Abdomen: Non-distended abdomen Skin: No rashes cyanosis or petechiae. Extremities: No clubbing or edema  Neurologic Exam: Mental Status: Awake, alert, attentive to examiner. Oriented to self and environment. Language is fluent with intact comprehension.  Occasional interruptions in fluency.  Cranial Nerves: Visual acuity is grossly normal. Visual fields are full. Extra-ocular movements intact. No  ptosis. Face is symmetric Motor: Tone and bulk are normal. Right leg 4/5, left leg 4+/5, proximal greater than distal weakness. Reflexes are symmetric, no pathologic reflexes present.  Sensory: Intact to light touch Gait: Deferred today  Labs: I have reviewed the data as listed    Component Value Date/Time   NA 139 04/23/2021 1212   K 3.4 (L) 04/23/2021 1212   CL 104 04/23/2021 1212   CO2 27 04/23/2021 1212   GLUCOSE 76 04/23/2021 1212   GLUCOSE 85 10/13/2006 1158   BUN 12 04/23/2021 1212   CREATININE 0.69 04/23/2021 1212   CALCIUM 8.8 (L) 04/23/2021 1212   PROT 6.4 (L) 04/23/2021 1212   ALBUMIN 3.7 04/23/2021 1212   AST 17 04/23/2021 1212   ALT 13 04/23/2021 1212   ALKPHOS 40 04/23/2021 1212   BILITOT 0.6 04/23/2021 1212   GFRNONAA >60 04/23/2021 1212   GFRAA >60 02/17/2020 0136   Lab Results  Component Value Date   WBC 5.5 04/23/2021   NEUTROABS 4.2 04/23/2021   HGB 13.4 04/23/2021   HCT 39.6 04/23/2021   MCV 96.8 04/23/2021   PLT 202 04/23/2021    Assessment/Plan GBM (glioblastoma multiforme) (HCC)  Seizure disorder (HCC)  Kyrielle A Hollings is clinically stable today, cleared to dose 4th cycle of Avastin.  Labs are within normal limits.  Vimpat should continue at 17m/200mg  We recommended continuing treatment with Avastin 166mkg IV q2 weeks.  Avastin will help treat inflammatory processes in the brain, and act as steroid sparing agent; of greater need given myopathic features noted today.  It may also help control progression of organic disease, if present.    Avastin should be held for the following:  ANC less than 500  Platelets less than 50,000  LFT or creatinine greater than 2x ULN  If clinical concerns/contraindications develop   We recommended utilizing humidifier at bedside for hoarseness secondary to avastin.  R should pain should be investigated by PCP or orthopedic provider.  Will continue to defer chemotherapy in the very near term due to  patient choice.    We ask that Merle A Criscuolo return to clinic in 3 weeks following next MRI brain.  All questions were answered. The patient knows to call the clinic with any problems, questions or concerns. No barriers to learning were detected.  I have spent a total of 30 minutes of face-to-face and non-face-to-face time,  excluding clinical staff time, preparing to see patient, ordering tests and/or medications, counseling the patient, and independently interpreting results and communicating results to the patient/family/caregiver    [name], MD Medical Director of Neuro-Oncology [facility] at [place] 05/10/21 12:27 PM

## 2021-05-10 NOTE — Patient Instructions (Signed)
[facility]ICAL [facility]COLOG[facility]  Discharge Instructions: °Thank you for choosing [facility] to provide your oncology and hematology care.  ° °If you have a lab appointment with the Cancer Center, please go directly to the Cancer Center and check in at the registration area. °  °Wear comfortable clothing and clothing appropriate for easy access to any Portacath or PICC line.  ° °We strive to give you quality time with your provider. You may need to reschedule your appointment if you arrive late (15 or more minutes).  Arriving late affects you and other patients whose appointments are after yours.  Also, if you miss three or more appointments without notifying the office, you may be dismissed from the clinic at the provider’s discretion.    °  °For prescription refill requests, have your pharmacy contact our office and allow 72 hours for refills to be completed.   ° °Today you received the following chemotherapy and/or immunotherapy agents: bevacizumab    °  °To help prevent nausea and vomiting after your treatment, we encourage you to take your nausea medication as directed. ° °BELOW ARE SYMPTOMS THAT SHOULD BE REPORTED IMMEDIATELY: °*FEVER GREATER THAN 100.4 F (38 °C) OR HIGHER °*CHILLS OR SWEATING °*NAUSEA AND VOMITING THAT IS NOT CONTROLLED WITH YOUR NAUSEA MEDICATION °*UNUSUAL SHORTNESS OF BREATH °*UNUSUAL BRUISING OR BLEEDING °*URINARY PROBLEMS (pain or burning when urinating, or frequent urination) °*BOWEL PROBLEMS (unusual diarrhea, constipation, pain near the anus) °TENDERNESS IN MOUTH AND THROAT WITH OR WITHOUT PRESENCE OF ULCERS (sore throat, sores in mouth, or a toothache) °UNUSUAL RASH, SWELLING OR PAIN  °UNUSUAL VAGINAL DISCHARGE OR ITCHING  ° °Items with * indicate a potential emergency and should be followed up as soon as possible or go to the Emergency Department if any problems should occur. ° °Please show the CHEMOTHERAPY ALERT CARD or IMMUNOTHERAPY ALERT CARD at check-in  to the Emergency Department and triage nurse. ° °Should you have questions after your visit or need to cancel or reschedule your appointment, please contact [facility]  Dept: [phone]  and follow the prompts.  Office hours are 8:00 a.m. to 4:30 p.m. Monday - Friday. Please note that voicemails left after 4:00 p.m. may not be returned until the following business day.  We are closed weekends and major holidays. You have access to a nurse at all times for urgent questions. Please call the main number to the clinic Dept: [phone] and follow the prompts. ° ° °For any non-urgent questions, you may also contact your provider using MyChart. We now offer e-Visits for anyone 18 and older to request care online for non-urgent symptoms. For details visit [email or web address]. °  °Also download the MyChart app! Go to the app store, search "MyChart", open the app, select [facility], and log in with your MyChart username and password. ° °Due to Covid, a mask is required upon entering the hospital/clinic. If you do not have a mask, one will be given to you upon arrival. For doctor visits, patients may have 1 support person aged 18 or older with them. For treatment visits, patients cannot have anyone with them due to current Covid guidelines and our immunocompromised population.  ° °

## 2021-05-15 ENCOUNTER — Other Ambulatory Visit (HOSPITAL_BASED_OUTPATIENT_CLINIC_OR_DEPARTMENT_OTHER): Payer: Self-pay

## 2021-05-18 ENCOUNTER — Telehealth: Payer: Self-pay | Admitting: *Deleted

## 2021-05-18 NOTE — Telephone Encounter (Signed)
Received call from pt's husband.  He is asking about pt's schedule for her Avastin.  She received Avastin on 05/10/21. Normally she would be due on 05/25/21 but is currently is scheduled on 05/21/21.  Husband wants to know if her current schedule going forward is correct.   Please advise

## 2021-05-21 ENCOUNTER — Other Ambulatory Visit: Payer: 59

## 2021-05-21 ENCOUNTER — Inpatient Hospital Stay: Payer: 59

## 2021-05-21 ENCOUNTER — Other Ambulatory Visit: Payer: Self-pay | Admitting: Hematology and Oncology

## 2021-05-21 ENCOUNTER — Other Ambulatory Visit: Payer: Self-pay | Admitting: *Deleted

## 2021-05-21 ENCOUNTER — Other Ambulatory Visit: Payer: Self-pay

## 2021-05-21 ENCOUNTER — Ambulatory Visit: Payer: 59 | Admitting: Internal Medicine

## 2021-05-21 VITALS — BP 131/86 | HR 70 | Temp 97.4°F | Resp 18 | Wt 137.5 lb

## 2021-05-21 DIAGNOSIS — Z86711 Personal history of pulmonary embolism: Secondary | ICD-10-CM | POA: Diagnosis not present

## 2021-05-21 DIAGNOSIS — Z5112 Encounter for antineoplastic immunotherapy: Secondary | ICD-10-CM | POA: Diagnosis not present

## 2021-05-21 DIAGNOSIS — C719 Malignant neoplasm of brain, unspecified: Secondary | ICD-10-CM

## 2021-05-21 DIAGNOSIS — Z79899 Other long term (current) drug therapy: Secondary | ICD-10-CM | POA: Diagnosis not present

## 2021-05-21 DIAGNOSIS — Z7901 Long term (current) use of anticoagulants: Secondary | ICD-10-CM | POA: Diagnosis not present

## 2021-05-21 DIAGNOSIS — C711 Malignant neoplasm of frontal lobe: Secondary | ICD-10-CM | POA: Diagnosis not present

## 2021-05-21 DIAGNOSIS — Z87891 Personal history of nicotine dependence: Secondary | ICD-10-CM | POA: Diagnosis not present

## 2021-05-21 LAB — CBC WITH DIFFERENTIAL (CANCER CENTER ONLY)
Abs Immature Granulocytes: 0 10*3/uL (ref 0.00–0.07)
Basophils Absolute: 0 10*3/uL (ref 0.0–0.1)
Basophils Relative: 1 %
Eosinophils Absolute: 0.1 10*3/uL (ref 0.0–0.5)
Eosinophils Relative: 2 %
HCT: 42.1 % (ref 36.0–46.0)
Hemoglobin: 14.3 g/dL (ref 12.0–15.0)
Immature Granulocytes: 0 %
Lymphocytes Relative: 19 %
Lymphs Abs: 0.7 10*3/uL (ref 0.7–4.0)
MCH: 32.7 pg (ref 26.0–34.0)
MCHC: 34 g/dL (ref 30.0–36.0)
MCV: 96.3 fL (ref 80.0–100.0)
Monocytes Absolute: 0.5 10*3/uL (ref 0.1–1.0)
Monocytes Relative: 13 %
Neutro Abs: 2.5 10*3/uL (ref 1.7–7.7)
Neutrophils Relative %: 65 %
Platelet Count: 195 10*3/uL (ref 150–400)
RBC: 4.37 MIL/uL (ref 3.87–5.11)
RDW: 12.3 % (ref 11.5–15.5)
WBC Count: 3.7 10*3/uL — ABNORMAL LOW (ref 4.0–10.5)
nRBC: 0 % (ref 0.0–0.2)

## 2021-05-21 LAB — CMP (CANCER CENTER ONLY)
ALT: 7 U/L (ref 0–44)
AST: 14 U/L — ABNORMAL LOW (ref 15–41)
Albumin: 3.9 g/dL (ref 3.5–5.0)
Alkaline Phosphatase: 42 U/L (ref 38–126)
Anion gap: 10 (ref 5–15)
BUN: 8 mg/dL (ref 6–20)
CO2: 25 mmol/L (ref 22–32)
Calcium: 9.9 mg/dL (ref 8.9–10.3)
Chloride: 105 mmol/L (ref 98–111)
Creatinine: 0.73 mg/dL (ref 0.44–1.00)
GFR, Estimated: >60 mL/min (ref >60)
Glucose, Bld: 92 mg/dL (ref 70–99)
Potassium: 3.7 mmol/L (ref 3.5–5.1)
Sodium: 140 mmol/L (ref 135–145)
Total Bilirubin: 0.6 mg/dL (ref 0.3–1.2)
Total Protein: 7 g/dL (ref 6.5–8.1)

## 2021-05-21 LAB — TOTAL PROTEIN, URINE DIPSTICK: Protein, ur: <30 mg/dL — AB

## 2021-05-21 MED ORDER — SODIUM CHLORIDE 0.9 % IV SOLN
Freq: Once | INTRAVENOUS | Status: DC
  Filled 2021-05-21: qty 250

## 2021-05-21 MED ORDER — SODIUM CHLORIDE 0.9 % IV SOLN
600.0000 mg | Freq: Once | INTRAVENOUS | Status: AC
  Administered 2021-05-21: 600 mg via INTRAVENOUS
  Filled 2021-05-21: qty 24

## 2021-05-21 NOTE — Progress Notes (Signed)
Ok to adjust bevacizumab to 600 mg (10 mg/kg) with new weight 62.4 kg.  T.O. Dr Kathee Delton, PharmD

## 2021-05-21 NOTE — Patient Instructions (Signed)
[facility]  Discharge Instructions: °Thank you for choosing [facility] to provide your oncology and hematology care.  ° °If you have a lab appointment with the Cancer Center, please go directly to the Cancer Center and check in at the registration area. °  °Wear comfortable clothing and clothing appropriate for easy access to any Portacath or PICC line.  ° °We strive to give you quality time with your provider. You may need to reschedule your appointment if you arrive late (15 or more minutes).  Arriving late affects you and other patients whose appointments are after yours.  Also, if you miss three or more appointments without notifying the office, you may be dismissed from the clinic at the provider’s discretion.    °  °For prescription refill requests, have your pharmacy contact our office and allow 72 hours for refills to be completed.   ° °Today you received the following chemotherapy and/or immunotherapy agents: bevacizumab    °  °To help prevent nausea and vomiting after your treatment, we encourage you to take your nausea medication as directed. ° °BELOW ARE SYMPTOMS THAT SHOULD BE REPORTED IMMEDIATELY: °*FEVER GREATER THAN 100.4 F (38 °C) OR HIGHER °*CHILLS OR SWEATING °*NAUSEA AND VOMITING THAT IS NOT CONTROLLED WITH YOUR NAUSEA MEDICATION °*UNUSUAL SHORTNESS OF BREATH °*UNUSUAL BRUISING OR BLEEDING °*URINARY PROBLEMS (pain or burning when urinating, or frequent urination) °*BOWEL PROBLEMS (unusual diarrhea, constipation, pain near the anus) °TENDERNESS IN MOUTH AND THROAT WITH OR WITHOUT PRESENCE OF ULCERS (sore throat, sores in mouth, or a toothache) °UNUSUAL RASH, SWELLING OR PAIN  °UNUSUAL VAGINAL DISCHARGE OR ITCHING  ° °Items with * indicate a potential emergency and should be followed up as soon as possible or go to the Emergency Department if any problems should occur. ° °Please show the CHEMOTHERAPY ALERT CARD or IMMUNOTHERAPY ALERT CARD at check-in  to the Emergency Department and triage nurse. ° °Should you have questions after your visit or need to cancel or reschedule your appointment, please contact [facility]  Dept: [phone]  and follow the prompts.  Office hours are 8:00 a.m. to 4:30 p.m. Monday - Friday. Please note that voicemails left after 4:00 p.m. may not be returned until the following business day.  We are closed weekends and major holidays. You have access to a nurse at all times for urgent questions. Please call the main number to the clinic Dept: [phone] and follow the prompts. ° ° °For any non-urgent questions, you may also contact your provider using MyChart. We now offer e-Visits for anyone 18 and older to request care online for non-urgent symptoms. For details visit [email or web address]. °  °Also download the MyChart app! Go to the app store, search "MyChart", open the app, select [facility], and log in with your MyChart username and password. ° °Due to Covid, a mask is required upon entering the hospital/clinic. If you do not have a mask, one will be given to you upon arrival. For doctor visits, patients may have 1 support person aged 18 or older with them. For treatment visits, patients cannot have anyone with them due to current Covid guidelines and our immunocompromised population.  ° °

## 2021-05-25 ENCOUNTER — Other Ambulatory Visit: Payer: Self-pay

## 2021-05-25 ENCOUNTER — Ambulatory Visit
Admission: RE | Admit: 2021-05-25 | Discharge: 2021-05-25 | Disposition: A | Discharge status: Home / Self Care | Payer: 59 | Source: Ambulatory Visit | Attending: Internal Medicine | Admitting: Internal Medicine

## 2021-05-25 ENCOUNTER — Other Ambulatory Visit (HOSPITAL_BASED_OUTPATIENT_CLINIC_OR_DEPARTMENT_OTHER): Payer: Self-pay

## 2021-05-25 ENCOUNTER — Other Ambulatory Visit: Payer: 59

## 2021-05-25 DIAGNOSIS — C719 Malignant neoplasm of brain, unspecified: Secondary | ICD-10-CM | POA: Diagnosis not present

## 2021-05-25 DIAGNOSIS — G40909 Epilepsy, unspecified, not intractable, without status epilepticus: Secondary | ICD-10-CM

## 2021-05-25 MED ORDER — GADOBENATE DIMEGLUMINE 529 MG/ML IV SOLN
12.0000 mL | Freq: Once | INTRAVENOUS | Status: AC | PRN
  Administered 2021-05-25: 12 mL via INTRAVENOUS

## 2021-05-28 ENCOUNTER — Ambulatory Visit: Payer: 59

## 2021-05-28 ENCOUNTER — Ambulatory Visit: Payer: 59 | Admitting: Internal Medicine

## 2021-05-28 ENCOUNTER — Other Ambulatory Visit: Payer: 59

## 2021-05-28 ENCOUNTER — Inpatient Hospital Stay: Payer: 59

## 2021-05-28 ENCOUNTER — Other Ambulatory Visit (HOSPITAL_BASED_OUTPATIENT_CLINIC_OR_DEPARTMENT_OTHER): Payer: Self-pay

## 2021-06-01 DIAGNOSIS — C719 Malignant neoplasm of brain, unspecified: Secondary | ICD-10-CM | POA: Diagnosis not present

## 2021-06-05 ENCOUNTER — Ambulatory Visit: Payer: 59 | Admitting: Internal Medicine

## 2021-06-05 ENCOUNTER — Other Ambulatory Visit: Payer: Self-pay

## 2021-06-05 ENCOUNTER — Other Ambulatory Visit: Payer: 59

## 2021-06-05 ENCOUNTER — Inpatient Hospital Stay: Payer: 59

## 2021-06-05 ENCOUNTER — Inpatient Hospital Stay: Payer: 59 | Attending: Internal Medicine

## 2021-06-05 ENCOUNTER — Ambulatory Visit: Payer: 59

## 2021-06-05 ENCOUNTER — Telehealth: Payer: Self-pay | Admitting: *Deleted

## 2021-06-05 ENCOUNTER — Inpatient Hospital Stay (HOSPITAL_BASED_OUTPATIENT_CLINIC_OR_DEPARTMENT_OTHER): Payer: 59 | Admitting: Internal Medicine

## 2021-06-05 VITALS — BP 167/86 | HR 66 | Temp 98.0°F | Resp 18 | Wt 150.4 lb

## 2021-06-05 VITALS — BP 124/84 | HR 73 | Resp 17

## 2021-06-05 DIAGNOSIS — G40909 Epilepsy, unspecified, not intractable, without status epilepticus: Secondary | ICD-10-CM | POA: Insufficient documentation

## 2021-06-05 DIAGNOSIS — Z79899 Other long term (current) drug therapy: Secondary | ICD-10-CM | POA: Diagnosis not present

## 2021-06-05 DIAGNOSIS — C719 Malignant neoplasm of brain, unspecified: Secondary | ICD-10-CM

## 2021-06-05 DIAGNOSIS — C711 Malignant neoplasm of frontal lobe: Secondary | ICD-10-CM | POA: Diagnosis not present

## 2021-06-05 DIAGNOSIS — Z7901 Long term (current) use of anticoagulants: Secondary | ICD-10-CM | POA: Insufficient documentation

## 2021-06-05 DIAGNOSIS — Z5112 Encounter for antineoplastic immunotherapy: Secondary | ICD-10-CM | POA: Diagnosis not present

## 2021-06-05 LAB — CMP (CANCER CENTER ONLY)
ALT: 7 U/L (ref 0–44)
AST: 14 U/L — ABNORMAL LOW (ref 15–41)
Albumin: 3.8 g/dL (ref 3.5–5.0)
Alkaline Phosphatase: 44 U/L (ref 38–126)
Anion gap: 8 (ref 5–15)
BUN: 6 mg/dL (ref 6–20)
CO2: 27 mmol/L (ref 22–32)
Calcium: 9.8 mg/dL (ref 8.9–10.3)
Chloride: 105 mmol/L (ref 98–111)
Creatinine: 0.76 mg/dL (ref 0.44–1.00)
GFR, Estimated: >60 mL/min (ref >60)
Glucose, Bld: 82 mg/dL (ref 70–99)
Potassium: 3.9 mmol/L (ref 3.5–5.1)
Sodium: 140 mmol/L (ref 135–145)
Total Bilirubin: 0.5 mg/dL (ref 0.3–1.2)
Total Protein: 6.4 g/dL — ABNORMAL LOW (ref 6.5–8.1)

## 2021-06-05 LAB — CBC WITH DIFFERENTIAL (CANCER CENTER ONLY)
Abs Immature Granulocytes: 0.01 10*3/uL (ref 0.00–0.07)
Basophils Absolute: 0 10*3/uL (ref 0.0–0.1)
Basophils Relative: 1 %
Eosinophils Absolute: 0.1 10*3/uL (ref 0.0–0.5)
Eosinophils Relative: 2 %
HCT: 42 % (ref 36.0–46.0)
Hemoglobin: 14.4 g/dL (ref 12.0–15.0)
Immature Granulocytes: 0 %
Lymphocytes Relative: 22 %
Lymphs Abs: 0.7 10*3/uL (ref 0.7–4.0)
MCH: 32.9 pg (ref 26.0–34.0)
MCHC: 34.3 g/dL (ref 30.0–36.0)
MCV: 95.9 fL (ref 80.0–100.0)
Monocytes Absolute: 0.5 10*3/uL (ref 0.1–1.0)
Monocytes Relative: 14 %
Neutro Abs: 2.1 10*3/uL (ref 1.7–7.7)
Neutrophils Relative %: 61 %
Platelet Count: 202 10*3/uL (ref 150–400)
RBC: 4.38 MIL/uL (ref 3.87–5.11)
RDW: 11.9 % (ref 11.5–15.5)
WBC Count: 3.4 10*3/uL — ABNORMAL LOW (ref 4.0–10.5)
nRBC: 0 % (ref 0.0–0.2)

## 2021-06-05 MED ORDER — SODIUM CHLORIDE 0.9 % IV SOLN
700.0000 mg | Freq: Once | INTRAVENOUS | Status: AC
  Administered 2021-06-05: 700 mg via INTRAVENOUS
  Filled 2021-06-05: qty 12

## 2021-06-05 MED ORDER — SODIUM CHLORIDE 0.9 % IV SOLN
Freq: Once | INTRAVENOUS | Status: AC
  Filled 2021-06-05: qty 250

## 2021-06-05 NOTE — Telephone Encounter (Signed)
Ok to treat 06/05/2021 with Avastin biosimilar today without urine protein and with elevated blood pressure reading per Dr Mickeal Skinner.

## 2021-06-05 NOTE — Progress Notes (Signed)
[facility] [address], [place] [address] [phone]   Interval Evaluation  Date of Service: 06/05/21 Patient Name: Hannah [name] Patient MRN: [id number] Patient DOB: 1961-02-16 Provider: [name], MD  Identifying Statement:  Hannah [name] is a 60 y.o. female with left frontal glioblastoma   Oncologic History: Oncology History  GBM (glioblastoma multiforme) ([facility])   Initial Diagnosis   GBM (glioblastoma multiforme) ([facility])   02/17/2020 Surgery   Craniotomy, left frontal resection by Dr. Marcello Moores.  Path is glioblastoma.   08/28/2020 - 10/09/2020 Radiation Therapy   Radiation and concurrent Temozolomide 38m/m2 daily   11/10/2020 -  Chemotherapy   Initiates cycle #1 5-day Temozolomide      [facility]   [facility] for [facility], undergoes thrombectomy and IVC filter placement   03/27/2021 -  Chemotherapy    Patient is on Treatment Plan: BRAIN GLIOBLASTOMA RADIATION THERAPY WITH CONCURRENT TEMOZOLOMIDE 75 MG/M2 DAILY FOLLOWED BY SEQUENTIAL MAINTENANCE TEMOZOLOMIDE X 6-12 CYCLES   Patient is on Antibody Plan: BRAIN GBM BEVACIZUMAB 14D X 6 CYCLES       Biomarkers:  MGMT Methylated.  IDH 1/2 Wild type.  EGFR Unknown  TERT Unknown   Interval History:  Hannah [name] presents today for follow up after recent MRI brain.  No new or progressive neurologic deficits.  She and her husband continue to describe some slowing down of cognition and memory.  Functional status is limited to independent feeding, requires help with all other ADLs.  Continues on Vimpat to 1069m200mg without breakthrough seizures.  No longer on decadron.   Decadron 11/09/20: 85m56m2/10/22: 8mg1m/21/22: 6mg 81m07/22: 4mg 034m9/22: 8mg 0475m/22: 6mg 05/9m22: 3mg 05/253m2: 85mg 06/0654m: 1.5mg 06/20/54m -  H+P (01/20/20) Patient presented one month ago with sudden onset left arm and leg weakness and interrupted speech,  [name], [name], stroke eval and CNS imaging which demonstrated a non-enhancing left frontal mass.  At this time she is back to baseline without recurrence of events, taking Keppra 500mg twice 65mday.  No history or seizure or any other neurologic events.  She does describe being sleep deprived prior to seizure event.  She presents today after one month follow up MRI scan.  Medications: Current Outpatient Medications on File Prior to Visit  Medication Sig Dispense Refill   apixaban (ELIQUIS) 5 MG TABS tablet TAKE 1 TABLET (5 MG TOTAL) BY MOUTH 2 (TWO) TIMES DAILY. 60 tablet 3   cholecalciferol (VITAMIN D3) 25 MCG (1000 UNIT) tablet Take 1,000 Units by mouth daily.     docusate sodium (COLACE) 100 MG capsule Take 100 mg by mouth 2 (two) times daily as needed for mild constipation.     Lacosamide 100 MG TABS Take 1 tablet by mouth in the AM (100mg) and 2 6mets by mouth at bedtime (200mg) 90 tabl23m   ondansetron (ZOFRAN) 8 MG tablet TAKE 1 TABLET BY MOUTH TWICE DAILY AS NEEDED FOR NAUSEA AND/OR VOMITING. MAY TAKE 30 TO 60 MINUTES PRIOR TO TEMODAR ADMINISTRATION IF NEEDED (Patient not taking: Reported on 05/10/2021) 30 tablet 0   temozolomide (TEMODAR) 100 MG capsule TAKE 1 CAPSULE (100 MG TOTAL) BY MOUTH DAILY. MAY TAKE ON AN EMPTY STOMACH TO DECREASE NAUSEA & VOMITING. 42 capsule 0   temozolomide (TEMODAR) 20 MG capsule TAKE 1  CAPSULE (20 MG TOTAL) BY MOUTH DAILY. MAY TAKE ON AN EMPTY STOMACH TO DECREASE NAUSEA & VOMITING. (Patient not taking: Reported on 05/10/2021) 42 capsule 0   [DISCONTINUED] lamoTRIgine (LAMICTAL) 100 MG tablet Take 1 tablet (100 mg total) by mouth daily. 30 tablet 3   No current facility-administered medications on file prior to visit.    Allergies: No Known Allergies Past Medical History:  Past Medical History:  Diagnosis Date   Cervical spondylolysis  12/15/2019   Skeleton: Mild cervical spondylosis C6-7. Incidental find.    Colon polyp    GBM (glioblastoma multiforme) (H[facility])    Left ankle sprain 10/07/2011   Seizure ([facility])    partial    Tick bite 02/2020   Past Surgical History:  Past Surgical History:  Procedure Laterality Date   APPLICATION OF CRANIAL NAVIGATION Left 02/16/2020   Procedure: APPLICATION OF CRANIAL NAVIGATION;  Surgeon: Vallarie Mare, MD;  Location: [facility];  Service: Neurosurgery;  Laterality: Left;   CRANIOTOMY Left 02/16/2020   Procedure: LEFT FRONTAL CRANIOTOMY FOR BRAIN TUMOR;  Surgeon: Vallarie Mare, MD;  Location: [facility];  Service: Neurosurgery;  Laterality: Left;   FOOT SURGERY Left    bone spur - local anesthesia   IR ANGIOGRAM PULMONARY BILATERAL SELECTIVE  12/15/2020   IR ANGIOGRAM SELECTIVE EACH ADDITIONAL VESSEL  12/15/2020   IR ANGIOGRAM SELECTIVE EACH ADDITIONAL VESSEL  12/15/2020   IR IVC FILTER PLMT / S&I /IMG GUID/MOD SED  12/15/2020   IR THROMBECT PRIM MECH INIT (INCLU) MOD SED  12/15/2020   IR THROMBECT PRIM MECH INIT (INCLU) MOD SED  12/15/2020   RADIOLOGY WITH ANESTHESIA N/A 12/15/2020   Procedure: IR WITH ANESTHESIA;  Surgeon: Radiologist, Medication, MD;  Location: [facility];  Service: Radiology;  Laterality: N/A;   TONSILECTOMY/ADENOIDECTOMY WITH MYRINGOTOMY     tonsils and adenoids out only   WISDOM TOOTH EXTRACTION     Social History:  Social History   Socioeconomic History   Marital status: Married    Spouse name: Not on file   Number of children: 3   Years of education: Not on file   Highest education level: Not on file  Occupational History   Not on file  Tobacco Use   Smoking status: Former    Years: 2.00    Types: Cigarettes    Quit date: 11/05/1979    Years since quitting: 41.6   Smokeless tobacco: Never   Tobacco comments:    1/2 pack a week  Vaping Use   Vaping Use: Never used  Substance and Sexual Activity   Alcohol use: Yes    Alcohol/week: 0.0 standard drinks     Comment: 1 glass of wine weekly   Drug use: No   Sexual activity: Yes    Birth control/protection: Surgical  Other Topics Concern   Not on file  Social History Narrative   Not on file   Social Determinants of Health   Financial Resource Strain: Not on file  Food Insecurity: Not on file  Transportation Needs: Not on file  Physical Activity: Not on file  Stress: Not on file  Social Connections: Not on file  Intimate Partner Violence: Not on file   Family History:  Family History  Problem Relation Age of Onset   Hypertension Mother    Stroke Mother    Diabetes Mother    Colon cancer Father 68   Hypertension Sister    Colon polyps Sister    Heart disease Brother 38   Diabetes  Maternal Uncle    Diabetes Maternal Uncle     Review of Systems: Constitutional: Doesn't report fevers, chills or abnormal weight loss Eyes: Doesn't report blurriness of vision Ears, nose, mouth, throat, and face: Doesn't report sore throat Respiratory: Chest wall pain Cardiovascular: Doesn't report palpitation, chest discomfort  Gastrointestinal:  Doesn't report nausea, constipation, diarrhea GU: Doesn't report incontinence Skin: Doesn't report skin rashes Neurological: Per HPI Musculoskeletal: R shoulder pain, limited ROM Behavioral/Psych: Doesn't report anxiety  Physical Exam: Vitals:   06/05/21 1203  BP: (!) 167/86  Pulse: 66  Resp: 18  Temp: 98 F (36.7 C)  SpO2: 93%   KPS: 60. General: Alert, cooperative, pleasant, in no acute distress Head: Normal EENT: No conjunctival injection or scleral icterus.  Lungs: Resp effort normal Cardiac: Regular rate Abdomen: Non-distended abdomen Skin: No rashes cyanosis or petechiae. Extremities: No clubbing or edema  Neurologic Exam: Mental Status: Awake, alert, attentive to examiner. Oriented to self and environment. Language is fluent with intact comprehension.  Occasional interruptions in fluency.  Cranial Nerves: Visual acuity is grossly  normal. Visual fields are full. Extra-ocular movements intact. No ptosis. Face is symmetric Motor: Tone and bulk are normal. Right leg 4/5, left leg 4+/5, proximal greater than distal weakness. Reflexes are symmetric, no pathologic reflexes present.  Sensory: Intact to light touch Gait: Deferred today  Labs: I have reviewed the data as listed    Component Value Date/Time   NA 140 05/21/2021 1229   K 3.7 05/21/2021 1229   CL 105 05/21/2021 1229   CO2 25 05/21/2021 1229   GLUCOSE 92 05/21/2021 1229   GLUCOSE 85 10/13/2006 1158   BUN 8 05/21/2021 1229   CREATININE 0.73 05/21/2021 1229   CALCIUM 9.9 05/21/2021 1229   PROT 7.0 05/21/2021 1229   ALBUMIN 3.9 05/21/2021 1229   AST 14 (L) 05/21/2021 1229   ALT 7 05/21/2021 1229   ALKPHOS 42 05/21/2021 1229   BILITOT 0.6 05/21/2021 1229   GFRNONAA >60 05/21/2021 1229   GFRAA >60 02/17/2020 0136   Lab Results  Component Value Date   WBC 3.7 (L) 05/21/2021   NEUTROABS 2.5 05/21/2021   HGB 14.3 05/21/2021   HCT 42.1 05/21/2021   MCV 96.3 05/21/2021   PLT 195 05/21/2021   Imaging:  [facility] Clinician Interpretation: I have personally reviewed the CNS images as listed.  My interpretation, in the context of the patient's clinical presentation, is stable disease  MR BRAIN W WO CONTRAST  Result Date: 05/26/2021 CLINICAL DATA:  GBM. EXAM: MRI HEAD WITHOUT AND WITH CONTRAST TECHNIQUE: Multiplanar, multiecho pulse sequences of the brain and surrounding structures were obtained without and with intravenous contrast. CONTRAST:  42m MULTIHANCE GADOBENATE DIMEGLUMINE 529 MG/ML IV SOLN COMPARISON:  03/09/2021 FINDINGS: Brain: Mass spanning the cerebral hemispheres at the corpus callosum body with hemorrhagic and necrotic features. The enhancing rim has decreased in thickness and overall size is also diminished, 27 x 41 mm on axial postcontrast images as compared to 42 x 46 mm at the same level on prior. There is still generalized T2 hyperintensity in  the left more than right hemispheric white matter, but diminished in intensity and improved gyral swelling. Stable postoperative changes beneath the high left frontal craniotomy. No new area of involvement. No incidental infarct, hydrocephalus, or collection. Vascular: Normal flow voids and vascular enhancements Skull and upper cervical spine: Unremarkable craniotomy Sinuses/Orbits: Negative IMPRESSION: The glioblastoma shows decreased enhancement and T2 signal when compared to 03/09/2021. Electronically Signed   By: JAngelica Chessman  Watts M.D.   On: 05/26/2021 11:25     Assessment/Plan GBM (glioblastoma multiforme) (HCC)  Seizure disorder ([facility])  Reina A Chance is clinically and radiographically stable today, now having completed 5 cycles of avastin.  Labs are within normal limits, she was unable to provide urine today.  Vimpat should continue at 155m/200mg  We recommended continuing treatment with Avastin 143mkg IV q2 weeks.  Avastin will help treat inflammatory processes in the brain, and act as steroid sparing agent; of greater need given myopathic features noted today.  It may also help control progression of organic disease, if present.    Avastin should be held for the following:  ANC less than 500  Platelets less than 50,000  LFT or creatinine greater than 2x ULN  If clinical concerns/contraindications develop   Hypertension today is likely related anxiety surrounding MRI results, previously it had been generally normal.  If still elevated next visit we can add calcium channel blocker.  R should pain should be investigated by PCP or orthopedic provider.  Will continue to defer chemotherapy in the very near term due to patient preference and poor functional status.    We ask that Ohanna A Hands return to clinic in 2 weeks prior to next cycle of avastin.  All questions were answered. The patient knows to call the clinic with any problems, questions or concerns. No barriers to  learning were detected.  I have spent a total of 30 minutes of face-to-face and non-face-to-face time, excluding clinical staff time, preparing to see patient, ordering tests and/or medications, counseling the patient, and independently interpreting results and communicating results to the patient/family/caregiver    ZaVentura SellersMD Medical Director of Neuro-Oncology [facility]8/02/22 11:51 AM

## 2021-06-05 NOTE — Progress Notes (Signed)
Per Dr. Mickeal Skinner, "OK To Treat without Urine Protein sample today".

## 2021-06-05 NOTE — Progress Notes (Signed)
Patient with weight increase ok to modify dose based on today's weight 68.2 kg (10 mg/kg) rounded to 700 mg for vial size.  Thank you,  T.O. Dr Kathee Delton, PharmD

## 2021-06-05 NOTE — Patient Instructions (Signed)
[facility] ONCOL[facility]  Discharge Instructions: Thank you for choosing [facility] to provide your oncology and hematology care.   If you have a lab appointment with the [facility], please go directly to the [facility] and check in at the registration area.   Wear comfortable clothing and clothing appropriate for easy access to any Portacath or PICC line.   We strive to give you quality time with your provider. You may need to reschedule your appointment if you arrive late (15 or more minutes).  Arriving late affects you and other patients whose appointments are after yours.  Also, if you miss three or more appointments without notifying the office, you may be dismissed from the clinic at the provider's discretion.      For prescription refill requests, have your pharmacy contact our office and allow 72 hours for refills to be completed.    Today you received the following chemotherapy and/or immunotherapy agents Bevacizumab-awwb ([facility]).      To help prevent nausea and vomiting after your treatment, we encourage you to take your nausea medication as directed.  BELOW ARE SYMPTOMS THAT SHOULD BE REPORTED IMMEDIATELY: *FEVER GREATER THAN 100.4 F (38 C) OR HIGHER *CHILLS OR SWEATING *NAUSEA AND VOMITING THAT IS NOT CONTROLLED WITH YOUR NAUSEA MEDICATION *UNUSUAL SHORTNESS OF BREATH *UNUSUAL BRUISING OR BLEEDING *URINARY PROBLEMS (pain or burning when urinating, or frequent urination) *BOWEL PROBLEMS (unusual diarrhea, constipation, pain near the anus) TENDERNESS IN MOUTH AND THROAT WITH OR WITHOUT PRESENCE OF ULCERS (sore throat, sores in mouth, or a toothache) UNUSUAL RASH, SWELLING OR PAIN  UNUSUAL VAGINAL DISCHARGE OR ITCHING   Items with * indicate a potential emergency and should be followed up as soon as possible or go to the Emergency Department if any problems should occur.  Please show the CHEMOTHERAPY ALERT CARD or IMMUNOTHERAPY ALERT CARD  at check-in to the Emergency Department and triage nurse.  Should you have questions after your visit or need to cancel or reschedule your appointment, please contact [facility]  Dept: [phone]  and follow the prompts.  Office hours are 8:00 a.m. to 4:30 p.m. Monday - Friday. Please note that voicemails left after 4:00 p.m. may not be returned until the following business day.  We are closed weekends and major holidays. You have access to a nurse at all times for urgent questions. Please call the main number to the clinic Dept: [phone] and follow the prompts.   For any non-urgent questions, you may also contact your provider using MyChart. We now offer e-Visits for anyone 15 and older to request care online for non-urgent symptoms. For details visit [email or web address].   Also download the MyChart app! Go to the app store, search "MyChart", open the app, select [facility], and log in with your MyChart username and password.  Due to Covid, a mask is required upon entering the hospital/clinic. If you do not have a mask, one will be given to you upon arrival. For doctor visits, patients may have 1 support person aged 70 or older with them. For treatment visits, patients cannot have anyone with them due to current Covid guidelines and our immunocompromised population.

## 2021-06-11 ENCOUNTER — Other Ambulatory Visit: Payer: 59

## 2021-06-11 ENCOUNTER — Ambulatory Visit: Payer: 59 | Admitting: Internal Medicine

## 2021-06-11 ENCOUNTER — Ambulatory Visit: Payer: 59

## 2021-06-12 ENCOUNTER — Other Ambulatory Visit: Payer: Self-pay | Admitting: *Deleted

## 2021-06-13 ENCOUNTER — Other Ambulatory Visit (HOSPITAL_BASED_OUTPATIENT_CLINIC_OR_DEPARTMENT_OTHER): Payer: Self-pay

## 2021-06-18 ENCOUNTER — Inpatient Hospital Stay: Payer: 59

## 2021-06-18 ENCOUNTER — Other Ambulatory Visit: Payer: Self-pay

## 2021-06-18 ENCOUNTER — Other Ambulatory Visit (HOSPITAL_BASED_OUTPATIENT_CLINIC_OR_DEPARTMENT_OTHER): Payer: Self-pay

## 2021-06-18 ENCOUNTER — Inpatient Hospital Stay (HOSPITAL_BASED_OUTPATIENT_CLINIC_OR_DEPARTMENT_OTHER): Payer: 59 | Admitting: Internal Medicine

## 2021-06-18 VITALS — BP 135/93 | HR 84 | Temp 98.1°F | Resp 18 | Wt 133.0 lb

## 2021-06-18 DIAGNOSIS — Z7901 Long term (current) use of anticoagulants: Secondary | ICD-10-CM | POA: Diagnosis not present

## 2021-06-18 DIAGNOSIS — C719 Malignant neoplasm of brain, unspecified: Secondary | ICD-10-CM

## 2021-06-18 DIAGNOSIS — Z79899 Other long term (current) drug therapy: Secondary | ICD-10-CM | POA: Diagnosis not present

## 2021-06-18 DIAGNOSIS — G40909 Epilepsy, unspecified, not intractable, without status epilepticus: Secondary | ICD-10-CM | POA: Diagnosis not present

## 2021-06-18 DIAGNOSIS — Z5112 Encounter for antineoplastic immunotherapy: Secondary | ICD-10-CM | POA: Diagnosis not present

## 2021-06-18 DIAGNOSIS — C711 Malignant neoplasm of frontal lobe: Secondary | ICD-10-CM | POA: Diagnosis not present

## 2021-06-18 LAB — CBC WITH DIFFERENTIAL (CANCER CENTER ONLY)
Abs Immature Granulocytes: 0.01 10*3/uL (ref 0.00–0.07)
Basophils Absolute: 0 10*3/uL (ref 0.0–0.1)
Basophils Relative: 0 %
Eosinophils Absolute: 0 10*3/uL (ref 0.0–0.5)
Eosinophils Relative: 1 %
HCT: 43.1 % (ref 36.0–46.0)
Hemoglobin: 14.7 g/dL (ref 12.0–15.0)
Immature Granulocytes: 0 %
Lymphocytes Relative: 10 %
Lymphs Abs: 0.6 10*3/uL — ABNORMAL LOW (ref 0.7–4.0)
MCH: 32.2 pg (ref 26.0–34.0)
MCHC: 34.1 g/dL (ref 30.0–36.0)
MCV: 94.5 fL (ref 80.0–100.0)
Monocytes Absolute: 0.6 10*3/uL (ref 0.1–1.0)
Monocytes Relative: 10 %
Neutro Abs: 4.9 10*3/uL (ref 1.7–7.7)
Neutrophils Relative %: 79 %
Platelet Count: 186 10*3/uL (ref 150–400)
RBC: 4.56 MIL/uL (ref 3.87–5.11)
RDW: 11.9 % (ref 11.5–15.5)
WBC Count: 6.1 10*3/uL (ref 4.0–10.5)
nRBC: 0 % (ref 0.0–0.2)

## 2021-06-18 LAB — URINALYSIS, COMPLETE (UACMP) WITH MICROSCOPIC
Bilirubin Urine: NEGATIVE
Glucose, UA: NEGATIVE mg/dL
Ketones, ur: NEGATIVE mg/dL
Nitrite: NEGATIVE
Protein, ur: NEGATIVE mg/dL
Specific Gravity, Urine: 1.017 (ref 1.005–1.030)
WBC, UA: >50 WBC/hpf — ABNORMAL HIGH (ref 0–5)
pH: 5 (ref 5.0–8.0)

## 2021-06-18 LAB — CMP (CANCER CENTER ONLY)
ALT: 9 U/L (ref 0–44)
AST: 14 U/L — ABNORMAL LOW (ref 15–41)
Albumin: 3.9 g/dL (ref 3.5–5.0)
Alkaline Phosphatase: 46 U/L (ref 38–126)
Anion gap: 11 (ref 5–15)
BUN: 6 mg/dL (ref 6–20)
CO2: 24 mmol/L (ref 22–32)
Calcium: 9.7 mg/dL (ref 8.9–10.3)
Chloride: 104 mmol/L (ref 98–111)
Creatinine: 0.75 mg/dL (ref 0.44–1.00)
GFR, Estimated: >60 mL/min (ref >60)
Glucose, Bld: 110 mg/dL — ABNORMAL HIGH (ref 70–99)
Potassium: 3.8 mmol/L (ref 3.5–5.1)
Sodium: 139 mmol/L (ref 135–145)
Total Bilirubin: 0.5 mg/dL (ref 0.3–1.2)
Total Protein: 6.7 g/dL (ref 6.5–8.1)

## 2021-06-18 LAB — TOTAL PROTEIN, URINE DIPSTICK

## 2021-06-18 MED ORDER — SODIUM CHLORIDE 0.9 % IV SOLN
600.0000 mg | Freq: Once | INTRAVENOUS | Status: AC
  Administered 2021-06-18: 600 mg via INTRAVENOUS
  Filled 2021-06-18: qty 16

## 2021-06-18 MED ORDER — CIPROFLOXACIN HCL 500 MG PO TABS
500.0000 mg | ORAL_TABLET | Freq: Two times a day (BID) | ORAL | 0 refills | Status: DC
  Filled 2021-06-18: qty 14, 7d supply, fill #0

## 2021-06-18 MED ORDER — SODIUM CHLORIDE 0.9 % IV SOLN: Freq: Once | INTRAVENOUS | Status: AC

## 2021-06-18 NOTE — Progress Notes (Signed)
[facility] [place], [place] [address] [phone]   Interval Evaluation  Date of Service: 06/18/21 Patient Name: Hannah [name] Patient MRN: [id number] Patient DOB: 1961/01/12 Provider: [name], MD  Identifying Statement:  Hannah [name] is a 60 y.o. female with left frontal glioblastoma   Oncologic History: Oncology History  GBM (glioblastoma multiforme) ([place])   Initial Diagnosis   GBM (glioblastoma multiforme) ([place])   02/17/2020 Surgery   Craniotomy, left frontal resection by Dr. Marcello Moores.  Path is glioblastoma.   08/28/2020 - 10/09/2020 Radiation Therapy   Radiation and concurrent Temozolomide 53m/m2 daily   11/10/2020 - 12/09/2020 Chemotherapy   Completes 1 cycle of 5-day Temozolomide      [facility]   [facility] for [facility], undergoes thrombectomy and IVC filter placement   03/09/2021 Progression      03/27/2021 -  Chemotherapy   Initiates second line therapy with Avastin 184mkg IV q2 weeks      Biomarkers:  MGMT Methylated.  IDH 1/2 Wild type.  EGFR Unknown  TERT Unknown   Interval History:  Hannah [name] presents today for avastin infusion.  Today she desribe urinary pressure, burning, increased frequency and nocturia which is new.  Denies any fevers or systemic symptoms.  No new or progressive neurologic deficits.  Functional status is limited to independent feeding, requires help with all other ADLs.  Continues on Vimpat to 1001m00mg without breakthrough seizures.    Decadron 11/09/20: 2mg102m/10/22: 8mg 64m21/22: 6mg 047m7/22: 4mg 0353m/22: 8mg 04/63m22: 6mg 05/160m2: 3mg 05/23102m: 2mg 06/06/2m 1.5mg 06/20/246m-  H+P (01/20/20) Patient presented one month ago with sudden onset left arm and leg weakness and interrupted speech, [name], noticed poor grip on racket and could not express herself verbally.  This led to ED  visit, stroke eval and CNS imaging which demonstrated a non-enhancing left frontal mass.  At this time she is back to baseline without recurrence of events, taking Keppra 500mg twice p3may.  No history or seizure or any other neurologic events.  She does describe being sleep deprived prior to seizure event.  She presents today after one month follow up MRI scan.  Medications: Current Outpatient Medications on File Prior to Visit  Medication Sig Dispense Refill   apixaban (ELIQUIS) 5 MG TABS tablet TAKE 1 TABLET (5 MG TOTAL) BY MOUTH 2 (TWO) TIMES DAILY. 60 tablet 3   cholecalciferol (VITAMIN D3) 25 MCG (1000 UNIT) tablet Take 1,000 Units by mouth daily.     docusate sodium (COLACE) 100 MG capsule Take 100 mg by mouth 2 (two) times daily as needed for mild constipation.     Lacosamide 100 MG TABS Take 1 tablet by mouth in the AM (100mg) and 2 t60mts by mouth at bedtime (200mg) 90 table37m  ondansetron (ZOFRAN) 8 MG tablet TAKE 1 TABLET BY MOUTH TWICE DAILY AS NEEDED FOR NAUSEA AND/OR VOMITING. MAY TAKE 30 TO 60 MINUTES PRIOR TO TEMODAR ADMINISTRATION IF NEEDED (Patient not taking: No sig reported) 30 tablet 0   temozolomide (TEMODAR) 100 MG capsule TAKE 1 CAPSULE (100 MG TOTAL) BY MOUTH DAILY. MAY TAKE ON AN EMPTY STOMACH TO DECREASE NAUSEA & VOMITING. (Patient not taking: Reported on 06/05/2021) 42 capsule 0   temozolomide (TEMODAR) 20 MG capsule TAKE 1 CAPSULE (20 MG TOTAL) BY MOUTH DAILY. MAY TAKE ON AN EMPTY STOMACH TO DECREASE NAUSEA &  VOMITING. (Patient not taking: No sig reported) 42 capsule 0   [DISCONTINUED] lamoTRIgine (LAMICTAL) 100 MG tablet Take 1 tablet (100 mg total) by mouth daily. 30 tablet 3   No current facility-administered medications on file prior to visit.    Allergies: No Known Allergies Past Medical History:  Past Medical History:  Diagnosis Date   Cervical spondylolysis 12/15/2019   Skeleton: Mild cervical spondylosis C6-7. Incidental find.    Colon polyp    GBM  (glioblastoma multiforme) ([facility])    Left ankle sprain 10/07/2011   Seizure ([place])    partial    Tick bite 02/2020   Past Surgical History:  Past Surgical History:  Procedure Laterality Date   APPLICATION OF CRANIAL NAVIGATION Left 02/16/2020   Procedure: APPLICATION OF CRANIAL NAVIGATION;  Surgeon: Vallarie Mare, MD;  Location: [facility];  Service: Neurosurgery;  Laterality: Left;   CRANIOTOMY Left 02/16/2020   Procedure: LEFT FRONTAL CRANIOTOMY FOR BRAIN TUMOR;  Surgeon: Vallarie Mare, MD;  Location: [facility];  Service: Neurosurgery;  Laterality: Left;   FOOT SURGERY Left    bone spur - local anesthesia   IR ANGIOGRAM PULMONARY BILATERAL SELECTIVE  12/15/2020   IR ANGIOGRAM SELECTIVE EACH ADDITIONAL VESSEL  12/15/2020   IR ANGIOGRAM SELECTIVE EACH ADDITIONAL VESSEL  12/15/2020   IR IVC FILTER PLMT / S&I /IMG GUID/MOD SED  12/15/2020   IR THROMBECT PRIM MECH INIT (INCLU) MOD SED  12/15/2020   IR THROMBECT PRIM MECH INIT (INCLU) MOD SED  12/15/2020   RADIOLOGY WITH ANESTHESIA N/A 12/15/2020   Procedure: IR WITH ANESTHESIA;  Surgeon: Radiologist, Medication, MD;  Location: [facility];  Service: Radiology;  Laterality: N/A;   TONSILECTOMY/ADENOIDECTOMY WITH MYRINGOTOMY     tonsils and adenoids out only   WISDOM TOOTH EXTRACTION     Social History:  Social History   Socioeconomic History   Marital status: Married    Spouse name: Not on file   Number of children: 3   Years of education: Not on file   Highest education level: Not on file  Occupational History   Not on file  Tobacco Use   Smoking status: Former    Years: 2.00    Types: Cigarettes    Quit date: 11/05/1979    Years since quitting: 41.6   Smokeless tobacco: Never   Tobacco comments:    1/2 pack a week  Vaping Use   Vaping Use: Never used  Substance and Sexual Activity   Alcohol use: Yes    Alcohol/week: 0.0 standard drinks    Comment: 1 glass of wine weekly   Drug use: No   Sexual activity: Yes    Birth  control/protection: Surgical  Other Topics Concern   Not on file  Social History Narrative   Not on file   Social Determinants of Health   Financial Resource Strain: Not on file  Food Insecurity: Not on file  Transportation Needs: Not on file  Physical Activity: Not on file  Stress: Not on file  Social Connections: Not on file  Intimate Partner Violence: Not on file   Family History:  Family History  Problem Relation Age of Onset   Hypertension Mother    Stroke Mother    Diabetes Mother    Colon cancer Father 95   Hypertension Sister    Colon polyps Sister    Heart disease Brother 63   Diabetes Maternal Uncle    Diabetes Maternal Uncle     Review of Systems: Constitutional: Doesn't  report fevers, chills or abnormal weight loss Eyes: Doesn't report blurriness of vision Ears, nose, mouth, throat, and face: Doesn't report sore throat Respiratory: Chest wall pain Cardiovascular: Doesn't report palpitation, chest discomfort  Gastrointestinal:  Doesn't report nausea, constipation, diarrhea GU: Doesn't report incontinence Skin: Doesn't report skin rashes Neurological: Per HPI Musculoskeletal: R shoulder pain, limited ROM Behavioral/Psych: Doesn't report anxiety  Physical Exam: Vitals:   06/18/21 1048  BP: (!) 135/93  Pulse: 84  Resp: 18  Temp: 98.1 F (36.7 C)  SpO2: 98%   KPS: 60. General: Alert, cooperative, pleasant, in no acute distress Head: Normal EENT: No conjunctival injection or scleral icterus.  Lungs: Resp effort normal Cardiac: Regular rate Abdomen: Non-distended abdomen Skin: No rashes cyanosis or petechiae. Extremities: No clubbing or edema  Neurologic Exam: Mental Status: Awake, alert, attentive to examiner. Oriented to self and environment. Language is fluent with intact comprehension.  Occasional interruptions in fluency.  Cranial Nerves: Visual acuity is grossly normal. Visual fields are full. Extra-ocular movements intact. No ptosis. Face  is symmetric Motor: Tone and bulk are normal. Right leg 4/5, left leg 4+/5, proximal greater than distal weakness. Reflexes are symmetric, no pathologic reflexes present.  Sensory: Intact to light touch Gait: Deferred today  Labs: I have reviewed the data as listed    Component Value Date/Time   NA 140 06/05/2021 1148   K 3.9 06/05/2021 1148   CL 105 06/05/2021 1148   CO2 27 06/05/2021 1148   GLUCOSE 82 06/05/2021 1148   GLUCOSE 85 10/13/2006 1158   BUN 6 06/05/2021 1148   CREATININE 0.76 06/05/2021 1148   CALCIUM 9.8 06/05/2021 1148   PROT 6.4 (L) 06/05/2021 1148   ALBUMIN 3.8 06/05/2021 1148   AST 14 (L) 06/05/2021 1148   ALT 7 06/05/2021 1148   ALKPHOS 44 06/05/2021 1148   BILITOT 0.5 06/05/2021 1148   GFRNONAA >60 06/05/2021 1148   GFRAA >60 02/17/2020 0136   Lab Results  Component Value Date   WBC 6.1 06/18/2021   NEUTROABS 4.9 06/18/2021   HGB 14.7 06/18/2021   HCT 43.1 06/18/2021   MCV 94.5 06/18/2021   PLT 186 06/18/2021   Imaging:  [place] Clinician Interpretation: I have personally reviewed the CNS images as listed.  My interpretation, in the context of the patient's clinical presentation, is stable disease  MR BRAIN W WO CONTRAST  Result Date: 05/26/2021 CLINICAL DATA:  GBM. EXAM: MRI HEAD WITHOUT AND WITH CONTRAST TECHNIQUE: Multiplanar, multiecho pulse sequences of the brain and surrounding structures were obtained without and with intravenous contrast. CONTRAST:  20m MULTIHANCE GADOBENATE DIMEGLUMINE 529 MG/ML IV SOLN COMPARISON:  03/09/2021 FINDINGS: Brain: Mass spanning the cerebral hemispheres at the corpus callosum body with hemorrhagic and necrotic features. The enhancing rim has decreased in thickness and overall size is also diminished, 27 x 41 mm on axial postcontrast images as compared to 42 x 46 mm at the same level on prior. There is still generalized T2 hyperintensity in the left more than right hemispheric white matter, but diminished in intensity  and improved gyral swelling. Stable postoperative changes beneath the high left frontal craniotomy. No new area of involvement. No incidental infarct, hydrocephalus, or collection. Vascular: Normal flow voids and vascular enhancements Skull and upper cervical spine: Unremarkable craniotomy Sinuses/Orbits: Negative IMPRESSION: The glioblastoma shows decreased enhancement and T2 signal when compared to 03/09/2021. Electronically Signed   By: JMonte FantasiaM.D.   On: 05/26/2021 11:25     Assessment/Plan GBM (glioblastoma multiforme) ([place]  Seizure disorder ([place])  Airica A Carroll is clinically stable today.  We recommended continuing treatment with Avastin 37m/kg IV q2 weeks.  Avastin will help treat inflammatory processes in the brain, and act as steroid sparing agent; of greater need given myopathic features noted today.  It may also help control progression of organic disease, if present.    Avastin should be held for the following:  ANC less than 500  Platelets less than 50,000  LFT or creatinine greater than 2x ULN  If clinical concerns/contraindications develop   For urethritis symptoms, we will check UA and culture; treat with ciprofloxacin 5029mBID x7 days.    Will continue to defer chemotherapy in the very near term due to patient preference and poor functional status.    We ask that Joi A Peral return to clinic in 2 weeks prior to next cycle of avastin.  Next MRI will be scheduled for 07/27/21.  All questions were answered. The patient knows to call the clinic with any problems, questions or concerns. No barriers to learning were detected.  I have spent a total of 30 minutes of face-to-face and non-face-to-face time, excluding clinical staff time, preparing to see patient, ordering tests and/or medications, counseling the patient, and independently interpreting results and communicating results to the patient/family/caregiver    ZaVentura SellersMD Medical Director  of Neuro-Oncology [facility] WePierz8/15/22 10:43 AM

## 2021-06-18 NOTE — Patient Instructions (Signed)
[facility] MEDICAL [facility]COLOGY  Discharge Instructions: Thank you for choosing [facility] to provide your oncology and hematology care.   If you have a lab appointment with the Cancer Center, please go directly to the Cancer Center and check in at the registration area.   Wear comfortable clothing and clothing appropriate for easy access to any Portacath or PICC line.   We strive to give you quality time with your provider. You may need to reschedule your appointment if you arrive late (15 or more minutes).  Arriving late affects you and other patients whose appointments are after yours.  Also, if you miss three or more appointments without notifying the office, you may be dismissed from the clinic at the provider's discretion.      For prescription refill requests, have your pharmacy contact our office and allow 72 hours for refills to be completed.    Today you received the following chemotherapy and/or immunotherapy agents Avastin       To help prevent nausea and vomiting after your treatment, we encourage you to take your nausea medication as directed.  BELOW ARE SYMPTOMS THAT SHOULD BE REPORTED IMMEDIATELY: *FEVER GREATER THAN 100.4 F (38 C) OR HIGHER *CHILLS OR SWEATING *NAUSEA AND VOMITING THAT IS NOT CONTROLLED WITH YOUR NAUSEA MEDICATION *UNUSUAL SHORTNESS OF BREATH *UNUSUAL BRUISING OR BLEEDING *URINARY PROBLEMS (pain or burning when urinating, or frequent urination) *BOWEL PROBLEMS (unusual diarrhea, constipation, pain near the anus) TENDERNESS IN MOUTH AND THROAT WITH OR WITHOUT PRESENCE OF ULCERS (sore throat, sores in mouth, or a toothache) UNUSUAL RASH, SWELLING OR PAIN  UNUSUAL VAGINAL DISCHARGE OR ITCHING   Items with * indicate a potential emergency and should be followed up as soon as possible or go to the Emergency Department if any problems should occur.  Please show the CHEMOTHERAPY ALERT CARD or IMMUNOTHERAPY ALERT CARD at check-in to  the Emergency Department and triage nurse.  Should you have questions after your visit or need to cancel or reschedule your appointment, please contact [facility]COL[facility]  Dept: [phone]  and follow the prompts.  Office hours are 8:00 a.m. to 4:30 p.m. Monday - Friday. Please note that voicemails left after 4:00 p.m. may not be returned until the following business day.  We are closed weekends and major holidays. You have access to a nurse at all times for urgent questions. Please call the main number to the clinic Dept: [phone] and follow the prompts.   For any non-urgent questions, you may also contact your provider using MyChart. We now offer e-Visits for anyone 18 and older to request care online for non-urgent symptoms. For details visit [email or web address].   Also download the MyChart app! Go to the app store, search "MyChart", open the app, select [facility], and log in with your MyChart username and password.  Due to Covid, a mask is required upon entering the hospital/clinic. If you do not have a mask, one will be given to you upon arrival. For doctor visits, patients may have 1 support person aged 18 or older with them. For treatment visits, patients cannot have anyone with them due to current Covid guidelines and our immunocompromised population.   

## 2021-06-18 NOTE — Progress Notes (Signed)
Urine protein not collected.  MD aware.  MD states "we can go ahead and treat, but we do want to get urine before she leaves today.  She has urethritis symptoms, ordering a culture."

## 2021-06-20 LAB — URINE CULTURE: Culture: >=100000 — AB

## 2021-06-22 ENCOUNTER — Telehealth: Payer: Self-pay | Admitting: Internal Medicine

## 2021-06-22 NOTE — Telephone Encounter (Signed)
Scheduled per 08/15 los, patient has been called and notified regarding upcoming appointments.

## 2021-06-25 ENCOUNTER — Other Ambulatory Visit (HOSPITAL_BASED_OUTPATIENT_CLINIC_OR_DEPARTMENT_OTHER): Payer: Self-pay

## 2021-06-29 ENCOUNTER — Telehealth: Payer: Self-pay | Admitting: *Deleted

## 2021-06-29 ENCOUNTER — Other Ambulatory Visit (HOSPITAL_BASED_OUTPATIENT_CLINIC_OR_DEPARTMENT_OTHER): Payer: Self-pay

## 2021-06-29 ENCOUNTER — Other Ambulatory Visit: Payer: Self-pay | Admitting: Internal Medicine

## 2021-06-29 MED ORDER — CIPROFLOXACIN HCL 500 MG PO TABS
500.0000 mg | ORAL_TABLET | Freq: Two times a day (BID) | ORAL | 0 refills | Status: DC
  Filled 2021-06-29: qty 28, 14d supply, fill #0

## 2021-06-29 NOTE — Telephone Encounter (Signed)
Received call from pt's husband, Lanny Hurst. He states Hannah [name] has been treated for a UTI recently with Cipro for 1 week , started on 06/18/21  She completed the Cipro but is again symptomatic with urgency and frequency, feels uncomfortable when she voids.  Lanny Hurst is concerned about going into the weekend with recurrent UTI.  Please advise

## 2021-07-02 ENCOUNTER — Other Ambulatory Visit: Payer: Self-pay

## 2021-07-02 ENCOUNTER — Inpatient Hospital Stay: Payer: 59

## 2021-07-02 ENCOUNTER — Inpatient Hospital Stay (HOSPITAL_BASED_OUTPATIENT_CLINIC_OR_DEPARTMENT_OTHER): Payer: 59 | Admitting: Internal Medicine

## 2021-07-02 VITALS — BP 154/99 | HR 77 | Temp 97.9°F | Resp 15 | Ht 65.0 in | Wt 131.9 lb

## 2021-07-02 DIAGNOSIS — C719 Malignant neoplasm of brain, unspecified: Secondary | ICD-10-CM | POA: Diagnosis not present

## 2021-07-02 DIAGNOSIS — G40909 Epilepsy, unspecified, not intractable, without status epilepticus: Secondary | ICD-10-CM

## 2021-07-02 DIAGNOSIS — C711 Malignant neoplasm of frontal lobe: Secondary | ICD-10-CM | POA: Diagnosis not present

## 2021-07-02 DIAGNOSIS — Z79899 Other long term (current) drug therapy: Secondary | ICD-10-CM | POA: Diagnosis not present

## 2021-07-02 DIAGNOSIS — Z5112 Encounter for antineoplastic immunotherapy: Secondary | ICD-10-CM | POA: Diagnosis not present

## 2021-07-02 DIAGNOSIS — Z7901 Long term (current) use of anticoagulants: Secondary | ICD-10-CM | POA: Diagnosis not present

## 2021-07-02 LAB — URINALYSIS, COMPLETE (UACMP) WITH MICROSCOPIC
Bacteria, UA: NONE SEEN
Bilirubin Urine: NEGATIVE
Glucose, UA: NEGATIVE mg/dL
Hgb urine dipstick: NEGATIVE
Ketones, ur: NEGATIVE mg/dL
Leukocytes,Ua: NEGATIVE
Nitrite: NEGATIVE
Protein, ur: NEGATIVE mg/dL
Specific Gravity, Urine: 1.014 (ref 1.005–1.030)
pH: 6 (ref 5.0–8.0)

## 2021-07-02 LAB — CBC WITH DIFFERENTIAL (CANCER CENTER ONLY)
Abs Immature Granulocytes: 0 10*3/uL (ref 0.00–0.07)
Basophils Absolute: 0 10*3/uL (ref 0.0–0.1)
Basophils Relative: 0 %
Eosinophils Absolute: 0.1 10*3/uL (ref 0.0–0.5)
Eosinophils Relative: 2 %
HCT: 42.8 % (ref 36.0–46.0)
Hemoglobin: 14.9 g/dL (ref 12.0–15.0)
Immature Granulocytes: 0 %
Lymphocytes Relative: 19 %
Lymphs Abs: 0.7 10*3/uL (ref 0.7–4.0)
MCH: 32.3 pg (ref 26.0–34.0)
MCHC: 34.8 g/dL (ref 30.0–36.0)
MCV: 92.8 fL (ref 80.0–100.0)
Monocytes Absolute: 0.5 10*3/uL (ref 0.1–1.0)
Monocytes Relative: 12 %
Neutro Abs: 2.6 10*3/uL (ref 1.7–7.7)
Neutrophils Relative %: 67 %
Platelet Count: 206 10*3/uL (ref 150–400)
RBC: 4.61 MIL/uL (ref 3.87–5.11)
RDW: 11.9 % (ref 11.5–15.5)
WBC Count: 3.9 10*3/uL — ABNORMAL LOW (ref 4.0–10.5)
nRBC: 0 % (ref 0.0–0.2)

## 2021-07-02 LAB — CMP (CANCER CENTER ONLY)
ALT: 6 U/L (ref 0–44)
AST: 13 U/L — ABNORMAL LOW (ref 15–41)
Albumin: 3.9 g/dL (ref 3.5–5.0)
Alkaline Phosphatase: 41 U/L (ref 38–126)
Anion gap: 10 (ref 5–15)
BUN: 7 mg/dL (ref 6–20)
CO2: 26 mmol/L (ref 22–32)
Calcium: 9.5 mg/dL (ref 8.9–10.3)
Chloride: 104 mmol/L (ref 98–111)
Creatinine: 0.72 mg/dL (ref 0.44–1.00)
GFR, Estimated: >60 mL/min (ref >60)
Glucose, Bld: 93 mg/dL (ref 70–99)
Potassium: 3.9 mmol/L (ref 3.5–5.1)
Sodium: 140 mmol/L (ref 135–145)
Total Bilirubin: 0.4 mg/dL (ref 0.3–1.2)
Total Protein: 6.5 g/dL (ref 6.5–8.1)

## 2021-07-02 LAB — TOTAL PROTEIN, URINE DIPSTICK: Protein, ur: NEGATIVE mg/dL

## 2021-07-02 MED ORDER — SODIUM CHLORIDE 0.9 % IV SOLN
600.0000 mg | Freq: Once | INTRAVENOUS | Status: AC
  Administered 2021-07-02: 600 mg via INTRAVENOUS
  Filled 2021-07-02: qty 16

## 2021-07-02 MED ORDER — SODIUM CHLORIDE 0.9 % IV SOLN: Freq: Once | INTRAVENOUS | Status: AC

## 2021-07-02 NOTE — Progress Notes (Signed)
[facility] [place] [place], [place] [address] [phone]   Interval Evaluation  Date of Service: 07/02/21 Patient Name: Hannah [name] Patient MRN: [id number] Patient DOB: 1961-04-29 Provider: [name], MD  Identifying Statement:  Hannah [name] is a 60 y.o. female with left frontal glioblastoma   Oncologic History: Oncology History  GBM (glioblastoma multiforme) ([place])   Initial Diagnosis   GBM (glioblastoma multiforme) ([place])   02/17/2020 Surgery   Craniotomy, left frontal resection by Dr. Marcello Moores.  Path is glioblastoma.   08/28/2020 - 10/09/2020 Radiation Therapy   Radiation and concurrent Temozolomide 73m/m2 daily   11/10/2020 - 12/09/2020 Chemotherapy   Completes 1 cycle of 5-day Temozolomide      [facility]   [facility] for [facility], undergoes thrombectomy and IVC filter placement   03/09/2021 Progression      03/27/2021 -  Chemotherapy   Initiates second line therapy with Avastin 160mkg IV q2 weeks      Biomarkers:  MGMT Methylated.  IDH 1/2 Wild type.  EGFR Unknown  TERT Unknown   Interval History:  Hannah [name] presents today for avastin infusion.  Today she describes recurrence of urinary pressure, burning, increased frequency and nocturia, after initial improvement with th ciprofloxacin.  Denies any fevers or systemic symptoms.  No new or progressive neurologic deficits.  Functional status is limited to independent feeding, requires help with all other ADLs.  Continues on Vimpat to 10077m00mg without breakthrough seizures.    Decadron 11/09/20: 2mg62m/10/22: 8mg 39m21/22: 6mg 065m7/22: 4mg 0364m/22: 8mg 04/54m22: 6mg 05/150m2: 3mg 05/2364m: 2mg 06/06/22m 1.5mg 06/20/260m-  H+P (01/20/20) Patient presented one month ago with sudden onset left arm and leg weakness and interrupted speech, [name], noticed poor grip on racket and  could not express herself verbally.  This led to ED visit, stroke eval and CNS imaging which demonstrated a non-enhancing left frontal mass.  At this time she is back to baseline without recurrence of events, taking Keppra 500mg twice p41may.  No history or seizure or any other neurologic events.  She does describe being sleep deprived prior to seizure event.  She presents today after one month follow up MRI scan.  Medications: Current Outpatient Medications on File Prior to Visit  Medication Sig Dispense Refill   apixaban (ELIQUIS) 5 MG TABS tablet TAKE 1 TABLET (5 MG TOTAL) BY MOUTH 2 (TWO) TIMES DAILY. 60 tablet 3   cholecalciferol (VITAMIN D3) 25 MCG (1000 UNIT) tablet Take 1,000 Units by mouth daily.     ciprofloxacin (CIPRO) 500 MG tablet Take 1 tablet (500 mg total) by mouth 2 (two) times daily. 28 tablet 0   docusate sodium (COLACE) 100 MG capsule Take 100 mg by mouth 2 (two) times daily as needed for mild constipation.     Lacosamide 100 MG TABS Take 1 tablet by mouth in the AM (100mg) and 2 t65mts by mouth at bedtime (200mg) 90 table47m  ondansetron (ZOFRAN) 8 MG tablet TAKE 1 TABLET BY MOUTH TWICE DAILY AS NEEDED FOR NAUSEA AND/OR VOMITING. MAY TAKE 30 TO 60 MINUTES PRIOR TO TEMODAR ADMINISTRATION IF NEEDED (Patient not taking: No sig reported) 30 tablet 0   temozolomide (TEMODAR) 100 MG capsule TAKE 1 CAPSULE (100 MG TOTAL) BY MOUTH DAILY. MAY TAKE ON AN EMPTY STOMACH TO DECREASE NAUSEA & VOMITING. (Patient not taking: Reported on 06/05/2021) 42 capsule  0   temozolomide (TEMODAR) 20 MG capsule TAKE 1 CAPSULE (20 MG TOTAL) BY MOUTH DAILY. MAY TAKE ON AN EMPTY STOMACH TO DECREASE NAUSEA & VOMITING. (Patient not taking: No sig reported) 42 capsule 0   [DISCONTINUED] lamoTRIgine (LAMICTAL) 100 MG tablet Take 1 tablet (100 mg total) by mouth daily. 30 tablet 3   No current facility-administered medications on file prior to visit.    Allergies: No Known Allergies Past Medical History:   Past Medical History:  Diagnosis Date   Cervical spondylolysis 12/15/2019   Skeleton: Mild cervical spondylosis C6-7. Incidental find.    Colon polyp    GBM (glioblastoma multiforme) (H[facility])    Left ankle sprain 10/07/2011   Seizure ([facility] [place])    partial    Tick bite 02/2020   Past Surgical History:  Past Surgical History:  Procedure Laterality Date   APPLICATION OF CRANIAL NAVIGATION Left 02/16/2020   Procedure: APPLICATION OF CRANIAL NAVIGATION;  Surgeon: Vallarie Mare, MD;  Location: [facility];  Service: Neurosurgery;  Laterality: Left;   CRANIOTOMY Left 02/16/2020   Procedure: LEFT FRONTAL CRANIOTOMY FOR BRAIN TUMOR;  Surgeon: Vallarie Mare, MD;  Location: [facility];  Service: Neurosurgery;  Laterality: Left;   FOOT SURGERY Left    bone spur - local anesthesia   IR ANGIOGRAM PULMONARY BILATERAL SELECTIVE  12/15/2020   IR ANGIOGRAM SELECTIVE EACH ADDITIONAL VESSEL  12/15/2020   IR ANGIOGRAM SELECTIVE EACH ADDITIONAL VESSEL  12/15/2020   IR IVC FILTER PLMT / S&I /IMG GUID/MOD SED  12/15/2020   IR THROMBECT PRIM MECH INIT (INCLU) MOD SED  12/15/2020   IR THROMBECT PRIM MECH INIT (INCLU) MOD SED  12/15/2020   RADIOLOGY WITH ANESTHESIA N/A 12/15/2020   Procedure: IR WITH ANESTHESIA;  Surgeon: Radiologist, Medication, MD;  Location: [facility];  Service: Radiology;  Laterality: N/A;   TONSILECTOMY/ADENOIDECTOMY WITH MYRINGOTOMY     tonsils and adenoids out only   WISDOM TOOTH EXTRACTION     Social History:  Social History   Socioeconomic History   Marital status: Married    Spouse name: Not on file   Number of children: 3   Years of education: Not on file   Highest education level: Not on file  Occupational History   Not on file  Tobacco Use   Smoking status: Former    Years: 2.00    Types: Cigarettes    Quit date: 11/05/1979    Years since quitting: 41.6   Smokeless tobacco: Never   Tobacco comments:    1/2 pack a week  Vaping Use   Vaping Use: Never used  Substance and Sexual  Activity   Alcohol use: Yes    Alcohol/week: 0.0 standard drinks    Comment: 1 glass of wine weekly   Drug use: No   Sexual activity: Yes    Birth control/protection: Surgical  Other Topics Concern   Not on file  Social History Narrative   Not on file   Social Determinants of Health   Financial Resource Strain: Not on file  Food Insecurity: Not on file  Transportation Needs: Not on file  Physical Activity: Not on file  Stress: Not on file  Social Connections: Not on file  Intimate Partner Violence: Not on file   Family History:  Family History  Problem Relation Age of Onset   Hypertension Mother    Stroke Mother    Diabetes Mother    Colon cancer Father 4   Hypertension Sister    Colon polyps Sister  Heart disease Brother 96   Diabetes Maternal Uncle    Diabetes Maternal Uncle     Review of Systems: Constitutional: Doesn't report fevers, chills or abnormal weight loss Eyes: Doesn't report blurriness of vision Ears, nose, mouth, throat, and face: Doesn't report sore throat Respiratory: Chest wall pain Cardiovascular: Doesn't report palpitation, chest discomfort  Gastrointestinal:  Doesn't report nausea, constipation, diarrhea GU: Doesn't report incontinence Skin: Doesn't report skin rashes Neurological: Per HPI Musculoskeletal: R shoulder pain, limited ROM Behavioral/Psych: Doesn't report anxiety  Physical Exam: Vitals:   07/02/21 1223  BP: (!) 154/99  Pulse: 77  Resp: 15  Temp: 97.9 F (36.6 C)  SpO2: 98%   KPS: 60. General: Alert, cooperative, pleasant, in no acute distress Head: Normal EENT: No conjunctival injection or scleral icterus.  Lungs: Resp effort normal Cardiac: Regular rate Abdomen: Non-distended abdomen Skin: No rashes cyanosis or petechiae. Extremities: No clubbing or edema  Neurologic Exam: Mental Status: Awake, alert, attentive to examiner. Oriented to self and environment. Language is fluent with intact comprehension.   Occasional interruptions in fluency.  Cranial Nerves: Visual acuity is grossly normal. Visual fields are full. Extra-ocular movements intact. No ptosis. Face is symmetric Motor: Tone and bulk are normal. Right leg 4/5, left leg 4+/5, proximal greater than distal weakness. Reflexes are symmetric, no pathologic reflexes present.  Sensory: Intact to light touch Gait: Deferred today  Labs: I have reviewed the data as listed    Component Value Date/Time   NA 140 07/02/2021 1159   K 3.9 07/02/2021 1159   CL 104 07/02/2021 1159   CO2 26 07/02/2021 1159   GLUCOSE 93 07/02/2021 1159   GLUCOSE 85 10/13/2006 1158   BUN 7 07/02/2021 1159   CREATININE 0.72 07/02/2021 1159   CALCIUM 9.5 07/02/2021 1159   PROT 6.5 07/02/2021 1159   ALBUMIN 3.9 07/02/2021 1159   AST 13 (L) 07/02/2021 1159   ALT 6 07/02/2021 1159   ALKPHOS 41 07/02/2021 1159   BILITOT 0.4 07/02/2021 1159   GFRNONAA >60 07/02/2021 1159   GFRAA >60 02/17/2020 0136   Lab Results  Component Value Date   WBC 3.9 (L) 07/02/2021   NEUTROABS 2.6 07/02/2021   HGB 14.9 07/02/2021   HCT 42.8 07/02/2021   MCV 92.8 07/02/2021   PLT 206 07/02/2021     Assessment/Plan GBM (glioblastoma multiforme) (HCC)  Seizure disorder (HCC)  Wendi A Hing is clinically stable today.  We recommended continuing treatment with Avastin 16m/kg IV q2 weeks.  Avastin will help treat inflammatory processes in the brain, and act as steroid sparing agent; of greater need given myopathic features noted today.  It may also help control progression of organic disease, if present.    Avastin should be held for the following:  ANC less than 500  Platelets less than 50,000  LFT or creatinine greater than 2x ULN  If clinical concerns/contraindications develop   For recurrent urethritis symptoms, we will check UA and culture; if positive again will treat with ciprofloxacin 5012mBID x10-14 days assuming the same speciation.    For limited shoulder  ROM, pain, they will follow up with orthopedics.   Will continue to defer chemotherapy in the very near term due to patient preference and poor functional status.    We ask that Samhita A Janek return to clinic in 2 weeks prior to next cycle of avastin.  Next MRI will be scheduled for 07/27/21.  All questions were answered. The patient knows to call the clinic with any  problems, questions or concerns. No barriers to learning were detected.  I have spent a total of 30 minutes of face-to-face and non-face-to-face time, excluding clinical staff time, preparing to see patient, ordering tests and/or medications, counseling the patient, and independently interpreting results and communicating results to the patient/family/caregiver    [name], MD Medical Director of Neuro-Oncology [facility] [place] 07/02/21 12:47 PM

## 2021-07-02 NOTE — Patient Instructions (Signed)
[facility] MEDICAL ONCOLOGY  Discharge Instructions: Thank you for choosing [facility] to provide your oncology and hematology care.   If you have a lab appointment with the Cancer Center, please go directly to the Cancer Center and check in at the registration area.   Wear comfortable clothing and clothing appropriate for easy access to any Portacath or PICC line.   We strive to give you quality time with your provider. You may need to reschedule your appointment if you arrive late (15 or more minutes).  Arriving late affects you and other patients whose appointments are after yours.  Also, if you miss three or more appointments without notifying the office, you may be dismissed from the clinic at the provider's discretion.      For prescription refill requests, have your pharmacy contact our office and allow 72 hours for refills to be completed.    Today you received the following chemotherapy and/or immunotherapy agents Avastin       To help prevent nausea and vomiting after your treatment, we encourage you to take your nausea medication as directed.  BELOW ARE SYMPTOMS THAT SHOULD BE REPORTED IMMEDIATELY: *FEVER GREATER THAN 100.4 F (38 C) OR HIGHER *CHILLS OR SWEATING *NAUSEA AND VOMITING THAT IS NOT CONTROLLED WITH YOUR NAUSEA MEDICATION *UNUSUAL SHORTNESS OF BREATH *UNUSUAL BRUISING OR BLEEDING *URINARY PROBLEMS (pain or burning when urinating, or frequent urination) *BOWEL PROBLEMS (unusual diarrhea, constipation, pain near the anus) TENDERNESS IN MOUTH AND THROAT WITH OR WITHOUT PRESENCE OF ULCERS (sore throat, sores in mouth, or a toothache) UNUSUAL RASH, SWELLING OR PAIN  UNUSUAL VAGINAL DISCHARGE OR ITCHING   Items with * indicate a potential emergency and should be followed up as soon as possible or go to the Emergency Department if any problems should occur.  Please show the CHEMOTHERAPY ALERT CARD or IMMUNOTHERAPY ALERT CARD at check-in to  the Emergency Department and triage nurse.  Should you have questions after your visit or need to cancel or reschedule your appointment, please contact [facility]  Dept: [phone]  and follow the prompts.  Office hours are 8:00 a.m. to 4:30 p.m. Monday - Friday. Please note that voicemails left after 4:00 p.m. may not be returned until the following business day.  We are closed weekends and major holidays. You have access to a nurse at all times for urgent questions. Please call the main number to the clinic Dept: [phone] and follow the prompts.   For any non-urgent questions, you may also contact your provider using MyChart. We now offer e-Visits for anyone 18 and older to request care online for non-urgent symptoms. For details visit [email or web address].   Also download the MyChart app! Go to the app store, search "MyChart", open the app, select [facility], and log in with your MyChart username and password.  Due to Covid, a mask is required upon entering the hospital/clinic. If you do not have a mask, one will be given to you upon arrival. For doctor visits, patients may have 1 support person aged 18 or older with them. For treatment visits, patients cannot have anyone with them due to current Covid guidelines and our immunocompromised population.   

## 2021-07-03 LAB — URINE CULTURE: Culture: NO GROWTH

## 2021-07-11 ENCOUNTER — Other Ambulatory Visit: Payer: Self-pay | Admitting: Internal Medicine

## 2021-07-12 ENCOUNTER — Encounter: Payer: Self-pay | Admitting: Internal Medicine

## 2021-07-12 ENCOUNTER — Other Ambulatory Visit (HOSPITAL_BASED_OUTPATIENT_CLINIC_OR_DEPARTMENT_OTHER): Payer: Self-pay

## 2021-07-12 MED ORDER — APIXABAN 5 MG PO TABS
ORAL_TABLET | Freq: Two times a day (BID) | ORAL | 3 refills | Status: DC
  Filled 2021-07-12: qty 60, 30d supply, fill #0
  Filled 2021-08-14: qty 60, 30d supply, fill #1
  Filled 2021-09-12: qty 60, 30d supply, fill #2
  Filled 2021-10-02 – 2021-10-08 (×2): qty 60, 30d supply, fill #3

## 2021-07-13 ENCOUNTER — Other Ambulatory Visit (HOSPITAL_BASED_OUTPATIENT_CLINIC_OR_DEPARTMENT_OTHER): Payer: Self-pay

## 2021-07-16 ENCOUNTER — Other Ambulatory Visit (HOSPITAL_BASED_OUTPATIENT_CLINIC_OR_DEPARTMENT_OTHER): Payer: Self-pay

## 2021-07-16 ENCOUNTER — Inpatient Hospital Stay (HOSPITAL_BASED_OUTPATIENT_CLINIC_OR_DEPARTMENT_OTHER): Payer: 59 | Admitting: Internal Medicine

## 2021-07-16 ENCOUNTER — Inpatient Hospital Stay: Payer: 59

## 2021-07-16 ENCOUNTER — Other Ambulatory Visit: Payer: Self-pay

## 2021-07-16 ENCOUNTER — Inpatient Hospital Stay: Payer: 59 | Attending: Internal Medicine

## 2021-07-16 VITALS — BP 148/96 | HR 83 | Temp 98.2°F | Resp 17 | Ht 65.0 in | Wt 133.9 lb

## 2021-07-16 DIAGNOSIS — C719 Malignant neoplasm of brain, unspecified: Secondary | ICD-10-CM

## 2021-07-16 DIAGNOSIS — Z79899 Other long term (current) drug therapy: Secondary | ICD-10-CM | POA: Insufficient documentation

## 2021-07-16 DIAGNOSIS — Z7901 Long term (current) use of anticoagulants: Secondary | ICD-10-CM | POA: Diagnosis not present

## 2021-07-16 DIAGNOSIS — C711 Malignant neoplasm of frontal lobe: Secondary | ICD-10-CM | POA: Insufficient documentation

## 2021-07-16 DIAGNOSIS — G40909 Epilepsy, unspecified, not intractable, without status epilepticus: Secondary | ICD-10-CM | POA: Insufficient documentation

## 2021-07-16 DIAGNOSIS — Z5112 Encounter for antineoplastic immunotherapy: Secondary | ICD-10-CM | POA: Diagnosis not present

## 2021-07-16 LAB — CBC WITH DIFFERENTIAL (CANCER CENTER ONLY)
Abs Immature Granulocytes: 0.01 10*3/uL (ref 0.00–0.07)
Basophils Absolute: 0 10*3/uL (ref 0.0–0.1)
Basophils Relative: 1 %
Eosinophils Absolute: 0.1 10*3/uL (ref 0.0–0.5)
Eosinophils Relative: 2 %
HCT: 43 % (ref 36.0–46.0)
Hemoglobin: 15.2 g/dL — ABNORMAL HIGH (ref 12.0–15.0)
Immature Granulocytes: 0 %
Lymphocytes Relative: 16 %
Lymphs Abs: 0.6 10*3/uL — ABNORMAL LOW (ref 0.7–4.0)
MCH: 33.3 pg (ref 26.0–34.0)
MCHC: 35.3 g/dL (ref 30.0–36.0)
MCV: 94.1 fL (ref 80.0–100.0)
Monocytes Absolute: 0.4 10*3/uL (ref 0.1–1.0)
Monocytes Relative: 11 %
Neutro Abs: 2.7 10*3/uL (ref 1.7–7.7)
Neutrophils Relative %: 70 %
Platelet Count: 160 10*3/uL (ref 150–400)
RBC: 4.57 MIL/uL (ref 3.87–5.11)
RDW: 11.9 % (ref 11.5–15.5)
WBC Count: 3.8 10*3/uL — ABNORMAL LOW (ref 4.0–10.5)
nRBC: 0 % (ref 0.0–0.2)

## 2021-07-16 LAB — CMP (CANCER CENTER ONLY)
ALT: 9 U/L (ref 0–44)
AST: 14 U/L — ABNORMAL LOW (ref 15–41)
Albumin: 4 g/dL (ref 3.5–5.0)
Alkaline Phosphatase: 43 U/L (ref 38–126)
Anion gap: 11 (ref 5–15)
BUN: 10 mg/dL (ref 6–20)
CO2: 23 mmol/L (ref 22–32)
Calcium: 10 mg/dL (ref 8.9–10.3)
Chloride: 109 mmol/L (ref 98–111)
Creatinine: 0.72 mg/dL (ref 0.44–1.00)
GFR, Estimated: >60 mL/min (ref >60)
Glucose, Bld: 100 mg/dL — ABNORMAL HIGH (ref 70–99)
Potassium: 3.8 mmol/L (ref 3.5–5.1)
Sodium: 143 mmol/L (ref 135–145)
Total Bilirubin: 0.4 mg/dL (ref 0.3–1.2)
Total Protein: 6.7 g/dL (ref 6.5–8.1)

## 2021-07-16 LAB — TOTAL PROTEIN, URINE DIPSTICK: Protein, ur: NEGATIVE mg/dL

## 2021-07-16 MED ORDER — METHYLPHENIDATE HCL 5 MG PO TABS
5.0000 mg | ORAL_TABLET | Freq: Two times a day (BID) | ORAL | 0 refills | Status: DC
  Filled 2021-07-16: qty 60, 30d supply, fill #0

## 2021-07-16 MED ORDER — SODIUM CHLORIDE 0.9 % IV SOLN
600.0000 mg | Freq: Once | INTRAVENOUS | Status: AC
  Administered 2021-07-16: 600 mg via INTRAVENOUS
  Filled 2021-07-16: qty 16

## 2021-07-16 MED ORDER — SODIUM CHLORIDE 0.9 % IV SOLN: Freq: Once | INTRAVENOUS | Status: DC

## 2021-07-16 NOTE — Progress Notes (Signed)
[facility] at [address] [place], [place] [address] [phone]   Interval Evaluation  Date of Service: 07/16/21 Patient Name: Hannah [name] Patient MRN: [id number] Patient DOB: 1961-08-05 Provider: [name], MD  Identifying Statement:  Hannah [name] is a 60 y.o. female with left frontal glioblastoma   Oncologic History: Oncology History  GBM (glioblastoma multiforme) ([place])   Initial Diagnosis   GBM (glioblastoma multiforme) ([place] [facility])   02/17/2020 Surgery   Craniotomy, left frontal resection by Dr. Marcello Moores.  Path is glioblastoma.   08/28/2020 - 10/09/2020 Radiation Therapy   Radiation and concurrent Temozolomide 73m/m2 daily   11/10/2020 - 12/09/2020 Chemotherapy   Completes 1 cycle of 5-day Temozolomide      [facility]   Admitted for saddle PE, undergoes thrombectomy and IVC filter placement   03/09/2021 Progression      03/27/2021 -  Chemotherapy   Initiates second line therapy with Avastin 165mkg IV q2 weeks      Biomarkers:  MGMT Methylated.  IDH 1/2 Wild type.  EGFR Unknown  TERT Unknown   Interval History:  [name] presents today for avastin infusion. No recurrence in urinary symptoms.  Did have an episode of involuntary right leg shaking, which stopped with touching the leg.  No new or progressive neurologic deficits.  Also describes ongoing worsening of fatigue during the day, poor energy with ADLs.  Functional status is still limited to independent feeding, requires help with all other ADLs.  Continues on Vimpat to 10061m00mg without breakthrough seizures.    Decadron 11/09/20: 2mg34m/10/22: 8mg 78m21/22: 6mg 021m7/22: 4mg 0388m/22: 8mg 04/23m22: 6mg 05/183m2: 3mg 05/2325m: 2mg 06/06/51m 1.5mg 06/20/289m-  H+P (01/20/20) Patient presented one month ago with sudden onset left arm and leg weakness and interrupted speech, [name],  [name], stroke eval and CNS imaging which demonstrated a non-enhancing left frontal mass.  At this time she is back to baseline without recurrence of events, taking Keppra 500mg twice p25may.  No history or seizure or any other neurologic events.  She does describe being sleep deprived prior to seizure event.  She presents today after one month follow up MRI scan.  Medications: Current Outpatient Medications on File Prior to Visit  Medication Sig Dispense Refill   apixaban (ELIQUIS) 5 MG TABS tablet TAKE 1 TABLET (5 MG TOTAL) BY MOUTH 2 (TWO) TIMES DAILY. 60 tablet 3   cholecalciferol (VITAMIN D3) 25 MCG (1000 UNIT) tablet Take 1,000 Units by mouth daily.     ciprofloxacin (CIPRO) 500 MG tablet Take 1 tablet (500 mg total) by mouth 2 (two) times daily. 28 tablet 0   docusate sodium (COLACE) 100 MG capsule Take 100 mg by mouth 2 (two) times daily as needed for mild constipation.     Lacosamide 100 MG TABS Take 1 tablet by mouth in the AM (100mg) and 2 t73mts by mouth at bedtime (200mg) 90 table67m  ondansetron (ZOFRAN) 8 MG tablet TAKE 1 TABLET BY MOUTH TWICE DAILY AS NEEDED FOR NAUSEA AND/OR VOMITING. MAY TAKE 30 TO 60 MINUTES PRIOR TO TEMODAR ADMINISTRATION IF NEEDED (Patient not taking: No sig reported) 30 tablet 0   temozolomide (TEMODAR) 100 MG capsule TAKE 1 CAPSULE (100 MG TOTAL) BY MOUTH DAILY. MAY TAKE ON AN EMPTY STOMACH TO DECREASE NAUSEA &  VOMITING. (Patient not taking: No sig reported) 42 capsule 0   temozolomide (TEMODAR) 20 MG capsule TAKE 1 CAPSULE (20 MG TOTAL) BY MOUTH DAILY. MAY TAKE ON AN EMPTY STOMACH TO DECREASE NAUSEA & VOMITING. (Patient not taking: No sig reported) 42 capsule 0   [DISCONTINUED] lamoTRIgine (LAMICTAL) 100 MG tablet Take 1 tablet (100 mg total) by mouth daily. 30 tablet 3   No current facility-administered medications on file prior to visit.    Allergies: No Known  Allergies Past Medical History:  Past Medical History:  Diagnosis Date   Cervical spondylolysis 12/15/2019   Skeleton: Mild cervical spondylosis C6-7. Incidental find.    Colon polyp    GBM (glioblastoma multiforme) (H[facility])    Left ankle sprain 10/07/2011   Seizure ([place])    partial    Tick bite 02/2020   Past Surgical History:  Past Surgical History:  Procedure Laterality Date   APPLICATION OF CRANIAL NAVIGATION Left 02/16/2020   Procedure: APPLICATION OF CRANIAL NAVIGATION;  Surgeon: Vallarie Mare, MD;  Location: [facility];  Service: Neurosurgery;  Laterality: Left;   CRANIOTOMY Left 02/16/2020   Procedure: LEFT FRONTAL CRANIOTOMY FOR BRAIN TUMOR;  Surgeon: Vallarie Mare, MD;  Location: [facility];  Service: Neurosurgery;  Laterality: Left;   FOOT SURGERY Left    bone spur - local anesthesia   IR ANGIOGRAM PULMONARY BILATERAL SELECTIVE  12/15/2020   IR ANGIOGRAM SELECTIVE EACH ADDITIONAL VESSEL  12/15/2020   IR ANGIOGRAM SELECTIVE EACH ADDITIONAL VESSEL  12/15/2020   IR IVC FILTER PLMT / S&I /IMG GUID/MOD SED  12/15/2020   IR THROMBECT PRIM MECH INIT (INCLU) MOD SED  12/15/2020   IR THROMBECT PRIM MECH INIT (INCLU) MOD SED  12/15/2020   RADIOLOGY WITH ANESTHESIA N/A 12/15/2020   Procedure: IR WITH ANESTHESIA;  Surgeon: Radiologist, Medication, MD;  Location: [facility];  Service: Radiology;  Laterality: N/A;   TONSILECTOMY/ADENOIDECTOMY WITH MYRINGOTOMY     tonsils and adenoids out only   WISDOM TOOTH EXTRACTION     Social History:  Social History   Socioeconomic History   Marital status: Married    Spouse name: Not on file   Number of children: 3   Years of education: Not on file   Highest education level: Not on file  Occupational History   Not on file  Tobacco Use   Smoking status: Former    Years: 2.00    Types: Cigarettes    Quit date: 11/05/1979    Years since quitting: 41.7   Smokeless tobacco: Never   Tobacco comments:    1/2 pack a week  Vaping Use   Vaping Use: Never  used  Substance and Sexual Activity   Alcohol use: Yes    Alcohol/week: 0.0 standard drinks    Comment: 1 glass of wine weekly   Drug use: No   Sexual activity: Yes    Birth control/protection: Surgical  Other Topics Concern   Not on file  Social History Narrative   Not on file   Social Determinants of Health   Financial Resource Strain: Not on file  Food Insecurity: Not on file  Transportation Needs: Not on file  Physical Activity: Not on file  Stress: Not on file  Social Connections: Not on file  Intimate Partner Violence: Not on file   Family History:  Family History  Problem Relation Age of Onset   Hypertension Mother    Stroke Mother    Diabetes Mother    Colon cancer Father 70  Hypertension Sister    Colon polyps Sister    Heart disease Brother 72   Diabetes Maternal Uncle    Diabetes Maternal Uncle     Review of Systems: Constitutional: Doesn't report fevers, chills or abnormal weight loss Eyes: Doesn't report blurriness of vision Ears, nose, mouth, throat, and face: Doesn't report sore throat Respiratory: Chest wall pain Cardiovascular: Doesn't report palpitation, chest discomfort  Gastrointestinal:  Doesn't report nausea, constipation, diarrhea GU: Doesn't report incontinence Skin: Doesn't report skin rashes Neurological: Per HPI Musculoskeletal: R shoulder pain, limited ROM Behavioral/Psych: Doesn't report anxiety  Physical Exam: Vitals:   07/16/21 0938  BP: (!) 148/96  Pulse: 83  Resp: 17  Temp: 98.2 F (36.8 C)  SpO2: 99%    KPS: 60. General: Alert, cooperative, pleasant, in no acute distress Head: Normal EENT: No conjunctival injection or scleral icterus.  Lungs: Resp effort normal Cardiac: Regular rate Abdomen: Non-distended abdomen Skin: No rashes cyanosis or petechiae. Extremities: No clubbing or edema  Neurologic Exam: Mental Status: Awake, alert, attentive to examiner. Oriented to self and environment. Language is fluent with  intact comprehension.  Expressive dysphasia.  Cranial Nerves: Visual acuity is grossly normal. Visual fields are full. Extra-ocular movements intact. No ptosis. Face is symmetric Motor: Tone and bulk are normal. Right leg 3/5, left leg 4/5, proximal greater than distal weakness. Reflexes are symmetric, no pathologic reflexes present.  Sensory: Intact to light touch Gait: Deferred today  Labs: I have reviewed the data as listed    Component Value Date/Time   NA 140 07/02/2021 1159   K 3.9 07/02/2021 1159   CL 104 07/02/2021 1159   CO2 26 07/02/2021 1159   GLUCOSE 93 07/02/2021 1159   GLUCOSE 85 10/13/2006 1158   BUN 7 07/02/2021 1159   CREATININE 0.72 07/02/2021 1159   CALCIUM 9.5 07/02/2021 1159   PROT 6.5 07/02/2021 1159   ALBUMIN 3.9 07/02/2021 1159   AST 13 (L) 07/02/2021 1159   ALT 6 07/02/2021 1159   ALKPHOS 41 07/02/2021 1159   BILITOT 0.4 07/02/2021 1159   GFRNONAA >60 07/02/2021 1159   GFRAA >60 02/17/2020 0136   Lab Results  Component Value Date   WBC 3.8 (L) 07/16/2021   NEUTROABS 2.7 07/16/2021   HGB 15.2 (H) 07/16/2021   HCT 43.0 07/16/2021   MCV 94.1 07/16/2021   PLT 160 07/16/2021     Assessment/Plan GBM (glioblastoma multiforme) (HCC)  Seizure disorder (HCC)  Atonya A Zywicki is clinically stable today.  We recommended continuing treatment with Avastin 61m/kg IV q2 weeks.  Avastin will help treat inflammatory processes in the brain, and act as steroid sparing agent; of greater need given myopathic features noted today.  It may also help control progression of organic disease, if present.    Avastin should be held for the following:  ANC less than 500  Platelets less than 50,000  LFT or creatinine greater than 2x ULN  If clinical concerns/contraindications develop   We discussed trial of ritalin 545mBID for ongoing fatigue issues.  Will continue to defer chemotherapy in the very near term due to patient preference and poor functional status.     We ask that Kiahna A Tippin return to clinic in 2 weeks prior to next cycle of avastin.  Next MRI will be scheduled for 07/27/21.  All questions were answered. The patient knows to call the clinic with any problems, questions or concerns. No barriers to learning were detected.  I have spent a total of 30  minutes of face-to-face and non-face-to-face time, excluding clinical staff time, preparing to see patient, ordering tests and/or medications, counseling the patient, and independently interpreting results and communicating results to the patient/family/caregiver    [name], MD Medical Director of Neuro-Oncology [facility] at [place] 07/16/21 9:37 AM

## 2021-07-16 NOTE — Patient Instructions (Signed)
[facility] MEDICAL [facility]COLOGY  Discharge Instructions: Thank you for choosing [facility] to provide your oncology and hematology care.   If you have a lab appointment with the Cancer Center, please go directly to the Cancer Center and check in at the registration area.   Wear comfortable clothing and clothing appropriate for easy access to any Portacath or PICC line.   We strive to give you quality time with your provider. You may need to reschedule your appointment if you arrive late (15 or more minutes).  Arriving late affects you and other patients whose appointments are after yours.  Also, if you miss three or more appointments without notifying the office, you may be dismissed from the clinic at the provider's discretion.      For prescription refill requests, have your pharmacy contact our office and allow 72 hours for refills to be completed.    Today you received the following chemotherapy and/or immunotherapy agents Avastin       To help prevent nausea and vomiting after your treatment, we encourage you to take your nausea medication as directed.  BELOW ARE SYMPTOMS THAT SHOULD BE REPORTED IMMEDIATELY: *FEVER GREATER THAN 100.4 F (38 C) OR HIGHER *CHILLS OR SWEATING *NAUSEA AND VOMITING THAT IS NOT CONTROLLED WITH YOUR NAUSEA MEDICATION *UNUSUAL SHORTNESS OF BREATH *UNUSUAL BRUISING OR BLEEDING *URINARY PROBLEMS (pain or burning when urinating, or frequent urination) *BOWEL PROBLEMS (unusual diarrhea, constipation, pain near the anus) TENDERNESS IN MOUTH AND THROAT WITH OR WITHOUT PRESENCE OF ULCERS (sore throat, sores in mouth, or a toothache) UNUSUAL RASH, SWELLING OR PAIN  UNUSUAL VAGINAL DISCHARGE OR ITCHING   Items with * indicate a potential emergency and should be followed up as soon as possible or go to the Emergency Department if any problems should occur.  Please show the CHEMOTHERAPY ALERT CARD or IMMUNOTHERAPY ALERT CARD at check-in to  the Emergency Department and triage nurse.  Should you have questions after your visit or need to cancel or reschedule your appointment, please contact [facility]  Dept: [phone]  and follow the prompts.  Office hours are 8:00 a.m. to 4:30 p.m. Monday - Friday. Please note that voicemails left after 4:00 p.m. may not be returned until the following business day.  We are closed weekends and major holidays. You have access to a nurse at all times for urgent questions. Please call the main number to the clinic Dept: [phone] and follow the prompts.   For any non-urgent questions, you may also contact your provider using MyChart. We now offer e-Visits for anyone 18 and older to request care online for non-urgent symptoms. For details visit [email or web address].   Also download the MyChart app! Go to the app store, search "MyChart", open the app, select , and log in with your MyChart username and password.  Due to Covid, a mask is required upon entering the hospital/clinic. If you do not have a mask, one will be given to you upon arrival. For doctor visits, patients may have 1 support person aged 18 or older with them. For treatment visits, patients cannot have anyone with them due to current Covid guidelines and our immunocompromised population.   

## 2021-07-17 ENCOUNTER — Other Ambulatory Visit: Payer: Self-pay | Admitting: Radiation Therapy

## 2021-07-17 ENCOUNTER — Other Ambulatory Visit (HOSPITAL_BASED_OUTPATIENT_CLINIC_OR_DEPARTMENT_OTHER): Payer: Self-pay

## 2021-07-25 ENCOUNTER — Other Ambulatory Visit (HOSPITAL_BASED_OUTPATIENT_CLINIC_OR_DEPARTMENT_OTHER): Payer: Self-pay

## 2021-07-26 ENCOUNTER — Telehealth: Payer: Self-pay | Admitting: Internal Medicine

## 2021-07-26 ENCOUNTER — Other Ambulatory Visit (HOSPITAL_BASED_OUTPATIENT_CLINIC_OR_DEPARTMENT_OTHER): Payer: Self-pay

## 2021-07-26 NOTE — Telephone Encounter (Signed)
Sch per 9/22 los, left msg with husband

## 2021-07-27 ENCOUNTER — Ambulatory Visit (HOSPITAL_COMMUNITY)
Admission: RE | Admit: 2021-07-27 | Discharge: 2021-07-27 | Disposition: A | Discharge status: Home / Self Care | Payer: 59 | Source: Ambulatory Visit | Attending: Internal Medicine | Admitting: Internal Medicine

## 2021-07-27 ENCOUNTER — Other Ambulatory Visit: Payer: Self-pay

## 2021-07-27 DIAGNOSIS — C719 Malignant neoplasm of brain, unspecified: Secondary | ICD-10-CM | POA: Insufficient documentation

## 2021-07-27 DIAGNOSIS — R220 Localized swelling, mass and lump, head: Secondary | ICD-10-CM | POA: Diagnosis not present

## 2021-07-27 MED ORDER — GADOBUTROL 1 MMOL/ML IV SOLN
6.0000 mL | Freq: Once | INTRAVENOUS | Status: AC | PRN
  Administered 2021-07-27: 6 mL via INTRAVENOUS

## 2021-07-30 ENCOUNTER — Inpatient Hospital Stay: Payer: 59

## 2021-07-30 ENCOUNTER — Inpatient Hospital Stay (HOSPITAL_BASED_OUTPATIENT_CLINIC_OR_DEPARTMENT_OTHER): Payer: 59 | Admitting: Internal Medicine

## 2021-07-30 ENCOUNTER — Other Ambulatory Visit: Payer: Self-pay

## 2021-07-30 VITALS — BP 139/89 | HR 78 | Temp 97.8°F | Resp 18 | Ht 65.0 in | Wt 131.0 lb

## 2021-07-30 DIAGNOSIS — C719 Malignant neoplasm of brain, unspecified: Secondary | ICD-10-CM | POA: Diagnosis not present

## 2021-07-30 DIAGNOSIS — Z7901 Long term (current) use of anticoagulants: Secondary | ICD-10-CM | POA: Diagnosis not present

## 2021-07-30 DIAGNOSIS — Z5112 Encounter for antineoplastic immunotherapy: Secondary | ICD-10-CM | POA: Diagnosis not present

## 2021-07-30 DIAGNOSIS — G40909 Epilepsy, unspecified, not intractable, without status epilepticus: Secondary | ICD-10-CM | POA: Diagnosis not present

## 2021-07-30 DIAGNOSIS — C711 Malignant neoplasm of frontal lobe: Secondary | ICD-10-CM | POA: Diagnosis not present

## 2021-07-30 DIAGNOSIS — Z79899 Other long term (current) drug therapy: Secondary | ICD-10-CM | POA: Diagnosis not present

## 2021-07-30 LAB — TOTAL PROTEIN, URINE DIPSTICK: Protein, ur: NEGATIVE mg/dL

## 2021-07-30 LAB — CBC WITH DIFFERENTIAL (CANCER CENTER ONLY)
Abs Immature Granulocytes: 0 10*3/uL (ref 0.00–0.07)
Basophils Absolute: 0 10*3/uL (ref 0.0–0.1)
Basophils Relative: 0 %
Eosinophils Absolute: 0.1 10*3/uL (ref 0.0–0.5)
Eosinophils Relative: 2 %
HCT: 43.9 % (ref 36.0–46.0)
Hemoglobin: 15.3 g/dL — ABNORMAL HIGH (ref 12.0–15.0)
Immature Granulocytes: 0 %
Lymphocytes Relative: 17 %
Lymphs Abs: 0.8 10*3/uL (ref 0.7–4.0)
MCH: 32.3 pg (ref 26.0–34.0)
MCHC: 34.9 g/dL (ref 30.0–36.0)
MCV: 92.8 fL (ref 80.0–100.0)
Monocytes Absolute: 0.6 10*3/uL (ref 0.1–1.0)
Monocytes Relative: 14 %
Neutro Abs: 3 10*3/uL (ref 1.7–7.7)
Neutrophils Relative %: 67 %
Platelet Count: 201 10*3/uL (ref 150–400)
RBC: 4.73 MIL/uL (ref 3.87–5.11)
RDW: 12 % (ref 11.5–15.5)
WBC Count: 4.5 10*3/uL (ref 4.0–10.5)
nRBC: 0 % (ref 0.0–0.2)

## 2021-07-30 LAB — CMP (CANCER CENTER ONLY)
ALT: 7 U/L (ref 0–44)
AST: 14 U/L — ABNORMAL LOW (ref 15–41)
Albumin: 4 g/dL (ref 3.5–5.0)
Alkaline Phosphatase: 44 U/L (ref 38–126)
Anion gap: 10 (ref 5–15)
BUN: 12 mg/dL (ref 6–20)
CO2: 24 mmol/L (ref 22–32)
Calcium: 9.8 mg/dL (ref 8.9–10.3)
Chloride: 107 mmol/L (ref 98–111)
Creatinine: 0.71 mg/dL (ref 0.44–1.00)
GFR, Estimated: >60 mL/min (ref >60)
Glucose, Bld: 92 mg/dL (ref 70–99)
Potassium: 3.8 mmol/L (ref 3.5–5.1)
Sodium: 141 mmol/L (ref 135–145)
Total Bilirubin: 0.7 mg/dL (ref 0.3–1.2)
Total Protein: 6.7 g/dL (ref 6.5–8.1)

## 2021-07-30 MED ORDER — SODIUM CHLORIDE 0.9 % IV SOLN: Freq: Once | INTRAVENOUS | Status: AC

## 2021-07-30 MED ORDER — SODIUM CHLORIDE 0.9 % IV SOLN
600.0000 mg | Freq: Once | INTRAVENOUS | Status: AC
  Administered 2021-07-30: 600 mg via INTRAVENOUS
  Filled 2021-07-30: qty 24

## 2021-07-30 NOTE — Patient Instructions (Signed)
[facility]  Discharge Instructions: Thank you for choosing [facility] to provide your oncology and hematology care.   If you have a lab appointment with the [facility], please go directly to the [facility] and check in at the registration area.   Wear comfortable clothing and clothing appropriate for easy access to any Portacath or PICC line.   We strive to give you quality time with your provider. You may need to reschedule your appointment if you arrive late (15 or more minutes).  Arriving late affects you and other patients whose appointments are after yours.  Also, if you miss three or more appointments without notifying the office, you may be dismissed from the clinic at the provider's discretion.      For prescription refill requests, have your pharmacy contact our office and allow 72 hours for refills to be completed.    Today you received the following chemotherapy and/or immunotherapy agents: MVASI    To help prevent nausea and vomiting after your treatment, we encourage you to take your nausea medication as directed.  BELOW ARE SYMPTOMS THAT SHOULD BE REPORTED IMMEDIATELY: *FEVER GREATER THAN 100.4 F (38 C) OR HIGHER *CHILLS OR SWEATING *NAUSEA AND VOMITING THAT IS NOT CONTROLLED WITH YOUR NAUSEA MEDICATION *UNUSUAL SHORTNESS OF BREATH *UNUSUAL BRUISING OR BLEEDING *URINARY PROBLEMS (pain or burning when urinating, or frequent urination) *BOWEL PROBLEMS (unusual diarrhea, constipation, pain near the anus) TENDERNESS IN MOUTH AND THROAT WITH OR WITHOUT PRESENCE OF ULCERS (sore throat, sores in mouth, or a toothache) UNUSUAL RASH, SWELLING OR PAIN  UNUSUAL VAGINAL DISCHARGE OR ITCHING   Items with * indicate a potential emergency and should be followed up as soon as possible or go to the Emergency Department if any problems should occur.  Please show the CHEMOTHERAPY ALERT CARD or IMMUNOTHERAPY ALERT CARD at check-in to the  Emergency Department and triage nurse.  Should you have questions after your visit or need to cancel or reschedule your appointment, please contact [facility]  Dept: [phone]  and follow the prompts.  Office hours are 8:00 a.m. to 4:30 p.m. Monday - Friday. Please note that voicemails left after 4:00 p.m. may not be returned until the following business day.  We are closed weekends and major holidays. You have access to a nurse at all times for urgent questions. Please call the main number to the clinic Dept: [phone] and follow the prompts.   For any non-urgent questions, you may also contact your provider using MyChart. We now offer e-Visits for anyone 60 and older to request care online for non-urgent symptoms. For details visit [email or web address].   Also download the MyChart app! Go to the app store, search "MyChart", open the app, select [facility], and log in with your MyChart username and password.  Due to Covid, a mask is required upon entering the hospital/clinic. If you do not have a mask, one will be given to you upon arrival. For doctor visits, patients may have 1 support person aged 7 or older with them. For treatment visits, patients cannot have anyone with them due to current Covid guidelines and our immunocompromised population.

## 2021-07-30 NOTE — Progress Notes (Signed)
[facility] [address], [place] [address] [phone]   Interval Evaluation  Date of Service: 07/30/21 Patient Name: Hannah [name] Patient MRN: [id number] Patient DOB: 04-09-1961 Provider: [name], MD  Identifying Statement:  Hannah [name] is a 60 y.o. female with left frontal glioblastoma   Oncologic History: Oncology History  GBM (glioblastoma multiforme) ([place])   Initial Diagnosis   GBM (glioblastoma multiforme) ([facility])   02/17/2020 Surgery   Craniotomy, left frontal resection by Dr. Marcello Moores.  Path is glioblastoma.   08/28/2020 - 10/09/2020 Radiation Therapy   Radiation and concurrent Temozolomide 32m/m2 daily   11/10/2020 - 12/09/2020 Chemotherapy   Completes 1 cycle of 5-day Temozolomide      [facility]   Ad[facility] for saddle [facility], undergoes thrombectomy and IVC filter placement   03/09/2021 Progression      03/27/2021 -  Chemotherapy   Initiates second line therapy with Avastin 141mkg IV q2 weeks      Biomarkers:  MGMT Methylated.  IDH 1/2 Wild type.  EGFR Unknown  TERT Unknown   Interval History:  [name] presents today for avastin infusion following recent MRI brain.  She did experience two breakthrough seizures this past week.  Cognition and fatigue have continued to worsen.  She is participating minimally in coversations, activities of daily living.  She is fully dependent on husband and family all ALDs.  Continues on Vimpat to 10023m00mg.    Decadron 11/09/20: 2mg73m/10/22: 8mg 41m21/22: 6mg 072m7/22: 4mg 0342m/22: 8mg 04/32m22: 6mg 05/131m2: 3mg 05/232m: 2mg 06/06/45m 1.5mg 06/20/256m-  H+P (01/20/20) Patient presented one month ago with sudden onset left arm and leg weakness and interrupted speech, [name], [name], stroke eval and CNS  imaging which demonstrated a non-enhancing left frontal mass.  At this time she is back to baseline without recurrence of events, taking Keppra 500mg twice p20may.  No history or seizure or any other neurologic events.  She does describe being sleep deprived prior to seizure event.  She presents today after one month follow up MRI scan.  Medications: Current Outpatient Medications on File Prior to Visit  Medication Sig Dispense Refill   apixaban (ELIQUIS) 5 MG TABS tablet TAKE 1 TABLET (5 MG TOTAL) BY MOUTH 2 (TWO) TIMES DAILY. 60 tablet 3   cholecalciferol (VITAMIN D3) 25 MCG (1000 UNIT) tablet Take 1,000 Units by mouth daily.     ciprofloxacin (CIPRO) 500 MG tablet Take 1 tablet (500 mg total) by mouth 2 (two) times daily. (Patient not taking: Reported on 07/16/2021) 28 tablet 0   docusate sodium (COLACE) 100 MG capsule Take 100 mg by mouth 2 (two) times daily as needed for mild constipation.     Lacosamide 100 MG TABS Take 1 tablet by mouth in the AM (100mg) and 2 t37mts by mouth at bedtime (200mg) 90 table54m  methylphenidate (RITALIN) 5 MG tablet Take 1 tablet (5 mg total) by mouth 2 (two) times daily. 60 tablet 0   ondansetron (ZOFRAN) 8 MG tablet TAKE 1 TABLET BY MOUTH TWICE DAILY AS NEEDED FOR NAUSEA AND/OR VOMITING. MAY TAKE 30 TO 60 MINUTES PRIOR TO TEMODAR ADMINISTRATION IF NEEDED (Patient not taking: No sig reported) 30 tablet 0   temozolomide (TEMODAR) 100 MG capsule TAKE 1 CAPSULE (100 MG TOTAL)  BY MOUTH DAILY. MAY TAKE ON AN EMPTY STOMACH TO DECREASE NAUSEA & VOMITING. (Patient not taking: No sig reported) 42 capsule 0   temozolomide (TEMODAR) 20 MG capsule TAKE 1 CAPSULE (20 MG TOTAL) BY MOUTH DAILY. MAY TAKE ON AN EMPTY STOMACH TO DECREASE NAUSEA & VOMITING. (Patient not taking: No sig reported) 42 capsule 0   [DISCONTINUED] lamoTRIgine (LAMICTAL) 100 MG tablet Take 1 tablet (100 mg total) by mouth daily. 30 tablet 3   No current facility-administered medications on file prior to  visit.    Allergies: No Known Allergies Past Medical History:  Past Medical History:  Diagnosis Date   Cervical spondylolysis 12/15/2019   Skeleton: Mild cervical spondylosis C6-7. Incidental find.    Colon polyp    GBM (glioblastoma multiforme) (H[facility])    Left ankle sprain 10/07/2011   Seizure ([place])    partial    Tick bite 02/2020   Past Surgical History:  Past Surgical History:  Procedure Laterality Date   APPLICATION OF CRANIAL NAVIGATION Left 02/16/2020   Procedure: APPLICATION OF CRANIAL NAVIGATION;  Surgeon: Vallarie Mare, MD;  Location: [facility];  Service: Neurosurgery;  Laterality: Left;   CRANIOTOMY Left 02/16/2020   Procedure: LEFT FRONTAL CRANIOTOMY FOR BRAIN TUMOR;  Surgeon: Vallarie Mare, MD;  Location: [facility];  Service: Neurosurgery;  Laterality: Left;   FOOT SURGERY Left    bone spur - local anesthesia   IR ANGIOGRAM PULMONARY BILATERAL SELECTIVE  12/15/2020   IR ANGIOGRAM SELECTIVE EACH ADDITIONAL VESSEL  12/15/2020   IR ANGIOGRAM SELECTIVE EACH ADDITIONAL VESSEL  12/15/2020   IR IVC FILTER PLMT / S&I /IMG GUID/MOD SED  12/15/2020   IR THROMBECT PRIM MECH INIT (INCLU) MOD SED  12/15/2020   IR THROMBECT PRIM MECH INIT (INCLU) MOD SED  12/15/2020   RADIOLOGY WITH ANESTHESIA N/A 12/15/2020   Procedure: IR WITH ANESTHESIA;  Surgeon: Radiologist, Medication, MD;  Location: [facility];  Service: Radiology;  Laterality: N/A;   TONSILECTOMY/ADENOIDECTOMY WITH MYRINGOTOMY     tonsils and adenoids out only   WISDOM TOOTH EXTRACTION     Social History:  Social History   Socioeconomic History   Marital status: Married    Spouse name: Not on file   Number of children: 3   Years of education: Not on file   Highest education level: Not on file  Occupational History   Not on file  Tobacco Use   Smoking status: Former    Years: 2.00    Types: Cigarettes    Quit date: 11/05/1979    Years since quitting: 41.7   Smokeless tobacco: Never   Tobacco comments:    1/2 pack a week   Vaping Use   Vaping Use: Never used  Substance and Sexual Activity   Alcohol use: Yes    Alcohol/week: 0.0 standard drinks    Comment: 1 glass of wine weekly   Drug use: No   Sexual activity: Yes    Birth control/protection: Surgical  Other Topics Concern   Not on file  Social History Narrative   Not on file   Social Determinants of Health   Financial Resource Strain: Not on file  Food Insecurity: Not on file  Transportation Needs: Not on file  Physical Activity: Not on file  Stress: Not on file  Social Connections: Not on file  Intimate Partner Violence: Not on file   Family History:  Family History  Problem Relation Age of Onset   Hypertension Mother    Stroke Mother  Diabetes Mother    Colon cancer Father 61   Hypertension Sister    Colon polyps Sister    Heart disease Brother 51   Diabetes Maternal Uncle    Diabetes Maternal Uncle     Review of Systems: Constitutional: Doesn't report fevers, chills or abnormal weight loss Eyes: Doesn't report blurriness of vision Ears, nose, mouth, throat, and face: Doesn't report sore throat Respiratory: Chest wall pain Cardiovascular: Doesn't report palpitation, chest discomfort  Gastrointestinal:  Doesn't report nausea, constipation, diarrhea GU: Doesn't report incontinence Skin: Doesn't report skin rashes Neurological: Per HPI Musculoskeletal: R shoulder pain, limited ROM Behavioral/Psych: Doesn't report anxiety  Physical Exam: Vitals:   07/30/21 1226  BP: 139/89  Pulse: 78  Resp: 18  Temp: 97.8 F (36.6 C)  SpO2: 98%   KPS: 60. General: Alert, cooperative, pleasant, in no acute distress Head: Normal EENT: No conjunctival injection or scleral icterus.  Lungs: Resp effort normal Cardiac: Regular rate Abdomen: Non-distended abdomen Skin: No rashes cyanosis or petechiae. Extremities: No clubbing or edema  Neurologic Exam: Mental Status: Awake, alert, attentive to examiner. Both receptive and expressive  dysphasia.  Cranial Nerves: Visual acuity is grossly normal. Visual fields are full. Extra-ocular movements intact. No ptosis. Face is symmetric Motor: Tone and bulk are normal. Right leg 3/5, left leg 4/5, proximal greater than distal weakness. Reflexes are symmetric, no pathologic reflexes present.  Sensory: Intact to light touch Gait: Deferred today  Labs: I have reviewed the data as listed    Component Value Date/Time   NA 143 07/16/2021 0905   K 3.8 07/16/2021 0905   CL 109 07/16/2021 0905   CO2 23 07/16/2021 0905   GLUCOSE 100 (H) 07/16/2021 0905   GLUCOSE 85 10/13/2006 1158   BUN 10 07/16/2021 0905   CREATININE 0.72 07/16/2021 0905   CALCIUM 10.0 07/16/2021 0905   PROT 6.7 07/16/2021 0905   ALBUMIN 4.0 07/16/2021 0905   AST 14 (L) 07/16/2021 0905   ALT 9 07/16/2021 0905   ALKPHOS 43 07/16/2021 0905   BILITOT 0.4 07/16/2021 0905   GFRNONAA >60 07/16/2021 0905   GFRAA >60 02/17/2020 0136   Lab Results  Component Value Date   WBC 4.5 07/30/2021   NEUTROABS 3.0 07/30/2021   HGB 15.3 (H) 07/30/2021   HCT 43.9 07/30/2021   MCV 92.8 07/30/2021   PLT 201 07/30/2021   Imaging:  [facility] Clinician Interpretation: I have personally reviewed the CNS images as listed.  My interpretation, in the context of the patient's clinical presentation, is progressive disease  MR BRAIN W WO CONTRAST  Result Date: 07/28/2021 CLINICAL DATA:  Brain/CNS neoplasm, assess treatment response On chart review, patient initiated second-line therapy with Avastin on 03/27/2021. EXAM: MRI HEAD WITHOUT AND WITH CONTRAST TECHNIQUE: Multiplanar, multiecho pulse sequences of the brain and surrounding structures were obtained without and with intravenous contrast. CONTRAST:  80m GADAVIST GADOBUTROL 1 MMOL/ML IV SOLN COMPARISON:  Mar 09, 2021. FINDINGS: Brain: Heterogeneous mass spanning both cerebral hemispheres at the corpus callosum body with hemorrhagic and necrotic features. Posteriorly the mass measures  similar in size (approximately 4.1 by 2.5 cm); however, there is new faintly enhancing component anteriorly measuring approximately 2.4 x 1.6 cm which demonstrates restricted diffusion (see series 16, image 97; series 18, image 13; series 5, image 84; series 11, image 30). Similar generalized T2 hyperintensity in the bilateral hemispheric white matter. No midline shift. Basal cisterns are patent. No hydrocephalus. No acute hemorrhage. Vascular: Major arterial flow voids are maintained at the  skull base. Skull and upper cervical spine: Left frontal craniotomy. Otherwise, normal marrow signal. Sinuses/Orbits: Negative. Other: No mastoid effusions. IMPRESSION: Findings concerning for tumor progression with new tumor anteriorly with associated restricted diffusion, detailed above. This area only faintly enhances, likely related to Avastin treatment. Electronically Signed   By: Margaretha Sheffield M.D.   On: 07/28/2021 07:49    Assessment/Plan GBM (glioblastoma multiforme) (HCC)  Seizure disorder ([place])  Ellieana A Angelino is clinically progressive today, with ongoing decline in cognitive capacity, language, and motor function.  MRI brain demonstrates clear progression of disease within anterior aspect of corpus callosum, most notable on sagittal post-contrast and DWI sequences.  We had prolonged goals of care discussion regarding path forward.  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  We discussed trial of daily metronomic Temodar to prevent further tumor growth vs referral to home hospice services.    In meantime, will continue treatment with Avastin 66m/kg IV q2 weeks.  Avastin will help treat inflammatory processes in the brain, and act as steroid sparing agent.  Avastin should be held for the following:  ANC less than 500  Platelets less than 50,000  LFT or creatinine greater than 2x ULN  If clinical concerns/contraindications  develop   She will discuss plans for gentle chemo vs hospice with her family and get back to [place] preferred path forward sometime this week.  We ask that Ronette A Rezek return pending input from goals of care discussion, per above.   All questions were answered. The patient knows to call the clinic with any problems, questions or concerns. No barriers to learning were detected.  I have spent a total of 40 minutes of face-to-face and non-face-to-face time, excluding clinical staff time, preparing to see patient, ordering tests and/or medications, counseling the patient, and independently interpreting results and communicating results to the patient/family/caregiver    ZVentura Sellers MD Medical Director of Neuro-Oncology [facility] [place]09/26/22 12:13 PM

## 2021-08-02 DIAGNOSIS — C719 Malignant neoplasm of brain, unspecified: Secondary | ICD-10-CM | POA: Diagnosis not present

## 2021-08-06 ENCOUNTER — Telehealth: Payer: Self-pay | Admitting: Pharmacist

## 2021-08-06 ENCOUNTER — Other Ambulatory Visit (HOSPITAL_COMMUNITY): Payer: Self-pay

## 2021-08-06 ENCOUNTER — Other Ambulatory Visit: Payer: Self-pay | Admitting: Internal Medicine

## 2021-08-06 DIAGNOSIS — C719 Malignant neoplasm of brain, unspecified: Secondary | ICD-10-CM

## 2021-08-06 MED ORDER — TEMOZOLOMIDE 20 MG PO CAPS
80.0000 mg | ORAL_CAPSULE | Freq: Every day | ORAL | 0 refills | Status: DC
  Filled 2021-08-06: qty 126, 31d supply, fill #0
  Filled 2021-08-07: qty 120, 30d supply, fill #0

## 2021-08-06 MED ORDER — ONDANSETRON HCL 8 MG PO TABS
8.0000 mg | ORAL_TABLET | Freq: Two times a day (BID) | ORAL | 1 refills | Status: DC | PRN
  Filled 2021-08-06 – 2021-08-07 (×2): qty 30, 15d supply, fill #0

## 2021-08-06 NOTE — Progress Notes (Signed)
DISCONTINUE ON PATHWAY REGIMEN - Neuro     One cycle, concurrent with RT:     Temozolomide   **Always confirm dose/schedule in your pharmacy ordering system**  REASON: Disease Progression PRIOR TREATMENT: [id number]: Radiation Therapy with Concurrent Temozolomide 75 mg/m2 Daily x 6 Weeks, Followed by Sequential Temozolomide TREATMENT RESPONSE: Progressive Disease (PD)  Neuro - No Medical Intervention - Off Treatment.  Patient Characteristics: Disease Classification: Glioma

## 2021-08-06 NOTE — Telephone Encounter (Signed)
Oral Oncology Pharmacist Encounter  Received new prescription for Temodar (temozolomide) for the treatment of glioblastoma multiforme in conjunction with bevacizumab, planned duration until disease progression or unacceptable drug toxicity.  Prescription dose and frequency assessed for appropriateness. Patient will be on daily metronomic dosing at this time, patient unable to tolerate higher dose than 50 mg/m2 per MD at this time.    CBC w/ Diff and CMP from 07/30/21 assessed, no relevant lab abnormalities noted.   Current medication list in Epic reviewed, no relevant/significant DDIs with Temodar identified.  Evaluated chart and no patient barriers to medication adherence noted.   Patient agreement for treatment documented in MD note on 07/30/21.  Prescription has been e-scribed to the [facility] for benefits analysis and approval. Prior authorization for Temodar still on file for patient, patient has $0 copay at this time.  Oral Oncology Clinic will continue to follow for counseling and start date.  Leron Croak, PharmD, BCPS Hematology/Oncology Clinical Pharmacist [facility] [phone] 08/06/2021 10:39 AM

## 2021-08-07 ENCOUNTER — Other Ambulatory Visit (HOSPITAL_COMMUNITY): Payer: Self-pay

## 2021-08-07 NOTE — Telephone Encounter (Signed)
Oral Chemotherapy Pharmacist Encounter  I spoke with patient's husband for review of: Temodar (temozolomide) for the treatment of glioblastoma multiforme in conjunction with bevacizumab, planned duration until disease progression or unacceptable drug toxicity.  Reviewed administration, dosing, side effects, monitoring, drug-food interactions, safe handling, storage, and disposal.  Patient will take Temodar 20mg  capsules, 4 capsules (80mg  total daily dose) by mouth once daily, may take at bedtime and on an empty stomach to decrease nausea and vomiting.  Patient's husband expressed concerns about patient taking 4 capsules daily and having baseline difficulty with swallowing. MD notified.    Patient will be on daily metronomic dosing, and will be taking temozolomide continuously without breaks.   Temodar start date: 08/08/21 PM   Patient has at home antiemetic with Zofran 8mg  tablet. If N/V develops, patient can take 1 tablet by mouth 30-60 min prior to Temodar dose to help decrease N/V    Adverse effects include but are not limited to: nausea, vomiting, anorexia, GI upset, rash, drug fever, and fatigue. Rare but serious adverse effects of pneumocystis pneumonia and secondary malignancy also discussed.  We discussed strategies to manage constipation if they occur secondary to ondansetron dosing.  PCP prophylaxis will not be initiated at this time, but may be added based on lymphocyte count in the future.  Reviewed importance of keeping a medication schedule and plan for any missed doses. No barriers to medication adherence identified.  Medication reconciliation performed and medication/allergy list updated.  Insurance authorization for Temodar has been obtained. Test claim at the pharmacy revealed copayment $0 for 1st fill of Temodar. Patient's husband will pick this up from the [facility] outpatient pharmacy on 08/07/21.  Informed patient's husband that the pharmacy will reach out 5-7 days  prior to needing next fill of Temodar to coordinate continued medication acquisition to prevent break in therapy.  All questions answered.  Mr. Buckalew voiced understanding and appreciation.   Medication education handout placed in mail for patient's family. Patient's husband knows to call the office with questions or concerns. Oral Chemotherapy Clinic phone number provided.  Leron Croak, PharmD, BCPS Hematology/Oncology Clinical Pharmacist [facility] [phone] 08/07/2021 1:09 PM

## 2021-08-13 ENCOUNTER — Inpatient Hospital Stay (HOSPITAL_BASED_OUTPATIENT_CLINIC_OR_DEPARTMENT_OTHER): Payer: 59 | Admitting: Internal Medicine

## 2021-08-13 ENCOUNTER — Other Ambulatory Visit (HOSPITAL_BASED_OUTPATIENT_CLINIC_OR_DEPARTMENT_OTHER): Payer: Self-pay

## 2021-08-13 ENCOUNTER — Inpatient Hospital Stay: Payer: 59

## 2021-08-13 ENCOUNTER — Inpatient Hospital Stay: Payer: 59 | Attending: Internal Medicine

## 2021-08-13 ENCOUNTER — Other Ambulatory Visit: Payer: Self-pay

## 2021-08-13 VITALS — BP 154/108 | HR 95 | Temp 98.2°F | Resp 18 | Wt 124.4 lb

## 2021-08-13 VITALS — BP 134/94

## 2021-08-13 DIAGNOSIS — C719 Malignant neoplasm of brain, unspecified: Secondary | ICD-10-CM

## 2021-08-13 DIAGNOSIS — Z9221 Personal history of antineoplastic chemotherapy: Secondary | ICD-10-CM | POA: Insufficient documentation

## 2021-08-13 DIAGNOSIS — C711 Malignant neoplasm of frontal lobe: Secondary | ICD-10-CM | POA: Diagnosis not present

## 2021-08-13 DIAGNOSIS — Z923 Personal history of irradiation: Secondary | ICD-10-CM | POA: Diagnosis not present

## 2021-08-13 DIAGNOSIS — Z7901 Long term (current) use of anticoagulants: Secondary | ICD-10-CM | POA: Diagnosis not present

## 2021-08-13 DIAGNOSIS — Z79899 Other long term (current) drug therapy: Secondary | ICD-10-CM | POA: Insufficient documentation

## 2021-08-13 DIAGNOSIS — Z5112 Encounter for antineoplastic immunotherapy: Secondary | ICD-10-CM | POA: Insufficient documentation

## 2021-08-13 DIAGNOSIS — G40909 Epilepsy, unspecified, not intractable, without status epilepticus: Secondary | ICD-10-CM | POA: Diagnosis not present

## 2021-08-13 DIAGNOSIS — Z87891 Personal history of nicotine dependence: Secondary | ICD-10-CM | POA: Diagnosis not present

## 2021-08-13 LAB — CBC WITH DIFFERENTIAL (CANCER CENTER ONLY)
Abs Immature Granulocytes: 0.02 10*3/uL (ref 0.00–0.07)
Basophils Absolute: 0 10*3/uL (ref 0.0–0.1)
Basophils Relative: 1 %
Eosinophils Absolute: 0 10*3/uL (ref 0.0–0.5)
Eosinophils Relative: 1 %
HCT: 42.3 % (ref 36.0–46.0)
Hemoglobin: 14.9 g/dL (ref 12.0–15.0)
Immature Granulocytes: 1 %
Lymphocytes Relative: 13 %
Lymphs Abs: 0.5 10*3/uL — ABNORMAL LOW (ref 0.7–4.0)
MCH: 32.4 pg (ref 26.0–34.0)
MCHC: 35.2 g/dL (ref 30.0–36.0)
MCV: 92 fL (ref 80.0–100.0)
Monocytes Absolute: 0.5 10*3/uL (ref 0.1–1.0)
Monocytes Relative: 11 %
Neutro Abs: 3.2 10*3/uL (ref 1.7–7.7)
Neutrophils Relative %: 73 %
Platelet Count: 186 10*3/uL (ref 150–400)
RBC: 4.6 MIL/uL (ref 3.87–5.11)
RDW: 12.1 % (ref 11.5–15.5)
WBC Count: 4.3 10*3/uL (ref 4.0–10.5)
nRBC: 0 % (ref 0.0–0.2)

## 2021-08-13 LAB — CMP (CANCER CENTER ONLY)
ALT: 8 U/L (ref 0–44)
AST: 13 U/L — ABNORMAL LOW (ref 15–41)
Albumin: 3.9 g/dL (ref 3.5–5.0)
Alkaline Phosphatase: 42 U/L (ref 38–126)
Anion gap: 12 (ref 5–15)
BUN: 9 mg/dL (ref 6–20)
CO2: 23 mmol/L (ref 22–32)
Calcium: 9.9 mg/dL (ref 8.9–10.3)
Chloride: 104 mmol/L (ref 98–111)
Creatinine: 0.81 mg/dL (ref 0.44–1.00)
GFR, Estimated: >60 mL/min (ref >60)
Glucose, Bld: 138 mg/dL — ABNORMAL HIGH (ref 70–99)
Potassium: 3.6 mmol/L (ref 3.5–5.1)
Sodium: 139 mmol/L (ref 135–145)
Total Bilirubin: 0.6 mg/dL (ref 0.3–1.2)
Total Protein: 6.9 g/dL (ref 6.5–8.1)

## 2021-08-13 LAB — TOTAL PROTEIN, URINE DIPSTICK: Protein, ur: NEGATIVE mg/dL

## 2021-08-13 MED ORDER — SODIUM CHLORIDE 0.9 % IV SOLN: Freq: Once | INTRAVENOUS | Status: AC

## 2021-08-13 MED ORDER — SODIUM CHLORIDE 0.9 % IV SOLN
600.0000 mg | Freq: Once | INTRAVENOUS | Status: AC
  Administered 2021-08-13: 600 mg via INTRAVENOUS
  Filled 2021-08-13: qty 16

## 2021-08-13 MED ORDER — BACLOFEN 10 MG PO TABS
10.0000 mg | ORAL_TABLET | Freq: Three times a day (TID) | ORAL | 0 refills | Status: DC
  Filled 2021-08-13: qty 60, 20d supply, fill #0

## 2021-08-13 NOTE — Progress Notes (Signed)
[facility],  [id number] [phone]   Interval Evaluation  Date of Service: 08/13/21 Patient Name: Hannah [name] Patient MRN: [id number] Patient DOB: Nov 16, 1960 Provider: [name], MD  Identifying Statement:  Hannah [name] is a 60 y.o. female with left frontal glioblastoma   Oncologic History: Oncology History  GBM (glioblastoma multiforme) ([facility])   Initial Diagnosis   GBM (glioblastoma multiforme) ([facility])   02/17/2020 Surgery   Craniotomy, left frontal resection by Dr. Marcello Moores.  Path is glioblastoma.   08/28/2020 - 10/09/2020 Radiation Therapy   Radiation and concurrent Temozolomide 36m/m2 daily   11/10/2020 - 12/09/2020 Chemotherapy   Completes 1 cycle of 5-day Temozolomide      [facility]   Ad[facility] for [facility], undergoes thrombectomy and IVC filter placement   03/09/2021 Progression      03/27/2021 -  Chemotherapy   Initiates second line therapy with Avastin 168mkg IV q2 weeks    08/08/2021 -  Chemotherapy   Patient is on Treatment Plan : BRAIN GLIOBLASTOMA Consolidation Temozolomide Days 1-5 q28 Days        Biomarkers:  MGMT Methylated.  IDH 1/2 Wild type.  EGFR Unknown  TERT Unknown   Interval History:  Hannah [name] presents today for avastin infusion.  Fortunately no further breakthrough seizures.  Cognition and fatigue have continued to worsen but are unchanged from prior vsiit.  She is participating minimally in coversations, activities of daily living.  She is fully dependent on husband and family all ALDs.  Was unable to swallow the Temodar capsules last night due to fatigue, will attempt to try again today.  Continues on Vimpat to 1007m00mg.    Decadron 11/09/20: 2mg3m/10/22: 8mg 54m21/22: 6mg 046m7/22: 4mg 0324m/22: 8mg 04/72m22: 6mg 05/15m2: 3mg 05/2358m: 2mg 06/06/59m 1.5mg 06/20/250m-  H+P (01/20/20) Patient presented one month ago  with sudden onset left arm and leg weakness and interrupted speech, [name], [name], stroke eval and CNS imaging which demonstrated a non-enhancing left frontal mass.  At this time she is back to baseline without recurrence of events, taking Keppra 500mg twice p63may.  No history or seizure or any other neurologic events.  She does describe being sleep deprived prior to seizure event.  She presents today after one month follow up MRI scan.  Medications: Current Outpatient Medications on File Prior to Visit  Medication Sig Dispense Refill   apixaban (ELIQUIS) 5 MG TABS tablet TAKE 1 TABLET (5 MG TOTAL) BY MOUTH 2 (TWO) TIMES DAILY. 60 tablet 3   cholecalciferol (VITAMIN D3) 25 MCG (1000 UNIT) tablet Take 1,000 Units by mouth daily.     ciprofloxacin (CIPRO) 500 MG tablet Take 1 tablet (500 mg total) by mouth 2 (two) times daily. (Patient not taking: No sig reported) 28 tablet 0   docusate sodium (COLACE) 100 MG capsule Take 100 mg by mouth 2 (two) times daily as needed for mild constipation.     Lacosamide 100 MG TABS Take 1 tablet by mouth in the AM (100mg) and 2 t41mts by mouth at bedtime (200mg) 90 table70m  methylphenidate (RITALIN) 5 MG tablet Take 1 tablet (5 mg total) by mouth 2 (two) times daily. (Patient not taking: Reported on 07/30/2021) 60 tablet 0   ondansetron (ZOFRAN) 8 MG  tablet Take 1 tablet (8 mg total) by mouth 2 (two) times daily as needed (nausea and vomiting). May take 30-60 minutes prior to Temodar administration if nausea/vomiting occurs. 30 tablet 1   temozolomide (TEMODAR) 20 MG capsule Take 4 capsules (80 mg total) by mouth daily. May take on an empty stomach to decrease nausea & vomiting. 120 capsule 0   [DISCONTINUED] lamoTRIgine (LAMICTAL) 100 MG tablet Take 1 tablet (100 mg total) by mouth daily. 30 tablet 3   No current facility-administered  medications on file prior to visit.    Allergies: No Known Allergies Past Medical History:  Past Medical History:  Diagnosis Date   Cervical spondylolysis 12/15/2019   Skeleton: Mild cervical spondylosis C6-7. Incidental find.    Colon polyp    GBM (glioblastoma multiforme) (HCC)    Left ankle sprain 10/07/2011   Seizure ([place])    partial    Tick bite 02/2020   Past Surgical History:  Past Surgical History:  Procedure Laterality Date   APPLICATION OF CRANIAL NAVIGATION Left 02/16/2020   Procedure: APPLICATION OF CRANIAL NAVIGATION;  Surgeon: Vallarie Mare, MD;  Location: [facility];  Service: Neurosurgery;  Laterality: Left;   CRANIOTOMY Left 02/16/2020   Procedure: LEFT FRONTAL CRANIOTOMY FOR BRAIN TUMOR;  Surgeon: Vallarie Mare, MD;  Location: [facility];  Service: Neurosurgery;  Laterality: Left;   FOOT SURGERY Left    bone spur - local anesthesia   IR ANGIOGRAM PULMONARY BILATERAL SELECTIVE  12/15/2020   IR ANGIOGRAM SELECTIVE EACH ADDITIONAL VESSEL  12/15/2020   IR ANGIOGRAM SELECTIVE EACH ADDITIONAL VESSEL  12/15/2020   IR IVC FILTER PLMT / S&I /IMG GUID/MOD SED  12/15/2020   IR THROMBECT PRIM MECH INIT (INCLU) MOD SED  12/15/2020   IR THROMBECT PRIM MECH INIT (INCLU) MOD SED  12/15/2020   RADIOLOGY WITH ANESTHESIA N/A 12/15/2020   Procedure: IR WITH ANESTHESIA;  Surgeon: Radiologist, Medication, MD;  Location: [facility];  Service: Radiology;  Laterality: N/A;   TONSILECTOMY/ADENOIDECTOMY WITH MYRINGOTOMY     tonsils and adenoids out only   WISDOM TOOTH EXTRACTION     Social History:  Social History   Socioeconomic History   Marital status: Married    Spouse name: Not on file   Number of children: 3   Years of education: Not on file   Highest education level: Not on file  Occupational History   Not on file  Tobacco Use   Smoking status: Former    Years: 2.00    Types: Cigarettes    Quit date: 11/05/1979    Years since quitting: 41.8   Smokeless tobacco: Never   Tobacco  comments:    1/2 pack a week  Vaping Use   Vaping Use: Never used  Substance and Sexual Activity   Alcohol use: Yes    Alcohol/week: 0.0 standard drinks    Comment: 1 glass of wine weekly   Drug use: No   Sexual activity: Yes    Birth control/protection: Surgical  Other Topics Concern   Not on file  Social History Narrative   Not on file   Social Determinants of Health   Financial Resource Strain: Not on file  Food Insecurity: Not on file  Transportation Needs: Not on file  Physical Activity: Not on file  Stress: Not on file  Social Connections: Not on file  Intimate Partner Violence: Not on file   Family History:  Family History  Problem Relation Age of Onset   Hypertension Mother  Stroke Mother    Diabetes Mother    Colon cancer Father 41   Hypertension Sister    Colon polyps Sister    Heart disease Brother 87   Diabetes Maternal Uncle    Diabetes Maternal Uncle     Review of Systems: Constitutional: Doesn't report fevers, chills or abnormal weight loss Eyes: Doesn't report blurriness of vision Ears, nose, mouth, throat, and face: Doesn't report sore throat Respiratory: Chest wall pain Cardiovascular: Doesn't report palpitation, chest discomfort  Gastrointestinal:  Doesn't report nausea, constipation, diarrhea GU: Doesn't report incontinence Skin: Doesn't report skin rashes Neurological: Per HPI Musculoskeletal: R shoulder pain, limited ROM Behavioral/Psych: Doesn't report anxiety  Physical Exam: Vitals:   08/13/21 0932  BP: (!) 154/108  Pulse: 95  Resp: 18  Temp: 98.2 F (36.8 C)  SpO2: 96%   KPS: 60. General: Alert, cooperative, pleasant, in no acute distress Head: Normal EENT: No conjunctival injection or scleral icterus.  Lungs: Resp effort normal Cardiac: Regular rate Abdomen: Non-distended abdomen Skin: No rashes cyanosis or petechiae. Extremities: No clubbing or edema  Neurologic Exam: Mental Status: Awake, alert, attentive to  examiner. Both receptive and expressive dysphasia.  Cranial Nerves: Visual acuity is grossly normal. Visual fields are full. Extra-ocular movements intact. No ptosis. Face is symmetric Motor: Tone and bulk are normal. Right leg 3/5, left leg 4/5, proximal greater than distal weakness.  Noted R leg flexor spasticity. Reflexes are symmetric, no pathologic reflexes present.  Sensory: Intact to light touch Gait: Deferred today  Labs: I have reviewed the data as listed    Component Value Date/Time   NA 139 08/13/2021 0917   K 3.6 08/13/2021 0917   CL 104 08/13/2021 0917   CO2 23 08/13/2021 0917   GLUCOSE 138 (H) 08/13/2021 0917   GLUCOSE 85 10/13/2006 1158   BUN 9 08/13/2021 0917   CREATININE 0.81 08/13/2021 0917   CALCIUM 9.9 08/13/2021 0917   PROT 6.9 08/13/2021 0917   ALBUMIN 3.9 08/13/2021 0917   AST 13 (L) 08/13/2021 0917   ALT 8 08/13/2021 0917   ALKPHOS 42 08/13/2021 0917   BILITOT 0.6 08/13/2021 0917   GFRNONAA >60 08/13/2021 0917   GFRAA >60 02/17/2020 0136   Lab Results  Component Value Date   WBC 4.3 08/13/2021   NEUTROABS 3.2 08/13/2021   HGB 14.9 08/13/2021   HCT 42.3 08/13/2021   MCV 92.0 08/13/2021   PLT 186 08/13/2021   Imaging:  [facility] Clinician Interpretation: I have personally reviewed the CNS images as listed.  My interpretation, in the context of the patient's clinical presentation, is progressive disease  MR BRAIN W WO CONTRAST  Result Date: 07/28/2021 CLINICAL DATA:  Brain/CNS neoplasm, assess treatment response On chart review, patient initiated second-line therapy with Avastin on 03/27/2021. EXAM: MRI HEAD WITHOUT AND WITH CONTRAST TECHNIQUE: Multiplanar, multiecho pulse sequences of the brain and surrounding structures were obtained without and with intravenous contrast. CONTRAST:  42m GADAVIST GADOBUTROL 1 MMOL/ML IV SOLN COMPARISON:  Mar 09, 2021. FINDINGS: Brain: Heterogeneous mass spanning both cerebral hemispheres at the corpus callosum body with  hemorrhagic and necrotic features. Posteriorly the mass measures similar in size (approximately 4.1 by 2.5 cm); however, there is new faintly enhancing component anteriorly measuring approximately 2.4 x 1.6 cm which demonstrates restricted diffusion (see series 16, image 97; series 18, image 13; series 5, image 84; series 11, image 30). Similar generalized T2 hyperintensity in the bilateral hemispheric white matter. No midline shift. Basal cisterns are patent. No hydrocephalus. No  acute hemorrhage. Vascular: Major arterial flow voids are maintained at the skull base. Skull and upper cervical spine: Left frontal craniotomy. Otherwise, normal marrow signal. Sinuses/Orbits: Negative. Other: No mastoid effusions. IMPRESSION: Findings concerning for tumor progression with new tumor anteriorly with associated restricted diffusion, detailed above. This area only faintly enhances, likely related to Avastin treatment. Electronically Signed   By: Margaretha Sheffield M.D.   On: 07/28/2021 07:49    Assessment/Plan GBM (glioblastoma multiforme) (HCC)  Seizure disorder ([facility])  Kadeidra A Eckmann is clinically stable today.  We recommended initiating treatment with daily metronomic Temozolomide 50 mg/m2.  The patient will have a complete blood count performed on days 21 and 28 of each cycle, and a comprehensive metabolic panel performed on day 28 of each cycle. Labs may need to be performed more often. Zofran will prescribed for home use for nausea/vomiting.   Informed consent was obtained verbally at bedside to proceed with oral chemotherapy.  Chemotherapy should be held for the following:  ANC less than 1,000  Platelets less than 100,000  LFT or creatinine greater than 2x ULN  If clinical concerns/contraindications develop  In meantime, will continue treatment with Avastin 28m/kg IV q2 weeks.  Avastin will help treat inflammatory processes in the brain, and act as steroid sparing agent.  Avastin should be  held for the following:  ANC less than 500  Platelets less than 50,000  LFT or creatinine greater than 2x ULN  If clinical concerns/contraindications develop   Will con't Vimpat 200/100, Eliquis as prior.   Trial of Baclofen 132mq8 PRN for leg spasticity.  We ask that Madysun A Balbuena return to clinic in 2 weeks for next avastin infusion, or sooner if needed.   All questions were answered. The patient knows to call the clinic with any problems, questions or concerns. No barriers to learning were detected.  I have spent a total of 30 minutes of face-to-face and non-face-to-face time, excluding clinical staff time, preparing to see patient, ordering tests and/or medications, counseling the patient, and independently interpreting results and communicating results to the patient/family/caregiver    ZaVentura SellersMD Medical Director of Neuro-Oncology [facility] [place]0/10/22 10:48 AM

## 2021-08-13 NOTE — Patient Instructions (Signed)
[facility]  Discharge Instructions: Thank you for choosing [facility] to provide your oncology and hematology care.   If you have a lab appointment with the [facility], please go directly to the [facility] and check in at the registration area.   Wear comfortable clothing and clothing appropriate for easy access to any Portacath or PICC line.   We strive to give you quality time with your provider. You may need to reschedule your appointment if you arrive late (15 or more minutes).  Arriving late affects you and other patients whose appointments are after yours.  Also, if you miss three or more appointments without notifying the office, you may be dismissed from the clinic at the provider's discretion.      For prescription refill requests, have your pharmacy contact our office and allow 72 hours for refills to be completed.    Today you received the following chemotherapy and/or immunotherapy agents: MVASI    To help prevent nausea and vomiting after your treatment, we encourage you to take your nausea medication as directed.  BELOW ARE SYMPTOMS THAT SHOULD BE REPORTED IMMEDIATELY: *FEVER GREATER THAN 100.4 F (38 C) OR HIGHER *CHILLS OR SWEATING *NAUSEA AND VOMITING THAT IS NOT CONTROLLED WITH YOUR NAUSEA MEDICATION *UNUSUAL SHORTNESS OF BREATH *UNUSUAL BRUISING OR BLEEDING *URINARY PROBLEMS (pain or burning when urinating, or frequent urination) *BOWEL PROBLEMS (unusual diarrhea, constipation, pain near the anus) TENDERNESS IN MOUTH AND THROAT WITH OR WITHOUT PRESENCE OF ULCERS (sore throat, sores in mouth, or a toothache) UNUSUAL RASH, SWELLING OR PAIN  UNUSUAL VAGINAL DISCHARGE OR ITCHING   Items with * indicate a potential emergency and should be followed up as soon as possible or go to the Emergency Department if any problems should occur.  Please show the CHEMOTHERAPY ALERT CARD or IMMUNOTHERAPY ALERT CARD at check-in to the  Emergency Department and triage nurse.  Should you have questions after your visit or need to cancel or reschedule your appointment, please contact [facility]  Dept: [phone]  and follow the prompts.  Office hours are 8:00 a.m. to 4:30 p.m. Monday - Friday. Please note that voicemails left after 4:00 p.m. may not be returned until the following business day.  We are closed weekends and major holidays. You have access to a nurse at all times for urgent questions. Please call the main number to the clinic Dept: [phone] and follow the prompts.   For any non-urgent questions, you may also contact your provider using MyChart. We now offer e-Visits for anyone 35 and older to request care online for non-urgent symptoms. For details visit [email or web address].   Also download the MyChart app! Go to the app store, search "MyChart", open the app, select [facility], and log in with your MyChart username and password.  Due to Covid, a mask is required upon entering the hospital/clinic. If you do not have a mask, one will be given to you upon arrival. For doctor visits, patients may have 1 support person aged 7 or older with them. For treatment visits, patients cannot have anyone with them due to current Covid guidelines and our immunocompromised population.

## 2021-08-13 NOTE — Progress Notes (Signed)
Per Threasa Beards, pt's husband is allowed to be chairside in infusion today

## 2021-08-14 ENCOUNTER — Other Ambulatory Visit (HOSPITAL_BASED_OUTPATIENT_CLINIC_OR_DEPARTMENT_OTHER): Payer: Self-pay

## 2021-08-22 ENCOUNTER — Other Ambulatory Visit (HOSPITAL_BASED_OUTPATIENT_CLINIC_OR_DEPARTMENT_OTHER): Payer: Self-pay

## 2021-08-22 ENCOUNTER — Other Ambulatory Visit: Payer: Self-pay | Admitting: Internal Medicine

## 2021-08-22 MED ORDER — LACOSAMIDE 100 MG PO TABS
ORAL_TABLET | ORAL | 3 refills | Status: DC
  Filled 2021-08-22: qty 90, 30d supply, fill #0
  Filled 2021-09-20: qty 90, 30d supply, fill #1

## 2021-08-24 ENCOUNTER — Other Ambulatory Visit (HOSPITAL_BASED_OUTPATIENT_CLINIC_OR_DEPARTMENT_OTHER): Payer: Self-pay

## 2021-08-27 ENCOUNTER — Inpatient Hospital Stay (HOSPITAL_BASED_OUTPATIENT_CLINIC_OR_DEPARTMENT_OTHER): Payer: 59 | Admitting: Internal Medicine

## 2021-08-27 ENCOUNTER — Other Ambulatory Visit: Payer: Self-pay

## 2021-08-27 ENCOUNTER — Other Ambulatory Visit (HOSPITAL_BASED_OUTPATIENT_CLINIC_OR_DEPARTMENT_OTHER): Payer: Self-pay

## 2021-08-27 ENCOUNTER — Encounter: Payer: Self-pay | Admitting: Internal Medicine

## 2021-08-27 ENCOUNTER — Inpatient Hospital Stay: Payer: 59

## 2021-08-27 VITALS — BP 130/83 | HR 71

## 2021-08-27 VITALS — BP 147/97 | HR 97 | Temp 98.0°F | Resp 18 | Wt 131.3 lb

## 2021-08-27 DIAGNOSIS — Z9221 Personal history of antineoplastic chemotherapy: Secondary | ICD-10-CM | POA: Diagnosis not present

## 2021-08-27 DIAGNOSIS — C719 Malignant neoplasm of brain, unspecified: Secondary | ICD-10-CM | POA: Diagnosis not present

## 2021-08-27 DIAGNOSIS — Z7901 Long term (current) use of anticoagulants: Secondary | ICD-10-CM | POA: Diagnosis not present

## 2021-08-27 DIAGNOSIS — G40909 Epilepsy, unspecified, not intractable, without status epilepticus: Secondary | ICD-10-CM | POA: Diagnosis not present

## 2021-08-27 DIAGNOSIS — Z79899 Other long term (current) drug therapy: Secondary | ICD-10-CM | POA: Diagnosis not present

## 2021-08-27 DIAGNOSIS — Z87891 Personal history of nicotine dependence: Secondary | ICD-10-CM | POA: Diagnosis not present

## 2021-08-27 DIAGNOSIS — Z5112 Encounter for antineoplastic immunotherapy: Secondary | ICD-10-CM | POA: Diagnosis not present

## 2021-08-27 DIAGNOSIS — Z923 Personal history of irradiation: Secondary | ICD-10-CM | POA: Diagnosis not present

## 2021-08-27 DIAGNOSIS — C711 Malignant neoplasm of frontal lobe: Secondary | ICD-10-CM | POA: Diagnosis not present

## 2021-08-27 LAB — CBC WITH DIFFERENTIAL (CANCER CENTER ONLY)
Abs Immature Granulocytes: 0.02 10*3/uL (ref 0.00–0.07)
Basophils Absolute: 0 10*3/uL (ref 0.0–0.1)
Basophils Relative: 1 %
Eosinophils Absolute: 0.1 10*3/uL (ref 0.0–0.5)
Eosinophils Relative: 1 %
HCT: 45.2 % (ref 36.0–46.0)
Hemoglobin: 15.5 g/dL — ABNORMAL HIGH (ref 12.0–15.0)
Immature Granulocytes: 1 %
Lymphocytes Relative: 13 %
Lymphs Abs: 0.5 10*3/uL — ABNORMAL LOW (ref 0.7–4.0)
MCH: 32.4 pg (ref 26.0–34.0)
MCHC: 34.3 g/dL (ref 30.0–36.0)
MCV: 94.4 fL (ref 80.0–100.0)
Monocytes Absolute: 0.4 10*3/uL (ref 0.1–1.0)
Monocytes Relative: 9 %
Neutro Abs: 3 10*3/uL (ref 1.7–7.7)
Neutrophils Relative %: 75 %
Platelet Count: 177 10*3/uL (ref 150–400)
RBC: 4.79 MIL/uL (ref 3.87–5.11)
RDW: 12.6 % (ref 11.5–15.5)
WBC Count: 4 10*3/uL (ref 4.0–10.5)
nRBC: 0 % (ref 0.0–0.2)

## 2021-08-27 LAB — CMP (CANCER CENTER ONLY)
ALT: 11 U/L (ref 0–44)
AST: 19 U/L (ref 15–41)
Albumin: 3.9 g/dL (ref 3.5–5.0)
Alkaline Phosphatase: 42 U/L (ref 38–126)
Anion gap: 8 (ref 5–15)
BUN: 16 mg/dL (ref 6–20)
CO2: 25 mmol/L (ref 22–32)
Calcium: 9.4 mg/dL (ref 8.9–10.3)
Chloride: 105 mmol/L (ref 98–111)
Creatinine: 0.62 mg/dL (ref 0.44–1.00)
GFR, Estimated: >60 mL/min (ref >60)
Glucose, Bld: 106 mg/dL — ABNORMAL HIGH (ref 70–99)
Potassium: 3.6 mmol/L (ref 3.5–5.1)
Sodium: 138 mmol/L (ref 135–145)
Total Bilirubin: 0.7 mg/dL (ref 0.3–1.2)
Total Protein: 6.9 g/dL (ref 6.5–8.1)

## 2021-08-27 LAB — TOTAL PROTEIN, URINE DIPSTICK: Protein, ur: <30 mg/dL

## 2021-08-27 MED ORDER — BACLOFEN 5 MG PO TABS
5.0000 mg | ORAL_TABLET | Freq: Three times a day (TID) | ORAL | 3 refills | Status: DC
  Filled 2021-08-27: qty 90, 30d supply, fill #0
  Filled 2021-10-02: qty 90, 30d supply, fill #1

## 2021-08-27 MED ORDER — SODIUM CHLORIDE 0.9 % IV SOLN
600.0000 mg | Freq: Once | INTRAVENOUS | Status: AC
  Administered 2021-08-27: 600 mg via INTRAVENOUS
  Filled 2021-08-27: qty 16

## 2021-08-27 MED ORDER — SODIUM CHLORIDE 0.9 % IV SOLN: Freq: Once | INTRAVENOUS | Status: AC

## 2021-08-27 NOTE — Progress Notes (Signed)
Referral placed to [facility].

## 2021-08-27 NOTE — Progress Notes (Signed)
[facility] [place], [place] [address] [phone]   Interval Evaluation  Date of Service: 08/27/21 Patient Name: Hannah [name] Patient MRN: [id number] Patient DOB: 1961/01/22 Provider: [name], MD  Identifying Statement:  Hannah [name] is a 60 y.o. female with left frontal glioblastoma   Oncologic History: Oncology History  GBM (glioblastoma multiforme) ([facility])   Initial Diagnosis   GBM (glioblastoma multiforme) ([facility])   02/17/2020 Surgery   Craniotomy, left frontal resection by Dr. Marcello Moores.  Path is glioblastoma.   08/28/2020 - 10/09/2020 Radiation Therapy   Radiation and concurrent Temozolomide 66m/m2 daily   11/10/2020 - 12/09/2020 Chemotherapy   Completes 1 cycle of 5-day Temozolomide      [facility]   Admitted for saddle PE, undergoes thrombectomy and IVC filter placement   03/09/2021 Progression      03/27/2021 -  Chemotherapy   Initiates second line therapy with Avastin 135mkg IV q2 weeks    08/08/2021 -  Chemotherapy   Patient is on Treatment Plan : BRAIN GLIOBLASTOMA Consolidation Temozolomide Days 1-5 q28 Days        Biomarkers:  MGMT Methylated.  IDH 1/2 Wild type.  EGFR Unknown  TERT Unknown   Interval History:  Hannah [name] presents today for avastin infusion.  Modest improvement in pace of speech and energy this past week.  [place] She is still participating minimally in coversations, activities of daily living, fully dependent on husband and family all ALDs.  Started Temodar on 08/13/21, some days she is unable to swallow all of the pills.  Baclofen has helped with spasticity but she takes half of the 1052mill.  Continues on Vimpat to 100m24m0mg.    Decadron 11/09/20: 2mg 75m10/22: 8mg 067m1/22: 6mg 0322m/22: 4mg 03/3m22: 8mg 04/112m2: 6mg 05/1241m: 3mg 05/23/4m 2mg 06/06/263m1.5mg 06/20/2267m  H+P (01/20/20) Patient  presented one month ago with sudden onset left arm and leg weakness and interrupted speech, [name], [name], stroke eval and CNS imaging which demonstrated a non-enhancing left frontal mass.  At this time she is back to baseline without recurrence of events, taking Keppra 500mg twice pe42my.  No history or seizure or any other neurologic events.  She does describe being sleep deprived prior to seizure event.  She presents today after one month follow up MRI scan.  Medications: Current Outpatient Medications on File Prior to Visit  Medication Sig Dispense Refill   apixaban (ELIQUIS) 5 MG TABS tablet TAKE 1 TABLET (5 MG TOTAL) BY MOUTH 2 (TWO) TIMES DAILY. 60 tablet 3   baclofen (LIORESAL) 10 MG tablet Take 1 tablet (10 mg total) by mouth 3 (three) times daily. 60 each 0   cholecalciferol (VITAMIN D3) 25 MCG (1000 UNIT) tablet Take 1,000 Units by mouth daily.     ciprofloxacin (CIPRO) 500 MG tablet Take 1 tablet (500 mg total) by mouth 2 (two) times daily. (Patient not taking: No sig reported) 28 tablet 0   docusate sodium (COLACE) 100 MG capsule Take 100 mg by mouth 2 (two) times daily as needed for mild constipation.     Lacosamide 100 MG TABS Take 1 tablet by mouth in the morning (100mg) and 2 ta62ms by mouth at bedtime (200mg) 90 tablet29m methylphenidate (RITALIN)  5 MG tablet Take 1 tablet (5 mg total) by mouth 2 (two) times daily. (Patient not taking: Reported on 07/30/2021) 60 tablet 0   ondansetron (ZOFRAN) 8 MG tablet Take 1 tablet (8 mg total) by mouth 2 (two) times daily as needed (nausea and vomiting). May take 30-60 minutes prior to Temodar administration if nausea/vomiting occurs. 30 tablet 1   temozolomide (TEMODAR) 20 MG capsule Take 4 capsules (80 mg total) by mouth daily. May take on an empty stomach to decrease nausea & vomiting. 120 capsule 0   [DISCONTINUED]  lamoTRIgine (LAMICTAL) 100 MG tablet Take 1 tablet (100 mg total) by mouth daily. 30 tablet 3   No current facility-administered medications on file prior to visit.    Allergies: No Known Allergies Past Medical History:  Past Medical History:  Diagnosis Date   Cervical spondylolysis 12/15/2019   Skeleton: Mild cervical spondylosis C6-7. Incidental find.    Colon polyp    GBM (glioblastoma multiforme) ([facility])    Left ankle sprain 10/07/2011   Seizure ([facility])    partial    Tick bite 02/2020   Past Surgical History:  Past Surgical History:  Procedure Laterality Date   APPLICATION OF CRANIAL NAVIGATION Left 02/16/2020   Procedure: APPLICATION OF CRANIAL NAVIGATION;  Surgeon: Vallarie Mare, MD;  Location: [facility];  Service: Neurosurgery;  Laterality: Left;   CRANIOTOMY Left 02/16/2020   Procedure: LEFT FRONTAL CRANIOTOMY FOR BRAIN TUMOR;  Surgeon: Vallarie Mare, MD;  Location: [facility];  Service: Neurosurgery;  Laterality: Left;   FOOT SURGERY Left    bone spur - local anesthesia   IR ANGIOGRAM PULMONARY BILATERAL SELECTIVE  12/15/2020   IR ANGIOGRAM SELECTIVE EACH ADDITIONAL VESSEL  12/15/2020   IR ANGIOGRAM SELECTIVE EACH ADDITIONAL VESSEL  12/15/2020   IR IVC FILTER PLMT / S&I /IMG GUID/MOD SED  12/15/2020   IR THROMBECT PRIM MECH INIT (INCLU) MOD SED  12/15/2020   IR THROMBECT PRIM MECH INIT (INCLU) MOD SED  12/15/2020   RADIOLOGY WITH ANESTHESIA N/A 12/15/2020   Procedure: IR WITH ANESTHESIA;  Surgeon: Radiologist, Medication, MD;  Location: [facility];  Service: Radiology;  Laterality: N/A;   TONSILECTOMY/ADENOIDECTOMY WITH MYRINGOTOMY     tonsils and adenoids out only   WISDOM TOOTH EXTRACTION     Social History:  Social History   Socioeconomic History   Marital status: Married    Spouse name: Not on file   Number of children: 3   Years of education: Not on file   Highest education level: Not on file  Occupational History   Not on file  Tobacco Use   Smoking status: Former     Years: 2.00    Types: Cigarettes    Quit date: 11/05/1979    Years since quitting: 41.8   Smokeless tobacco: Never   Tobacco comments:    1/2 pack a week  Vaping Use   Vaping Use: Never used  Substance and Sexual Activity   Alcohol use: Yes    Alcohol/week: 0.0 standard drinks    Comment: 1 glass of wine weekly   Drug use: No   Sexual activity: Yes    Birth control/protection: Surgical  Other Topics Concern   Not on file  Social History Narrative   Not on file   Social Determinants of Health   Financial Resource Strain: Not on file  Food Insecurity: Not on file  Transportation Needs: Not on file  Physical Activity: Not on file  Stress: Not on file  Social  Connections: Not on file  Intimate Partner Violence: Not on file   Family History:  Family History  Problem Relation Age of Onset   Hypertension Mother    Stroke Mother    Diabetes Mother    Colon cancer Father 92   Hypertension Sister    Colon polyps Sister    Heart disease Brother 20   Diabetes Maternal Uncle    Diabetes Maternal Uncle     Review of Systems: Constitutional: Doesn't report fevers, chills or abnormal weight loss Eyes: Doesn't report blurriness of vision Ears, nose, mouth, throat, and face: Doesn't report sore throat Respiratory: Chest wall pain Cardiovascular: Doesn't report palpitation, chest discomfort  Gastrointestinal:  Doesn't report nausea, constipation, diarrhea GU: Doesn't report incontinence Skin: Doesn't report skin rashes Neurological: Per HPI Musculoskeletal: R shoulder pain, limited ROM Behavioral/Psych: Doesn't report anxiety  Physical Exam: Vitals:   08/27/21 0948  BP: (!) 147/97  Pulse: 97  Resp: 18  Temp: 98 F (36.7 C)  SpO2: 92%    KPS: 60. General: Alert, cooperative, pleasant, in no acute distress Head: Normal EENT: No conjunctival injection or scleral icterus.  Lungs: Resp effort normal Cardiac: Regular rate Abdomen: Non-distended abdomen Skin: No rashes  cyanosis or petechiae. Extremities: No clubbing or edema  Neurologic Exam: Mental Status: Awake, alert, attentive to examiner. Both receptive and expressive dysphasia.  Cranial Nerves: Visual acuity is grossly normal. Visual fields are full. Extra-ocular movements intact. No ptosis. Face is symmetric Motor: Tone and bulk are normal. Right leg 3/5, left leg 4/5, proximal greater than distal weakness.  Noted R leg flexor spasticity. Reflexes are symmetric, no pathologic reflexes present.  Sensory: Intact to light touch Gait: Deferred today  Labs: I have reviewed the data as listed    Component Value Date/Time   NA 139 08/13/2021 0917   K 3.6 08/13/2021 0917   CL 104 08/13/2021 0917   CO2 23 08/13/2021 0917   GLUCOSE 138 (H) 08/13/2021 0917   GLUCOSE 85 10/13/2006 1158   BUN 9 08/13/2021 0917   CREATININE 0.81 08/13/2021 0917   CALCIUM 9.9 08/13/2021 0917   PROT 6.9 08/13/2021 0917   ALBUMIN 3.9 08/13/2021 0917   AST 13 (L) 08/13/2021 0917   ALT 8 08/13/2021 0917   ALKPHOS 42 08/13/2021 0917   BILITOT 0.6 08/13/2021 0917   GFRNONAA >60 08/13/2021 0917   GFRAA >60 02/17/2020 0136   Lab Results  Component Value Date   WBC 4.3 08/13/2021   NEUTROABS 3.2 08/13/2021   HGB 14.9 08/13/2021   HCT 42.3 08/13/2021   MCV 92.0 08/13/2021   PLT 186 08/13/2021    Assessment/Plan GBM (glioblastoma multiforme) (HCC)  Seizure disorder (HCC)  Hannah [name] is clinically stable today.  Labs are within normal limits.  We recommended continuing treatment with daily metronomic Temozolomide 50 mg/m2, now day 15/28 of cycle #1.  The patient will have a complete blood count performed on days 21 and 28 of each cycle, and a comprehensive metabolic panel performed on day 28 of each cycle. Labs may need to be performed more often. Zofran will prescribed for home use for nausea/vomiting.   Chemotherapy should be held for the following:  ANC less than 1,000  Platelets less than 100,000  LFT  or creatinine greater than 2x ULN  If clinical concerns/contraindications develop  In meantime, will continue treatment with Avastin 10m/kg IV q2 weeks.  Avastin will help treat inflammatory processes in the brain, and act as steroid sparing agent.  Avastin  should be held for the following:  ANC less than 500  Platelets less than 50,000  LFT or creatinine greater than 2x ULN  If clinical concerns/contraindications develop   Avastin may be moved out to 3 weeks if preferred.  Will con't Vimpat 200/100, Eliquis as prior.   Will reduce Baclofen to 75m q8 PRN due to drowsiness with 161m  We ask that Hannah [name] return to clinic in 2 weeks for next avastin infusion, or sooner if needed.   All questions were answered. The patient knows to call the clinic with any problems, questions or concerns. No barriers to learning were detected.  I have spent a total of 30 minutes of face-to-face and non-face-to-face time, excluding clinical staff time, preparing to see patient, ordering tests and/or medications, counseling the patient, and independently interpreting results and communicating results to the patient/family/caregiver    ZaVentura SellersMD Medical Director of Neuro-Oncology [facility] We[facility]ia0/24/22 9:26 AM

## 2021-08-27 NOTE — Patient Instructions (Signed)
[facility]ICAL [facility]COL[facility]  Discharge Instructions: °Thank you for choosing [facility] to provide your oncology and hematology care.  ° °If you have a lab appointment with the Cancer Center, please go directly to the Cancer Center and check in at the registration area. °  °Wear comfortable clothing and clothing appropriate for easy access to any Portacath or PICC line.  ° °We strive to give you quality time with your provider. You may need to reschedule your appointment if you arrive late (15 or more minutes).  Arriving late affects you and other patients whose appointments are after yours.  Also, if you miss three or more appointments without notifying the office, you may be dismissed from the clinic at the provider’s discretion.    °  °For prescription refill requests, have your pharmacy contact our office and allow 72 hours for refills to be completed.   ° °Today you received the following chemotherapy and/or immunotherapy agent: Bevacizumab (MVASI) °  °To help prevent nausea and vomiting after your treatment, we encourage you to take your nausea medication as directed. ° °BELOW ARE SYMPTOMS THAT SHOULD BE REPORTED IMMEDIATELY: °*FEVER GREATER THAN 100.4 F (38 °C) OR HIGHER °*CHILLS OR SWEATING °*NAUSEA AND VOMITING THAT IS NOT CONTROLLED WITH YOUR NAUSEA MEDICATION °*UNUSUAL SHORTNESS OF BREATH °*UNUSUAL BRUISING OR BLEEDING °*URINARY PROBLEMS (pain or burning when urinating, or frequent urination) °*BOWEL PROBLEMS (unusual diarrhea, constipation, pain near the anus) °TENDERNESS IN MOUTH AND THROAT WITH OR WITHOUT PRESENCE OF ULCERS (sore throat, sores in mouth, or a toothache) °UNUSUAL RASH, SWELLING OR PAIN  °UNUSUAL VAGINAL DISCHARGE OR ITCHING  ° °Items with * indicate a potential emergency and should be followed up as soon as possible or go to the Emergency Department if any problems should occur. ° °Please show the CHEMOTHERAPY ALERT CARD or IMMUNOTHERAPY ALERT CARD at  check-in to the Emergency Department and triage nurse. ° °Should you have questions after your visit or need to cancel or reschedule your appointment, please contact [facility]  Dept: [phone]  and follow the prompts.  Office hours are 8:00 a.m. to 4:30 p.m. Monday - Friday. Please note that voicemails left after 4:00 p.m. may not be returned until the following business day.  We are closed weekends and major holidays. You have access to a nurse at all times for urgent questions. Please call the main number to the clinic Dept: [phone] and follow the prompts. ° ° °For any non-urgent questions, you may also contact your provider using MyChart. We now offer e-Visits for anyone 18 and older to request care online for non-urgent symptoms. For details visit [email or web address]. °  °Also download the MyChart app! Go to the app store, search "MyChart", open the app, select , and log in with your MyChart username and password. ° °Due to Covid, a mask is required upon entering the hospital/clinic. If you do not have a mask, one will be given to you upon arrival. For doctor visits, patients may have 1 support person aged 18 or older with them. For treatment visits, patients cannot have anyone with them due to current Covid guidelines and our immunocompromised population.  ° °

## 2021-08-28 ENCOUNTER — Telehealth: Payer: Self-pay

## 2021-08-28 DIAGNOSIS — Z515 Encounter for palliative care: Secondary | ICD-10-CM

## 2021-08-28 NOTE — Telephone Encounter (Signed)
(  2:17 pm) SW scheduled initial palliative care visit with patient' husband-Keith. SW also provided education to him regarding the palliative care program. Palliative RN/SW team scheduled for 09/04/21 @ 12:30 pm.

## 2021-08-29 ENCOUNTER — Other Ambulatory Visit (HOSPITAL_COMMUNITY): Payer: Self-pay

## 2021-08-29 ENCOUNTER — Other Ambulatory Visit: Payer: Self-pay | Admitting: Internal Medicine

## 2021-08-29 DIAGNOSIS — C719 Malignant neoplasm of brain, unspecified: Secondary | ICD-10-CM

## 2021-08-30 ENCOUNTER — Other Ambulatory Visit (HOSPITAL_COMMUNITY): Payer: Self-pay

## 2021-08-30 ENCOUNTER — Encounter: Payer: Self-pay | Admitting: Internal Medicine

## 2021-08-30 MED ORDER — TEMOZOLOMIDE 20 MG PO CAPS
80.0000 mg | ORAL_CAPSULE | Freq: Every day | ORAL | 0 refills | Status: DC
  Filled 2021-09-06 – 2021-09-20 (×3): qty 120, 30d supply, fill #0

## 2021-09-01 DIAGNOSIS — C719 Malignant neoplasm of brain, unspecified: Secondary | ICD-10-CM | POA: Diagnosis not present

## 2021-09-04 ENCOUNTER — Other Ambulatory Visit: Payer: Self-pay

## 2021-09-04 ENCOUNTER — Other Ambulatory Visit: Payer: 59

## 2021-09-04 ENCOUNTER — Other Ambulatory Visit: Payer: 59 | Admitting: *Deleted

## 2021-09-04 VITALS — BP 128/91 | HR 83 | Temp 97.6°F | Resp 16

## 2021-09-04 DIAGNOSIS — Z515 Encounter for palliative care: Secondary | ICD-10-CM

## 2021-09-04 NOTE — Progress Notes (Signed)
COMMUNITY PALLIATIVE CARE SW NOTE  PATIENT NAME: Hannah [name] Frie DOB: 07/07/1961 MRN: [id number]  PRIMARY CARE PROVIDER: [name], DO  RESPONSIBLE PARTY:  Acct ID - Guarantor Home Phone Work Phone Relationship Acct Type  [id number] VELNA, [phone]  Self P/F     [address], [place], [place] [address]     PLAN OF CARE and INTERVENTIONS:             GOALS OF CARE/ ADVANCE CARE PLANNING:  Goal is for patient to remain at home. Patient has advance directives. SOCIAL/EMOTIONAL/SPIRITUAL ASSESSMENT/ INTERVENTIONS:  SW and RN-M.Nadara Mustard provided initial palliative care visit with patient at her home where she was present with her husband-Keith and daughter-Alex. The team provided education regarding the palliative care program and patient's husband provided verbal consent to services. Patient was observed laying in her recliner, awake, alert and oriented to self. She gave eye contact and was responsive to simply yes/no questions. Prior to her illness patient lived a very healthy and physically active life that included exercise and eating healthy. Patient has a diagnosis of glioblastoma multiforme (GBM). Patient's health started to decline approximately two years ago following a seizure, and later a small tumor in her brain was found. Patient had a craniotomy. Additional tumors have been found. Patient has started a round of chemotherapy, which has resulted in increased  symptoms of fatigue and weakness, unable to sleep, irritable and anxious. The symptoms usually persist in the evening, which is also when patient has had noted seizure activity. Patient is having episodes where she forgets to chew/swallow her food and occasionally her pills. Her family advises that it is the modeling, encouragement and repetition of an act that patient is able to swallow. Patient is having increased cramping in her legs, particularly her right leg and is remedied by use of muscle relaxer, massaging  and stretching the leg out. Patient is able to stand and pivot, but ambulated by her family with her wheelchair. Her daughter and husband serve as her primary caregivers. They have a system and schedule in place to toilet patient every 2 hours and shower her at least 2x/week. Patient also has a small area on her bottom, that the family is treating with zinc cream. The family inquired about using purewick to use with patient at night. The RN provided education regarding the device, but also reaffirmed their current care techniques as appropriate. The family was provided donated chux pads by the team. SW provided supportive presence, active listening, observation, education and reassurance of support. The patient/family remain open to ongoing palliative care support.  PATIENT/CAREGIVER EDUCATION/ COPING:  Patient appears to be coping well. Patient has a strong family support through her husband and daughter. Her family is committed to keep patient home. PERSONAL EMERGENCY PLAN:  911 can be activated for emergencies. COMMUNITY RESOURCES COORDINATION/ HEALTH CARE NAVIGATION:  None. FINANCIAL/LEGAL CONCERNS/INTERVENTIONS:  None.     SOCIAL HX:  Social History   Tobacco Use   Smoking status: Former    Years: 2.00    Types: Cigarettes    Quit date: 11/05/1979    Years since quitting: 41.8   Smokeless tobacco: Never   Tobacco comments:    1/2 pack a week  Substance Use Topics   Alcohol use: Yes    Alcohol/week: 0.0 standard drinks    Comment: 1 glass of wine weekly    CODE STATUS: To be further assessed. ADVANCED DIRECTIVES: Yes MOST FORM COMPLETE:  No HOSPICE EDUCATION PROVIDED: No  PPS: Patient is alert and oriented to self and place. Patient is dependent for personal care needs. Patient ambulated in her wheelchair by family.   Duration of visit and documentation: 90 minutes.   [address] [place], [place]

## 2021-09-06 ENCOUNTER — Telehealth: Payer: Self-pay

## 2021-09-06 ENCOUNTER — Other Ambulatory Visit (HOSPITAL_COMMUNITY): Payer: Self-pay

## 2021-09-06 NOTE — Telephone Encounter (Signed)
Oral Oncology Patient Advocate Encounter  Prior Authorization for Temodar has been approved.    PA# [id number] Effective dates: 09/06/21 through 09/05/22  Patients co-pay is $0  Oral Oncology Clinic will continue to follow.   [place] Patient [place] Phone [phone] Fax [phone] 09/06/2021 4:36 PM

## 2021-09-06 NOTE — Telephone Encounter (Signed)
Oral Oncology Patient Advocate Encounter   Received notification from [facility] that prior authorization for Temodar is required.   PA submitted on CoverMyMeds Key [id number] Status is pending   Oral Oncology Clinic will continue to follow.   [place] Patient [place] Phone [phone] Fax [phone] 09/06/2021 8:29 AM

## 2021-09-07 ENCOUNTER — Other Ambulatory Visit (HOSPITAL_COMMUNITY): Payer: Self-pay

## 2021-09-10 ENCOUNTER — Telehealth: Payer: Self-pay | Admitting: Internal Medicine

## 2021-09-10 ENCOUNTER — Other Ambulatory Visit: Payer: Self-pay

## 2021-09-10 ENCOUNTER — Inpatient Hospital Stay: Payer: 59 | Attending: Internal Medicine

## 2021-09-10 ENCOUNTER — Inpatient Hospital Stay: Payer: 59

## 2021-09-10 ENCOUNTER — Inpatient Hospital Stay (HOSPITAL_BASED_OUTPATIENT_CLINIC_OR_DEPARTMENT_OTHER): Payer: 59 | Admitting: Internal Medicine

## 2021-09-10 ENCOUNTER — Other Ambulatory Visit (HOSPITAL_BASED_OUTPATIENT_CLINIC_OR_DEPARTMENT_OTHER): Payer: Self-pay

## 2021-09-10 VITALS — BP 148/106 | HR 73 | Temp 97.5°F | Resp 18 | Wt 132.2 lb

## 2021-09-10 VITALS — BP 148/94

## 2021-09-10 DIAGNOSIS — Z5112 Encounter for antineoplastic immunotherapy: Secondary | ICD-10-CM | POA: Diagnosis not present

## 2021-09-10 DIAGNOSIS — Z79899 Other long term (current) drug therapy: Secondary | ICD-10-CM | POA: Insufficient documentation

## 2021-09-10 DIAGNOSIS — Z923 Personal history of irradiation: Secondary | ICD-10-CM | POA: Diagnosis not present

## 2021-09-10 DIAGNOSIS — Z86711 Personal history of pulmonary embolism: Secondary | ICD-10-CM | POA: Diagnosis not present

## 2021-09-10 DIAGNOSIS — C719 Malignant neoplasm of brain, unspecified: Secondary | ICD-10-CM

## 2021-09-10 DIAGNOSIS — Z9221 Personal history of antineoplastic chemotherapy: Secondary | ICD-10-CM | POA: Insufficient documentation

## 2021-09-10 DIAGNOSIS — C711 Malignant neoplasm of frontal lobe: Secondary | ICD-10-CM | POA: Diagnosis not present

## 2021-09-10 DIAGNOSIS — Z7901 Long term (current) use of anticoagulants: Secondary | ICD-10-CM | POA: Insufficient documentation

## 2021-09-10 LAB — CBC WITH DIFFERENTIAL (CANCER CENTER ONLY)
Abs Immature Granulocytes: 0.01 10*3/uL (ref 0.00–0.07)
Basophils Absolute: 0 10*3/uL (ref 0.0–0.1)
Basophils Relative: 1 %
Eosinophils Absolute: 0.1 10*3/uL (ref 0.0–0.5)
Eosinophils Relative: 2 %
HCT: 42.8 % (ref 36.0–46.0)
Hemoglobin: 14.3 g/dL (ref 12.0–15.0)
Immature Granulocytes: 0 %
Lymphocytes Relative: 18 %
Lymphs Abs: 0.7 10*3/uL (ref 0.7–4.0)
MCH: 32.5 pg (ref 26.0–34.0)
MCHC: 33.4 g/dL (ref 30.0–36.0)
MCV: 97.3 fL (ref 80.0–100.0)
Monocytes Absolute: 0.4 10*3/uL (ref 0.1–1.0)
Monocytes Relative: 11 %
Neutro Abs: 2.4 10*3/uL (ref 1.7–7.7)
Neutrophils Relative %: 68 %
Platelet Count: 182 10*3/uL (ref 150–400)
RBC: 4.4 MIL/uL (ref 3.87–5.11)
RDW: 12.9 % (ref 11.5–15.5)
WBC Count: 3.6 10*3/uL — ABNORMAL LOW (ref 4.0–10.5)
nRBC: 0 % (ref 0.0–0.2)

## 2021-09-10 LAB — CMP (CANCER CENTER ONLY)
ALT: 13 U/L (ref 0–44)
AST: 16 U/L (ref 15–41)
Albumin: 3.7 g/dL (ref 3.5–5.0)
Alkaline Phosphatase: 54 U/L (ref 38–126)
Anion gap: 10 (ref 5–15)
BUN: 17 mg/dL (ref 6–20)
CO2: 25 mmol/L (ref 22–32)
Calcium: 9.4 mg/dL (ref 8.9–10.3)
Chloride: 106 mmol/L (ref 98–111)
Creatinine: 0.76 mg/dL (ref 0.44–1.00)
GFR, Estimated: >60 mL/min (ref >60)
Glucose, Bld: 100 mg/dL — ABNORMAL HIGH (ref 70–99)
Potassium: 3.6 mmol/L (ref 3.5–5.1)
Sodium: 141 mmol/L (ref 135–145)
Total Bilirubin: 0.4 mg/dL (ref 0.3–1.2)
Total Protein: 6.7 g/dL (ref 6.5–8.1)

## 2021-09-10 LAB — TOTAL PROTEIN, URINE DIPSTICK: Protein, ur: NEGATIVE mg/dL

## 2021-09-10 MED ORDER — SODIUM CHLORIDE 0.9 % IV SOLN: Freq: Once | INTRAVENOUS | Status: AC

## 2021-09-10 MED ORDER — SODIUM CHLORIDE 0.9 % IV SOLN
600.0000 mg | Freq: Once | INTRAVENOUS | Status: AC
  Administered 2021-09-10: 600 mg via INTRAVENOUS
  Filled 2021-09-10: qty 16

## 2021-09-10 MED ORDER — AMLODIPINE BESYLATE 5 MG PO TABS
5.0000 mg | ORAL_TABLET | Freq: Every day | ORAL | 3 refills | Status: DC
  Filled 2021-09-10: qty 90, 90d supply, fill #0
  Filled 2021-12-12: qty 30, 30d supply, fill #1

## 2021-09-10 NOTE — Progress Notes (Signed)
[facility] [place], [place] [address] [phone]   Interval Evaluation  Date of Service: 09/10/21 Patient Name: Hannah [name] Patient MRN: [id number] Patient DOB: Oct 02, 1961 Provider: [name], MD  Identifying Statement:  Hannah [name] is a 60 y.o. female with left frontal glioblastoma   Oncologic History: Oncology History  GBM (glioblastoma multiforme) ([facility])   Initial Diagnosis   GBM (glioblastoma multiforme) ([facility])   02/17/2020 Surgery   Craniotomy, left frontal resection by Dr. Marcello Moores.  Path is glioblastoma.   08/28/2020 - 10/09/2020 Radiation Therapy   Radiation and concurrent Temozolomide 26m/m2 daily   11/10/2020 - 12/09/2020 Chemotherapy   Completes 1 cycle of 5-day Temozolomide      [facility]   [facility] for [facility], undergoes thrombectomy and IVC filter placement   03/09/2021 Progression      03/27/2021 -  Chemotherapy   Initiates second line therapy with Avastin 116mkg IV q2 weeks    08/08/2021 -  Chemotherapy   Patient is on Treatment Plan : BRAIN GLIOBLASTOMA Consolidation Temozolomide Days 1-5 q28 Days        Biomarkers:  MGMT Methylated.  IDH 1/2 Wild type.  EGFR Unknown  TERT Unknown   Interval History:  Hannah [name] presents today for avastin infusion, now having completed first full cycle of metronomic Temodar.  No significant changes from prior visit.  She is still participating minimally in coversations, activities of daily living, fully dependent on husband and family all ALDs.  Has taken full 8063mose of Temodar most days.  Baclofen has helped with spasticity but she takes half of the 13m22mll.  Continues on Vimpat to 100mg16mmg.    Decadron 11/09/20: 2mg 034m0/22: 8mg 0239m/22: 6mg 03/26m22: 4mg 03/29m2: 8mg 04/1140m: 6mg 05/12/37m 3mg 05/23/285m2mg 06/06/2222m.5mg 04/23/21:75m H+P (01/20/20) Patient presented one month ago with sudden  onset left arm and leg weakness and interrupted speech, [name], [name], stroke eval and CNS imaging which demonstrated a non-enhancing left frontal mass.  At this time she is back to baseline without recurrence of events, taking Keppra 500mg twice per38m.  No history or seizure or any other neurologic events.  She does describe being sleep deprived prior to seizure event.  She presents today after one month follow up MRI scan.  Medications: Current Outpatient Medications on File Prior to Visit  Medication Sig Dispense Refill   apixaban (ELIQUIS) 5 MG TABS tablet TAKE 1 TABLET (5 MG TOTAL) BY MOUTH 2 (TWO) TIMES DAILY. 60 tablet 3   Baclofen 5 MG TABS Take 5 mg by mouth 3 (three) times daily. 90 tablet 3   cholecalciferol (VITAMIN D3) 25 MCG (1000 UNIT) tablet Take 1,000 Units by mouth daily.     ciprofloxacin (CIPRO) 500 MG tablet Take 1 tablet (500 mg total) by mouth 2 (two) times daily. (Patient not taking: No sig reported) 28 tablet 0   docusate sodium (COLACE) 100 MG capsule Take 100 mg by mouth 2 (two) times daily as needed for mild constipation.     Lacosamide 100 MG TABS Take 1 tablet by mouth in the morning (100mg) and 2 tab11m by mouth at bedtime (200mg) 90 tablet 41mmethylphenidate (RITALIN) 5 MG tablet Take 1 tablet (5 mg total) by mouth 2 (two)  times daily. (Patient not taking: No sig reported) 60 tablet 0   ondansetron (ZOFRAN) 8 MG tablet Take 1 tablet (8 mg total) by mouth 2 (two) times daily as needed (nausea and vomiting). May take 30-60 minutes prior to Temodar administration if nausea/vomiting occurs. 30 tablet 1   temozolomide (TEMODAR) 20 MG capsule Take 4 capsules (80 mg total) by mouth daily. May take on an empty stomach to decrease nausea & vomiting. 120 capsule 0   [DISCONTINUED] lamoTRIgine (LAMICTAL) 100 MG tablet Take 1 tablet (100 mg total) by  mouth daily. 30 tablet 3   No current facility-administered medications on file prior to visit.    Allergies: No Known Allergies Past Medical History:  Past Medical History:  Diagnosis Date   Cervical spondylolysis 12/15/2019   Skeleton: Mild cervical spondylosis C6-7. Incidental find.    Colon polyp    GBM (glioblastoma multiforme) ([facility])    Left ankle sprain 10/07/2011   Seizure ([facility])    partial    Tick bite 02/2020   Past Surgical History:  Past Surgical History:  Procedure Laterality Date   APPLICATION OF CRANIAL NAVIGATION Left 02/16/2020   Procedure: APPLICATION OF CRANIAL NAVIGATION;  Surgeon: Vallarie Mare, MD;  Location: [facility];  Service: Neurosurgery;  Laterality: Left;   CRANIOTOMY Left 02/16/2020   Procedure: LEFT FRONTAL CRANIOTOMY FOR BRAIN TUMOR;  Surgeon: Vallarie Mare, MD;  Location: [facility];  Service: Neurosurgery;  Laterality: Left;   FOOT SURGERY Left    bone spur - local anesthesia   IR ANGIOGRAM PULMONARY BILATERAL SELECTIVE  12/15/2020   IR ANGIOGRAM SELECTIVE EACH ADDITIONAL VESSEL  12/15/2020   IR ANGIOGRAM SELECTIVE EACH ADDITIONAL VESSEL  12/15/2020   IR IVC FILTER PLMT / S&I /IMG GUID/MOD SED  12/15/2020   IR THROMBECT PRIM MECH INIT (INCLU) MOD SED  12/15/2020   IR THROMBECT PRIM MECH INIT (INCLU) MOD SED  12/15/2020   RADIOLOGY WITH ANESTHESIA N/A 12/15/2020   Procedure: IR WITH ANESTHESIA;  Surgeon: Radiologist, Medication, MD;  Location: [facility];  Service: Radiology;  Laterality: N/A;   TONSILECTOMY/ADENOIDECTOMY WITH MYRINGOTOMY     tonsils and adenoids out only   WISDOM TOOTH EXTRACTION     Social History:  Social History   Socioeconomic History   Marital status: Married    Spouse name: Not on file   Number of children: 3   Years of education: Not on file   Highest education level: Not on file  Occupational History   Not on file  Tobacco Use   Smoking status: Former    Years: 2.00    Types: Cigarettes    Quit date: 11/05/1979    Years  since quitting: 41.8   Smokeless tobacco: Never   Tobacco comments:    1/2 pack a week  Vaping Use   Vaping Use: Never used  Substance and Sexual Activity   Alcohol use: Yes    Alcohol/week: 0.0 standard drinks    Comment: 1 glass of wine weekly   Drug use: No   Sexual activity: Yes    Birth control/protection: Surgical  Other Topics Concern   Not on file  Social History Narrative   Not on file   Social Determinants of Health   Financial Resource Strain: Not on file  Food Insecurity: Not on file  Transportation Needs: Not on file  Physical Activity: Not on file  Stress: Not on file  Social Connections: Not on file  Intimate Partner Violence: Not on file  Family History:  Family History  Problem Relation Age of Onset   Hypertension Mother    Stroke Mother    Diabetes Mother    Colon cancer Father 70   Hypertension Sister    Colon polyps Sister    Heart disease Brother 16   Diabetes Maternal Uncle    Diabetes Maternal Uncle     Review of Systems: Constitutional: Doesn't report fevers, chills or abnormal weight loss Eyes: Doesn't report blurriness of vision Ears, nose, mouth, throat, and face: Doesn't report sore throat Respiratory: Chest wall pain Cardiovascular: Doesn't report palpitation, chest discomfort  Gastrointestinal:  Doesn't report nausea, constipation, diarrhea GU: Doesn't report incontinence Skin: Doesn't report skin rashes Neurological: Per HPI Musculoskeletal: R shoulder pain, limited ROM Behavioral/Psych: Doesn't report anxiety  Physical Exam: Vitals:   09/10/21 1033  BP: (!) 148/106  Pulse: 73  Resp: 18  Temp: (!) 97.5 F (36.4 C)  SpO2: 100%     KPS: 60. General: Alert, cooperative, pleasant, in no acute distress Head: Normal EENT: No conjunctival injection or scleral icterus.  Lungs: Resp effort normal Cardiac: Regular rate Abdomen: Non-distended abdomen Skin: No rashes cyanosis or petechiae. Extremities: No clubbing or  edema  Neurologic Exam: Mental Status: Awake, alert, attentive to examiner. Both receptive and expressive dysphasia.  Cranial Nerves: Visual acuity is grossly normal. Visual fields are full. Extra-ocular movements intact. No ptosis. Face is symmetric Motor: Tone and bulk are normal. Right leg 3/5, left leg 4/5, proximal greater than distal weakness.  Noted R leg flexor spasticity. Reflexes are symmetric, no pathologic reflexes present.  Sensory: Intact to light touch Gait: Deferred today  Labs: I have reviewed the data as listed    Component Value Date/Time   NA 138 08/27/2021 0921   K 3.6 08/27/2021 0921   CL 105 08/27/2021 0921   CO2 25 08/27/2021 0921   GLUCOSE 106 (H) 08/27/2021 0921   GLUCOSE 85 10/13/2006 1158   BUN 16 08/27/2021 0921   CREATININE 0.62 08/27/2021 0921   CALCIUM 9.4 08/27/2021 0921   PROT 6.9 08/27/2021 0921   ALBUMIN 3.9 08/27/2021 0921   AST 19 08/27/2021 0921   ALT 11 08/27/2021 0921   ALKPHOS 42 08/27/2021 0921   BILITOT 0.7 08/27/2021 0921   GFRNONAA >60 08/27/2021 0921   GFRAA >60 02/17/2020 0136   Lab Results  Component Value Date   WBC 4.0 08/27/2021   NEUTROABS 3.0 08/27/2021   HGB 15.5 (H) 08/27/2021   HCT 45.2 08/27/2021   MCV 94.4 08/27/2021   PLT 177 08/27/2021    Assessment/Plan No diagnosis found.  Hannah [name] is clinically stable today, now having completed first cycle of metronomic Temodar.  Labs are within normal limits.  We recommended continuing treatment with cycle #2 daily metronomic Temozolomide 50 mg/m2.  The patient will have a complete blood count performed on days 21 and 28 of each cycle, and a comprehensive metabolic panel performed on day 28 of each cycle. Labs may need to be performed more often. Zofran will prescribed for home use for nausea/vomiting.   Chemotherapy should be held for the following:  ANC less than 1,000  Platelets less than 100,000  LFT or creatinine greater than 2x ULN  If clinical  concerns/contraindications develop  In meantime, will continue treatment with Avastin 3m/kg IV q2 weeks.  Avastin will help treat inflammatory processes in the brain, and act as steroid sparing agent.  Avastin should be held for the following:  ANC less than 500  Platelets less than 50,000  LFT or creatinine greater than 2x ULN  If clinical concerns/contraindications develop   For elevated blood pressure today, will start therapy with Amlodipine 7m daily.  This is likely side effect of Avastin.  Will con't Vimpat 200/100, Eliquis as prior.   Baclofen con't [id number] pRN.  We ask that Hannah [name] return to clinic in 2 weeks for next avastin infusion, or sooner if needed.  Next MRI should be in 1 month prior to cycle #3.  All questions were answered. The patient knows to call the clinic with any problems, questions or concerns. No barriers to learning were detected.  I have spent a total of 30 minutes of face-to-face and non-face-to-face time, excluding clinical staff time, preparing to see patient, ordering tests and/or medications, counseling the patient, and independently interpreting results and communicating results to the patient/family/caregiver    Hannah [name] Medical Director of Neuro-Oncology [facility]1/07/22 9:30 AM

## 2021-09-10 NOTE — Patient Instructions (Signed)
[facility] MEDICAL ONCOLOGY  Discharge Instructions: Thank you for choosing [facility] to provide your oncology and hematology care.   If you have a lab appointment with the Cancer Center, please go directly to the Cancer Center and check in at the registration area.   Wear comfortable clothing and clothing appropriate for easy access to any Portacath or PICC line.   We strive to give you quality time with your provider. You may need to reschedule your appointment if you arrive late (15 or more minutes).  Arriving late affects you and other patients whose appointments are after yours.  Also, if you miss three or more appointments without notifying the office, you may be dismissed from the clinic at the provider's discretion.      For prescription refill requests, have your pharmacy contact our office and allow 72 hours for refills to be completed.    Today you received the following chemotherapy and/or immunotherapy agents [id number]   To help prevent nausea and vomiting after your treatment, we encourage you to take your nausea medication as directed.  BELOW ARE SYMPTOMS THAT SHOULD BE REPORTED IMMEDIATELY: . *FEVER GREATER THAN 100.4 F (38 C) OR HIGHER . *CHILLS OR SWEATING . *NAUSEA AND VOMITING THAT IS NOT CONTROLLED WITH YOUR NAUSEA MEDICATION . *UNUSUAL SHORTNESS OF BREATH . *UNUSUAL BRUISING OR BLEEDING . *URINARY PROBLEMS (pain or burning when urinating, or frequent urination) . *BOWEL PROBLEMS (unusual diarrhea, constipation, pain near the anus) . TENDERNESS IN MOUTH AND THROAT WITH OR WITHOUT PRESENCE OF ULCERS (sore throat, sores in mouth, or a toothache) . UNUSUAL RASH, SWELLING OR PAIN  . UNUSUAL VAGINAL DISCHARGE OR ITCHING   Items with * indicate a potential emergency and should be followed up as soon as possible or go to the Emergency Department if any problems should occur.  Please show the CHEMOTHERAPY ALERT CARD or IMMUNOTHERAPY  ALERT CARD at check-in to the Emergency Department and triage nurse.  Should you have questions after your visit or need to cancel or reschedule your appointment, please contact [facility]  Dept: [phone]  and follow the prompts.  Office hours are 8:00 a.m. to 4:30 p.m. Monday - Friday. Please note that voicemails left after 4:00 p.m. may not be returned until the following business day.  We are closed weekends and major holidays. You have access to a nurse at all times for urgent questions. Please call the main number to the clinic Dept: [phone] and follow the prompts.   For any non-urgent questions, you may also contact your provider using MyChart. We now offer e-Visits for anyone 18 and older to request care online for non-urgent symptoms. For details visit [email or web address].   Also download the MyChart app! Go to the app store, search "MyChart", open the app, select [facility], and log in with your MyChart username and password.  Due to Covid, a mask is required upon entering the hospital/clinic. If you do not have a mask, one will be given to you upon arrival. For doctor visits, patients may have 1 support person aged 18 or older with them. For treatment visits, patients cannot have anyone with them due to current Covid guidelines and our immunocompromised population.   

## 2021-09-10 NOTE — Telephone Encounter (Signed)
Scheduled per 11/7 los, pt has been called and husband confirmed appt

## 2021-09-12 ENCOUNTER — Other Ambulatory Visit (HOSPITAL_BASED_OUTPATIENT_CLINIC_OR_DEPARTMENT_OTHER): Payer: Self-pay

## 2021-09-13 ENCOUNTER — Other Ambulatory Visit (HOSPITAL_BASED_OUTPATIENT_CLINIC_OR_DEPARTMENT_OTHER): Payer: Self-pay

## 2021-09-17 ENCOUNTER — Other Ambulatory Visit (HOSPITAL_COMMUNITY): Payer: Self-pay

## 2021-09-20 ENCOUNTER — Other Ambulatory Visit (HOSPITAL_COMMUNITY): Payer: Self-pay

## 2021-09-20 ENCOUNTER — Other Ambulatory Visit (HOSPITAL_BASED_OUTPATIENT_CLINIC_OR_DEPARTMENT_OTHER): Payer: Self-pay

## 2021-09-21 ENCOUNTER — Other Ambulatory Visit (HOSPITAL_BASED_OUTPATIENT_CLINIC_OR_DEPARTMENT_OTHER): Payer: Self-pay

## 2021-09-24 ENCOUNTER — Other Ambulatory Visit (HOSPITAL_COMMUNITY): Payer: Self-pay

## 2021-09-24 ENCOUNTER — Other Ambulatory Visit (HOSPITAL_BASED_OUTPATIENT_CLINIC_OR_DEPARTMENT_OTHER): Payer: Self-pay

## 2021-09-24 ENCOUNTER — Other Ambulatory Visit: Payer: Self-pay | Admitting: Radiation Therapy

## 2021-09-25 ENCOUNTER — Other Ambulatory Visit: Payer: Self-pay

## 2021-09-25 ENCOUNTER — Inpatient Hospital Stay (HOSPITAL_BASED_OUTPATIENT_CLINIC_OR_DEPARTMENT_OTHER): Payer: 59 | Admitting: Internal Medicine

## 2021-09-25 ENCOUNTER — Inpatient Hospital Stay: Payer: 59

## 2021-09-25 VITALS — BP 120/84 | HR 94 | Temp 97.8°F | Resp 17 | Ht 65.0 in | Wt 125.6 lb

## 2021-09-25 DIAGNOSIS — Z79899 Other long term (current) drug therapy: Secondary | ICD-10-CM | POA: Diagnosis not present

## 2021-09-25 DIAGNOSIS — Z86711 Personal history of pulmonary embolism: Secondary | ICD-10-CM | POA: Diagnosis not present

## 2021-09-25 DIAGNOSIS — Z9221 Personal history of antineoplastic chemotherapy: Secondary | ICD-10-CM | POA: Diagnosis not present

## 2021-09-25 DIAGNOSIS — G40909 Epilepsy, unspecified, not intractable, without status epilepticus: Secondary | ICD-10-CM | POA: Diagnosis not present

## 2021-09-25 DIAGNOSIS — Z923 Personal history of irradiation: Secondary | ICD-10-CM | POA: Diagnosis not present

## 2021-09-25 DIAGNOSIS — C719 Malignant neoplasm of brain, unspecified: Secondary | ICD-10-CM | POA: Diagnosis not present

## 2021-09-25 DIAGNOSIS — Z7901 Long term (current) use of anticoagulants: Secondary | ICD-10-CM | POA: Diagnosis not present

## 2021-09-25 DIAGNOSIS — Z5112 Encounter for antineoplastic immunotherapy: Secondary | ICD-10-CM | POA: Diagnosis not present

## 2021-09-25 DIAGNOSIS — C711 Malignant neoplasm of frontal lobe: Secondary | ICD-10-CM | POA: Diagnosis not present

## 2021-09-25 LAB — CMP (CANCER CENTER ONLY)
ALT: 25 U/L (ref 0–44)
AST: 21 U/L (ref 15–41)
Albumin: 3.9 g/dL (ref 3.5–5.0)
Alkaline Phosphatase: 59 U/L (ref 38–126)
Anion gap: 11 (ref 5–15)
BUN: 17 mg/dL (ref 6–20)
CO2: 22 mmol/L (ref 22–32)
Calcium: 9.5 mg/dL (ref 8.9–10.3)
Chloride: 107 mmol/L (ref 98–111)
Creatinine: 0.77 mg/dL (ref 0.44–1.00)
GFR, Estimated: >60 mL/min (ref >60)
Glucose, Bld: 140 mg/dL — ABNORMAL HIGH (ref 70–99)
Potassium: 3.5 mmol/L (ref 3.5–5.1)
Sodium: 140 mmol/L (ref 135–145)
Total Bilirubin: 0.5 mg/dL (ref 0.3–1.2)
Total Protein: 6.8 g/dL (ref 6.5–8.1)

## 2021-09-25 LAB — CBC WITH DIFFERENTIAL (CANCER CENTER ONLY)
Abs Immature Granulocytes: 0.01 10*3/uL (ref 0.00–0.07)
Basophils Absolute: 0 10*3/uL (ref 0.0–0.1)
Basophils Relative: 0 %
Eosinophils Absolute: 0.1 10*3/uL (ref 0.0–0.5)
Eosinophils Relative: 2 %
HCT: 44.4 % (ref 36.0–46.0)
Hemoglobin: 15 g/dL (ref 12.0–15.0)
Immature Granulocytes: 0 %
Lymphocytes Relative: 16 %
Lymphs Abs: 0.7 10*3/uL (ref 0.7–4.0)
MCH: 32.8 pg (ref 26.0–34.0)
MCHC: 33.8 g/dL (ref 30.0–36.0)
MCV: 97.2 fL (ref 80.0–100.0)
Monocytes Absolute: 0.4 10*3/uL (ref 0.1–1.0)
Monocytes Relative: 10 %
Neutro Abs: 3.1 10*3/uL (ref 1.7–7.7)
Neutrophils Relative %: 72 %
Platelet Count: 196 10*3/uL (ref 150–400)
RBC: 4.57 MIL/uL (ref 3.87–5.11)
RDW: 12.6 % (ref 11.5–15.5)
WBC Count: 4.3 10*3/uL (ref 4.0–10.5)
nRBC: 0 % (ref 0.0–0.2)

## 2021-09-25 LAB — TOTAL PROTEIN, URINE DIPSTICK: Protein, ur: NEGATIVE mg/dL

## 2021-09-25 MED ORDER — SODIUM CHLORIDE 0.9 % IV SOLN
600.0000 mg | Freq: Once | INTRAVENOUS | Status: AC
  Administered 2021-09-25: 600 mg via INTRAVENOUS
  Filled 2021-09-25: qty 16

## 2021-09-25 MED ORDER — SODIUM CHLORIDE 0.9 % IV SOLN: Freq: Once | INTRAVENOUS | Status: AC

## 2021-09-25 NOTE — Progress Notes (Signed)
[facility] [place], [place] [address] [phone]   Interval Evaluation  Date of Service: 09/25/21 Patient Name: Hannah [name] Patient MRN: [id number] Patient DOB: Aug 30, 1961 Provider: [name], MD  Identifying Statement:  Hannah [name] is a 60 y.o. female with left frontal glioblastoma   Oncologic History: Oncology History  GBM (glioblastoma multiforme) ([facility])   Initial Diagnosis   GBM (glioblastoma multiforme) ([facility])   02/17/2020 Surgery   Craniotomy, left frontal resection by Dr. Marcello Moores.  Path is glioblastoma.   08/28/2020 - 10/09/2020 Radiation Therapy   Radiation and concurrent Temozolomide 18m/m2 daily   11/10/2020 - 12/09/2020 Chemotherapy   Completes 1 cycle of 5-day Temozolomide      [facility]   Ad[facility] for [facility], undergoes thrombectomy and IVC filter placement   03/09/2021 Progression      03/27/2021 -  Chemotherapy   Initiates second line therapy with Avastin 165mkg IV q2 weeks    08/08/2021 -  Chemotherapy   Patient is on Treatment Plan : BRAIN GLIOBLASTOMA Consolidation Temozolomide Days 1-5 q28 Days        Biomarkers:  MGMT Methylated.  IDH 1/2 Wild type.  EGFR Unknown  TERT Unknown   Interval History:  Steven A Summons presents today for avastin infusion, now in midst of second month of metronomic Temodar.  No significant changes from prior visit.  She is still participating minimally in coversations, activities of daily living, fully dependent on husband and family all ALDs.  Has taken full 8058mose of Temodar most days.  Baclofen has helped with spasticity but she takes half of the 66m53mll.  Continues on Vimpat to 100mg52mmg.    Decadron 11/09/20: 2mg 041m0/22: 8mg 0222m/22: 6mg 03/90m22: 4mg 03/266m2: 8mg 04/1114m: 6mg 05/12/55m 3mg 05/23/273m2mg 06/06/2214m.5mg 04/23/21:30m H+P (01/20/20) Patient presented one month ago with sudden onset  left arm and leg weakness and interrupted speech, [name], [name], stroke eval and CNS imaging which demonstrated a non-enhancing left frontal mass.  At this time she is back to baseline without recurrence of events, taking Keppra 500mg twice per17m.  No history or seizure or any other neurologic events.  She does describe being sleep deprived prior to seizure event.  She presents today after one month follow up MRI scan.  Medications: Current Outpatient Medications on File Prior to Visit  Medication Sig Dispense Refill   amLODipine (NORVASC) 5 MG tablet Take 1 tablet (5 mg total) by mouth daily. 30 tablet 3   apixaban (ELIQUIS) 5 MG TABS tablet TAKE 1 TABLET (5 MG TOTAL) BY MOUTH 2 (TWO) TIMES DAILY. 60 tablet 3   Baclofen 5 MG TABS Take 5 mg by mouth 3 (three) times daily. 90 tablet 3   cholecalciferol (VITAMIN D3) 25 MCG (1000 UNIT) tablet Take 1,000 Units by mouth daily.     ciprofloxacin (CIPRO) 500 MG tablet Take 1 tablet (500 mg total) by mouth 2 (two) times daily. (Patient not taking: No sig reported) 28 tablet 0   docusate sodium (COLACE) 100 MG capsule Take 100 mg by mouth 2 (two) times daily as needed for mild constipation.     Lacosamide 100 MG TABS Take 1 tablet by mouth in the morning (100mg) and 2 tab19m by mouth at bedtime (200mg) 9076m  tablet 3   methylphenidate (RITALIN) 5 MG tablet Take 1 tablet (5 mg total) by mouth 2 (two) times daily. (Patient not taking: No sig reported) 60 tablet 0   ondansetron (ZOFRAN) 8 MG tablet Take 1 tablet (8 mg total) by mouth 2 (two) times daily as needed (nausea and vomiting). May take 30-60 minutes prior to Temodar administration if nausea/vomiting occurs. 30 tablet 1   temozolomide (TEMODAR) 20 MG capsule Take 4 capsules (80 mg total) by mouth daily. May take on an empty stomach to decrease nausea & vomiting. 120 capsule 0    [DISCONTINUED] lamoTRIgine (LAMICTAL) 100 MG tablet Take 1 tablet (100 mg total) by mouth daily. 30 tablet 3   No current facility-administered medications on file prior to visit.    Allergies: No Known Allergies Past Medical History:  Past Medical History:  Diagnosis Date   Cervical spondylolysis 12/15/2019   Skeleton: Mild cervical spondylosis C6-7. Incidental find.    Colon polyp    GBM (glioblastoma multiforme) ([facility])    Left ankle sprain 10/07/2011   Seizure ([facility])    partial    Tick bite 02/2020   Past Surgical History:  Past Surgical History:  Procedure Laterality Date   APPLICATION OF CRANIAL NAVIGATION Left 02/16/2020   Procedure: APPLICATION OF CRANIAL NAVIGATION;  Surgeon: Vallarie Mare, MD;  Location: [facility];  Service: Neurosurgery;  Laterality: Left;   CRANIOTOMY Left 02/16/2020   Procedure: LEFT FRONTAL CRANIOTOMY FOR BRAIN TUMOR;  Surgeon: Vallarie Mare, MD;  Location: [facility];  Service: Neurosurgery;  Laterality: Left;   FOOT SURGERY Left    bone spur - local anesthesia   IR ANGIOGRAM PULMONARY BILATERAL SELECTIVE  12/15/2020   IR ANGIOGRAM SELECTIVE EACH ADDITIONAL VESSEL  12/15/2020   IR ANGIOGRAM SELECTIVE EACH ADDITIONAL VESSEL  12/15/2020   IR IVC FILTER PLMT / S&I /IMG GUID/MOD SED  12/15/2020   IR THROMBECT PRIM MECH INIT (INCLU) MOD SED  12/15/2020   IR THROMBECT PRIM MECH INIT (INCLU) MOD SED  12/15/2020   RADIOLOGY WITH ANESTHESIA N/A 12/15/2020   Procedure: IR WITH ANESTHESIA;  Surgeon: Radiologist, Medication, MD;  Location: [facility];  Service: Radiology;  Laterality: N/A;   TONSILECTOMY/ADENOIDECTOMY WITH MYRINGOTOMY     tonsils and adenoids out only   WISDOM TOOTH EXTRACTION     Social History:  Social History   Socioeconomic History   Marital status: Married    Spouse name: Not on file   Number of children: 3   Years of education: Not on file   Highest education level: Not on file  Occupational History   Not on file  Tobacco Use   Smoking  status: Former    Years: 2.00    Types: Cigarettes    Quit date: 11/05/1979    Years since quitting: 41.9   Smokeless tobacco: Never   Tobacco comments:    1/2 pack a week  Vaping Use   Vaping Use: Never used  Substance and Sexual Activity   Alcohol use: Yes    Alcohol/week: 0.0 standard drinks    Comment: 1 glass of wine weekly   Drug use: No   Sexual activity: Yes    Birth control/protection: Surgical  Other Topics Concern   Not on file  Social History Narrative   Not on file   Social Determinants of Health   Financial Resource Strain: Not on file  Food Insecurity: Not on file  Transportation Needs: Not on file  Physical Activity: Not on file  Stress: Not on file  Social Connections: Not on file  Intimate Partner Violence: Not on file   Family History:  Family History  Problem Relation Age of Onset   Hypertension Mother    Stroke Mother    Diabetes Mother    Colon cancer Father 17   Hypertension Sister    Colon polyps Sister    Heart disease Brother 60   Diabetes Maternal Uncle    Diabetes Maternal Uncle     Review of Systems: Constitutional: Doesn't report fevers, chills or abnormal weight loss Eyes: Doesn't report blurriness of vision Ears, nose, mouth, throat, and face: Doesn't report sore throat Respiratory: Chest wall pain Cardiovascular: Doesn't report palpitation, chest discomfort  Gastrointestinal:  Doesn't report nausea, constipation, diarrhea GU: Doesn't report incontinence Skin: Doesn't report skin rashes Neurological: Per HPI Musculoskeletal: R shoulder pain, limited ROM Behavioral/Psych: Doesn't report anxiety  Physical Exam: Vitals:   09/25/21 0915  BP: 120/84  Pulse: 94  Resp: 17  Temp: 97.8 F (36.6 C)  SpO2: 98%    KPS: 60. General: Alert, cooperative, pleasant, in no acute distress Head: Normal EENT: No conjunctival injection or scleral icterus.  Lungs: Resp effort normal Cardiac: Regular rate Abdomen: Non-distended  abdomen Skin: No rashes cyanosis or petechiae. Extremities: No clubbing or edema  Neurologic Exam: Mental Status: Awake, alert, attentive to examiner. Both receptive and expressive dysphasia.  Cranial Nerves: Visual acuity is grossly normal. Visual fields are full. Extra-ocular movements intact. No ptosis. Face is symmetric Motor: Tone and bulk are normal. Right leg 3/5, left leg 4/5, proximal greater than distal weakness.  Noted R leg flexor spasticity. Reflexes are symmetric, no pathologic reflexes present.  Sensory: Intact to light touch Gait: Deferred today  Labs: I have reviewed the data as listed    Component Value Date/Time   NA 141 09/10/2021 0948   K 3.6 09/10/2021 0948   CL 106 09/10/2021 0948   CO2 25 09/10/2021 0948   GLUCOSE 100 (H) 09/10/2021 0948   GLUCOSE 85 10/13/2006 1158   BUN 17 09/10/2021 0948   CREATININE 0.76 09/10/2021 0948   CALCIUM 9.4 09/10/2021 0948   PROT 6.7 09/10/2021 0948   ALBUMIN 3.7 09/10/2021 0948   AST 16 09/10/2021 0948   ALT 13 09/10/2021 0948   ALKPHOS 54 09/10/2021 0948   BILITOT 0.4 09/10/2021 0948   GFRNONAA >60 09/10/2021 0948   GFRAA >60 02/17/2020 0136   Lab Results  Component Value Date   WBC 4.3 09/25/2021   NEUTROABS 3.1 09/25/2021   HGB 15.0 09/25/2021   HCT 44.4 09/25/2021   MCV 97.2 09/25/2021   PLT 196 09/25/2021    Assessment/Plan GBM (glioblastoma multiforme) ([facility])  Seizure disorder ([facility])  Margerite A Henrickson is clinically stable today, now halfway through cycle #2 of metronomic Temodar.  No progressive changes.  We recommended continuing treatment with cycle #2 daily metronomic Temozolomide 50 mg/m2.  The patient will have a complete blood count performed on days 21 and 28 of each cycle, and a comprehensive metabolic panel performed on day 28 of each cycle. Labs may need to be performed more often. Zofran will prescribed for home use for nausea/vomiting.   Chemotherapy should be held for the following:  ANC  less than 1,000  Platelets less than 100,000  LFT or creatinine greater than 2x ULN  If clinical concerns/contraindications develop  In meantime, will continue treatment with Avastin 18m/kg IV q2 weeks.  Avastin will help treat inflammatory processes in the brain, and act  as steroid sparing agent.  Avastin should be held for the following:  ANC less than 500  Platelets less than 50,000  LFT or creatinine greater than 2x ULN  If clinical concerns/contraindications develop   Will con't Norvasc 100m daily.  Will con't Vimpat 200/100, Eliquis as prior.   Baclofen con't [id number] pRN.  We ask that Mozella A Vowell return to clinic in 2 weeks following brain MRI, or sooner if needed.    All questions were answered. The patient knows to call the clinic with any problems, questions or concerns. No barriers to learning were detected.  I have spent a total of 30 minutes of face-to-face and non-face-to-face time, excluding clinical staff time, preparing to see patient, ordering tests and/or medications, counseling the patient, and independently interpreting results and communicating results to the patient/family/caregiver    ZaVentura SellersMD Medical Director of Neuro-Oncology [facility] WeIndian [facility] Shores1/22/22 9:06 AM

## 2021-09-25 NOTE — Patient Instructions (Signed)
[facility]  Discharge Instructions: Thank you for choosing [facility] to provide your oncology and hematology care.   If you have a lab appointment with the [facility], please go directly to the [facility] and check in at the registration area.   Wear comfortable clothing and clothing appropriate for easy access to any Portacath or PICC line.   We strive to give you quality time with your provider. You may need to reschedule your appointment if you arrive late (15 or more minutes).  Arriving late affects you and other patients whose appointments are after yours.  Also, if you miss three or more appointments without notifying the office, you may be dismissed from the clinic at the provider's discretion.      For prescription refill requests, have your pharmacy contact our office and allow 72 hours for refills to be completed.    Today you received the following chemotherapy and/or immunotherapy agents: MVASI    To help prevent nausea and vomiting after your treatment, we encourage you to take your nausea medication as directed.  BELOW ARE SYMPTOMS THAT SHOULD BE REPORTED IMMEDIATELY: *FEVER GREATER THAN 100.4 F (38 C) OR HIGHER *CHILLS OR SWEATING *NAUSEA AND VOMITING THAT IS NOT CONTROLLED WITH YOUR NAUSEA MEDICATION *UNUSUAL SHORTNESS OF BREATH *UNUSUAL BRUISING OR BLEEDING *URINARY PROBLEMS (pain or burning when urinating, or frequent urination) *BOWEL PROBLEMS (unusual diarrhea, constipation, pain near the anus) TENDERNESS IN MOUTH AND THROAT WITH OR WITHOUT PRESENCE OF ULCERS (sore throat, sores in mouth, or a toothache) UNUSUAL RASH, SWELLING OR PAIN  UNUSUAL VAGINAL DISCHARGE OR ITCHING   Items with * indicate a potential emergency and should be followed up as soon as possible or go to the Emergency Department if any problems should occur.  Please show the CHEMOTHERAPY ALERT CARD or IMMUNOTHERAPY ALERT CARD at check-in to the  Emergency Department and triage nurse.  Should you have questions after your visit or need to cancel or reschedule your appointment, please contact [facility]  Dept: [phone]  and follow the prompts.  Office hours are 8:00 a.m. to 4:30 p.m. Monday - Friday. Please note that voicemails left after 4:00 p.m. may not be returned until the following business day.  We are closed weekends and major holidays. You have access to a nurse at all times for urgent questions. Please call the main number to the clinic Dept: [phone] and follow the prompts.   For any non-urgent questions, you may also contact your provider using MyChart. We now offer e-Visits for anyone 35 and older to request care online for non-urgent symptoms. For details visit [email or web address].   Also download the MyChart app! Go to the app store, search "MyChart", open the app, select [facility], and log in with your MyChart username and password.  Due to Covid, a mask is required upon entering the hospital/clinic. If you do not have a mask, one will be given to you upon arrival. For doctor visits, patients may have 1 support person aged 7 or older with them. For treatment visits, patients cannot have anyone with them due to current Covid guidelines and our immunocompromised population.

## 2021-09-25 NOTE — Progress Notes (Signed)
Daughter at chairside w/ pt in infusion today

## 2021-09-26 ENCOUNTER — Telehealth: Payer: Self-pay | Admitting: Internal Medicine

## 2021-09-26 NOTE — Telephone Encounter (Signed)
Scheduled per 11/22 los, message was left with pt

## 2021-10-02 ENCOUNTER — Other Ambulatory Visit (HOSPITAL_BASED_OUTPATIENT_CLINIC_OR_DEPARTMENT_OTHER): Payer: Self-pay

## 2021-10-03 ENCOUNTER — Ambulatory Visit (HOSPITAL_COMMUNITY)
Admission: RE | Admit: 2021-10-03 | Discharge: 2021-10-03 | Disposition: A | Discharge status: Home / Self Care | Payer: 59 | Source: Ambulatory Visit | Attending: Internal Medicine | Admitting: Internal Medicine

## 2021-10-03 ENCOUNTER — Other Ambulatory Visit: Payer: Self-pay

## 2021-10-03 ENCOUNTER — Other Ambulatory Visit (HOSPITAL_BASED_OUTPATIENT_CLINIC_OR_DEPARTMENT_OTHER): Payer: Self-pay

## 2021-10-03 DIAGNOSIS — C719 Malignant neoplasm of brain, unspecified: Secondary | ICD-10-CM | POA: Insufficient documentation

## 2021-10-03 DIAGNOSIS — Q040 Congenital malformations of corpus callosum: Secondary | ICD-10-CM | POA: Diagnosis not present

## 2021-10-03 DIAGNOSIS — C729 Malignant neoplasm of central nervous system, unspecified: Secondary | ICD-10-CM | POA: Diagnosis not present

## 2021-10-03 DIAGNOSIS — R220 Localized swelling, mass and lump, head: Secondary | ICD-10-CM | POA: Diagnosis not present

## 2021-10-03 MED ORDER — GADOBUTROL 1 MMOL/ML IV SOLN
6.0000 mL | Freq: Once | INTRAVENOUS | Status: AC | PRN
  Administered 2021-10-03: 6 mL via INTRAVENOUS

## 2021-10-04 ENCOUNTER — Encounter: Payer: Self-pay | Admitting: Internal Medicine

## 2021-10-08 ENCOUNTER — Encounter: Payer: Self-pay | Admitting: Internal Medicine

## 2021-10-08 ENCOUNTER — Other Ambulatory Visit (HOSPITAL_BASED_OUTPATIENT_CLINIC_OR_DEPARTMENT_OTHER): Payer: Self-pay

## 2021-10-11 ENCOUNTER — Other Ambulatory Visit: Payer: Self-pay

## 2021-10-11 ENCOUNTER — Inpatient Hospital Stay: Payer: 59 | Admitting: Internal Medicine

## 2021-10-11 ENCOUNTER — Inpatient Hospital Stay: Payer: 59 | Attending: Internal Medicine

## 2021-10-11 ENCOUNTER — Other Ambulatory Visit (HOSPITAL_BASED_OUTPATIENT_CLINIC_OR_DEPARTMENT_OTHER): Payer: Self-pay

## 2021-10-11 ENCOUNTER — Inpatient Hospital Stay: Payer: 59

## 2021-10-11 ENCOUNTER — Other Ambulatory Visit (HOSPITAL_COMMUNITY): Payer: Self-pay

## 2021-10-11 VITALS — BP 123/70 | HR 84 | Temp 98.1°F | Resp 18 | Wt 136.0 lb

## 2021-10-11 DIAGNOSIS — G40909 Epilepsy, unspecified, not intractable, without status epilepticus: Secondary | ICD-10-CM | POA: Insufficient documentation

## 2021-10-11 DIAGNOSIS — C711 Malignant neoplasm of frontal lobe: Secondary | ICD-10-CM | POA: Insufficient documentation

## 2021-10-11 DIAGNOSIS — Z79630 Long term (current) use of alkylating agent: Secondary | ICD-10-CM | POA: Diagnosis not present

## 2021-10-11 DIAGNOSIS — Z7901 Long term (current) use of anticoagulants: Secondary | ICD-10-CM | POA: Diagnosis not present

## 2021-10-11 DIAGNOSIS — Z9221 Personal history of antineoplastic chemotherapy: Secondary | ICD-10-CM | POA: Insufficient documentation

## 2021-10-11 DIAGNOSIS — C719 Malignant neoplasm of brain, unspecified: Secondary | ICD-10-CM

## 2021-10-11 DIAGNOSIS — Z79899 Other long term (current) drug therapy: Secondary | ICD-10-CM | POA: Diagnosis not present

## 2021-10-11 DIAGNOSIS — Z923 Personal history of irradiation: Secondary | ICD-10-CM | POA: Diagnosis not present

## 2021-10-11 DIAGNOSIS — Z5112 Encounter for antineoplastic immunotherapy: Secondary | ICD-10-CM | POA: Insufficient documentation

## 2021-10-11 LAB — CBC WITH DIFFERENTIAL (CANCER CENTER ONLY)
Abs Immature Granulocytes: 0.01 10*3/uL (ref 0.00–0.07)
Basophils Absolute: 0 10*3/uL (ref 0.0–0.1)
Basophils Relative: 1 %
Eosinophils Absolute: 0.1 10*3/uL (ref 0.0–0.5)
Eosinophils Relative: 2 %
HCT: 44.6 % (ref 36.0–46.0)
Hemoglobin: 14.8 g/dL (ref 12.0–15.0)
Immature Granulocytes: 0 %
Lymphocytes Relative: 18 %
Lymphs Abs: 0.7 10*3/uL (ref 0.7–4.0)
MCH: 32.8 pg (ref 26.0–34.0)
MCHC: 33.2 g/dL (ref 30.0–36.0)
MCV: 98.9 fL (ref 80.0–100.0)
Monocytes Absolute: 0.5 10*3/uL (ref 0.1–1.0)
Monocytes Relative: 13 %
Neutro Abs: 2.7 10*3/uL (ref 1.7–7.7)
Neutrophils Relative %: 66 %
Platelet Count: 179 10*3/uL (ref 150–400)
RBC: 4.51 MIL/uL (ref 3.87–5.11)
RDW: 12.1 % (ref 11.5–15.5)
WBC Count: 4 10*3/uL (ref 4.0–10.5)
nRBC: 0 % (ref 0.0–0.2)

## 2021-10-11 LAB — CMP (CANCER CENTER ONLY)
ALT: 15 U/L (ref 0–44)
AST: 19 U/L (ref 15–41)
Albumin: 3.7 g/dL (ref 3.5–5.0)
Alkaline Phosphatase: 59 U/L (ref 38–126)
Anion gap: 11 (ref 5–15)
BUN: 15 mg/dL (ref 6–20)
CO2: 25 mmol/L (ref 22–32)
Calcium: 9.4 mg/dL (ref 8.9–10.3)
Chloride: 108 mmol/L (ref 98–111)
Creatinine: 0.77 mg/dL (ref 0.44–1.00)
GFR, Estimated: >60 mL/min (ref >60)
Glucose, Bld: 96 mg/dL (ref 70–99)
Potassium: 3.8 mmol/L (ref 3.5–5.1)
Sodium: 144 mmol/L (ref 135–145)
Total Bilirubin: 0.4 mg/dL (ref 0.3–1.2)
Total Protein: 6.7 g/dL (ref 6.5–8.1)

## 2021-10-11 MED ORDER — LACOSAMIDE 100 MG PO TABS: ORAL_TABLET | ORAL | 3 refills | Status: DC

## 2021-10-11 MED ORDER — SODIUM CHLORIDE 0.9 % IV SOLN
600.0000 mg | Freq: Once | INTRAVENOUS | Status: AC
  Administered 2021-10-11: 600 mg via INTRAVENOUS
  Filled 2021-10-11: qty 16

## 2021-10-11 MED ORDER — SODIUM CHLORIDE 0.9 % IV SOLN: Freq: Once | INTRAVENOUS | Status: AC

## 2021-10-11 MED ORDER — APIXABAN 5 MG PO TABS
ORAL_TABLET | Freq: Two times a day (BID) | ORAL | 0 refills | Status: DC
  Filled 2021-10-12: qty 60, 30d supply, fill #0
  Filled 2021-10-12: qty 30, 30d supply, fill #0

## 2021-10-11 NOTE — Progress Notes (Signed)
10/11/2021 ok to treat today with Avastin without Urine Protein per Dr Mickeal Skinner.

## 2021-10-11 NOTE — Patient Instructions (Addendum)
[facility]  Discharge Instructions: °Thank you for choosing [facility] to provide your oncology and hematology care.  ° °If you have a lab appointment with the Cancer Center, please go directly to the Cancer Center and check in at the registration area. °  °Wear comfortable clothing and clothing appropriate for easy access to any Portacath or PICC line.  ° °We strive to give you quality time with your provider. You may need to reschedule your appointment if you arrive late (15 or more minutes).  Arriving late affects you and other patients whose appointments are after yours.  Also, if you miss three or more appointments without notifying the office, you may be dismissed from the clinic at the provider’s discretion.    °  °For prescription refill requests, have your pharmacy contact our office and allow 72 hours for refills to be completed.   ° °Today you received the following chemotherapy and/or immunotherapy agents: Bevacizumab.     °  °To help prevent nausea and vomiting after your treatment, we encourage you to take your nausea medication as directed. ° °BELOW ARE SYMPTOMS THAT SHOULD BE REPORTED IMMEDIATELY: °*FEVER GREATER THAN 100.4 F (38 °C) OR HIGHER °*CHILLS OR SWEATING °*NAUSEA AND VOMITING THAT IS NOT CONTROLLED WITH YOUR NAUSEA MEDICATION °*UNUSUAL SHORTNESS OF BREATH °*UNUSUAL BRUISING OR BLEEDING °*URINARY PROBLEMS (pain or burning when urinating, or frequent urination) °*BOWEL PROBLEMS (unusual diarrhea, constipation, pain near the anus) °TENDERNESS IN MOUTH AND THROAT WITH OR WITHOUT PRESENCE OF ULCERS (sore throat, sores in mouth, or a toothache) °UNUSUAL RASH, SWELLING OR PAIN  °UNUSUAL VAGINAL DISCHARGE OR ITCHING  ° °Items with * indicate a potential emergency and should be followed up as soon as possible or go to the Emergency Department if any problems should occur. ° °Please show the CHEMOTHERAPY ALERT CARD or IMMUNOTHERAPY ALERT CARD at check-in  to the Emergency Department and triage nurse. ° °Should you have questions after your visit or need to cancel or reschedule your appointment, please contact [facility]  Dept: [phone]  and follow the prompts.  Office hours are 8:00 a.m. to 4:30 p.m. Monday - Friday. Please note that voicemails left after 4:00 p.m. may not be returned until the following business day.  We are closed weekends and major holidays. You have access to a nurse at all times for urgent questions. Please call the main number to the clinic Dept: [phone] and follow the prompts. ° ° °For any non-urgent questions, you may also contact your provider using MyChart. We now offer e-Visits for anyone 18 and older to request care online for non-urgent symptoms. For details visit [email or web address]. °  °Also download the MyChart app! Go to the app store, search "MyChart", open the app, select [facility], and log in with your MyChart username and password. ° °Due to Covid, a mask is required upon entering the hospital/clinic. If you do not have a mask, one will be given to you upon arrival. For doctor visits, patients may have 1 support person aged 18 or older with them. For treatment visits, patients cannot have anyone with them due to current Covid guidelines and our immunocompromised population.  ° °

## 2021-10-11 NOTE — Progress Notes (Signed)
[facility] [address] [place], [place] [address] [phone]   Interval Evaluation  Date of Service: 10/11/21 Patient Name: Hannah [name] Patient MRN: [id number] Patient DOB: [birth year] Provider: [name], MD  Identifying Statement:  Hannah [name] is a 60 y.o. female with left frontal glioblastoma   Oncologic History: Oncology History  GBM (glioblastoma multiforme) ([place])   Initial Diagnosis   GBM (glioblastoma multiforme) ([place])   02/17/2020 Surgery   Craniotomy, left frontal resection by Dr. Marcello Moores.  Path is glioblastoma.   08/28/2020 - 10/09/2020 Radiation Therapy   Radiation and concurrent Temozolomide 47m/m2 daily   11/10/2020 - 12/09/2020 Chemotherapy   Completes 1 cycle of 5-day Temozolomide      [facility]   [facility] for [facility], undergoes thrombectomy and IVC filter placement   03/09/2021 Progression      03/27/2021 -  Chemotherapy   Initiates second line therapy with Avastin 165mkg IV q2 weeks    08/08/2021 -  Chemotherapy   Patient is on Treatment Plan : BRAIN GLIOBLASTOMA Consolidation Temozolomide Days 1-5 q28 Days        Biomarkers:  MGMT Methylated.  IDH 1/2 Wild type.  EGFR Unknown  TERT Unknown   Interval History:  Hannah [name] presents today for avastin infusion, now having completed cycle #2 of metronomic Temodar.  Denies new or progressive neurologic deficits today.  She is still participating minimally in coversations, activities of daily living, fully dependent on husband and family all ALDs.  Has taken full 8073mose of Temodar most days.  Baclofen has helped with spasticity but she takes half of the 50m73mll.  Continues on Vimpat to 100mg34mmg.    Decadron 11/09/20: 2mg 057m0/22: 8mg 0250m/22: 6mg 03/71m22: 4mg 03/240m2: 8mg 04/1153m: 6mg 05/12/77m 3mg 05/23/252m2mg 06/06/2285m.5mg 04/23/21:57m H+P (01/20/20) Patient presented one month ago with  sudden onset left arm and leg weakness and interrupted speech, [name], [name], stroke eval and CNS imaging which demonstrated a non-enhancing left frontal mass.  At this time she is back to baseline without recurrence of events, taking Keppra 500mg twice per24m.  No history or seizure or any other neurologic events.  She does describe being sleep deprived prior to seizure event.  She presents today after one month follow up MRI scan.  Medications: Current Outpatient Medications on File Prior to Visit  Medication Sig Dispense Refill   amLODipine (NORVASC) 5 MG tablet Take 1 tablet (5 mg total) by mouth daily. 30 tablet 3   apixaban (ELIQUIS) 5 MG TABS tablet TAKE 1 TABLET (5 MG TOTAL) BY MOUTH 2 (TWO) TIMES DAILY. 60 tablet 3   Baclofen 5 MG TABS Take 1 tablet (5 mg) by mouth 3 (three) times daily. 90 tablet 3   cholecalciferol (VITAMIN D3) 25 MCG (1000 UNIT) tablet Take 1,000 Units by mouth daily.     Lacosamide 100 MG TABS Take 1 tablet by mouth in the morning (100mg) and 2 tab4m by mouth at bedtime (200mg) 90 tablet 5mondansetron (ZOFRAN) 8 MG tablet Take 1 tablet (8 mg total) by mouth 2 (two) times daily as needed (nausea and vomiting). May take 30-60 minutes prior to Temodar administration if nausea/vomiting occurs. 30 tablet 1   temozolomide (TEMODAR) 20 MG capsule Take 4 capsules (  80 mg total) by mouth daily. May take on an empty stomach to decrease nausea & vomiting. 120 capsule 0   ciprofloxacin (CIPRO) 500 MG tablet Take 1 tablet (500 mg total) by mouth 2 (two) times daily. (Patient not taking: Reported on 07/16/2021) 28 tablet 0   docusate sodium (COLACE) 100 MG capsule Take 100 mg by mouth 2 (two) times daily as needed for mild constipation. (Patient not taking: Reported on 09/25/2021)     methylphenidate (RITALIN) 5 MG tablet Take 1 tablet (5 mg total) by mouth 2  (two) times daily. (Patient not taking: Reported on 07/30/2021) 60 tablet 0   [DISCONTINUED] lamoTRIgine (LAMICTAL) 100 MG tablet Take 1 tablet (100 mg total) by mouth daily. 30 tablet 3   No current facility-administered medications on file prior to visit.    Allergies: No Known Allergies Past Medical History:  Past Medical History:  Diagnosis Date   Cervical spondylolysis 12/15/2019   Skeleton: Mild cervical spondylosis C6-7. Incidental find.    Colon polyp    GBM (glioblastoma multiforme) ([facility])    Left ankle sprain 10/07/2011   Seizure ([place])    partial    Tick bite 02/2020   Past Surgical History:  Past Surgical History:  Procedure Laterality Date   APPLICATION OF CRANIAL NAVIGATION Left 02/16/2020   Procedure: APPLICATION OF CRANIAL NAVIGATION;  Surgeon: Vallarie Mare, MD;  Location: [facility];  Service: Neurosurgery;  Laterality: Left;   CRANIOTOMY Left 02/16/2020   Procedure: LEFT FRONTAL CRANIOTOMY FOR BRAIN TUMOR;  Surgeon: Vallarie Mare, MD;  Location: [facility];  Service: Neurosurgery;  Laterality: Left;   FOOT SURGERY Left    bone spur - local anesthesia   IR ANGIOGRAM PULMONARY BILATERAL SELECTIVE  12/15/2020   IR ANGIOGRAM SELECTIVE EACH ADDITIONAL VESSEL  12/15/2020   IR ANGIOGRAM SELECTIVE EACH ADDITIONAL VESSEL  12/15/2020   IR IVC FILTER PLMT / S&I /IMG GUID/MOD SED  12/15/2020   IR THROMBECT PRIM MECH INIT (INCLU) MOD SED  12/15/2020   IR THROMBECT PRIM MECH INIT (INCLU) MOD SED  12/15/2020   RADIOLOGY WITH ANESTHESIA N/A 12/15/2020   Procedure: IR WITH ANESTHESIA;  Surgeon: Radiologist, Medication, MD;  Location: [facility];  Service: Radiology;  Laterality: N/A;   TONSILECTOMY/ADENOIDECTOMY WITH MYRINGOTOMY     tonsils and adenoids out only   WISDOM TOOTH EXTRACTION     Social History:  Social History   Socioeconomic History   Marital status: Married    Spouse name: Not on file   Number of children: 3   Years of education: Not on file   Highest education level:  Not on file  Occupational History   Not on file  Tobacco Use   Smoking status: Former    Years: 2.00    Types: Cigarettes    Quit date: 11/05/1979    Years since quitting: 41.9   Smokeless tobacco: Never   Tobacco comments:    1/2 pack a week  Vaping Use   Vaping Use: Never used  Substance and Sexual Activity   Alcohol use: Yes    Alcohol/week: 0.0 standard drinks    Comment: 1 glass of wine weekly   Drug use: No   Sexual activity: Yes    Birth control/protection: Surgical  Other Topics Concern   Not on file  Social History Narrative   Not on file   Social Determinants of Health   Financial Resource Strain: Not on file  Food Insecurity: Not on file  Transportation Needs: Not on  file  Physical Activity: Not on file  Stress: Not on file  Social Connections: Not on file  Intimate Partner Violence: Not on file   Family History:  Family History  Problem Relation Age of Onset   Hypertension Mother    Stroke Mother    Diabetes Mother    Colon cancer Father 57   Hypertension Sister    Colon polyps Sister    Heart disease Brother 73   Diabetes Maternal Uncle    Diabetes Maternal Uncle     Review of Systems: Constitutional: Doesn't report fevers, chills or abnormal weight loss Eyes: Doesn't report blurriness of vision Ears, nose, mouth, throat, and face: Doesn't report sore throat Respiratory: Chest wall pain Cardiovascular: Doesn't report palpitation, chest discomfort  Gastrointestinal:  Doesn't report nausea, constipation, diarrhea GU: Doesn't report incontinence Skin: Doesn't report skin rashes Neurological: Per HPI Musculoskeletal: R shoulder pain, limited ROM Behavioral/Psych: Doesn't report anxiety  Physical Exam: Vitals:   10/11/21 0945  BP: 123/70  Pulse: 84  Resp: 18  Temp: 98.1 F (36.7 C)  SpO2: 100%    KPS: 60. General: Alert, cooperative, pleasant, in no acute distress Head: Normal EENT: No conjunctival injection or scleral icterus.   Lungs: Resp effort normal Cardiac: Regular rate Abdomen: Non-distended abdomen Skin: No rashes cyanosis or petechiae. Extremities: No clubbing or edema  Neurologic Exam: Mental Status: Awake, alert, attentive to examiner. Both receptive and expressive dysphasia.  Cranial Nerves: Visual acuity is grossly normal. Visual fields are full. Extra-ocular movements intact. No ptosis. Face is symmetric Motor: Tone and bulk are normal. Right leg 3/5, left leg 4/5, proximal greater than distal weakness.  Noted R leg flexor spasticity. Reflexes are symmetric, no pathologic reflexes present.  Sensory: Intact to light touch Gait: Deferred today  Labs: I have reviewed the data as listed    Component Value Date/Time   NA 144 10/11/2021 0916   K 3.8 10/11/2021 0916   CL 108 10/11/2021 0916   CO2 25 10/11/2021 0916   GLUCOSE 96 10/11/2021 0916   GLUCOSE 85 10/13/2006 1158   BUN 15 10/11/2021 0916   CREATININE 0.77 10/11/2021 0916   CALCIUM 9.4 10/11/2021 0916   PROT 6.7 10/11/2021 0916   ALBUMIN 3.7 10/11/2021 0916   AST 19 10/11/2021 0916   ALT 15 10/11/2021 0916   ALKPHOS 59 10/11/2021 0916   BILITOT 0.4 10/11/2021 0916   GFRNONAA >60 10/11/2021 0916   GFRAA >60 02/17/2020 0136   Lab Results  Component Value Date   WBC 4.0 10/11/2021   NEUTROABS 2.7 10/11/2021   HGB 14.8 10/11/2021   HCT 44.6 10/11/2021   MCV 98.9 10/11/2021   PLT 179 10/11/2021   Imaging:  [facility] Clinician Interpretation: I have personally reviewed the CNS images as listed.  My interpretation, in the context of the patient's clinical presentation, is stable disease  MR BRAIN W WO CONTRAST  Result Date: 10/04/2021 CLINICAL DATA:  Brain/central nervous system neoplasm, assess treatment response. Follow-up glioblastoma. EXAM: MRI HEAD WITHOUT AND WITH CONTRAST TECHNIQUE: Multiplanar, multiecho pulse sequences of the brain and surrounding structures were obtained without and with intravenous contrast. CONTRAST:  51m  GADAVIST GADOBUTROL 1 MMOL/ML IV SOLN COMPARISON:  MR head 07/27/2021. FINDINGS: Brain: Heterogeneous mass straddling the body of the corpus callosum again identified. Appearance is similar to the prior study. Extent of significant surrounding T2 FLAIR hyperintensity in both cerebral hemispheres is also unchanged. No new abnormal enhancement. There is no acute infarction. No significant mass effect. Ventricles are  stable in size. Vascular: Major vessel flow voids at the skull base are preserved. Skull and upper cervical spine: Normal marrow signal is preserved. Sinuses/Orbits: Paranasal sinuses are aerated. Orbits are unremarkable. Other: Sella is unremarkable.  Mastoid air cells are clear. IMPRESSION: Stable appearance. No new or progressive enhancement or T2 FLAIR abnormality. Electronically Signed   By: Macy Mis M.D.   On: 10/04/2021 11:34    Assessment/Plan GBM (glioblastoma multiforme) ([facility])  Seizure disorder ([place])  Hannah [name] is clinically stable today, now having completed cycle #2 of metronomic Temodar.  MRI brain demonstrates overall stable findings when compared to prior scan from September, which had demonstrated progression of enhancement with the corpus callosum.  We recommended continuing treatment with cycle #3 daily metronomic Temozolomide 50 mg/m2.  The patient will have a complete blood count performed on days 21 and 28 of each cycle, and a comprehensive metabolic panel performed on day 28 of each cycle. Labs may need to be performed more often. Zofran will prescribed for home use for nausea/vomiting.   Chemotherapy should be held for the following:  ANC less than 1,000  Platelets less than 100,000  LFT or creatinine greater than 2x ULN  If clinical concerns/contraindications develop  In meantime, will continue treatment with Avastin 78m/kg IV q2 weeks.  Avastin will help treat inflammatory processes in the brain, and act as steroid sparing agent.  Avastin should  be held for the following:  ANC less than 500  Platelets less than 50,000  LFT or creatinine greater than 2x ULN  If clinical concerns/contraindications develop   Will con't Norvasc 5108mdaily.  Will con't Vimpat 200/100, Eliquis as prior.   Baclofen con't 40m74m8 pRN.  We ask that Hannah [name] return to clinic in 2 weeks following for next avastin infusion or sooner if needed.    All questions were answered. The patient knows to call the clinic with any problems, questions or concerns. No barriers to learning were detected.  I have spent a total of 40 minutes of face-to-face and non-face-to-face time, excluding clinical staff time, preparing to see patient, ordering tests and/or medications, counseling the patient, and independently interpreting results and communicating results to the patient/family/caregiver    ZacVentura SellersD Medical Director of Neuro-Oncology [facility] Wes[place]er/08/22 10:33 AM

## 2021-10-12 ENCOUNTER — Telehealth: Payer: Self-pay | Admitting: *Deleted

## 2021-10-12 ENCOUNTER — Telehealth: Payer: Self-pay | Admitting: Internal Medicine

## 2021-10-12 ENCOUNTER — Other Ambulatory Visit (HOSPITAL_BASED_OUTPATIENT_CLINIC_OR_DEPARTMENT_OTHER): Payer: Self-pay

## 2021-10-12 NOTE — Telephone Encounter (Signed)
Scheduled per 12/8 los, pt has been called and husband confirmed appt

## 2021-10-12 NOTE — Telephone Encounter (Signed)
Received PC from [facility] stating they have a question regarding this patient's Eliquis rx.  A quantity of 30 tablets was prescribed but they only supply 60 tablets, asking if it is ok to fill 60 tablets. Conversation routed to Dr. Mickeal Skinner

## 2021-10-19 ENCOUNTER — Encounter: Payer: Self-pay | Admitting: Internal Medicine

## 2021-10-19 ENCOUNTER — Other Ambulatory Visit: Payer: 59

## 2021-10-19 DIAGNOSIS — Z515 Encounter for palliative care: Secondary | ICD-10-CM

## 2021-10-19 NOTE — Progress Notes (Signed)
[employer] delivered donated chux pads to patient's home.

## 2021-10-20 ENCOUNTER — Other Ambulatory Visit: Payer: Self-pay

## 2021-10-23 ENCOUNTER — Other Ambulatory Visit (HOSPITAL_COMMUNITY): Payer: Self-pay

## 2021-10-25 ENCOUNTER — Other Ambulatory Visit: Payer: Self-pay

## 2021-10-25 ENCOUNTER — Inpatient Hospital Stay: Payer: 59

## 2021-10-25 ENCOUNTER — Inpatient Hospital Stay: Payer: 59 | Admitting: Internal Medicine

## 2021-10-25 ENCOUNTER — Other Ambulatory Visit (HOSPITAL_COMMUNITY): Payer: Self-pay

## 2021-10-25 VITALS — BP 128/95 | HR 92 | Temp 98.1°F | Resp 17 | Ht 65.0 in | Wt 135.5 lb

## 2021-10-25 DIAGNOSIS — G40909 Epilepsy, unspecified, not intractable, without status epilepticus: Secondary | ICD-10-CM | POA: Diagnosis not present

## 2021-10-25 DIAGNOSIS — C719 Malignant neoplasm of brain, unspecified: Secondary | ICD-10-CM

## 2021-10-25 DIAGNOSIS — Z5112 Encounter for antineoplastic immunotherapy: Secondary | ICD-10-CM | POA: Diagnosis not present

## 2021-10-25 LAB — CBC WITH DIFFERENTIAL (CANCER CENTER ONLY)
Abs Immature Granulocytes: 0 10*3/uL (ref 0.00–0.07)
Basophils Absolute: 0 10*3/uL (ref 0.0–0.1)
Basophils Relative: 0 %
Eosinophils Absolute: 0.1 10*3/uL (ref 0.0–0.5)
Eosinophils Relative: 2 %
HCT: 43.3 % (ref 36.0–46.0)
Hemoglobin: 14.5 g/dL (ref 12.0–15.0)
Immature Granulocytes: 0 %
Lymphocytes Relative: 17 %
Lymphs Abs: 0.6 10*3/uL — ABNORMAL LOW (ref 0.7–4.0)
MCH: 32.6 pg (ref 26.0–34.0)
MCHC: 33.5 g/dL (ref 30.0–36.0)
MCV: 97.3 fL (ref 80.0–100.0)
Monocytes Absolute: 0.3 10*3/uL (ref 0.1–1.0)
Monocytes Relative: 10 %
Neutro Abs: 2.6 10*3/uL (ref 1.7–7.7)
Neutrophils Relative %: 71 %
Platelet Count: 203 10*3/uL (ref 150–400)
RBC: 4.45 MIL/uL (ref 3.87–5.11)
RDW: 11.9 % (ref 11.5–15.5)
WBC Count: 3.6 10*3/uL — ABNORMAL LOW (ref 4.0–10.5)
nRBC: 0 % (ref 0.0–0.2)

## 2021-10-25 LAB — CMP (CANCER CENTER ONLY)
ALT: 11 U/L (ref 0–44)
AST: 17 U/L (ref 15–41)
Albumin: 4.1 g/dL (ref 3.5–5.0)
Alkaline Phosphatase: 51 U/L (ref 38–126)
Anion gap: 10 (ref 5–15)
BUN: 15 mg/dL (ref 6–20)
CO2: 26 mmol/L (ref 22–32)
Calcium: 9.5 mg/dL (ref 8.9–10.3)
Chloride: 106 mmol/L (ref 98–111)
Creatinine: 0.76 mg/dL (ref 0.44–1.00)
GFR, Estimated: >60 mL/min (ref >60)
Glucose, Bld: 136 mg/dL — ABNORMAL HIGH (ref 70–99)
Potassium: 3.6 mmol/L (ref 3.5–5.1)
Sodium: 142 mmol/L (ref 135–145)
Total Bilirubin: 0.5 mg/dL (ref 0.3–1.2)
Total Protein: 6.7 g/dL (ref 6.5–8.1)

## 2021-10-25 LAB — TOTAL PROTEIN, URINE DIPSTICK: Protein, ur: NEGATIVE mg/dL

## 2021-10-25 MED ORDER — SODIUM CHLORIDE 0.9 % IV SOLN
600.0000 mg | Freq: Once | INTRAVENOUS | Status: AC
  Administered 2021-10-25: 12:00:00 600 mg via INTRAVENOUS
  Filled 2021-10-25: qty 16

## 2021-10-25 MED ORDER — TEMOZOLOMIDE 20 MG PO CAPS: 80.0000 mg | ORAL_CAPSULE | Freq: Every day | ORAL | 0 refills | Status: DC

## 2021-10-25 MED ORDER — SODIUM CHLORIDE 0.9 % IV SOLN
Freq: Once | INTRAVENOUS | Status: AC
Start: 2021-10-25 — End: 2021-10-25

## 2021-10-25 NOTE — Patient Instructions (Signed)
[facility]  Discharge Instructions: °Thank you for choosing [facility] to provide your oncology and hematology care.  ° °If you have a lab appointment with the Cancer Center, please go directly to the Cancer Center and check in at the registration area. °  °Wear comfortable clothing and clothing appropriate for easy access to any Portacath or PICC line.  ° °We strive to give you quality time with your provider. You may need to reschedule your appointment if you arrive late (15 or more minutes).  Arriving late affects you and other patients whose appointments are after yours.  Also, if you miss three or more appointments without notifying the office, you may be dismissed from the clinic at the provider’s discretion.    °  °For prescription refill requests, have your pharmacy contact our office and allow 72 hours for refills to be completed.   ° °Today you received the following chemotherapy and/or immunotherapy agents: bevacizumab    °  °To help prevent nausea and vomiting after your treatment, we encourage you to take your nausea medication as directed. ° °BELOW ARE SYMPTOMS THAT SHOULD BE REPORTED IMMEDIATELY: °*FEVER GREATER THAN 100.4 F (38 °C) OR HIGHER °*CHILLS OR SWEATING °*NAUSEA AND VOMITING THAT IS NOT CONTROLLED WITH YOUR NAUSEA MEDICATION °*UNUSUAL SHORTNESS OF BREATH °*UNUSUAL BRUISING OR BLEEDING °*URINARY PROBLEMS (pain or burning when urinating, or frequent urination) °*BOWEL PROBLEMS (unusual diarrhea, constipation, pain near the anus) °TENDERNESS IN MOUTH AND THROAT WITH OR WITHOUT PRESENCE OF ULCERS (sore throat, sores in mouth, or a toothache) °UNUSUAL RASH, SWELLING OR PAIN  °UNUSUAL VAGINAL DISCHARGE OR ITCHING  ° °Items with * indicate a potential emergency and should be followed up as soon as possible or go to the Emergency Department if any problems should occur. ° °Please show the CHEMOTHERAPY ALERT CARD or IMMUNOTHERAPY ALERT CARD at check-in  to the Emergency Department and triage nurse. ° °Should you have questions after your visit or need to cancel or reschedule your appointment, please contact [facility]  Dept: [phone]  and follow the prompts.  Office hours are 8:00 a.m. to 4:30 p.m. Monday - Friday. Please note that voicemails left after 4:00 p.m. may not be returned until the following business day.  We are closed weekends and major holidays. You have access to a nurse at all times for urgent questions. Please call the main number to the clinic Dept: [phone] and follow the prompts. ° ° °For any non-urgent questions, you may also contact your provider using MyChart. We now offer e-Visits for anyone 18 and older to request care online for non-urgent symptoms. For details visit [email or web address]. °  °Also download the MyChart app! Go to the app store, search "MyChart", open the app, select [facility], and log in with your MyChart username and password. ° °Due to Covid, a mask is required upon entering the hospital/clinic. If you do not have a mask, one will be given to you upon arrival. For doctor visits, patients may have 1 support person aged 18 or older with them. For treatment visits, patients cannot have anyone with them due to current Covid guidelines and our immunocompromised population.  ° °

## 2021-10-25 NOTE — Progress Notes (Signed)
[place] at [place], [place] [id number] [phone]   Interval Evaluation  Date of Service: 10/25/21 Patient Name: Hannah [name] Patient MRN: [id number] Patient DOB: September 21, 1961 Provider: [name], MD  Identifying Statement:  Hannah [name] is a 60 y.o. female with left frontal glioblastoma   Oncologic History: Oncology History  GBM (glioblastoma multiforme) ([facility])   Initial Diagnosis   GBM (glioblastoma multiforme) ([facility])   02/17/2020 Surgery   Craniotomy, left frontal resection by Dr. Marcello Moores.  Path is glioblastoma.   08/28/2020 - 10/09/2020 Radiation Therapy   Radiation and concurrent Temozolomide 84m/m2 daily   11/10/2020 - 12/09/2020 Chemotherapy   Completes 1 cycle of 5-day Temozolomide      [facility]   Admitted for saddle PE, undergoes thrombectomy and IVC filter placement   03/09/2021 Progression      03/27/2021 -  Chemotherapy   Initiates second line therapy with Avastin 138mkg IV q2 weeks    08/08/2021 -  Chemotherapy   Patient is on Treatment Plan : BRAIN GLIOBLASTOMA Consolidation Temozolomide Days 1-5 q28 Days        Biomarkers:  MGMT Methylated.  IDH 1/2 Wild type.  EGFR Unknown  TERT Unknown   Interval History:  Hannah [name] presents today for avastin infusion, now in midst of cycle #3 of metronomic Temodar.  No new or progressive neurologic symptoms today.  She is still participating minimally in coversations, activities of daily living, fully dependent on husband and family all ALDs.  Has taken full 8062mose of Temodar most days.  Baclofen has helped with spasticity but she takes half of the 72m65mll.  Continues on Vimpat to 100mg81mmg.    Decadron 11/09/20: 2mg 076m0/22: 8mg 0246m/22: 6mg 03/64m22: 4mg 03/244m2: 8mg 04/1141m: 6mg 05/12/59m 3mg 05/23/236m2mg 06/06/2215m.5mg 04/23/21:55m H+P (01/20/20) Patient presented one month ago with sudden onset  left arm and leg weakness and interrupted speech, [name], [name], stroke eval and CNS imaging which demonstrated a non-enhancing left frontal mass.  At this time she is back to baseline without recurrence of events, taking Keppra 500mg twice per25m.  No history or seizure or any other neurologic events.  She does describe being sleep deprived prior to seizure event.  She presents today after one month follow up MRI scan.  Medications: Current Outpatient Medications on File Prior to Visit  Medication Sig Dispense Refill   amLODipine (NORVASC) 5 MG tablet Take 1 tablet (5 mg total) by mouth daily. 30 tablet 3   apixaban (ELIQUIS) 5 MG TABS tablet TAKE 1 TABLET (5 MG TOTAL) BY MOUTH 2 (TWO) TIMES DAILY. 60 tablet 0   Baclofen 5 MG TABS Take 1 tablet (5 mg) by mouth 3 (three) times daily. 90 tablet 3   cholecalciferol (VITAMIN D3) 25 MCG (1000 UNIT) tablet Take 1,000 Units by mouth daily.     Lacosamide 100 MG TABS Take 1 tablet by mouth in the morning (100mg) and 2 tab11m by mouth at bedtime (200mg) 90 tablet 41mondansetron (ZOFRAN) 8 MG tablet Take 1 tablet (8 mg total) by mouth 2 (two) times daily as needed (nausea and vomiting). May take 30-60 minutes prior to Temodar administration if nausea/vomiting occurs. 30 tablet 1   ciprofloxacin (CIPRO) 500 MG tablet Take 1  tablet (500 mg total) by mouth 2 (two) times daily. (Patient not taking: Reported on 07/16/2021) 28 tablet 0   docusate sodium (COLACE) 100 MG capsule Take 100 mg by mouth 2 (two) times daily as needed for mild constipation. (Patient not taking: Reported on 09/25/2021)     methylphenidate (RITALIN) 5 MG tablet Take 1 tablet (5 mg total) by mouth 2 (two) times daily. (Patient not taking: Reported on 07/30/2021) 60 tablet 0   [DISCONTINUED] lamoTRIgine (LAMICTAL) 100 MG tablet Take 1 tablet (100 mg total) by mouth  daily. 30 tablet 3   No current facility-administered medications on file prior to visit.    Allergies: No Known Allergies Past Medical History:  Past Medical History:  Diagnosis Date   Cervical spondylolysis 12/15/2019   Skeleton: Mild cervical spondylosis C6-7. Incidental find.    Colon polyp    GBM (glioblastoma multiforme) (HCC)    Left ankle sprain 10/07/2011   Seizure ([facility])    partial    Tick bite 02/2020   Past Surgical History:  Past Surgical History:  Procedure Laterality Date   APPLICATION OF CRANIAL NAVIGATION Left 02/16/2020   Procedure: APPLICATION OF CRANIAL NAVIGATION;  Surgeon: Vallarie Mare, MD;  Location: [facility];  Service: Neurosurgery;  Laterality: Left;   CRANIOTOMY Left 02/16/2020   Procedure: LEFT FRONTAL CRANIOTOMY FOR BRAIN TUMOR;  Surgeon: Vallarie Mare, MD;  Location: [facility];  Service: Neurosurgery;  Laterality: Left;   FOOT SURGERY Left    bone spur - local anesthesia   IR ANGIOGRAM PULMONARY BILATERAL SELECTIVE  12/15/2020   IR ANGIOGRAM SELECTIVE EACH ADDITIONAL VESSEL  12/15/2020   IR ANGIOGRAM SELECTIVE EACH ADDITIONAL VESSEL  12/15/2020   IR IVC FILTER PLMT / S&I /IMG GUID/MOD SED  12/15/2020   IR THROMBECT PRIM MECH INIT (INCLU) MOD SED  12/15/2020   IR THROMBECT PRIM MECH INIT (INCLU) MOD SED  12/15/2020   RADIOLOGY WITH ANESTHESIA N/A 12/15/2020   Procedure: IR WITH ANESTHESIA;  Surgeon: Radiologist, Medication, MD;  Location: [facility];  Service: Radiology;  Laterality: N/A;   TONSILECTOMY/ADENOIDECTOMY WITH MYRINGOTOMY     tonsils and adenoids out only   WISDOM TOOTH EXTRACTION     Social History:  Social History   Socioeconomic History   Marital status: Married    Spouse name: Not on file   Number of children: 3   Years of education: Not on file   Highest education level: Not on file  Occupational History   Not on file  Tobacco Use   Smoking status: Former    Years: 2.00    Types: Cigarettes    Quit date: 11/05/1979    Years since  quitting: 42.0   Smokeless tobacco: Never   Tobacco comments:    1/2 pack a week  Vaping Use   Vaping Use: Never used  Substance and Sexual Activity   Alcohol use: Yes    Alcohol/week: 0.0 standard drinks    Comment: 1 glass of wine weekly   Drug use: No   Sexual activity: Yes    Birth control/protection: Surgical  Other Topics Concern   Not on file  Social History Narrative   Not on file   Social Determinants of Health   Financial Resource Strain: Not on file  Food Insecurity: Not on file  Transportation Needs: Not on file  Physical Activity: Not on file  Stress: Not on file  Social Connections: Not on file  Intimate Partner Violence: Not on file   Family History:  Family History  Problem Relation Age of Onset   Hypertension Mother    Stroke Mother    Diabetes Mother    Colon cancer Father 23   Hypertension Sister    Colon polyps Sister    Heart disease Brother 59   Diabetes Maternal Uncle    Diabetes Maternal Uncle     Review of Systems: Constitutional: Doesn't report fevers, chills or abnormal weight loss Eyes: Doesn't report blurriness of vision Ears, nose, mouth, throat, and face: Doesn't report sore throat Respiratory: Chest wall pain Cardiovascular: Doesn't report palpitation, chest discomfort  Gastrointestinal:  Doesn't report nausea, constipation, diarrhea GU: Doesn't report incontinence Skin: Doesn't report skin rashes Neurological: Per HPI Musculoskeletal: R shoulder pain, limited ROM Behavioral/Psych: Doesn't report anxiety  Physical Exam: Vitals:   10/25/21 0945  BP: (!) 128/95  Pulse: 92  Resp: 17  Temp: 98.1 F (36.7 C)  SpO2: 95%    KPS: 60. General: Alert, cooperative, pleasant, in no acute distress Head: Normal EENT: No conjunctival injection or scleral icterus.  Lungs: Resp effort normal Cardiac: Regular rate Abdomen: Non-distended abdomen Skin: No rashes cyanosis or petechiae. Extremities: No clubbing or edema  Neurologic  Exam: Mental Status: Awake, alert, attentive to examiner. Both receptive and expressive dysphasia.  Cranial Nerves: Visual acuity is grossly normal. Visual fields are full. Extra-ocular movements intact. No ptosis. Face is symmetric Motor: Tone and bulk are normal. Right leg 3/5, left leg 4/5, proximal greater than distal weakness.  Noted R leg flexor spasticity. Reflexes are symmetric, no pathologic reflexes present.  Sensory: Intact to light touch Gait: Deferred today  Labs: I have reviewed the data as listed    Component Value Date/Time   NA 142 10/25/2021 0920   K 3.6 10/25/2021 0920   CL 106 10/25/2021 0920   CO2 26 10/25/2021 0920   GLUCOSE 136 (H) 10/25/2021 0920   GLUCOSE 85 10/13/2006 1158   BUN 15 10/25/2021 0920   CREATININE 0.76 10/25/2021 0920   CALCIUM 9.5 10/25/2021 0920   PROT 6.7 10/25/2021 0920   ALBUMIN 4.1 10/25/2021 0920   AST 17 10/25/2021 0920   ALT 11 10/25/2021 0920   ALKPHOS 51 10/25/2021 0920   BILITOT 0.5 10/25/2021 0920   GFRNONAA >60 10/25/2021 0920   GFRAA >60 02/17/2020 0136   Lab Results  Component Value Date   WBC 3.6 (L) 10/25/2021   NEUTROABS 2.6 10/25/2021   HGB 14.5 10/25/2021   HCT 43.3 10/25/2021   MCV 97.3 10/25/2021   PLT 203 10/25/2021   Imaging:  [facility] Clinician Interpretation: I have personally reviewed the CNS images as listed.  My interpretation, in the context of the patient's clinical presentation, is stable disease  MR BRAIN W WO CONTRAST  Result Date: 10/04/2021 CLINICAL DATA:  Brain/central nervous system neoplasm, assess treatment response. Follow-up glioblastoma. EXAM: MRI HEAD WITHOUT AND WITH CONTRAST TECHNIQUE: Multiplanar, multiecho pulse sequences of the brain and surrounding structures were obtained without and with intravenous contrast. CONTRAST:  19m GADAVIST GADOBUTROL 1 MMOL/ML IV SOLN COMPARISON:  MR head 07/27/2021. FINDINGS: Brain: Heterogeneous mass straddling the body of the corpus callosum again  identified. Appearance is similar to the prior study. Extent of significant surrounding T2 FLAIR hyperintensity in both cerebral hemispheres is also unchanged. No new abnormal enhancement. There is no acute infarction. No significant mass effect. Ventricles are stable in size. Vascular: Major vessel flow voids at the skull base are preserved. Skull and upper cervical spine: Normal marrow signal is preserved. Sinuses/Orbits: Paranasal sinuses  are aerated. Orbits are unremarkable. Other: Sella is unremarkable.  Mastoid air cells are clear. IMPRESSION: Stable appearance. No new or progressive enhancement or T2 FLAIR abnormality. Electronically Signed   By: Macy Mis M.D.   On: 10/04/2021 11:34    Assessment/Plan GBM (glioblastoma multiforme) ([facility]CC) - Plan: temozolomide (TEMODAR) 20 MG capsule  Seizure disorder ([facility])  Hannah [name] is clinically stable today, now halfway through cycle #3 of metronomic Temodar.  No new or progressive deficits.  We recommended continuing treatment with cycle #3 daily metronomic Temozolomide 50 mg/m2.  The patient will have a complete blood count performed on days 21 and 28 of each cycle, and a comprehensive metabolic panel performed on day 28 of each cycle. Labs may need to be performed more often. Zofran will prescribed for home use for nausea/vomiting.   Chemotherapy should be held for the following:  ANC less than 1,000  Platelets less than 100,000  LFT or creatinine greater than 2x ULN  If clinical concerns/contraindications develop  Will continue treatment with Avastin 27m/kg IV q2 weeks.  Avastin will help treat inflammatory processes in the brain, and act as steroid sparing agent.  Avastin should be held for the following:  ANC less than 500  Platelets less than 50,000  LFT or creatinine greater than 2x ULN  If clinical concerns/contraindications develop   Will con't Norvasc 571mdaily.  Will con't Vimpat 200/100, Eliquis as prior.    Baclofen con't 68m58m8 pRN.  We ask that Hannah [name] return to clinic in 2 weeks following for next avastin infusion or sooner if needed.  Next MRI brain in 6 weeks.  All questions were answered. The patient knows to call the clinic with any problems, questions or concerns. No barriers to learning were detected.  I have spent a total of 40 minutes of face-to-face and non-face-to-face time, excluding clinical staff time, preparing to see patient, ordering tests and/or medications, counseling the patient, and independently interpreting results and communicating results to the patient/family/caregiver    ZacVentura SellersD Medical Director of Neuro-Oncology [facility] WesTyrone/22/22 10:15 AM

## 2021-10-26 ENCOUNTER — Telehealth: Payer: Self-pay | Admitting: Internal Medicine

## 2021-10-26 ENCOUNTER — Other Ambulatory Visit (HOSPITAL_COMMUNITY): Payer: Self-pay

## 2021-10-26 ENCOUNTER — Other Ambulatory Visit: Payer: Self-pay | Admitting: Pharmacist

## 2021-10-26 DIAGNOSIS — C719 Malignant neoplasm of brain, unspecified: Secondary | ICD-10-CM

## 2021-10-26 MED ORDER — TEMOZOLOMIDE 20 MG PO CAPS
80.0000 mg | ORAL_CAPSULE | Freq: Every day | ORAL | 0 refills | Status: DC
  Filled 2021-10-26: qty 120, 30d supply, fill #0

## 2021-10-26 NOTE — Progress Notes (Addendum)
Oral Oncology Pharmacist Encounter  Prescription refill for Temodar (temozolomide) sent to [facility] in error. Patient fills Temodar through [facility]. Prescription redirected to [facility] for dispensing.  ADDENDUM: 10/26/2021 9:31 AM Notified by specialty pharmacy that due to patient's recent change in insurance, [facility] is unable to continue filling temozolomide for patient. Confirmed that [facility] is already processing temozolomide for patient. Temozolomide order that was redirected to [facility] has been canceled and patient will receive upcoming fill from [facility].   Leron Croak, PharmD, BCPS Hematology/Oncology Clinical Pharmacist [facility] [phone] 10/26/2021 9:15 AM

## 2021-10-26 NOTE — Telephone Encounter (Signed)
Scheduled per 12/22 los, pt has been called and husband confirmed appt

## 2021-11-03 NOTE — Progress Notes (Signed)
[facility] RN NOTE  PATIENT NAME: Hannah [name] DOB: Sep 16, 1961 MRN: [id number]  PRIMARY CARE PROVIDER: [name], DO  RESPONSIBLE PARTY:  Acct ID - Guarantor Home Phone Work Phone Relationship Acct Type  [id number] CLEOTILDE, SPADACCINI762 [phone]  Self P/F     [address], [place], [place] [address]   Covid-19 Pre-screening Negative  PLAN OF CARE and INTERVENTION:  ADVANCE CARE PLANNING/GOALS OF CARE: Goal is for patient to remain in her home. She has a DNR. PATIENT/CAREGIVER EDUCATION: Explained palliative care services, safe transfers, s/s of infection DISEASE STATUS: Initial palliative care visit made with LCSW, M. Lonon. Met with patient, her husband Lanny Hurst and daughter [name] in their home who are her primary caregivers. Upon arrival, patient is sitting up in her recliner awake and alert. She does have some word finding difficulties and is slow to respond at times. She has a diagnosis of glioblastoma multiforme. Patient used to be very active and athletic up until about 2 years ago when she had her first seizure. She was then found to have a small tumor in her brain and now has multiple ones. She did undergo a craniotomy. She has started chemotherapy, however has experienced side effects such as increased weakness, fatigue, anxiety and insomnia. Her breathing is regular and unlabored. She denies any pain at time of visit. She does experience some cramping in her legs, right greater than left. It is relieved with the use of Baclofen, massages and stretching. She is total care with ADLs. She is transferred to wheelchair and transported this way. They try to give her showers 2x/week. They are having difficulty getting her up and downstairs. She is incontinent of both bowel and bladder. She has a small stage 2 noted to her sacrum and husband is applying a wound cream to help. He inquired about the [facility] Urine collection system. Educated on the pros and cons of  using this system. She does have some difficulty swallowing her food and medications at times. This is helped by husband demonstrating to her how to swallow. Husband agrees to future visits with palliative care. Consent signed.   HISTORY OF PRESENT ILLNESS: This is a 60 yo female with a diagnosis of glioblastoma multiforme (GBM). She has a history of acute massive pulmonary embolism, gait instability, sleep disturbance, steroid induced hyperglycemia, hypokalemia and Vitamin D deficiency. Palliative care team has been asked to follow patient for additional support, goals of care and complex decision making.  CODE STATUS: DNR ADVANCED DIRECTIVES: Y MOST FORM: no PPS: 30%   PHYSICAL EXAM:   VITALS: Today's Vitals   09/04/21 1226  BP: (!) 128/91  Pulse: 83  Resp: 16  Temp: 97.6 F (36.4 C)  TempSrc: Temporal  SpO2: 96%  PainSc: 0-No pain    LUNGS: clear to auscultation  CARDIAC: Cor RRR EXTREMITIES: No edema SKIN:  Stage 2 to sacrum   NEURO:  Alert and oriented to person/place, slow to respond, word-finding difficulties, transferred to wheelchair for locomotion   (Duration of visit and documentation 90 minutes)   Daryl Eastern, RN BSN

## 2021-11-08 ENCOUNTER — Ambulatory Visit: Payer: 59

## 2021-11-08 ENCOUNTER — Other Ambulatory Visit (HOSPITAL_BASED_OUTPATIENT_CLINIC_OR_DEPARTMENT_OTHER): Payer: Self-pay

## 2021-11-08 ENCOUNTER — Encounter: Payer: Self-pay | Admitting: Internal Medicine

## 2021-11-08 ENCOUNTER — Other Ambulatory Visit: Payer: Self-pay | Admitting: Internal Medicine

## 2021-11-08 ENCOUNTER — Other Ambulatory Visit: Payer: 59

## 2021-11-08 ENCOUNTER — Ambulatory Visit: Payer: 59 | Admitting: Internal Medicine

## 2021-11-08 ENCOUNTER — Telehealth: Payer: Self-pay | Admitting: *Deleted

## 2021-11-08 MED ORDER — BACLOFEN 5 MG PO TABS
5.0000 mg | ORAL_TABLET | Freq: Three times a day (TID) | ORAL | 3 refills | Status: DC
  Filled 2021-11-08: qty 90, 30d supply, fill #0

## 2021-11-08 MED ORDER — APIXABAN 5 MG PO TABS
ORAL_TABLET | Freq: Two times a day (BID) | ORAL | 2 refills | Status: DC
  Filled 2021-11-08: qty 60, 30d supply, fill #0

## 2021-11-08 NOTE — Telephone Encounter (Signed)
Patients spouse called to see if he could reschedule lab/md/infusion appointment for today.  Patient just doesn't feel up to the appointments today.  Sent rescheduling message.  He also states that the last refill of Temodar hasn't been processed through [facility].  He states that [facility] told him that it was sent to [facility] but they still haven't received it.  Routing to Oral Oncology Pharmacist to see if they can assist in getting the drug to the patient.   He also needs refill of Eliquis and Baclofen.  Routing to MD to refill.

## 2021-11-09 ENCOUNTER — Telehealth: Payer: Self-pay | Admitting: Internal Medicine

## 2021-11-09 NOTE — Telephone Encounter (Signed)
Scheduled per 01/05 scheduled message, patient has been called and notified. °

## 2021-11-12 ENCOUNTER — Inpatient Hospital Stay: Payer: 59

## 2021-11-12 ENCOUNTER — Inpatient Hospital Stay: Payer: 59 | Admitting: Internal Medicine

## 2021-11-12 ENCOUNTER — Other Ambulatory Visit (HOSPITAL_BASED_OUTPATIENT_CLINIC_OR_DEPARTMENT_OTHER): Payer: Self-pay

## 2021-11-12 ENCOUNTER — Inpatient Hospital Stay: Payer: 59 | Attending: Internal Medicine

## 2021-11-12 ENCOUNTER — Other Ambulatory Visit: Payer: Self-pay

## 2021-11-12 VITALS — BP 129/86 | HR 86 | Temp 97.3°F | Resp 17 | Wt 139.4 lb

## 2021-11-12 DIAGNOSIS — Z23 Encounter for immunization: Secondary | ICD-10-CM | POA: Diagnosis not present

## 2021-11-12 DIAGNOSIS — Z87891 Personal history of nicotine dependence: Secondary | ICD-10-CM | POA: Insufficient documentation

## 2021-11-12 DIAGNOSIS — Z5112 Encounter for antineoplastic immunotherapy: Secondary | ICD-10-CM | POA: Insufficient documentation

## 2021-11-12 DIAGNOSIS — Z9221 Personal history of antineoplastic chemotherapy: Secondary | ICD-10-CM | POA: Diagnosis not present

## 2021-11-12 DIAGNOSIS — Z7901 Long term (current) use of anticoagulants: Secondary | ICD-10-CM | POA: Diagnosis not present

## 2021-11-12 DIAGNOSIS — C719 Malignant neoplasm of brain, unspecified: Secondary | ICD-10-CM | POA: Diagnosis not present

## 2021-11-12 DIAGNOSIS — C711 Malignant neoplasm of frontal lobe: Secondary | ICD-10-CM | POA: Diagnosis present

## 2021-11-12 DIAGNOSIS — Z79899 Other long term (current) drug therapy: Secondary | ICD-10-CM | POA: Insufficient documentation

## 2021-11-12 DIAGNOSIS — G40909 Epilepsy, unspecified, not intractable, without status epilepticus: Secondary | ICD-10-CM | POA: Diagnosis not present

## 2021-11-12 LAB — CMP (CANCER CENTER ONLY)
ALT: 12 U/L (ref 0–44)
AST: 18 U/L (ref 15–41)
Albumin: 3.9 g/dL (ref 3.5–5.0)
Alkaline Phosphatase: 49 U/L (ref 38–126)
Anion gap: 10 (ref 5–15)
BUN: 12 mg/dL (ref 6–20)
CO2: 24 mmol/L (ref 22–32)
Calcium: 9.2 mg/dL (ref 8.9–10.3)
Chloride: 108 mmol/L (ref 98–111)
Creatinine: 0.72 mg/dL (ref 0.44–1.00)
GFR, Estimated: >60 mL/min (ref >60)
Glucose, Bld: 141 mg/dL — ABNORMAL HIGH (ref 70–99)
Potassium: 3.5 mmol/L (ref 3.5–5.1)
Sodium: 142 mmol/L (ref 135–145)
Total Bilirubin: 0.4 mg/dL (ref 0.3–1.2)
Total Protein: 6.4 g/dL — ABNORMAL LOW (ref 6.5–8.1)

## 2021-11-12 LAB — CBC WITH DIFFERENTIAL (CANCER CENTER ONLY)
Abs Immature Granulocytes: 0.01 10*3/uL (ref 0.00–0.07)
Basophils Absolute: 0 10*3/uL (ref 0.0–0.1)
Basophils Relative: 0 %
Eosinophils Absolute: 0.1 10*3/uL (ref 0.0–0.5)
Eosinophils Relative: 2 %
HCT: 40.7 % (ref 36.0–46.0)
Hemoglobin: 14.5 g/dL (ref 12.0–15.0)
Immature Granulocytes: 0 %
Lymphocytes Relative: 13 %
Lymphs Abs: 0.6 10*3/uL — ABNORMAL LOW (ref 0.7–4.0)
MCH: 33.7 pg (ref 26.0–34.0)
MCHC: 35.6 g/dL (ref 30.0–36.0)
MCV: 94.7 fL (ref 80.0–100.0)
Monocytes Absolute: 0.5 10*3/uL (ref 0.1–1.0)
Monocytes Relative: 11 %
Neutro Abs: 3.3 10*3/uL (ref 1.7–7.7)
Neutrophils Relative %: 74 %
Platelet Count: 177 10*3/uL (ref 150–400)
RBC: 4.3 MIL/uL (ref 3.87–5.11)
RDW: 12 % (ref 11.5–15.5)
WBC Count: 4.5 10*3/uL (ref 4.0–10.5)
nRBC: 0 % (ref 0.0–0.2)

## 2021-11-12 LAB — TOTAL PROTEIN, URINE DIPSTICK: Protein, ur: NEGATIVE mg/dL

## 2021-11-12 MED ORDER — SODIUM CHLORIDE 0.9 % IV SOLN
600.0000 mg | Freq: Once | INTRAVENOUS | Status: AC
  Administered 2021-11-12: 600 mg via INTRAVENOUS
  Filled 2021-11-12: qty 16

## 2021-11-12 MED ORDER — SODIUM CHLORIDE 0.9 % IV SOLN: Freq: Once | INTRAVENOUS | Status: AC

## 2021-11-12 NOTE — Patient Instructions (Signed)
[facility]ICAL [facility]COLOG[facility]   °Discharge Instructions: °Thank you for choosing [facility] to provide your oncology and hematology care.  ° °If you have a lab appointment with the Cancer Center, please go directly to the Cancer Center and check in at the registration area. °  °Wear comfortable clothing and clothing appropriate for easy access to any Portacath or PICC line.  ° °We strive to give you quality time with your provider. You may need to reschedule your appointment if you arrive late (15 or more minutes).  Arriving late affects you and other patients whose appointments are after yours.  Also, if you miss three or more appointments without notifying the office, you may be dismissed from the clinic at the provider’s discretion.    °  °For prescription refill requests, have your pharmacy contact our office and allow 72 hours for refills to be completed.   ° °Today you received the following chemotherapy and/or immunotherapy agents: avastin  °  °To help prevent nausea and vomiting after your treatment, we encourage you to take your nausea medication as directed. ° °BELOW ARE SYMPTOMS THAT SHOULD BE REPORTED IMMEDIATELY: °*FEVER GREATER THAN 100.4 F (38 °C) OR HIGHER °*CHILLS OR SWEATING °*NAUSEA AND VOMITING THAT IS NOT CONTROLLED WITH YOUR NAUSEA MEDICATION °*UNUSUAL SHORTNESS OF BREATH °*UNUSUAL BRUISING OR BLEEDING °*URINARY PROBLEMS (pain or burning when urinating, or frequent urination) °*BOWEL PROBLEMS (unusual diarrhea, constipation, pain near the anus) °TENDERNESS IN MOUTH AND THROAT WITH OR WITHOUT PRESENCE OF ULCERS (sore throat, sores in mouth, or a toothache) °UNUSUAL RASH, SWELLING OR PAIN  °UNUSUAL VAGINAL DISCHARGE OR ITCHING  ° °Items with * indicate a potential emergency and should be followed up as soon as possible or go to the Emergency Department if any problems should occur. ° °Please show the CHEMOTHERAPY ALERT CARD or IMMUNOTHERAPY ALERT CARD at check-in to  the Emergency Department and triage nurse. ° °Should you have questions after your visit or need to cancel or reschedule your appointment, please contact [facility]  Dept: [phone]  and follow the prompts.  Office hours are 8:00 a.m. to 4:30 p.m. Monday - Friday. Please note that voicemails left after 4:00 p.m. may not be returned until the following business day.  We are closed weekends and major holidays. You have access to a nurse at all times for urgent questions. Please call the main number to the clinic Dept: [phone] and follow the prompts. ° ° °For any non-urgent questions, you may also contact your provider using MyChart. We now offer e-Visits for anyone 18 and older to request care online for non-urgent symptoms. For details visit [email or web address]. °  °Also download the MyChart app! Go to the app store, search "MyChart", open the app, select [facility], and log in with your MyChart username and password. ° °Due to Covid, a mask is required upon entering the hospital/clinic. If you do not have a mask, one will be given to you upon arrival. For doctor visits, patients may have 1 support person aged 18 or older with them. For treatment visits, patients cannot have anyone with them due to current Covid guidelines and our immunocompromised population.  ° °

## 2021-11-12 NOTE — Progress Notes (Signed)
[facility] [place], [place] [address] [phone]   Interval Evaluation  Date of Service: 11/12/21 Patient Name: Hannah [name] Patient MRN: [id number] Patient DOB: 10/01/1961 Provider: [name], MD  Identifying Statement:  Hannah [name] is a 61 y.o. female with left frontal glioblastoma   Oncologic History: Oncology History  GBM (glioblastoma multiforme) ([facility])   Initial Diagnosis   GBM (glioblastoma multiforme) ([facility])   02/17/2020 Surgery   Craniotomy, left frontal resection by Dr. Marcello Moores.  Path is glioblastoma.   08/28/2020 - 10/09/2020 Radiation Therapy   Radiation and concurrent Temozolomide 57m/m2 daily   11/10/2020 - 12/09/2020 Chemotherapy   Completes 1 cycle of 5-day Temozolomide      [facility]   [facility] for [facility], undergoes thrombectomy and IVC filter placement   03/09/2021 Progression      03/27/2021 -  Chemotherapy   Initiates second line therapy with Avastin 150mkg IV q2 weeks    08/08/2021 -  Chemotherapy   Patient is on Treatment Plan : BRAIN GLIOBLASTOMA Consolidation Temozolomide Days 1-5 q28 Days        Biomarkers:  MGMT Methylated.  IDH 1/2 Wild type.  EGFR Unknown  TERT Unknown   Interval History:  Hannah [name] presents today for avastin infusion, after one week delay in treatment.  Had enjoyable holiday with family but it left her more tired than normal.  No new or progressive neurologic symptoms today.  She is still participating minimally in coversations, activities of daily living, fully dependent on husband and family all ALDs.  Has taken full 8088mose of Temodar most days.  Baclofen has helped with spasticity but she takes half of the 21m81mll.  Continues on Vimpat to 100mg53mmg.    Decadron 11/09/20: 2mg 047m0/22: 8mg 0214m/22: 6mg 03/23m22: 4mg 03/223m2: 8mg 04/1115m: 6mg 05/12/33m 3mg 05/23/252m2mg 06/06/2227m.5mg 04/23/21:2m  H+P (01/20/20) Patient presented one month ago with sudden onset left arm and leg weakness and interrupted speech, [name], [name], stroke eval and CNS imaging which demonstrated a non-enhancing left frontal mass.  At this time she is back to baseline without recurrence of events, taking Keppra 500mg twice per15m.  No history or seizure or any other neurologic events.  She does describe being sleep deprived prior to seizure event.  She presents today after one month follow up MRI scan.  Medications: Current Outpatient Medications on File Prior to Visit  Medication Sig Dispense Refill   amLODipine (NORVASC) 5 MG tablet Take 1 tablet (5 mg total) by mouth daily. 30 tablet 3   apixaban (ELIQUIS) 5 MG TABS tablet TAKE 1 TABLET (5 MG TOTAL) BY MOUTH 2 (TWO) TIMES DAILY. 60 tablet 2   Baclofen 5 MG TABS Take 1 tablet (5 mg) by mouth 3 (three) times daily. 90 tablet 3   cholecalciferol (VITAMIN D3) 25 MCG (1000 UNIT) tablet Take 1,000 Units by mouth daily.     docusate sodium (COLACE) 100 MG capsule Take 100 mg by mouth 2 (two) times daily as needed for mild constipation.     Lacosamide 100 MG TABS Take 1 tablet by mouth in the morning (100mg) and 2 tab69m by mouth at bedtime (200mg) 90 tablet 63mmethylphenidate (RITALIN) 5 MG tablet Take 1 tablet (5 mg  total) by mouth 2 (two) times daily. 60 tablet 0   ondansetron (ZOFRAN) 8 MG tablet Take 1 tablet (8 mg total) by mouth 2 (two) times daily as needed (nausea and vomiting). May take 30-60 minutes prior to Temodar administration if nausea/vomiting occurs. 30 tablet 1   temozolomide (TEMODAR) 20 MG capsule Take 4 capsules (80 mg total) by mouth daily. May take on an empty stomach to decrease nausea & vomiting. 120 capsule 0   ciprofloxacin (CIPRO) 500 MG tablet Take 1 tablet (500 mg total) by mouth 2 (two) times daily. (Patient not  taking: Reported on 07/16/2021) 28 tablet 0   [DISCONTINUED] lamoTRIgine (LAMICTAL) 100 MG tablet Take 1 tablet (100 mg total) by mouth daily. 30 tablet 3   Current Facility-Administered Medications on File Prior to Visit  Medication Dose Route Frequency Provider Last Rate Last Admin   bevacizumab-awwb (MVASI) 600 mg in sodium chloride 0.9 % 100 mL chemo infusion  600 mg Intravenous Once [name], MD 372 mL/hr at 11/12/21 1608 600 mg at 11/12/21 1608    Allergies: No Known Allergies Past Medical History:  Past Medical History:  Diagnosis Date   Cervical spondylolysis 12/15/2019   Skeleton: Mild cervical spondylosis C6-7. Incidental find.    Colon polyp    GBM (glioblastoma multiforme) ([facility])    Left ankle sprain 10/07/2011   Seizure ([facility])    partial    Tick bite 02/2020   Past Surgical History:  Past Surgical History:  Procedure Laterality Date   APPLICATION OF CRANIAL NAVIGATION Left 02/16/2020   Procedure: APPLICATION OF CRANIAL NAVIGATION;  Surgeon: Vallarie Mare, MD;  Location: [facility];  Service: Neurosurgery;  Laterality: Left;   CRANIOTOMY Left 02/16/2020   Procedure: LEFT FRONTAL CRANIOTOMY FOR BRAIN TUMOR;  Surgeon: Vallarie Mare, MD;  Location: [facility];  Service: Neurosurgery;  Laterality: Left;   FOOT SURGERY Left    bone spur - local anesthesia   IR ANGIOGRAM PULMONARY BILATERAL SELECTIVE  12/15/2020   IR ANGIOGRAM SELECTIVE EACH ADDITIONAL VESSEL  12/15/2020   IR ANGIOGRAM SELECTIVE EACH ADDITIONAL VESSEL  12/15/2020   IR IVC FILTER PLMT / S&I /IMG GUID/MOD SED  12/15/2020   IR THROMBECT PRIM MECH INIT (INCLU) MOD SED  12/15/2020   IR THROMBECT PRIM MECH INIT (INCLU) MOD SED  12/15/2020   RADIOLOGY WITH ANESTHESIA N/A 12/15/2020   Procedure: IR WITH ANESTHESIA;  Surgeon: Radiologist, Medication, MD;  Location: [facility];  Service: Radiology;  Laterality: N/A;   TONSILECTOMY/ADENOIDECTOMY WITH MYRINGOTOMY     tonsils and adenoids out only   WISDOM TOOTH EXTRACTION      Social History:  Social History   Socioeconomic History   Marital status: Married    Spouse name: Not on file   Number of children: 3   Years of education: Not on file   Highest education level: Not on file  Occupational History   Not on file  Tobacco Use   Smoking status: Former    Years: 2.00    Types: Cigarettes    Quit date: 11/05/1979    Years since quitting: 42.0   Smokeless tobacco: Never   Tobacco comments:    1/2 pack a week  Vaping Use   Vaping Use: Never used  Substance and Sexual Activity   Alcohol use: Yes    Alcohol/week: 0.0 standard drinks    Comment: 1 glass of wine weekly   Drug use: No   Sexual activity: Yes    Birth control/protection: Surgical  Other Topics Concern   Not on file  Social History Narrative   Not on file   Social Determinants of Health   Financial Resource Strain: Not on file  Food Insecurity: Not on file  Transportation Needs: Not on file  Physical Activity: Not on file  Stress: Not on file  Social Connections: Not on file  Intimate Partner Violence: Not on file   Family History:  Family History  Problem Relation Age of Onset   Hypertension Mother    Stroke Mother    Diabetes Mother    Colon cancer Father 60   Hypertension Sister    Colon polyps Sister    Heart disease Brother 55   Diabetes Maternal Uncle    Diabetes Maternal Uncle     Review of Systems: Constitutional: Doesn't report fevers, chills or abnormal weight loss Eyes: Doesn't report blurriness of vision Ears, nose, mouth, throat, and face: Doesn't report sore throat Respiratory: Chest wall pain Cardiovascular: Doesn't report palpitation, chest discomfort  Gastrointestinal:  Doesn't report nausea, constipation, diarrhea GU: Doesn't report incontinence Skin: Doesn't report skin rashes Neurological: Per HPI Musculoskeletal: R shoulder pain, limited ROM Behavioral/Psych: Doesn't report anxiety  Physical Exam: Vitals:   11/12/21 1432  BP: 129/86   Pulse: 86  Resp: 17  Temp: (!) 97.3 F (36.3 C)  SpO2: 97%    KPS: 60. General: Alert, cooperative, pleasant, in no acute distress Head: Normal EENT: No conjunctival injection or scleral icterus.  Lungs: Resp effort normal Cardiac: Regular rate Abdomen: Non-distended abdomen Skin: No rashes cyanosis or petechiae. Extremities: No clubbing or edema  Neurologic Exam: Mental Status: Awake, alert, attentive to examiner. Both receptive and expressive dysphasia.  Cranial Nerves: Visual acuity is grossly normal. Visual fields are full. Extra-ocular movements intact. No ptosis. Face is symmetric Motor: Tone and bulk are normal. Right leg 3/5, left leg 4/5, proximal greater than distal weakness.  Noted R leg flexor spasticity. Reflexes are symmetric, no pathologic reflexes present.  Sensory: Intact to light touch Gait: Deferred today  Labs: I have reviewed the data as listed    Component Value Date/Time   NA 142 11/12/2021 1352   K 3.5 11/12/2021 1352   CL 108 11/12/2021 1352   CO2 24 11/12/2021 1352   GLUCOSE 141 (H) 11/12/2021 1352   GLUCOSE 85 10/13/2006 1158   BUN 12 11/12/2021 1352   CREATININE 0.72 11/12/2021 1352   CALCIUM 9.2 11/12/2021 1352   PROT 6.4 (L) 11/12/2021 1352   ALBUMIN 3.9 11/12/2021 1352   AST 18 11/12/2021 1352   ALT 12 11/12/2021 1352   ALKPHOS 49 11/12/2021 1352   BILITOT 0.4 11/12/2021 1352   GFRNONAA >60 11/12/2021 1352   GFRAA >60 02/17/2020 0136   Lab Results  Component Value Date   WBC 4.5 11/12/2021   NEUTROABS 3.3 11/12/2021   HGB 14.5 11/12/2021   HCT 40.7 11/12/2021   MCV 94.7 11/12/2021   PLT 177 11/12/2021    Assessment/Plan No diagnosis found.  Saaya A Montroy is clinically stable today, now having completed cycle #3 of metronomic Temodar.  No new or progressive issues today.  We recommended continuing treatment with cycle #4 daily metronomic Temozolomide 50 mg/m2.  The patient will have a complete blood count performed on  days 21 and 28 of each cycle, and a comprehensive metabolic panel performed on day 28 of each cycle. Labs may need to be performed more often. Zofran will prescribed for home use for nausea/vomiting.   Chemotherapy should be held  for the following:  ANC less than 1,000  Platelets less than 100,000  LFT or creatinine greater than 2x ULN  If clinical concerns/contraindications develop  Will continue treatment with Avastin 9m/kg IV q2 weeks.  Avastin will help treat inflammatory processes in the brain, and act as steroid sparing agent.  Avastin should be held for the following:  ANC less than 500  Platelets less than 50,000  LFT or creatinine greater than 2x ULN  If clinical concerns/contraindications develop   Will con't Norvasc 540mdaily.  Will con't Vimpat 200/100, Eliquis as prior.   Baclofen con't 34m12m8 pRN.  We ask that Carrah A Vanderhoff return to clinic in 3 weeks following MRI brain for next avastin infusion or sooner if needed.    All questions were answered. The patient knows to call the clinic with any problems, questions or concerns. No barriers to learning were detected.  I have spent a total of 40 minutes of face-to-face and non-face-to-face time, excluding clinical staff time, preparing to see patient, ordering tests and/or medications, counseling the patient, and independently interpreting results and communicating results to the patient/family/caregiver    ZacVentura SellersD Medical Director of Neuro-Oncology [facility] [place]/09/23 4:20 PM

## 2021-11-13 ENCOUNTER — Telehealth: Payer: Self-pay | Admitting: Internal Medicine

## 2021-11-13 NOTE — Telephone Encounter (Signed)
Scheduled follow-up appointment per 1/9 los. Patient's husband is aware.

## 2021-11-15 ENCOUNTER — Other Ambulatory Visit: Payer: Self-pay | Admitting: *Deleted

## 2021-11-15 MED ORDER — BACLOFEN 5 MG PO TABS: 5.0000 mg | ORAL_TABLET | Freq: Three times a day (TID) | ORAL | 3 refills | Status: DC

## 2021-11-15 MED ORDER — APIXABAN 5 MG PO TABS: ORAL_TABLET | Freq: Two times a day (BID) | ORAL | 2 refills | Status: DC

## 2021-11-25 IMAGING — MR MR HEAD WO/W CM
13 series · 48 of 48 positions shown · IV contrast (gadavist)
Comparison: March 09, 2021.

CLINICAL DATA: Brain/CNS neoplasm, assess treatment response

On chart review, patient initiated second-line therapy with Avastin
on 03/27/2021.
EXAM:
MRI HEAD WITHOUT AND WITH CONTRAST
TECHNIQUE: Multiplanar, multiecho pulse sequences of the brain and surrounding
structures were obtained without and with intravenous contrast.
CONTRAST:  6mL GADAVIST GADOBUTROL 1 MMOL/ML IV SOLN

[Series 5: DWI · axial · 3.0mm · 1.36mm/px · z∈[-20,+130]mm · 6 of 104 slices shown (1 of 2)]
[im 1/104]
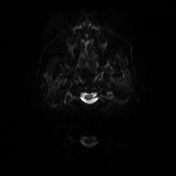
[im 21/104]
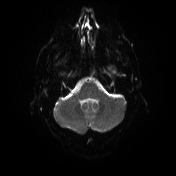
[im 42/104]
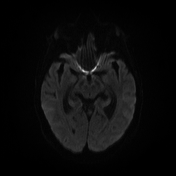
[im 62/104]
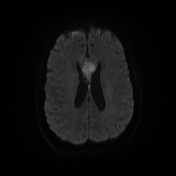
[im 83/104]
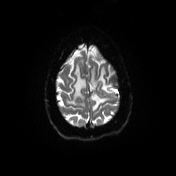
[im 104/104]
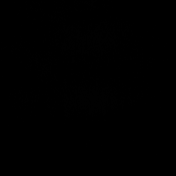

[Series 6: DWI · axial · 3.0mm · 1.36mm/px · z∈[-20,+127]mm · 3 of 51 slices shown (2 of 2)]
[im 1/51]
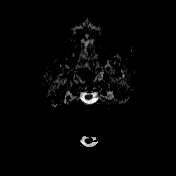
[im 26/51]
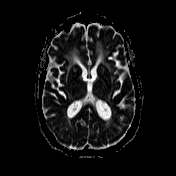
[im 51/51]
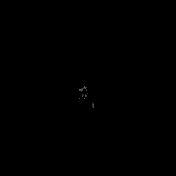

[Series 7: T1 · sagittal · 5.0mm · 0.75mm/px · 1 of 24 slices shown (1 of 2)]
[im 1/24]
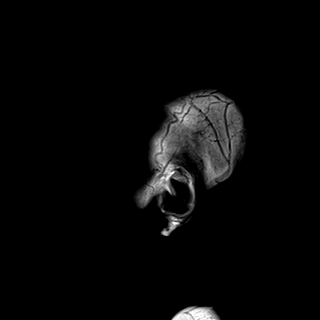

[Series 8: T2 · axial · 5.0mm · 0.62mm/px · z∈[-19,+138]mm · 2 of 26 slices shown]
[im 1/26]
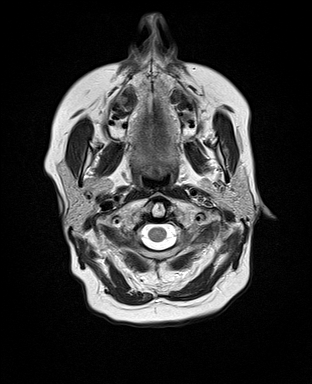
[im 26/26]
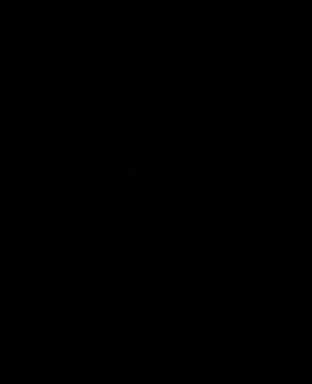

[Series 9: swi_images · axial · 3.0mm · 0.75mm/px · z∈[-20,+139]mm · 3 of 56 slices shown]
[im 1/56]
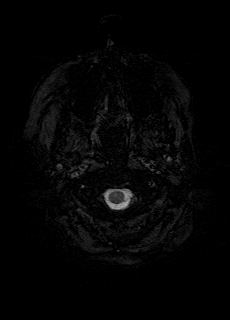
[im 28/56]
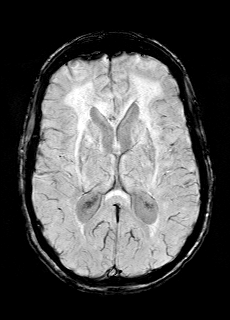
[im 56/56]
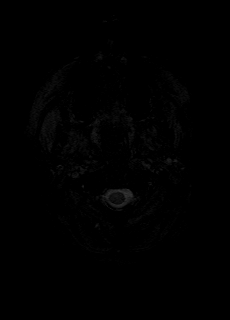

[Series 11: FLAIR · axial · 3.0mm · 0.75mm/px · z∈[-14,+134]mm · 3 of 52 slices shown]
[im 1/52]
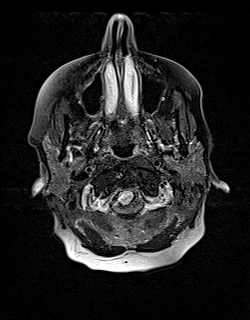
[im 26/52]
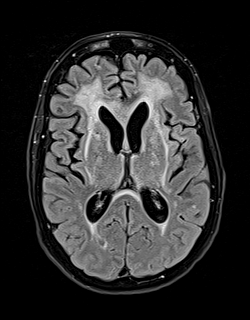
[im 52/52]
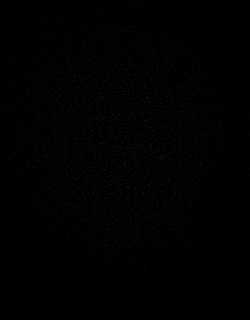

[Series 12: T1 · axial · 1.0mm · 0.94mm/px · z∈[-16,+138]mm · 10 of 160 slices shown (2 of 2)]
[im 1/160]
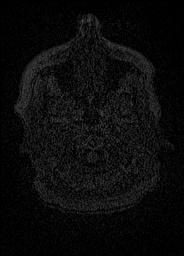
[im 18/160]
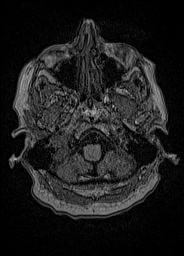
[im 36/160]
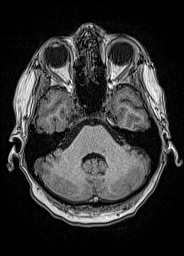
[im 54/160]
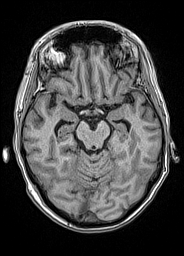
[im 71/160]
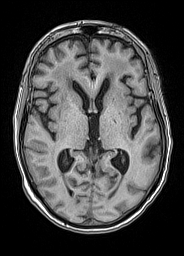
[im 89/160]
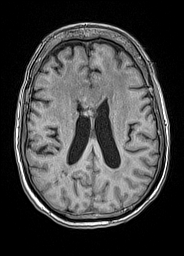
[im 107/160]
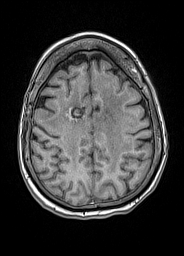
[im 124/160]
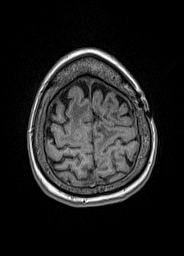
[im 142/160]
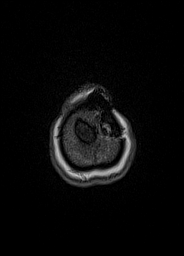
[im 160/160]
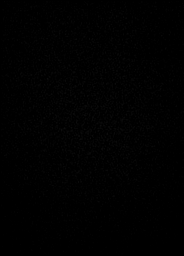

[Series 13: cor dwi_tracew · coronal · 5.0mm · 1.53mm/px · 3 of 54 slices shown]
[im 1/54]
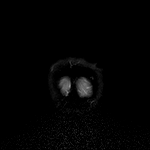
[im 27/54]
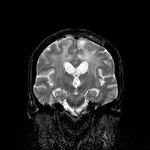
[im 54/54]
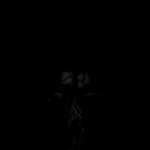

[Series 14: cor dwi_adc · coronal · 5.0mm · 1.53mm/px · 2 of 27 slices shown]
[im 1/27]
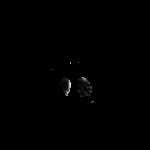
[im 27/27]
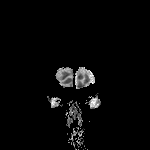

[Series 15: T2 post-contrast · coronal · 5.0mm · 0.57mm/px · 2 of 29 slices shown]
[im 1/29]
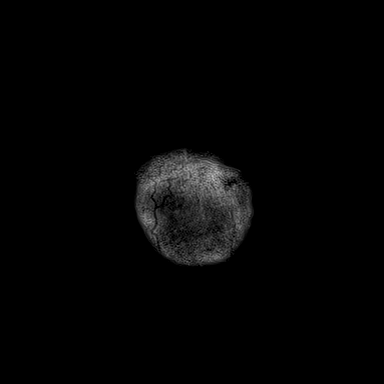
[im 29/29]
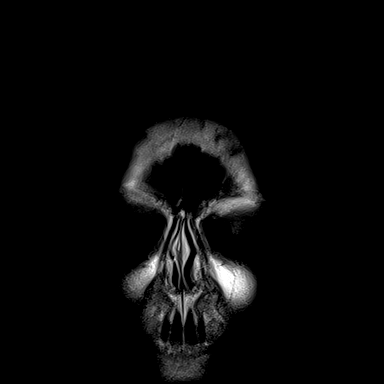

[Series 16: T1 post-contrast · axial · 1.0mm · 0.94mm/px · z∈[-16,+138]mm · 10 of 160 slices shown (1 of 3)]
[im 1/160]
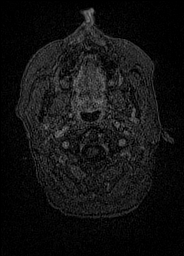
[im 18/160]
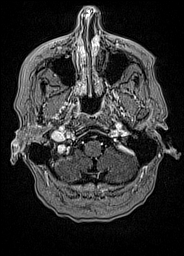
[im 36/160]
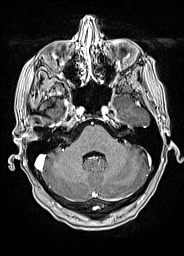
[im 54/160]
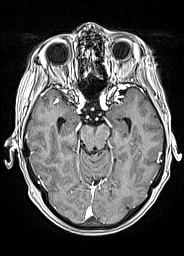
[im 71/160]
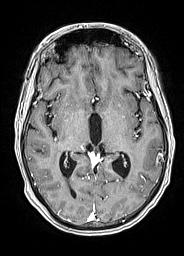
[im 89/160]
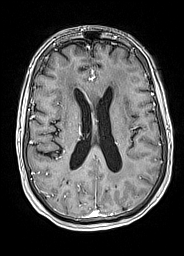
[im 107/160]
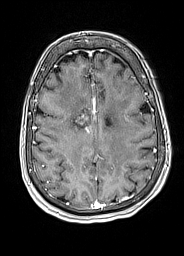
[im 124/160]
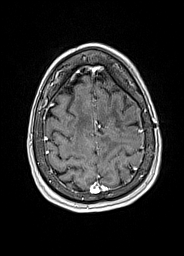
[im 142/160]
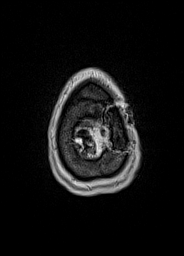
[im 160/160]
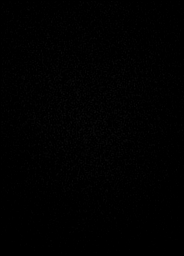

[Series 17: T1 post-contrast · coronal · 5.0mm · 0.43mm/px · 2 of 29 slices shown (2 of 3)]
[im 1/29]
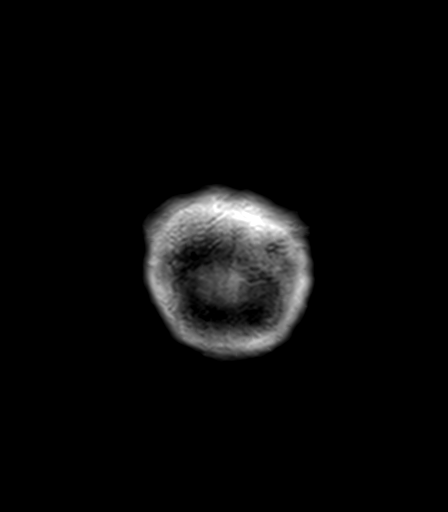
[im 29/29]
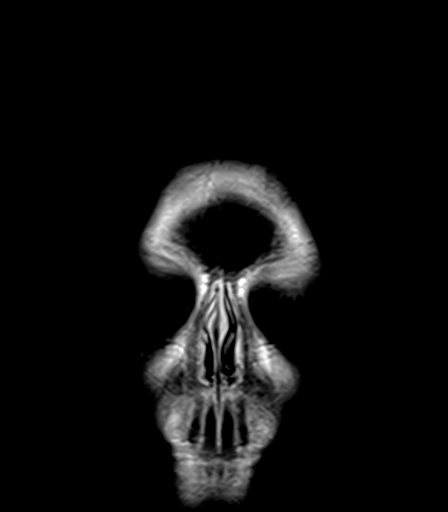

[Series 18: T1 post-contrast · sagittal · 5.0mm · 0.75mm/px · 1 of 24 slices shown (3 of 3)]
[im 1/24]
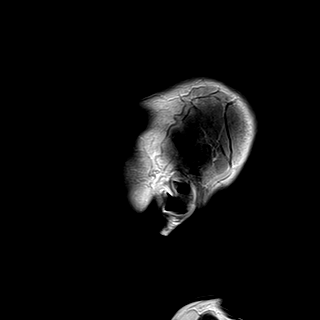

[48 of 48 positions shown; findings below may reference images not displayed]

FINDINGS: Brain: Heterogeneous mass spanning both cerebral hemispheres at the
corpus callosum body with hemorrhagic and necrotic features.
Posteriorly the mass measures similar in size (approximately 4.1 by
2.5 cm); however, there is new faintly enhancing component
anteriorly measuring approximately 2.4 x 1.6 cm which demonstrates
restricted diffusion (see series 16, image 97; series 18, image 13;
series 5, image 84; series 11, image 30).

Similar generalized T2 hyperintensity in the bilateral hemispheric
white matter. No midline shift. Basal cisterns are patent. No
hydrocephalus. No acute hemorrhage.

Vascular: Major arterial flow voids are maintained at the skull
base.

Skull and upper cervical spine: Left frontal craniotomy. Otherwise,
normal marrow signal.

Sinuses/Orbits: Negative.

Other: No mastoid effusions.
IMPRESSION: Findings concerning for tumor progression with new tumor anteriorly
with associated restricted diffusion, detailed above. This area only
faintly enhances, likely related to Avastin treatment.

## 2021-11-27 ENCOUNTER — Other Ambulatory Visit: Payer: Self-pay | Admitting: Radiation Therapy

## 2021-11-29 ENCOUNTER — Ambulatory Visit (HOSPITAL_COMMUNITY)
Admission: RE | Admit: 2021-11-29 | Discharge: 2021-11-29 | Disposition: A | Discharge status: Home / Self Care | Payer: 59 | Source: Ambulatory Visit | Attending: Internal Medicine | Admitting: Internal Medicine

## 2021-11-29 ENCOUNTER — Other Ambulatory Visit: Payer: Self-pay

## 2021-11-29 DIAGNOSIS — C719 Malignant neoplasm of brain, unspecified: Secondary | ICD-10-CM | POA: Diagnosis present

## 2021-11-29 MED ORDER — GADOBUTROL 1 MMOL/ML IV SOLN
6.5000 mL | Freq: Once | INTRAVENOUS | Status: AC | PRN
  Administered 2021-11-29: 6.5 mL via INTRAVENOUS

## 2021-12-01 ENCOUNTER — Other Ambulatory Visit: Payer: Self-pay | Admitting: Internal Medicine

## 2021-12-01 DIAGNOSIS — C719 Malignant neoplasm of brain, unspecified: Secondary | ICD-10-CM

## 2021-12-03 ENCOUNTER — Other Ambulatory Visit (HOSPITAL_COMMUNITY): Payer: Self-pay

## 2021-12-03 ENCOUNTER — Inpatient Hospital Stay: Payer: 59 | Admitting: Internal Medicine

## 2021-12-03 ENCOUNTER — Inpatient Hospital Stay: Payer: 59

## 2021-12-03 ENCOUNTER — Other Ambulatory Visit: Payer: Self-pay

## 2021-12-03 ENCOUNTER — Telehealth: Payer: Self-pay | Admitting: Pharmacist

## 2021-12-03 VITALS — BP 143/98 | HR 87 | Temp 98.1°F | Resp 18

## 2021-12-03 DIAGNOSIS — C719 Malignant neoplasm of brain, unspecified: Secondary | ICD-10-CM

## 2021-12-03 DIAGNOSIS — Z5112 Encounter for antineoplastic immunotherapy: Secondary | ICD-10-CM | POA: Diagnosis not present

## 2021-12-03 DIAGNOSIS — G40909 Epilepsy, unspecified, not intractable, without status epilepticus: Secondary | ICD-10-CM | POA: Diagnosis not present

## 2021-12-03 LAB — CMP (CANCER CENTER ONLY)
ALT: 10 U/L (ref 0–44)
AST: 15 U/L (ref 15–41)
Albumin: 4 g/dL (ref 3.5–5.0)
Alkaline Phosphatase: 46 U/L (ref 38–126)
Anion gap: 8 (ref 5–15)
BUN: 21 mg/dL — ABNORMAL HIGH (ref 6–20)
CO2: 25 mmol/L (ref 22–32)
Calcium: 9.4 mg/dL (ref 8.9–10.3)
Chloride: 110 mmol/L (ref 98–111)
Creatinine: 0.68 mg/dL (ref 0.44–1.00)
GFR, Estimated: >60 mL/min (ref >60)
Glucose, Bld: 103 mg/dL — ABNORMAL HIGH (ref 70–99)
Potassium: 3.2 mmol/L — ABNORMAL LOW (ref 3.5–5.1)
Sodium: 143 mmol/L (ref 135–145)
Total Bilirubin: 0.4 mg/dL (ref 0.3–1.2)
Total Protein: 6.6 g/dL (ref 6.5–8.1)

## 2021-12-03 LAB — CBC WITH DIFFERENTIAL (CANCER CENTER ONLY)
Abs Immature Granulocytes: 0 10*3/uL (ref 0.00–0.07)
Basophils Absolute: 0 10*3/uL (ref 0.0–0.1)
Basophils Relative: 1 %
Eosinophils Absolute: 0.1 10*3/uL (ref 0.0–0.5)
Eosinophils Relative: 2 %
HCT: 41.8 % (ref 36.0–46.0)
Hemoglobin: 14.2 g/dL (ref 12.0–15.0)
Immature Granulocytes: 0 %
Lymphocytes Relative: 18 %
Lymphs Abs: 0.6 10*3/uL — ABNORMAL LOW (ref 0.7–4.0)
MCH: 32.9 pg (ref 26.0–34.0)
MCHC: 34 g/dL (ref 30.0–36.0)
MCV: 97 fL (ref 80.0–100.0)
Monocytes Absolute: 0.4 10*3/uL (ref 0.1–1.0)
Monocytes Relative: 11 %
Neutro Abs: 2.3 10*3/uL (ref 1.7–7.7)
Neutrophils Relative %: 68 %
Platelet Count: 167 10*3/uL (ref 150–400)
RBC: 4.31 MIL/uL (ref 3.87–5.11)
RDW: 12.1 % (ref 11.5–15.5)
WBC Count: 3.3 10*3/uL — ABNORMAL LOW (ref 4.0–10.5)
nRBC: 0 % (ref 0.0–0.2)

## 2021-12-03 LAB — TOTAL PROTEIN, URINE DIPSTICK: Protein, ur: NEGATIVE mg/dL

## 2021-12-03 MED ORDER — SODIUM CHLORIDE 0.9 % IV SOLN: Freq: Once | INTRAVENOUS | Status: AC

## 2021-12-03 MED ORDER — LOMUSTINE 40 MG PO CAPS: 40.0000 mg | ORAL_CAPSULE | Freq: Once | ORAL | 0 refills | Status: AC

## 2021-12-03 MED ORDER — LOMUSTINE 40 MG PO CAPS
40.0000 mg | ORAL_CAPSULE | Freq: Once | ORAL | 0 refills | Status: DC
  Filled 2021-12-03: qty 1, 1d supply, fill #0

## 2021-12-03 MED ORDER — SODIUM CHLORIDE 0.9 % IV SOLN
600.0000 mg | Freq: Once | INTRAVENOUS | Status: AC
  Administered 2021-12-03: 600 mg via INTRAVENOUS
  Filled 2021-12-03: qty 16

## 2021-12-03 MED ORDER — LOMUSTINE 10 MG PO CAPS
10.0000 mg | ORAL_CAPSULE | Freq: Once | ORAL | 0 refills | Status: DC
  Filled 2021-12-03: qty 1, 1d supply, fill #0

## 2021-12-03 MED ORDER — LOMUSTINE 100 MG PO CAPS: 100.0000 mg | ORAL_CAPSULE | Freq: Once | ORAL | 0 refills | Status: AC

## 2021-12-03 MED ORDER — LOMUSTINE 100 MG PO CAPS
100.0000 mg | ORAL_CAPSULE | Freq: Once | ORAL | 0 refills | Status: DC
  Filled 2021-12-03: qty 1, 1d supply, fill #0

## 2021-12-03 MED ORDER — LOMUSTINE 10 MG PO CAPS: 10.0000 mg | ORAL_CAPSULE | Freq: Once | ORAL | 0 refills | Status: AC

## 2021-12-03 NOTE — Telephone Encounter (Signed)
Oral Oncology Pharmacist Encounter  Received new prescription for Gleostine (lomustine) for the treatment of glioblastoma in conjunction with bevacizumab, planned duration until disease progression or unacceptable drug toxicity.  CMP and CBC w/ Diff from 12/03/21 assessed, noted WBC 3.3 K/uL (ANC 2.3 K/uL). No dose adjustments required. Prescription dose and frequency assessed for appropriateness. Appropriate for therapy initiation.   Current medication list in Epic reviewed, no relevant/significant DDIs with Gleostine identified.  Evaluated chart and no patient barriers to medication adherence noted.   Patient agreement for treatment documented in MD note on 12/03/21.  Patient's insurance requires Rx to be filled through [employer] Prescription redirected for dispensing.   Oral Oncology Clinic will continue to follow for insurance authorization, copayment issues, initial counseling and start date.  Leron Croak, PharmD, BCPS Hematology/Oncology Clinical Pharmacist [facility] [place] [phone] 12/03/2021 1:01 PM

## 2021-12-03 NOTE — Patient Instructions (Signed)
[facility] MEDICAL ONCOLOGY  Discharge Instructions: °Thank you for choosing [facility] to provide your oncology and hematology care.  ° °If you have a lab appointment with the Cancer Center, please go directly to the Cancer Center and check in at the registration area. °  °Wear comfortable clothing and clothing appropriate for easy access to any Portacath or PICC line.  ° °We strive to give you quality time with your provider. You may need to reschedule your appointment if you arrive late (15 or more minutes).  Arriving late affects you and other patients whose appointments are after yours.  Also, if you miss three or more appointments without notifying the office, you may be dismissed from the clinic at the provider’s discretion.    °  °For prescription refill requests, have your pharmacy contact our office and allow 72 hours for refills to be completed.   ° °Today you received the following chemotherapy and/or immunotherapy agents: Bevacizumab.     °  °To help prevent nausea and vomiting after your treatment, we encourage you to take your nausea medication as directed. ° °BELOW ARE SYMPTOMS THAT SHOULD BE REPORTED IMMEDIATELY: °*FEVER GREATER THAN 100.4 F (38 °C) OR HIGHER °*CHILLS OR SWEATING °*NAUSEA AND VOMITING THAT IS NOT CONTROLLED WITH YOUR NAUSEA MEDICATION °*UNUSUAL SHORTNESS OF BREATH °*UNUSUAL BRUISING OR BLEEDING °*URINARY PROBLEMS (pain or burning when urinating, or frequent urination) °*BOWEL PROBLEMS (unusual diarrhea, constipation, pain near the anus) °TENDERNESS IN MOUTH AND THROAT WITH OR WITHOUT PRESENCE OF ULCERS (sore throat, sores in mouth, or a toothache) °UNUSUAL RASH, SWELLING OR PAIN  °UNUSUAL VAGINAL DISCHARGE OR ITCHING  ° °Items with * indicate a potential emergency and should be followed up as soon as possible or go to the Emergency Department if any problems should occur. ° °Please show the CHEMOTHERAPY ALERT CARD or IMMUNOTHERAPY ALERT CARD at check-in  to the Emergency Department and triage nurse. ° °Should you have questions after your visit or need to cancel or reschedule your appointment, please contact [facility]  Dept: [phone]  and follow the prompts.  Office hours are 8:00 a.m. to 4:30 p.m. Monday - Friday. Please note that voicemails left after 4:00 p.m. may not be returned until the following business day.  We are closed weekends and major holidays. You have access to a nurse at all times for urgent questions. Please call the main number to the clinic Dept: [phone] and follow the prompts. ° ° °For any non-urgent questions, you may also contact your provider using MyChart. We now offer e-Visits for anyone 18 and older to request care online for non-urgent symptoms. For details visit [email or web address]. °  °Also download the MyChart app! Go to the app store, search "MyChart", open the app, select [facility], and log in with your MyChart username and password. ° °Due to Covid, a mask is required upon entering the hospital/clinic. If you do not have a mask, one will be given to you upon arrival. For doctor visits, patients may have 1 support person aged 18 or older with them. For treatment visits, patients cannot have anyone with them due to current Covid guidelines and our immunocompromised population.  ° °

## 2021-12-03 NOTE — Progress Notes (Signed)
DISCONTINUE ON PATHWAY REGIMEN - Neuro  No Medical Intervention - Off Treatment.  REASON: Disease Progression PRIOR TREATMENT: Off Treatment  START ON PATHWAY REGIMEN - Neuro     A cycle is every 42 days:     Lomustine   **Always confirm dose/schedule in your pharmacy ordering system**  Patient Characteristics: Glioma, Glioblastoma, IDH-wildtype, Recurrent or Progressive, Nonsurgical Candidate, Systemic Therapy Candidate, BRAF V600E Mutation Negative/Unknown and NTRK Fusion Negative/Unknown Disease Classification: Glioma Disease Classification: Glioblastoma, IDH-wildtype Disease Status: Recurrent or Progressive Treatment Classification: Nonsurgical Candidate Treatment (Nonsurgical/Adjuvant): Systemic Therapy Candidate NTRK Gene Fusion Status: Negative BRAF V600E Mutation Status: Negative Intent of Therapy: Non-Curative / Palliative Intent, Discussed with Patient

## 2021-12-03 NOTE — Progress Notes (Signed)
[facility] [place], [place] [address] [phone]   Interval Evaluation  Date of Service: 12/03/21 Patient Name: Hannah [name] Patient MRN: [id number] Patient DOB: [birth year] Provider: [name], MD  Identifying Statement:  Hannah [name] is a 61 y.o. female with left frontal glioblastoma   Oncologic History: Oncology History  GBM (glioblastoma multiforme) ([facility])   Initial Diagnosis   GBM (glioblastoma multiforme) ([facility])   02/17/2020 Surgery   Craniotomy, left frontal resection by Dr. Marcello Moores.  Path is glioblastoma.   08/28/2020 - 10/09/2020 Radiation Therapy   Radiation and concurrent Temozolomide 93m/m2 daily   11/10/2020 - 12/09/2020 Chemotherapy   Completes 1 cycle of 5-day Temozolomide      [facility]   [facility], undergoes thrombectomy and IVC filter placement   03/09/2021 Progression      03/27/2021 -  Chemotherapy   Initiates second line therapy with Avastin 181mkg IV q2 weeks    08/08/2021 -  Chemotherapy   Patient is on Treatment Plan : BRAIN GLIOBLASTOMA Consolidation Temozolomide Days 1-5 q28 Days        Biomarkers:  MGMT Methylated.  IDH 1/2 Wild type.  EGFR Unknown  TERT Unknown   Interval History: Hannah [name] presents today for avastin infusion after recent MRI brain.  Continues to experience significant fatigue, reliance on family for functional support.  No significant issues with Temodar, take full dose most days.  Continues on Vimpat to 10062m00mg.  Not needing decadron at this time.  Decadron 11/09/20: 2mg15m/10/22: 8mg 42m21/22: 6mg 075m7/22: 4mg 0347m/22: 8mg 04/26m22: 6mg 05/118m2: 3mg 05/2328m: 2mg 06/06/41m 1.5mg 06/20/260m-  H+P (01/20/20) Patient presented one month ago with sudden onset left arm and leg weakness and interrupted speech, [name], noticed poor grip on racket and could not  express herself verbally.  This led to ED visit, stroke eval and CNS imaging which demonstrated a non-enhancing left frontal mass.  At this time she is back to baseline without recurrence of events, taking Keppra 500mg twice p2may.  No history or seizure or any other neurologic events.  She does describe being sleep deprived prior to seizure event.  She presents today after one month follow up MRI scan.  Medications: Current Outpatient Medications on File Prior to Visit  Medication Sig Dispense Refill   amLODipine (NORVASC) 5 MG tablet Take 1 tablet (5 mg total) by mouth daily. 30 tablet 3   apixaban (ELIQUIS) 5 MG TABS tablet TAKE 1 TABLET (5 MG TOTAL) BY MOUTH 2 (TWO) TIMES DAILY. 60 tablet 2   Baclofen 5 MG TABS Take 1 tablet (5 mg) by mouth 3 (three) times daily. 90 tablet 3   cholecalciferol (VITAMIN D3) 25 MCG (1000 UNIT) tablet Take 1,000 Units by mouth daily.     ciprofloxacin (CIPRO) 500 MG tablet Take 1 tablet (500 mg total) by mouth 2 (two) times daily. (Patient not taking: Reported on 07/16/2021) 28 tablet 0   docusate sodium (COLACE) 100 MG capsule Take 100 mg by mouth 2 (two) times daily as needed for mild constipation.     Lacosamide 100 MG TABS Take 1 tablet by mouth in the morning (100mg) and 2 t44mts by mouth at bedtime (200mg) 90 table60m  methylphenidate (RITALIN) 5 MG tablet Take 1 tablet (5 mg total) by mouth 2 (two) times daily. 60 tablet 0   ondansetron (  ZOFRAN) 8 MG tablet Take 1 tablet (8 mg total) by mouth 2 (two) times daily as needed (nausea and vomiting). May take 30-60 minutes prior to Temodar administration if nausea/vomiting occurs. 30 tablet 1   temozolomide (TEMODAR) 20 MG capsule Take 4 capsules (80 mg total) by mouth daily. May take on an empty stomach to decrease nausea & vomiting. 120 capsule 0   [DISCONTINUED] lamoTRIgine (LAMICTAL) 100 MG tablet Take 1 tablet (100 mg total) by mouth daily. 30 tablet 3   No current facility-administered medications on file  prior to visit.    Allergies: No Known Allergies Past Medical History:  Past Medical History:  Diagnosis Date   Cervical spondylolysis 12/15/2019   Skeleton: Mild cervical spondylosis C6-7. Incidental find.    Colon polyp    GBM (glioblastoma multiforme) ([facility])    Left ankle sprain 10/07/2011   Seizure ([facility])    partial    Tick bite 02/2020   Past Surgical History:  Past Surgical History:  Procedure Laterality Date   APPLICATION OF CRANIAL NAVIGATION Left 02/16/2020   Procedure: APPLICATION OF CRANIAL NAVIGATION;  Surgeon: Vallarie Mare, MD;  Location: [facility];  Service: Neurosurgery;  Laterality: Left;   CRANIOTOMY Left 02/16/2020   Procedure: LEFT FRONTAL CRANIOTOMY FOR BRAIN TUMOR;  Surgeon: Vallarie Mare, MD;  Location: [facility];  Service: Neurosurgery;  Laterality: Left;   FOOT SURGERY Left    bone spur - local anesthesia   IR ANGIOGRAM PULMONARY BILATERAL SELECTIVE  12/15/2020   IR ANGIOGRAM SELECTIVE EACH ADDITIONAL VESSEL  12/15/2020   IR ANGIOGRAM SELECTIVE EACH ADDITIONAL VESSEL  12/15/2020   IR IVC FILTER PLMT / S&I /IMG GUID/MOD SED  12/15/2020   IR THROMBECT PRIM MECH INIT (INCLU) MOD SED  12/15/2020   IR THROMBECT PRIM MECH INIT (INCLU) MOD SED  12/15/2020   RADIOLOGY WITH ANESTHESIA N/A 12/15/2020   Procedure: IR WITH ANESTHESIA;  Surgeon: Radiologist, Medication, MD;  Location: [facility];  Service: Radiology;  Laterality: N/A;   TONSILECTOMY/ADENOIDECTOMY WITH MYRINGOTOMY     tonsils and adenoids out only   WISDOM TOOTH EXTRACTION     Social History:  Social History   Socioeconomic History   Marital status: Married    Spouse name: Not on file   Number of children: 3   Years of education: Not on file   Highest education level: Not on file  Occupational History   Not on file  Tobacco Use   Smoking status: Former    Years: 2.00    Types: Cigarettes    Quit date: 11/05/1979    Years since quitting: 42.1   Smokeless tobacco: Never   Tobacco comments:    1/2 pack  a week  Vaping Use   Vaping Use: Never used  Substance and Sexual Activity   Alcohol use: Yes    Alcohol/week: 0.0 standard drinks    Comment: 1 glass of wine weekly   Drug use: No   Sexual activity: Yes    Birth control/protection: Surgical  Other Topics Concern   Not on file  Social History Narrative   Not on file   Social Determinants of Health   Financial Resource Strain: Not on file  Food Insecurity: Not on file  Transportation Needs: Not on file  Physical Activity: Not on file  Stress: Not on file  Social Connections: Not on file  Intimate Partner Violence: Not on file   Family History:  Family History  Problem Relation Age of Onset   Hypertension  Mother    Stroke Mother    Diabetes Mother    Colon cancer Father 13   Hypertension Sister    Colon polyps Sister    Heart disease Brother 7   Diabetes Maternal Uncle    Diabetes Maternal Uncle     Review of Systems: Constitutional: Doesn't report fevers, chills or abnormal weight loss Eyes: Doesn't report blurriness of vision Ears, nose, mouth, throat, and face: Doesn't report sore throat Respiratory: Chest wall pain Cardiovascular: Doesn't report palpitation, chest discomfort  Gastrointestinal:  Doesn't report nausea, constipation, diarrhea GU: Doesn't report incontinence Skin: Doesn't report skin rashes Neurological: Per HPI Musculoskeletal: R shoulder pain, limited ROM Behavioral/Psych: Doesn't report anxiety  Physical Exam: Vitals:   12/03/21 1036  BP: (!) 143/98  Pulse: 87  Resp: 18  Temp: 98.1 F (36.7 C)  SpO2: 98%    KPS: 60. General: Alert, cooperative, pleasant, in no acute distress Head: Normal EENT: No conjunctival injection or scleral icterus.  Lungs: Resp effort normal Cardiac: Regular rate Abdomen: Non-distended abdomen Skin: No rashes cyanosis or petechiae. Extremities: No clubbing or edema  Neurologic Exam: Mental Status: Awake, alert, attentive to examiner. Both receptive  and expressive dysphasia.  Cranial Nerves: Visual acuity is grossly normal. Visual fields are full. Extra-ocular movements intact. No ptosis. Face is symmetric Motor: Tone and bulk are normal. Right leg 3/5, left leg 4/5, proximal greater than distal weakness.  Noted R leg flexor spasticity. Reflexes are symmetric, no pathologic reflexes present.  Sensory: Intact to light touch Gait: Deferred today  Labs: I have reviewed the data as listed    Component Value Date/Time   NA 143 12/03/2021 0959   K 3.2 (L) 12/03/2021 0959   CL 110 12/03/2021 0959   CO2 25 12/03/2021 0959   GLUCOSE 103 (H) 12/03/2021 0959   GLUCOSE 85 10/13/2006 1158   BUN 21 (H) 12/03/2021 0959   CREATININE 0.68 12/03/2021 0959   CALCIUM 9.4 12/03/2021 0959   PROT 6.6 12/03/2021 0959   ALBUMIN 4.0 12/03/2021 0959   AST 15 12/03/2021 0959   ALT 10 12/03/2021 0959   ALKPHOS 46 12/03/2021 0959   BILITOT 0.4 12/03/2021 0959   GFRNONAA >60 12/03/2021 0959   GFRAA >60 02/17/2020 0136   Lab Results  Component Value Date   WBC 3.3 (L) 12/03/2021   NEUTROABS 2.3 12/03/2021   HGB 14.2 12/03/2021   HCT 41.8 12/03/2021   MCV 97.0 12/03/2021   PLT 167 12/03/2021   Imaging:  [facility] Clinician Interpretation: I have personally reviewed the CNS images as listed.  My interpretation, in the context of the patient's clinical presentation, is progressive disease  MR BRAIN W WO CONTRAST  Result Date: 11/30/2021 CLINICAL DATA:  Brain/CNS neoplasm, assess treatment response. Glioblastoma. EXAM: MRI HEAD WITHOUT AND WITH CONTRAST TECHNIQUE: Multiplanar, multiecho pulse sequences of the brain and surrounding structures were obtained without and with intravenous contrast. CONTRAST:  6.82m GADAVIST GADOBUTROL 1 MMOL/ML IV SOLN COMPARISON:  10/03/2021 FINDINGS: Brain: A heterogeneous mass demonstrating restricted diffusion and chronic blood products in the body and genu of the corpus callosum and extending into both cerebral hemispheres  has progressed, most notably anteriorly although the smaller component posteriorly in the corpus callosum has also mildly enlarged. There is increased enhancement along the anterior margin of the mass, most notably on the right. Extensive nonenhancing T2 hyperintensity throughout the white matter both cerebral hemispheres demonstrates mild progression anteriorly in the frontal lobes. There is mild cerebral atrophy. No acute infarct, midline shift,  or extra-axial fluid collection is identified. The ventricles are unchanged in size. Vascular: Major intracranial vascular flow voids are preserved. Skull and upper cervical spine: Left vertex craniotomy. No suspicious marrow lesion. Sinuses/Orbits: Unremarkable orbits. Trace fluid in the sphenoid sinuses. Clear mastoid air cells. Other: None. IMPRESSION: Enlargement of the mass centered in the corpus callosum with increased enhancement along its anterior margin and mildly progressive nonenhancing T2 hyperintensity in the bifrontal white matter. Electronically Signed   By: Logan Bores M.D.   On: 11/30/2021 16:06    Assessment/Plan GBM (glioblastoma multiforme) ([facility])  Seizure disorder ([facility])  Hannah [name] is clinically stable today, now having completed cycle #4 of metronomic Temodar.  MRI brain unfortunately demonstrates pattern of nodular, tumorigenic progression within corpus callosum and adjacent to horn of right lateral ventricle.  Discontinuing Temodar was recommended for failure of therapy.  We then discussed treatment plan options given disease progression, including CCNU, Irinotecan and hospice referral.  She elected to proceed with oral CCNU 54m/m2 q6 weeks, with concurrent avastin 154mkg q2-3 weeks.  We reviewed side effects, including cytopenias, nausea/vomiting, fatigue and lethargy.  She will again pre-treat with Zofran 36m39mo prevent nausea, as with Temodar.   Chemotherapy should be held for the following:  ANC less than 1,000  Platelets  less than 100,000  LFT or creatinine greater than 2x ULN  If clinical concerns/contraindications develop  Will continue treatment with Avastin 58m636m IV q2 weeks.  Avastin will help treat inflammatory processes in the brain, and act as steroid sparing agent.  Avastin should be held for the following:  ANC less than 500  Platelets less than 50,000  LFT or creatinine greater than 2x ULN  If clinical concerns/contraindications develop   She may start the CCNU once it is approved and obtained, hopefully later this week.   Will con't Norvasc 5mg 82mly.  Will con't Vimpat 200/100, Eliquis as prior.   Baclofen con't 5mg q81mRN.  We ask that Hannah [name] return to clinic in 3 weeks following MRI brain for next avastin infusion or sooner if needed.    All questions were answered. The patient knows to call the clinic with any problems, questions or concerns. No barriers to learning were detected.  I have spent a total of 40 minutes of face-to-face and non-face-to-face time, excluding clinical staff time, preparing to see patient, ordering tests and/or medications, counseling the patient, and independently interpreting results and communicating results to the patient/family/caregiver    ZacharVentura Sellersedical Director of Neuro-Oncology [facility]/23 10:42 AM

## 2021-12-04 ENCOUNTER — Telehealth: Payer: Self-pay | Admitting: Internal Medicine

## 2021-12-04 NOTE — Telephone Encounter (Signed)
Scheduled per 1/30 los, pt has been called and husband confirmed appt

## 2021-12-04 NOTE — Telephone Encounter (Signed)
Oral Chemotherapy Pharmacist Encounter  I spoke with patient's husband for overview of: lomustine for the treatment of glioblastoma multiforme in conjunction with bevacizumab, planned duration until disease progression or unacceptable toxicity.  Counseled on administration, dosing, side effects, monitoring, drug-food interactions, safe handling, storage, and disposal.  Patient will take lomustine 10mg  capsules (1 capsule), lomustine 40 mg capsules (1 capsule) and lomustine 100 mg capsules (1 capsule) ( total dose 150mg ) by mouth once daily, may take at bedtime and on an empty stomach to decrease nausea and vomiting.  Lomustine will be administered once every 6 weeks.  Lomustine start date: tentatively 12/05/21, but patient's husband will confirm with office when patient takes dose.    Patient will take Zofran 8mg  tablet, 1 tablet by mouth 30-60 min prior to lomustine dose to help decrease N/V.   Adverse effects include but are not limited to: nausea, vomiting, decreased blood counts, alopecia, hepatotoxicity, and pulmonary toxicity.  Reviewed importance of keeping a medication schedule and plan for any missed doses. No barriers to medication adherence identified.  Medication reconciliation performed and medication/allergy list updated.  Insurance authorization for lomustine has been obtained. Test claim at the pharmacy revealed copayment $0 for 1st fill of Lomustine. This will be delivered to patient's home tomorrow, 12/05/21.  All questions answered.  Mr. Kendrick voiced understanding and appreciation.   Medication education handout placed in mail for patient and family. Patient's family knows to call the office with questions or concerns. Oral Chemotherapy Clinic phone number provided.   Leron Croak, PharmD, BCPS Hematology/Oncology Clinical Pharmacist [facility] [phone] 12/04/2021 4:34 PM

## 2021-12-12 ENCOUNTER — Other Ambulatory Visit (HOSPITAL_BASED_OUTPATIENT_CLINIC_OR_DEPARTMENT_OTHER): Payer: Self-pay

## 2021-12-24 ENCOUNTER — Inpatient Hospital Stay: Payer: 59

## 2021-12-24 ENCOUNTER — Inpatient Hospital Stay: Payer: 59 | Admitting: Internal Medicine

## 2021-12-24 ENCOUNTER — Telehealth: Payer: Self-pay | Admitting: *Deleted

## 2021-12-24 NOTE — Telephone Encounter (Signed)
Patient had very small seizure on 12/14/2021 that lasted maybe 10-15 seconds and another one 12/23/2021 that was more intense in nature.  Dr Mickeal Skinner aware.  No changes at this time.    Per Spouse wants to reschedule infusion for today 12/24/2021.  Scheduling made aware.

## 2021-12-25 ENCOUNTER — Telehealth: Payer: Self-pay | Admitting: Internal Medicine

## 2021-12-25 NOTE — Telephone Encounter (Signed)
.  Called patient to schedule appointment per 2/20 inbasket, patient is aware of date and time.   °

## 2021-12-26 ENCOUNTER — Telehealth: Payer: Self-pay

## 2021-12-26 ENCOUNTER — Other Ambulatory Visit (HOSPITAL_BASED_OUTPATIENT_CLINIC_OR_DEPARTMENT_OTHER): Payer: Self-pay

## 2021-12-26 ENCOUNTER — Other Ambulatory Visit (HOSPITAL_COMMUNITY): Payer: Self-pay

## 2021-12-26 ENCOUNTER — Other Ambulatory Visit: Payer: Self-pay | Admitting: Internal Medicine

## 2021-12-26 MED ORDER — LACOSAMIDE 100 MG PO TABS
ORAL_TABLET | ORAL | 3 refills | Status: DC
  Filled 2021-12-26: qty 90, fill #0
  Filled 2021-12-26: qty 90, 30d supply, fill #0

## 2021-12-26 NOTE — Telephone Encounter (Signed)
Called and spoke with patient's husband about refill for Vimpat. I informed the husband that Dr. Mickeal Skinner sent over a prescription to the [facility]. He verbalized understanding and had no further questions or concerns.

## 2022-01-01 ENCOUNTER — Inpatient Hospital Stay: Payer: 59 | Admitting: Internal Medicine

## 2022-01-01 ENCOUNTER — Other Ambulatory Visit: Payer: Self-pay

## 2022-01-01 ENCOUNTER — Inpatient Hospital Stay: Payer: 59

## 2022-01-01 ENCOUNTER — Inpatient Hospital Stay: Payer: 59 | Attending: Internal Medicine

## 2022-01-01 VITALS — BP 138/98 | HR 90 | Temp 97.7°F | Resp 17

## 2022-01-01 VITALS — BP 139/91

## 2022-01-01 DIAGNOSIS — C719 Malignant neoplasm of brain, unspecified: Secondary | ICD-10-CM

## 2022-01-01 DIAGNOSIS — C711 Malignant neoplasm of frontal lobe: Secondary | ICD-10-CM | POA: Insufficient documentation

## 2022-01-01 DIAGNOSIS — G40909 Epilepsy, unspecified, not intractable, without status epilepticus: Secondary | ICD-10-CM | POA: Diagnosis not present

## 2022-01-01 DIAGNOSIS — Z7901 Long term (current) use of anticoagulants: Secondary | ICD-10-CM | POA: Insufficient documentation

## 2022-01-01 DIAGNOSIS — Z9221 Personal history of antineoplastic chemotherapy: Secondary | ICD-10-CM | POA: Insufficient documentation

## 2022-01-01 DIAGNOSIS — Z86711 Personal history of pulmonary embolism: Secondary | ICD-10-CM | POA: Diagnosis not present

## 2022-01-01 DIAGNOSIS — Z5112 Encounter for antineoplastic immunotherapy: Secondary | ICD-10-CM | POA: Insufficient documentation

## 2022-01-01 DIAGNOSIS — Z79899 Other long term (current) drug therapy: Secondary | ICD-10-CM | POA: Insufficient documentation

## 2022-01-01 DIAGNOSIS — Z87891 Personal history of nicotine dependence: Secondary | ICD-10-CM | POA: Diagnosis not present

## 2022-01-01 DIAGNOSIS — Z923 Personal history of irradiation: Secondary | ICD-10-CM | POA: Insufficient documentation

## 2022-01-01 LAB — CBC WITH DIFFERENTIAL (CANCER CENTER ONLY)
Abs Immature Granulocytes: 0.02 10*3/uL (ref 0.00–0.07)
Basophils Absolute: 0 10*3/uL (ref 0.0–0.1)
Basophils Relative: 1 %
Eosinophils Absolute: 0 10*3/uL (ref 0.0–0.5)
Eosinophils Relative: 1 %
HCT: 41.7 % (ref 36.0–46.0)
Hemoglobin: 14.3 g/dL (ref 12.0–15.0)
Immature Granulocytes: 1 %
Lymphocytes Relative: 18 %
Lymphs Abs: 0.6 10*3/uL — ABNORMAL LOW (ref 0.7–4.0)
MCH: 33.1 pg (ref 26.0–34.0)
MCHC: 34.3 g/dL (ref 30.0–36.0)
MCV: 96.5 fL (ref 80.0–100.0)
Monocytes Absolute: 0.4 10*3/uL (ref 0.1–1.0)
Monocytes Relative: 13 %
Neutro Abs: 2.1 10*3/uL (ref 1.7–7.7)
Neutrophils Relative %: 66 %
Platelet Count: 111 10*3/uL — ABNORMAL LOW (ref 150–400)
RBC: 4.32 MIL/uL (ref 3.87–5.11)
RDW: 12.8 % (ref 11.5–15.5)
WBC Count: 3.2 10*3/uL — ABNORMAL LOW (ref 4.0–10.5)
nRBC: 0 % (ref 0.0–0.2)

## 2022-01-01 LAB — CMP (CANCER CENTER ONLY)
ALT: 10 U/L (ref 0–44)
AST: 15 U/L (ref 15–41)
Albumin: 4.1 g/dL (ref 3.5–5.0)
Alkaline Phosphatase: 55 U/L (ref 38–126)
Anion gap: 9 (ref 5–15)
BUN: 14 mg/dL (ref 6–20)
CO2: 25 mmol/L (ref 22–32)
Calcium: 9.7 mg/dL (ref 8.9–10.3)
Chloride: 106 mmol/L (ref 98–111)
Creatinine: 0.72 mg/dL (ref 0.44–1.00)
GFR, Estimated: >60 mL/min (ref >60)
Glucose, Bld: 120 mg/dL — ABNORMAL HIGH (ref 70–99)
Potassium: 3.8 mmol/L (ref 3.5–5.1)
Sodium: 140 mmol/L (ref 135–145)
Total Bilirubin: 0.5 mg/dL (ref 0.3–1.2)
Total Protein: 7.1 g/dL (ref 6.5–8.1)

## 2022-01-01 LAB — TOTAL PROTEIN, URINE DIPSTICK: Protein, ur: <30 mg/dL — AB

## 2022-01-01 MED ORDER — SODIUM CHLORIDE 0.9 % IV SOLN: Freq: Once | INTRAVENOUS | Status: AC

## 2022-01-01 MED ORDER — SODIUM CHLORIDE 0.9 % IV SOLN
600.0000 mg | Freq: Once | INTRAVENOUS | Status: AC
  Administered 2022-01-01: 600 mg via INTRAVENOUS
  Filled 2022-01-01: qty 16

## 2022-01-01 NOTE — Progress Notes (Signed)
Hannah [name], Hannah [name]   Interval Evaluation  Date of Service: 01/01/22 Patient Name: Hannah [name] Patient MRN: [id number] Patient DOB: 1961-03-04 Provider: [name], MD  Identifying Statement:  Hannah [name] is a 61 y.o. female with left frontal glioblastoma   Oncologic History: Oncology History  GBM (glioblastoma multiforme) ([place])   Initial Diagnosis   GBM (glioblastoma multiforme) ([place])   02/17/2020 Surgery   Craniotomy, left frontal resection by Dr. Marcello Moores.  Path is glioblastoma.   08/28/2020 - 10/09/2020 Radiation Therapy   Radiation and concurrent Temozolomide 40m/m2 daily   11/10/2020 - 12/09/2020 Chemotherapy   Completes 1 cycle of 5-day Temozolomide      [facility]   [facility] for saddle [facility], undergoes thrombectomy and IVC filter placement   03/09/2021 Progression      03/27/2021 -  Chemotherapy   Initiates second line therapy with Avastin 132mkg IV q2 weeks    08/08/2021 - 08/09/2021 Chemotherapy   Patient is on Treatment Plan : BRAIN GLIOBLASTOMA Consolidation Temozolomide Days 1-5 q28 Days      12/03/2021 -  Chemotherapy   Patient is on Treatment Plan : BRAIN Lomustine q42d       Biomarkers:  MGMT Methylated.  IDH 1/2 Wild type.  EGFR Unknown  TERT Unknown   Interval History: Hannah [name] presents today for avastin infusion now having dosed CCNU cycle #1.  No significant changes with ongoing fatigue, lethargy, continues reliance on family for functional support.  Appeared to tolerate the CCNU well.  Continues on Vimpat to 10059m00mg.  Still off the steroids.  Decadron 11/09/20: 2mg57m/10/22: 8mg 19m21/22: 6mg 063m7/22: 4mg 0352m/22: 8mg 04/59m22: 6mg 05/145m2: 3mg 05/2338m: 2mg 06/06/13m 1.5mg 06/20/258m-  H+P (01/20/20) Patient presented one month ago with sudden onset left arm and leg weakness and interrupted speech, c/w  seizure.  She was playing tennis at the time, [name], stroke eval and CNS imaging which demonstrated a non-enhancing left frontal mass.  At this time she is back to baseline without recurrence of events, taking Keppra 500mg twice p34may.  No history or seizure or any other neurologic events.  She does describe being sleep deprived prior to seizure event.  She presents today after one month follow up MRI scan.  Medications: Current Outpatient Medications on File Prior to Visit  Medication Sig Dispense Refill   amLODipine (NORVASC) 5 MG tablet Take 1 tablet (5 mg total) by mouth daily. 30 tablet 3   apixaban (ELIQUIS) 5 MG TABS tablet TAKE 1 TABLET (5 MG TOTAL) BY MOUTH 2 (TWO) TIMES DAILY. 60 tablet 2   Baclofen 5 MG TABS Take 1 tablet (5 mg) by mouth 3 (three) times daily. 90 tablet 3   cholecalciferol (VITAMIN D3) 25 MCG (1000 UNIT) tablet Take 1,000 Units by mouth daily.     ciprofloxacin (CIPRO) 500 MG tablet Take 1 tablet (500 mg total) by mouth 2 (two) times daily. (Patient not taking: Reported on 07/16/2021) 28 tablet 0   docusate sodium (COLACE) 100 MG capsule Take 100 mg by mouth 2 (two) times daily as needed for mild constipation.     Lacosamide 100 MG TABS Take 1 tablet by mouth in the morning (100mg) and 2 t4mts by mouth at bedtime (200mg) 90 table44m  methylphenidate (RITALIN) 5 MG tablet  Take 1 tablet (5 mg total) by mouth 2 (two) times daily. 60 tablet 0   [DISCONTINUED] lamoTRIgine (LAMICTAL) 100 MG tablet Take 1 tablet (100 mg total) by mouth daily. 30 tablet 3   No current facility-administered medications on file prior to visit.    Allergies: No Known Allergies Past Medical History:  Past Medical History:  Diagnosis Date   Cervical spondylolysis 12/15/2019   Skeleton: Mild cervical spondylosis C6-7. Incidental find.    Colon polyp    GBM (glioblastoma multiforme) ([facility])    Left ankle  sprain 10/07/2011   Seizure ([place])    partial    Tick bite 02/2020   Past Surgical History:  Past Surgical History:  Procedure Laterality Date   APPLICATION OF CRANIAL NAVIGATION Left 02/16/2020   Procedure: APPLICATION OF CRANIAL NAVIGATION;  Surgeon: Vallarie Mare, MD;  Location: [facility];  Service: Neurosurgery;  Laterality: Left;   CRANIOTOMY Left 02/16/2020   Procedure: LEFT FRONTAL CRANIOTOMY FOR BRAIN TUMOR;  Surgeon: Vallarie Mare, MD;  Location: [facility];  Service: Neurosurgery;  Laterality: Left;   FOOT SURGERY Left    bone spur - local anesthesia   IR ANGIOGRAM PULMONARY BILATERAL SELECTIVE  12/15/2020   IR ANGIOGRAM SELECTIVE EACH ADDITIONAL VESSEL  12/15/2020   IR ANGIOGRAM SELECTIVE EACH ADDITIONAL VESSEL  12/15/2020   IR IVC FILTER PLMT / S&I /IMG GUID/MOD SED  12/15/2020   IR THROMBECT PRIM MECH INIT (INCLU) MOD SED  12/15/2020   IR THROMBECT PRIM MECH INIT (INCLU) MOD SED  12/15/2020   RADIOLOGY WITH ANESTHESIA N/A 12/15/2020   Procedure: IR WITH ANESTHESIA;  Surgeon: Radiologist, Medication, MD;  Location: [facility];  Service: Radiology;  Laterality: N/A;   TONSILECTOMY/ADENOIDECTOMY WITH MYRINGOTOMY     tonsils and adenoids out only   WISDOM TOOTH EXTRACTION     Social History:  Social History   Socioeconomic History   Marital status: Married    Spouse name: Not on file   Number of children: 3   Years of education: Not on file   Highest education level: Not on file  Occupational History   Not on file  Tobacco Use   Smoking status: Former    Years: 2.00    Types: Cigarettes    Quit date: 11/05/1979    Years since quitting: 42.1   Smokeless tobacco: Never   Tobacco comments:    1/2 pack a week  Vaping Use   Vaping Use: Never used  Substance and Sexual Activity   Alcohol use: Yes    Alcohol/week: 0.0 standard drinks    Comment: 1 glass of wine weekly   Drug use: No   Sexual activity: Yes    Birth control/protection: Surgical  Other Topics Concern   Not on  file  Social History Narrative   Not on file   Social Determinants of Health   Financial Resource Strain: Not on file  Food Insecurity: Not on file  Transportation Needs: Not on file  Physical Activity: Not on file  Stress: Not on file  Social Connections: Not on file  Intimate Partner Violence: Not on file   Family History:  Family History  Problem Relation Age of Onset   Hypertension Mother    Stroke Mother    Diabetes Mother    Colon cancer Father 4   Hypertension Sister    Colon polyps Sister    Heart disease Brother 87   Diabetes Maternal Uncle    Diabetes Maternal Uncle  Review of Systems: Constitutional: Doesn't report fevers, chills or abnormal weight loss Eyes: Doesn't report blurriness of vision Ears, nose, mouth, throat, and face: Doesn't report sore throat Respiratory: Chest wall pain Cardiovascular: Doesn't report palpitation, chest discomfort  Gastrointestinal:  Doesn't report nausea, constipation, diarrhea GU: Doesn't report incontinence Skin: Doesn't report skin rashes Neurological: Per HPI Musculoskeletal: R shoulder pain, limited ROM Behavioral/Psych: Doesn't report anxiety  Physical Exam: Vitals:   01/01/22 1117  BP: (!) 138/98  Pulse: 90  Resp: 17  Temp: 97.7 F (36.5 C)  SpO2: 94%    KPS: 60. General: Alert, cooperative, pleasant, in no acute distress Head: Normal EENT: No conjunctival injection or scleral icterus.  Lungs: Resp effort normal Cardiac: Regular rate Abdomen: Non-distended abdomen Skin: No rashes cyanosis or petechiae. Extremities: No clubbing or edema  Neurologic Exam: Mental Status: Awake, alert, attentive to examiner. Both receptive and expressive dysphasia.  Cranial Nerves: Visual acuity is grossly normal. Visual fields are full. Extra-ocular movements intact. No ptosis. Face is symmetric Motor: Tone and bulk are normal. Right leg 3/5, left leg 4/5, proximal greater than distal weakness.  Noted R leg flexor  spasticity. Reflexes are symmetric, no pathologic reflexes present.  Sensory: Intact to light touch Gait: Deferred today  Labs: I have reviewed the data as listed    Component Value Date/Time   NA 143 12/03/2021 0959   K 3.2 (L) 12/03/2021 0959   CL 110 12/03/2021 0959   CO2 25 12/03/2021 0959   GLUCOSE 103 (H) 12/03/2021 0959   GLUCOSE 85 10/13/2006 1158   BUN 21 (H) 12/03/2021 0959   CREATININE 0.68 12/03/2021 0959   CALCIUM 9.4 12/03/2021 0959   PROT 6.6 12/03/2021 0959   ALBUMIN 4.0 12/03/2021 0959   AST 15 12/03/2021 0959   ALT 10 12/03/2021 0959   ALKPHOS 46 12/03/2021 0959   BILITOT 0.4 12/03/2021 0959   GFRNONAA >60 12/03/2021 0959   GFRAA >60 02/17/2020 0136   Lab Results  Component Value Date   WBC 3.3 (L) 12/03/2021   NEUTROABS 2.3 12/03/2021   HGB 14.2 12/03/2021   HCT 41.8 12/03/2021   MCV 97.0 12/03/2021   PLT 167 12/03/2021    Assessment/Plan GBM (glioblastoma multiforme) (HCC)  Seizure disorder (HCC)  Kevona A Tieszen is clinically stable today, now having dosed 1st cycle of CCNU/avastin.  Labs demonstrate modest thrombocytopenia and proteinuria today, within safe limits for treatment.  Will continue with oral CCNU 53m/m2 q6 weeks, with concurrent avastin 174mkg q2-3 weeks, now day 28/42.  Chemotherapy should be held for the following:  ANC less than 1,000  Platelets less than 100,000  LFT or creatinine greater than 2x ULN  If clinical concerns/contraindications develop  Will continue treatment with Avastin 1060mg IV q2 weeks.  Avastin will help treat inflammatory processes in the brain, and act as steroid sparing agent.  Avastin should be held for the following:  ANC less than 500  Platelets less than 50,000  LFT or creatinine greater than 2x ULN  If clinical concerns/contraindications develop  Will con't Norvasc 5mg81mily.  Will con't Vimpat 200/100, Eliquis as prior.   Baclofen con't 5mg 97mpRN.  We ask that Jazzmen A  Ballin return to clinic in 3 weeks following MRI brain for next avastin infusion, prior to planned cycle #2 CCNU, or sooner if needed.   All questions were answered. The patient knows to call the clinic with any problems, questions or concerns. No barriers to learning were detected.  I have spent  a total of 30 minutes of face-to-face and non-face-to-face time, excluding clinical staff time, preparing to see patient, ordering tests and/or medications, counseling the patient, and independently interpreting results and communicating results to the patient/family/caregiver    [name], MD Medical Director of Neuro-Oncology [facility] 01/01/22 10:55 AM

## 2022-01-01 NOTE — Patient Instructions (Signed)
[facility] MEDICAL ONCOLOGY  Discharge Instructions: °Thank you for choosing [facility] to provide your oncology and hematology care.  ° °If you have a lab appointment with the Cancer Center, please go directly to the Cancer Center and check in at the registration area. °  °Wear comfortable clothing and clothing appropriate for easy access to any Portacath or PICC line.  ° °We strive to give you quality time with your provider. You may need to reschedule your appointment if you arrive late (15 or more minutes).  Arriving late affects you and other patients whose appointments are after yours.  Also, if you miss three or more appointments without notifying the office, you may be dismissed from the clinic at the provider’s discretion.    °  °For prescription refill requests, have your pharmacy contact our office and allow 72 hours for refills to be completed.   ° °Today you received the following chemotherapy and/or immunotherapy agents: Bevacizumab.     °  °To help prevent nausea and vomiting after your treatment, we encourage you to take your nausea medication as directed. ° °BELOW ARE SYMPTOMS THAT SHOULD BE REPORTED IMMEDIATELY: °*FEVER GREATER THAN 100.4 F (38 °C) OR HIGHER °*CHILLS OR SWEATING °*NAUSEA AND VOMITING THAT IS NOT CONTROLLED WITH YOUR NAUSEA MEDICATION °*UNUSUAL SHORTNESS OF BREATH °*UNUSUAL BRUISING OR BLEEDING °*URINARY PROBLEMS (pain or burning when urinating, or frequent urination) °*BOWEL PROBLEMS (unusual diarrhea, constipation, pain near the anus) °TENDERNESS IN MOUTH AND THROAT WITH OR WITHOUT PRESENCE OF ULCERS (sore throat, sores in mouth, or a toothache) °UNUSUAL RASH, SWELLING OR PAIN  °UNUSUAL VAGINAL DISCHARGE OR ITCHING  ° °Items with * indicate a potential emergency and should be followed up as soon as possible or go to the Emergency Department if any problems should occur. ° °Please show the CHEMOTHERAPY ALERT CARD or IMMUNOTHERAPY ALERT CARD at check-in  to the Emergency Department and triage nurse. ° °Should you have questions after your visit or need to cancel or reschedule your appointment, please contact [facility]  Dept: [phone]  and follow the prompts.  Office hours are 8:00 a.m. to 4:30 p.m. Monday - Friday. Please note that voicemails left after 4:00 p.m. may not be returned until the following business day.  We are closed weekends and major holidays. You have access to a nurse at all times for urgent questions. Please call the main number to the clinic Dept: [phone] and follow the prompts. ° ° °For any non-urgent questions, you may also contact your provider using MyChart. We now offer e-Visits for anyone 18 and older to request care online for non-urgent symptoms. For details visit [email or web address]. °  °Also download the MyChart app! Go to the app store, search "MyChart", open the app, select , and log in with your MyChart username and password. ° °Due to Covid, a mask is required upon entering the hospital/clinic. If you do not have a mask, one will be given to you upon arrival. For doctor visits, patients may have 1 support person aged 18 or older with them. For treatment visits, patients cannot have anyone with them due to current Covid guidelines and our immunocompromised population.  ° °

## 2022-01-02 ENCOUNTER — Telehealth: Payer: Self-pay | Admitting: Internal Medicine

## 2022-01-02 NOTE — Telephone Encounter (Signed)
Scheduled per 2/28 los, pt husband has been called and confirmed appt ?

## 2022-01-03 ENCOUNTER — Encounter: Payer: Self-pay | Admitting: Internal Medicine

## 2022-01-04 ENCOUNTER — Other Ambulatory Visit: Payer: Self-pay | Admitting: Internal Medicine

## 2022-01-04 DIAGNOSIS — C719 Malignant neoplasm of brain, unspecified: Secondary | ICD-10-CM

## 2022-01-07 ENCOUNTER — Other Ambulatory Visit: Payer: Self-pay

## 2022-01-07 NOTE — Telephone Encounter (Signed)
Husband Lanny Hurst called. Patient needs refill on Amlodipine 5 mg tabs. Pt has enough tabs to last through 01/10/22.  He reports his new insurance as of 01/02/22 will need this [facility].  ? ?Routed to Dr. Mickeal Skinner ?

## 2022-01-09 ENCOUNTER — Telehealth: Payer: Self-pay

## 2022-01-09 ENCOUNTER — Other Ambulatory Visit: Payer: Self-pay

## 2022-01-09 MED ORDER — AMLODIPINE BESYLATE 5 MG PO TABS: 5.0000 mg | ORAL_TABLET | Freq: Every day | ORAL | 3 refills | Status: DC

## 2022-01-09 NOTE — Telephone Encounter (Signed)
Husband called to say that his insurance had changed. Pt's Norvasc prescription needed refilling and he requested that the [facility] [place], [place]. E-script sent and husband notified. ?

## 2022-01-14 ENCOUNTER — Other Ambulatory Visit: Payer: Self-pay | Admitting: *Deleted

## 2022-01-14 DIAGNOSIS — C719 Malignant neoplasm of brain, unspecified: Secondary | ICD-10-CM

## 2022-01-16 ENCOUNTER — Encounter: Payer: Self-pay | Admitting: Internal Medicine

## 2022-01-21 ENCOUNTER — Other Ambulatory Visit: Payer: Self-pay | Admitting: Internal Medicine

## 2022-01-21 ENCOUNTER — Telehealth: Payer: Self-pay | Admitting: *Deleted

## 2022-01-21 MED ORDER — LACOSAMIDE 100 MG PO TABS: ORAL_TABLET | ORAL | 3 refills | Status: DC

## 2022-01-21 NOTE — Telephone Encounter (Signed)
Patients spouse called to request refill of Vimpat (lacosamide 100 am and 200 pm). ? ?Requesting this be routed to new pharmacy per their new insurance.  [facility] [place]. ?

## 2022-01-22 ENCOUNTER — Other Ambulatory Visit: Payer: Self-pay

## 2022-01-22 ENCOUNTER — Inpatient Hospital Stay: Payer: BC Managed Care – PPO | Admitting: Internal Medicine

## 2022-01-22 ENCOUNTER — Inpatient Hospital Stay: Payer: BC Managed Care – PPO | Attending: Internal Medicine

## 2022-01-22 ENCOUNTER — Other Ambulatory Visit: Payer: Self-pay | Admitting: Radiation Therapy

## 2022-01-22 ENCOUNTER — Inpatient Hospital Stay: Payer: BC Managed Care – PPO

## 2022-01-22 VITALS — BP 127/93 | HR 81 | Temp 97.7°F

## 2022-01-22 DIAGNOSIS — C719 Malignant neoplasm of brain, unspecified: Secondary | ICD-10-CM

## 2022-01-22 DIAGNOSIS — Z87891 Personal history of nicotine dependence: Secondary | ICD-10-CM | POA: Diagnosis not present

## 2022-01-22 DIAGNOSIS — D709 Neutropenia, unspecified: Secondary | ICD-10-CM | POA: Insufficient documentation

## 2022-01-22 DIAGNOSIS — G40909 Epilepsy, unspecified, not intractable, without status epilepticus: Secondary | ICD-10-CM | POA: Insufficient documentation

## 2022-01-22 DIAGNOSIS — C711 Malignant neoplasm of frontal lobe: Secondary | ICD-10-CM | POA: Diagnosis present

## 2022-01-22 DIAGNOSIS — Z79630 Long term (current) use of alkylating agent: Secondary | ICD-10-CM | POA: Diagnosis not present

## 2022-01-22 DIAGNOSIS — Z7901 Long term (current) use of anticoagulants: Secondary | ICD-10-CM | POA: Insufficient documentation

## 2022-01-22 DIAGNOSIS — Z5112 Encounter for antineoplastic immunotherapy: Secondary | ICD-10-CM | POA: Diagnosis present

## 2022-01-22 DIAGNOSIS — Z9221 Personal history of antineoplastic chemotherapy: Secondary | ICD-10-CM | POA: Insufficient documentation

## 2022-01-22 DIAGNOSIS — Z79899 Other long term (current) drug therapy: Secondary | ICD-10-CM | POA: Diagnosis not present

## 2022-01-22 DIAGNOSIS — Z923 Personal history of irradiation: Secondary | ICD-10-CM | POA: Insufficient documentation

## 2022-01-22 DIAGNOSIS — D696 Thrombocytopenia, unspecified: Secondary | ICD-10-CM | POA: Diagnosis not present

## 2022-01-22 LAB — CBC WITH DIFFERENTIAL (CANCER CENTER ONLY)
Abs Immature Granulocytes: 0 10*3/uL (ref 0.00–0.07)
Basophils Absolute: 0 10*3/uL (ref 0.0–0.1)
Basophils Relative: 1 %
Eosinophils Absolute: 0 10*3/uL (ref 0.0–0.5)
Eosinophils Relative: 2 %
HCT: 40.3 % (ref 36.0–46.0)
Hemoglobin: 13.9 g/dL (ref 12.0–15.0)
Immature Granulocytes: 0 %
Lymphocytes Relative: 27 %
Lymphs Abs: 0.5 10*3/uL — ABNORMAL LOW (ref 0.7–4.0)
MCH: 33.7 pg (ref 26.0–34.0)
MCHC: 34.5 g/dL (ref 30.0–36.0)
MCV: 97.8 fL (ref 80.0–100.0)
Monocytes Absolute: 0.3 10*3/uL (ref 0.1–1.0)
Monocytes Relative: 14 %
Neutro Abs: 1.2 10*3/uL — ABNORMAL LOW (ref 1.7–7.7)
Neutrophils Relative %: 56 %
Platelet Count: 189 10*3/uL (ref 150–400)
RBC: 4.12 MIL/uL (ref 3.87–5.11)
RDW: 13.2 % (ref 11.5–15.5)
WBC Count: 2 10*3/uL — ABNORMAL LOW (ref 4.0–10.5)
nRBC: 0 % (ref 0.0–0.2)

## 2022-01-22 LAB — TOTAL PROTEIN, URINE DIPSTICK: Protein, ur: NEGATIVE mg/dL

## 2022-01-22 LAB — CMP (CANCER CENTER ONLY)
ALT: 10 U/L (ref 0–44)
AST: 16 U/L (ref 15–41)
Albumin: 4.1 g/dL (ref 3.5–5.0)
Alkaline Phosphatase: 48 U/L (ref 38–126)
Anion gap: 9 (ref 5–15)
BUN: 10 mg/dL (ref 8–23)
CO2: 25 mmol/L (ref 22–32)
Calcium: 9.8 mg/dL (ref 8.9–10.3)
Chloride: 105 mmol/L (ref 98–111)
Creatinine: 0.63 mg/dL (ref 0.44–1.00)
GFR, Estimated: >60 mL/min (ref >60)
Glucose, Bld: 123 mg/dL — ABNORMAL HIGH (ref 70–99)
Potassium: 3.8 mmol/L (ref 3.5–5.1)
Sodium: 139 mmol/L (ref 135–145)
Total Bilirubin: 0.3 mg/dL (ref 0.3–1.2)
Total Protein: 7 g/dL (ref 6.5–8.1)

## 2022-01-22 MED ORDER — SODIUM CHLORIDE 0.9 % IV SOLN
600.0000 mg | Freq: Once | INTRAVENOUS | Status: AC
  Administered 2022-01-22: 600 mg via INTRAVENOUS
  Filled 2022-01-22: qty 16

## 2022-01-22 MED ORDER — SODIUM CHLORIDE 0.9 % IV SOLN: Freq: Once | INTRAVENOUS | Status: AC

## 2022-01-22 NOTE — Progress Notes (Signed)
? ?[facility] [address]  ?[place], [place] [address] ?[phone] ? ? ?Interval Evaluation ? ?Date of Service: 01/22/22 ?Patient Name: Hannah [name] ?Patient MRN: [id number] ?Patient DOB: 1961-05-06 ?Provider: [name], MD ? ?Identifying Statement:  ?Hannah [name] is a 61 y.o. female with left frontal glioblastoma  ? ?Oncologic History: ?Oncology History  ?GBM (glioblastoma multiforme) ([place])  ? Initial Diagnosis  ? GBM (glioblastoma multiforme) ([place]) ?  ?02/17/2020 Surgery  ? Craniotomy, left frontal resection by Dr. Marcello Moores.  Path is glioblastoma. ?  ?08/28/2020 - 10/09/2020 Radiation Therapy  ? Radiation and concurrent Temozolomide 50m/m2 daily ?  ?11/10/2020 - 12/09/2020 Chemotherapy  ? Completes 1 cycle of 5-day Temozolomide ?  ? ?  [facility] for [facility], undergoes thrombectomy and IVC filter placement ?  ?03/09/2021 Progression  ?  ?  ?03/27/2021 -  Chemotherapy  ? Initiates second line therapy with Avastin 127mkg IV q2 weeks ? ?  ?08/08/2021 - 08/09/2021 Chemotherapy  ? Patient is on Treatment Plan : BRAIN GLIOBLASTOMA Consolidation Temozolomide Days 1-5 q28 Days   ?   ?12/03/2021 -  Chemotherapy  ? Patient is on Treatment Plan : BRAIN Lomustine q42d  ?   ? ? ?Biomarkers: ? ?MGMT Methylated.  ?IDH 1/2 Wild type.  ?EGFR Unknown  ?TERT Unknown  ? ?Interval History: ?Hannah [name] presents today for avastin infusion now having completed CCNU cycle #1.  No significant changes with ongoing fatigue, lethargy, continues reliance on family for functional support.  No new or progressive complaints today.  Continues on Vimpat to 10069m00mg.  Still off the steroids. ? ?Decadron ?11/09/20: 2mg69m2/10/22: 8mg 22m/21/22: 6mg ?57m07/22: 4mg ?058m9/22: 8mg ?0425m/22: 6mg ?05/6m22: 3mg ?05/212m2: 2mg ?06/0656m: 1.5mg ?06/20/17m - ? ?H+P (01/20/20) Patient presented one month ago with sudden onset left arm and leg weakness and interrupted  speech, [name], [name], stroke eval and CNS imaging which demonstrated a non-enhancing left frontal mass.  At this time she is back to baseline without recurrence of events, taking Keppra 500mg twice p53may.  No history or seizure or any other neurologic events.  She does describe being sleep deprived prior to seizure event.  She presents today after one month follow up MRI scan. ? ?Medications: ?Current Outpatient Medications on File Prior to Visit  ?Medication Sig Dispense Refill  ? amLODipine (NORVASC) 5 MG tablet Take 1 tablet (5 mg total) by mouth daily. 30 tablet 3  ? apixaban (ELIQUIS) 5 MG TABS tablet TAKE 1 TABLET (5 MG TOTAL) BY MOUTH 2 (TWO) TIMES DAILY. 60 tablet 2  ? Baclofen 5 MG TABS Take 1 tablet (5 mg) by mouth 3 (three) times daily. 90 tablet 3  ? cholecalciferol (VITAMIN D3) 25 MCG (1000 UNIT) tablet Take 1,000 Units by mouth daily.    ? docusate sodium (COLACE) 100 MG capsule Take 100 mg by mouth 2 (two) times daily as needed for mild constipation.    ? Lacosamide 100 MG TABS Take 1 tablet by mouth in the morning (100mg) and 2 t77mts by mouth at bedtime (200mg) 90 table36m ? methylphenidate (RITALIN) 5 MG tablet Take 1 tablet (5 mg total) by mouth 2 (two) times daily. (Patient not taking: Reported on 01/01/2022) 60 tablet 0  ? [DISCONTINUED] lamoTRIgine (LAMICTAL) 100 MG  tablet Take 1 tablet (100 mg total) by mouth daily. 30 tablet 3  ? ?No current facility-administered medications on file prior to visit.  ? ? ?Allergies: No Known Allergies ?Past Medical History:  ?Past Medical History:  ?Diagnosis Date  ? Cervical spondylolysis 12/15/2019  ? Skeleton: Mild cervical spondylosis C6-7. Incidental find.   ? Colon polyp   ? GBM (glioblastoma multiforme) ([place])   ? Left ankle sprain 10/07/2011  ? Seizure ([place])   ? partial   ? Tick bite 02/2020  ? ?Past Surgical History:  ?Past  Surgical History:  ?Procedure Laterality Date  ? APPLICATION OF CRANIAL NAVIGATION Left 02/16/2020  ? Procedure: APPLICATION OF CRANIAL NAVIGATION;  Surgeon: Vallarie Mare, MD;  Location: [facility];  Service: Neurosurgery;  Laterality: Left;  ? CRANIOTOMY Left 02/16/2020  ? Procedure: LEFT FRONTAL CRANIOTOMY FOR BRAIN TUMOR;  Surgeon: Vallarie Mare, MD;  Location: [facility];  Service: Neurosurgery;  Laterality: Left;  ? FOOT SURGERY Left   ? bone spur - local anesthesia  ? IR ANGIOGRAM PULMONARY BILATERAL SELECTIVE  12/15/2020  ? IR ANGIOGRAM SELECTIVE EACH ADDITIONAL VESSEL  12/15/2020  ? IR ANGIOGRAM SELECTIVE EACH ADDITIONAL VESSEL  12/15/2020  ? IR IVC FILTER PLMT / S&I /IMG GUID/MOD SED  12/15/2020  ? IR THROMBECT PRIM MECH INIT (INCLU) MOD SED  12/15/2020  ? IR THROMBECT PRIM MECH INIT (INCLU) MOD SED  12/15/2020  ? RADIOLOGY WITH ANESTHESIA N/A 12/15/2020  ? Procedure: IR WITH ANESTHESIA;  Surgeon: Radiologist, Medication, MD;  Location: [facility];  Service: Radiology;  Laterality: N/A;  ? TONSILECTOMY/ADENOIDECTOMY WITH MYRINGOTOMY    ? tonsils and adenoids out only  ? WISDOM TOOTH EXTRACTION    ? ?Social History:  ?Social History  ? ?Socioeconomic History  ? Marital status: Married  ?  Spouse name: Not on file  ? Number of children: 3  ? Years of education: Not on file  ? Highest education level: Not on file  ?Occupational History  ? Not on file  ?Tobacco Use  ? Smoking status: Former  ?  Years: 2.00  ?  Types: Cigarettes  ?  Quit date: 11/05/1979  ?  Years since quitting: 42.2  ? Smokeless tobacco: Never  ? Tobacco comments:  ?  1/2 pack a week  ?Vaping Use  ? Vaping Use: Never used  ?Substance and Sexual Activity  ? Alcohol use: Yes  ?  Alcohol/week: 0.0 standard drinks  ?  Comment: 1 glass of wine weekly  ? Drug use: No  ? Sexual activity: Yes  ?  Birth control/protection: Surgical  ?Other Topics Concern  ? Not on file  ?Social History Narrative  ? Not on file  ? ?Social Determinants of Health  ? ?Financial Resource  Strain: Not on file  ?Food Insecurity: Not on file  ?Transportation Needs: Not on file  ?Physical Activity: Not on file  ?Stress: Not on file  ?Social Connections: Not on file  ?Intimate Partner Violence: Not on file  ? ?Family History:  ?Family History  ?Problem Relation Age of Onset  ? Hypertension Mother   ? Stroke Mother   ? Diabetes Mother   ? Colon cancer Father 60  ? Hypertension Sister   ? Colon polyps Sister   ? Heart disease Brother 26  ? Diabetes Maternal Uncle   ? Diabetes Maternal Uncle   ? ? ?Review of Systems: ?Constitutional: Doesn't report fevers, chills or abnormal weight loss ?Eyes: Doesn't report blurriness of vision ?Ears, nose, mouth, throat,  and face: Doesn't report sore throat ?Respiratory: Chest wall pain ?Cardiovascular: Doesn't report palpitation, chest discomfort  ?Gastrointestinal:  Doesn't report nausea, constipation, diarrhea ?GU: Doesn't report incontinence ?Skin: Doesn't report skin rashes ?Neurological: Per HPI ?Musculoskeletal: normal ?Behavioral/Psych: Doesn't report anxiety ? ?Physical Exam: ?Vitals:  ? 01/22/22 1031  ?BP: (!) 127/93  ?Pulse: 81  ?Temp: 97.7 ?F (36.5 ?C)  ?SpO2: 99%  ? ? ?KPS: 60. ?General: Alert, cooperative, pleasant, in no acute distress ?Head: Normal ?EENT: No conjunctival injection or scleral icterus.  ?Lungs: Resp effort normal ?Cardiac: Regular rate ?Abdomen: Non-distended abdomen ?Skin: No rashes cyanosis or petechiae. ?Extremities: No clubbing or edema ? ?Neurologic Exam: ?Mental Status: Awake, alert, attentive to examiner. Both receptive and expressive dysphasia.  ?Cranial Nerves: Visual acuity is grossly normal. Visual fields are full. Extra-ocular movements intact. No ptosis. Face is symmetric ?Motor: Tone and bulk are normal. Right leg 3/5, left leg 4/5, proximal greater than distal weakness.  Noted R leg flexor spasticity. Reflexes are symmetric, no pathologic reflexes present.  ?Sensory: Intact to light touch ?Gait: Deferred today ? ?Labs: ?I have  reviewed the data as listed ?   ?Component Value Date/Time  ? NA 140 01/01/2022 1042  ? K 3.8 01/01/2022 1042  ? CL 106 01/01/2022 1042  ? CO2 25 01/01/2022 1042  ? GLUCOSE 120 (H) 01/01/2022 1042  ? GLUCOS

## 2022-01-22 NOTE — Progress Notes (Signed)
Per Dr Mickeal Skinner patient is approved for Avastin today 01/22/2022 with current labs and vital signs. ?

## 2022-01-22 NOTE — Patient Instructions (Signed)
[facility]  Discharge Instructions: °Thank you for choosing [facility] to provide your oncology and hematology care.  ° °If you have a lab appointment with the Cancer Center, please go directly to the Cancer Center and check in at the registration area. °  °Wear comfortable clothing and clothing appropriate for easy access to any Portacath or PICC line.  ° °We strive to give you quality time with your provider. You may need to reschedule your appointment if you arrive late (15 or more minutes).  Arriving late affects you and other patients whose appointments are after yours.  Also, if you miss three or more appointments without notifying the office, you may be dismissed from the clinic at the provider’s discretion.    °  °For prescription refill requests, have your pharmacy contact our office and allow 72 hours for refills to be completed.   ° °Today you received the following chemotherapy and/or immunotherapy agents: bevacizumab    °  °To help prevent nausea and vomiting after your treatment, we encourage you to take your nausea medication as directed. ° °BELOW ARE SYMPTOMS THAT SHOULD BE REPORTED IMMEDIATELY: °*FEVER GREATER THAN 100.4 F (38 °C) OR HIGHER °*CHILLS OR SWEATING °*NAUSEA AND VOMITING THAT IS NOT CONTROLLED WITH YOUR NAUSEA MEDICATION °*UNUSUAL SHORTNESS OF BREATH °*UNUSUAL BRUISING OR BLEEDING °*URINARY PROBLEMS (pain or burning when urinating, or frequent urination) °*BOWEL PROBLEMS (unusual diarrhea, constipation, pain near the anus) °TENDERNESS IN MOUTH AND THROAT WITH OR WITHOUT PRESENCE OF ULCERS (sore throat, sores in mouth, or a toothache) °UNUSUAL RASH, SWELLING OR PAIN  °UNUSUAL VAGINAL DISCHARGE OR ITCHING  ° °Items with * indicate a potential emergency and should be followed up as soon as possible or go to the Emergency Department if any problems should occur. ° °Please show the CHEMOTHERAPY ALERT CARD or IMMUNOTHERAPY ALERT CARD at check-in  to the Emergency Department and triage nurse. ° °Should you have questions after your visit or need to cancel or reschedule your appointment, please contact [facility]  Dept: [phone]  and follow the prompts.  Office hours are 8:00 a.m. to 4:30 p.m. Monday - Friday. Please note that voicemails left after 4:00 p.m. may not be returned until the following business day.  We are closed weekends and major holidays. You have access to a nurse at all times for urgent questions. Please call the main number to the clinic Dept: [phone] and follow the prompts. ° ° °For any non-urgent questions, you may also contact your provider using MyChart. We now offer e-Visits for anyone 18 and older to request care online for non-urgent symptoms. For details visit [email or web address]. °  °Also download the MyChart app! Go to the app store, search "MyChart", open the app, select [facility], and log in with your MyChart username and password. ° °Due to Covid, a mask is required upon entering the hospital/clinic. If you do not have a mask, one will be given to you upon arrival. For doctor visits, patients may have 1 support person aged 18 or older with them. For treatment visits, patients cannot have anyone with them due to current Covid guidelines and our immunocompromised population.  ° °

## 2022-01-26 ENCOUNTER — Ambulatory Visit (HOSPITAL_COMMUNITY)
Admission: RE | Admit: 2022-01-26 | Discharge: 2022-01-26 | Disposition: A | Discharge status: Home / Self Care | Payer: BC Managed Care – PPO | Source: Ambulatory Visit | Attending: Internal Medicine | Admitting: Internal Medicine

## 2022-01-26 DIAGNOSIS — C719 Malignant neoplasm of brain, unspecified: Secondary | ICD-10-CM | POA: Insufficient documentation

## 2022-01-26 MED ORDER — GADOBUTROL 1 MMOL/ML IV SOLN
6.5000 mL | Freq: Once | INTRAVENOUS | Status: AC | PRN
  Administered 2022-01-26: 6.5 mL via INTRAVENOUS

## 2022-01-28 ENCOUNTER — Other Ambulatory Visit: Payer: Self-pay

## 2022-01-28 ENCOUNTER — Other Ambulatory Visit (HOSPITAL_COMMUNITY): Payer: Self-pay

## 2022-01-28 ENCOUNTER — Inpatient Hospital Stay (HOSPITAL_BASED_OUTPATIENT_CLINIC_OR_DEPARTMENT_OTHER): Payer: BC Managed Care – PPO | Admitting: Internal Medicine

## 2022-01-28 ENCOUNTER — Inpatient Hospital Stay: Payer: BC Managed Care – PPO

## 2022-01-28 ENCOUNTER — Encounter: Payer: Self-pay | Admitting: Internal Medicine

## 2022-01-28 DIAGNOSIS — G40909 Epilepsy, unspecified, not intractable, without status epilepticus: Secondary | ICD-10-CM

## 2022-01-28 DIAGNOSIS — C719 Malignant neoplasm of brain, unspecified: Secondary | ICD-10-CM | POA: Diagnosis not present

## 2022-01-28 DIAGNOSIS — Z515 Encounter for palliative care: Secondary | ICD-10-CM

## 2022-01-28 MED ORDER — LOMUSTINE 100 MG PO CAPS
100.0000 mg | ORAL_CAPSULE | Freq: Once | ORAL | 0 refills | Status: AC
  Filled 2022-01-28 (×2): qty 1, 1d supply, fill #0

## 2022-01-28 MED ORDER — LOMUSTINE 40 MG PO CAPS
40.0000 mg | ORAL_CAPSULE | Freq: Once | ORAL | 0 refills | Status: AC
  Filled 2022-01-28 (×2): qty 1, 1d supply, fill #0

## 2022-01-28 MED ORDER — LOMUSTINE 10 MG PO CAPS
10.0000 mg | ORAL_CAPSULE | Freq: Once | ORAL | 0 refills | Status: AC
  Filled 2022-01-28 (×2): qty 1, 1d supply, fill #0

## 2022-01-28 NOTE — Progress Notes (Signed)
I connected with Hannah [name] on 01/28/22 at 10:00 AM EDT by telephone visit and verified that I am speaking with the correct person using two identifiers.  ?I discussed the limitations, risks, security and privacy concerns of performing an evaluation and management service by telemedicine and the availability of in-person appointments. I also discussed with the patient that there may be a patient responsible charge related to this service. The patient expressed understanding and agreed to proceed.  ?Other persons participating in the visit and their role in the encounter:  spouse  ? ?Patient's location:  Home  ?Provider's location:  Office  ?Chief Complaint:  GBM (glioblastoma multiforme) ([place]) - Plan: MR BRAIN W WO CONTRAST, lomustine (GLEOSTINE) 10 MG capsule, lomustine (GLEOSTINE) 40 MG capsule, lomustine (GLEOSTINE) 100 MG capsule ? ?Seizure disorder ([place]) ? ?History of Present Ilness: Hannah [name] describes no clinical changes.  Had a good weekend overall with improved energy, but more restful today so far.  No seizures.  MRI went much better at [facility] than prior location. ?Observations: Language and cognition at baseline ? ?Imaging: ? ?[facility] Clinician Interpretation: I have personally reviewed the CNS images as listed.  My interpretation, in the context of the patient's clinical presentation, is stable disease ? ?MR Brain W Wo Contrast ? ?Result Date: 01/28/2022 ?CLINICAL DATA:  Glioblastoma follow-up EXAM: MRI HEAD WITHOUT AND WITH CONTRAST TECHNIQUE: Multiplanar, multiecho pulse sequences of the brain and surrounding structures were obtained without and with intravenous contrast. CONTRAST:  6.59m GADAVIST GADOBUTROL 1 MMOL/ML IV SOLN COMPARISON:  11/29/2021 FINDINGS: Brain: Infiltrating mass centered at the corpus callosum with mineralization and chronic blood products. Stable changes from left frontal approach biopsy. Low-grade enhancement considering tumor type and intrinsic T1 signal. The  degree of masslike thickening and restricted diffusion centered on the lateral ventricles is unchanged. Pattern of mineralization appears similar although possibly slightly more prominent than before. No progressive enhancement, T2 signal, or swelling. No complicating infarct, hydrocephalus, or collection. Vascular: Preserved flow voids and vascular enhancements Skull and upper cervical spine: Normal marrow signal. Unremarkable craniotomy Sinuses/Orbits: Negative IMPRESSION: Interval stability of the patient's glioblastoma. Electronically Signed   By: JJorje GuildM.D.   On: 01/28/2022 04:26   ? ?Assessment and Plan: GBM (glioblastoma multiforme) ([place] - Plan: MR BRAIN W WO CONTRAST, lomustine (GLEOSTINE) 10 MG capsule, lomustine (GLEOSTINE) 40 MG capsule, lomustine (GLEOSTINE) 100 MG capsule ? ?Seizure disorder ([place] ? ?Hannah [name] is clinically and radiographically stable today.  We recommended continuing with cycle #2 CCNU '90mg'$  PO q6 weeks with q3 week avastin as discussed. ? ?Chemotherapy should be held for the following: ?? ANC less than 1,000 ?? Platelets less than 100,000 ?? LFT or creatinine greater than 2x ULN ?? If clinical concerns/contraindications develop ? ?Follow Up Instructions: RTC 02/12/22 for next avastin infusion. ?  ?I discussed the assessment and treatment plan with the patient.  The patient was provided an opportunity to ask questions and all were answered.  The patient agreed with the plan and demonstrated understanding of the instructions.   ? ?The patient was advised to call back or seek an in-person evaluation if the symptoms worsen or if the condition fails to improve as anticipated.  I provided 5-10 minutes of non-face-to-face time during this enocunter. ? ?ZVentura Sellers MD ? ? ?I provided 22 minutes of non face-to-face telephone visit time during this encounter, and > 50% was spent counseling as documented under my assessment & plan.  ? ?

## 2022-01-29 ENCOUNTER — Other Ambulatory Visit (HOSPITAL_COMMUNITY): Payer: Self-pay

## 2022-01-30 ENCOUNTER — Other Ambulatory Visit (HOSPITAL_COMMUNITY): Payer: Self-pay

## 2022-01-31 NOTE — Progress Notes (Signed)
COMMUNITY PALLIATIVE CARE SW NOTE ? ?PATIENT NAME: Hannah [name] ?DOB: 20-Jun-1961 ?MRN: [id number] ? ?PRIMARY CARE PROVIDER: [name], DO ? ?RESPONSIBLE PARTY:  ?Acct ID - Guarantor Home Phone Work Phone Relationship Acct Type  ?[id number] JUELZ, CLAAR* [phone]  Self P/F  ?   [place] [address], [place], [place] [address]  ? ?Due to the COVID-19 crisis, this virtual check-in visit was done via telephone from my office and it was initiated and consent by this patient and or family. ? ?SOCIAL WORK TELEPHONIC ENCOUNTER ? ?PC SW completed a telephonic encounter with patient's husband-Keith to assess patient status and needs. Lanny Hurst advised that patient status remains stable. Patient's recent scan indicated that her tumor has not grown. Patient appears stiffer overall. She does holler out in pain with transfers tot he hospital. She is currently not taking anything for pain as pain only occurs when she goes to the bathroom and once patient is back to her recliner she fine. Her appetite remains good, but she is needing increased cueing to swallow food and medications. Patient is having increased periods where it takes several days before bowels will move. Her husband advise that he will use an over the counter laxative with patient which generally works. Patient's husband and daughter provide care for patient and the goal is to keep her at home. Lanny Hurst is open to ongoing palliative care support. He would like the NP to follow-up with patient at her earliest available appointment.  ?NP updated for follow-up.  ? ?Katheren Puller, LCSW ? ?

## 2022-01-31 NOTE — Progress Notes (Deleted)
? ?Established Patient Office Visit ? ?Subjective: N/A  ? ?******WRONG NOTE  WRONG NOTE  WRONG NOTE*****N/A ******WRONG TEMPLATE ? ?Patient ID: Hannah [name], female    DOB: [birth year]  Age: 61 y.o. MRN: [id number] ? ?CC: No chief complaint on file. ? ? ?HPI ?Hannah [name] presents for N/A ? ?Past Medical History:  ?Diagnosis Date  ? Cervical spondylolysis 12/15/2019  ? Skeleton: Mild cervical spondylosis C6-7. Incidental find.   ? Colon polyp   ? GBM (glioblastoma multiforme) ([place])   ? Left ankle sprain 10/07/2011  ? Seizure ([place])   ? partial   ? Tick bite 02/2020  ? ? ?Past Surgical History:  ?Procedure Laterality Date  ? APPLICATION OF CRANIAL NAVIGATION Left 02/16/2020  ? Procedure: APPLICATION OF CRANIAL NAVIGATION;  Surgeon: Vallarie Mare, MD;  Location: [facility];  Service: Neurosurgery;  Laterality: Left;  ? CRANIOTOMY Left 02/16/2020  ? Procedure: LEFT FRONTAL CRANIOTOMY FOR BRAIN TUMOR;  Surgeon: Vallarie Mare, MD;  Location: [facility];  Service: Neurosurgery;  Laterality: Left;  ? FOOT SURGERY Left   ? bone spur - local anesthesia  ? IR ANGIOGRAM PULMONARY BILATERAL SELECTIVE  12/15/2020  ? IR ANGIOGRAM SELECTIVE EACH ADDITIONAL VESSEL  12/15/2020  ? IR ANGIOGRAM SELECTIVE EACH ADDITIONAL VESSEL  12/15/2020  ? IR IVC FILTER PLMT / S&I /IMG GUID/MOD SED  12/15/2020  ? IR THROMBECT PRIM MECH INIT (INCLU) MOD SED  12/15/2020  ? IR THROMBECT PRIM MECH INIT (INCLU) MOD SED  12/15/2020  ? RADIOLOGY WITH ANESTHESIA N/A 12/15/2020  ? Procedure: IR WITH ANESTHESIA;  Surgeon: Radiologist, Medication, MD;  Location: [facility];  Service: Radiology;  Laterality: N/A;  ? TONSILECTOMY/ADENOIDECTOMY WITH MYRINGOTOMY    ? tonsils and adenoids out only  ? WISDOM TOOTH EXTRACTION    ? ? ?Family History  ?Problem Relation Age of Onset  ? Hypertension Mother   ? Stroke Mother   ? Diabetes Mother   ? Colon cancer Father 55  ? Hypertension Sister   ? Colon polyps Sister   ? Heart disease Brother 62  ? Diabetes Maternal Uncle   ?  Diabetes Maternal Uncle   ? ? ?Social History  ? ?Socioeconomic History  ? Marital status: Married  ?  Spouse name: Not on file  ? Number of children: 3  ? Years of education: Not on file  ? Highest education level: Not on file  ?Occupational History  ? Not on file  ?Tobacco Use  ? Smoking status: Former  ?  Years: 2.00  ?  Types: Cigarettes  ?  Quit date: 11/05/1979  ?  Years since quitting: 42.2  ? Smokeless tobacco: Never  ? Tobacco comments:  ?  1/2 pack a week  ?Vaping Use  ? Vaping Use: Never used  ?Substance and Sexual Activity  ? Alcohol use: Yes  ?  Alcohol/week: 0.0 standard drinks  ?  Comment: 1 glass of wine weekly  ? Drug use: No  ? Sexual activity: Yes  ?  Birth control/protection: Surgical  ?Other Topics Concern  ? Not on file  ?Social History Narrative  ? Not on file  ? ?Social Determinants of Health  ? ?Financial Resource Strain: Not on file  ?Food Insecurity: Not on file  ?Transportation Needs: Not on file  ?Physical Activity: Not on file  ?Stress: Not on file  ?Social Connections: Not on file  ?Intimate Partner Violence: Not on file  ? ? ?Outpatient Medications Prior to Visit  ?Medication Sig Dispense Refill  ?  amLODipine (NORVASC) 5 MG tablet Take 1 tablet (5 mg total) by mouth daily. 30 tablet 3  ? apixaban (ELIQUIS) 5 MG TABS tablet TAKE 1 TABLET (5 MG TOTAL) BY MOUTH 2 (TWO) TIMES DAILY. 60 tablet 2  ? Baclofen 5 MG TABS Take 1 tablet (5 mg) by mouth 3 (three) times daily. 90 tablet 3  ? cholecalciferol (VITAMIN D3) 25 MCG (1000 UNIT) tablet Take 1,000 Units by mouth daily.    ? docusate sodium (COLACE) 100 MG capsule Take 100 mg by mouth 2 (two) times daily as needed for mild constipation.    ? Lacosamide 100 MG TABS Take 1 tablet by mouth in the morning ('100mg'$ ) and 2 tablets by mouth at bedtime ('200mg'$ ) 90 tablet 3  ? lomustine (GLEOSTINE) 10 MG capsule Take 1 capsule (10 mg total) by mouth once for 1 dose. Take on an empty stomach 1 hour before or 2 hours after meals. Caution:chemotherapy. 1  capsule 0  ? lomustine (GLEOSTINE) 100 MG capsule Take 1 capsule (100 mg total) by mouth once for 1 dose. Take on an emtpy stomach 1 hour before or 2 hours after meals. Caution:chemotherapy 1 capsule 0  ? lomustine (GLEOSTINE) 40 MG capsule Take 1 capsule (40 mg total) by mouth once for 1 dose. Take on an empty stomach 1 hour before or 2 hours after meals. Caution:chemotherapy. 1 capsule 0  ? methylphenidate (RITALIN) 5 MG tablet Take 1 tablet (5 mg total) by mouth 2 (two) times daily. (Patient not taking: Reported on 01/01/2022) 60 tablet 0  ? ?No facility-administered medications prior to visit.  ? ? ?No Known Allergies ? ?ROS ?Review of Systems ? ?  ?Objective: N/A  ?  ?Physical Exam ? ?LMP 06/30/2015  ?Wt Readings from Last 3 Encounters:  ?11/12/21 139 lb 7 oz (63.2 kg)  ?10/25/21 135 lb 8 oz (61.5 kg)  ?10/11/21 136 lb (61.7 kg)  ? ? ? ?Health Maintenance Due  ?Topic Date Due  ? Zoster Vaccines- Shingrix (1 of 2) Never done  ? COLONOSCOPY (Pts 45-71yr Insurance coverage will need to be confirmed)  05/25/2020  ? COVID-19 Vaccine (2 - [place] risk series) 08/04/2020  ? PAP SMEAR-Modifier  10/01/2021  ? ? ?There are no preventive care reminders to display for this patient. ? ?Lab Results  ?Component Value Date  ? TSH 1.20 08/02/2020  ? ?Lab Results  ?Component Value Date  ? WBC 2.0 (L) 01/22/2022  ? HGB 13.9 01/22/2022  ? HCT 40.3 01/22/2022  ? MCV 97.8 01/22/2022  ? PLT 189 01/22/2022  ? ?Lab Results  ?Component Value Date  ? NA 139 01/22/2022  ? K 3.8 01/22/2022  ? CO2 25 01/22/2022  ? GLUCOSE 123 (H) 01/22/2022  ? BUN 10 01/22/2022  ? CREATININE 0.63 01/22/2022  ? BILITOT 0.3 01/22/2022  ? ALKPHOS 48 01/22/2022  ? AST 16 01/22/2022  ? ALT 10 01/22/2022  ? PROT 7.0 01/22/2022  ? ALBUMIN 4.1 01/22/2022  ? CALCIUM 9.8 01/22/2022  ? ANIONGAP 9 01/22/2022  ? GFR 74.35 08/02/2020  ? ?Lab Results  ?Component Value Date  ? CHOL 235 (H) 08/02/2020  ? ?Lab Results  ?Component Value Date  ? HDL 76.30 08/02/2020  ? ?Lab  Results  ?Component Value Date  ? LMoody145 (H) 08/02/2020  ? ?Lab Results  ?Component Value Date  ? TRIG 72.0 08/02/2020  ? ?Lab Results  ?Component Value Date  ? CHOLHDL 3 08/02/2020  ? ?Lab Results  ?Component Value Date  ? HGBA1C  5.1 12/14/2020  ? ? ?  ?Assessment & Plan:  ? ?Problem List Items Addressed This Visit   ?None ?Visit Diagnoses   ? ? Palliative care encounter    -  Primary  ? ?  ? ? ?No orders of the defined types were placed in this encounter. ? ? ?Follow-up: No follow-ups on file.  ? ? ?Katheren Puller, LCSW ?

## 2022-01-31 NOTE — Progress Notes (Deleted)
COMMUNITY PALLIATIVE CARE SW NOTE ? ?PATIENT NAME: Earlisha A Vilar ?DOB: 07-12-1961 ?MRN: [id number] ? ?PRIMARY CARE PROVIDER: [name], DO ? ?RESPONSIBLE PARTY:  ?Acct ID - Guarantor Home Phone Work Phone Relationship Acct Type  ?[id number] ILINE, BUCHINGER* [phone]  Self P/F  ?   [address] [place], [place], [place] [address]  ? ? ? ?PLAN OF CARE and INTERVENTIONS:             ?GOALS OF CARE/ ADVANCE CARE PLANNING:  *** ?SOCIAL/EMOTIONAL/SPIRITUAL ASSESSMENT/ INTERVENTIONS:  *** ?PATIENT/CAREGIVER EDUCATION/ COPING:  *** ?PERSONAL EMERGENCY PLAN:  *** ?COMMUNITY RESOURCES COORDINATION/ HEALTH CARE NAVIGATION:  *** ?FINANCIAL/LEGAL CONCERNS/INTERVENTIONS:  *** ?   ? ?SOCIAL HX:  ?Social History  ? ?Tobacco Use  ? Smoking status: Former  ?  Years: 2.00  ?  Types: Cigarettes  ?  Quit date: 11/05/1979  ?  Years since quitting: 42.2  ? Smokeless tobacco: Never  ? Tobacco comments:  ?  1/2 pack a week  ?Substance Use Topics  ? Alcohol use: Yes  ?  Alcohol/week: 0.0 standard drinks  ?  Comment: 1 glass of wine weekly  ? ? ?CODE STATUS:   Code Status: Prior  ?ADVANCED DIRECTIVES: N ?MOST FORM COMPLETE:  {Responses; yes/no} ?HOSPICE EDUCATION PROVIDED: *** ? ?PPS:*** ? ? ? ? ? ?Katheren Puller, LCSW ? ?

## 2022-02-04 ENCOUNTER — Other Ambulatory Visit (HOSPITAL_COMMUNITY): Payer: Self-pay

## 2022-02-05 ENCOUNTER — Other Ambulatory Visit: Payer: Self-pay | Admitting: Internal Medicine

## 2022-02-05 ENCOUNTER — Ambulatory Visit: Payer: BC Managed Care – PPO

## 2022-02-05 ENCOUNTER — Other Ambulatory Visit: Payer: BC Managed Care – PPO

## 2022-02-05 ENCOUNTER — Ambulatory Visit: Payer: BC Managed Care – PPO | Admitting: Internal Medicine

## 2022-02-05 ENCOUNTER — Telehealth: Payer: Self-pay

## 2022-02-05 MED ORDER — BACLOFEN 5 MG PO TABS: 5.0000 mg | ORAL_TABLET | Freq: Three times a day (TID) | ORAL | 3 refills | Status: DC

## 2022-02-05 NOTE — Telephone Encounter (Signed)
Pt's spouse Allisyn Kunz requested for pt's pharmacy to primarily be [facility] [place]. This was updated by this LPN. Also requests for pt's Baclofen to be refilled soon, I assured the pt I would let Dr.Vaslow know. Lanny Hurst verbalized understanding. This LPN let Lanny Hurst know if the pt needs anything else to call the office anytime!  ?

## 2022-02-06 ENCOUNTER — Other Ambulatory Visit: Payer: Self-pay | Admitting: Internal Medicine

## 2022-02-06 ENCOUNTER — Telehealth: Payer: Self-pay

## 2022-02-06 ENCOUNTER — Telehealth: Payer: Self-pay | Admitting: *Deleted

## 2022-02-06 MED ORDER — APIXABAN 5 MG PO TABS: ORAL_TABLET | Freq: Two times a day (BID) | ORAL | 1 refills | Status: DC

## 2022-02-06 NOTE — Telephone Encounter (Addendum)
Contacted patient regarding refill request from [facility] for Eliquis for a one year supply (last ordered for patient 2 months ago) to confirm that a year supply was requested by pharmacy was requested by patient. ?Spoke with spouse Octa Uplinger (DPR on file).  ? ?He states patient does not need refill of Eliquis at this time. They will call office when refill is needed as they are changing pharmacies and [facility] [place].  ? ?Per Mr. Rosemeyer, patient will not be using [facility] pharmacy any longer as insurance changed and it is no longer covered. He states [facility] [place] [facility] primary [facility].  ? ?At his request, updated pharmacy info in chart and removed [facility] from preferred list.  ? ?Call routed to Dr. Mickeal Skinner as [facility] ? ? ?

## 2022-02-06 NOTE — Telephone Encounter (Addendum)
Contacted patient regarding refill request from [facility] for Eliquis for a one year supply (last ordered for patient 2 months ago) to confirm that a year supply was requested by pharmacy was requested by patient. ?Spoke with spouse [name] (DPR on file).  ?  ?He states patient does not need refill of Eliquis at this time. They will call office when refill is needed as they are changing pharmacies and [facility] [place].  ?  ?Per Mr. Alatorre, patient will not be using [facility] pharmacy any longer as insurance changed and it is no longer covered. He states [facility] [place] [facility].  ?  ?At his request, updated pharmacy info in chart and removed [facility] from preferred list.  ?  ?Call routed to Dr. Mickeal Skinner as [facility] ?  ?  ?

## 2022-02-06 NOTE — Telephone Encounter (Signed)
Inocencio Homes, RN spoke with pt's husband, Lanny Hurst, regarding the [facility] and he mentioned he received a [facility] from [facility] that they were contacting the MD regarding the Baclofen rx.  I spoke with Maudie Mercury at [facility] and she stated pt's ins did not cover the baclofen but she could use a Good RX card and the price for #90 would be $35.39.  Lanny Hurst was advised and will call [facility] [phone] if they wish to proceed with filling the prescription. ?

## 2022-02-12 ENCOUNTER — Inpatient Hospital Stay: Payer: BC Managed Care – PPO

## 2022-02-12 ENCOUNTER — Inpatient Hospital Stay: Payer: BC Managed Care – PPO | Attending: Internal Medicine

## 2022-02-12 ENCOUNTER — Other Ambulatory Visit: Payer: Self-pay | Admitting: *Deleted

## 2022-02-12 ENCOUNTER — Inpatient Hospital Stay: Payer: BC Managed Care – PPO | Admitting: Internal Medicine

## 2022-02-12 ENCOUNTER — Other Ambulatory Visit: Payer: Self-pay

## 2022-02-12 VITALS — BP 133/83 | HR 89 | Temp 98.1°F | Resp 18

## 2022-02-12 DIAGNOSIS — C711 Malignant neoplasm of frontal lobe: Secondary | ICD-10-CM | POA: Diagnosis present

## 2022-02-12 DIAGNOSIS — Z923 Personal history of irradiation: Secondary | ICD-10-CM | POA: Diagnosis not present

## 2022-02-12 DIAGNOSIS — Z79899 Other long term (current) drug therapy: Secondary | ICD-10-CM | POA: Diagnosis not present

## 2022-02-12 DIAGNOSIS — G40909 Epilepsy, unspecified, not intractable, without status epilepticus: Secondary | ICD-10-CM | POA: Diagnosis not present

## 2022-02-12 DIAGNOSIS — Z5112 Encounter for antineoplastic immunotherapy: Secondary | ICD-10-CM | POA: Diagnosis present

## 2022-02-12 DIAGNOSIS — C719 Malignant neoplasm of brain, unspecified: Secondary | ICD-10-CM | POA: Diagnosis not present

## 2022-02-12 DIAGNOSIS — Z9221 Personal history of antineoplastic chemotherapy: Secondary | ICD-10-CM | POA: Diagnosis not present

## 2022-02-12 LAB — CMP (CANCER CENTER ONLY)
ALT: 12 U/L (ref 0–44)
AST: 17 U/L (ref 15–41)
Albumin: 4 g/dL (ref 3.5–5.0)
Alkaline Phosphatase: 58 U/L (ref 38–126)
Anion gap: 10 (ref 5–15)
BUN: 14 mg/dL (ref 8–23)
CO2: 25 mmol/L (ref 22–32)
Calcium: 9.7 mg/dL (ref 8.9–10.3)
Chloride: 108 mmol/L (ref 98–111)
Creatinine: 0.74 mg/dL (ref 0.44–1.00)
GFR, Estimated: >60 mL/min (ref >60)
Glucose, Bld: 141 mg/dL — ABNORMAL HIGH (ref 70–99)
Potassium: 3.7 mmol/L (ref 3.5–5.1)
Sodium: 143 mmol/L (ref 135–145)
Total Bilirubin: 0.4 mg/dL (ref 0.3–1.2)
Total Protein: 7.1 g/dL (ref 6.5–8.1)

## 2022-02-12 LAB — CBC WITH DIFFERENTIAL (CANCER CENTER ONLY)
Abs Immature Granulocytes: 0.01 10*3/uL (ref 0.00–0.07)
Basophils Absolute: 0 10*3/uL (ref 0.0–0.1)
Basophils Relative: 1 %
Eosinophils Absolute: 0 10*3/uL (ref 0.0–0.5)
Eosinophils Relative: 1 %
HCT: 41.5 % (ref 36.0–46.0)
Hemoglobin: 14.4 g/dL (ref 12.0–15.0)
Immature Granulocytes: 0 %
Lymphocytes Relative: 22 %
Lymphs Abs: 0.6 10*3/uL — ABNORMAL LOW (ref 0.7–4.0)
MCH: 34.6 pg — ABNORMAL HIGH (ref 26.0–34.0)
MCHC: 34.7 g/dL (ref 30.0–36.0)
MCV: 99.8 fL (ref 80.0–100.0)
Monocytes Absolute: 0.3 10*3/uL (ref 0.1–1.0)
Monocytes Relative: 9 %
Neutro Abs: 1.9 10*3/uL (ref 1.7–7.7)
Neutrophils Relative %: 67 %
Platelet Count: 184 10*3/uL (ref 150–400)
RBC: 4.16 MIL/uL (ref 3.87–5.11)
RDW: 13 % (ref 11.5–15.5)
WBC Count: 2.8 10*3/uL — ABNORMAL LOW (ref 4.0–10.5)
nRBC: 0 % (ref 0.0–0.2)

## 2022-02-12 MED ORDER — SODIUM CHLORIDE 0.9 % IV SOLN
600.0000 mg | Freq: Once | INTRAVENOUS | Status: AC
  Administered 2022-02-12: 600 mg via INTRAVENOUS
  Filled 2022-02-12: qty 16

## 2022-02-12 MED ORDER — SODIUM CHLORIDE 0.9 % IV SOLN: Freq: Once | INTRAVENOUS | Status: AC

## 2022-02-12 NOTE — Progress Notes (Signed)
Per Dr Mickeal Skinner ok to proceed today with Avastin 02/12/2022 without weight and urine protein.  Lab and vs are approved also for treatment. ? ?

## 2022-02-12 NOTE — Progress Notes (Signed)
? ?[facility]  ?[place], [place] [address] ?[phone] ? ? ?Interval Evaluation ? ?Date of Service: 02/12/22 ?Patient Name: Hannah [name] ?Patient MRN: [id number] ?Patient DOB: 08/28/1961 ?Provider: [name], MD ? ?Identifying Statement:  ?Hannah [name] is a 61 y.o. female with left frontal glioblastoma  ? ?Oncologic History: ?Oncology History  ?GBM (glioblastoma multiforme) ([place])  ? Initial Diagnosis  ? GBM (glioblastoma multiforme) ([place]) ?  ?02/17/2020 Surgery  ? Craniotomy, left frontal resection by Dr. Marcello Moores.  Path is glioblastoma. ?  ?08/28/2020 - 10/09/2020 Radiation Therapy  ? Radiation and concurrent Temozolomide 27m/m2 daily ?  ?11/10/2020 - 12/09/2020 Chemotherapy  ? Completes 1 cycle of 5-day Temozolomide ?  ? ?  [facility], undergoes thrombectomy and IVC filter placement ?  ?03/09/2021 Progression  ?  ?  ?03/27/2021 -  Chemotherapy  ? Initiates second line therapy with Avastin 154mkg IV q2 weeks ? ?  ?08/08/2021 - 08/09/2021 Chemotherapy  ? Patient is on Treatment Plan : BRAIN GLIOBLASTOMA Consolidation Temozolomide Days 1-5 q28 Days   ?   ?12/03/2021 -  Chemotherapy  ? Patient is on Treatment Plan : BRAIN Lomustine q42d  ?   ? ? ?Biomarkers: ? ?MGMT Methylated.  ?IDH 1/2 Wild type.  ?EGFR Unknown  ?TERT Unknown  ? ?Interval History: ?Hannah [name] presents today for avastin infusion now having begun dosing CCNU cycle #2.  She dosed without any difficulty on 02/08/22.  No significant changes with ongoing fatigue, lethargy, continues reliance on family for functional support.  No new or progressive complaints today.  Continues on Vimpat to 10077m00mg.  Still off the steroids. ? ?Decadron ?11/09/20: 2mg71m2/10/22: 8mg 12m/21/22: 6mg ?75m07/22: 4mg ?011m9/22: 8mg ?0449m/22: 6mg ?05/1m22: 3mg ?05/261m2: 2mg ?06/06104m: 1.5mg ?06/20/57m - ? ?H+P (01/20/20) Patient presented one month ago with sudden onset  left arm and leg weakness and interrupted speech, [name], [name], stroke eval and CNS imaging which demonstrated a non-enhancing left frontal mass.  At this time she is back to baseline without recurrence of events, taking Keppra 500mg twice p36may.  No history or seizure or any other neurologic events.  She does describe being sleep deprived prior to seizure event.  She presents today after one month follow up MRI scan. ? ?Medications: ?Current Outpatient Medications on File Prior to Visit  ?Medication Sig Dispense Refill  ? amLODipine (NORVASC) 5 MG tablet Take 1 tablet (5 mg total) by mouth daily. 30 tablet 3  ? apixaban (ELIQUIS) 5 MG TABS tablet TAKE 1 TABLET (5 MG TOTAL) BY MOUTH 2 (TWO) TIMES DAILY. 180 tablet 1  ? Baclofen 5 MG TABS Take 1 tablet (5 mg) by mouth 3 (three) times daily. 90 tablet 3  ? cholecalciferol (VITAMIN D3) 25 MCG (1000 UNIT) tablet Take 1,000 Units by mouth daily.    ? docusate sodium (COLACE) 100 MG capsule Take 100 mg by mouth 2 (two) times daily as needed for mild constipation.    ? Lacosamide 100 MG TABS Take 1 tablet by mouth in the morning (100mg) and 2 t62mts by mouth at bedtime (200mg) 90 table26m ? methylphenidate (RITALIN) 5 MG tablet Take 1 tablet (5 mg total) by mouth 2 (two) times daily. (Patient not taking: Reported on 01/01/2022) 60  tablet 0  ? [DISCONTINUED] lamoTRIgine (LAMICTAL) 100 MG tablet Take 1 tablet (100 mg total) by mouth daily. 30 tablet 3  ? ?No current facility-administered medications on file prior to visit.  ? ? ?Allergies: No Known Allergies ?Past Medical History:  ?Past Medical History:  ?Diagnosis Date  ? Cervical spondylolysis 12/15/2019  ? Skeleton: Mild cervical spondylosis C6-7. Incidental find.   ? Colon polyp   ? GBM (glioblastoma multiforme) ([facility])   ? Left ankle sprain 10/07/2011  ? Seizure ([facility])   ? partial   ? Tick bite  02/2020  ? ?Past Surgical History:  ?Past Surgical History:  ?Procedure Laterality Date  ? APPLICATION OF CRANIAL NAVIGATION Left 02/16/2020  ? Procedure: APPLICATION OF CRANIAL NAVIGATION;  Surgeon: Vallarie Mare, MD;  Location: [facility];  Service: Neurosurgery;  Laterality: Left;  ? CRANIOTOMY Left 02/16/2020  ? Procedure: LEFT FRONTAL CRANIOTOMY FOR BRAIN TUMOR;  Surgeon: Vallarie Mare, MD;  Location: [facility];  Service: Neurosurgery;  Laterality: Left;  ? FOOT SURGERY Left   ? bone spur - local anesthesia  ? IR ANGIOGRAM PULMONARY BILATERAL SELECTIVE  12/15/2020  ? IR ANGIOGRAM SELECTIVE EACH ADDITIONAL VESSEL  12/15/2020  ? IR ANGIOGRAM SELECTIVE EACH ADDITIONAL VESSEL  12/15/2020  ? IR IVC FILTER PLMT / S&I /IMG GUID/MOD SED  12/15/2020  ? IR THROMBECT PRIM MECH INIT (INCLU) MOD SED  12/15/2020  ? IR THROMBECT PRIM MECH INIT (INCLU) MOD SED  12/15/2020  ? RADIOLOGY WITH ANESTHESIA N/A 12/15/2020  ? Procedure: IR WITH ANESTHESIA;  Surgeon: Radiologist, Medication, MD;  Location: [facility];  Service: Radiology;  Laterality: N/A;  ? TONSILECTOMY/ADENOIDECTOMY WITH MYRINGOTOMY    ? tonsils and adenoids out only  ? WISDOM TOOTH EXTRACTION    ? ?Social History:  ?Social History  ? ?Socioeconomic History  ? Marital status: Married  ?  Spouse name: Not on file  ? Number of children: 3  ? Years of education: Not on file  ? Highest education level: Not on file  ?Occupational History  ? Not on file  ?Tobacco Use  ? Smoking status: Former  ?  Years: 2.00  ?  Types: Cigarettes  ?  Quit date: 11/05/1979  ?  Years since quitting: 42.3  ? Smokeless tobacco: Never  ? Tobacco comments:  ?  1/2 pack a week  ?Vaping Use  ? Vaping Use: Never used  ?Substance and Sexual Activity  ? Alcohol use: Yes  ?  Alcohol/week: 0.0 standard drinks  ?  Comment: 1 glass of wine weekly  ? Drug use: No  ? Sexual activity: Yes  ?  Birth control/protection: Surgical  ?Other Topics Concern  ? Not on file  ?Social History Narrative  ? Not on file  ? ?Social  Determinants of Health  ? ?Financial Resource Strain: Not on file  ?Food Insecurity: Not on file  ?Transportation Needs: Not on file  ?Physical Activity: Not on file  ?Stress: Not on file  ?Social Connections: Not on file  ?Intimate Partner Violence: Not on file  ? ?Family History:  ?Family History  ?Problem Relation Age of Onset  ? Hypertension Mother   ? Stroke Mother   ? Diabetes Mother   ? Colon cancer Father 31  ? Hypertension Sister   ? Colon polyps Sister   ? Heart disease Brother 52  ? Diabetes Maternal Uncle   ? Diabetes Maternal Uncle   ? ? ?Review of Systems: ?Constitutional: Doesn't report fevers, chills or abnormal weight loss ?Eyes:  Doesn't report blurriness of vision ?Ears, nose, mouth, throat, and face: Doesn't report sore throat ?Respiratory: Chest wall pain ?Cardiovascular: Doesn't report palpitation, chest discomfort  ?Gastrointestinal:  Doesn't report nausea, constipation, diarrhea ?GU: Doesn't report incontinence ?Skin: Doesn't report skin rashes ?Neurological: Per HPI ?Musculoskeletal: normal ?Behavioral/Psych: Doesn't report anxiety ? ?Physical Exam: ?Vitals:  ? 02/12/22 1026  ?BP: 133/83  ?Pulse: 89  ?Resp: 18  ?Temp: 98.1 ?F (36.7 ?C)  ?SpO2: 98%  ? ? ?KPS: 60. ?General: Alert, cooperative, pleasant, in no acute distress ?Head: Normal ?EENT: No conjunctival injection or scleral icterus.  ?Lungs: Resp effort normal ?Cardiac: Regular rate ?Abdomen: Non-distended abdomen ?Skin: No rashes cyanosis or petechiae. ?Extremities: No clubbing or edema ? ?Neurologic Exam: ?Mental Status: Awake, alert, attentive to examiner. Both receptive and expressive dysphasia.  ?Cranial Nerves: Visual acuity is grossly normal. Visual fields are full. Extra-ocular movements intact. No ptosis. Face is symmetric ?Motor: Tone and bulk are normal. Right leg 3/5, left leg 4/5, proximal greater than distal weakness.  Noted R leg flexor spasticity. Reflexes are symmetric, no pathologic reflexes present.  ?Sensory: Intact  to light touch ?Gait: Deferred today ? ?Labs: ?I have reviewed the data as listed ?   ?Component Value Date/Time  ? NA 139 01/22/2022 1008  ? K 3.8 01/22/2022 1008  ? CL 105 01/22/2022 1008  ? CO2 25 03/2

## 2022-02-12 NOTE — Progress Notes (Signed)
Per Dr Mickeal Skinner, okay to proceed with treatment today without a urine protein result. ?

## 2022-02-12 NOTE — Patient Instructions (Signed)
[facility]ICAL [facility]COLOG[facility]  Discharge Instructions: Thank you for choosing [facility] to provide your oncology and hematology care.   If you have a lab appointment with the Cancer Center, please go directly to the Cancer Center and check in at the registration area.   Wear comfortable clothing and clothing appropriate for easy access to any Portacath or PICC line.   We strive to give you quality time with your provider. You may need to reschedule your appointment if you arrive late (15 or more minutes).  Arriving late affects you and other patients whose appointments are after yours.  Also, if you miss three or more appointments without notifying the office, you may be dismissed from the clinic at the provider's discretion.      For prescription refill requests, have your pharmacy contact our office and allow 72 hours for refills to be completed.    Today you received the following chemotherapy and/or immunotherapy agents [id number]   To help prevent nausea and vomiting after your treatment, we encourage you to take your nausea medication as directed.  BELOW ARE SYMPTOMS THAT SHOULD BE REPORTED IMMEDIATELY: . *FEVER GREATER THAN 100.4 F (38 C) OR HIGHER . *CHILLS OR SWEATING . *NAUSEA AND VOMITING THAT IS NOT CONTROLLED WITH YOUR NAUSEA MEDICATION . *UNUSUAL SHORTNESS OF BREATH . *UNUSUAL BRUISING OR BLEEDING . *URINARY PROBLEMS (pain or burning when urinating, or frequent urination) . *BOWEL PROBLEMS (unusual diarrhea, constipation, pain near the anus) . TENDERNESS IN MOUTH AND THROAT WITH OR WITHOUT PRESENCE OF ULCERS (sore throat, sores in mouth, or a toothache) . UNUSUAL RASH, SWELLING OR PAIN  . UNUSUAL VAGINAL DISCHARGE OR ITCHING   Items with * indicate a potential emergency and should be followed up as soon as possible or go to the Emergency Department if any problems should occur.  Please show the CHEMOTHERAPY ALERT CARD or IMMUNOTHERAPY  ALERT CARD at check-in to the Emergency Department and triage nurse.  Should you have questions after your visit or need to cancel or reschedule your appointment, please contact [facility]  Dept: [phone]  and follow the prompts.  Office hours are 8:00 a.m. to 4:30 p.m. Monday - Friday. Please note that voicemails left after 4:00 p.m. may not be returned until the following business day.  We are closed weekends and major holidays. You have access to a nurse at all times for urgent questions. Please call the main number to the clinic Dept: [phone] and follow the prompts.   For any non-urgent questions, you may also contact your provider using MyChart. We now offer e-Visits for anyone 18 and older to request care online for non-urgent symptoms. For details visit [email or web address].   Also download the MyChart app! Go to the app store, search "MyChart", open the app, select [facility], and log in with your MyChart username and password.  Due to Covid, a mask is required upon entering the hospital/clinic. If you do not have a mask, one will be given to you upon arrival. For doctor visits, patients may have 1 support person aged 18 or older with them. For treatment visits, patients cannot have anyone with them due to current Covid guidelines and our immunocompromised population.   

## 2022-02-19 ENCOUNTER — Telehealth: Payer: Self-pay

## 2022-02-19 NOTE — Telephone Encounter (Signed)
Notified Spouse of completion of Disability paperwork for [employer]. Fax transmission confirmation received for Attending Physician Statement. Request for medical records forwarded to [facility] Management Department with signed Release of Information Forms. Copy of forms mailed to Patient as requested. ?

## 2022-03-01 ENCOUNTER — Other Ambulatory Visit (HOSPITAL_COMMUNITY): Payer: Self-pay

## 2022-03-04 ENCOUNTER — Other Ambulatory Visit (HOSPITAL_COMMUNITY): Payer: Self-pay

## 2022-03-05 ENCOUNTER — Telehealth: Payer: Self-pay

## 2022-03-05 ENCOUNTER — Inpatient Hospital Stay: Payer: BC Managed Care – PPO

## 2022-03-05 ENCOUNTER — Telehealth: Payer: Self-pay | Admitting: Internal Medicine

## 2022-03-05 ENCOUNTER — Inpatient Hospital Stay: Payer: BC Managed Care – PPO | Admitting: Internal Medicine

## 2022-03-05 ENCOUNTER — Other Ambulatory Visit: Payer: Self-pay

## 2022-03-05 ENCOUNTER — Inpatient Hospital Stay: Payer: BC Managed Care – PPO | Attending: Internal Medicine

## 2022-03-05 VITALS — BP 127/89 | HR 74 | Temp 97.7°F | Resp 18 | Wt 140.4 lb

## 2022-03-05 DIAGNOSIS — C719 Malignant neoplasm of brain, unspecified: Secondary | ICD-10-CM | POA: Diagnosis not present

## 2022-03-05 DIAGNOSIS — G40909 Epilepsy, unspecified, not intractable, without status epilepticus: Secondary | ICD-10-CM | POA: Diagnosis not present

## 2022-03-05 DIAGNOSIS — N342 Other urethritis: Secondary | ICD-10-CM | POA: Insufficient documentation

## 2022-03-05 DIAGNOSIS — N390 Urinary tract infection, site not specified: Secondary | ICD-10-CM

## 2022-03-05 DIAGNOSIS — Z5112 Encounter for antineoplastic immunotherapy: Secondary | ICD-10-CM | POA: Diagnosis present

## 2022-03-05 DIAGNOSIS — Z7901 Long term (current) use of anticoagulants: Secondary | ICD-10-CM | POA: Diagnosis not present

## 2022-03-05 DIAGNOSIS — Z79899 Other long term (current) drug therapy: Secondary | ICD-10-CM | POA: Diagnosis not present

## 2022-03-05 DIAGNOSIS — C711 Malignant neoplasm of frontal lobe: Secondary | ICD-10-CM | POA: Diagnosis present

## 2022-03-05 LAB — CBC WITH DIFFERENTIAL (CANCER CENTER ONLY)
Abs Immature Granulocytes: 0.01 10*3/uL (ref 0.00–0.07)
Basophils Absolute: 0 10*3/uL (ref 0.0–0.1)
Basophils Relative: 0 %
Eosinophils Absolute: 0 10*3/uL (ref 0.0–0.5)
Eosinophils Relative: 1 %
HCT: 42.1 % (ref 36.0–46.0)
Hemoglobin: 14.8 g/dL (ref 12.0–15.0)
Immature Granulocytes: 0 %
Lymphocytes Relative: 16 %
Lymphs Abs: 0.5 10*3/uL — ABNORMAL LOW (ref 0.7–4.0)
MCH: 35 pg — ABNORMAL HIGH (ref 26.0–34.0)
MCHC: 35.2 g/dL (ref 30.0–36.0)
MCV: 99.5 fL (ref 80.0–100.0)
Monocytes Absolute: 0.4 10*3/uL (ref 0.1–1.0)
Monocytes Relative: 12 %
Neutro Abs: 2.3 10*3/uL (ref 1.7–7.7)
Neutrophils Relative %: 71 %
Platelet Count: 108 10*3/uL — ABNORMAL LOW (ref 150–400)
RBC: 4.23 MIL/uL (ref 3.87–5.11)
RDW: 13.2 % (ref 11.5–15.5)
WBC Count: 3.3 10*3/uL — ABNORMAL LOW (ref 4.0–10.5)
nRBC: 0 % (ref 0.0–0.2)

## 2022-03-05 LAB — URINALYSIS, COMPLETE (UACMP) WITH MICROSCOPIC
Bilirubin Urine: NEGATIVE
Glucose, UA: NEGATIVE mg/dL
Hgb urine dipstick: NEGATIVE
Ketones, ur: NEGATIVE mg/dL
Nitrite: POSITIVE — AB
Protein, ur: NEGATIVE mg/dL
Specific Gravity, Urine: 1.02 (ref 1.005–1.030)
WBC, UA: >50 WBC/hpf — ABNORMAL HIGH (ref 0–5)
pH: 5 (ref 5.0–8.0)

## 2022-03-05 LAB — CMP (CANCER CENTER ONLY)
ALT: 15 U/L (ref 0–44)
AST: 19 U/L (ref 15–41)
Albumin: 4.1 g/dL (ref 3.5–5.0)
Alkaline Phosphatase: 60 U/L (ref 38–126)
Anion gap: 8 (ref 5–15)
BUN: 14 mg/dL (ref 8–23)
CO2: 26 mmol/L (ref 22–32)
Calcium: 9.7 mg/dL (ref 8.9–10.3)
Chloride: 109 mmol/L (ref 98–111)
Creatinine: 0.77 mg/dL (ref 0.44–1.00)
GFR, Estimated: >60 mL/min (ref >60)
Glucose, Bld: 120 mg/dL — ABNORMAL HIGH (ref 70–99)
Potassium: 3.7 mmol/L (ref 3.5–5.1)
Sodium: 143 mmol/L (ref 135–145)
Total Bilirubin: 0.5 mg/dL (ref 0.3–1.2)
Total Protein: 7 g/dL (ref 6.5–8.1)

## 2022-03-05 LAB — TOTAL PROTEIN, URINE DIPSTICK: Protein, ur: NEGATIVE mg/dL

## 2022-03-05 MED ORDER — CIPROFLOXACIN HCL 500 MG PO TABS: 500.0000 mg | ORAL_TABLET | Freq: Two times a day (BID) | ORAL | 0 refills | Status: DC

## 2022-03-05 NOTE — Telephone Encounter (Signed)
.  Called pt per 5/2 inbasket , Patient was unavailable, a message with appt time and date was left with number on file.   ?

## 2022-03-05 NOTE — Progress Notes (Signed)
A consult was placed to the IV Therapist at [facility] ; staff at the Cancer center has been unable to obtain iv access for pt's medication; they attempted, and this writer attempted x 3, with assistance from the RN and the pt's husband; pt unable to move her arms well, or to even rotate them easily ; ultrasound was used; very poor veins;  suggest a portacath for this patient.  The RN has notified the physician.   ?

## 2022-03-05 NOTE — Progress Notes (Signed)
Unsuccessful IV attempts x 3 by infusion staff, IV team has been notified. ?

## 2022-03-05 NOTE — Telephone Encounter (Signed)
Spoke with pt's husband Lanny Hurst regarding PICC line placement. He does not really want to do that and would like to speak with Dr.Vaslow further into detail regarding the picc, etc. Dr. Mickeal Skinner has been made aware that the pt wants a call before anything is scheduled.  ?

## 2022-03-05 NOTE — Telephone Encounter (Signed)
Fransisco Hertz RN was made aware that Dr.Vaslow ordered 7 days of Cipro for UTI to pharmacy on file. Fransisco Hertz RN states she will make pt and family member aware.  ?

## 2022-03-05 NOTE — Addendum Note (Signed)
Addended by: [name] on: 03/05/2022 02:05 PM ? ? Modules accepted: Orders ? ?

## 2022-03-05 NOTE — Progress Notes (Signed)
? ?[facility] [address]  ?[place], [place] [address] ?[phone] ? ? ?Interval Evaluation ? ?Date of Service: 03/05/22 ?Patient Name: Hannah [name] ?Patient MRN: [id number] ?Patient DOB: [birth year] ?Provider: [name], MD ? ?Identifying Statement:  ?Hannah [name] is a 61 y.o. female with left frontal glioblastoma  ? ?Oncologic History: ?Oncology History  ?GBM (glioblastoma multiforme) ([facility])  ? Initial Diagnosis  ? GBM (glioblastoma multiforme) ([facility]) ?  ?02/17/2020 Surgery  ? Craniotomy, left frontal resection by Dr. Marcello Moores.  Path is glioblastoma. ?  ?08/28/2020 - 10/09/2020 Radiation Therapy  ? Radiation and concurrent Temozolomide 70m/m2 daily ?  ?11/10/2020 - 12/09/2020 Chemotherapy  ? Completes 1 cycle of 5-day Temozolomide ?  ? ?  [facility], undergoes thrombectomy and IVC filter placement ?  ?03/09/2021 Progression  ?  ?  ?03/27/2021 -  Chemotherapy  ? Initiates second line therapy with Avastin 188mkg IV q2 weeks ? ?  ?08/08/2021 - 08/09/2021 Chemotherapy  ? Patient is on Treatment Plan : BRAIN GLIOBLASTOMA Consolidation Temozolomide Days 1-5 q28 Days   ? ?  ?  ?12/03/2021 -  Chemotherapy  ? Patient is on Treatment Plan : BRAIN Lomustine q42d  ? ?  ?  ? ? ?Biomarkers: ? ?MGMT Methylated.  ?IDH 1/2 Wild type.  ?EGFR Unknown  ?TERT Unknown  ? ?Interval History: ?Hannah [name] presents today for avastin infusion, now in midst of CCNU cycle #2.  Husband feels she has had periods of increased cognitive sharpness since decreasing the vimpat to 150/100.  No significant changes with ongoing fatigue, lethargy, continues reliance on family for functional support.  No other new or progressive complaints today.   ? ?Decadron ?11/09/20: 2m71m02/10/22: 8mg19m2/21/22: 6mg 77m/07/22: 4mg ?90m29/22: 8mg ?039m1/22: 6mg ?0579m/22: 3mg ?05/74m22: 2mg ?06/087m2: 1.5mg ?06/2064m: - ? ?H+P (01/20/20) Patient presented one month ago with  sudden onset left arm and leg weakness and interrupted speech, [name], [name], stroke eval and CNS imaging which demonstrated a non-enhancing left frontal mass.  At this time she is back to baseline without recurrence of events, taking Keppra 500mg twice 75mday.  No history or seizure or any other neurologic events.  She does describe being sleep deprived prior to seizure event.  She presents today after one month follow up MRI scan. ? ?Medications: ?Current Outpatient Medications on File Prior to Visit  ?Medication Sig Dispense Refill  ? amLODipine (NORVASC) 5 MG tablet Take 1 tablet (5 mg total) by mouth daily. 30 tablet 3  ? apixaban (ELIQUIS) 5 MG TABS tablet TAKE 1 TABLET (5 MG TOTAL) BY MOUTH 2 (TWO) TIMES DAILY. 180 tablet 1  ? Baclofen 5 MG TABS Take 1 tablet (5 mg) by mouth 3 (three) times daily. 90 tablet 3  ? cholecalciferol (VITAMIN D3) 25 MCG (1000 UNIT) tablet Take 1,000 Units by mouth daily.    ? docusate sodium (COLACE) 100 MG capsule Take 100 mg by mouth 2 (two) times daily as needed for mild constipation.    ? Lacosamide 100 MG TABS Take 1 tablet by mouth in the morning (100mg) and 2 73mets by mouth at bedtime (200mg) 90 tabl12m  ? methylphenidate (RITALIN) 5 MG tablet Take 1 tablet (5 mg total) by mouth 2 (two) times daily. (Patient not  taking: Reported on 01/01/2022) 60 tablet 0  ? [DISCONTINUED] lamoTRIgine (LAMICTAL) 100 MG tablet Take 1 tablet (100 mg total) by mouth daily. 30 tablet 3  ? ?No current facility-administered medications on file prior to visit.  ? ? ?Allergies: No Known Allergies ?Past Medical History:  ?Past Medical History:  ?Diagnosis Date  ? Cervical spondylolysis 12/15/2019  ? Skeleton: Mild cervical spondylosis C6-7. Incidental find.   ? Colon polyp   ? GBM (glioblastoma multiforme) ([facility])   ? Left ankle sprain 10/07/2011  ? Seizure ([place] [facility])   ? partial    ? Tick bite 02/2020  ? ?Past Surgical History:  ?Past Surgical History:  ?Procedure Laterality Date  ? APPLICATION OF CRANIAL NAVIGATION Left 02/16/2020  ? Procedure: APPLICATION OF CRANIAL NAVIGATION;  Surgeon: Vallarie Mare, MD;  Location: [facility];  Service: Neurosurgery;  Laterality: Left;  ? CRANIOTOMY Left 02/16/2020  ? Procedure: LEFT FRONTAL CRANIOTOMY FOR BRAIN TUMOR;  Surgeon: Vallarie Mare, MD;  Location: [facility];  Service: Neurosurgery;  Laterality: Left;  ? FOOT SURGERY Left   ? bone spur - local anesthesia  ? IR ANGIOGRAM PULMONARY BILATERAL SELECTIVE  12/15/2020  ? IR ANGIOGRAM SELECTIVE EACH ADDITIONAL VESSEL  12/15/2020  ? IR ANGIOGRAM SELECTIVE EACH ADDITIONAL VESSEL  12/15/2020  ? IR IVC FILTER PLMT / S&I /IMG GUID/MOD SED  12/15/2020  ? IR THROMBECT PRIM MECH INIT (INCLU) MOD SED  12/15/2020  ? IR THROMBECT PRIM MECH INIT (INCLU) MOD SED  12/15/2020  ? RADIOLOGY WITH ANESTHESIA N/A 12/15/2020  ? Procedure: IR WITH ANESTHESIA;  Surgeon: Radiologist, Medication, MD;  Location: [facility];  Service: Radiology;  Laterality: N/A;  ? TONSILECTOMY/ADENOIDECTOMY WITH MYRINGOTOMY    ? tonsils and adenoids out only  ? WISDOM TOOTH EXTRACTION    ? ?Social History:  ?Social History  ? ?Socioeconomic History  ? Marital status: Married  ?  Spouse name: Not on file  ? Number of children: 3  ? Years of education: Not on file  ? Highest education level: Not on file  ?Occupational History  ? Not on file  ?Tobacco Use  ? Smoking status: Former  ?  Years: 2.00  ?  Types: Cigarettes  ?  Quit date: 11/05/1979  ?  Years since quitting: 42.3  ? Smokeless tobacco: Never  ? Tobacco comments:  ?  1/2 pack a week  ?Vaping Use  ? Vaping Use: Never used  ?Substance and Sexual Activity  ? Alcohol use: Yes  ?  Alcohol/week: 0.0 standard drinks  ?  Comment: 1 glass of wine weekly  ? Drug use: No  ? Sexual activity: Yes  ?  Birth control/protection: Surgical  ?Other Topics Concern  ? Not on file  ?Social History Narrative  ? Not on file   ? ?Social Determinants of Health  ? ?Financial Resource Strain: Not on file  ?Food Insecurity: Not on file  ?Transportation Needs: Not on file  ?Physical Activity: Not on file  ?Stress: Not on file  ?Social Connections: Not on file  ?Intimate Partner Violence: Not on file  ? ?Family History:  ?Family History  ?Problem Relation Age of Onset  ? Hypertension Mother   ? Stroke Mother   ? Diabetes Mother   ? Colon cancer Father 10  ? Hypertension Sister   ? Colon polyps Sister   ? Heart disease Brother 53  ? Diabetes Maternal Uncle   ? Diabetes Maternal Uncle   ? ? ?Review of Systems: ?Constitutional: Doesn't report fevers, chills  or abnormal weight loss ?Eyes: Doesn't report blurriness of vision ?Ears, nose, mouth, throat, and face: Doesn't report sore throat ?Respiratory: Chest wall pain ?Cardiovascular: Doesn't report palpitation, chest discomfort  ?Gastrointestinal:  Doesn't report nausea, constipation, diarrhea ?GU: Doesn't report incontinence ?Skin: Doesn't report skin rashes ?Neurological: Per HPI ?Musculoskeletal: normal ?Behavioral/Psych: Doesn't report anxiety ? ?Physical Exam: ?Vitals:  ? 03/05/22 1018  ?BP: 127/89  ?Pulse: 74  ?Resp: 18  ?Temp: 97.7 ?F (36.5 ?C)  ?SpO2: 98%  ? ?KPS: 60. ?General: Alert, cooperative, pleasant, in no acute distress ?Head: Normal ?EENT: No conjunctival injection or scleral icterus.  ?Lungs: Resp effort normal ?Cardiac: Regular rate ?Abdomen: Non-distended abdomen ?Skin: No rashes cyanosis or petechiae. ?Extremities: No clubbing or edema ? ?Neurologic Exam: ?Mental Status: Awake, alert, attentive to examiner. Both receptive and expressive dysphasia.  ?Cranial Nerves: Visual acuity is grossly normal. Visual fields are full. Extra-ocular movements intact. No ptosis. Face is symmetric ?Motor: Tone and bulk are normal. Right leg 3/5, left leg 4/5, proximal greater than distal weakness.  Noted R leg flexor spasticity. Reflexes are symmetric, no pathologic reflexes present.  ?Sensory:  Intact to light touch ?Gait: Deferred today ? ?Labs: ?I have reviewed the data as listed ?   ?Component Value Date/Time  ? NA 143 02/12/2022 1006  ? K 3.7 02/12/2022 1006  ? CL 108 02/12/2022 1006  ? CO2

## 2022-03-05 NOTE — Telephone Encounter (Signed)
Per Dr. Mickeal Skinner, he spoke with pt's husband Lanny Hurst regarding a picc line, etc. The husband did not consent to picc.  She has not had much PO intake recently, has had diarrhea, and now has a UTI. Dr. Mickeal Skinner wants to give peripheral access one more try after abx are completed in one week. Message regarding lab, MD visit and infusion appointment has been sent to scheduling to setup to be 1 week out or so.  ?

## 2022-03-05 NOTE — Progress Notes (Signed)
Unable to obtain vascular access for tx today, Dr. Mickeal Skinner informed.  Order for PICC line entered by MD.  Today's infusion to be rescheduled. ?

## 2022-03-06 ENCOUNTER — Other Ambulatory Visit (HOSPITAL_COMMUNITY): Payer: Self-pay

## 2022-03-07 ENCOUNTER — Other Ambulatory Visit (HOSPITAL_COMMUNITY): Payer: BC Managed Care – PPO

## 2022-03-07 LAB — URINE CULTURE: Culture: >=100000 — AB

## 2022-03-12 ENCOUNTER — Inpatient Hospital Stay: Payer: BC Managed Care – PPO | Admitting: Internal Medicine

## 2022-03-12 ENCOUNTER — Inpatient Hospital Stay: Payer: BC Managed Care – PPO

## 2022-03-12 ENCOUNTER — Other Ambulatory Visit: Payer: Self-pay

## 2022-03-12 ENCOUNTER — Other Ambulatory Visit (HOSPITAL_COMMUNITY): Payer: Self-pay

## 2022-03-12 VITALS — BP 130/94 | HR 86 | Temp 98.8°F | Resp 16

## 2022-03-12 DIAGNOSIS — C719 Malignant neoplasm of brain, unspecified: Secondary | ICD-10-CM

## 2022-03-12 DIAGNOSIS — C711 Malignant neoplasm of frontal lobe: Secondary | ICD-10-CM | POA: Diagnosis not present

## 2022-03-12 LAB — CMP (CANCER CENTER ONLY)
ALT: 29 U/L (ref 0–44)
AST: 36 U/L (ref 15–41)
Albumin: 3.8 g/dL (ref 3.5–5.0)
Alkaline Phosphatase: 64 U/L (ref 38–126)
Anion gap: 11 (ref 5–15)
BUN: 11 mg/dL (ref 8–23)
CO2: 23 mmol/L (ref 22–32)
Calcium: 9.5 mg/dL (ref 8.9–10.3)
Chloride: 109 mmol/L (ref 98–111)
Creatinine: 0.63 mg/dL (ref 0.44–1.00)
GFR, Estimated: >60 mL/min (ref >60)
Glucose, Bld: 100 mg/dL — ABNORMAL HIGH (ref 70–99)
Potassium: 3.6 mmol/L (ref 3.5–5.1)
Sodium: 143 mmol/L (ref 135–145)
Total Bilirubin: 0.6 mg/dL (ref 0.3–1.2)
Total Protein: 7.1 g/dL (ref 6.5–8.1)

## 2022-03-12 LAB — CBC WITH DIFFERENTIAL (CANCER CENTER ONLY)
Abs Immature Granulocytes: 0.01 10*3/uL (ref 0.00–0.07)
Basophils Absolute: 0 10*3/uL (ref 0.0–0.1)
Basophils Relative: 0 %
Eosinophils Absolute: 0.1 10*3/uL (ref 0.0–0.5)
Eosinophils Relative: 2 %
HCT: 42.2 % (ref 36.0–46.0)
Hemoglobin: 14.4 g/dL (ref 12.0–15.0)
Immature Granulocytes: 0 %
Lymphocytes Relative: 22 %
Lymphs Abs: 0.6 10*3/uL — ABNORMAL LOW (ref 0.7–4.0)
MCH: 34 pg (ref 26.0–34.0)
MCHC: 34.1 g/dL (ref 30.0–36.0)
MCV: 99.8 fL (ref 80.0–100.0)
Monocytes Absolute: 0.3 10*3/uL (ref 0.1–1.0)
Monocytes Relative: 11 %
Neutro Abs: 1.9 10*3/uL (ref 1.7–7.7)
Neutrophils Relative %: 65 %
Platelet Count: 99 10*3/uL — ABNORMAL LOW (ref 150–400)
RBC: 4.23 MIL/uL (ref 3.87–5.11)
RDW: 12.8 % (ref 11.5–15.5)
WBC Count: 2.9 10*3/uL — ABNORMAL LOW (ref 4.0–10.5)
nRBC: 0 % (ref 0.0–0.2)

## 2022-03-12 LAB — TOTAL PROTEIN, URINE DIPSTICK: Protein, ur: <30 mg/dL — AB

## 2022-03-12 MED ORDER — LOMUSTINE 40 MG PO CAPS
40.0000 mg | ORAL_CAPSULE | Freq: Once | ORAL | 0 refills | Status: AC
  Filled 2022-03-12 – 2022-03-13 (×2): qty 1, 1d supply, fill #0

## 2022-03-12 MED ORDER — SODIUM CHLORIDE 0.9 % IV SOLN
600.0000 mg | Freq: Once | INTRAVENOUS | Status: AC
  Administered 2022-03-12: 600 mg via INTRAVENOUS
  Filled 2022-03-12: qty 16

## 2022-03-12 MED ORDER — SODIUM CHLORIDE 0.9 % IV SOLN: Freq: Once | INTRAVENOUS | Status: AC

## 2022-03-12 MED ORDER — LOMUSTINE 100 MG PO CAPS
100.0000 mg | ORAL_CAPSULE | Freq: Once | ORAL | 0 refills | Status: AC
  Filled 2022-03-12 – 2022-03-13 (×2): qty 1, 1d supply, fill #0

## 2022-03-12 MED ORDER — LOMUSTINE 10 MG PO CAPS
10.0000 mg | ORAL_CAPSULE | Freq: Once | ORAL | 0 refills | Status: AC
  Filled 2022-03-12 – 2022-03-13 (×2): qty 1, 1d supply, fill #0

## 2022-03-12 NOTE — Progress Notes (Signed)
? ?[facility] [address]  ?[place], [place] [address] ?[phone] ? ? ?Interval Evaluation ? ?Date of Service: 03/12/22 ?Patient Name: Hannah [name] ?Patient MRN: [id number] ?Patient DOB: 1961-07-16 ?Provider: [name], MD ? ?Identifying Statement:  ?Hannah [name] is a 61 y.o. female with left frontal glioblastoma  ? ?Oncologic History: ?Oncology History  ?GBM (glioblastoma multiforme) ([place])  ? Initial Diagnosis  ? GBM (glioblastoma multiforme) ([place]) ?  ?02/17/2020 Surgery  ? Craniotomy, left frontal resection by Dr. Marcello Moores.  Path is glioblastoma. ?  ?08/28/2020 - 10/09/2020 Radiation Therapy  ? Radiation and concurrent Temozolomide 77m/m2 daily ?  ?11/10/2020 - 12/09/2020 Chemotherapy  ? Completes 1 cycle of 5-day Temozolomide ?  ? ?  [facility] for [facility], undergoes thrombectomy and IVC filter placement ?  ?03/09/2021 Progression  ?  ?  ?03/27/2021 -  Chemotherapy  ? Initiates second line therapy with Avastin 111mkg IV q2 weeks ? ?  ?08/08/2021 - 08/09/2021 Chemotherapy  ? Patient is on Treatment Plan : BRAIN GLIOBLASTOMA Consolidation Temozolomide Days 1-5 q28 Days   ? ?  ?  ?12/03/2021 -  Chemotherapy  ? Patient is on Treatment Plan : BRAIN Lomustine q42d  ? ?  ?  ? ? ?Biomarkers: ? ?MGMT Methylated.  ?IDH 1/2 Wild type.  ?EGFR Unknown  ?TERT Unknown  ? ?Interval History: ?Hannah [name] presents today for avastin infusion, now in midst of CCNU cycle #2.  Vimpat was decreased to 10055mwice per day, she seems a "little sharper" again per husband.  No significant changes with ongoing fatigue, lethargy, continues reliance on family for functional support.  No other new or progressive complaints today.   ? ?Decadron ?11/09/20: 2mg94m2/10/22: 8mg 7m/21/22: 6mg ?21m07/22: 4mg ?065m9/22: 8mg ?0455m/22: 6mg ?05/62m22: 3mg ?05/25m2: 2mg ?06/0652m: 1.5mg ?06/20/6m - ? ?H+P (01/20/20) Patient presented one month ago with sudden onset  left arm and leg weakness and interrupted speech, [name], [name], stroke eval and CNS imaging which demonstrated a non-enhancing left frontal mass.  At this time she is back to baseline without recurrence of events, taking Keppra 500mg twice p15may.  No history or seizure or any other neurologic events.  She does describe being sleep deprived prior to seizure event.  She presents today after one month follow up MRI scan. ? ?Medications: ?Current Outpatient Medications on File Prior to Visit  ?Medication Sig Dispense Refill  ? amLODipine (NORVASC) 5 MG tablet Take 1 tablet (5 mg total) by mouth daily. 30 tablet 3  ? apixaban (ELIQUIS) 5 MG TABS tablet TAKE 1 TABLET (5 MG TOTAL) BY MOUTH 2 (TWO) TIMES DAILY. 180 tablet 1  ? Baclofen 5 MG TABS Take 1 tablet (5 mg) by mouth 3 (three) times daily. 90 tablet 3  ? cholecalciferol (VITAMIN D3) 25 MCG (1000 UNIT) tablet Take 1,000 Units by mouth daily.    ? ciprofloxacin (CIPRO) 500 MG tablet Take 1 tablet (500 mg total) by mouth 2 (two) times daily. 14 tablet 0  ? docusate sodium (COLACE) 100 MG capsule Take 100 mg by mouth 2 (two) times daily as needed for mild constipation.    ? Lacosamide 100 MG TABS Take 1 tablet by mouth in the morning (100mg) and 2 t67mts by mouth at bedtime (200mg) 90 table2m  3  ? methylphenidate (RITALIN) 5 MG tablet Take 1 tablet (5 mg total) by mouth 2 (two) times daily. (Patient not taking: Reported on 01/01/2022) 60 tablet 0  ? [DISCONTINUED] lamoTRIgine (LAMICTAL) 100 MG tablet Take 1 tablet (100 mg total) by mouth daily. 30 tablet 3  ? ?No current facility-administered medications on file prior to visit.  ? ? ?Allergies: No Known Allergies ?Past Medical History:  ?Past Medical History:  ?Diagnosis Date  ? Cervical spondylolysis 12/15/2019  ? Skeleton: Mild cervical spondylosis C6-7. Incidental find.   ? Colon polyp   ?  GBM (glioblastoma multiforme) ([facility])   ? Left ankle sprain 10/07/2011  ? Seizure ([facility])   ? partial   ? Tick bite 02/2020  ? ?Past Surgical History:  ?Past Surgical History:  ?Procedure Laterality Date  ? APPLICATION OF CRANIAL NAVIGATION Left 02/16/2020  ? Procedure: APPLICATION OF CRANIAL NAVIGATION;  Surgeon: Vallarie Mare, MD;  Location: [facility];  Service: Neurosurgery;  Laterality: Left;  ? CRANIOTOMY Left 02/16/2020  ? Procedure: LEFT FRONTAL CRANIOTOMY FOR BRAIN TUMOR;  Surgeon: Vallarie Mare, MD;  Location: [facility];  Service: Neurosurgery;  Laterality: Left;  ? FOOT SURGERY Left   ? bone spur - local anesthesia  ? IR ANGIOGRAM PULMONARY BILATERAL SELECTIVE  12/15/2020  ? IR ANGIOGRAM SELECTIVE EACH ADDITIONAL VESSEL  12/15/2020  ? IR ANGIOGRAM SELECTIVE EACH ADDITIONAL VESSEL  12/15/2020  ? IR IVC FILTER PLMT / S&I /IMG GUID/MOD SED  12/15/2020  ? IR THROMBECT PRIM MECH INIT (INCLU) MOD SED  12/15/2020  ? IR THROMBECT PRIM MECH INIT (INCLU) MOD SED  12/15/2020  ? RADIOLOGY WITH ANESTHESIA N/A 12/15/2020  ? Procedure: IR WITH ANESTHESIA;  Surgeon: Radiologist, Medication, MD;  Location: [facility];  Service: Radiology;  Laterality: N/A;  ? TONSILECTOMY/ADENOIDECTOMY WITH MYRINGOTOMY    ? tonsils and adenoids out only  ? WISDOM TOOTH EXTRACTION    ? ?Social History:  ?Social History  ? ?Socioeconomic History  ? Marital status: Married  ?  Spouse name: Not on file  ? Number of children: 3  ? Years of education: Not on file  ? Highest education level: Not on file  ?Occupational History  ? Not on file  ?Tobacco Use  ? Smoking status: Former  ?  Years: 2.00  ?  Types: Cigarettes  ?  Quit date: 11/05/1979  ?  Years since quitting: 42.3  ? Smokeless tobacco: Never  ? Tobacco comments:  ?  1/2 pack a week  ?Vaping Use  ? Vaping Use: Never used  ?Substance and Sexual Activity  ? Alcohol use: Yes  ?  Alcohol/week: 0.0 standard drinks  ?  Comment: 1 glass of wine weekly  ? Drug use: No  ? Sexual activity: Yes  ?  Birth  control/protection: Surgical  ?Other Topics Concern  ? Not on file  ?Social History Narrative  ? Not on file  ? ?Social Determinants of Health  ? ?Financial Resource Strain: Not on file  ?Food Insecurity: Not on file  ?Transportation Needs: Not on file  ?Physical Activity: Not on file  ?Stress: Not on file  ?Social Connections: Not on file  ?Intimate Partner Violence: Not on file  ? ?Family History:  ?Family History  ?Problem Relation Age of Onset  ? Hypertension Mother   ? Stroke Mother   ? Diabetes Mother   ? Colon cancer Father 35  ? Hypertension Sister   ? Colon polyps Sister   ? Heart disease Brother 51  ?  Diabetes Maternal Uncle   ? Diabetes Maternal Uncle   ? ? ?Review of Systems: ?Constitutional: Doesn't report fevers, chills or abnormal weight loss ?Eyes: Doesn't report blurriness of vision ?Ears, nose, mouth, throat, and face: Doesn't report sore throat ?Respiratory: Chest wall pain ?Cardiovascular: Doesn't report palpitation, chest discomfort  ?Gastrointestinal:  Doesn't report nausea, constipation, diarrhea ?GU: Doesn't report incontinence ?Skin: Doesn't report skin rashes ?Neurological: Per HPI ?Musculoskeletal: normal ?Behavioral/Psych: Doesn't report anxiety ? ?Physical Exam: ?Vitals:  ? 03/12/22 1000  ?BP: (!) 130/94  ?Pulse: 86  ?Resp: 16  ?Temp: 98.8 ?F (37.1 ?C)  ?SpO2: 97%  ? ?KPS: 60. ?General: Alert, cooperative, pleasant, in no acute distress ?Head: Normal ?EENT: No conjunctival injection or scleral icterus.  ?Lungs: Resp effort normal ?Cardiac: Regular rate ?Abdomen: Non-distended abdomen ?Skin: No rashes cyanosis or petechiae. ?Extremities: No clubbing or edema ? ?Neurologic Exam: ?Mental Status: Awake, alert, attentive to examiner. Both receptive and expressive dysphasia.  ?Cranial Nerves: Visual acuity is grossly normal. Visual fields are full. Extra-ocular movements intact. No ptosis. Face is symmetric ?Motor: Tone and bulk are normal. Right leg 3/5, left leg 4/5, proximal greater than  distal weakness.  Noted R leg flexor spasticity. Reflexes are symmetric, no pathologic reflexes present.  ?Sensory: Intact to light touch ?Gait: Deferred today ? ?Labs: ?I have reviewed the data as listed ?   ?Component

## 2022-03-12 NOTE — Patient Instructions (Signed)
[facility]  Discharge Instructions: Thank you for choosing [facility] to provide your oncology and hematology care.   If you have a lab appointment with the Cancer Center, please go directly to the Cancer Center and check in at the registration area.   Wear comfortable clothing and clothing appropriate for easy access to any Portacath or PICC line.   We strive to give you quality time with your provider. You may need to reschedule your appointment if you arrive late (15 or more minutes).  Arriving late affects you and other patients whose appointments are after yours.  Also, if you miss three or more appointments without notifying the office, you may be dismissed from the clinic at the provider's discretion.      For prescription refill requests, have your pharmacy contact our office and allow 72 hours for refills to be completed.    Today you received the following chemotherapy and/or immunotherapy agents [id number]   To help prevent nausea and vomiting after your treatment, we encourage you to take your nausea medication as directed.  BELOW ARE SYMPTOMS THAT SHOULD BE REPORTED IMMEDIATELY: . *FEVER GREATER THAN 100.4 F (38 C) OR HIGHER . *CHILLS OR SWEATING . *NAUSEA AND VOMITING THAT IS NOT CONTROLLED WITH YOUR NAUSEA MEDICATION . *UNUSUAL SHORTNESS OF BREATH . *UNUSUAL BRUISING OR BLEEDING . *URINARY PROBLEMS (pain or burning when urinating, or frequent urination) . *BOWEL PROBLEMS (unusual diarrhea, constipation, pain near the anus) . TENDERNESS IN MOUTH AND THROAT WITH OR WITHOUT PRESENCE OF ULCERS (sore throat, sores in mouth, or a toothache) . UNUSUAL RASH, SWELLING OR PAIN  . UNUSUAL VAGINAL DISCHARGE OR ITCHING   Items with * indicate a potential emergency and should be followed up as soon as possible or go to the Emergency Department if any problems should occur.  Please show the CHEMOTHERAPY ALERT CARD or IMMUNOTHERAPY  ALERT CARD at check-in to the Emergency Department and triage nurse.  Should you have questions after your visit or need to cancel or reschedule your appointment, please contact [facility]  Dept: [phone]  and follow the prompts.  Office hours are 8:00 a.m. to 4:30 p.m. Monday - Friday. Please note that voicemails left after 4:00 p.m. may not be returned until the following business day.  We are closed weekends and major holidays. You have access to a nurse at all times for urgent questions. Please call the main number to the clinic Dept: [phone] and follow the prompts.   For any non-urgent questions, you may also contact your provider using MyChart. We now offer e-Visits for anyone 18 and older to request care online for non-urgent symptoms. For details visit [email or web address].   Also download the MyChart app! Go to the app store, search "MyChart", open the app, select [facility], and log in with your MyChart username and password.  Due to Covid, a mask is required upon entering the hospital/clinic. If you do not have a mask, one will be given to you upon arrival. For doctor visits, patients may have 1 support person aged 18 or older with them. For treatment visits, patients cannot have anyone with them due to current Covid guidelines and our immunocompromised population.   

## 2022-03-13 ENCOUNTER — Other Ambulatory Visit (HOSPITAL_COMMUNITY): Payer: Self-pay

## 2022-03-13 ENCOUNTER — Telehealth: Payer: Self-pay | Admitting: Internal Medicine

## 2022-03-13 NOTE — Telephone Encounter (Signed)
Per 5/9 los called and spoke to pt husband about appointment for lab visit and in fusion  pt husband confirmed appointment  ?

## 2022-03-18 ENCOUNTER — Other Ambulatory Visit (HOSPITAL_COMMUNITY): Payer: Self-pay

## 2022-03-26 ENCOUNTER — Other Ambulatory Visit: Payer: BC Managed Care – PPO

## 2022-03-26 ENCOUNTER — Ambulatory Visit: Payer: BC Managed Care – PPO | Admitting: Internal Medicine

## 2022-03-26 ENCOUNTER — Ambulatory Visit: Payer: BC Managed Care – PPO

## 2022-03-27 ENCOUNTER — Other Ambulatory Visit (HOSPITAL_COMMUNITY): Payer: Self-pay

## 2022-04-02 ENCOUNTER — Other Ambulatory Visit: Payer: Self-pay

## 2022-04-02 ENCOUNTER — Inpatient Hospital Stay: Payer: BC Managed Care – PPO | Admitting: Internal Medicine

## 2022-04-02 ENCOUNTER — Inpatient Hospital Stay: Payer: BC Managed Care – PPO

## 2022-04-02 VITALS — BP 154/97 | HR 97 | Temp 97.2°F | Resp 18 | Wt 143.9 lb

## 2022-04-02 DIAGNOSIS — C711 Malignant neoplasm of frontal lobe: Secondary | ICD-10-CM | POA: Diagnosis not present

## 2022-04-02 DIAGNOSIS — C719 Malignant neoplasm of brain, unspecified: Secondary | ICD-10-CM

## 2022-04-02 DIAGNOSIS — G40909 Epilepsy, unspecified, not intractable, without status epilepticus: Secondary | ICD-10-CM

## 2022-04-02 LAB — CBC WITH DIFFERENTIAL (CANCER CENTER ONLY)
Abs Immature Granulocytes: 0.01 10*3/uL (ref 0.00–0.07)
Basophils Absolute: 0 10*3/uL (ref 0.0–0.1)
Basophils Relative: 1 %
Eosinophils Absolute: 0 10*3/uL (ref 0.0–0.5)
Eosinophils Relative: 1 %
HCT: 43.1 % (ref 36.0–46.0)
Hemoglobin: 14.8 g/dL (ref 12.0–15.0)
Immature Granulocytes: 1 %
Lymphocytes Relative: 20 %
Lymphs Abs: 0.4 10*3/uL — ABNORMAL LOW (ref 0.7–4.0)
MCH: 34.9 pg — ABNORMAL HIGH (ref 26.0–34.0)
MCHC: 34.3 g/dL (ref 30.0–36.0)
MCV: 101.7 fL — ABNORMAL HIGH (ref 80.0–100.0)
Monocytes Absolute: 0.3 10*3/uL (ref 0.1–1.0)
Monocytes Relative: 15 %
Neutro Abs: 1.2 10*3/uL — ABNORMAL LOW (ref 1.7–7.7)
Neutrophils Relative %: 62 %
Platelet Count: 170 10*3/uL (ref 150–400)
RBC: 4.24 MIL/uL (ref 3.87–5.11)
RDW: 13.3 % (ref 11.5–15.5)
WBC Count: 1.9 10*3/uL — ABNORMAL LOW (ref 4.0–10.5)
nRBC: 0 % (ref 0.0–0.2)

## 2022-04-02 LAB — CMP (CANCER CENTER ONLY)
ALT: 17 U/L (ref 0–44)
AST: 23 U/L (ref 15–41)
Albumin: 4.3 g/dL (ref 3.5–5.0)
Alkaline Phosphatase: 68 U/L (ref 38–126)
Anion gap: 8 (ref 5–15)
BUN: 12 mg/dL (ref 8–23)
CO2: 27 mmol/L (ref 22–32)
Calcium: 10.2 mg/dL (ref 8.9–10.3)
Chloride: 106 mmol/L (ref 98–111)
Creatinine: 0.67 mg/dL (ref 0.44–1.00)
GFR, Estimated: >60 mL/min (ref >60)
Glucose, Bld: 112 mg/dL — ABNORMAL HIGH (ref 70–99)
Potassium: 3.7 mmol/L (ref 3.5–5.1)
Sodium: 141 mmol/L (ref 135–145)
Total Bilirubin: 0.6 mg/dL (ref 0.3–1.2)
Total Protein: 7.3 g/dL (ref 6.5–8.1)

## 2022-04-02 MED ORDER — SODIUM CHLORIDE 0.9 % IV SOLN: Freq: Once | INTRAVENOUS | Status: AC

## 2022-04-02 MED ORDER — SODIUM CHLORIDE 0.9 % IV SOLN
600.0000 mg | Freq: Once | INTRAVENOUS | Status: AC
  Administered 2022-04-02: 600 mg via INTRAVENOUS
  Filled 2022-04-02: qty 16

## 2022-04-02 NOTE — Progress Notes (Signed)
Per Dr Mickeal Skinner ok to treat with Avastin today 04/02/2022 with labs and vitals.

## 2022-04-02 NOTE — Progress Notes (Signed)
No Urine Protein required per Dr Mickeal Skinner.

## 2022-04-02 NOTE — Progress Notes (Signed)
[facility] at [address], [place] [address] [phone]   Interval Evaluation  Date of Service: 04/02/22 Patient Name: Hannah [name] Patient MRN: [id number] Patient DOB: [birth year] Provider: [name], MD  Identifying Statement:  Hannah [name] is a 61 y.o. female with left frontal glioblastoma   Oncologic History: Oncology History  GBM (glioblastoma multiforme) ([facility])   Initial Diagnosis   GBM (glioblastoma multiforme) ([facility])   02/17/2020 Surgery   Craniotomy, left frontal resection by Dr. Marcello Moores.  Path is glioblastoma.   08/28/2020 - 10/09/2020 Radiation Therapy   Radiation and concurrent Temozolomide 34m/m2 daily   11/10/2020 - 12/09/2020 Chemotherapy   Completes 1 cycle of 5-day Temozolomide      [facility]   Admitted for saddle PE, undergoes thrombectomy and IVC filter placement   03/09/2021 Progression      03/27/2021 -  Chemotherapy   Initiates second line therapy with Avastin 159mkg IV q2 weeks    08/08/2021 - 08/09/2021 Chemotherapy   Patient is on Treatment Plan : BRAIN GLIOBLASTOMA Consolidation Temozolomide Days 1-5 q28 Days       12/03/2021 -  Chemotherapy   Patient is on Treatment Plan : BRAIN Lomustine q42d        Biomarkers:  MGMT Methylated.  IDH 1/2 Wild type.  EGFR Unknown  TERT Unknown   Interval History: Hannah [name] presents today for avastin infusion.  She dosed CCNU cycle #3 on 03/25/22.  Doing well with Vimpat 10049mD, no seizures.  No significant changes with ongoing fatigue, lethargy, continues reliance on family for functional support.  No other new or progressive complaints today.    Decadron 11/09/20: 2mg13m/10/22: 8mg 100m21/22: 6mg 021m7/22: 4mg 0318m/22: 8mg 04/22m22: 6mg 05/157m2: 3mg 05/2352m: 2mg 06/06/16m 1.5mg 06/20/257m-  H+P (01/20/20) Patient presented one month ago with sudden onset left arm and leg weakness and interrupted  speech, [name], [name], stroke eval and CNS imaging which demonstrated a non-enhancing left frontal mass.  At this time she is back to baseline without recurrence of events, taking Keppra 500mg twice p50may.  No history or seizure or any other neurologic events.  She does describe being sleep deprived prior to seizure event.  She presents today after one month follow up MRI scan.  Medications: Current Outpatient Medications on File Prior to Visit  Medication Sig Dispense Refill   amLODipine (NORVASC) 5 MG tablet Take 1 tablet (5 mg total) by mouth daily. 30 tablet 3   apixaban (ELIQUIS) 5 MG TABS tablet TAKE 1 TABLET (5 MG TOTAL) BY MOUTH 2 (TWO) TIMES DAILY. 180 tablet 1   Baclofen 5 MG TABS Take 1 tablet (5 mg) by mouth 3 (three) times daily. 90 tablet 3   cholecalciferol (VITAMIN D3) 25 MCG (1000 UNIT) tablet Take 1,000 Units by mouth daily.     docusate sodium (COLACE) 100 MG capsule Take 100 mg by mouth 2 (two) times daily as needed for mild constipation.     Lacosamide 100 MG TABS Take 1 tablet by mouth in the morning (100mg) and 2 t46mts by mouth at bedtime (200mg) 90 table40m  [DISCONTINUED] lamoTRIgine (LAMICTAL) 100 MG tablet Take 1 tablet (100 mg total) by mouth daily. 30 tablet 3   No current facility-administered medications on file prior to  visit.    Allergies: No Known Allergies Past Medical History:  Past Medical History:  Diagnosis Date   Cervical spondylolysis 12/15/2019   Skeleton: Mild cervical spondylosis C6-7. Incidental find.    Colon polyp    GBM (glioblastoma multiforme) ([facility])    Left ankle sprain 10/07/2011   Seizure ([place])    partial    Tick bite 02/2020   Past Surgical History:  Past Surgical History:  Procedure Laterality Date   APPLICATION OF CRANIAL NAVIGATION Left 02/16/2020   Procedure: APPLICATION OF CRANIAL NAVIGATION;   Surgeon: Vallarie Mare, MD;  Location: [facility];  Service: Neurosurgery;  Laterality: Left;   CRANIOTOMY Left 02/16/2020   Procedure: LEFT FRONTAL CRANIOTOMY FOR BRAIN TUMOR;  Surgeon: Vallarie Mare, MD;  Location: [facility];  Service: Neurosurgery;  Laterality: Left;   FOOT SURGERY Left    bone spur - local anesthesia   IR ANGIOGRAM PULMONARY BILATERAL SELECTIVE  12/15/2020   IR ANGIOGRAM SELECTIVE EACH ADDITIONAL VESSEL  12/15/2020   IR ANGIOGRAM SELECTIVE EACH ADDITIONAL VESSEL  12/15/2020   IR IVC FILTER PLMT / S&I /IMG GUID/MOD SED  12/15/2020   IR THROMBECT PRIM MECH INIT (INCLU) MOD SED  12/15/2020   IR THROMBECT PRIM MECH INIT (INCLU) MOD SED  12/15/2020   RADIOLOGY WITH ANESTHESIA N/A 12/15/2020   Procedure: IR WITH ANESTHESIA;  Surgeon: Radiologist, Medication, MD;  Location: [facility];  Service: Radiology;  Laterality: N/A;   TONSILECTOMY/ADENOIDECTOMY WITH MYRINGOTOMY     tonsils and adenoids out only   WISDOM TOOTH EXTRACTION     Social History:  Social History   Socioeconomic History   Marital status: Married    Spouse name: Not on file   Number of children: 3   Years of education: Not on file   Highest education level: Not on file  Occupational History   Not on file  Tobacco Use   Smoking status: Former    Years: 2.00    Types: Cigarettes    Quit date: 11/05/1979    Years since quitting: 42.4   Smokeless tobacco: Never   Tobacco comments:    1/2 pack a week  Vaping Use   Vaping Use: Never used  Substance and Sexual Activity   Alcohol use: Yes    Alcohol/week: 0.0 standard drinks    Comment: 1 glass of wine weekly   Drug use: No   Sexual activity: Yes    Birth control/protection: Surgical  Other Topics Concern   Not on file  Social History Narrative   Not on file   Social Determinants of Health   Financial Resource Strain: Not on file  Food Insecurity: Not on file  Transportation Needs: Not on file  Physical Activity: Not on file  Stress: Not on file   Social Connections: Not on file  Intimate Partner Violence: Not on file   Family History:  Family History  Problem Relation Age of Onset   Hypertension Mother    Stroke Mother    Diabetes Mother    Colon cancer Father 4   Hypertension Sister    Colon polyps Sister    Heart disease Brother 38   Diabetes Maternal Uncle    Diabetes Maternal Uncle     Review of Systems: Constitutional: Doesn't report fevers, chills or abnormal weight loss Eyes: Doesn't report blurriness of vision Ears, nose, mouth, throat, and face: Doesn't report sore throat Respiratory: Chest wall pain Cardiovascular: Doesn't report palpitation, chest discomfort  Gastrointestinal:  Doesn't report nausea, constipation,  diarrhea GU: Doesn't report incontinence Skin: Doesn't report skin rashes Neurological: Per HPI Musculoskeletal: normal Behavioral/Psych: Doesn't report anxiety  Physical Exam: Vitals:   04/02/22 1026  BP: (!) 154/97  Pulse: 97  Resp: 18  Temp: (!) 97.2 F (36.2 C)  SpO2: 98%   KPS: 60. General: Alert, cooperative, pleasant, in no acute distress Head: Normal EENT: No conjunctival injection or scleral icterus.  Lungs: Resp effort normal Cardiac: Regular rate Abdomen: Non-distended abdomen Skin: No rashes cyanosis or petechiae. Extremities: No clubbing or edema  Neurologic Exam: Mental Status: Awake, alert, attentive to examiner. Both receptive and expressive dysphasia.  Cranial Nerves: Visual acuity is grossly normal. Visual fields are full. Extra-ocular movements intact. No ptosis. Face is symmetric Motor: Tone and bulk are normal. R arm 1/5. Right leg 3/5, left leg 4/5, proximal greater than distal weakness.  Noted R leg flexor spasticity. Reflexes are symmetric, no pathologic reflexes present.  Sensory: Intact to light touch Gait: Deferred today  Labs: I have reviewed the data as listed    Component Value Date/Time   NA 141 04/02/2022 0941   K 3.7 04/02/2022 0941   CL  106 04/02/2022 0941   CO2 27 04/02/2022 0941   GLUCOSE 112 (H) 04/02/2022 0941   GLUCOSE 85 10/13/2006 1158   BUN 12 04/02/2022 0941   CREATININE 0.67 04/02/2022 0941   CALCIUM 10.2 04/02/2022 0941   PROT 7.3 04/02/2022 0941   ALBUMIN 4.3 04/02/2022 0941   AST 23 04/02/2022 0941   ALT 17 04/02/2022 0941   ALKPHOS 68 04/02/2022 0941   BILITOT 0.6 04/02/2022 0941   GFRNONAA >60 04/02/2022 0941   GFRAA >60 02/17/2020 0136   Lab Results  Component Value Date   WBC 1.9 (L) 04/02/2022   NEUTROABS 1.2 (L) 04/02/2022   HGB 14.8 04/02/2022   HCT 43.1 04/02/2022   MCV 101.7 (H) 04/02/2022   PLT 170 04/02/2022    Assessment/Plan GBM (glioblastoma multiforme) ([facility])  Seizure disorder ([place])  Hannah [name] is clinically stable today, now day 9/24 of cycle #3 CCNU/avastin.  No new or progressive changes today, labs demonstrate modest neutropenia, similar to prior cycle.  She may continue cycle #3 CCNU 51m/m2 q6 week, with concurrent avastin 176mkg q3 weeks.    Chemotherapy should be held for the following:  ANC less than 1,000  Platelets less than 100,000  LFT or creatinine greater than 2x ULN  If clinical concerns/contraindications develop  Avastin should be held for the following:  ANC less than 500  Platelets less than 50,000  LFT or creatinine greater than 2x ULN  If clinical concerns/contraindications develop  Will con't Norvasc 43m143maily.  Vimpat may be decreased to 100m56mmg, Eliquis as prior.   Baclofen con't 43mg 82mpRN.  We ask that Hannah [name] return to clinic in 3 weeks for next avastin infusion.  Next MRI scheduled for 05/11/22.   All questions were answered. The patient knows to call the clinic with any problems, questions or concerns. No barriers to learning were detected.  I have spent a total of 30 minutes of face-to-face and non-face-to-face time, excluding clinical staff time, preparing to see patient, ordering tests and/or medications,  counseling the patient, and independently interpreting results and communicating results to the patient/family/caregiver    ZachaVentura SellersMedical Director of Neuro-Oncology [facility] HospitalesleCoburn0/23 10:35 AM

## 2022-04-03 ENCOUNTER — Other Ambulatory Visit (HOSPITAL_COMMUNITY): Payer: Self-pay

## 2022-04-16 ENCOUNTER — Ambulatory Visit: Payer: BC Managed Care – PPO

## 2022-04-16 ENCOUNTER — Ambulatory Visit: Payer: BC Managed Care – PPO | Admitting: Internal Medicine

## 2022-04-16 ENCOUNTER — Other Ambulatory Visit: Payer: BC Managed Care – PPO

## 2022-04-19 ENCOUNTER — Other Ambulatory Visit (HOSPITAL_COMMUNITY): Payer: Self-pay

## 2022-04-22 ENCOUNTER — Other Ambulatory Visit (HOSPITAL_COMMUNITY): Payer: Self-pay

## 2022-04-22 ENCOUNTER — Ambulatory Visit: Payer: BC Managed Care – PPO | Admitting: Internal Medicine

## 2022-04-22 ENCOUNTER — Other Ambulatory Visit: Payer: Self-pay

## 2022-04-22 ENCOUNTER — Inpatient Hospital Stay (HOSPITAL_BASED_OUTPATIENT_CLINIC_OR_DEPARTMENT_OTHER): Payer: BC Managed Care – PPO | Admitting: Internal Medicine

## 2022-04-22 ENCOUNTER — Ambulatory Visit: Payer: BC Managed Care – PPO

## 2022-04-22 ENCOUNTER — Other Ambulatory Visit: Payer: Self-pay | Admitting: Lab

## 2022-04-22 ENCOUNTER — Inpatient Hospital Stay: Payer: BC Managed Care – PPO

## 2022-04-22 ENCOUNTER — Other Ambulatory Visit: Payer: BC Managed Care – PPO

## 2022-04-22 ENCOUNTER — Inpatient Hospital Stay: Payer: BC Managed Care – PPO | Attending: Internal Medicine

## 2022-04-22 VITALS — BP 138/94 | HR 87 | Temp 98.0°F | Resp 18 | Wt 143.0 lb

## 2022-04-22 VITALS — BP 122/81 | HR 70 | Resp 18

## 2022-04-22 DIAGNOSIS — C711 Malignant neoplasm of frontal lobe: Secondary | ICD-10-CM | POA: Diagnosis present

## 2022-04-22 DIAGNOSIS — C719 Malignant neoplasm of brain, unspecified: Secondary | ICD-10-CM

## 2022-04-22 DIAGNOSIS — G40909 Epilepsy, unspecified, not intractable, without status epilepticus: Secondary | ICD-10-CM | POA: Diagnosis not present

## 2022-04-22 DIAGNOSIS — Z9221 Personal history of antineoplastic chemotherapy: Secondary | ICD-10-CM | POA: Diagnosis not present

## 2022-04-22 DIAGNOSIS — Z79899 Other long term (current) drug therapy: Secondary | ICD-10-CM | POA: Diagnosis not present

## 2022-04-22 DIAGNOSIS — Z7901 Long term (current) use of anticoagulants: Secondary | ICD-10-CM | POA: Insufficient documentation

## 2022-04-22 DIAGNOSIS — Z5112 Encounter for antineoplastic immunotherapy: Secondary | ICD-10-CM | POA: Insufficient documentation

## 2022-04-22 DIAGNOSIS — Z79630 Long term (current) use of alkylating agent: Secondary | ICD-10-CM | POA: Diagnosis not present

## 2022-04-22 DIAGNOSIS — Z923 Personal history of irradiation: Secondary | ICD-10-CM | POA: Diagnosis not present

## 2022-04-22 LAB — CMP (CANCER CENTER ONLY)
ALT: 22 U/L (ref 0–44)
AST: 26 U/L (ref 15–41)
Albumin: 4.4 g/dL (ref 3.5–5.0)
Alkaline Phosphatase: 71 U/L (ref 38–126)
Anion gap: 11 (ref 5–15)
BUN: 11 mg/dL (ref 8–23)
CO2: 24 mmol/L (ref 22–32)
Calcium: 10 mg/dL (ref 8.9–10.3)
Chloride: 106 mmol/L (ref 98–111)
Creatinine: 0.65 mg/dL (ref 0.44–1.00)
GFR, Estimated: >60 mL/min (ref >60)
Glucose, Bld: 99 mg/dL (ref 70–99)
Potassium: 3.8 mmol/L (ref 3.5–5.1)
Sodium: 141 mmol/L (ref 135–145)
Total Bilirubin: 0.5 mg/dL (ref 0.3–1.2)
Total Protein: 7.5 g/dL (ref 6.5–8.1)

## 2022-04-22 LAB — CBC WITH DIFFERENTIAL (CANCER CENTER ONLY)
Abs Immature Granulocytes: 0.01 10*3/uL (ref 0.00–0.07)
Basophils Absolute: 0 10*3/uL (ref 0.0–0.1)
Basophils Relative: 1 %
Eosinophils Absolute: 0 10*3/uL (ref 0.0–0.5)
Eosinophils Relative: 1 %
HCT: 44.9 % (ref 36.0–46.0)
Hemoglobin: 15.7 g/dL — ABNORMAL HIGH (ref 12.0–15.0)
Immature Granulocytes: 0 %
Lymphocytes Relative: 15 %
Lymphs Abs: 0.5 10*3/uL — ABNORMAL LOW (ref 0.7–4.0)
MCH: 35.3 pg — ABNORMAL HIGH (ref 26.0–34.0)
MCHC: 35 g/dL (ref 30.0–36.0)
MCV: 100.9 fL — ABNORMAL HIGH (ref 80.0–100.0)
Monocytes Absolute: 0.5 10*3/uL (ref 0.1–1.0)
Monocytes Relative: 14 %
Neutro Abs: 2.5 10*3/uL (ref 1.7–7.7)
Neutrophils Relative %: 69 %
Platelet Count: 115 10*3/uL — ABNORMAL LOW (ref 150–400)
RBC: 4.45 MIL/uL (ref 3.87–5.11)
RDW: 13.4 % (ref 11.5–15.5)
WBC Count: 3.7 10*3/uL — ABNORMAL LOW (ref 4.0–10.5)
nRBC: 0 % (ref 0.0–0.2)

## 2022-04-22 LAB — TOTAL PROTEIN, URINE DIPSTICK: Protein, ur: 30 mg/dL — AB

## 2022-04-22 MED ORDER — SODIUM CHLORIDE 0.9 % IV SOLN: Freq: Once | INTRAVENOUS | Status: AC

## 2022-04-22 MED ORDER — SODIUM CHLORIDE 0.9 % IV SOLN
600.0000 mg | Freq: Once | INTRAVENOUS | Status: AC
  Administered 2022-04-22: 600 mg via INTRAVENOUS
  Filled 2022-04-22: qty 16

## 2022-04-22 NOTE — Progress Notes (Signed)
Hannah Hannah [name] at [facility] [place], [place] [address] [phone]   Interval Evaluation  Date of Service: 04/22/22 Patient Name: Hannah Hannah [name] Patient MRN: [id number] Patient DOB: Jul 29, 1961 Provider: [name], MD  Identifying Statement:  Hannah Hannah [name] is Hannah [name] 61 y.o. female with left frontal glioblastoma   Oncologic History: Oncology History  GBM (glioblastoma multiforme) (Hannah Hannah [name])   Initial Diagnosis   GBM (glioblastoma multiforme) (Hannah Hannah [name])   02/17/2020 Surgery   Craniotomy, left frontal resection by Dr. Marcello [name].  Path is glioblastoma.   08/28/2020 - 10/09/2020 Radiation Therapy   Radiation and concurrent Temozolomide 10m/m2 daily   11/10/2020 - 12/09/2020 Chemotherapy   Completes 1 cycle of 5-day Temozolomide      [facility]   Ad[facility] for [facility], undergoes thrombectomy and IVC filter placement   03/09/2021 Progression     03/27/2021 -  Chemotherapy   Initiates second line therapy with Avastin 16mkg IV q2 weeks    08/08/2021 - 08/09/2021 Chemotherapy   Patient is on Treatment Plan : BRAIN GLIOBLASTOMA Consolidation Temozolomide Days 1-5 q28 Days      12/03/2021 -  Chemotherapy   Patient is on Treatment Plan : BRAIN Lomustine q42d       Biomarkers:  MGMT Methylated.  IDH 1/2 Wild type.  EGFR Unknown  TERT Unknown   Interval History: Hannah Hannah [name] presents today for avastin infusion.  She dosed CCNU cycle #3 on 03/25/22.  No issues with the lower dose of vimpat, 100/50.  No significant changes with ongoing fatigue, lethargy, continues reliance on family for functional support.  No other new or progressive complaints today.    Decadron 11/09/20: 38m84m2/10/22: 8mg30m/21/22: 6mg 4m07/22: 4mg 068m9/22: 8mg 0425m/22: 6mg 05/26m22: 3mg 05/254m2: 38mg 06/0658m: 1.5mg 06/20/14m -  H+P (01/20/20) Patient presented one month ago with sudden onset left arm and leg weakness and interrupted speech,  [name], [name], stroke eval and CNS imaging which demonstrated Hannah [name] non-enhancing left frontal mass.  At this time she is back to baseline without recurrence of events, taking Keppra 500mg twice 69mday.  No history or seizure or any other neurologic events.  She does describe being sleep deprived prior to seizure event.  She presents today after one month follow up MRI scan.  Medications: Current Outpatient Medications on File Prior to Visit  Medication Sig Dispense Refill   apixaban (ELIQUIS) 5 MG TABS tablet TAKE 1 TABLET (5 MG TOTAL) BY MOUTH 2 (TWO) TIMES DAILY. 180 tablet 1   Baclofen 5 MG TABS Take 1 tablet (5 mg) by mouth 3 (three) times daily. 90 tablet 3   cholecalciferol (VITAMIN D3) 25 MCG (1000 UNIT) tablet Take 1,000 Units by mouth daily.     docusate sodium (COLACE) 100 MG capsule Take 100 mg by mouth 2 (two) times daily as needed for mild constipation.     Lacosamide 100 MG TABS Take 1 tablet by mouth in the morning (100mg) and 2 77mets by mouth at bedtime (200mg) 90 tabl438m   amLODipine (NORVASC) 5 MG tablet Take 1 tablet (5 mg total) by mouth daily. 30 tablet 3   [DISCONTINUED] lamoTRIgine (LAMICTAL) 100 MG tablet Take 1 tablet (100 mg total) by mouth daily. 30 tablet 3   No current facility-administered medications on file prior to visit.  Allergies: No Known Allergies Past Medical History:  Past Medical History:  Diagnosis Date   Cervical spondylolysis 12/15/2019   Skeleton: Mild cervical spondylosis C6-7. Incidental find.    Colon polyp    GBM (glioblastoma multiforme) (HCC)    Left ankle sprain 10/07/2011   Seizure ([place] [facility])    partial    Tick bite 02/2020   Past Surgical History:  Past Surgical History:  Procedure Laterality Date   APPLICATION OF CRANIAL NAVIGATION Left 02/16/2020   Procedure: APPLICATION OF CRANIAL NAVIGATION;  Surgeon:  Vallarie Mare, MD;  Location: [facility];  Service: Neurosurgery;  Laterality: Left;   CRANIOTOMY Left 02/16/2020   Procedure: LEFT FRONTAL CRANIOTOMY FOR BRAIN TUMOR;  Surgeon: Vallarie Mare, MD;  Location: [facility];  Service: Neurosurgery;  Laterality: Left;   FOOT SURGERY Left    bone spur - local anesthesia   IR ANGIOGRAM PULMONARY BILATERAL SELECTIVE  12/15/2020   IR ANGIOGRAM SELECTIVE EACH ADDITIONAL VESSEL  12/15/2020   IR ANGIOGRAM SELECTIVE EACH ADDITIONAL VESSEL  12/15/2020   IR IVC FILTER PLMT / S&I /IMG GUID/MOD SED  12/15/2020   IR THROMBECT PRIM MECH INIT (INCLU) MOD SED  12/15/2020   IR THROMBECT PRIM MECH INIT (INCLU) MOD SED  12/15/2020   RADIOLOGY WITH ANESTHESIA N/Hannah [name] 12/15/2020   Procedure: IR WITH ANESTHESIA;  Surgeon: Radiologist, Medication, MD;  Location: [facility];  Service: Radiology;  Laterality: N/Hannah [name];   TONSILECTOMY/ADENOIDECTOMY WITH MYRINGOTOMY     tonsils and adenoids out only   WISDOM TOOTH EXTRACTION     Social History:  Social History   Socioeconomic History   Marital status: Married    Spouse name: Not on file   Number of children: 3   Years of education: Not on file   Highest education level: Not on file  Occupational History   Not on file  Tobacco Use   Smoking status: Former    Years: 2.00    Types: Cigarettes    Quit date: 11/05/1979    Years since quitting: 42.4   Smokeless tobacco: Never   Tobacco comments:    1/2 pack Hannah [name] week  Vaping Use   Vaping Use: Never used  Substance and Sexual Activity   Alcohol use: Yes    Alcohol/week: 0.0 standard drinks of alcohol    Comment: 1 glass of wine weekly   Drug use: No   Sexual activity: Yes    Birth control/protection: Surgical  Other Topics Concern   Not on file  Social History Narrative   Not on file   Social Determinants of Health   Financial Resource Strain: Not on file  Food Insecurity: Not on file  Transportation Needs: Not on file  Physical Activity: Not on file  Stress: Not on file   Social Connections: Not on file  Intimate Partner Violence: Not on file   Family History:  Family History  Problem Relation Age of Onset   Hypertension Mother    Stroke Mother    Diabetes Mother    Colon cancer Father 23   Hypertension Sister    Colon polyps Sister    Heart disease Brother 39   Diabetes Maternal Uncle    Diabetes Maternal Uncle     Review of Systems: Constitutional: Doesn't report fevers, chills or abnormal weight loss Eyes: Doesn't report blurriness of vision Ears, nose, mouth, throat, and face: Doesn't report sore throat Respiratory: Chest wall pain Cardiovascular: Doesn't report palpitation, chest discomfort  Gastrointestinal:  Doesn't report nausea, constipation, diarrhea GU:  Doesn't report incontinence Skin: Doesn't report skin rashes Neurological: Per HPI Musculoskeletal: normal Behavioral/Psych: Doesn't report anxiety  Physical Exam: Vitals:   04/22/22 1230  BP: (!) 138/94  Pulse: 87  Resp: 18  Temp: 98 F (36.7 C)  SpO2: 98%   KPS: 60. General: Alert, cooperative, pleasant, in no acute distress Head: Normal EENT: No conjunctival injection or scleral icterus.  Lungs: Resp effort normal Cardiac: Regular rate Abdomen: Non-distended abdomen Skin: No rashes cyanosis or petechiae. Extremities: No clubbing or edema  Neurologic Exam: Mental Status: Awake, alert, attentive to examiner. Both receptive and expressive dysphasia.  Cranial Nerves: Visual acuity is grossly normal. Visual fields are full. Extra-ocular movements intact. No ptosis. Face is symmetric Motor: Tone and bulk are normal. R arm 1/5. Right leg 3/5, left leg 4/5, proximal greater than distal weakness.  Noted R leg flexor spasticity. Reflexes are symmetric, no pathologic reflexes present.  Sensory: Intact to light touch Gait: Deferred today  Labs: I have reviewed the data as listed    Component Value Date/Time   NA 141 04/02/2022 0941   K 3.7 04/02/2022 0941   CL 106  04/02/2022 0941   CO2 27 04/02/2022 0941   GLUCOSE 112 (H) 04/02/2022 0941   GLUCOSE 85 10/13/2006 1158   BUN 12 04/02/2022 0941   CREATININE 0.67 04/02/2022 0941   CALCIUM 10.2 04/02/2022 0941   PROT 7.3 04/02/2022 0941   ALBUMIN 4.3 04/02/2022 0941   AST 23 04/02/2022 0941   ALT 17 04/02/2022 0941   ALKPHOS 68 04/02/2022 0941   BILITOT 0.6 04/02/2022 0941   GFRNONAA >60 04/02/2022 0941   GFRAA >60 02/17/2020 0136   Lab Results  Component Value Date   WBC 3.7 (L) 04/22/2022   NEUTROABS 2.5 04/22/2022   HGB 15.7 (H) 04/22/2022   HCT 44.9 04/22/2022   MCV 100.9 (H) 04/22/2022   PLT 115 (L) 04/22/2022    Assessment/Plan GBM (glioblastoma multiforme) (HCC)  Seizure disorder (HCC)  Hannah Hannah [name] is clinically stable today, now day 31/42 of cycle #3 CCNU/avastin.  No new or progressive changes today; mild thrombocytopenia noted.  She may continue cycle #3 CCNU 67m/m2 q6 week, with concurrent avastin 152mkg q3 weeks.    Chemotherapy should be held for the following:  ANC less than 1,000  Platelets less than 100,000  LFT or creatinine greater than 2x ULN  If clinical concerns/contraindications develop  Avastin should be held for the following:  ANC less than 500  Platelets less than 50,000  LFT or creatinine greater than 2x ULN  If clinical concerns/contraindications develop  Will con't Norvasc 57m27maily.  Vimpat will con't 100m69mmg, Eliquis as prior.   Baclofen con't 57mg 64mpRN.  We ask that Hannah Hannah [name] return to clinic in 3 weeks with MRI brain for evaluation, prior to cycle #4.  All questions were answered. The patient knows to call the clinic with any problems, questions or concerns. No barriers to learning were detected.  I have spent Hannah [name] total of 30 minutes of face-to-face and non-face-to-face time, excluding clinical staff time, preparing to see patient, ordering tests and/or medications, counseling the patient, and independently interpreting  results and communicating results to the patient/family/caregiver    ZachaVentura SellersMedical Director of Neuro-Oncology [facility]field9/23 12:48 PM

## 2022-04-22 NOTE — Patient Instructions (Signed)
[facility]  Discharge Instructions: Thank you for choosing [facility] to provide your oncology and hematology care.   If you have a lab appointment with the [facility], please go directly to the [facility] and check in at the registration area.   Wear comfortable clothing and clothing appropriate for easy access to any Portacath or PICC line.   We strive to give you quality time with your provider. You may need to reschedule your appointment if you arrive late (15 or more minutes).  Arriving late affects you and other patients whose appointments are after yours.  Also, if you miss three or more appointments without notifying the office, you may be dismissed from the clinic at the provider's discretion.      For prescription refill requests, have your pharmacy contact our office and allow 72 hours for refills to be completed.    Today you received the following chemotherapy and/or immunotherapy agents MVASI      To help prevent nausea and vomiting after your treatment, we encourage you to take your nausea medication as directed.  BELOW ARE SYMPTOMS THAT SHOULD BE REPORTED IMMEDIATELY: *FEVER GREATER THAN 100.4 F (38 C) OR HIGHER *CHILLS OR SWEATING *NAUSEA AND VOMITING THAT IS NOT CONTROLLED WITH YOUR NAUSEA MEDICATION *UNUSUAL SHORTNESS OF BREATH *UNUSUAL BRUISING OR BLEEDING *URINARY PROBLEMS (pain or burning when urinating, or frequent urination) *BOWEL PROBLEMS (unusual diarrhea, constipation, pain near the anus) TENDERNESS IN MOUTH AND THROAT WITH OR WITHOUT PRESENCE OF ULCERS (sore throat, sores in mouth, or a toothache) UNUSUAL RASH, SWELLING OR PAIN  UNUSUAL VAGINAL DISCHARGE OR ITCHING   Items with * indicate a potential emergency and should be followed up as soon as possible or go to the Emergency Department if any problems should occur.  Please show the CHEMOTHERAPY ALERT CARD or IMMUNOTHERAPY ALERT CARD at check-in to the  Emergency Department and triage nurse.  Should you have questions after your visit or need to cancel or reschedule your appointment, please contact [facility]  Dept: [phone]  and follow the prompts.  Office hours are 8:00 a.m. to 4:30 p.m. Monday - Friday. Please note that voicemails left after 4:00 p.m. may not be returned until the following business day.  We are closed weekends and major holidays. You have access to a nurse at all times for urgent questions. Please call the main number to the clinic Dept: [phone] and follow the prompts.   For any non-urgent questions, you may also contact your provider using MyChart. We now offer e-Visits for anyone 31 and older to request care online for non-urgent symptoms. For details visit [email or web address].   Also download the MyChart app! Go to the app store, search "MyChart", open the app, select [facility], and log in with your MyChart username and password.  Masks are optional in the cancer centers. If you would like for your care team to wear a mask while they are taking care of you, please let them know. For doctor visits, patients may have with them one support person who is at least 61 years old. At this time, visitors are not allowed in the infusion area.

## 2022-04-23 ENCOUNTER — Telehealth: Payer: Self-pay | Admitting: Internal Medicine

## 2022-04-23 NOTE — Telephone Encounter (Signed)
Per 6/19 los called and spoke to pt about appointment .  Pt confirmed appointment

## 2022-04-25 ENCOUNTER — Other Ambulatory Visit (HOSPITAL_COMMUNITY): Payer: Self-pay

## 2022-05-08 ENCOUNTER — Other Ambulatory Visit: Payer: Self-pay | Admitting: Internal Medicine

## 2022-05-09 ENCOUNTER — Encounter: Payer: Self-pay | Admitting: Internal Medicine

## 2022-05-09 ENCOUNTER — Other Ambulatory Visit: Payer: Self-pay | Admitting: Radiation Therapy

## 2022-05-11 ENCOUNTER — Ambulatory Visit (HOSPITAL_COMMUNITY)
Admission: RE | Admit: 2022-05-11 | Discharge: 2022-05-11 | Disposition: A | Discharge status: Home / Self Care | Payer: BC Managed Care – PPO | Source: Ambulatory Visit | Attending: Internal Medicine | Admitting: Internal Medicine

## 2022-05-11 DIAGNOSIS — C719 Malignant neoplasm of brain, unspecified: Secondary | ICD-10-CM | POA: Insufficient documentation

## 2022-05-11 MED ORDER — GADOBUTROL 1 MMOL/ML IV SOLN
6.0000 mL | Freq: Once | INTRAVENOUS | Status: AC | PRN
  Administered 2022-05-11: 6 mL via INTRAVENOUS

## 2022-05-13 ENCOUNTER — Inpatient Hospital Stay: Payer: BC Managed Care – PPO

## 2022-05-13 ENCOUNTER — Other Ambulatory Visit: Payer: Self-pay

## 2022-05-13 ENCOUNTER — Inpatient Hospital Stay (HOSPITAL_BASED_OUTPATIENT_CLINIC_OR_DEPARTMENT_OTHER): Payer: BC Managed Care – PPO | Admitting: Internal Medicine

## 2022-05-13 ENCOUNTER — Other Ambulatory Visit (HOSPITAL_COMMUNITY): Payer: Self-pay

## 2022-05-13 ENCOUNTER — Inpatient Hospital Stay: Payer: BC Managed Care – PPO | Attending: Internal Medicine

## 2022-05-13 VITALS — BP 134/89 | HR 16 | Temp 97.7°F | Resp 16 | Wt 143.3 lb

## 2022-05-13 VITALS — HR 79

## 2022-05-13 DIAGNOSIS — C719 Malignant neoplasm of brain, unspecified: Secondary | ICD-10-CM

## 2022-05-13 DIAGNOSIS — Z5112 Encounter for antineoplastic immunotherapy: Secondary | ICD-10-CM | POA: Insufficient documentation

## 2022-05-13 DIAGNOSIS — Z7901 Long term (current) use of anticoagulants: Secondary | ICD-10-CM | POA: Insufficient documentation

## 2022-05-13 DIAGNOSIS — Z9221 Personal history of antineoplastic chemotherapy: Secondary | ICD-10-CM | POA: Insufficient documentation

## 2022-05-13 DIAGNOSIS — C711 Malignant neoplasm of frontal lobe: Secondary | ICD-10-CM | POA: Insufficient documentation

## 2022-05-13 DIAGNOSIS — Z79899 Other long term (current) drug therapy: Secondary | ICD-10-CM | POA: Diagnosis not present

## 2022-05-13 DIAGNOSIS — Z87891 Personal history of nicotine dependence: Secondary | ICD-10-CM | POA: Diagnosis not present

## 2022-05-13 DIAGNOSIS — Z923 Personal history of irradiation: Secondary | ICD-10-CM | POA: Diagnosis not present

## 2022-05-13 LAB — CBC WITH DIFFERENTIAL (CANCER CENTER ONLY)
Abs Immature Granulocytes: 0 10*3/uL (ref 0.00–0.07)
Basophils Absolute: 0 10*3/uL (ref 0.0–0.1)
Basophils Relative: 1 %
Eosinophils Absolute: 0.1 10*3/uL (ref 0.0–0.5)
Eosinophils Relative: 2 %
HCT: 44.6 % (ref 36.0–46.0)
Hemoglobin: 15.5 g/dL — ABNORMAL HIGH (ref 12.0–15.0)
Immature Granulocytes: 0 %
Lymphocytes Relative: 22 %
Lymphs Abs: 0.5 10*3/uL — ABNORMAL LOW (ref 0.7–4.0)
MCH: 35.5 pg — ABNORMAL HIGH (ref 26.0–34.0)
MCHC: 34.8 g/dL (ref 30.0–36.0)
MCV: 102.1 fL — ABNORMAL HIGH (ref 80.0–100.0)
Monocytes Absolute: 0.3 10*3/uL (ref 0.1–1.0)
Monocytes Relative: 15 %
Neutro Abs: 1.3 10*3/uL — ABNORMAL LOW (ref 1.7–7.7)
Neutrophils Relative %: 60 %
Platelet Count: 110 10*3/uL — ABNORMAL LOW (ref 150–400)
RBC: 4.37 MIL/uL (ref 3.87–5.11)
RDW: 13.5 % (ref 11.5–15.5)
WBC Count: 2.1 10*3/uL — ABNORMAL LOW (ref 4.0–10.5)
nRBC: 0 % (ref 0.0–0.2)

## 2022-05-13 LAB — TOTAL PROTEIN, URINE DIPSTICK: Protein, ur: 30 mg/dL — AB

## 2022-05-13 LAB — CMP (CANCER CENTER ONLY)
ALT: 25 U/L (ref 0–44)
AST: 30 U/L (ref 15–41)
Albumin: 4.1 g/dL (ref 3.5–5.0)
Alkaline Phosphatase: 71 U/L (ref 38–126)
Anion gap: 9 (ref 5–15)
BUN: 15 mg/dL (ref 8–23)
CO2: 25 mmol/L (ref 22–32)
Calcium: 9.9 mg/dL (ref 8.9–10.3)
Chloride: 107 mmol/L (ref 98–111)
Creatinine: 0.67 mg/dL (ref 0.44–1.00)
GFR, Estimated: >60 mL/min (ref >60)
Glucose, Bld: 95 mg/dL (ref 70–99)
Potassium: 3.9 mmol/L (ref 3.5–5.1)
Sodium: 141 mmol/L (ref 135–145)
Total Bilirubin: 0.5 mg/dL (ref 0.3–1.2)
Total Protein: 7.2 g/dL (ref 6.5–8.1)

## 2022-05-13 MED ORDER — LOMUSTINE 10 MG PO CAPS
10.0000 mg | ORAL_CAPSULE | Freq: Once | ORAL | 0 refills | Status: AC
  Filled 2022-05-13 – 2022-06-07 (×2): qty 1, 1d supply, fill #0

## 2022-05-13 MED ORDER — SODIUM CHLORIDE 0.9 % IV SOLN
600.0000 mg | Freq: Once | INTRAVENOUS | Status: AC
  Administered 2022-05-13: 600 mg via INTRAVENOUS
  Filled 2022-05-13: qty 16

## 2022-05-13 MED ORDER — LOMUSTINE 100 MG PO CAPS
100.0000 mg | ORAL_CAPSULE | Freq: Once | ORAL | 0 refills | Status: AC
  Filled 2022-05-13 – 2022-06-07 (×2): qty 1, 1d supply, fill #0

## 2022-05-13 MED ORDER — LOMUSTINE 40 MG PO CAPS
40.0000 mg | ORAL_CAPSULE | Freq: Once | ORAL | 0 refills | Status: AC
  Filled 2022-05-13 – 2022-06-07 (×2): qty 1, 1d supply, fill #0

## 2022-05-13 MED ORDER — SODIUM CHLORIDE 0.9 % IV SOLN: Freq: Once | INTRAVENOUS | Status: AC

## 2022-05-13 NOTE — Progress Notes (Signed)
[facility] [place], [place] [address] [phone]   Interval Evaluation  Date of Service: 05/13/22 Patient Name: Hannah [name] Patient MRN: [id number] Patient DOB: [birth year] Provider: [name], MD  Identifying Statement:  Hannah [name] is a 60 y.o. female with left frontal glioblastoma   Oncologic History: Oncology History  GBM (glioblastoma multiforme) ([facility])   Initial Diagnosis   GBM (glioblastoma multiforme) ([facility])   02/17/2020 Surgery   Craniotomy, left frontal resection by Dr. Marcello [name].  Path is glioblastoma.   08/28/2020 - 10/09/2020 Radiation Therapy   Radiation and concurrent Temozolomide 20m/m2 daily   11/10/2020 - 12/09/2020 Chemotherapy   Completes 1 cycle of 5-day Temozolomide      [facility]   Admitted for saddle PE, undergoes thrombectomy and IVC filter placement   03/09/2021 Progression     03/27/2021 -  Chemotherapy   Initiates second line therapy with Avastin 135mkg IV q2 weeks    08/08/2021 - 08/09/2021 Chemotherapy   Patient is on Treatment Plan : BRAIN GLIOBLASTOMA Consolidation Temozolomide Days 1-5 q28 Days      12/03/2021 -  Chemotherapy   Patient is on Treatment Plan : BRAIN [place]ine q42d       Biomarkers:  MGMT Methylated.  IDH 1/2 Wild type.  EGFR Unknown  TERT Unknown   Interval History: Hannah [name] today for avastin infusion.  She has now completed cycle #3 CCNU.  Continues on vimpat 100/50 without seizures.  No significant changes with ongoing fatigue, lethargy, continues reliance on family for functional support.  No other new or progressive complaints today.    Decadron 11/09/20: 73m61m2/10/22: 8mg21m/21/22: 6mg 74m07/22: 4mg 0104m9/22: 8mg 04673m/22: 6mg 05/4m22: 3mg 05/246m2: 73mg 06/06873m: 1.5mg 06/20/95m -  H+P (01/20/20) Patient presented one month ago with sudden onset left arm and leg weakness and interrupted speech, c/w  seizure.  She was playing tennis at the time, [name], stroke eval and CNS imaging which demonstrated a non-enhancing left frontal mass.  At this time she is back to baseline without recurrence of events, taking Keppra 500mg twice 473mday.  No history or seizure or any other neurologic events.  She does describe being sleep deprived prior to seizure event.  [name] today after one month follow up MRI scan.  Medications: Current Outpatient Medications on File Prior to Visit  Medication Sig Dispense Refill   amLODipine (NORVASC) 5 MG tablet TAKE 1 TABLET (5 MG TOTAL) BY MOUTH DAILY. 30 tablet 3   apixaban (ELIQUIS) 5 MG TABS tablet TAKE 1 TABLET (5 MG TOTAL) BY MOUTH 2 (TWO) TIMES DAILY. 180 tablet 1   Baclofen 5 MG TABS Take 1 tablet (5 mg) by mouth 3 (three) times daily. 90 tablet 3   cholecalciferol (VITAMIN D3) 25 MCG (1000 UNIT) tablet Take 1,000 Units by mouth daily.     docusate sodium (COLACE) 100 MG capsule Take 100 mg by mouth 2 (two) times daily as needed for mild constipation.     Lacosamide 100 MG TABS Take 1 tablet by mouth in the morning (100mg) and 2 33mets by mouth at bedtime (200mg) 90 tabl973m   [DISCONTINUED] lamoTRIgine (LAMICTAL) 100 MG tablet Take 1 tablet (100 mg total) by mouth daily. 30 tablet 3   No current facility-administered medications on file prior to visit.    Allergies:  No Known Allergies Past Medical History:  Past Medical History:  Diagnosis Date   Cervical spondylolysis 12/15/2019   Skeleton: Mild cervical spondylosis C6-7. Incidental find.    Colon polyp    GBM (glioblastoma multiforme) ([facility])    Left ankle sprain 10/07/2011   Seizure ([facility])    partial    Tick bite 02/2020   Past Surgical History:  Past Surgical History:  Procedure Laterality Date   APPLICATION OF CRANIAL NAVIGATION Left 02/16/2020   Procedure: APPLICATION OF CRANIAL NAVIGATION;  Surgeon: Hannah Mare, MD;  Location: [facility];  Service: Neurosurgery;  Laterality: Left;   CRANIOTOMY Left 02/16/2020   Procedure: LEFT FRONTAL CRANIOTOMY FOR BRAIN TUMOR;  Surgeon: Hannah Mare, MD;  Location: [facility];  Service: Neurosurgery;  Laterality: Left;   FOOT SURGERY Left    bone spur - local anesthesia   IR ANGIOGRAM PULMONARY BILATERAL SELECTIVE  12/15/2020   IR ANGIOGRAM SELECTIVE EACH ADDITIONAL VESSEL  12/15/2020   IR ANGIOGRAM SELECTIVE EACH ADDITIONAL VESSEL  12/15/2020   IR IVC FILTER PLMT / S&I /IMG GUID/MOD SED  12/15/2020   IR THROMBECT PRIM MECH INIT (INCLU) MOD SED  12/15/2020   IR THROMBECT PRIM MECH INIT (INCLU) MOD SED  12/15/2020   RADIOLOGY WITH ANESTHESIA N/A 12/15/2020   Procedure: IR WITH ANESTHESIA;  Surgeon: Radiologist, Medication, MD;  Location: [facility];  Service: Radiology;  Laterality: N/A;   TONSILECTOMY/ADENOIDECTOMY WITH MYRINGOTOMY     tonsils and adenoids out only   WISDOM TOOTH EXTRACTION     Social History:  Social History   Socioeconomic History   Marital status: Married    Spouse name: Not on file   Number of children: 3   Years of education: Not on file   Highest education level: Not on file  Occupational History   Not on file  Tobacco Use   Smoking status: Former    Years: 2.00    Types: Cigarettes    Quit date: 11/05/1979    Years since quitting: 42.5   Smokeless tobacco: Never   Tobacco comments:    1/2 pack a week  Vaping Use   Vaping Use: Never used  Substance and Sexual Activity   Alcohol use: Yes    Alcohol/week: 0.0 standard drinks of alcohol    Comment: 1 glass of wine weekly   Drug use: No   Sexual activity: Yes    Birth control/protection: Surgical  Other Topics Concern   Not on file  Social History Narrative   Not on file   Social Determinants of Health   Financial Resource Strain: Not on file  Food Insecurity: Not on file  Transportation Needs: Not on file  Physical Activity: Not on file  Stress: Not on file  Social  Connections: Not on file  Intimate Partner Violence: Not on file   Family History:  Family History  Problem Relation Age of Onset   Hypertension Mother    Stroke Mother    Diabetes Mother    Colon cancer Father 61   Hypertension Sister    Colon polyps Sister    Heart disease Brother 62   Diabetes Maternal Uncle    Diabetes Maternal Uncle     Review of Systems: Constitutional: Doesn't report fevers, chills or abnormal weight loss Eyes: Doesn't report blurriness of vision Ears, nose, mouth, throat, and face: Doesn't report sore throat Respiratory: Chest wall pain Cardiovascular: Doesn't report palpitation, chest discomfort  Gastrointestinal:  Doesn't report nausea, constipation, diarrhea GU: Doesn't  report incontinence Skin: Doesn't report skin rashes Neurological: Per HPI Musculoskeletal: normal Behavioral/Psych: Doesn't report anxiety  Physical Exam: Vitals:   05/13/22 1216  BP: 134/89  Pulse: (!) 16  Resp: 16  Temp: 97.7 F (36.5 C)  SpO2: 97%    KPS: 60. General: Alert, cooperative, pleasant, in no acute distress Head: Normal EENT: No conjunctival injection or scleral icterus.  Lungs: Resp effort normal Cardiac: Regular rate Abdomen: Non-distended abdomen Skin: No rashes cyanosis or petechiae. Extremities: No clubbing or edema  Neurologic Exam: Mental Status: Awake, alert, attentive to examiner. Both receptive and expressive dysphasia.  Cranial Nerves: Visual acuity is grossly normal. Visual fields are full. Extra-ocular movements intact. No ptosis. Face is symmetric Motor: Tone and bulk are normal. R arm 1/5. Right leg 3/5, left leg 4/5, proximal greater than distal weakness.  Noted R leg flexor spasticity. Reflexes are symmetric, no pathologic reflexes present.  Sensory: Intact to light touch Gait: Deferred today  Labs: I have reviewed the data as listed    Component Value Date/Time   NA 141 04/22/2022 1222   K 3.8 04/22/2022 1222   CL 106  04/22/2022 1222   CO2 24 04/22/2022 1222   GLUCOSE 99 04/22/2022 1222   GLUCOSE 85 10/13/2006 1158   BUN 11 04/22/2022 1222   CREATININE 0.65 04/22/2022 1222   CALCIUM 10.0 04/22/2022 1222   PROT 7.5 04/22/2022 1222   ALBUMIN 4.4 04/22/2022 1222   AST 26 04/22/2022 1222   ALT 22 04/22/2022 1222   ALKPHOS 71 04/22/2022 1222   BILITOT 0.5 04/22/2022 1222   GFRNONAA >60 04/22/2022 1222   GFRAA >60 02/17/2020 0136   Lab Results  Component Value Date   WBC 2.1 (L) 05/13/2022   NEUTROABS 1.3 (L) 05/13/2022   HGB 15.5 (H) 05/13/2022   HCT 44.6 05/13/2022   MCV 102.1 (H) 05/13/2022   PLT 110 (L) 05/13/2022   Imaging:  [facility] Clinician Interpretation: I have personally reviewed the CNS images as listed.  My interpretation, in the context of the patient's clinical presentation, is stable disease  MR BRAIN W WO CONTRAST  Result Date: 05/12/2022 CLINICAL DATA:  Provided history: Glioblastoma multiforme. Brain/CNS neoplasm, assess treatment response. EXAM: MRI HEAD WITHOUT AND WITH CONTRAST TECHNIQUE: Multiplanar, multiecho pulse sequences of the brain and surrounding structures were obtained without and with intravenous contrast. CONTRAST:  88m GADAVIST GADOBUTROL 1 MMOL/ML IV SOLN COMPARISON:  Prior brain MRI examinations 01/26/2022 and earlier. FINDINGS: Brain: Infiltrative mass centered within the corpus callosum with mineralization and chronic blood products. Stable postoperative changes from prior left frontal approach biopsy. Somewhat nodular foci of restricted diffusion along the posterior bodies of both lateral ventricles, right greater than left, progressed from the prior MRI of 01/26/2022 (for instance as seen on series 13, image 47). No definite corresponding enhancement is identified at these sites, however, the presence of pre-contrast T1 hyperintense blood products/mineralization limits evaluation. The extent of restricted diffusion along portions of the left lateral ventricle anterior  body, left lateral ventricle atrium and right lateral ventricle frontal horn have slightly decreased in extent. The post-contrast T1-weighted imaging is intermittently motion degraded. Within this limitation, the extent of masslike parenchymal swelling, restricted diffusion and irregular enhancement centered about the corpus callosum and lateral ventricles has otherwise not significantly changed. Extensive surrounding T2 FLAIR hyperintense signal abnormality within bilateral cerebral hemispheres has remained stable. No extra-axial fluid collection or midline shift. Vascular: Maintained flow voids within the proximal large arterial vessels. Skull and upper cervical spine: Left  parietal craniotomy. No focal suspicious marrow lesion. Sinuses/Orbits: No mass or acute finding within the imaged orbits. Trace mucosal thickening within the bilateral ethmoid sinuses. Small fluid level within the left sphenoid sinus. IMPRESSION: Foci of restricted diffusion along the margins of both lateral ventricles have evolved in appearance since the prior brain MRI of 01/26/2022. The extent of restricted diffusion along portions of the left lateral ventricle anterior body, the left lateral ventricle atrium and the right lateral ventricle frontal horn have slightly decreased in extent. However, somewhat nodular foci of restricted diffusion along the posterior bodies of both lateral ventricles have mildy increased. Close MR imaging follow-up is recommended to exclude early tumor progression about the posterior lateral ventricles. Within the limitations of intermittent motion degradation, the infiltrative mass centered within the corpus callosum has otherwise not appreciably changed. Electronically Signed   By: Kellie Simmering D.O.   On: 05/12/2022 16:51    Assessment/Plan No diagnosis found.  Hannah [name] is clinically stable today, now having completed cycle #3 CCNU/avastin.  MRI brain demonstrates mainly stable findings, some mild  increase in DWI signal abnormality adjacent to left lateral ventricle.  We will defer cycle #4 CCNU 17m/m2 q6 week for an additional 3 weeks given cytopenias today, ANC and platelets.   Chemotherapy should be held for the following:  ANC less than 1,000  Platelets less than 100,000  LFT or creatinine greater than 2x ULN  If clinical concerns/contraindications develop  Avastin should be held for the following:  ANC less than 500  Platelets less than 50,000  LFT or creatinine greater than 2x ULN  If clinical concerns/contraindications develop  Will con't Norvasc 578mdaily.  Vimpat will con't 10049m0mg, Eliquis as prior.   Baclofen con't 5mg36m pRN.  We ask that Hannah [name] return to clinic in 3 weeks for next avastin infusion.  Will plan to resume CCNU at that time if blood counts are improved.  All questions were answered. The patient knows to call the clinic with any problems, questions or concerns. No barriers to learning were detected.  I have spent a total of 30 minutes of face-to-face and non-face-to-face time, excluding clinical staff time, preparing to see patient, ordering tests and/or medications, counseling the patient, and independently interpreting results and communicating results to the patient/family/caregiver    ZachVentura [name] Medical Director of Neuro-Oncology [facility]10/23 12:13 PM

## 2022-05-13 NOTE — Patient Instructions (Signed)
[facility]ICAL [facility]COLOG[facility]  Discharge Instructions: Thank you for choosing [facility] to provide your oncology and hematology care.   If you have a lab appointment with the Cancer Center, please go directly to the Cancer Center and check in at the registration area.   Wear comfortable clothing and clothing appropriate for easy access to any Portacath or PICC line.   We strive to give you quality time with your provider. You may need to reschedule your appointment if you arrive late (15 or more minutes).  Arriving late affects you and other patients whose appointments are after yours.  Also, if you miss three or more appointments without notifying the office, you may be dismissed from the clinic at the provider's discretion.      For prescription refill requests, have your pharmacy contact our office and allow 72 hours for refills to be completed.    Today you received the following chemotherapy and/or immunotherapy agents bevacizumab.      To help prevent nausea and vomiting after your treatment, we encourage you to take your nausea medication as directed.  BELOW ARE SYMPTOMS THAT SHOULD BE REPORTED IMMEDIATELY: *FEVER GREATER THAN 100.4 F (38 C) OR HIGHER *CHILLS OR SWEATING *NAUSEA AND VOMITING THAT IS NOT CONTROLLED WITH YOUR NAUSEA MEDICATION *UNUSUAL SHORTNESS OF BREATH *UNUSUAL BRUISING OR BLEEDING *URINARY PROBLEMS (pain or burning when urinating, or frequent urination) *BOWEL PROBLEMS (unusual diarrhea, constipation, pain near the anus) TENDERNESS IN MOUTH AND THROAT WITH OR WITHOUT PRESENCE OF ULCERS (sore throat, sores in mouth, or a toothache) UNUSUAL RASH, SWELLING OR PAIN  UNUSUAL VAGINAL DISCHARGE OR ITCHING   Items with * indicate a potential emergency and should be followed up as soon as possible or go to the Emergency Department if any problems should occur.  Please show the CHEMOTHERAPY ALERT CARD or IMMUNOTHERAPY ALERT CARD at check-in  to the Emergency Department and triage nurse.  Should you have questions after your visit or need to cancel or reschedule your appointment, please contact [facility]  Dept: [phone]  and follow the prompts.  Office hours are 8:00 a.m. to 4:30 p.m. Monday - Friday. Please note that voicemails left after 4:00 p.m. may not be returned until the following business day.  We are closed weekends and major holidays. You have access to a nurse at all times for urgent questions. Please call the main number to the clinic Dept: [phone] and follow the prompts.   For any non-urgent questions, you may also contact your provider using MyChart. We now offer e-Visits for anyone 18 and older to request care online for non-urgent symptoms. For details visit [email or web address].   Also download the MyChart app! Go to the app store, search "MyChart", open the app, select [facility], and log in with your MyChart username and password.  Masks are optional in the cancer centers. If you would like for your care team to wear a mask while they are taking care of you, please let them know. For doctor visits, patients may have with them one support person who is at least 61 years old. At this time, visitors are not allowed in the infusion area. 

## 2022-05-14 ENCOUNTER — Telehealth: Payer: Self-pay | Admitting: Internal Medicine

## 2022-05-14 NOTE — Telephone Encounter (Signed)
Per 7/10 los called and left message for pt about appointment.  Details and call back number were left.

## 2022-05-22 ENCOUNTER — Other Ambulatory Visit (HOSPITAL_COMMUNITY): Payer: Self-pay

## 2022-05-24 ENCOUNTER — Other Ambulatory Visit (HOSPITAL_COMMUNITY): Payer: Self-pay

## 2022-05-27 ENCOUNTER — Other Ambulatory Visit: Payer: Self-pay

## 2022-05-28 ENCOUNTER — Other Ambulatory Visit (HOSPITAL_COMMUNITY): Payer: Self-pay

## 2022-05-29 ENCOUNTER — Other Ambulatory Visit (HOSPITAL_COMMUNITY): Payer: Self-pay

## 2022-06-03 ENCOUNTER — Other Ambulatory Visit (HOSPITAL_COMMUNITY): Payer: Self-pay

## 2022-06-03 ENCOUNTER — Other Ambulatory Visit: Payer: BC Managed Care – PPO

## 2022-06-03 ENCOUNTER — Ambulatory Visit: Payer: BC Managed Care – PPO

## 2022-06-03 ENCOUNTER — Ambulatory Visit: Payer: BC Managed Care – PPO | Admitting: Internal Medicine

## 2022-06-04 ENCOUNTER — Other Ambulatory Visit: Payer: Self-pay

## 2022-06-04 ENCOUNTER — Inpatient Hospital Stay: Payer: BC Managed Care – PPO | Admitting: Internal Medicine

## 2022-06-04 ENCOUNTER — Inpatient Hospital Stay: Payer: BC Managed Care – PPO

## 2022-06-04 ENCOUNTER — Inpatient Hospital Stay: Payer: BC Managed Care – PPO | Attending: Internal Medicine

## 2022-06-04 VITALS — BP 134/86 | HR 86 | Temp 97.8°F | Resp 16 | Ht 65.0 in | Wt 135.1 lb

## 2022-06-04 DIAGNOSIS — Z923 Personal history of irradiation: Secondary | ICD-10-CM | POA: Insufficient documentation

## 2022-06-04 DIAGNOSIS — Z5112 Encounter for antineoplastic immunotherapy: Secondary | ICD-10-CM | POA: Insufficient documentation

## 2022-06-04 DIAGNOSIS — C711 Malignant neoplasm of frontal lobe: Secondary | ICD-10-CM | POA: Diagnosis present

## 2022-06-04 DIAGNOSIS — C719 Malignant neoplasm of brain, unspecified: Secondary | ICD-10-CM

## 2022-06-04 DIAGNOSIS — Z9221 Personal history of antineoplastic chemotherapy: Secondary | ICD-10-CM | POA: Insufficient documentation

## 2022-06-04 DIAGNOSIS — Z87891 Personal history of nicotine dependence: Secondary | ICD-10-CM | POA: Diagnosis not present

## 2022-06-04 LAB — CBC WITH DIFFERENTIAL (CANCER CENTER ONLY)
Abs Immature Granulocytes: 0 10*3/uL (ref 0.00–0.07)
Basophils Absolute: 0 10*3/uL (ref 0.0–0.1)
Basophils Relative: 0 %
Eosinophils Absolute: 0 10*3/uL (ref 0.0–0.5)
Eosinophils Relative: 1 %
HCT: 48.2 % — ABNORMAL HIGH (ref 36.0–46.0)
Hemoglobin: 16.6 g/dL — ABNORMAL HIGH (ref 12.0–15.0)
Immature Granulocytes: 0 %
Lymphocytes Relative: 22 %
Lymphs Abs: 0.5 10*3/uL — ABNORMAL LOW (ref 0.7–4.0)
MCH: 35.4 pg — ABNORMAL HIGH (ref 26.0–34.0)
MCHC: 34.4 g/dL (ref 30.0–36.0)
MCV: 102.8 fL — ABNORMAL HIGH (ref 80.0–100.0)
Monocytes Absolute: 0.3 10*3/uL (ref 0.1–1.0)
Monocytes Relative: 13 %
Neutro Abs: 1.5 10*3/uL — ABNORMAL LOW (ref 1.7–7.7)
Neutrophils Relative %: 64 %
Platelet Count: 125 10*3/uL — ABNORMAL LOW (ref 150–400)
RBC: 4.69 MIL/uL (ref 3.87–5.11)
RDW: 13.2 % (ref 11.5–15.5)
WBC Count: 2.3 10*3/uL — ABNORMAL LOW (ref 4.0–10.5)
nRBC: 0 % (ref 0.0–0.2)

## 2022-06-04 LAB — CMP (CANCER CENTER ONLY)
ALT: 30 U/L (ref 0–44)
AST: 30 U/L (ref 15–41)
Albumin: 4.2 g/dL (ref 3.5–5.0)
Alkaline Phosphatase: 71 U/L (ref 38–126)
Anion gap: 8 (ref 5–15)
BUN: 15 mg/dL (ref 8–23)
CO2: 25 mmol/L (ref 22–32)
Calcium: 9.7 mg/dL (ref 8.9–10.3)
Chloride: 108 mmol/L (ref 98–111)
Creatinine: 0.69 mg/dL (ref 0.44–1.00)
GFR, Estimated: >60 mL/min (ref >60)
Glucose, Bld: 106 mg/dL — ABNORMAL HIGH (ref 70–99)
Potassium: 3.8 mmol/L (ref 3.5–5.1)
Sodium: 141 mmol/L (ref 135–145)
Total Bilirubin: 0.5 mg/dL (ref 0.3–1.2)
Total Protein: 7.2 g/dL (ref 6.5–8.1)

## 2022-06-04 LAB — TOTAL PROTEIN, URINE DIPSTICK: Protein, ur: <30 mg/dL — AB

## 2022-06-04 MED ORDER — SODIUM CHLORIDE 0.9 % IV SOLN: Freq: Once | INTRAVENOUS | Status: AC

## 2022-06-04 MED ORDER — SODIUM CHLORIDE 0.9 % IV SOLN
600.0000 mg | Freq: Once | INTRAVENOUS | Status: AC
  Administered 2022-06-04: 600 mg via INTRAVENOUS
  Filled 2022-06-04: qty 16

## 2022-06-04 NOTE — Progress Notes (Signed)
Ok to treat today without urine per Dr Mickeal Skinner

## 2022-06-04 NOTE — Patient Instructions (Signed)
[facility]COL[facility]   Discharge Instructions: Thank you for choosing [facility] to provide your oncology and hematology care.   If you have a lab appointment with the [facility], please go directly to the [facility] and check in at the registration area.   Wear comfortable clothing and clothing appropriate for easy access to any Portacath or PICC line.   We strive to give you quality time with your provider. You may need to reschedule your appointment if you arrive late (15 or more minutes).  Arriving late affects you and other patients whose appointments are after yours.  Also, if you miss three or more appointments without notifying the office, you may be dismissed from the clinic at the provider's discretion.      For prescription refill requests, have your pharmacy contact our office and allow 72 hours for refills to be completed.    Today you received the following chemotherapy and/or immunotherapy agents: [id number]-awwb      To help prevent nausea and vomiting after your treatment, we encourage you to take your nausea medication as directed.  BELOW ARE SYMPTOMS THAT SHOULD BE REPORTED IMMEDIATELY: *FEVER GREATER THAN 100.4 F (38 C) OR HIGHER *CHILLS OR SWEATING *NAUSEA AND VOMITING THAT IS NOT CONTROLLED WITH YOUR NAUSEA MEDICATION *UNUSUAL SHORTNESS OF BREATH *UNUSUAL BRUISING OR BLEEDING *URINARY PROBLEMS (pain or burning when urinating, or frequent urination) *BOWEL PROBLEMS (unusual diarrhea, constipation, pain near the anus) TENDERNESS IN MOUTH AND THROAT WITH OR WITHOUT PRESENCE OF ULCERS (sore throat, sores in mouth, or a toothache) UNUSUAL RASH, SWELLING OR PAIN  UNUSUAL VAGINAL DISCHARGE OR ITCHING   Items with * indicate a potential emergency and should be followed up as soon as possible or go to the Emergency Department if any problems should occur.  Please show the CHEMOTHERAPY ALERT CARD or IMMUNOTHERAPY ALERT CARD at  check-in to the Emergency Department and triage nurse.  Should you have questions after your visit or need to cancel or reschedule your appointment, please contact [facility]  Dept: [phone]  and follow the prompts.  Office hours are 8:00 a.m. to 4:30 p.m. Monday - Friday. Please note that voicemails left after 4:00 p.m. may not be returned until the following business day.  We are closed weekends and major holidays. You have access to a nurse at all times for urgent questions. Please call the main number to the clinic Dept: [phone] and follow the prompts.   For any non-urgent questions, you may also contact your provider using MyChart. We now offer e-Visits for anyone 90 and older to request care online for non-urgent symptoms. For details visit [email or web address].   Also download the MyChart app! Go to the app store, search "MyChart", open the app, select [facility], and log in with your MyChart username and password.  Masks are optional in the cancer centers. If you would like for your care team to wear a mask while they are taking care of you, please let them know. You may have one support person who is at least 61 years old accompany you for your appointments.

## 2022-06-04 NOTE — Progress Notes (Signed)
[facility] [place], [place] [address] [phone]   Interval Evaluation  Date of Service: 06/04/22 Patient Name: Hannah [name] Patient MRN: [id number] Patient DOB: April 11, 1961 Provider: [name], MD  Identifying Statement:  Hannah [name] is a 61 y.o. female with left frontal glioblastoma   Oncologic History: Oncology History  GBM (glioblastoma multiforme) ([facility] [facility])   Initial Diagnosis   GBM (glioblastoma multiforme) ([facility])   02/17/2020 Surgery   Craniotomy, left frontal resection by Dr. Marcello Moores.  Path is glioblastoma.   08/28/2020 - 10/09/2020 Radiation Therapy   Radiation and concurrent Temozolomide 75m/m2 daily   11/10/2020 - 12/09/2020 Chemotherapy   Completes 1 cycle of 5-day Temozolomide      [facility]   Admitted for saddle PE, undergoes thrombectomy and IVC filter placement   03/09/2021 Progression     03/27/2021 -  Chemotherapy   Initiates second line therapy with Avastin 167mkg IV q2 weeks    08/08/2021 - 08/09/2021 Chemotherapy   Patient is on Treatment Plan : BRAIN GLIOBLASTOMA Consolidation Temozolomide Days 1-5 q28 Days      12/03/2021 -  Chemotherapy   Patient is on Treatment Plan : BRAIN Lomustine q42d       Biomarkers:  MGMT Methylated.  IDH 1/2 Wild type.  EGFR Unknown  TERT Unknown   Interval History: [name] presents today for avastin infusion.  She has completed cycle #3 CCNU.  Vimpat has been decreased to 5090mID without breakthrough seizures.  No significant changes with ongoing fatigue, lethargy, continues reliance on family for functional support.  No other new or progressive complaints today.    Decadron 11/09/20: 2mg46m/10/22: 8mg 81m21/22: 6mg 081m7/22: 4mg 0346m/22: 8mg 04/72m22: 6mg 05/153m2: 3mg 05/2356m: 2mg 06/06/79m 1.5mg 06/20/213m-  H+P (01/20/20) Patient presented one month ago with sudden onset left arm and leg weakness and  interrupted speech, [name], [name], stroke eval and CNS imaging which demonstrated a non-enhancing left frontal mass.  At this time she is back to baseline without recurrence of events, taking Keppra 500mg twice p51may.  No history or seizure or any other neurologic events.  She does describe being sleep deprived prior to seizure event.  She presents today after one month follow up MRI scan.  Medications: Current Outpatient Medications on File Prior to Visit  Medication Sig Dispense Refill   amLODipine (NORVASC) 5 MG tablet TAKE 1 TABLET (5 MG TOTAL) BY MOUTH DAILY. 30 tablet 3   apixaban (ELIQUIS) 5 MG TABS tablet TAKE 1 TABLET (5 MG TOTAL) BY MOUTH 2 (TWO) TIMES DAILY. 180 tablet 1   Baclofen 5 MG TABS Take 1 tablet (5 mg) by mouth 3 (three) times daily. 90 tablet 3   cholecalciferol (VITAMIN D3) 25 MCG (1000 UNIT) tablet Take 1,000 Units by mouth daily.     docusate sodium (COLACE) 100 MG capsule Take 100 mg by mouth 2 (two) times daily as needed for mild constipation.     Lacosamide 100 MG TABS Take 1 tablet by mouth in the morning (100mg) and 2 t63mts by mouth at bedtime (200mg) 90 table47m  [DISCONTINUED] lamoTRIgine (LAMICTAL) 100 MG tablet Take 1 tablet (100 mg total) by mouth daily. 30 tablet 3   No current facility-administered medications on file prior to visit.  Allergies: No Known Allergies Past Medical History:  Past Medical History:  Diagnosis Date   Cervical spondylolysis 12/15/2019   Skeleton: Mild cervical spondylosis C6-7. Incidental find.    Colon polyp    GBM (glioblastoma multiforme) ([facility])    Left ankle sprain 10/07/2011   Seizure ([facility])    partial    Tick bite 02/2020   Past Surgical History:  Past Surgical History:  Procedure Laterality Date   APPLICATION OF CRANIAL NAVIGATION Left 02/16/2020   Procedure: APPLICATION OF CRANIAL  NAVIGATION;  Surgeon: Vallarie Mare, MD;  Location: [facility];  Service: Neurosurgery;  Laterality: Left;   CRANIOTOMY Left 02/16/2020   Procedure: LEFT FRONTAL CRANIOTOMY FOR BRAIN TUMOR;  Surgeon: Vallarie Mare, MD;  Location: [facility];  Service: Neurosurgery;  Laterality: Left;   FOOT SURGERY Left    bone spur - local anesthesia   IR ANGIOGRAM PULMONARY BILATERAL SELECTIVE  12/15/2020   IR ANGIOGRAM SELECTIVE EACH ADDITIONAL VESSEL  12/15/2020   IR ANGIOGRAM SELECTIVE EACH ADDITIONAL VESSEL  12/15/2020   IR IVC FILTER PLMT / S&I /IMG GUID/MOD SED  12/15/2020   IR THROMBECT PRIM MECH INIT (INCLU) MOD SED  12/15/2020   IR THROMBECT PRIM MECH INIT (INCLU) MOD SED  12/15/2020   RADIOLOGY WITH ANESTHESIA N/A 12/15/2020   Procedure: IR WITH ANESTHESIA;  Surgeon: Radiologist, Medication, MD;  Location: [facility];  Service: Radiology;  Laterality: N/A;   TONSILECTOMY/ADENOIDECTOMY WITH MYRINGOTOMY     tonsils and adenoids out only   WISDOM TOOTH EXTRACTION     Social History:  Social History   Socioeconomic History   Marital status: Married    Spouse name: Not on file   Number of children: 3   Years of education: Not on file   Highest education level: Not on file  Occupational History   Not on file  Tobacco Use   Smoking status: Former    Years: 2.00    Types: Cigarettes    Quit date: 11/05/1979    Years since quitting: 42.6   Smokeless tobacco: Never   Tobacco comments:    1/2 pack a week  Vaping Use   Vaping Use: Never used  Substance and Sexual Activity   Alcohol use: Yes    Alcohol/week: 0.0 standard drinks of alcohol    Comment: 1 glass of wine weekly   Drug use: No   Sexual activity: Yes    Birth control/protection: Surgical  Other Topics Concern   Not on file  Social History Narrative   Not on file   Social Determinants of Health   Financial Resource Strain: Not on file  Food Insecurity: Not on file  Transportation Needs: Not on file  Physical Activity: Not on file   Stress: Not on file  Social Connections: Not on file  Intimate Partner Violence: Not on file   Family History:  Family History  Problem Relation Age of Onset   Hypertension Mother    Stroke Mother    Diabetes Mother    Colon cancer Father 88   Hypertension Sister    Colon polyps Sister    Heart disease Brother 70   Diabetes Maternal Uncle    Diabetes Maternal Uncle     Review of Systems: Constitutional: Doesn't report fevers, chills or abnormal weight loss Eyes: Doesn't report blurriness of vision Ears, nose, mouth, throat, and face: Doesn't report sore throat Respiratory: Chest wall pain Cardiovascular: Doesn't report palpitation, chest discomfort  Gastrointestinal:  Doesn't report nausea, constipation, diarrhea GU:  Doesn't report incontinence Skin: Doesn't report skin rashes Neurological: Per HPI Musculoskeletal: normal Behavioral/Psych: Doesn't report anxiety  Physical Exam: Vitals:   06/04/22 1032  BP: 134/86  Pulse: 86  Resp: 16  Temp: 97.8 F (36.6 C)  SpO2: 98%   KPS: 60. General: Alert, cooperative, pleasant, in no acute distress Head: Normal EENT: No conjunctival injection or scleral icterus.  Lungs: Resp effort normal Cardiac: Regular rate Abdomen: Non-distended abdomen Skin: No rashes cyanosis or petechiae. Extremities: No clubbing or edema  Neurologic Exam: Mental Status: Awake, alert, attentive to examiner. Both receptive and expressive dysphasia.  Cranial Nerves: Visual acuity is grossly normal. Visual fields are full. Extra-ocular movements intact. No ptosis. Face is symmetric Motor: Tone and bulk are normal. R arm 1/5. Right leg 3/5, left leg 4/5, proximal greater than distal weakness.  Noted R leg flexor spasticity. Reflexes are symmetric, no pathologic reflexes present.  Sensory: Intact to light touch Gait: Deferred today  Labs: I have reviewed the data as listed    Component Value Date/Time   NA 141 05/13/2022 1153   K 3.9 05/13/2022  1153   CL 107 05/13/2022 1153   CO2 25 05/13/2022 1153   GLUCOSE 95 05/13/2022 1153   GLUCOSE 85 10/13/2006 1158   BUN 15 05/13/2022 1153   CREATININE 0.67 05/13/2022 1153   CALCIUM 9.9 05/13/2022 1153   PROT 7.2 05/13/2022 1153   ALBUMIN 4.1 05/13/2022 1153   AST 30 05/13/2022 1153   ALT 25 05/13/2022 1153   ALKPHOS 71 05/13/2022 1153   BILITOT 0.5 05/13/2022 1153   GFRNONAA >60 05/13/2022 1153   GFRAA >60 02/17/2020 0136   Lab Results  Component Value Date   WBC 2.3 (L) 06/04/2022   NEUTROABS 1.5 (L) 06/04/2022   HGB 16.6 (H) 06/04/2022   HCT 48.2 (H) 06/04/2022   MCV 102.8 (H) 06/04/2022   PLT 125 (L) 06/04/2022    Assessment/Plan GBM (glioblastoma multiforme) ([facility])  Bonita A Misch is clinically stable today, now having completed cycle #3 CCNU/avastin.    She may proceed with cycle #4 CCNU 67m/m2 q6 week with Avastin 184mkg IV q3 weeks.  We reviewed potential need for bone marrow support if cytopenias progress during this cycle.  Chemotherapy should be held for the following:  ANC less than 1,000  Platelets less than 100,000  LFT or creatinine greater than 2x ULN  If clinical concerns/contraindications develop  Avastin should be held for the following:  ANC less than 500  Platelets less than 50,000  LFT or creatinine greater than 2x ULN  If clinical concerns/contraindications develop  Will con't Norvasc 29m53maily.  Vimpat will con't 18m1029mD, Eliquis as prior.   Baclofen con't 29mg 49mpRN.  We ask that Kaysa A Diantonio return to clinic in 3 weeks for next avastin infusion.  Next MRI can be following cycle #5.  All questions were answered. The patient knows to call the clinic with any problems, questions or concerns. No barriers to learning were detected.  I have spent a total of 30 minutes of face-to-face and non-face-to-face time, excluding clinical staff time, preparing to see patient, ordering tests and/or medications, counseling the patient,  and independently interpreting results and communicating results to the patient/family/caregiver    ZachaVentura SellersMedical Director of Neuro-Oncology [facility]1/23 10:26 AM

## 2022-06-06 ENCOUNTER — Other Ambulatory Visit: Payer: Self-pay | Admitting: Internal Medicine

## 2022-06-06 ENCOUNTER — Other Ambulatory Visit (HOSPITAL_COMMUNITY): Payer: Self-pay

## 2022-06-06 DIAGNOSIS — C719 Malignant neoplasm of brain, unspecified: Secondary | ICD-10-CM

## 2022-06-07 ENCOUNTER — Other Ambulatory Visit: Payer: Self-pay

## 2022-06-07 ENCOUNTER — Other Ambulatory Visit (HOSPITAL_COMMUNITY): Payer: Self-pay

## 2022-06-10 ENCOUNTER — Telehealth: Payer: Self-pay

## 2022-06-10 ENCOUNTER — Other Ambulatory Visit: Payer: Self-pay

## 2022-06-10 MED ORDER — BACLOFEN 5 MG PO TABS: 5.0000 mg | ORAL_TABLET | Freq: Three times a day (TID) | ORAL | 3 refills | Status: DC

## 2022-06-10 NOTE — Telephone Encounter (Signed)
T/C from pt's husband, Lanny Hurst, requesting a refill for baclofen 5 mg be sent to [facility].  He had requested it earlier but it is not at the pharmacy.  May be due to a power outage.  Rx sent and Lanny Hurst advised

## 2022-06-21 ENCOUNTER — Other Ambulatory Visit (HOSPITAL_COMMUNITY): Payer: Self-pay

## 2022-06-24 ENCOUNTER — Inpatient Hospital Stay: Payer: BC Managed Care – PPO

## 2022-06-24 ENCOUNTER — Other Ambulatory Visit: Payer: Self-pay

## 2022-06-24 ENCOUNTER — Inpatient Hospital Stay (HOSPITAL_BASED_OUTPATIENT_CLINIC_OR_DEPARTMENT_OTHER): Payer: BC Managed Care – PPO | Admitting: Internal Medicine

## 2022-06-24 VITALS — BP 138/89 | HR 87 | Temp 97.9°F | Resp 15 | Wt 146.5 lb

## 2022-06-24 DIAGNOSIS — C719 Malignant neoplasm of brain, unspecified: Secondary | ICD-10-CM

## 2022-06-24 DIAGNOSIS — G40909 Epilepsy, unspecified, not intractable, without status epilepticus: Secondary | ICD-10-CM

## 2022-06-24 DIAGNOSIS — C711 Malignant neoplasm of frontal lobe: Secondary | ICD-10-CM | POA: Diagnosis not present

## 2022-06-24 LAB — CMP (CANCER CENTER ONLY)
ALT: 44 U/L (ref 0–44)
AST: 36 U/L (ref 15–41)
Albumin: 4.1 g/dL (ref 3.5–5.0)
Alkaline Phosphatase: 74 U/L (ref 38–126)
Anion gap: 7 (ref 5–15)
BUN: 17 mg/dL (ref 8–23)
CO2: 26 mmol/L (ref 22–32)
Calcium: 9.8 mg/dL (ref 8.9–10.3)
Chloride: 106 mmol/L (ref 98–111)
Creatinine: 0.69 mg/dL (ref 0.44–1.00)
GFR, Estimated: >60 mL/min (ref >60)
Glucose, Bld: 145 mg/dL — ABNORMAL HIGH (ref 70–99)
Potassium: 3.6 mmol/L (ref 3.5–5.1)
Sodium: 139 mmol/L (ref 135–145)
Total Bilirubin: 0.6 mg/dL (ref 0.3–1.2)
Total Protein: 6.7 g/dL (ref 6.5–8.1)

## 2022-06-24 LAB — CBC WITH DIFFERENTIAL (CANCER CENTER ONLY)
Abs Immature Granulocytes: 0 10*3/uL (ref 0.00–0.07)
Basophils Absolute: 0 10*3/uL (ref 0.0–0.1)
Basophils Relative: 0 %
Eosinophils Absolute: 0.1 10*3/uL (ref 0.0–0.5)
Eosinophils Relative: 2 %
HCT: 43.8 % (ref 36.0–46.0)
Hemoglobin: 15.3 g/dL — ABNORMAL HIGH (ref 12.0–15.0)
Immature Granulocytes: 0 %
Lymphocytes Relative: 16 %
Lymphs Abs: 0.4 10*3/uL — ABNORMAL LOW (ref 0.7–4.0)
MCH: 35.1 pg — ABNORMAL HIGH (ref 26.0–34.0)
MCHC: 34.9 g/dL (ref 30.0–36.0)
MCV: 100.5 fL — ABNORMAL HIGH (ref 80.0–100.0)
Monocytes Absolute: 0.2 10*3/uL (ref 0.1–1.0)
Monocytes Relative: 9 %
Neutro Abs: 1.9 10*3/uL (ref 1.7–7.7)
Neutrophils Relative %: 73 %
Platelet Count: 117 10*3/uL — ABNORMAL LOW (ref 150–400)
RBC: 4.36 MIL/uL (ref 3.87–5.11)
RDW: 12.9 % (ref 11.5–15.5)
WBC Count: 2.6 10*3/uL — ABNORMAL LOW (ref 4.0–10.5)
nRBC: 0 % (ref 0.0–0.2)

## 2022-06-24 LAB — TOTAL PROTEIN, URINE DIPSTICK: Protein, ur: 100 mg/dL — AB

## 2022-06-24 MED ORDER — SODIUM CHLORIDE 0.9 % IV SOLN: Freq: Once | INTRAVENOUS | Status: AC

## 2022-06-24 MED ORDER — SODIUM CHLORIDE 0.9 % IV SOLN
600.0000 mg | Freq: Once | INTRAVENOUS | Status: AC
  Administered 2022-06-24: 600 mg via INTRAVENOUS
  Filled 2022-06-24: qty 16

## 2022-06-24 NOTE — Progress Notes (Signed)
Keep dose at '600mg'$  with slight weight gain.  Dose is rounded for vial size and 600 mg has been her consistent dose.

## 2022-06-24 NOTE — Progress Notes (Signed)
Ok to treat with urine protein of 100 per Dr. Mickeal Skinner.

## 2022-06-24 NOTE — Progress Notes (Signed)
[facility] at [place] [place], [place] [address] [phone]   Interval Evaluation  Date of Service: 06/24/22 Patient Name: Hannah [name] Patient MRN: [id number] Patient DOB: 02-08-1961 Provider: [name], MD  Identifying Statement:  Hannah [name] is a 61 y.o. female with left frontal glioblastoma   Oncologic History: Oncology History  GBM (glioblastoma multiforme) ([facility])   Initial Diagnosis   GBM (glioblastoma multiforme) ([facility])   02/17/2020 Surgery   Craniotomy, left frontal resection by Dr. Marcello Moores.  Path is glioblastoma.   08/28/2020 - 10/09/2020 Radiation Therapy   Radiation and concurrent Temozolomide 30m/m2 daily   11/10/2020 - 12/09/2020 Chemotherapy   Completes 1 cycle of 5-day Temozolomide      [facility]   [facility] for saddle [facility], undergoes thrombectomy and IVC filter placement   03/09/2021 Progression     03/27/2021 -  Chemotherapy   Initiates second line therapy with Avastin 131mkg IV q2 weeks    08/08/2021 - 08/09/2021 Chemotherapy   Patient is on Treatment Plan : BRAIN GLIOBLASTOMA Consolidation Temozolomide Days 1-5 q28 Days      12/03/2021 -  Chemotherapy   Patient is on Treatment Plan : BRAIN Lomustine q42d       Biomarkers:  MGMT Methylated.  IDH 1/2 Wild type.  EGFR Unknown  TERT Unknown   Interval History: Hannah [name] presents today for avastin infusion.  She dosed cycle #4 of CCNU on 06/20/22.  Vimpat has been decreased to 5044mID, no further seizures.  No significant changes with ongoing fatigue, lethargy, continues reliance on family for functional support.  No other new or progressive complaints today.    Decadron 11/09/20: 2mg44m/10/22: 8mg 32m21/22: 6mg 09m7/22: 4mg 0367m/22: 8mg 04/18m22: 6mg 05/121m2: 3mg 05/2329m: 2mg 06/06/27m 1.5mg 06/20/268m-  H+P (01/20/20) Patient presented one month ago with sudden onset left arm and leg weakness and  interrupted speech, [name], [name], stroke eval and CNS imaging which demonstrated a non-enhancing left frontal mass.  At this time she is back to baseline without recurrence of events, taking Keppra 500mg twice p71may.  No history or seizure or any other neurologic events.  She does describe being sleep deprived prior to seizure event.  She presents today after one month follow up MRI scan.  Medications: Current Outpatient Medications on File Prior to Visit  Medication Sig Dispense Refill   amLODipine (NORVASC) 5 MG tablet TAKE 1 TABLET (5 MG TOTAL) BY MOUTH DAILY. 30 tablet 3   apixaban (ELIQUIS) 5 MG TABS tablet TAKE 1 TABLET (5 MG TOTAL) BY MOUTH 2 (TWO) TIMES DAILY. 180 tablet 1   Baclofen 5 MG TABS Take 1 tablet (5 mg) by mouth 3 (three) times daily. 90 tablet 3   cholecalciferol (VITAMIN D3) 25 MCG (1000 UNIT) tablet Take 1,000 Units by mouth daily.     docusate sodium (COLACE) 100 MG capsule Take 100 mg by mouth 2 (two) times daily as needed for mild constipation.     Lacosamide 100 MG TABS Take 1 tablet by mouth in the morning (100mg) and 2 t67mts by mouth at bedtime (200mg) 90 table50m  [DISCONTINUED] lamoTRIgine (LAMICTAL) 100 MG tablet Take 1 tablet (100 mg total) by mouth daily. 30 tablet 3   No current facility-administered medications on file prior to  visit.    Allergies: No Known Allergies Past Medical History:  Past Medical History:  Diagnosis Date   Cervical spondylolysis 12/15/2019   Skeleton: Mild cervical spondylosis C6-7. Incidental find.    Colon polyp    GBM (glioblastoma multiforme) ([facility])    Left ankle sprain 10/07/2011   Seizure ([facility])    partial    Tick bite 02/2020   Past Surgical History:  Past Surgical History:  Procedure Laterality Date   APPLICATION OF CRANIAL NAVIGATION Left 02/16/2020   Procedure: APPLICATION OF CRANIAL  NAVIGATION;  Surgeon: Vallarie Mare, MD;  Location: [facility];  Service: Neurosurgery;  Laterality: Left;   CRANIOTOMY Left 02/16/2020   Procedure: LEFT FRONTAL CRANIOTOMY FOR BRAIN TUMOR;  Surgeon: Vallarie Mare, MD;  Location: [facility];  Service: Neurosurgery;  Laterality: Left;   FOOT SURGERY Left    bone spur - local anesthesia   IR ANGIOGRAM PULMONARY BILATERAL SELECTIVE  12/15/2020   IR ANGIOGRAM SELECTIVE EACH ADDITIONAL VESSEL  12/15/2020   IR ANGIOGRAM SELECTIVE EACH ADDITIONAL VESSEL  12/15/2020   IR IVC FILTER PLMT / S&I /IMG GUID/MOD SED  12/15/2020   IR THROMBECT PRIM MECH INIT (INCLU) MOD SED  12/15/2020   IR THROMBECT PRIM MECH INIT (INCLU) MOD SED  12/15/2020   RADIOLOGY WITH ANESTHESIA N/A 12/15/2020   Procedure: IR WITH ANESTHESIA;  Surgeon: Radiologist, Medication, MD;  Location: [facility];  Service: Radiology;  Laterality: N/A;   TONSILECTOMY/ADENOIDECTOMY WITH MYRINGOTOMY     tonsils and adenoids out only   WISDOM TOOTH EXTRACTION     Social History:  Social History   Socioeconomic History   Marital status: Married    Spouse name: Not on file   Number of children: 3   Years of education: Not on file   Highest education level: Not on file  Occupational History   Not on file  Tobacco Use   Smoking status: Former    Years: 2.00    Types: Cigarettes    Quit date: 11/05/1979    Years since quitting: 42.6   Smokeless tobacco: Never   Tobacco comments:    1/2 pack a week  Vaping Use   Vaping Use: Never used  Substance and Sexual Activity   Alcohol use: Yes    Alcohol/week: 0.0 standard drinks of alcohol    Comment: 1 glass of wine weekly   Drug use: No   Sexual activity: Yes    Birth control/protection: Surgical  Other Topics Concern   Not on file  Social History Narrative   Not on file   Social Determinants of Health   Financial Resource Strain: Not on file  Food Insecurity: Not on file  Transportation Needs: Not on file  Physical Activity: Not on file   Stress: Not on file  Social Connections: Not on file  Intimate Partner Violence: Not on file   Family History:  Family History  Problem Relation Age of Onset   Hypertension Mother    Stroke Mother    Diabetes Mother    Colon cancer Father 65   Hypertension Sister    Colon polyps Sister    Heart disease Brother 3   Diabetes Maternal Uncle    Diabetes Maternal Uncle     Review of Systems: Constitutional: Doesn't report fevers, chills or abnormal weight loss Eyes: Doesn't report blurriness of vision Ears, nose, mouth, throat, and face: Doesn't report sore throat Respiratory: Chest wall pain Cardiovascular: Doesn't report palpitation, chest discomfort  Gastrointestinal:  Doesn't report  nausea, constipation, diarrhea GU: Doesn't report incontinence Skin: Doesn't report skin rashes Neurological: Per HPI Musculoskeletal: normal Behavioral/Psych: Doesn't report anxiety  Physical Exam: Vitals:   06/24/22 1051  BP: 138/89  Pulse: 87  Resp: 15  Temp: 97.9 F (36.6 C)  SpO2: 98%   KPS: 60. General: Alert, cooperative, pleasant, in no acute distress Head: Normal EENT: No conjunctival injection or scleral icterus.  Lungs: Resp effort normal Cardiac: Regular rate Abdomen: Non-distended abdomen Skin: No rashes cyanosis or petechiae. Extremities: No clubbing or edema  Neurologic Exam: Mental Status: Awake, alert, attentive to examiner. Both receptive and expressive dysphasia.  Cranial Nerves: Visual acuity is grossly normal. Visual fields are full. Extra-ocular movements intact. No ptosis. Face is symmetric Motor: Tone and bulk are normal. R arm 1/5. Right leg 3/5, left leg 4/5, proximal greater than distal weakness.  Noted R leg flexor spasticity. Reflexes are symmetric, no pathologic reflexes present.  Sensory: Intact to light touch Gait: Deferred today  Labs: I have reviewed the data as listed    Component Value Date/Time   NA 141 06/04/2022 1011   K 3.8 06/04/2022  1011   CL 108 06/04/2022 1011   CO2 25 06/04/2022 1011   GLUCOSE 106 (H) 06/04/2022 1011   GLUCOSE 85 10/13/2006 1158   BUN 15 06/04/2022 1011   CREATININE 0.69 06/04/2022 1011   CALCIUM 9.7 06/04/2022 1011   PROT 7.2 06/04/2022 1011   ALBUMIN 4.2 06/04/2022 1011   AST 30 06/04/2022 1011   ALT 30 06/04/2022 1011   ALKPHOS 71 06/04/2022 1011   BILITOT 0.5 06/04/2022 1011   GFRNONAA >60 06/04/2022 1011   GFRAA >60 02/17/2020 0136   Lab Results  Component Value Date   WBC 2.6 (L) 06/24/2022   NEUTROABS 1.9 06/24/2022   HGB 15.3 (H) 06/24/2022   HCT 43.8 06/24/2022   MCV 100.5 (H) 06/24/2022   PLT 117 (L) 06/24/2022    Assessment/Plan GBM (glioblastoma multiforme) (HCC)  Seizure disorder (HCC)  Hannah [name] is clinically stable today.  No new or progressive changes.    She may proceed with day 5/42 cycle #4 CCNU 56m/m2 q6 week with Avastin 1670mkg IV q3 weeks.  We reviewed potential need for bone marrow support if cytopenias progress during this cycle.  Chemotherapy should be held for the following:  ANC less than 1,000  Platelets less than 100,000  LFT or creatinine greater than 2x ULN  If clinical concerns/contraindications develop  Avastin should be held for the following:  ANC less than 500  Platelets less than 50,000  LFT or creatinine greater than 2x ULN  If clinical concerns/contraindications develop  Will con't Norvasc 70m28maily.  Vimpat may decrease to 19m37mD if preferred; con't Eliquis as prior.   Baclofen con't 70mg 17mpRN.  We ask that Hannah [name] return to clinic in 3 weeks for next avastin infusion.  Next MRI can be following cycle #5.  All questions were answered. The patient knows to call the clinic with any problems, questions or concerns. No barriers to learning were detected.  I have spent a total of 30 minutes of face-to-face and non-face-to-face time, excluding clinical staff time, preparing to see patient, ordering tests  and/or medications, counseling the patient, and independently interpreting results and communicating results to the patient/family/caregiver    ZachaVentura SellersMedical Director of Neuro-Oncology [facility]le[facility]1/23 10:57 AM

## 2022-06-24 NOTE — Patient Instructions (Signed)
[facility] ONCOL[facility]   Discharge Instructions: Thank you for choosing [facility] to provide your oncology and hematology care.   If you have a lab appointment with the [facility], please go directly to the [facility] and check in at the registration area.   Wear comfortable clothing and clothing appropriate for easy access to any Portacath or PICC line.   We strive to give you quality time with your provider. You may need to reschedule your appointment if you arrive late (15 or more minutes).  Arriving late affects you and other patients whose appointments are after yours.  Also, if you miss three or more appointments without notifying the office, you may be dismissed from the clinic at the provider's discretion.      For prescription refill requests, have your pharmacy contact our office and allow 72 hours for refills to be completed.    Today you received the following chemotherapy and/or immunotherapy agents: [id number]      To help prevent nausea and vomiting after your treatment, we encourage you to take your nausea medication as directed.  BELOW ARE SYMPTOMS THAT SHOULD BE REPORTED IMMEDIATELY: *FEVER GREATER THAN 100.4 F (38 C) OR HIGHER *CHILLS OR SWEATING *NAUSEA AND VOMITING THAT IS NOT CONTROLLED WITH YOUR NAUSEA MEDICATION *UNUSUAL SHORTNESS OF BREATH *UNUSUAL BRUISING OR BLEEDING *URINARY PROBLEMS (pain or burning when urinating, or frequent urination) *BOWEL PROBLEMS (unusual diarrhea, constipation, pain near the anus) TENDERNESS IN MOUTH AND THROAT WITH OR WITHOUT PRESENCE OF ULCERS (sore throat, sores in mouth, or a toothache) UNUSUAL RASH, SWELLING OR PAIN  UNUSUAL VAGINAL DISCHARGE OR ITCHING   Items with * indicate a potential emergency and should be followed up as soon as possible or go to the Emergency Department if any problems should occur.  Please show the CHEMOTHERAPY ALERT CARD or IMMUNOTHERAPY ALERT CARD at  check-in to the Emergency Department and triage nurse.  Should you have questions after your visit or need to cancel or reschedule your appointment, please contact [facility]  Dept: [phone]  and follow the prompts.  Office hours are 8:00 a.m. to 4:30 p.m. Monday - Friday. Please note that voicemails left after 4:00 p.m. may not be returned until the following business day.  We are closed weekends and major holidays. You have access to a nurse at all times for urgent questions. Please call the main number to the clinic Dept: [phone] and follow the prompts.   For any non-urgent questions, you may also contact your provider using MyChart. We now offer e-Visits for anyone 47 and older to request care online for non-urgent symptoms. For details visit [email or web address].   Also download the MyChart app! Go to the app store, search "MyChart", open the app, select [facility], and log in with your MyChart username and password.  Masks are optional in the cancer centers. If you would like for your care team to wear a mask while they are taking care of you, please let them know. You may have one support person who is at least 61 years old accompany you for your appointments.

## 2022-06-25 ENCOUNTER — Other Ambulatory Visit (HOSPITAL_COMMUNITY): Payer: Self-pay

## 2022-06-26 ENCOUNTER — Other Ambulatory Visit: Payer: Self-pay

## 2022-07-05 ENCOUNTER — Other Ambulatory Visit: Payer: Self-pay

## 2022-07-17 ENCOUNTER — Other Ambulatory Visit: Payer: Self-pay

## 2022-07-18 ENCOUNTER — Inpatient Hospital Stay: Payer: BC Managed Care – PPO | Attending: Internal Medicine

## 2022-07-18 ENCOUNTER — Inpatient Hospital Stay: Payer: BC Managed Care – PPO | Admitting: Internal Medicine

## 2022-07-18 ENCOUNTER — Inpatient Hospital Stay: Payer: BC Managed Care – PPO

## 2022-07-18 ENCOUNTER — Other Ambulatory Visit: Payer: Self-pay

## 2022-07-18 VITALS — BP 124/84

## 2022-07-18 VITALS — BP 138/102 | HR 97 | Temp 97.7°F | Resp 16 | Ht 65.0 in

## 2022-07-18 DIAGNOSIS — C719 Malignant neoplasm of brain, unspecified: Secondary | ICD-10-CM

## 2022-07-18 DIAGNOSIS — Z5112 Encounter for antineoplastic immunotherapy: Secondary | ICD-10-CM | POA: Insufficient documentation

## 2022-07-18 DIAGNOSIS — G40909 Epilepsy, unspecified, not intractable, without status epilepticus: Secondary | ICD-10-CM | POA: Insufficient documentation

## 2022-07-18 DIAGNOSIS — Z87891 Personal history of nicotine dependence: Secondary | ICD-10-CM | POA: Insufficient documentation

## 2022-07-18 DIAGNOSIS — C711 Malignant neoplasm of frontal lobe: Secondary | ICD-10-CM | POA: Diagnosis present

## 2022-07-18 LAB — CBC WITH DIFFERENTIAL (CANCER CENTER ONLY)
Abs Immature Granulocytes: 0 10*3/uL (ref 0.00–0.07)
Basophils Absolute: 0 10*3/uL (ref 0.0–0.1)
Basophils Relative: 0 %
Eosinophils Absolute: 0.1 10*3/uL (ref 0.0–0.5)
Eosinophils Relative: 3 %
HCT: 45.8 % (ref 36.0–46.0)
Hemoglobin: 15.6 g/dL — ABNORMAL HIGH (ref 12.0–15.0)
Immature Granulocytes: 0 %
Lymphocytes Relative: 14 %
Lymphs Abs: 0.4 10*3/uL — ABNORMAL LOW (ref 0.7–4.0)
MCH: 35.1 pg — ABNORMAL HIGH (ref 26.0–34.0)
MCHC: 34.1 g/dL (ref 30.0–36.0)
MCV: 102.9 fL — ABNORMAL HIGH (ref 80.0–100.0)
Monocytes Absolute: 0.3 10*3/uL (ref 0.1–1.0)
Monocytes Relative: 10 %
Neutro Abs: 1.9 10*3/uL (ref 1.7–7.7)
Neutrophils Relative %: 73 %
Platelet Count: 57 10*3/uL — ABNORMAL LOW (ref 150–400)
RBC: 4.45 MIL/uL (ref 3.87–5.11)
RDW: 13.7 % (ref 11.5–15.5)
WBC Count: 2.6 10*3/uL — ABNORMAL LOW (ref 4.0–10.5)
nRBC: 0 % (ref 0.0–0.2)

## 2022-07-18 LAB — CMP (CANCER CENTER ONLY)
ALT: 81 U/L — ABNORMAL HIGH (ref 0–44)
AST: 54 U/L — ABNORMAL HIGH (ref 15–41)
Albumin: 3.6 g/dL (ref 3.5–5.0)
Alkaline Phosphatase: 92 U/L (ref 38–126)
Anion gap: 10 (ref 5–15)
BUN: 20 mg/dL (ref 8–23)
CO2: 22 mmol/L (ref 22–32)
Calcium: 9.5 mg/dL (ref 8.9–10.3)
Chloride: 109 mmol/L (ref 98–111)
Creatinine: 0.57 mg/dL (ref 0.44–1.00)
GFR, Estimated: >60 mL/min (ref >60)
Glucose, Bld: 109 mg/dL — ABNORMAL HIGH (ref 70–99)
Potassium: 3.7 mmol/L (ref 3.5–5.1)
Sodium: 141 mmol/L (ref 135–145)
Total Bilirubin: 0.7 mg/dL (ref 0.3–1.2)
Total Protein: 6.8 g/dL (ref 6.5–8.1)

## 2022-07-18 LAB — TOTAL PROTEIN, URINE DIPSTICK: Protein, ur: 300 mg/dL — AB

## 2022-07-18 MED ORDER — LACOSAMIDE 100 MG PO TABS: 100.0000 mg | ORAL_TABLET | Freq: Two times a day (BID) | ORAL | 1 refills | Status: DC

## 2022-07-18 MED ORDER — SODIUM CHLORIDE 0.9 % IV SOLN: Freq: Once | INTRAVENOUS | Status: AC

## 2022-07-18 MED ORDER — SODIUM CHLORIDE 0.9 % IV SOLN
600.0000 mg | Freq: Once | INTRAVENOUS | Status: AC
  Administered 2022-07-18: 600 mg via INTRAVENOUS
  Filled 2022-07-18: qty 16

## 2022-07-18 NOTE — Progress Notes (Signed)
Ok to treat with Avastin with BP and urine protein per Dr Mickeal Skinner.

## 2022-07-18 NOTE — Patient Instructions (Signed)
[facility] ONCOLOGY   Discharge Instructions: Thank you for choosing [facility] to provide your oncology and hematology care.   If you have a lab appointment with the [facility], please go directly to the [facility] and check in at the registration area.   Wear comfortable clothing and clothing appropriate for easy access to any Portacath or PICC line.   We strive to give you quality time with your provider. You may need to reschedule your appointment if you arrive late (15 or more minutes).  Arriving late affects you and other patients whose appointments are after yours.  Also, if you miss three or more appointments without notifying the office, you may be dismissed from the clinic at the provider's discretion.      For prescription refill requests, have your pharmacy contact our office and allow 72 hours for refills to be completed.    Today you received the following chemotherapy and/or immunotherapy agents: [id number]-awwb      To help prevent nausea and vomiting after your treatment, we encourage you to take your nausea medication as directed.  BELOW ARE SYMPTOMS THAT SHOULD BE REPORTED IMMEDIATELY: *FEVER GREATER THAN 100.4 F (38 C) OR HIGHER *CHILLS OR SWEATING *NAUSEA AND VOMITING THAT IS NOT CONTROLLED WITH YOUR NAUSEA MEDICATION *UNUSUAL SHORTNESS OF BREATH *UNUSUAL BRUISING OR BLEEDING *URINARY PROBLEMS (pain or burning when urinating, or frequent urination) *BOWEL PROBLEMS (unusual diarrhea, constipation, pain near the anus) TENDERNESS IN MOUTH AND THROAT WITH OR WITHOUT PRESENCE OF ULCERS (sore throat, sores in mouth, or a toothache) UNUSUAL RASH, SWELLING OR PAIN  UNUSUAL VAGINAL DISCHARGE OR ITCHING   Items with * indicate a potential emergency and should be followed up as soon as possible or go to the Emergency Department if any problems should occur.  Please show the CHEMOTHERAPY ALERT CARD or IMMUNOTHERAPY ALERT CARD at  check-in to the Emergency Department and triage nurse.  Should you have questions after your visit or need to cancel or reschedule your appointment, please contact [facility]  Dept: [phone]  and follow the prompts.  Office hours are 8:00 a.m. to 4:30 p.m. Monday - Friday. Please note that voicemails left after 4:00 p.m. may not be returned until the following business day.  We are closed weekends and major holidays. You have access to a nurse at all times for urgent questions. Please call the main number to the clinic Dept: [phone] and follow the prompts.   For any non-urgent questions, you may also contact your provider using MyChart. We now offer e-Visits for anyone 59 and older to request care online for non-urgent symptoms. For details visit [email or web address].   Also download the MyChart app! Go to the app store, search "MyChart", open the app, select [facility], and log in with your MyChart username and password.  Masks are optional in the cancer centers. If you would like for your care team to wear a mask while they are taking care of you, please let them know. You may have one support Hannah [name] who is at least 61 years old accompany you for your appointments.

## 2022-07-18 NOTE — Progress Notes (Signed)
[facility] [place], [place] [address] [phone]   Interval Evaluation  Date of Service: 07/18/22 Patient Name: Hannah [name] Patient MRN: [id number] Patient DOB: [birth year] Provider: [name], MD  Identifying Statement:  Hannah [name] is a 61 y.o. female with left frontal glioblastoma   Oncologic History: Oncology History  GBM (glioblastoma multiforme) ([facility])   Initial Diagnosis   GBM (glioblastoma multiforme) ([facility])   02/17/2020 Surgery   Craniotomy, left frontal resection by Dr. Marcello Moores.  Path is glioblastoma.   08/28/2020 - 10/09/2020 Radiation Therapy   Radiation and concurrent Temozolomide 27m/m2 daily   11/10/2020 - 12/09/2020 Chemotherapy   Completes 1 cycle of 5-day Temozolomide      [facility]   Admitted for saddle PE, undergoes thrombectomy and IVC filter placement   03/09/2021 Progression     03/27/2021 -  Chemotherapy   Initiates second line therapy with Avastin 121mkg IV q2 weeks    08/08/2021 - 08/09/2021 Chemotherapy   Patient is on Treatment Plan : BRAIN GLIOBLASTOMA Consolidation Temozolomide Days 1-5 q28 Days      12/03/2021 -  Chemotherapy   Patient is on Treatment Plan : BRAIN L[place]ustine q42d       Biomarkers:  MGMT Methylated.  IDH 1/2 Wild type.  EGFR Unknown  TERT Unknown   Interval History: Hannah [name] presents today for avastin infusion.  She dosed cycle #4 of CCNU on 06/20/22.  Vimpat currently at 5071mID.  Leg swelling a bit worse today.  No significant changes with ongoing fatigue, lethargy, continues reliance on family for functional support.  No other new or progressive complaints today.    Decadron 11/09/20: 2mg101m/10/22: 8mg 70m21/22: 6mg 034m7/22: 4mg 0357m/22: 8mg 04/92m22: 6mg 05/156m2: 3mg 05/2322m: 2mg 06/06/64m 1.5mg 06/20/262m-  H+P (01/20/20) Patient presented one month ago with sudden onset left arm and leg weakness and  interrupted speech, [name], [name], stroke eval and CNS imaging which demonstrated a non-enhancing left frontal mass.  At this time she is back to baseline without recurrence of events, taking Keppra 500mg twice p73may.  No history or seizure or any other neurologic events.  She does describe being sleep deprived prior to seizure event.  She presents today after one month follow up MRI scan.  Medications: Current Outpatient Medications on File Prior to Visit  Medication Sig Dispense Refill   amLODipine (NORVASC) 5 MG tablet TAKE 1 TABLET (5 MG TOTAL) BY MOUTH DAILY. 30 tablet 3   apixaban (ELIQUIS) 5 MG TABS tablet TAKE 1 TABLET (5 MG TOTAL) BY MOUTH 2 (TWO) TIMES DAILY. 180 tablet 1   Baclofen 5 MG TABS Take 1 tablet (5 mg) by mouth 3 (three) times daily. 90 tablet 3   cholecalciferol (VITAMIN D3) 25 MCG (1000 UNIT) tablet Take 1,000 Units by mouth daily.     docusate sodium (COLACE) 100 MG capsule Take 100 mg by mouth 2 (two) times daily as needed for mild constipation.     Lacosamide 100 MG TABS Take 1 tablet by mouth in the morning (100mg) and 2 t63mts by mouth at bedtime (200mg) 90 table31m  [DISCONTINUED] lamoTRIgine (LAMICTAL) 100 MG tablet Take 1 tablet (100 mg total) by mouth daily. 30 tablet 3   No current facility-administered medications on file  prior to visit.    Allergies: No Known Allergies Past Medical History:  Past Medical History:  Diagnosis Date   Cervical spondylolysis 12/15/2019   Skeleton: Mild cervical spondylosis C6-7. Incidental find.    Colon polyp    GBM (glioblastoma multiforme) ([facility])    Left ankle sprain 10/07/2011   Seizure ([facility])    partial    Tick bite 02/2020   Past Surgical History:  Past Surgical History:  Procedure Laterality Date   APPLICATION OF CRANIAL NAVIGATION Left 02/16/2020   Procedure: APPLICATION OF CRANIAL  NAVIGATION;  Surgeon: Vallarie Mare, MD;  Location: [facility];  Service: Neurosurgery;  Laterality: Left;   CRANIOTOMY Left 02/16/2020   Procedure: LEFT FRONTAL CRANIOTOMY FOR BRAIN TUMOR;  Surgeon: Vallarie Mare, MD;  Location: [facility];  Service: Neurosurgery;  Laterality: Left;   FOOT SURGERY Left    bone spur - local anesthesia   IR ANGIOGRAM PULMONARY BILATERAL SELECTIVE  12/15/2020   IR ANGIOGRAM SELECTIVE EACH ADDITIONAL VESSEL  12/15/2020   IR ANGIOGRAM SELECTIVE EACH ADDITIONAL VESSEL  12/15/2020   IR IVC FILTER PLMT / S&I /IMG GUID/MOD SED  12/15/2020   IR THROMBECT PRIM MECH INIT (INCLU) MOD SED  12/15/2020   IR THROMBECT PRIM MECH INIT (INCLU) MOD SED  12/15/2020   RADIOLOGY WITH ANESTHESIA N/A 12/15/2020   Procedure: IR WITH ANESTHESIA;  Surgeon: Radiologist, Medication, MD;  Location: [facility];  Service: Radiology;  Laterality: N/A;   TONSILECTOMY/ADENOIDECTOMY WITH MYRINGOTOMY     tonsils and adenoids out only   WISDOM TOOTH EXTRACTION     Social History:  Social History   Socioeconomic History   Marital status: Married    Spouse name: Not on file   Number of children: 3   Years of education: Not on file   Highest education level: Not on file  Occupational History   Not on file  Tobacco Use   Smoking status: Former    Years: 2.00    Types: Cigarettes    Quit date: 11/05/1979    Years since quitting: 42.7   Smokeless tobacco: Never   Tobacco comments:    1/2 pack a week  Vaping Use   Vaping Use: Never used  Substance and Sexual Activity   Alcohol use: Yes    Alcohol/week: 0.0 standard drinks of alcohol    Comment: 1 glass of wine weekly   Drug use: No   Sexual activity: Yes    Birth control/protection: Surgical  Other Topics Concern   Not on file  Social History Narrative   Not on file   Social Determinants of Health   Financial Resource Strain: Not on file  Food Insecurity: Not on file  Transportation Needs: Not on file  Physical Activity: Not on file   Stress: Not on file  Social Connections: Not on file  Intimate Partner Violence: Not on file   Family History:  Family History  Problem Relation Age of Onset   Hypertension Mother    Stroke Mother    Diabetes Mother    Colon cancer Father 38   Hypertension Sister    Colon polyps Sister    Heart disease Brother 51   Diabetes Maternal Uncle    Diabetes Maternal Uncle     Review of Systems: Constitutional: Doesn't report fevers, chills or abnormal weight loss Eyes: Doesn't report blurriness of vision Ears, nose, mouth, throat, and face: Doesn't report sore throat Respiratory: Chest wall pain Cardiovascular: Doesn't report palpitation, chest discomfort  Gastrointestinal:  Doesn't report nausea, constipation, diarrhea GU: Doesn't report incontinence Skin: Doesn't report skin rashes Neurological: Per HPI Musculoskeletal: normal Behavioral/Psych: Doesn't report anxiety  Physical Exam: Vitals:   07/18/22 1052  BP: (!) 138/102  Pulse: 97  Resp: 16  Temp: 97.7 F (36.5 C)  SpO2: 97%    KPS: 60. General: Alert, cooperative, pleasant, in no acute distress Head: Normal EENT: No conjunctival injection or scleral icterus.  Lungs: Resp effort normal Cardiac: Regular rate Abdomen: Non-distended abdomen Skin: No rashes cyanosis or petechiae. Extremities: No clubbing or edema  Neurologic Exam: Mental Status: Awake, alert, attentive to examiner. Both receptive and expressive dysphasia.  Cranial Nerves: Visual acuity is grossly normal. Visual fields are full. Extra-ocular movements intact. No ptosis. Face is symmetric Motor: Tone and bulk are normal. R arm 1/5. Right leg 3/5, left leg 4/5, proximal greater than distal weakness.  Noted R leg flexor spasticity. Reflexes are symmetric, no pathologic reflexes present.  Sensory: Intact to light touch Gait: Deferred today  Labs: I have reviewed the data as listed    Component Value Date/Time   NA 139 06/24/2022 1016   K 3.6  06/24/2022 1016   CL 106 06/24/2022 1016   CO2 26 06/24/2022 1016   GLUCOSE 145 (H) 06/24/2022 1016   GLUCOSE 85 10/13/2006 1158   BUN 17 06/24/2022 1016   CREATININE 0.69 06/24/2022 1016   CALCIUM 9.8 06/24/2022 1016   PROT 6.7 06/24/2022 1016   ALBUMIN 4.1 06/24/2022 1016   AST 36 06/24/2022 1016   ALT 44 06/24/2022 1016   ALKPHOS 74 06/24/2022 1016   BILITOT 0.6 06/24/2022 1016   GFRNONAA >60 06/24/2022 1016   GFRAA >60 02/17/2020 0136   Lab Results  Component Value Date   WBC 2.6 (L) 07/18/2022   NEUTROABS 1.9 07/18/2022   HGB 15.6 (H) 07/18/2022   HCT 45.8 07/18/2022   MCV 102.9 (H) 07/18/2022   PLT 57 (L) 07/18/2022    Assessment/Plan GBM (glioblastoma multiforme) (HCC)  Seizure disorder (HCC)  Hannah [name] is clinically stable today.  Labs today demonstrate thrombocytopenia.  She may proceed with day 29/42 cycle #4 CCNU 36m/m2 q6 week with Avastin 118mkg IV q3 weeks.  We reviewed potential need for bone marrow support if cytopenias progress during this cycle.  Chemotherapy should be held for the following:  ANC less than 1,000  Platelets less than 100,000  LFT or creatinine greater than 2x ULN  If clinical concerns/contraindications develop  Avastin should be held for the following:  ANC less than 500  Platelets less than 50,000  LFT or creatinine greater than 2x ULN  If clinical concerns/contraindications develop  Will con't Norvasc 36m74maily.  Could consider switch to ACE/ARB if leg edema persists or worsens.  Vimpat may decrease to 3m62mD if preferred; con't Eliquis as prior.   Baclofen con't 36mg 59mpRN.  We ask that Hannah [name] return to clinic in 3 weeks for next avastin infusion.  Next MRI can be following cycle #5.  All questions were answered. The patient knows to call the clinic with any problems, questions or concerns. No barriers to learning were detected.  I have spent a total of 30 minutes of face-to-face and  non-face-to-face time, excluding clinical staff time, preparing to see patient, ordering tests and/or medications, counseling the patient, and independently interpreting results and communicating results to the patient/family/caregiver    ZachaVentura SellersMedical Director of Neuro-Oncology [facility]4/23 10:39  AM

## 2022-07-18 NOTE — Progress Notes (Signed)
07/18/2022 ok to treat with Avastin today per Dr Mickeal Skinner.

## 2022-07-18 NOTE — Progress Notes (Signed)
Per Dr. Mickeal Skinner, okay to treat with urine protein 300

## 2022-07-19 ENCOUNTER — Inpatient Hospital Stay: Payer: BC Managed Care – PPO

## 2022-07-19 NOTE — Progress Notes (Signed)
Hannah [name]  Initial Assessment   Hannah [name] is a 61 y.o. year old female contacted caregiver by phone. Clinical Social [name] was referred by nurse for assessment of psychosocial needs.   SDOH (Social Determinants of Health) assessments performed: Yes SDOH Interventions    Flowsheet Row Clinical Support from 07/19/2022 in [facility]  SDOH Interventions   Food Insecurity Interventions Intervention Not Indicated  Housing Interventions Intervention Not Indicated  Transportation Interventions Intervention Not Indicated  Utilities Interventions Intervention Not Indicated       SDOH Screenings   Food Insecurity: No Food Insecurity (07/19/2022)  Housing: Low Risk  (07/19/2022)  Transportation Needs: No Transportation Needs (07/19/2022)  Utilities: Not At Risk (07/19/2022)  Depression (PHQ2-9): Low Risk  (08/02/2020)  Financial Resource Strain: Low Risk  (07/19/2022)  Tobacco Use: Medium Risk (04/23/2021)     Distress Screen completed: No     No data to display            Family/Social Information:  Housing Arrangement: patient lives with spouse, Hannah [name] . Family members/support persons in your life? Family and Friends.  Patient's daughter is able to remain with patient during the day while Hannah [name] continues working full-time. Transportation concerns: no  Employment: Disabled Patient used to [name] for [employer].  Income source: Supported by [employer] and [employer] Financial concerns: Yes, due to illness and/or loss of [name] during treatment Type of concern: Medical bills Food access concerns: no Religious or spiritual practice: Not known Services Currently in place:  [employer].  Coping/ Adjustment to diagnosis: Patient understands treatment plan and what happens next? Patient's spouse understands. Concerns about diagnosis and/or treatment: How I will pay for the services I need Husband's reported stressors: Finances Hopes and/or  priorities: Priority is for patient to remain at home. Patient enjoys time with family/ friends Current coping skills/ strengths: Supportive family/friends     SUMMARY: Current SDOH Barriers:  Inability to perform IADL's independently.  Hannah [name] has difficulty communicating at times.  Her ability to assist with transfers is limited.  Clinical Social [name] Clinical Goal(s):  Explore community resource options for unmet needs related to:  Financial Strain Hannah [name] applied for disability for patient in April.  In May, he contacted [employer] and was informed that her application was being processed.  Interventions: Discussed common feeling and emotions when being diagnosed with cancer, and the importance of support during treatment Informed patient of the support team roles and support services at [facility] Provided CSW contact information and encouraged patient to call with any questions or concerns Provided patient with information about  the process of obtaining disability.  CSW contacted Cecille Rubin at the [facility] and asked about assisting patient's husband.   Follow Up Plan: Patient's husband will contact CSW with any support or resource needs.  Husband verbalizes understanding of plan: Yes    Rodman Pickle Josejulian Tarango, LCSW

## 2022-07-20 ENCOUNTER — Other Ambulatory Visit: Payer: Self-pay

## 2022-07-22 ENCOUNTER — Inpatient Hospital Stay: Payer: BC Managed Care – PPO

## 2022-07-22 NOTE — Progress Notes (Signed)
[facility] CSW Progress Note  Clinical Education officer, museum contacted caregiver by phone to follow up regarding patient's application for disability.  CSW had contacted Lubertha Sayres, DAP Case Manager at [facility] to ask for direction.  CSW phoned Lanny Hurst and informed him that her suggestion was to contact [employer] weekly and inform them of patient's cancer diagnosis to help expedite the process.  Lanny Hurst said he understood and would increase his contact with [employer].    Rodman Pickle Mileidy Atkin, LCSW

## 2022-07-23 ENCOUNTER — Other Ambulatory Visit (HOSPITAL_COMMUNITY): Payer: Self-pay

## 2022-08-05 ENCOUNTER — Other Ambulatory Visit: Payer: Self-pay

## 2022-08-06 ENCOUNTER — Other Ambulatory Visit: Payer: Self-pay | Admitting: Internal Medicine

## 2022-08-06 DIAGNOSIS — C719 Malignant neoplasm of brain, unspecified: Secondary | ICD-10-CM

## 2022-08-08 ENCOUNTER — Other Ambulatory Visit: Payer: Self-pay

## 2022-08-08 ENCOUNTER — Inpatient Hospital Stay: Payer: BC Managed Care – PPO | Admitting: Internal Medicine

## 2022-08-08 ENCOUNTER — Other Ambulatory Visit (HOSPITAL_COMMUNITY): Payer: Self-pay

## 2022-08-08 ENCOUNTER — Inpatient Hospital Stay: Payer: BC Managed Care – PPO

## 2022-08-08 ENCOUNTER — Inpatient Hospital Stay: Payer: BC Managed Care – PPO | Attending: Internal Medicine

## 2022-08-08 VITALS — BP 147/89 | HR 93 | Temp 97.9°F | Resp 16 | Ht 65.0 in | Wt 146.2 lb

## 2022-08-08 DIAGNOSIS — Z5189 Encounter for other specified aftercare: Secondary | ICD-10-CM | POA: Diagnosis not present

## 2022-08-08 DIAGNOSIS — Z923 Personal history of irradiation: Secondary | ICD-10-CM | POA: Diagnosis not present

## 2022-08-08 DIAGNOSIS — Z79899 Other long term (current) drug therapy: Secondary | ICD-10-CM | POA: Insufficient documentation

## 2022-08-08 DIAGNOSIS — C719 Malignant neoplasm of brain, unspecified: Secondary | ICD-10-CM | POA: Diagnosis not present

## 2022-08-08 DIAGNOSIS — D696 Thrombocytopenia, unspecified: Secondary | ICD-10-CM | POA: Diagnosis not present

## 2022-08-08 DIAGNOSIS — Z5112 Encounter for antineoplastic immunotherapy: Secondary | ICD-10-CM | POA: Insufficient documentation

## 2022-08-08 DIAGNOSIS — D702 Other drug-induced agranulocytosis: Secondary | ICD-10-CM

## 2022-08-08 DIAGNOSIS — D709 Neutropenia, unspecified: Secondary | ICD-10-CM | POA: Diagnosis not present

## 2022-08-08 DIAGNOSIS — Z7901 Long term (current) use of anticoagulants: Secondary | ICD-10-CM | POA: Diagnosis not present

## 2022-08-08 DIAGNOSIS — Z87891 Personal history of nicotine dependence: Secondary | ICD-10-CM | POA: Insufficient documentation

## 2022-08-08 DIAGNOSIS — Z79630 Long term (current) use of alkylating agent: Secondary | ICD-10-CM | POA: Insufficient documentation

## 2022-08-08 DIAGNOSIS — G40909 Epilepsy, unspecified, not intractable, without status epilepticus: Secondary | ICD-10-CM | POA: Diagnosis not present

## 2022-08-08 DIAGNOSIS — C711 Malignant neoplasm of frontal lobe: Secondary | ICD-10-CM | POA: Insufficient documentation

## 2022-08-08 LAB — CMP (CANCER CENTER ONLY)
ALT: 57 U/L — ABNORMAL HIGH (ref 0–44)
AST: 45 U/L — ABNORMAL HIGH (ref 15–41)
Albumin: 4 g/dL (ref 3.5–5.0)
Alkaline Phosphatase: 106 U/L (ref 38–126)
Anion gap: 8 (ref 5–15)
BUN: 22 mg/dL (ref 8–23)
CO2: 25 mmol/L (ref 22–32)
Calcium: 9.3 mg/dL (ref 8.9–10.3)
Chloride: 108 mmol/L (ref 98–111)
Creatinine: 0.68 mg/dL (ref 0.44–1.00)
GFR, Estimated: >60 mL/min (ref >60)
Glucose, Bld: 121 mg/dL — ABNORMAL HIGH (ref 70–99)
Potassium: 3.7 mmol/L (ref 3.5–5.1)
Sodium: 141 mmol/L (ref 135–145)
Total Bilirubin: 0.5 mg/dL (ref 0.3–1.2)
Total Protein: 7.1 g/dL (ref 6.5–8.1)

## 2022-08-08 LAB — CBC WITH DIFFERENTIAL (CANCER CENTER ONLY)
Abs Immature Granulocytes: 0 10*3/uL (ref 0.00–0.07)
Basophils Absolute: 0 10*3/uL (ref 0.0–0.1)
Basophils Relative: 0 %
Eosinophils Absolute: 0.1 10*3/uL (ref 0.0–0.5)
Eosinophils Relative: 4 %
HCT: 43.2 % (ref 36.0–46.0)
Hemoglobin: 14.9 g/dL (ref 12.0–15.0)
Immature Granulocytes: 0 %
Lymphocytes Relative: 23 %
Lymphs Abs: 0.4 10*3/uL — ABNORMAL LOW (ref 0.7–4.0)
MCH: 35.2 pg — ABNORMAL HIGH (ref 26.0–34.0)
MCHC: 34.5 g/dL (ref 30.0–36.0)
MCV: 102.1 fL — ABNORMAL HIGH (ref 80.0–100.0)
Monocytes Absolute: 0.3 10*3/uL (ref 0.1–1.0)
Monocytes Relative: 14 %
Neutro Abs: 1.1 10*3/uL — ABNORMAL LOW (ref 1.7–7.7)
Neutrophils Relative %: 59 %
Platelet Count: 95 10*3/uL — ABNORMAL LOW (ref 150–400)
RBC: 4.23 MIL/uL (ref 3.87–5.11)
RDW: 14.5 % (ref 11.5–15.5)
WBC Count: 1.9 10*3/uL — ABNORMAL LOW (ref 4.0–10.5)
nRBC: 0 % (ref 0.0–0.2)

## 2022-08-08 LAB — TOTAL PROTEIN, URINE DIPSTICK: Protein, ur: 100 mg/dL — AB

## 2022-08-08 MED ORDER — LOMUSTINE 10 MG PO CAPS
10.0000 mg | ORAL_CAPSULE | Freq: Once | ORAL | 0 refills | Status: AC
  Filled 2022-08-08: qty 1, 1d supply, fill #0

## 2022-08-08 MED ORDER — LOMUSTINE 100 MG PO CAPS
100.0000 mg | ORAL_CAPSULE | Freq: Once | ORAL | 0 refills | Status: AC
  Filled 2022-08-08: qty 1, 30d supply, fill #0

## 2022-08-08 MED ORDER — SODIUM CHLORIDE 0.9 % IV SOLN: Freq: Once | INTRAVENOUS | Status: AC

## 2022-08-08 MED ORDER — SODIUM CHLORIDE 0.9 % IV SOLN
600.0000 mg | Freq: Once | INTRAVENOUS | Status: AC
  Administered 2022-08-08: 600 mg via INTRAVENOUS
  Filled 2022-08-08: qty 16

## 2022-08-08 MED ORDER — AMLODIPINE BESYLATE 5 MG PO TABS: 5.0000 mg | ORAL_TABLET | Freq: Every day | ORAL | 3 refills | Status: DC

## 2022-08-08 MED ORDER — LOMUSTINE 40 MG PO CAPS
40.0000 mg | ORAL_CAPSULE | Freq: Once | ORAL | 0 refills | Status: AC
  Filled 2022-08-08: qty 1, 30d supply, fill #0

## 2022-08-08 NOTE — Addendum Note (Signed)
Addended by: [name] on: 08/08/2022 11:15 AM   Modules accepted: Orders

## 2022-08-08 NOTE — Progress Notes (Signed)
[facility] [place], [place] [address] [phone]   Interval Evaluation  Date of Service: 08/08/22 Patient Name: Hannah [name] Patient MRN: [id number] Patient DOB: 26-Dec-1960 Provider: [name], MD  Identifying Statement:  Hannah [name] is a 61 y.o. female with left frontal glioblastoma   Oncologic History: Oncology History  GBM (glioblastoma multiforme) ([facility])   Initial Diagnosis   GBM (glioblastoma multiforme) ([facility])   02/17/2020 Surgery   Craniotomy, left frontal resection by Dr. Marcello Moores.  Path is glioblastoma.   08/28/2020 - 10/09/2020 Radiation Therapy   Radiation and concurrent Temozolomide 64m/m2 daily   11/10/2020 - 12/09/2020 Chemotherapy   Completes 1 cycle of 5-day Temozolomide      [facility]   Admitted for saddle PE, undergoes thrombectomy and IVC filter placement   03/09/2021 Progression     03/27/2021 -  Chemotherapy   Initiates second line therapy with Avastin 180mkg IV q2 weeks    08/08/2021 - 08/09/2021 Chemotherapy   Patient is on Treatment Plan : BRAIN GLIOBLASTOMA Consolidation Temozolomide Days 1-5 q28 Days      12/03/2021 - 05/13/2022 Chemotherapy   Patient is on Treatment Plan : BRAIN Lomustine q42d     08/08/2022 -  Chemotherapy   Patient is on Treatment Plan : BRAIN Lomustine q42d       Biomarkers:  MGMT Methylated.  IDH 1/2 Wild type.  EGFR Unknown  TERT Unknown   Interval History: Hannah [name] presents today for avastin infusion.  She is now 6 weeks s/p dosing cycle 4 of CCNU.  Vimpat currently at 5054mID.  Leg swelling is persistent.  No significant changes with ongoing fatigue, lethargy, continues reliance on family for functional support.  No other new or progressive complaints today.    Decadron 11/09/20: 2mg54m/10/22: 8mg 32m21/22: 6mg 066m7/22: 4mg 0369m/22: 8mg 04/31m22: 6mg 05/149m2: 3mg 05/2358m: 2mg 06/06/74m 1.5mg 06/20/273m -  H+P (01/20/20) Patient presented one month ago with sudden onset left arm and leg weakness and interrupted speech, [name], [name], stroke eval and CNS imaging which demonstrated a non-enhancing left frontal mass.  At this time she is back to baseline without recurrence of events, taking Keppra 500mg twice p32may.  No history or seizure or any other neurologic events.  She does describe being sleep deprived prior to seizure event.  She presents today after one month follow up MRI scan.  Medications: Current Outpatient Medications on File Prior to Visit  Medication Sig Dispense Refill   amLODipine (NORVASC) 5 MG tablet TAKE 1 TABLET (5 MG TOTAL) BY MOUTH DAILY. 30 tablet 3   apixaban (ELIQUIS) 5 MG TABS tablet TAKE 1 TABLET (5 MG TOTAL) BY MOUTH 2 (TWO) TIMES DAILY. 180 tablet 1   Baclofen 5 MG TABS Take 1 tablet (5 mg) by mouth 3 (three) times daily. 90 tablet 3   cholecalciferol (VITAMIN D3) 25 MCG (1000 UNIT) tablet Take 1,000 Units by mouth daily.     docusate sodium (COLACE) 100 MG capsule Take 100 mg by mouth 2 (two) times daily as needed for mild constipation.     Lacosamide 100 MG TABS Take 1 tablet (100 mg total) by mouth in the morning and at bedtime. 90 tablet 1   [DISCONTINUED] lamoTRIgine (LAMICTAL) 100 MG tablet Take 1 tablet (  100 mg total) by mouth daily. 30 tablet 3   No current facility-administered medications on file prior to visit.    Allergies: No Known Allergies Past Medical History:  Past Medical History:  Diagnosis Date   Cervical spondylolysis 12/15/2019   Skeleton: Mild cervical spondylosis C6-7. Incidental find.    Colon polyp    GBM (glioblastoma multiforme) (HCC)    Left ankle sprain 10/07/2011   Seizure ([facility])    partial    Tick bite 02/2020   Past Surgical History:  Past Surgical History:  Procedure Laterality Date   APPLICATION  OF CRANIAL NAVIGATION Left 02/16/2020   Procedure: APPLICATION OF CRANIAL NAVIGATION;  Surgeon: Vallarie Mare, MD;  Location: [facility];  Service: Neurosurgery;  Laterality: Left;   CRANIOTOMY Left 02/16/2020   Procedure: LEFT FRONTAL CRANIOTOMY FOR BRAIN TUMOR;  Surgeon: Vallarie Mare, MD;  Location: [facility];  Service: Neurosurgery;  Laterality: Left;   FOOT SURGERY Left    bone spur - local anesthesia   IR ANGIOGRAM PULMONARY BILATERAL SELECTIVE  12/15/2020   IR ANGIOGRAM SELECTIVE EACH ADDITIONAL VESSEL  12/15/2020   IR ANGIOGRAM SELECTIVE EACH ADDITIONAL VESSEL  12/15/2020   IR IVC FILTER PLMT / S&I /IMG GUID/MOD SED  12/15/2020   IR THROMBECT PRIM MECH INIT (INCLU) MOD SED  12/15/2020   IR THROMBECT PRIM MECH INIT (INCLU) MOD SED  12/15/2020   RADIOLOGY WITH ANESTHESIA N/A 12/15/2020   Procedure: IR WITH ANESTHESIA;  Surgeon: Radiologist, Medication, MD;  Location: [facility];  Service: Radiology;  Laterality: N/A;   TONSILECTOMY/ADENOIDECTOMY WITH MYRINGOTOMY     tonsils and adenoids out only   WISDOM TOOTH EXTRACTION     Social History:  Social History   Socioeconomic History   Marital status: Married    Spouse name: Not on file   Number of children: 3   Years of education: Not on file   Highest education level: Not on file  Occupational History   Not on file  Tobacco Use   Smoking status: Former    Years: 2.00    Types: Cigarettes    Quit date: 11/05/1979    Years since quitting: 42.7   Smokeless tobacco: Never   Tobacco comments:    1/2 pack a week  Vaping Use   Vaping Use: Never used  Substance and Sexual Activity   Alcohol use: Yes    Alcohol/week: 0.0 standard drinks of alcohol    Comment: 1 glass of wine weekly   Drug use: No   Sexual activity: Yes    Birth control/protection: Surgical  Other Topics Concern   Not on file  Social History Narrative   Not on file   Social Determinants of Health   Financial Resource Strain: Low Risk  (07/19/2022)   Overall Financial  Resource Strain (CARDIA)    Difficulty of Paying Living Expenses: Not very hard  Food Insecurity: No Food Insecurity (07/19/2022)   Hunger Vital Sign    Worried About Running Out of Food in the Last Year: Never true    Ran Out of Food in the Last Year: Never true  Transportation Needs: No Transportation Needs (07/19/2022)   PRAPARE - Hydrologist (Medical): No    Lack of Transportation (Non-Medical): No  Physical Activity: Not on file  Stress: Not on file  Social Connections: Not on file  Intimate Partner Violence: Not At Risk (07/19/2022)   Humiliation, Afraid, Rape, and Kick questionnaire    Fear of Current  or Ex-Partner: No    Emotionally Abused: No    Physically Abused: No    Sexually Abused: No   Family History:  Family History  Problem Relation Age of Onset   Hypertension Mother    Stroke Mother    Diabetes Mother    Colon cancer Father 64   Hypertension Sister    Colon polyps Sister    Heart disease Brother 91   Diabetes Maternal Uncle    Diabetes Maternal Uncle     Review of Systems: Constitutional: Doesn't report fevers, chills or abnormal weight loss Eyes: Doesn't report blurriness of vision Ears, nose, mouth, throat, and face: Doesn't report sore throat Respiratory: Chest wall pain Cardiovascular: Doesn't report palpitation, chest discomfort  Gastrointestinal:  Doesn't report nausea, constipation, diarrhea GU: Doesn't report incontinence Skin: Doesn't report skin rashes Neurological: Per HPI Musculoskeletal: normal Behavioral/Psych: Doesn't report anxiety  Physical Exam: There were no vitals filed for this visit.  KPS: 60. General: Alert, cooperative, pleasant, in no acute distress Head: Normal EENT: No conjunctival injection or scleral icterus.  Lungs: Resp effort normal Cardiac: Regular rate Abdomen: Non-distended abdomen Skin: No rashes cyanosis or petechiae. Extremities: No clubbing or edema  Neurologic Exam: Mental  Status: Awake, alert, attentive to examiner. Both receptive and expressive dysphasia.  Cranial Nerves: Visual acuity is grossly normal. Visual fields are full. Extra-ocular movements intact. No ptosis. Face is symmetric Motor: Tone and bulk are normal. R arm 1/5. Right leg 3/5, left leg 4/5, proximal greater than distal weakness.  Noted R leg flexor spasticity. Reflexes are symmetric, no pathologic reflexes present.  Sensory: Intact to light touch Gait: Deferred today  Labs: I have reviewed the data as listed    Component Value Date/Time   NA 141 07/18/2022 1019   K 3.7 07/18/2022 1019   CL 109 07/18/2022 1019   CO2 22 07/18/2022 1019   GLUCOSE 109 (H) 07/18/2022 1019   GLUCOSE 85 10/13/2006 1158   BUN 20 07/18/2022 1019   CREATININE 0.57 07/18/2022 1019   CALCIUM 9.5 07/18/2022 1019   PROT 6.8 07/18/2022 1019   ALBUMIN 3.6 07/18/2022 1019   AST 54 (H) 07/18/2022 1019   ALT 81 (H) 07/18/2022 1019   ALKPHOS 92 07/18/2022 1019   BILITOT 0.7 07/18/2022 1019   GFRNONAA >60 07/18/2022 1019   GFRAA >60 02/17/2020 0136   Lab Results  Component Value Date   WBC 2.6 (L) 07/18/2022   NEUTROABS 1.9 07/18/2022   HGB 15.6 (H) 07/18/2022   HCT 45.8 07/18/2022   MCV 102.9 (H) 07/18/2022   PLT 57 (L) 07/18/2022    Assessment/Plan GBM (glioblastoma multiforme) (HCC)  Seizure disorder (HCC)  Hannah [name] is clinically stable today.  Labs today demonstrate thrombocytopenia, neutropenia.  Pending improvement in cytopenias, we recommended she proceed with cycle #5 CCNU 36m/m2 q6 week with Avastin 132mkg IV q3 weeks.  We will administer udenyca for bone marrow support 24h following dosing of CCNU.  We will proceed with avastin today.  CCNU should be dosed at home in 3 weeks (08/28/22).  She will return for avastin infusion on 10/26, we will additionally administer udenyca at that time.  Chemotherapy should be held for the following:  ANC less than 1,000  Platelets less than  100,000  LFT or creatinine greater than 2x ULN  If clinical concerns/contraindications develop  Avastin should be held for the following:  ANC less than 500  Platelets less than 50,000  LFT or creatinine greater than 2x ULN  If clinical concerns/contraindications develop  Will con't Norvasc 69m daily.  Could consider switch to ACE/ARB if leg edema persists or worsens.  Vimpat may con't 530mBID if preferred; con't Eliquis as prior.   Baclofen con't 1m7m8 pRN.  We ask that Carrianne A Scheer return to clinic in 3 weeks for avastin+udenyca.  Next MRI can be following cycle #5.  All questions were answered. The patient knows to call the clinic with any problems, questions or concerns. No barriers to learning were detected.  I have spent a total of 30 minutes of face-to-face and non-face-to-face time, excluding clinical staff time, preparing to see patient, ordering tests and/or medications, counseling the patient, and independently interpreting results and communicating results to the patient/family/caregiver    ZacVentura SellersD Medical Director of Neuro-Oncology [facility] [place]/05/23 10:33 AM

## 2022-08-08 NOTE — Progress Notes (Signed)
Patient is approved by Dr Mickeal Skinner to receive Avastin today 08/08/2022 with current vitals and lab values.

## 2022-08-10 ENCOUNTER — Other Ambulatory Visit: Payer: Self-pay

## 2022-08-11 ENCOUNTER — Other Ambulatory Visit: Payer: Self-pay

## 2022-08-12 ENCOUNTER — Other Ambulatory Visit: Payer: Self-pay

## 2022-08-20 ENCOUNTER — Other Ambulatory Visit (HOSPITAL_COMMUNITY): Payer: Self-pay

## 2022-08-29 ENCOUNTER — Inpatient Hospital Stay: Payer: BC Managed Care – PPO | Admitting: Internal Medicine

## 2022-08-29 ENCOUNTER — Telehealth: Payer: Self-pay | Admitting: Internal Medicine

## 2022-08-29 ENCOUNTER — Inpatient Hospital Stay: Payer: BC Managed Care – PPO

## 2022-08-29 ENCOUNTER — Other Ambulatory Visit: Payer: Self-pay

## 2022-08-29 VITALS — BP 136/96 | HR 86 | Temp 97.8°F | Resp 17 | Ht 65.0 in

## 2022-08-29 VITALS — BP 109/76 | HR 74

## 2022-08-29 DIAGNOSIS — C719 Malignant neoplasm of brain, unspecified: Secondary | ICD-10-CM | POA: Diagnosis not present

## 2022-08-29 DIAGNOSIS — G40909 Epilepsy, unspecified, not intractable, without status epilepticus: Secondary | ICD-10-CM | POA: Diagnosis not present

## 2022-08-29 DIAGNOSIS — C711 Malignant neoplasm of frontal lobe: Secondary | ICD-10-CM | POA: Diagnosis not present

## 2022-08-29 DIAGNOSIS — D702 Other drug-induced agranulocytosis: Secondary | ICD-10-CM

## 2022-08-29 LAB — CBC WITH DIFFERENTIAL (CANCER CENTER ONLY)
Abs Immature Granulocytes: 0 10*3/uL (ref 0.00–0.07)
Basophils Absolute: 0 10*3/uL (ref 0.0–0.1)
Basophils Relative: 1 %
Eosinophils Absolute: 0.1 10*3/uL (ref 0.0–0.5)
Eosinophils Relative: 3 %
HCT: 43.2 % (ref 36.0–46.0)
Hemoglobin: 14.6 g/dL (ref 12.0–15.0)
Immature Granulocytes: 0 %
Lymphocytes Relative: 12 %
Lymphs Abs: 0.3 10*3/uL — ABNORMAL LOW (ref 0.7–4.0)
MCH: 35.4 pg — ABNORMAL HIGH (ref 26.0–34.0)
MCHC: 33.8 g/dL (ref 30.0–36.0)
MCV: 104.9 fL — ABNORMAL HIGH (ref 80.0–100.0)
Monocytes Absolute: 0.4 10*3/uL (ref 0.1–1.0)
Monocytes Relative: 14 %
Neutro Abs: 1.9 10*3/uL (ref 1.7–7.7)
Neutrophils Relative %: 70 %
Platelet Count: 115 10*3/uL — ABNORMAL LOW (ref 150–400)
RBC: 4.12 MIL/uL (ref 3.87–5.11)
RDW: 14.6 % (ref 11.5–15.5)
WBC Count: 2.6 10*3/uL — ABNORMAL LOW (ref 4.0–10.5)
nRBC: 0 % (ref 0.0–0.2)

## 2022-08-29 LAB — CMP (CANCER CENTER ONLY)
ALT: 134 U/L — ABNORMAL HIGH (ref 0–44)
AST: 115 U/L — ABNORMAL HIGH (ref 15–41)
Albumin: 3.7 g/dL (ref 3.5–5.0)
Alkaline Phosphatase: 139 U/L — ABNORMAL HIGH (ref 38–126)
Anion gap: 9 (ref 5–15)
BUN: 23 mg/dL (ref 8–23)
CO2: 24 mmol/L (ref 22–32)
Calcium: 9.4 mg/dL (ref 8.9–10.3)
Chloride: 112 mmol/L — ABNORMAL HIGH (ref 98–111)
Creatinine: 0.67 mg/dL (ref 0.44–1.00)
GFR, Estimated: >60 mL/min (ref >60)
Glucose, Bld: 135 mg/dL — ABNORMAL HIGH (ref 70–99)
Potassium: 3.7 mmol/L (ref 3.5–5.1)
Sodium: 145 mmol/L (ref 135–145)
Total Bilirubin: 0.5 mg/dL (ref 0.3–1.2)
Total Protein: 6.6 g/dL (ref 6.5–8.1)

## 2022-08-29 MED ORDER — SODIUM CHLORIDE 0.9 % IV SOLN: Freq: Once | INTRAVENOUS | Status: AC

## 2022-08-29 MED ORDER — PEGFILGRASTIM-CBQV 6 MG/0.6ML ~~LOC~~ SOSY
6.0000 mg | PREFILLED_SYRINGE | Freq: Once | SUBCUTANEOUS | Status: AC
  Administered 2022-08-29: 6 mg via SUBCUTANEOUS
  Filled 2022-08-29: qty 0.6

## 2022-08-29 MED ORDER — SODIUM CHLORIDE 0.9 % IV SOLN
600.0000 mg | Freq: Once | INTRAVENOUS | Status: AC
  Administered 2022-08-29: 600 mg via INTRAVENOUS
  Filled 2022-08-29: qty 16

## 2022-08-29 NOTE — Progress Notes (Signed)
Ok to treat today 08/29/2022 without urine specimen per Dr Mickeal Skinner.

## 2022-08-29 NOTE — Telephone Encounter (Signed)
10/26 los called and spoke to pt husband about appointment

## 2022-08-29 NOTE — Patient Instructions (Signed)
Pegfilgrastim Injection What is this medication? PEGFILGRASTIM (PEG fil gra stim) lowers the risk of infection in people who are receiving chemotherapy. It works by helping your body make more white blood cells, which protects your body from infection. It may also be used to help people who have been exposed to high doses of radiation. This medicine may be used for other purposes; ask your health care provider or pharmacist if you have questions. COMMON BRAND NAME(S): Fulphila, Fylnetra, Neulasta, Nyvepria, Stimufend, UDENYCA, Ziextenzo What should I tell my care team before I take this medication? They need to know if you have any of these conditions: Kidney disease Latex allergy Ongoing radiation therapy Sickle cell disease Skin reactions to acrylic adhesives (On-Body Injector only) An unusual or allergic reaction to pegfilgrastim, filgrastim, other medications, foods, dyes, or preservatives Pregnant or trying to get pregnant Breast-feeding How should I use this medication? This medication is for injection under the skin. If you get this medication at home, you will be taught how to prepare and give the pre-filled syringe or how to use the On-body Injector. Refer to the patient Instructions for Use for detailed instructions. Use exactly as directed. Tell your care team immediately if you suspect that the On-body Injector may not have performed as intended or if you suspect the use of the On-body Injector resulted in a missed or partial dose. It is important that you put your used needles and syringes in a special sharps container. Do not put them in a trash can. If you do not have a sharps container, call your pharmacist or care team to get one. Talk to your care team about the use of this medication in children. While this medication may be prescribed for selected conditions, precautions do apply. Overdosage: If you think you have taken too much of this medicine contact a poison control center  or emergency room at once. NOTE: This medicine is only for you. Do not share this medicine with others. What if I miss a dose? It is important not to miss your dose. Call your care team if you miss your dose. If you miss a dose due to an On-body Injector failure or leakage, a new dose should be administered as soon as possible using a single prefilled syringe for manual use. What may interact with this medication? Interactions have not been studied. This list may not describe all possible interactions. Give your health care provider a list of all the medicines, herbs, non-prescription drugs, or dietary supplements you use. Also tell them if you smoke, drink alcohol, or use illegal drugs. Some items may interact with your medicine. What should I watch for while using this medication? Your condition will be monitored carefully while you are receiving this medication. You may need blood work done while you are taking this medication. Talk to your care team about your risk of cancer. You may be more at risk for certain types of cancer if you take this medication. If you are going to need a MRI, CT scan, or other procedure, tell your care team that you are using this medication (On-Body Injector only). What side effects may I notice from receiving this medication? Side effects that you should report to your care team as soon as possible: Allergic reactions--skin rash, itching, hives, swelling of the face, lips, tongue, or throat Capillary leak syndrome--stomach or muscle pain, unusual weakness or fatigue, feeling faint or lightheaded, decrease in the amount of urine, swelling of the ankles, hands, or feet, trouble   breathing High white blood cell level--fever, fatigue, trouble breathing, night sweats, change in vision, weight loss Inflammation of the aorta--fever, fatigue, back, chest, or stomach pain, severe headache Kidney injury (glomerulonephritis)--decrease in the amount of urine, red or dark brown  urine, foamy or bubbly urine, swelling of the ankles, hands, or feet Shortness of breath or trouble breathing Spleen injury--pain in upper left stomach or shoulder Unusual bruising or bleeding Side effects that usually do not require medical attention (report to your care team if they continue or are bothersome): Bone pain Pain in the hands or feet This list may not describe all possible side effects. Call your doctor for medical advice about side effects. You may report side effects to FDA at 1-800-FDA-1088. Where should I keep my medication? Keep out of the reach of children. If you are using this medication at home, you will be instructed on how to store it. Throw away any unused medication after the expiration date on the label. NOTE: This sheet is a summary. It may not cover all possible information. If you have questions about this medicine, talk to your doctor, pharmacist, or health care provider.  2023 Elsevier/Gold Standard (2021-05-10 00:00:00)  [facility]  Discharge Instructions: Thank you for choosing [facility] to provide your oncology and hematology care.   If you have a lab appointment with the [facility], please go directly to the [facility] and check in at the registration area.   Wear comfortable clothing and clothing appropriate for easy access to any Portacath or PICC line.   We strive to give you quality time with your provider. You may need to reschedule your appointment if you arrive late (15 or more minutes).  Arriving late affects you and other patients whose appointments are after yours.  Also, if you miss three or more appointments without notifying the office, you may be dismissed from the clinic at the provider's discretion.      For prescription refill requests, have your pharmacy contact our office and allow 72 hours for refills to be completed.    Today you received the following chemotherapy and/or  immunotherapy agents: Avastin.      To help prevent nausea and vomiting after your treatment, we encourage you to take your nausea medication as directed.  BELOW ARE SYMPTOMS THAT SHOULD BE REPORTED IMMEDIATELY: *FEVER GREATER THAN 100.4 F (38 C) OR HIGHER *CHILLS OR SWEATING *NAUSEA AND VOMITING THAT IS NOT CONTROLLED WITH YOUR NAUSEA MEDICATION *UNUSUAL SHORTNESS OF BREATH *UNUSUAL BRUISING OR BLEEDING *URINARY PROBLEMS (pain or burning when urinating, or frequent urination) *BOWEL PROBLEMS (unusual diarrhea, constipation, pain near the anus) TENDERNESS IN MOUTH AND THROAT WITH OR WITHOUT PRESENCE OF ULCERS (sore throat, sores in mouth, or a toothache) UNUSUAL RASH, SWELLING OR PAIN  UNUSUAL VAGINAL DISCHARGE OR ITCHING   Items with * indicate a potential emergency and should be followed up as soon as possible or go to the Emergency Department if any problems should occur.  Please show the CHEMOTHERAPY ALERT CARD or IMMUNOTHERAPY ALERT CARD at check-in to the Emergency Department and triage nurse.  Should you have questions after your visit or need to cancel or reschedule your appointment, please contact [facility]  Dept: [phone]  and follow the prompts.  Office hours are 8:00 a.m. to 4:30 p.m. Monday - Friday. Please note that voicemails left after 4:00 p.m. may not be returned until the following business day.  We are closed  weekends and major holidays. You have access to a nurse at all times for urgent questions. Please call the main number to the clinic Dept: [phone] and follow the prompts.   For any non-urgent questions, you may also contact your provider using MyChart. We now offer e-Visits for anyone 75 and older to request care online for non-urgent symptoms. For details visit [email or web address].   Also download the MyChart app! Go to the app store, search "MyChart", open the app, select [facility], and log in with your MyChart  username and password.  Masks are optional in the cancer centers. If you would like for your care team to wear a mask while they are taking care of you, please let them know. You may have one support person who is at least 61 years old accompany you for your appointments.

## 2022-08-29 NOTE — Progress Notes (Signed)
Per MD, ok to proceed with Mvasi today without urine protein results, and elevated LFTs.

## 2022-08-29 NOTE — Progress Notes (Signed)
[facility] at [place], [place] [address] [phone]   Interval Evaluation  Date of Service: 08/29/22 Patient Name: Hannah [name] Patient MRN: [id number] Patient DOB: 08/22/1961 Provider: [name], MD  Identifying Statement:  Hannah [name] is a 61 y.o. female with left frontal glioblastoma   Oncologic History: Oncology History  GBM (glioblastoma multiforme) ([place])   Initial Diagnosis   GBM (glioblastoma multiforme) ([facility])   02/17/2020 Surgery   Craniotomy, left frontal resection by Dr. Marcello Moores.  Path is glioblastoma.   08/28/2020 - 10/09/2020 Radiation Therapy   Radiation and concurrent Temozolomide 61m/m2 daily   11/10/2020 - 12/09/2020 Chemotherapy   Completes 1 cycle of 5-day Temozolomide      [facility]   Ad[facility] for [facility], undergoes thrombectomy and IVC filter placement   03/09/2021 Progression     03/27/2021 -  Chemotherapy   Initiates second line therapy with Avastin 152mkg IV q2 weeks    08/08/2021 - 08/09/2021 Chemotherapy   Patient is on Treatment Plan : BRAIN GLIOBLASTOMA Consolidation Temozolomide Days 1-5 q28 Days      12/03/2021 - 05/13/2022 Chemotherapy   Patient is on Treatment Plan : BRAIN Lomustine q42d     08/08/2022 -  Chemotherapy   Patient is on Treatment Plan : BRAIN Lomustine q42d       Biomarkers:  MGMT Methylated.  IDH 1/2 Wild type.  EGFR Unknown  TERT Unknown   Interval History: Hannah [name] presents today for avastin infusion.  Cycle 5 of CCNU was dosed without issue on 08/28/22.  Vimpat remains at 5019mwice per day.  Husband has noticed a little more difficulty with transition to supported standing this week.  On Monday, she was very lethargic and not interactive, this improved after one day.  Leg edema is improved.  Continues reliance on family for functional support.  No other new or progressive complaints today.    Decadron 11/09/20:  2mg104m/10/22: 8mg 69m21/22: 6mg 028m7/22: 4mg 0322m/22: 8mg 04/59m22: 6mg 05/120m2: 3mg 05/2376m: 2mg 06/06/66m 1.5mg 06/20/28m-  H+P (01/20/20) Patient presented one month ago with sudden onset left arm and leg weakness and interrupted speech, [name], [name], stroke eval and CNS imaging which demonstrated a non-enhancing left frontal mass.  At this time she is back to baseline without recurrence of events, taking Keppra 500mg twice p38may.  No history or seizure or any other neurologic events.  She does describe being sleep deprived prior to seizure event.  She presents today after one month follow up MRI scan.  Medications: Current Outpatient Medications on File Prior to Visit  Medication Sig Dispense Refill   amLODipine (NORVASC) 5 MG tablet Take 1 tablet (5 mg total) by mouth daily. 30 tablet 3   apixaban (ELIQUIS) 5 MG TABS tablet TAKE 1 TABLET (5 MG TOTAL) BY MOUTH 2 (TWO) TIMES DAILY. 180 tablet 1   Baclofen 5 MG TABS Take 1 tablet (5 mg) by mouth 3 (three) times daily. 90 tablet 3   cholecalciferol (VITAMIN D3) 25 MCG (1000 UNIT) tablet Take 1,000 Units by mouth daily.     docusate sodium (COLACE) 100 MG capsule Take 100 mg by mouth 2 (two) times daily as needed for mild constipation.     Lacosamide 100 MG TABS Take 1 tablet (100  mg total) by mouth in the morning and at bedtime. 90 tablet 1   [DISCONTINUED] lamoTRIgine (LAMICTAL) 100 MG tablet Take 1 tablet (100 mg total) by mouth daily. 30 tablet 3   No current facility-administered medications on file prior to visit.    Allergies: No Known Allergies Past Medical History:  Past Medical History:  Diagnosis Date   Cervical spondylolysis 12/15/2019   Skeleton: Mild cervical spondylosis C6-7. Incidental find.    Colon polyp    GBM (glioblastoma multiforme) ([facility])    Left ankle sprain 10/07/2011   Seizure  ([facility])    partial    Tick bite 02/2020   Past Surgical History:  Past Surgical History:  Procedure Laterality Date   APPLICATION OF CRANIAL NAVIGATION Left 02/16/2020   Procedure: APPLICATION OF CRANIAL NAVIGATION;  Surgeon: Vallarie Mare, MD;  Location: [facility];  Service: Neurosurgery;  Laterality: Left;   CRANIOTOMY Left 02/16/2020   Procedure: LEFT FRONTAL CRANIOTOMY FOR BRAIN TUMOR;  Surgeon: Vallarie Mare, MD;  Location: [facility];  Service: Neurosurgery;  Laterality: Left;   FOOT SURGERY Left    bone spur - local anesthesia   IR ANGIOGRAM PULMONARY BILATERAL SELECTIVE  12/15/2020   IR ANGIOGRAM SELECTIVE EACH ADDITIONAL VESSEL  12/15/2020   IR ANGIOGRAM SELECTIVE EACH ADDITIONAL VESSEL  12/15/2020   IR IVC FILTER PLMT / S&I /IMG GUID/MOD SED  12/15/2020   IR THROMBECT PRIM MECH INIT (INCLU) MOD SED  12/15/2020   IR THROMBECT PRIM MECH INIT (INCLU) MOD SED  12/15/2020   RADIOLOGY WITH ANESTHESIA N/A 12/15/2020   Procedure: IR WITH ANESTHESIA;  Surgeon: Radiologist, Medication, MD;  Location: [facility];  Service: Radiology;  Laterality: N/A;   TONSILECTOMY/ADENOIDECTOMY WITH MYRINGOTOMY     tonsils and adenoids out only   WISDOM TOOTH EXTRACTION     Social History:  Social History   Socioeconomic History   Marital status: Married    Spouse name: Not on file   Number of children: 3   Years of education: Not on file   Highest education level: Not on file  Occupational History   Not on file  Tobacco Use   Smoking status: Former    Years: 2.00    Types: Cigarettes    Quit date: 11/05/1979    Years since quitting: 42.8   Smokeless tobacco: Never   Tobacco comments:    1/2 pack a week  Vaping Use   Vaping Use: Never used  Substance and Sexual Activity   Alcohol use: Yes    Alcohol/week: 0.0 standard drinks of alcohol    Comment: 1 glass of wine weekly   Drug use: No   Sexual activity: Yes    Birth control/protection: Surgical  Other Topics Concern   Not on file  Social  History Narrative   Not on file   Social Determinants of Health   Financial Resource Strain: Low Risk  (07/19/2022)   Overall Financial Resource Strain (CARDIA)    Difficulty of Paying Living Expenses: Not very hard  Food Insecurity: No Food Insecurity (07/19/2022)   Hunger Vital Sign    Worried About Running Out of Food in the Last Year: Never true    Ran Out of Food in the Last Year: Never true  Transportation Needs: No Transportation Needs (07/19/2022)   PRAPARE - Hydrologist (Medical): No    Lack of Transportation (Non-Medical): No  Physical Activity: Not on file  Stress: Not on file  Social Connections: Not  on file  Intimate Partner Violence: Not At Risk (07/19/2022)   Humiliation, Afraid, Rape, and Kick questionnaire    Fear of Current or Ex-Partner: No    Emotionally Abused: No    Physically Abused: No    Sexually Abused: No   Family History:  Family History  Problem Relation Age of Onset   Hypertension Mother    Stroke Mother    Diabetes Mother    Colon cancer Father 51   Hypertension Sister    Colon polyps Sister    Heart disease Brother 43   Diabetes Maternal Uncle    Diabetes Maternal Uncle     Review of Systems: Constitutional: Doesn't report fevers, chills or abnormal weight loss Eyes: Doesn't report blurriness of vision Ears, nose, mouth, throat, and face: Doesn't report sore throat Respiratory: Chest wall pain Cardiovascular: Doesn't report palpitation, chest discomfort  Gastrointestinal:  Doesn't report nausea, constipation, diarrhea GU: Doesn't report incontinence Skin: Doesn't report skin rashes Neurological: Per HPI Musculoskeletal: normal Behavioral/Psych: Doesn't report anxiety  Physical Exam: Vitals:   08/29/22 1044  BP: (!) 136/96  Pulse: 86  Resp: 17  Temp: 97.8 F (36.6 C)  SpO2: 96%    KPS: 60. General: Alert, cooperative, pleasant, in no acute distress Head: Normal EENT: No conjunctival injection or  scleral icterus.  Lungs: Resp effort normal Cardiac: Regular rate Abdomen: Non-distended abdomen Skin: No rashes cyanosis or petechiae. Extremities: No clubbing or edema  Neurologic Exam: Mental Status: Awake, alert, attentive to examiner. Both receptive and expressive dysphasia.  Cranial Nerves: Visual acuity is grossly normal. Visual fields are full. Extra-ocular movements intact. No ptosis. Face is symmetric Motor: Tone and bulk are normal. R arm 1/5. Right leg 3/5, left leg 4/5, proximal greater than distal weakness.  Noted R leg flexor spasticity. Reflexes are symmetric, no pathologic reflexes present.  Sensory: Intact to light touch Gait: Deferred today  Labs: I have reviewed the data as listed    Component Value Date/Time   NA 141 08/08/2022 1011   K 3.7 08/08/2022 1011   CL 108 08/08/2022 1011   CO2 25 08/08/2022 1011   GLUCOSE 121 (H) 08/08/2022 1011   GLUCOSE 85 10/13/2006 1158   BUN 22 08/08/2022 1011   CREATININE 0.68 08/08/2022 1011   CALCIUM 9.3 08/08/2022 1011   PROT 7.1 08/08/2022 1011   ALBUMIN 4.0 08/08/2022 1011   AST 45 (H) 08/08/2022 1011   ALT 57 (H) 08/08/2022 1011   ALKPHOS 106 08/08/2022 1011   BILITOT 0.5 08/08/2022 1011   GFRNONAA >60 08/08/2022 1011   GFRAA >60 02/17/2020 0136   Lab Results  Component Value Date   WBC 2.6 (L) 08/29/2022   NEUTROABS 1.9 08/29/2022   HGB 14.6 08/29/2022   HCT 43.2 08/29/2022   MCV 104.9 (H) 08/29/2022   PLT 115 (L) 08/29/2022    Assessment/Plan GBM (glioblastoma multiforme) (HCC)  Seizure disorder (HCC)  Hannah [name] is clinically stable today, now day 2 of cycle #5 CCNU/avastin.  Labs are improved today, thrombocytopenia resolved.  She will con't with cycle #5 CCNU 3m/m2 q6 week with Avastin 155mkg IV q3 weeks.  We will administer udenyca for bone marrow support today.  Chemotherapy should be held for the following:  ANC less than 1,000  Platelets less than 100,000  LFT or creatinine  greater than 2x ULN  If clinical concerns/contraindications develop  Avastin should be held for the following:  ANC less than 500  Platelets less than 50,000  LFT or creatinine  greater than 2x ULN  If clinical concerns/contraindications develop  Will con't Norvasc 70m daily.  Could consider switch to ACE/ARB if leg edema persists or worsens.  Vimpat may con't 520mBID; con't Eliquis as prior.   Baclofen con't 69m83m8 pRN.  We ask that Junia A Forgione return to clinic in 3 weeks for avastin.  Next MRI can be following cycle #5.  All questions were answered. The patient knows to call the clinic with any problems, questions or concerns. No barriers to learning were detected.  I have spent a total of 30 minutes of face-to-face and non-face-to-face time, excluding clinical staff time, preparing to see patient, ordering tests and/or medications, counseling the patient, and independently interpreting results and communicating results to the patient/family/caregiver    ZacVentura SellersD Medical Director of Neuro-Oncology [facility]/26/23 10:45 AM

## 2022-08-30 ENCOUNTER — Other Ambulatory Visit: Payer: Self-pay

## 2022-09-01 ENCOUNTER — Other Ambulatory Visit: Payer: Self-pay | Admitting: Internal Medicine

## 2022-09-02 ENCOUNTER — Encounter: Payer: Self-pay | Admitting: Internal Medicine

## 2022-09-04 ENCOUNTER — Other Ambulatory Visit: Payer: Self-pay

## 2022-09-12 ENCOUNTER — Other Ambulatory Visit: Payer: Self-pay

## 2022-09-19 ENCOUNTER — Inpatient Hospital Stay: Payer: BC Managed Care – PPO | Admitting: Internal Medicine

## 2022-09-19 ENCOUNTER — Inpatient Hospital Stay: Payer: BC Managed Care – PPO

## 2022-09-19 ENCOUNTER — Inpatient Hospital Stay: Payer: BC Managed Care – PPO | Attending: Internal Medicine

## 2022-09-19 ENCOUNTER — Other Ambulatory Visit: Payer: Self-pay

## 2022-09-19 VITALS — BP 139/98 | HR 85 | Temp 97.7°F | Resp 16

## 2022-09-19 VITALS — BP 143/92 | HR 76 | Resp 18

## 2022-09-19 DIAGNOSIS — C711 Malignant neoplasm of frontal lobe: Secondary | ICD-10-CM | POA: Diagnosis not present

## 2022-09-19 DIAGNOSIS — Z800 Family history of malignant neoplasm of digestive organs: Secondary | ICD-10-CM | POA: Diagnosis not present

## 2022-09-19 DIAGNOSIS — G40909 Epilepsy, unspecified, not intractable, without status epilepticus: Secondary | ICD-10-CM | POA: Diagnosis not present

## 2022-09-19 DIAGNOSIS — Z5112 Encounter for antineoplastic immunotherapy: Secondary | ICD-10-CM | POA: Insufficient documentation

## 2022-09-19 DIAGNOSIS — C719 Malignant neoplasm of brain, unspecified: Secondary | ICD-10-CM

## 2022-09-19 DIAGNOSIS — Z87891 Personal history of nicotine dependence: Secondary | ICD-10-CM | POA: Diagnosis not present

## 2022-09-19 DIAGNOSIS — Z923 Personal history of irradiation: Secondary | ICD-10-CM | POA: Diagnosis not present

## 2022-09-19 DIAGNOSIS — Z86711 Personal history of pulmonary embolism: Secondary | ICD-10-CM | POA: Insufficient documentation

## 2022-09-19 DIAGNOSIS — Z79630 Long term (current) use of alkylating agent: Secondary | ICD-10-CM | POA: Insufficient documentation

## 2022-09-19 DIAGNOSIS — Z79899 Other long term (current) drug therapy: Secondary | ICD-10-CM | POA: Insufficient documentation

## 2022-09-19 DIAGNOSIS — Z7901 Long term (current) use of anticoagulants: Secondary | ICD-10-CM | POA: Insufficient documentation

## 2022-09-19 LAB — CMP (CANCER CENTER ONLY)
ALT: 91 U/L — ABNORMAL HIGH (ref 0–44)
AST: 69 U/L — ABNORMAL HIGH (ref 15–41)
Albumin: 4 g/dL (ref 3.5–5.0)
Alkaline Phosphatase: 157 U/L — ABNORMAL HIGH (ref 38–126)
Anion gap: 9 (ref 5–15)
BUN: 16 mg/dL (ref 8–23)
CO2: 24 mmol/L (ref 22–32)
Calcium: 9.7 mg/dL (ref 8.9–10.3)
Chloride: 111 mmol/L (ref 98–111)
Creatinine: 0.57 mg/dL (ref 0.44–1.00)
GFR, Estimated: >60 mL/min (ref >60)
Glucose, Bld: 107 mg/dL — ABNORMAL HIGH (ref 70–99)
Potassium: 3.7 mmol/L (ref 3.5–5.1)
Sodium: 144 mmol/L (ref 135–145)
Total Bilirubin: 0.4 mg/dL (ref 0.3–1.2)
Total Protein: 6.8 g/dL (ref 6.5–8.1)

## 2022-09-19 LAB — CBC WITH DIFFERENTIAL (CANCER CENTER ONLY)
Abs Immature Granulocytes: 0.01 10*3/uL (ref 0.00–0.07)
Basophils Absolute: 0 10*3/uL (ref 0.0–0.1)
Basophils Relative: 0 %
Eosinophils Absolute: 0 10*3/uL (ref 0.0–0.5)
Eosinophils Relative: 1 %
HCT: 45.8 % (ref 36.0–46.0)
Hemoglobin: 15.1 g/dL — ABNORMAL HIGH (ref 12.0–15.0)
Immature Granulocytes: 0 %
Lymphocytes Relative: 13 %
Lymphs Abs: 0.4 10*3/uL — ABNORMAL LOW (ref 0.7–4.0)
MCH: 35.4 pg — ABNORMAL HIGH (ref 26.0–34.0)
MCHC: 33 g/dL (ref 30.0–36.0)
MCV: 107.5 fL — ABNORMAL HIGH (ref 80.0–100.0)
Monocytes Absolute: 0.3 10*3/uL (ref 0.1–1.0)
Monocytes Relative: 12 %
Neutro Abs: 2.1 10*3/uL (ref 1.7–7.7)
Neutrophils Relative %: 74 %
Platelet Count: 83 10*3/uL — ABNORMAL LOW (ref 150–400)
RBC: 4.26 MIL/uL (ref 3.87–5.11)
RDW: 14.9 % (ref 11.5–15.5)
WBC Count: 2.9 10*3/uL — ABNORMAL LOW (ref 4.0–10.5)
nRBC: 0 % (ref 0.0–0.2)

## 2022-09-19 MED ORDER — SODIUM CHLORIDE 0.9 % IV SOLN: Freq: Once | INTRAVENOUS | Status: AC

## 2022-09-19 MED ORDER — SODIUM CHLORIDE 0.9 % IV SOLN
600.0000 mg | Freq: Once | INTRAVENOUS | Status: AC
  Administered 2022-09-19: 600 mg via INTRAVENOUS
  Filled 2022-09-19: qty 16

## 2022-09-19 NOTE — Progress Notes (Signed)
[facility] at [place], [place] [address] [phone]   Interval Evaluation  Date of Service: 09/19/22 Patient Name: Hannah [name] Patient MRN: [id number] Patient DOB: [birth year] Provider: [name], MD  Identifying Statement:  Hannah [name] is a 61 y.o. female with left frontal glioblastoma   Oncologic History: Oncology History  GBM (glioblastoma multiforme) ([facility])   Initial Diagnosis   GBM (glioblastoma multiforme) ([facility])   02/17/2020 Surgery   Craniotomy, left frontal resection by Dr. Marcello Moores.  Path is glioblastoma.   08/28/2020 - 10/09/2020 Radiation Therapy   Radiation and concurrent Temozolomide 31m/m2 daily   11/10/2020 - 12/09/2020 Chemotherapy   Completes 1 cycle of 5-day Temozolomide      [facility]   Admitted for saddle PE, undergoes thrombectomy and IVC filter placement   03/09/2021 Progression     03/27/2021 -  Chemotherapy   Initiates second line therapy with Avastin 131mkg IV q2 weeks    08/08/2021 - 08/09/2021 Chemotherapy   Patient is on Treatment Plan : BRAIN GLIOBLASTOMA Consolidation Temozolomide Days 1-5 q28 Days      12/03/2021 - 05/13/2022 Chemotherapy   Patient is on Treatment Plan : BRAIN Lomustine q42d     08/08/2022 -  Chemotherapy   Patient is on Treatment Plan : BRAIN Lomustine q42d       Biomarkers:  MGMT Methylated.  IDH 1/2 Wild type.  EGFR Unknown  TERT Unknown   Interval History: Hannah [name] presents today for avastin infusion.  Cycle 5 of CCNU was dosed without issue on 08/28/22.  Vimpat remains at 507mwice per day.  Continues to experience difficulty with transition to supported standing, as prior.  No further episodes of lethargy.  Leg edema is improved.  Continues reliance on family for functional support.  No other new or progressive complaints today.    Decadron 11/09/20: 2mg94m/10/22: 8mg 87m21/22: 6mg 080m7/22: 4mg 0324m/22:  8mg 04/53m22: 6mg 05/196m2: 3mg 05/2329m: 2mg 06/06/78m 1.5mg 06/20/265m-  H+P (01/20/20) Patient presented one month ago with sudden onset left arm and leg weakness and interrupted speech, [name], [name], stroke eval and CNS imaging which demonstrated a non-enhancing left frontal mass.  At this time she is back to baseline without recurrence of events, taking Keppra 500mg twice p32may.  No history or seizure or any other neurologic events.  She does describe being sleep deprived prior to seizure event.  She presents today after one month follow up MRI scan.  Medications: Current Outpatient Medications on File Prior to Visit  Medication Sig Dispense Refill   amLODipine (NORVASC) 5 MG tablet Take 1 tablet (5 mg total) by mouth daily. 30 tablet 3   Baclofen 5 MG TABS Take 1 tablet (5 mg) by mouth 3 (three) times daily. 90 tablet 3   cholecalciferol (VITAMIN D3) 25 MCG (1000 UNIT) tablet Take 1,000 Units by mouth daily.     docusate sodium (COLACE) 100 MG capsule Take 100 mg by mouth 2 (two) times daily as needed for mild constipation.     ELIQUIS 5 MG TABS tablet TAKE 1 TABLET BY MOUTH TWICE A DAY 180 tablet 1   Lacosamide 100 MG TABS Take 1 tablet (100 mg total) by mouth in the morning and at bedtime. 90 tablet 1   [DISCONTINUED] lamoTRIgine (  LAMICTAL) 100 MG tablet Take 1 tablet (100 mg total) by mouth daily. 30 tablet 3   No current facility-administered medications on file prior to visit.    Allergies: No Known Allergies Past Medical History:  Past Medical History:  Diagnosis Date   Cervical spondylolysis 12/15/2019   Skeleton: Mild cervical spondylosis C6-7. Incidental find.    Colon polyp    GBM (glioblastoma multiforme) ([facility])    Left ankle sprain 10/07/2011   Seizure ([facility] [facility])    partial    Tick bite 02/2020   Past Surgical History:  Past Surgical History:   Procedure Laterality Date   APPLICATION OF CRANIAL NAVIGATION Left 02/16/2020   Procedure: APPLICATION OF CRANIAL NAVIGATION;  Surgeon: Vallarie Mare, MD;  Location: [facility];  Service: Neurosurgery;  Laterality: Left;   CRANIOTOMY Left 02/16/2020   Procedure: LEFT FRONTAL CRANIOTOMY FOR BRAIN TUMOR;  Surgeon: Vallarie Mare, MD;  Location: [facility];  Service: Neurosurgery;  Laterality: Left;   FOOT SURGERY Left    bone spur - local anesthesia   IR ANGIOGRAM PULMONARY BILATERAL SELECTIVE  12/15/2020   IR ANGIOGRAM SELECTIVE EACH ADDITIONAL VESSEL  12/15/2020   IR ANGIOGRAM SELECTIVE EACH ADDITIONAL VESSEL  12/15/2020   IR IVC FILTER PLMT / S&I /IMG GUID/MOD SED  12/15/2020   IR THROMBECT PRIM MECH INIT (INCLU) MOD SED  12/15/2020   IR THROMBECT PRIM MECH INIT (INCLU) MOD SED  12/15/2020   RADIOLOGY WITH ANESTHESIA N/A 12/15/2020   Procedure: IR WITH ANESTHESIA;  Surgeon: Radiologist, Medication, MD;  Location: [facility];  Service: Radiology;  Laterality: N/A;   TONSILECTOMY/ADENOIDECTOMY WITH MYRINGOTOMY     tonsils and adenoids out only   WISDOM TOOTH EXTRACTION     Social History:  Social History   Socioeconomic History   Marital status: Married    Spouse name: Not on file   Number of children: 3   Years of education: Not on file   Highest education level: Not on file  Occupational History   Not on file  Tobacco Use   Smoking status: Former    Years: 2.00    Types: Cigarettes    Quit date: 11/05/1979    Years since quitting: 42.9   Smokeless tobacco: Never   Tobacco comments:    1/2 pack a week  Vaping Use   Vaping Use: Never used  Substance and Sexual Activity   Alcohol use: Yes    Alcohol/week: 0.0 standard drinks of alcohol    Comment: 1 glass of wine weekly   Drug use: No   Sexual activity: Yes    Birth control/protection: Surgical  Other Topics Concern   Not on file  Social History Narrative   Not on file   Social Determinants of Health   Financial Resource Strain:  Low Risk  (07/19/2022)   Overall Financial Resource Strain (CARDIA)    Difficulty of Paying Living Expenses: Not very hard  Food Insecurity: No Food Insecurity (07/19/2022)   Hunger Vital Sign    Worried About Running Out of Food in the Last Year: Never true    Ran Out of Food in the Last Year: Never true  Transportation Needs: No Transportation Needs (07/19/2022)   PRAPARE - Hydrologist (Medical): No    Lack of Transportation (Non-Medical): No  Physical Activity: Not on file  Stress: Not on file  Social Connections: Not on file  Intimate Partner Violence: Not At Risk (07/19/2022)   Humiliation, Afraid, Rape, and Kick  questionnaire    Fear of Current or Ex-Partner: No    Emotionally Abused: No    Physically Abused: No    Sexually Abused: No   Family History:  Family History  Problem Relation Age of Onset   Hypertension Mother    Stroke Mother    Diabetes Mother    Colon cancer Father 45   Hypertension Sister    Colon polyps Sister    Heart disease Brother 18   Diabetes Maternal Uncle    Diabetes Maternal Uncle     Review of Systems: Constitutional: Doesn't report fevers, chills or abnormal weight loss Eyes: Doesn't report blurriness of vision Ears, nose, mouth, throat, and face: Doesn't report sore throat Respiratory: Chest wall pain Cardiovascular: Doesn't report palpitation, chest discomfort  Gastrointestinal:  Doesn't report nausea, constipation, diarrhea GU: Doesn't report incontinence Skin: Doesn't report skin rashes Neurological: Per HPI Musculoskeletal: normal Behavioral/Psych: Doesn't report anxiety  Physical Exam: Vitals:   09/19/22 1108  BP: (!) 139/98  Pulse: 85  Resp: 16  Temp: 97.7 F (36.5 C)  SpO2: 93%   KPS: 60. General: Alert, cooperative, pleasant, in no acute distress Head: Normal EENT: No conjunctival injection or scleral icterus.  Lungs: Resp effort normal Cardiac: Regular rate Abdomen: Non-distended  abdomen Skin: Grade 1/2 sacral ulcer Extremities: No clubbing or edema  Neurologic Exam: Mental Status: Awake, alert, attentive to examiner. Both receptive and expressive dysphasia.  Cranial Nerves: Visual acuity is grossly normal. Visual fields are full. Extra-ocular movements intact. No ptosis. Face is symmetric Motor: Tone and bulk are normal. R arm 1/5. Right leg 3/5, left leg 4/5, proximal greater than distal weakness.  Noted R leg flexor spasticity. Reflexes are symmetric, no pathologic reflexes present.  Sensory: Intact to light touch Gait: Deferred today  Labs: I have reviewed the data as listed    Component Value Date/Time   NA 145 08/29/2022 1016   K 3.7 08/29/2022 1016   CL 112 (H) 08/29/2022 1016   CO2 24 08/29/2022 1016   GLUCOSE 135 (H) 08/29/2022 1016   GLUCOSE 85 10/13/2006 1158   BUN 23 08/29/2022 1016   CREATININE 0.67 08/29/2022 1016   CALCIUM 9.4 08/29/2022 1016   PROT 6.6 08/29/2022 1016   ALBUMIN 3.7 08/29/2022 1016   AST 115 (H) 08/29/2022 1016   ALT 134 (H) 08/29/2022 1016   ALKPHOS 139 (H) 08/29/2022 1016   BILITOT 0.5 08/29/2022 1016   GFRNONAA >60 08/29/2022 1016   GFRAA >60 02/17/2020 0136   Lab Results  Component Value Date   WBC 2.9 (L) 09/19/2022   NEUTROABS 2.1 09/19/2022   HGB 15.1 (H) 09/19/2022   HCT 45.8 09/19/2022   MCV 107.5 (H) 09/19/2022   PLT 83 (L) 09/19/2022    Assessment/Plan GBM (glioblastoma multiforme) (HCC)  Seizure disorder (HCC)  Hannah [name] is clinically stable today, now day 23/42 of cycle #5 CCNU/avastin.  Labs are improved today, thrombocytopenia resolved.  She will con't with cycle #5 CCNU 25m/m2 q6 week with Avastin 196mkg IV q3 weeks.  We will administer udenyca for bone marrow support today.  Chemotherapy should be held for the following:  ANC less than 1,000  Platelets less than 100,000  LFT or creatinine greater than 2x ULN  If clinical concerns/contraindications develop  Avastin should be  held for the following:  ANC less than 500  Platelets less than 50,000  LFT or creatinine greater than 2x ULN  If clinical concerns/contraindications develop  We will keep an eye on  her sacral ulcer ongoing.  Will con't Norvasc 64m daily.    Vimpat may con't 53mBID; con't Eliquis as prior.   Baclofen con't 64m664m8 pRN.  We ask that Hannah [name] return to clinic in 3 weeks for avastin.  Next MRI can be late December, per patient and family preference.  All questions were answered. The patient knows to call the clinic with any problems, questions or concerns. No barriers to learning were detected.  I have spent a total of 30 minutes of face-to-face and non-face-to-face time, excluding clinical staff time, preparing to see patient, ordering tests and/or medications, counseling the patient, and independently interpreting results and communicating results to the patient/family/caregiver    ZacVentura SellersD Medical Director of Neuro-Oncology [facility]/16/23 10:57 AM

## 2022-09-19 NOTE — Progress Notes (Signed)
Per MD, pt ok to treat today with LAB results and no urine protein sample.

## 2022-09-19 NOTE — Patient Instructions (Signed)
[facility]  Discharge Instructions: Thank you for choosing [facility] to provide your oncology and hematology care.   If you have a lab appointment with the Cancer Center, please go directly to the Cancer Center and check in at the registration area.   Wear comfortable clothing and clothing appropriate for easy access to any Portacath or PICC line.   We strive to give you quality time with your provider. You may need to reschedule your appointment if you arrive late (15 or more minutes).  Arriving late affects you and other patients whose appointments are after yours.  Also, if you miss three or more appointments without notifying the office, you may be dismissed from the clinic at the provider's discretion.      For prescription refill requests, have your pharmacy contact our office and allow 72 hours for refills to be completed.    Today you received the following chemotherapy and/or immunotherapy agents: MVASI      To help prevent nausea and vomiting after your treatment, we encourage you to take your nausea medication as directed.  BELOW ARE SYMPTOMS THAT SHOULD BE REPORTED IMMEDIATELY: *FEVER GREATER THAN 100.4 F (38 C) OR HIGHER *CHILLS OR SWEATING *NAUSEA AND VOMITING THAT IS NOT CONTROLLED WITH YOUR NAUSEA MEDICATION *UNUSUAL SHORTNESS OF BREATH *UNUSUAL BRUISING OR BLEEDING *URINARY PROBLEMS (pain or burning when urinating, or frequent urination) *BOWEL PROBLEMS (unusual diarrhea, constipation, pain near the anus) TENDERNESS IN MOUTH AND THROAT WITH OR WITHOUT PRESENCE OF ULCERS (sore throat, sores in mouth, or a toothache) UNUSUAL RASH, SWELLING OR PAIN  UNUSUAL VAGINAL DISCHARGE OR ITCHING   Items with * indicate a potential emergency and should be followed up as soon as possible or go to the Emergency Department if any problems should occur.  Please show the CHEMOTHERAPY ALERT CARD or IMMUNOTHERAPY ALERT CARD at check-in to the  Emergency Department and triage nurse.  Should you have questions after your visit or need to cancel or reschedule your appointment, please contact [facility]  Dept: [phone]  and follow the prompts.  Office hours are 8:00 a.m. to 4:30 p.m. Monday - Friday. Please note that voicemails left after 4:00 p.m. may not be returned until the following business day.  We are closed weekends and major holidays. You have access to a nurse at all times for urgent questions. Please call the main number to the clinic Dept: [phone] and follow the prompts.   For any non-urgent questions, you may also contact your provider using MyChart. We now offer e-Visits for anyone 18 and older to request care online for non-urgent symptoms. For details visit [email or web address].   Also download the MyChart app! Go to the app store, search "MyChart", open the app, select [facility], and log in with your MyChart username and password.  Masks are optional in the cancer centers. If you would like for your care team to wear a mask while they are taking care of you, please let them know. You may have one support person who is at least 61 years old accompany you for your appointments. 

## 2022-09-20 ENCOUNTER — Telehealth: Payer: Self-pay | Admitting: Internal Medicine

## 2022-09-20 NOTE — Telephone Encounter (Signed)
Per 11/16 los called and spoke to pt husband about appointment

## 2022-09-21 ENCOUNTER — Other Ambulatory Visit: Payer: Self-pay

## 2022-09-30 ENCOUNTER — Other Ambulatory Visit (HOSPITAL_COMMUNITY): Payer: Self-pay

## 2022-10-02 ENCOUNTER — Other Ambulatory Visit: Payer: Self-pay | Admitting: Internal Medicine

## 2022-10-03 ENCOUNTER — Other Ambulatory Visit: Payer: Self-pay

## 2022-10-04 ENCOUNTER — Telehealth: Payer: Self-pay

## 2022-10-04 NOTE — Telephone Encounter (Signed)
Notified Lanny Hurst, Patient's Spouse, of completion of Attending Physician Statement for [facility]. Request for medical records forwarded to [facility] with signed Release of Information Form. Copy of Attending Physician Statement and request for records mailed to Patient as requested by Lanny Hurst. No other needs or concerns voiced.

## 2022-10-10 ENCOUNTER — Inpatient Hospital Stay: Payer: BC Managed Care – PPO | Attending: Internal Medicine

## 2022-10-10 ENCOUNTER — Other Ambulatory Visit: Payer: Self-pay

## 2022-10-10 ENCOUNTER — Inpatient Hospital Stay: Payer: BC Managed Care – PPO

## 2022-10-10 ENCOUNTER — Inpatient Hospital Stay: Payer: BC Managed Care – PPO | Admitting: Internal Medicine

## 2022-10-10 VITALS — BP 134/90 | HR 69 | Temp 98.0°F | Resp 18

## 2022-10-10 VITALS — BP 141/92 | HR 88 | Temp 97.7°F | Resp 16 | Ht 65.0 in

## 2022-10-10 DIAGNOSIS — G40909 Epilepsy, unspecified, not intractable, without status epilepticus: Secondary | ICD-10-CM

## 2022-10-10 DIAGNOSIS — C719 Malignant neoplasm of brain, unspecified: Secondary | ICD-10-CM

## 2022-10-10 DIAGNOSIS — Z87891 Personal history of nicotine dependence: Secondary | ICD-10-CM | POA: Insufficient documentation

## 2022-10-10 DIAGNOSIS — D696 Thrombocytopenia, unspecified: Secondary | ICD-10-CM | POA: Diagnosis not present

## 2022-10-10 DIAGNOSIS — Z7901 Long term (current) use of anticoagulants: Secondary | ICD-10-CM | POA: Insufficient documentation

## 2022-10-10 DIAGNOSIS — Z79899 Other long term (current) drug therapy: Secondary | ICD-10-CM | POA: Insufficient documentation

## 2022-10-10 DIAGNOSIS — Z5112 Encounter for antineoplastic immunotherapy: Secondary | ICD-10-CM | POA: Insufficient documentation

## 2022-10-10 DIAGNOSIS — Z79630 Long term (current) use of alkylating agent: Secondary | ICD-10-CM | POA: Diagnosis not present

## 2022-10-10 DIAGNOSIS — C711 Malignant neoplasm of frontal lobe: Secondary | ICD-10-CM | POA: Diagnosis present

## 2022-10-10 LAB — CBC WITH DIFFERENTIAL (CANCER CENTER ONLY)
Abs Immature Granulocytes: 0.01 10*3/uL (ref 0.00–0.07)
Basophils Absolute: 0 10*3/uL (ref 0.0–0.1)
Basophils Relative: 0 %
Eosinophils Absolute: 0 10*3/uL (ref 0.0–0.5)
Eosinophils Relative: 2 %
HCT: 46.5 % — ABNORMAL HIGH (ref 36.0–46.0)
Hemoglobin: 15.7 g/dL — ABNORMAL HIGH (ref 12.0–15.0)
Immature Granulocytes: 0 %
Lymphocytes Relative: 15 %
Lymphs Abs: 0.4 10*3/uL — ABNORMAL LOW (ref 0.7–4.0)
MCH: 36.1 pg — ABNORMAL HIGH (ref 26.0–34.0)
MCHC: 33.8 g/dL (ref 30.0–36.0)
MCV: 106.9 fL — ABNORMAL HIGH (ref 80.0–100.0)
Monocytes Absolute: 0.3 10*3/uL (ref 0.1–1.0)
Monocytes Relative: 14 %
Neutro Abs: 1.7 10*3/uL (ref 1.7–7.7)
Neutrophils Relative %: 69 %
Platelet Count: 72 10*3/uL — ABNORMAL LOW (ref 150–400)
RBC: 4.35 MIL/uL (ref 3.87–5.11)
RDW: 14 % (ref 11.5–15.5)
WBC Count: 2.4 10*3/uL — ABNORMAL LOW (ref 4.0–10.5)
nRBC: 0 % (ref 0.0–0.2)

## 2022-10-10 LAB — CMP (CANCER CENTER ONLY)
ALT: 87 U/L — ABNORMAL HIGH (ref 0–44)
AST: 78 U/L — ABNORMAL HIGH (ref 15–41)
Albumin: 3.8 g/dL (ref 3.5–5.0)
Alkaline Phosphatase: 166 U/L — ABNORMAL HIGH (ref 38–126)
Anion gap: 9 (ref 5–15)
BUN: 12 mg/dL (ref 8–23)
CO2: 23 mmol/L (ref 22–32)
Calcium: 9.7 mg/dL (ref 8.9–10.3)
Chloride: 109 mmol/L (ref 98–111)
Creatinine: 0.52 mg/dL (ref 0.44–1.00)
GFR, Estimated: >60 mL/min (ref >60)
Glucose, Bld: 107 mg/dL — ABNORMAL HIGH (ref 70–99)
Potassium: 3.8 mmol/L (ref 3.5–5.1)
Sodium: 141 mmol/L (ref 135–145)
Total Bilirubin: 0.5 mg/dL (ref 0.3–1.2)
Total Protein: 6.4 g/dL — ABNORMAL LOW (ref 6.5–8.1)

## 2022-10-10 MED ORDER — SODIUM CHLORIDE 0.9 % IV SOLN
600.0000 mg | Freq: Once | INTRAVENOUS | Status: AC
  Administered 2022-10-10: 600 mg via INTRAVENOUS
  Filled 2022-10-10: qty 16

## 2022-10-10 MED ORDER — SODIUM CHLORIDE 0.9 % IV SOLN: Freq: Once | INTRAVENOUS | Status: AC

## 2022-10-10 NOTE — Patient Instructions (Signed)
[facility]COL[facility]  Discharge Instructions: Thank you for choosing [facility] to provide your oncology and hematology care.   If you have a lab appointment with the [facility], please go directly to the [facility] and check in at the registration area.   Wear comfortable clothing and clothing appropriate for easy access to any Portacath or PICC line.   We strive to give you quality time with your provider. You may need to reschedule your appointment if you arrive late (15 or more minutes).  Arriving late affects you and other patients whose appointments are after yours.  Also, if you miss three or more appointments without notifying the office, you may be dismissed from the clinic at the provider's discretion.      For prescription refill requests, have your pharmacy contact our office and allow 72 hours for refills to be completed.    Today you received the following chemotherapy and/or immunotherapy agent: Bevacizumab (Avastin)   To help prevent nausea and vomiting after your treatment, we encourage you to take your nausea medication as directed.  BELOW ARE SYMPTOMS THAT SHOULD BE REPORTED IMMEDIATELY: *FEVER GREATER THAN 100.4 F (38 C) OR HIGHER *CHILLS OR SWEATING *NAUSEA AND VOMITING THAT IS NOT CONTROLLED WITH YOUR NAUSEA MEDICATION *UNUSUAL SHORTNESS OF BREATH *UNUSUAL BRUISING OR BLEEDING *URINARY PROBLEMS (pain or burning when urinating, or frequent urination) *BOWEL PROBLEMS (unusual diarrhea, constipation, pain near the anus) TENDERNESS IN MOUTH AND THROAT WITH OR WITHOUT PRESENCE OF ULCERS (sore throat, sores in mouth, or a toothache) UNUSUAL RASH, SWELLING OR PAIN  UNUSUAL VAGINAL DISCHARGE OR ITCHING   Items with * indicate a potential emergency and should be followed up as soon as possible or go to the Emergency Department if any problems should occur.  Please show the CHEMOTHERAPY ALERT CARD or IMMUNOTHERAPY ALERT CARD at  check-in to the Emergency Department and triage nurse.  Should you have questions after your visit or need to cancel or reschedule your appointment, please contact [facility]  Dept: [phone]  and follow the prompts.  Office hours are 8:00 a.m. to 4:30 p.m. Monday - Friday. Please note that voicemails left after 4:00 p.m. may not be returned until the following business day.  We are closed weekends and major holidays. You have access to a nurse at all times for urgent questions. Please call the main number to the clinic Dept: [phone] and follow the prompts.   For any non-urgent questions, you may also contact your provider using MyChart. We now offer e-Visits for anyone 2 and older to request care online for non-urgent symptoms. For details visit [email or web address].   Also download the MyChart app! Go to the app store, search "MyChart", open the app, select [facility], and log in with your MyChart username and password.  Masks are optional in the cancer centers. If you would like for your care team to wear a mask while they are taking care of you, please let them know. You may have one support person who is at least 61 years old accompany you for your appointments. Bevacizumab Injection What is this medication? BEVACIZUMAB (be va SIZ yoo mab) treats some types of cancer. It works by blocking a protein that causes cancer cells to grow and multiply. This helps to slow or stop the spread of cancer cells. It is a monoclonal antibody. This medicine may be used for other purposes; ask your health care provider or pharmacist if you  have questions. COMMON BRAND NAME(S): Alymsys, Avastin, MVASI, Noah Charon What should I tell my care team before I take this medication? They need to know if you have any of these conditions: Blood clots Coughing up blood Having or recent surgery Heart failure High blood pressure History of a connection between 2 or more body parts  that do not usually connect (fistula) History of a tear in your stomach or intestines Protein in your urine An unusual or allergic reaction to bevacizumab, other medications, foods, dyes, or preservatives Pregnant or trying to get pregnant Breast-feeding How should I use this medication? This medication is injected into a vein. It is given by your care team in a hospital or clinic setting. Talk to your care team the use of this medication in children. Special care may be needed. Overdosage: If you think you have taken too much of this medicine contact a poison control center or emergency room at once. NOTE: This medicine is only for you. Do not share this medicine with others. What if I miss a dose? Keep appointments for follow-up doses. It is important not to miss your dose. Call your care team if you are unable to keep an appointment. What may interact with this medication? Interactions are not expected. This list may not describe all possible interactions. Give your health care provider a list of all the medicines, herbs, non-prescription drugs, or dietary supplements you use. Also tell them if you smoke, drink alcohol, or use illegal drugs. Some items may interact with your medicine. What should I watch for while using this medication? Your condition will be monitored carefully while you are receiving this medication. You may need blood work while taking this medication. This medication may make you feel generally unwell. This is not uncommon as chemotherapy can affect healthy cells as well as cancer cells. Report any side effects. Continue your course of treatment even though you feel ill unless your care team tells you to stop. This medication may increase your risk to bruise or bleed. Call your care team if you notice any unusual bleeding. Before having surgery, talk to your care team to make sure it is ok. This medication can increase the risk of poor healing of your surgical site or  wound. You will need to stop this medication for 28 days before surgery. After surgery, wait at least 28 days before restarting this medication. Make sure the surgical site or wound is healed enough before restarting this medication. Talk to your care team if questions. Talk to your care team if you may be pregnant. Serious birth defects can occur if you take this medication during pregnancy and for 6 months after the last dose. Contraception is recommended while taking this medication and for 6 months after the last dose. Your care team can help you find the option that works for you. Do not breastfeed while taking this medication and for 6 months after the last dose. This medication can cause infertility. Talk to your care team if you are concerned about your fertility. What side effects may I notice from receiving this medication? Side effects that you should report to your care team as soon as possible: Allergic reactions--skin rash, itching, hives, swelling of the face, lips, tongue, or throat Bleeding--bloody or black, tar-like stools, vomiting blood or brown material that looks like coffee grounds, red or dark brown urine, small red or purple spots on skin, unusual bruising or bleeding Blood clot--pain, swelling, or warmth in the leg, shortness of breath,  chest pain Heart attack--pain or tightness in the chest, shoulders, arms, or jaw, nausea, shortness of breath, cold or clammy skin, feeling faint or lightheaded Heart failure--shortness of breath, swelling of the ankles, feet, or hands, sudden weight gain, unusual weakness or fatigue Increase in blood pressure Infection--fever, chills, cough, sore throat, wounds that don't heal, pain or trouble when passing urine, general feeling of discomfort or being unwell Infusion reactions--chest pain, shortness of breath or trouble breathing, feeling faint or lightheaded Kidney injury--decrease in the amount of urine, swelling of the ankles, hands, or  feet Stomach pain that is severe, does not go away, or gets worse Stroke--sudden numbness or weakness of the face, arm, or leg, trouble speaking, confusion, trouble walking, loss of balance or coordination, dizziness, severe headache, change in vision Sudden and severe headache, confusion, change in vision, seizures, which may be signs of posterior reversible encephalopathy syndrome (PRES) Side effects that usually do not require medical attention (report to your care team if they continue or are bothersome): Back pain Change in taste Diarrhea Dry skin Increased tears Nosebleed This list may not describe all possible side effects. Call your doctor for medical advice about side effects. You may report side effects to FDA at 1-800-FDA-1088. Where should I keep my medication? This medication is given in a hospital or clinic. It will not be stored at home. NOTE: This sheet is a summary. It may not cover all possible information. If you have questions about this medicine, talk to your doctor, pharmacist, or health care provider.  2023 Elsevier/Gold Standard (2022-02-22 00:00:00)

## 2022-10-10 NOTE — Progress Notes (Signed)
[facility] at [place], [place] [address] [phone]   Interval Evaluation  Date of Service: 10/10/22 Patient Name: Hannah [name] Patient MRN: [id number] Patient DOB: 09-Nov-1960 Provider: [name], MD  Identifying Statement:  Hannah [name] is a 61 y.o. female with left frontal glioblastoma   Oncologic History: Oncology History  GBM (glioblastoma multiforme) ([facility])   Initial Diagnosis   GBM (glioblastoma multiforme) ([facility])   02/17/2020 Surgery   Craniotomy, left frontal resection by Dr. Marcello Moores.  Path is glioblastoma.   08/28/2020 - 10/09/2020 Radiation Therapy   Radiation and concurrent Temozolomide 39m/m2 daily   11/10/2020 - 12/09/2020 Chemotherapy   Completes 1 cycle of 5-day Temozolomide      [facility]   Ad[facility] for [facility], undergoes thrombectomy and IVC filter placement   03/09/2021 Progression     03/27/2021 -  Chemotherapy   Initiates second line therapy with Avastin 158mkg IV q2 weeks    08/08/2021 - 08/09/2021 Chemotherapy   Patient is on Treatment Plan : BRAIN GLIOBLASTOMA Consolidation Temozolomide Days 1-5 q28 Days      12/03/2021 - 05/13/2022 Chemotherapy   Patient is on Treatment Plan : BRAIN Lomustine q42d     08/08/2022 -  Chemotherapy   Patient is on Treatment Plan : BRAIN Lomustine q42d       Biomarkers:  MGMT Methylated.  IDH 1/2 Wild type.  EGFR Unknown  TERT Unknown   Interval History: Hannah [name] presents today for avastin infusion.  No new or progressive deficits described.  Vimpat remains at 5045mwice per day.  Continues to experience difficulty with transition to supported standing, as prior.  Continues reliance on family for functional support.  No headaches or seizures.  Decadron 11/09/20: 2mg84m/10/22: 8mg 70m21/22: 6mg 02m7/22: 4mg 0350m/22: 8mg 04/58m22: 6mg 05/174m2: 3mg 05/2317m: 2mg 06/06/9m 1.5mg 06/20/222m-  H+P (01/20/20)  Patient presented one month ago with sudden onset left arm and leg weakness and interrupted speech, [name], [name], stroke eval and CNS imaging which demonstrated a non-enhancing left frontal mass.  At this time she is back to baseline without recurrence of events, taking Keppra 500mg twice p7may.  No history or seizure or any other neurologic events.  She does describe being sleep deprived prior to seizure event.  She presents today after one month follow up MRI scan.  Medications: Current Outpatient Medications on File Prior to Visit  Medication Sig Dispense Refill   amLODipine (NORVASC) 5 MG tablet Take 1 tablet (5 mg total) by mouth daily. 30 tablet 3   Baclofen 5 MG TABS TAKE 1 TABLET (5 MG) BY MOUTH 3 (THREE) TIMES DAILY. 90 tablet 3   cholecalciferol (VITAMIN D3) 25 MCG (1000 UNIT) tablet Take 1,000 Units by mouth daily.     docusate sodium (COLACE) 100 MG capsule Take 100 mg by mouth 2 (two) times daily as needed for mild constipation.     ELIQUIS 5 MG TABS tablet TAKE 1 TABLET BY MOUTH TWICE A DAY 180 tablet 1   Lacosamide 100 MG TABS Take 1 tablet (100 mg total) by mouth in the morning and at bedtime. 90 tablet 1   [DISCONTINUED] lamoTRIgine (LAMICTAL) 100 MG tablet Take 1 tablet (100 mg total) by mouth daily. 30 tablet 3   No current  facility-administered medications on file prior to visit.    Allergies: No Known Allergies Past Medical History:  Past Medical History:  Diagnosis Date   Cervical spondylolysis 12/15/2019   Skeleton: Mild cervical spondylosis C6-7. Incidental find.    Colon polyp    GBM (glioblastoma multiforme) ([facility])    Left ankle sprain 10/07/2011   Seizure ([facility])    partial    Tick bite 02/2020   Past Surgical History:  Past Surgical History:  Procedure Laterality Date   APPLICATION OF CRANIAL NAVIGATION Left 02/16/2020   Procedure:  APPLICATION OF CRANIAL NAVIGATION;  Surgeon: Vallarie Mare, MD;  Location: [facility];  Service: Neurosurgery;  Laterality: Left;   CRANIOTOMY Left 02/16/2020   Procedure: LEFT FRONTAL CRANIOTOMY FOR BRAIN TUMOR;  Surgeon: Vallarie Mare, MD;  Location: [facility];  Service: Neurosurgery;  Laterality: Left;   FOOT SURGERY Left    bone spur - local anesthesia   IR ANGIOGRAM PULMONARY BILATERAL SELECTIVE  12/15/2020   IR ANGIOGRAM SELECTIVE EACH ADDITIONAL VESSEL  12/15/2020   IR ANGIOGRAM SELECTIVE EACH ADDITIONAL VESSEL  12/15/2020   IR IVC FILTER PLMT / S&I /IMG GUID/MOD SED  12/15/2020   IR THROMBECT PRIM MECH INIT (INCLU) MOD SED  12/15/2020   IR THROMBECT PRIM MECH INIT (INCLU) MOD SED  12/15/2020   RADIOLOGY WITH ANESTHESIA N/A 12/15/2020   Procedure: IR WITH ANESTHESIA;  Surgeon: Radiologist, Medication, MD;  Location: [facility];  Service: Radiology;  Laterality: N/A;   TONSILECTOMY/ADENOIDECTOMY WITH MYRINGOTOMY     tonsils and adenoids out only   WISDOM TOOTH EXTRACTION     Social History:  Social History   Socioeconomic History   Marital status: Married    Spouse name: Not on file   Number of children: 3   Years of education: Not on file   Highest education level: Not on file  Occupational History   Not on file  Tobacco Use   Smoking status: Former    Years: 2.00    Types: Cigarettes    Quit date: 11/05/1979    Years since quitting: 42.9   Smokeless tobacco: Never   Tobacco comments:    1/2 pack a week  Vaping Use   Vaping Use: Never used  Substance and Sexual Activity   Alcohol use: Yes    Alcohol/week: 0.0 standard drinks of alcohol    Comment: 1 glass of wine weekly   Drug use: No   Sexual activity: Yes    Birth control/protection: Surgical  Other Topics Concern   Not on file  Social History Narrative   Not on file   Social Determinants of Health   Financial Resource Strain: Low Risk  (07/19/2022)   Overall Financial Resource Strain (CARDIA)    Difficulty of Paying  Living Expenses: Not very hard  Food Insecurity: No Food Insecurity (07/19/2022)   Hunger Vital Sign    Worried About Running Out of Food in the Last Year: Never true    Ran Out of Food in the Last Year: Never true  Transportation Needs: No Transportation Needs (07/19/2022)   PRAPARE - Hydrologist (Medical): No    Lack of Transportation (Non-Medical): No  Physical Activity: Not on file  Stress: Not on file  Social Connections: Not on file  Intimate Partner Violence: Not At Risk (07/19/2022)   Humiliation, Afraid, Rape, and Kick questionnaire    Fear of Current or Ex-Partner: No    Emotionally Abused: No    Physically  Abused: No    Sexually Abused: No   Family History:  Family History  Problem Relation Age of Onset   Hypertension Mother    Stroke Mother    Diabetes Mother    Colon cancer Father 53   Hypertension Sister    Colon polyps Sister    Heart disease Brother 69   Diabetes Maternal Uncle    Diabetes Maternal Uncle     Review of Systems: Constitutional: Doesn't report fevers, chills or abnormal weight loss Eyes: Doesn't report blurriness of vision Ears, nose, mouth, throat, and face: Doesn't report sore throat Respiratory: Chest wall pain Cardiovascular: Doesn't report palpitation, chest discomfort  Gastrointestinal:  Doesn't report nausea, constipation, diarrhea GU: Doesn't report incontinence Skin: Doesn't report skin rashes Neurological: Per HPI Musculoskeletal: normal Behavioral/Psych: Doesn't report anxiety  Physical Exam: Vitals:   10/10/22 1058  BP: (!) 141/92  Pulse: 88  Resp: 16  Temp: 97.7 F (36.5 C)  SpO2: 98%   KPS: 60. General: Alert, cooperative, pleasant, in no acute distress Head: Normal EENT: No conjunctival injection or scleral icterus.  Lungs: Resp effort normal Cardiac: Regular rate Abdomen: Non-distended abdomen Skin: Grade 1/2 sacral ulcer Extremities: No clubbing or edema  Neurologic Exam: Mental  Status: Awake, alert, attentive to examiner. Both receptive and expressive dysphasia.  Cranial Nerves: Visual acuity is grossly normal. Visual fields are full. Extra-ocular movements intact. No ptosis. Face is symmetric Motor: Tone and bulk are normal. R arm 1/5. Right leg 3/5, left leg 4/5, proximal greater than distal weakness.  Noted R leg flexor spasticity. Reflexes are symmetric, no pathologic reflexes present.  Sensory: Intact to light touch Gait: Deferred today  Labs: I have reviewed the data as listed    Component Value Date/Time   NA 144 09/19/2022 1048   K 3.7 09/19/2022 1048   CL 111 09/19/2022 1048   CO2 24 09/19/2022 1048   GLUCOSE 107 (H) 09/19/2022 1048   GLUCOSE 85 10/13/2006 1158   BUN 16 09/19/2022 1048   CREATININE 0.57 09/19/2022 1048   CALCIUM 9.7 09/19/2022 1048   PROT 6.8 09/19/2022 1048   ALBUMIN 4.0 09/19/2022 1048   AST 69 (H) 09/19/2022 1048   ALT 91 (H) 09/19/2022 1048   ALKPHOS 157 (H) 09/19/2022 1048   BILITOT 0.4 09/19/2022 1048   GFRNONAA >60 09/19/2022 1048   GFRAA >60 02/17/2020 0136   Lab Results  Component Value Date   WBC 2.4 (L) 10/10/2022   NEUTROABS 1.7 10/10/2022   HGB 15.7 (H) 10/10/2022   HCT 46.5 (H) 10/10/2022   MCV 106.9 (H) 10/10/2022   PLT 72 (L) 10/10/2022    Assessment/Plan GBM (glioblastoma multiforme) (HCC) - Plan: Total Protein, Urine dipstick  Seizure disorder (HCC)  Hannah [name] is clinically stable today, now having completed cycle #5 CCNU/avastin.  Labs demonstrate recurrence of thrombocytopenia today.  Ok with Avastin 51m/kg IV today.  Will not dose CCNU until we obtain next MRI study.  Chemotherapy should be held for the following:  ANC less than 1,000  Platelets less than 100,000  LFT or creatinine greater than 2x ULN  If clinical concerns/contraindications develop  Avastin should be held for the following:  ANC less than 500  Platelets less than 50,000  LFT or creatinine greater than 2x  ULN  If clinical concerns/contraindications develop.  Will con't Norvasc 522mdaily.    Vimpat may con't 5022mID; con't Eliquis as prior.   Baclofen con't 5mg38m pRN.  We ask that Hannah [name] return to clinic in 3 weeks for avastin and MRI brain for evaluation.  All questions were answered. The patient knows to call the clinic with any problems, questions or concerns. No barriers to learning were detected.  I have spent a total of 30 minutes of face-to-face and non-face-to-face time, excluding clinical staff time, preparing to see patient, ordering tests and/or medications, counseling the patient, and independently interpreting results and communicating results to the patient/family/caregiver    [name], MD Medical Director of Neuro-Oncology [facility] at [facility] 10/10/22 11:16 AM

## 2022-10-10 NOTE — Progress Notes (Signed)
Ok to proceed with Avastin 10/10/2022 with platelets, vitals and no urine protein.  Sending urine cup home with patient to collect at home on day of next infusion.

## 2022-10-11 ENCOUNTER — Telehealth: Payer: Self-pay | Admitting: Internal Medicine

## 2022-10-11 ENCOUNTER — Other Ambulatory Visit (HOSPITAL_COMMUNITY): Payer: Self-pay

## 2022-10-11 NOTE — Telephone Encounter (Signed)
Per 12/7 los called and spoke to pt husband about appointment

## 2022-10-13 ENCOUNTER — Other Ambulatory Visit: Payer: Self-pay

## 2022-10-14 ENCOUNTER — Other Ambulatory Visit: Payer: Self-pay

## 2022-10-23 ENCOUNTER — Other Ambulatory Visit: Payer: Self-pay

## 2022-10-30 ENCOUNTER — Ambulatory Visit (HOSPITAL_COMMUNITY)
Admission: RE | Admit: 2022-10-30 | Discharge: 2022-10-30 | Disposition: A | Discharge status: Home / Self Care | Payer: BC Managed Care – PPO | Source: Ambulatory Visit | Attending: Internal Medicine | Admitting: Internal Medicine

## 2022-10-30 DIAGNOSIS — C719 Malignant neoplasm of brain, unspecified: Secondary | ICD-10-CM | POA: Diagnosis present

## 2022-10-30 MED ORDER — GADOBUTROL 1 MMOL/ML IV SOLN
6.0000 mL | Freq: Once | INTRAVENOUS | Status: AC | PRN
  Administered 2022-10-30: 6 mL via INTRAVENOUS

## 2022-10-31 ENCOUNTER — Other Ambulatory Visit: Payer: Self-pay

## 2022-10-31 ENCOUNTER — Inpatient Hospital Stay: Payer: BC Managed Care – PPO | Admitting: Internal Medicine

## 2022-10-31 ENCOUNTER — Inpatient Hospital Stay: Payer: BC Managed Care – PPO

## 2022-10-31 ENCOUNTER — Telehealth: Payer: Self-pay

## 2022-10-31 VITALS — BP 152/97 | HR 89 | Temp 97.9°F | Resp 17

## 2022-10-31 DIAGNOSIS — C711 Malignant neoplasm of frontal lobe: Secondary | ICD-10-CM | POA: Diagnosis not present

## 2022-10-31 DIAGNOSIS — C719 Malignant neoplasm of brain, unspecified: Secondary | ICD-10-CM | POA: Diagnosis not present

## 2022-10-31 DIAGNOSIS — G40909 Epilepsy, unspecified, not intractable, without status epilepticus: Secondary | ICD-10-CM

## 2022-10-31 LAB — CBC WITH DIFFERENTIAL (CANCER CENTER ONLY)
Abs Immature Granulocytes: 0.01 10*3/uL (ref 0.00–0.07)
Basophils Absolute: 0 10*3/uL (ref 0.0–0.1)
Basophils Relative: 0 %
Eosinophils Absolute: 0.1 10*3/uL (ref 0.0–0.5)
Eosinophils Relative: 2 %
HCT: 44.9 % (ref 36.0–46.0)
Hemoglobin: 15.2 g/dL — ABNORMAL HIGH (ref 12.0–15.0)
Immature Granulocytes: 0 %
Lymphocytes Relative: 16 %
Lymphs Abs: 0.5 10*3/uL — ABNORMAL LOW (ref 0.7–4.0)
MCH: 35.7 pg — ABNORMAL HIGH (ref 26.0–34.0)
MCHC: 33.9 g/dL (ref 30.0–36.0)
MCV: 105.4 fL — ABNORMAL HIGH (ref 80.0–100.0)
Monocytes Absolute: 0.4 10*3/uL (ref 0.1–1.0)
Monocytes Relative: 14 %
Neutro Abs: 2 10*3/uL (ref 1.7–7.7)
Neutrophils Relative %: 68 %
Platelet Count: 72 10*3/uL — ABNORMAL LOW (ref 150–400)
RBC: 4.26 MIL/uL (ref 3.87–5.11)
RDW: 14.1 % (ref 11.5–15.5)
WBC Count: 2.9 10*3/uL — ABNORMAL LOW (ref 4.0–10.5)
nRBC: 0 % (ref 0.0–0.2)

## 2022-10-31 LAB — CMP (CANCER CENTER ONLY)
ALT: 77 U/L — ABNORMAL HIGH (ref 0–44)
AST: 75 U/L — ABNORMAL HIGH (ref 15–41)
Albumin: 3.7 g/dL (ref 3.5–5.0)
Alkaline Phosphatase: 160 U/L — ABNORMAL HIGH (ref 38–126)
Anion gap: 8 (ref 5–15)
BUN: 16 mg/dL (ref 8–23)
CO2: 27 mmol/L (ref 22–32)
Calcium: 9.7 mg/dL (ref 8.9–10.3)
Chloride: 110 mmol/L (ref 98–111)
Creatinine: 0.52 mg/dL (ref 0.44–1.00)
GFR, Estimated: >60 mL/min (ref >60)
Glucose, Bld: 112 mg/dL — ABNORMAL HIGH (ref 70–99)
Potassium: 3.7 mmol/L (ref 3.5–5.1)
Sodium: 145 mmol/L (ref 135–145)
Total Bilirubin: 0.5 mg/dL (ref 0.3–1.2)
Total Protein: 6.4 g/dL — ABNORMAL LOW (ref 6.5–8.1)

## 2022-10-31 LAB — TOTAL PROTEIN, URINE DIPSTICK: Protein, ur: 100 mg/dL — AB

## 2022-10-31 MED ORDER — SODIUM CHLORIDE 0.9 % IV SOLN
600.0000 mg | Freq: Once | INTRAVENOUS | Status: AC
  Administered 2022-10-31: 600 mg via INTRAVENOUS
  Filled 2022-10-31: qty 16

## 2022-10-31 MED ORDER — SODIUM CHLORIDE 0.9 % IV SOLN: Freq: Once | INTRAVENOUS | Status: AC

## 2022-10-31 NOTE — Patient Instructions (Signed)
[facility]COL[facility]  Discharge Instructions: Thank you for choosing [facility] to provide your oncology and hematology care.   If you have a lab appointment with the Cancer Center, please go directly to the Cancer Center and check in at the registration area.   Wear comfortable clothing and clothing appropriate for easy access to any Portacath or PICC line.   We strive to give you quality time with your provider. You may need to reschedule your appointment if you arrive late (15 or more minutes).  Arriving late affects you and other patients whose appointments are after yours.  Also, if you miss three or more appointments without notifying the office, you may be dismissed from the clinic at the provider's discretion.      For prescription refill requests, have your pharmacy contact our office and allow 72 hours for refills to be completed.    Today you received the following chemotherapy and/or immunotherapy agents: Bevacizumab.       To help prevent nausea and vomiting after your treatment, we encourage you to take your nausea medication as directed.  BELOW ARE SYMPTOMS THAT SHOULD BE REPORTED IMMEDIATELY: *FEVER GREATER THAN 100.4 F (38 C) OR HIGHER *CHILLS OR SWEATING *NAUSEA AND VOMITING THAT IS NOT CONTROLLED WITH YOUR NAUSEA MEDICATION *UNUSUAL SHORTNESS OF BREATH *UNUSUAL BRUISING OR BLEEDING *URINARY PROBLEMS (pain or burning when urinating, or frequent urination) *BOWEL PROBLEMS (unusual diarrhea, constipation, pain near the anus) TENDERNESS IN MOUTH AND THROAT WITH OR WITHOUT PRESENCE OF ULCERS (sore throat, sores in mouth, or a toothache) UNUSUAL RASH, SWELLING OR PAIN  UNUSUAL VAGINAL DISCHARGE OR ITCHING   Items with * indicate a potential emergency and should be followed up as soon as possible or go to the Emergency Department if any problems should occur.  Please show the CHEMOTHERAPY ALERT CARD or IMMUNOTHERAPY ALERT CARD at check-in  to the Emergency Department and triage nurse.  Should you have questions after your visit or need to cancel or reschedule your appointment, please contact [facility]  Dept: [phone]  and follow the prompts.  Office hours are 8:00 a.m. to 4:30 p.m. Monday - Friday. Please note that voicemails left after 4:00 p.m. may not be returned until the following business day.  We are closed weekends and major holidays. You have access to a nurse at all times for urgent questions. Please call the main number to the clinic Dept: [phone] and follow the prompts.   For any non-urgent questions, you may also contact your provider using MyChart. We now offer e-Visits for anyone 18 and older to request care online for non-urgent symptoms. For details visit [email or web address].   Also download the MyChart app! Go to the app store, search "MyChart", open the app, select [facility], and log in with your MyChart username and password.   

## 2022-10-31 NOTE — Progress Notes (Signed)
[facility] [place], [place] [address] [phone]   Interval Evaluation  Date of Service: 10/31/22 Patient Name: Hannah [name] Patient MRN: [id number] Patient DOB: 05-05-1961 Provider: [name], MD  Identifying Statement:  Hannah [name] is a 61 y.o. female with left frontal glioblastoma   Oncologic History: Oncology History  GBM (glioblastoma multiforme) ([facility])   Initial Diagnosis   GBM (glioblastoma multiforme) ([facility])   02/17/2020 Surgery   Craniotomy, left frontal resection by Dr. Marcello Moores.  Path is glioblastoma.   08/28/2020 - 10/09/2020 Radiation Therapy   Radiation and concurrent Temozolomide 13m/m2 daily   11/10/2020 - 12/09/2020 Chemotherapy   Completes 1 cycle of 5-day Temozolomide      [facility]   [facility], undergoes thrombectomy and IVC filter placement   03/09/2021 Progression     03/27/2021 -  Chemotherapy   Initiates second line therapy with Avastin 163mkg IV q2 weeks    08/08/2021 - 08/09/2021 Chemotherapy   Patient is on Treatment Plan : BRAIN GLIOBLASTOMA Consolidation Temozolomide Days 1-5 q28 Days      12/03/2021 - 05/13/2022 Chemotherapy   Patient is on Treatment Plan : BRAIN Lomustine q42d     08/08/2022 -  Chemotherapy   Patient is on Treatment Plan : BRAIN Lomustine q42d       Biomarkers:  MGMT Methylated.  IDH 1/2 Wild type.  EGFR Unknown  TERT Unknown   Interval History: Hannah [name] presents today for avastin infusion.  No clinical changes described by family today.  Vimpat remains at 5047mwice per day.  Continues to experience difficulty with transition to supported standing, as prior.  Continues reliance on family for functional support.  No headaches or seizures.  Decadron 11/09/20: 2mg18m/10/22: 8mg 84m21/22: 6mg 063m7/22: 4mg 0358m/22: 8mg 04/50m22: 6mg 05/128m2: 3mg 05/2357m: 2mg 06/06/38m 1.5mg 06/20/242m-  H+P  (01/20/20) Patient presented one month ago with sudden onset left arm and leg weakness and interrupted speech, [name], [name], stroke eval and CNS imaging which demonstrated a non-enhancing left frontal mass.  At this time she is back to baseline without recurrence of events, taking Keppra 500mg twice p49may.  No history or seizure or any other neurologic events.  She does describe being sleep deprived prior to seizure event.  She presents today after one month follow up MRI scan.  Medications: Current Outpatient Medications on File Prior to Visit  Medication Sig Dispense Refill   amLODipine (NORVASC) 5 MG tablet Take 1 tablet (5 mg total) by mouth daily. 30 tablet 3   Baclofen 5 MG TABS TAKE 1 TABLET (5 MG) BY MOUTH 3 (THREE) TIMES DAILY. 90 tablet 3   cholecalciferol (VITAMIN D3) 25 MCG (1000 UNIT) tablet Take 1,000 Units by mouth daily.     docusate sodium (COLACE) 100 MG capsule Take 100 mg by mouth 2 (two) times daily as needed for mild constipation.     ELIQUIS 5 MG TABS tablet TAKE 1 TABLET BY MOUTH TWICE A DAY 180 tablet 1   Lacosamide 100 MG TABS Take 1 tablet (100 mg total) by mouth in the morning and at bedtime. 90 tablet 1   [DISCONTINUED] lamoTRIgine (LAMICTAL) 100 MG tablet Take 1 tablet (100 mg total) by mouth daily. 30 tablet 3   No  current facility-administered medications on file prior to visit.    Allergies: No Known Allergies Past Medical History:  Past Medical History:  Diagnosis Date   Cervical spondylolysis 12/15/2019   Skeleton: Mild cervical spondylosis C6-7. Incidental find.    Colon polyp    GBM (glioblastoma multiforme) ([facility])    Left ankle sprain 10/07/2011   Seizure ([facility])    partial    Tick bite 02/2020   Past Surgical History:  Past Surgical History:  Procedure Laterality Date   APPLICATION OF CRANIAL NAVIGATION Left 02/16/2020    Procedure: APPLICATION OF CRANIAL NAVIGATION;  Surgeon: Vallarie Mare, MD;  Location: [facility];  Service: Neurosurgery;  Laterality: Left;   CRANIOTOMY Left 02/16/2020   Procedure: LEFT FRONTAL CRANIOTOMY FOR BRAIN TUMOR;  Surgeon: Vallarie Mare, MD;  Location: [facility];  Service: Neurosurgery;  Laterality: Left;   FOOT SURGERY Left    bone spur - local anesthesia   IR ANGIOGRAM PULMONARY BILATERAL SELECTIVE  12/15/2020   IR ANGIOGRAM SELECTIVE EACH ADDITIONAL VESSEL  12/15/2020   IR ANGIOGRAM SELECTIVE EACH ADDITIONAL VESSEL  12/15/2020   IR IVC FILTER PLMT / S&I /IMG GUID/MOD SED  12/15/2020   IR THROMBECT PRIM MECH INIT (INCLU) MOD SED  12/15/2020   IR THROMBECT PRIM MECH INIT (INCLU) MOD SED  12/15/2020   RADIOLOGY WITH ANESTHESIA N/A 12/15/2020   Procedure: IR WITH ANESTHESIA;  Surgeon: Radiologist, Medication, MD;  Location: [facility];  Service: Radiology;  Laterality: N/A;   TONSILECTOMY/ADENOIDECTOMY WITH MYRINGOTOMY     tonsils and adenoids out only   WISDOM TOOTH EXTRACTION     Social History:  Social History   Socioeconomic History   Marital status: Married    Spouse name: Not on file   Number of children: 3   Years of education: Not on file   Highest education level: Not on file  Occupational History   Not on file  Tobacco Use   Smoking status: Former    Years: 2.00    Types: Cigarettes    Quit date: 11/05/1979    Years since quitting: 43.0   Smokeless tobacco: Never   Tobacco comments:    1/2 pack a week  Vaping Use   Vaping Use: Never used  Substance and Sexual Activity   Alcohol use: Yes    Alcohol/week: 0.0 standard drinks of alcohol    Comment: 1 glass of wine weekly   Drug use: No   Sexual activity: Yes    Birth control/protection: Surgical  Other Topics Concern   Not on file  Social History Narrative   Not on file   Social Determinants of Health   Financial Resource Strain: Low Risk  (07/19/2022)   Overall Financial Resource Strain (CARDIA)    Difficulty  of Paying Living Expenses: Not very hard  Food Insecurity: No Food Insecurity (07/19/2022)   Hunger Vital Sign    Worried About Running Out of Food in the Last Year: Never true    Ran Out of Food in the Last Year: Never true  Transportation Needs: No Transportation Needs (07/19/2022)   PRAPARE - Hydrologist (Medical): No    Lack of Transportation (Non-Medical): No  Physical Activity: Not on file  Stress: Not on file  Social Connections: Not on file  Intimate Partner Violence: Not At Risk (07/19/2022)   Humiliation, Afraid, Rape, and Kick questionnaire    Fear of Current or Ex-Partner: No    Emotionally Abused: No  Physically Abused: No    Sexually Abused: No   Family History:  Family History  Problem Relation Age of Onset   Hypertension Mother    Stroke Mother    Diabetes Mother    Colon cancer Father 54   Hypertension Sister    Colon polyps Sister    Heart disease Brother 98   Diabetes Maternal Uncle    Diabetes Maternal Uncle     Review of Systems: Constitutional: Doesn't report fevers, chills or abnormal weight loss Eyes: Doesn't report blurriness of vision Ears, nose, mouth, throat, and face: Doesn't report sore throat Respiratory: Chest wall pain Cardiovascular: Doesn't report palpitation, chest discomfort  Gastrointestinal:  Doesn't report nausea, constipation, diarrhea GU: Doesn't report incontinence Skin: Doesn't report skin rashes Neurological: Per HPI Musculoskeletal: normal Behavioral/Psych: Doesn't report anxiety  Physical Exam: Vitals:   10/31/22 1105  BP: (!) 152/97  Pulse: 89  Resp: 17  Temp: 97.9 F (36.6 C)  SpO2: 98%   KPS: 60. General: Alert, cooperative, pleasant, in no acute distress Head: Normal EENT: No conjunctival injection or scleral icterus.  Lungs: Resp effort normal Cardiac: Regular rate Abdomen: Non-distended abdomen Skin: Grade 1/2 sacral ulcer Extremities: No clubbing or edema  Neurologic  Exam: Mental Status: Awake, alert, attentive to examiner. Both receptive and expressive dysphasia.  Cranial Nerves: Visual acuity is grossly normal. Visual fields are full. Extra-ocular movements intact. No ptosis. Face is symmetric Motor: Tone and bulk are normal. R arm 1/5. Right leg 3/5, left leg 4/5, proximal greater than distal weakness.  Noted R leg flexor spasticity. Reflexes are symmetric, no pathologic reflexes present.  Sensory: Intact to light touch Gait: Deferred today  Labs: I have reviewed the data as listed    Component Value Date/Time   NA 141 10/10/2022 1040   K 3.8 10/10/2022 1040   CL 109 10/10/2022 1040   CO2 23 10/10/2022 1040   GLUCOSE 107 (H) 10/10/2022 1040   GLUCOSE 85 10/13/2006 1158   BUN 12 10/10/2022 1040   CREATININE 0.52 10/10/2022 1040   CALCIUM 9.7 10/10/2022 1040   PROT 6.4 (L) 10/10/2022 1040   ALBUMIN 3.8 10/10/2022 1040   AST 78 (H) 10/10/2022 1040   ALT 87 (H) 10/10/2022 1040   ALKPHOS 166 (H) 10/10/2022 1040   BILITOT 0.5 10/10/2022 1040   GFRNONAA >60 10/10/2022 1040   GFRAA >60 02/17/2020 0136   Lab Results  Component Value Date   WBC 2.9 (L) 10/31/2022   NEUTROABS 2.0 10/31/2022   HGB 15.2 (H) 10/31/2022   HCT 44.9 10/31/2022   MCV 105.4 (H) 10/31/2022   PLT 72 (L) 10/31/2022   Imaging:  [facility] Clin[facility] Interpretation: I have personally reviewed the CNS images as listed.  My interpretation, in the context of the patient's clinical presentation, is stable disease  No results found.  Assessment/Plan GBM (glioblastoma multiforme) ([facility])  Seizure disorder ([place])  Hannah [name] is clinically stable today, now having completed cycle #5 CCNU/avastin.    MRI brain demonstrates mostly stable findings when compared back to March 2023.  There is increased enhancement in the left periventricular region, but was mildly enhancing on prior studies.  Ok with Avastin 39m/kg IV today.  We will continue to hold the CCNU given ongoing  thrombocytopenia, poor functional status.  Can transition avastin to q4 weeks given quality of life and transport issues.  Chemotherapy should be held for the following:  ANC less than 1,000  Platelets less than 100,000  LFT or creatinine greater than  2x ULN  If clinical concerns/contraindications develop  Avastin should be held for the following:  ANC less than 500  Platelets less than 50,000  LFT or creatinine greater than 2x ULN  If clinical concerns/contraindications develop.  Will con't Norvasc 64m daily.    Vimpat may con't 5646mBID; con't Eliquis as prior.   Baclofen con't 46m63m8 pRN.  We ask that Hannah [name] return to clinic in 4 weeks for avastin.  All questions were answered. The patient knows to call the clinic with any problems, questions or concerns. No barriers to learning were detected.  I have spent a total of 30 minutes of face-to-face and non-face-to-face time, excluding clinical staff time, preparing to see patient, ordering tests and/or medications, counseling the patient, and independently interpreting results and communicating results to the patient/family/caregiver    ZacVentura SellersD Medical Director of Neuro-Oncology [facility]/28/23 11:13 AM

## 2022-10-31 NOTE — Progress Notes (Signed)
Ok to treat with urine prot 100 today per Dr Mickeal Skinner

## 2022-10-31 NOTE — Telephone Encounter (Signed)
72- Palliative Care Note  RN called and talked with spouse and daughter to schedule appt. Scheduled for 11/12/21 at 0900.

## 2022-11-01 ENCOUNTER — Other Ambulatory Visit: Payer: Self-pay

## 2022-11-02 ENCOUNTER — Other Ambulatory Visit: Payer: Self-pay

## 2022-11-07 ENCOUNTER — Other Ambulatory Visit: Payer: Self-pay

## 2022-11-12 ENCOUNTER — Other Ambulatory Visit: Payer: Self-pay

## 2022-11-12 VITALS — BP 138/78 | HR 92 | Temp 97.6°F | Resp 16

## 2022-11-12 DIAGNOSIS — Z515 Encounter for palliative care: Secondary | ICD-10-CM

## 2022-11-12 NOTE — Progress Notes (Signed)
0917 Palliative Encounter Note   PATIENT NAME: Donn Wilmot Hoobler DOB: Aug 23, 1961 MRN: [id number]  PRIMARY CARE PROVIDER: [name], DO  RESPONSIBLE PARTY:  Acct ID - Guarantor Home Phone Work Phone Relationship Acct Type  [id number] KAYLEANN, MCCAFFERY310 [phone]  Self P/F     [address] [place], [place], [place] [address]   RN/SW completed home visit. Husband and daughter also present    HISTORY OF PRESENT ILLNESS:  62 y.o. female with left frontal glioblastoma. Avastin infusion every 4 weeks. MRI- "no active growing". Was taking CCNU every 6 weeks, stopped because of bone marrow suppression. Platelets are low.   CODE STATUS: Pt has living will   ADVANCED DIRECTIVES: N MOST FORM: N PPS:   Socially: Lives in 2 story house with husband and 1 daughter. Oldest son in [place]. Youngest daughter 2 hours away. Stays in living room in lift chair all the time. Has  bath only.   Cognitive: expressive aphasia.    Appetite: great appetite.    Mobility: Not mobile at all. Stiffens up    Sleeping Pattern: sleeping more. Sleeps in lift chair. Husband sleeps on couch.   Pain: Does not appear to be in discomfort. VSS, respirations even and unlabored. Pt smiling at times.    PHYSICAL EXAM:   VITALS: Today's Vitals   11/12/22 1022  BP: 138/78  Pulse: 92  Resp: 16  Temp: 97.6 F (36.4 C)  TempSrc: Tympanic  SpO2: 96%  PainSc: 0-No pain    LUNGS: diminished CARDIAC: regular EXTREMITIES: pt does not move extremities. When she gets worked up, she extends legs rigidly. SKIN: daughter reports that she gets skin breakdown on her buttocks at times.  NEURO: alert. Pt tracks people with their eyes. Smiles in acknowledgement to questions and comments. Can answer yes and no at times but not consistently.   next appt: 12/04/21 1000   Jacqulyn Cane, RN

## 2022-11-14 ENCOUNTER — Other Ambulatory Visit: Payer: Self-pay

## 2022-11-19 ENCOUNTER — Other Ambulatory Visit (HOSPITAL_COMMUNITY): Payer: Self-pay

## 2022-11-28 ENCOUNTER — Other Ambulatory Visit: Payer: Self-pay

## 2022-11-28 ENCOUNTER — Inpatient Hospital Stay: Payer: BC Managed Care – PPO

## 2022-11-28 ENCOUNTER — Inpatient Hospital Stay: Payer: BC Managed Care – PPO | Attending: Internal Medicine

## 2022-11-28 ENCOUNTER — Inpatient Hospital Stay (HOSPITAL_BASED_OUTPATIENT_CLINIC_OR_DEPARTMENT_OTHER): Payer: BC Managed Care – PPO | Admitting: Internal Medicine

## 2022-11-28 VITALS — BP 158/98 | HR 99 | Temp 97.6°F | Resp 18

## 2022-11-28 VITALS — BP 161/103

## 2022-11-28 DIAGNOSIS — Z923 Personal history of irradiation: Secondary | ICD-10-CM | POA: Insufficient documentation

## 2022-11-28 DIAGNOSIS — D696 Thrombocytopenia, unspecified: Secondary | ICD-10-CM | POA: Diagnosis not present

## 2022-11-28 DIAGNOSIS — Z5112 Encounter for antineoplastic immunotherapy: Secondary | ICD-10-CM | POA: Diagnosis present

## 2022-11-28 DIAGNOSIS — C719 Malignant neoplasm of brain, unspecified: Secondary | ICD-10-CM

## 2022-11-28 DIAGNOSIS — Z79630 Long term (current) use of alkylating agent: Secondary | ICD-10-CM | POA: Diagnosis not present

## 2022-11-28 DIAGNOSIS — Z87891 Personal history of nicotine dependence: Secondary | ICD-10-CM | POA: Insufficient documentation

## 2022-11-28 DIAGNOSIS — C711 Malignant neoplasm of frontal lobe: Secondary | ICD-10-CM | POA: Insufficient documentation

## 2022-11-28 DIAGNOSIS — Z7901 Long term (current) use of anticoagulants: Secondary | ICD-10-CM | POA: Insufficient documentation

## 2022-11-28 DIAGNOSIS — R030 Elevated blood-pressure reading, without diagnosis of hypertension: Secondary | ICD-10-CM | POA: Diagnosis not present

## 2022-11-28 DIAGNOSIS — Z79899 Other long term (current) drug therapy: Secondary | ICD-10-CM | POA: Diagnosis not present

## 2022-11-28 DIAGNOSIS — G40909 Epilepsy, unspecified, not intractable, without status epilepticus: Secondary | ICD-10-CM | POA: Diagnosis not present

## 2022-11-28 LAB — CMP (CANCER CENTER ONLY)
ALT: 42 U/L (ref 0–44)
AST: 44 U/L — ABNORMAL HIGH (ref 15–41)
Albumin: 3.7 g/dL (ref 3.5–5.0)
Alkaline Phosphatase: 156 U/L — ABNORMAL HIGH (ref 38–126)
Anion gap: 9 (ref 5–15)
BUN: 12 mg/dL (ref 8–23)
CO2: 25 mmol/L (ref 22–32)
Calcium: 9.5 mg/dL (ref 8.9–10.3)
Chloride: 107 mmol/L (ref 98–111)
Creatinine: 0.65 mg/dL (ref 0.44–1.00)
GFR, Estimated: >60 mL/min (ref >60)
Glucose, Bld: 104 mg/dL — ABNORMAL HIGH (ref 70–99)
Potassium: 3.7 mmol/L (ref 3.5–5.1)
Sodium: 141 mmol/L (ref 135–145)
Total Bilirubin: 0.7 mg/dL (ref 0.3–1.2)
Total Protein: 6.6 g/dL (ref 6.5–8.1)

## 2022-11-28 LAB — CBC WITH DIFFERENTIAL (CANCER CENTER ONLY)
Abs Immature Granulocytes: 0.01 10*3/uL (ref 0.00–0.07)
Basophils Absolute: 0 10*3/uL (ref 0.0–0.1)
Basophils Relative: 0 %
Eosinophils Absolute: 0 10*3/uL (ref 0.0–0.5)
Eosinophils Relative: 1 %
HCT: 48.5 % — ABNORMAL HIGH (ref 36.0–46.0)
Hemoglobin: 16.6 g/dL — ABNORMAL HIGH (ref 12.0–15.0)
Immature Granulocytes: 0 %
Lymphocytes Relative: 22 %
Lymphs Abs: 0.7 10*3/uL (ref 0.7–4.0)
MCH: 35.5 pg — ABNORMAL HIGH (ref 26.0–34.0)
MCHC: 34.2 g/dL (ref 30.0–36.0)
MCV: 103.9 fL — ABNORMAL HIGH (ref 80.0–100.0)
Monocytes Absolute: 0.4 10*3/uL (ref 0.1–1.0)
Monocytes Relative: 13 %
Neutro Abs: 2 10*3/uL (ref 1.7–7.7)
Neutrophils Relative %: 64 %
Platelet Count: 88 10*3/uL — ABNORMAL LOW (ref 150–400)
RBC: 4.67 MIL/uL (ref 3.87–5.11)
RDW: 14 % (ref 11.5–15.5)
WBC Count: 3.2 10*3/uL — ABNORMAL LOW (ref 4.0–10.5)
nRBC: 0 % (ref 0.0–0.2)

## 2022-11-28 LAB — TOTAL PROTEIN, URINE DIPSTICK: Protein, ur: 100 mg/dL — AB

## 2022-11-28 MED ORDER — SODIUM CHLORIDE 0.9 % IV SOLN
600.0000 mg | Freq: Once | INTRAVENOUS | Status: AC
  Administered 2022-11-28: 600 mg via INTRAVENOUS
  Filled 2022-11-28: qty 16

## 2022-11-28 MED ORDER — SODIUM CHLORIDE 0.9 % IV SOLN: Freq: Once | INTRAVENOUS | Status: AC

## 2022-11-28 MED ORDER — AMLODIPINE BESYLATE 10 MG PO TABS: 10.0000 mg | ORAL_TABLET | Freq: Every day | ORAL | 2 refills | Status: AC

## 2022-11-28 NOTE — Progress Notes (Signed)
Ok to treat today with Avastin with UP lab value, blood pressure readings and platelets per Dr Mickeal Skinner.

## 2022-11-28 NOTE — Progress Notes (Signed)
[facility] at [place] [address], [place] [address] [phone]   Interval Evaluation  Date of Service: 11/28/22 Patient Name: Hannah [name] Patient MRN: [id number] Patient DOB: February 20, 1961 Provider: [name], MD  Identifying Statement:  Hannah [name] is a 62 y.o. female with left frontal glioblastoma   Oncologic History: Oncology History  GBM (glioblastoma multiforme) ([place])   Initial Diagnosis   GBM (glioblastoma multiforme) ([place])   02/17/2020 Surgery   Craniotomy, left frontal resection by Dr. Marcello Moores.  Path is glioblastoma.   08/28/2020 - 10/09/2020 Radiation Therapy   Radiation and concurrent Temozolomide '75mg'$ /m2 daily   11/10/2020 - 12/09/2020 Chemotherapy   Completes 1 cycle of 5-day Temozolomide      12/15/2020 [facility] Admission   Admitted for saddle PE, undergoes thrombectomy and IVC filter placement   03/09/2021 Progression     03/27/2021 -  Chemotherapy   Initiates second line therapy with Avastin '10mg'$ /kg IV q2 weeks    08/08/2021 - 08/09/2021 Chemotherapy   Patient is on Treatment Plan : BRAIN GLIOBLASTOMA Consolidation Temozolomide Days 1-5 q28 Days      12/03/2021 - 05/13/2022 Chemotherapy   Patient is on Treatment Plan : BRAIN Lomustine q42d     08/08/2022 -  Chemotherapy   Patient is on Treatment Plan : BRAIN Lomustine q42d       Biomarkers:  MGMT Methylated.  IDH 1/2 Wild type.  EGFR Unknown  TERT Unknown   Interval History: Hannah [name] presents today for avastin infusion.  No new or progressive changes.  Hannah [name] remains at '50mg'$  twice per day.  Continues to experience difficulty with transition to supported standing, as prior.  Continues reliance on family for functional support.  No headaches or seizures.  Decadron 11/09/20: '2mg'$  12/14/20: '8mg'$  12/25/20: '6mg'$  01/08/21: '4mg'$  01/30/21: '8mg'$  02/12/21: '6mg'$  03/15/21: '3mg'$  03/26/21: '2mg'$  04/09/21: 1.'5mg'$  04/23/21: -  H+P (01/20/20) Patient  presented one month ago with sudden onset left arm and leg weakness and interrupted speech, [name], [name], stroke eval and CNS imaging which demonstrated a non-enhancing left frontal mass.  At this time she is back to baseline without recurrence of events, taking Keppra '500mg'$  twice per day.  No history or seizure or any other neurologic events.  She does describe being sleep deprived prior to seizure event.  She presents today after one month follow up MRI scan.  Medications: Current Outpatient Medications on File Prior to Visit  Medication Sig Dispense Refill   amLODipine (NORVASC) 5 MG tablet Take 1 tablet (5 mg total) by mouth daily. 30 tablet 3   Baclofen 5 MG TABS TAKE 1 TABLET (5 MG) BY MOUTH 3 (THREE) TIMES DAILY. 90 tablet 3   cholecalciferol (VITAMIN D3) 25 MCG (1000 UNIT) tablet Take 1,000 Units by mouth daily.     docusate sodium (COLACE) 100 MG capsule Take 100 mg by mouth 2 (two) times daily as needed for mild constipation.     ELIQUIS 5 MG TABS tablet TAKE 1 TABLET BY MOUTH TWICE A DAY 180 tablet 1   Lacosamide 100 MG TABS Take 1 tablet (100 mg total) by mouth in the morning and at bedtime. 90 tablet 1   [DISCONTINUED] lamoTRIgine (LAMICTAL) 100 MG tablet Take 1 tablet (100 mg total) by mouth daily. 30 tablet 3   No current facility-administered  medications on file prior to visit.    Allergies: No Known Allergies Past Medical History:  Past Medical History:  Diagnosis Date   Cervical spondylolysis 12/15/2019   Skeleton: Mild cervical spondylosis C6-7. Incidental find.    Colon polyp    GBM (glioblastoma multiforme) (HCC)    Left ankle sprain 10/07/2011   Seizure ([place])    partial    Tick bite 02/2020   Past Surgical History:  Past Surgical History:  Procedure Laterality Date   APPLICATION OF CRANIAL NAVIGATION Left 02/16/2020   Procedure:  APPLICATION OF CRANIAL NAVIGATION;  Surgeon: Vallarie Mare, MD;  Location: [place];  Service: Neurosurgery;  Laterality: Left;   CRANIOTOMY Left 02/16/2020   Procedure: LEFT FRONTAL CRANIOTOMY FOR BRAIN TUMOR;  Surgeon: Vallarie Mare, MD;  Location: [place];  Service: Neurosurgery;  Laterality: Left;   FOOT SURGERY Left    bone spur - local anesthesia   IR ANGIOGRAM PULMONARY BILATERAL SELECTIVE  12/15/2020   IR ANGIOGRAM SELECTIVE EACH ADDITIONAL VESSEL  12/15/2020   IR ANGIOGRAM SELECTIVE EACH ADDITIONAL VESSEL  12/15/2020   IR IVC FILTER PLMT / S&I /IMG GUID/MOD SED  12/15/2020   IR THROMBECT PRIM MECH INIT (INCLU) MOD SED  12/15/2020   IR THROMBECT PRIM MECH INIT (INCLU) MOD SED  12/15/2020   RADIOLOGY WITH ANESTHESIA N/A 12/15/2020   Procedure: IR WITH ANESTHESIA;  Surgeon: Radiologist, Medication, MD;  Location: [place];  Service: Radiology;  Laterality: N/A;   TONSILECTOMY/ADENOIDECTOMY WITH MYRINGOTOMY     tonsils and adenoids out only   WISDOM TOOTH EXTRACTION     Social History:  Social History   Socioeconomic History   Marital status: Married    Spouse name: Not on file   Number of children: 3   Years of education: Not on file   Highest education level: Not on file  Occupational History   Not on file  Tobacco Use   Smoking status: Former    Years: 2.00    Types: Cigarettes    Quit date: 11/05/1979    Years since quitting: 43.0   Smokeless tobacco: Never   Tobacco comments:    1/2 pack a week  Vaping Use   Vaping Use: Never used  Substance and Sexual Activity   Alcohol use: Yes    Alcohol/week: 0.0 standard drinks of alcohol    Comment: 1 glass of wine weekly   Drug use: No   Sexual activity: Yes    Birth control/protection: Surgical  Other Topics Concern   Not on file  Social History Narrative   Not on file   Social Determinants of Health   Financial Resource Strain: Low Risk  (07/19/2022)   Overall Financial Resource Strain (CARDIA)    Difficulty of Paying  Living Expenses: Not very hard  Food Insecurity: No Food Insecurity (07/19/2022)   Hunger Vital Sign    Worried About Running Out of Food in the Last Year: Never true    Ran Out of Food in the Last Year: Never true  Transportation Needs: No Transportation Needs (07/19/2022)   PRAPARE - Hydrologist (Medical): No    Lack of Transportation (Non-Medical): No  Physical Activity: Not on file  Stress: Not on file  Social Connections: Not on file  Intimate Partner Violence: Not At Risk (07/19/2022)   Humiliation, Afraid, Rape, and Kick questionnaire    Fear of Current or Ex-Partner: No    Emotionally Abused: No    Physically Abused:  No    Sexually Abused: No   Family History:  Family History  Problem Relation Age of Onset   Hypertension Mother    Stroke Mother    Diabetes Mother    Colon cancer Father 29   Hypertension Sister    Colon polyps Sister    Heart disease Brother 33   Diabetes Maternal Uncle    Diabetes Maternal Uncle     Review of Systems: Constitutional: Doesn't report fevers, chills or abnormal weight loss Eyes: Doesn't report blurriness of vision Ears, nose, mouth, throat, and face: Doesn't report sore throat Respiratory: Chest wall pain Cardiovascular: Doesn't report palpitation, chest discomfort  Gastrointestinal:  Doesn't report nausea, constipation, diarrhea GU: Doesn't report incontinence Skin: Doesn't report skin rashes Neurological: Per HPI Musculoskeletal: normal Behavioral/Psych: Doesn't report anxiety  Physical Exam: Vitals:   11/28/22 1026 11/28/22 1043  BP: (!) 167/113 (!) 158/98  Pulse: 99   Resp: 18   Temp: 97.6 F (36.4 C)   SpO2: 96%     KPS: 60. General: Alert, cooperative, pleasant, in no acute distress Head: Normal EENT: No conjunctival injection or scleral icterus.  Lungs: Resp effort normal Cardiac: Regular rate Abdomen: Non-distended abdomen Skin: Grade 1/2 sacral ulcer Extremities: No clubbing or  edema  Neurologic Exam: Mental Status: Awake, alert, attentive to examiner. Both receptive and expressive dysphasia.  Cranial Nerves: Visual acuity is grossly normal. Visual fields are full. Extra-ocular movements intact. No ptosis. Face is symmetric Motor: Tone and bulk are normal. R arm 1/5. Right leg 3/5, left leg 4/5, proximal greater than distal weakness.  Noted R leg flexor spasticity. Reflexes are symmetric, no pathologic reflexes present.  Sensory: Intact to light touch Gait: Deferred today  Labs: I have reviewed the data as listed    Component Value Date/Time   NA 145 10/31/2022 1045   K 3.7 10/31/2022 1045   CL 110 10/31/2022 1045   CO2 27 10/31/2022 1045   GLUCOSE 112 (H) 10/31/2022 1045   GLUCOSE 85 10/13/2006 1158   BUN 16 10/31/2022 1045   CREATININE 0.52 10/31/2022 1045   CALCIUM 9.7 10/31/2022 1045   PROT 6.4 (L) 10/31/2022 1045   ALBUMIN 3.7 10/31/2022 1045   AST 75 (H) 10/31/2022 1045   ALT 77 (H) 10/31/2022 1045   ALKPHOS 160 (H) 10/31/2022 1045   BILITOT 0.5 10/31/2022 1045   GFRNONAA >60 10/31/2022 1045   GFRAA >60 02/17/2020 0136   Lab Results  Component Value Date   WBC 3.2 (L) 11/28/2022   NEUTROABS 2.0 11/28/2022   HGB 16.6 (H) 11/28/2022   HCT 48.5 (H) 11/28/2022   MCV 103.9 (H) 11/28/2022   PLT 88 (L) 11/28/2022   Imaging:  [place] Clinician Interpretation: I have personally reviewed the CNS images as listed.  My interpretation, in the context of the patient's clinical presentation, is stable disease  MR BRAIN W WO CONTRAST  Result Date: 11/02/2022 CLINICAL DATA:  Brain/CNS neoplasm, assess treatment response. Glioblastoma multiforme. EXAM: MRI HEAD WITHOUT AND WITH CONTRAST TECHNIQUE: Multiplanar, multiecho pulse sequences of the brain and surrounding structures were obtained without and with intravenous contrast. CONTRAST:  79m GADAVIST GADOBUTROL 1 MMOL/ML IV SOLN COMPARISON:  05/11/2022, 01/26/2022 FINDINGS: Brain: Expansile and infiltrative  mass at the anterior corpus callosum, asymmetric to the right, with chronic blood products and mineralization. Very little contrast enhancement. The dominant portion of the mass is unchanged. There are 2 enlarging areas of signal abnormality along the posterosuperior margins of the lateral ventricles (series 19 image 102  and 109; series 5 image 30). The right lesion measures 1.4 cm and the left 1.3 cm (previously approximately 8 mm and 4 mm, respectively). There is abnormal diffusion restriction associated with both lesions. Findings of prior left frontal approach biopsy. No acute infarct or acute intracranial hemorrhage. Widespread hyperintense T2-weighted signal within both hemispheres is unchanged. The size and configuration of the ventricles is unchanged. Vascular: Diminutive vertebral and basilar artery flow voids. ICA flow voids are normal. Skull and upper cervical spine: Left frontal burr hole. Normal bone marrow signal. Sinuses/Orbits: Normal Other: None IMPRESSION: 1. Two enlarging areas of signal abnormality and abnormal diffusion restriction along the posterosuperior margins of the lateral ventricles, concerning for progression of disease. 2. Unchanged dominant mass at the anterior corpus callosum. Electronically Signed   By: Ulyses Jarred M.D.   On: 11/02/2022 01:43    Assessment/Plan GBM (glioblastoma multiforme) (HCC)  Seizure disorder ([place])  Hannah [name] is clinically stable today, now having completed cycle #5 CCNU/avastin.    Blood pressure is elevated, rechecked diastolic was 90.  Will recommend increasing amlodipine to '10mg'$  daily, including dosing extra '5mg'$  today.  Ok with Avastin '10mg'$ /kg IV today.  We will continue to hold the CCNU given ongoing thrombocytopenia, poor functional status.  Can transition avastin to q4 weeks given quality of life and transport issues.  Chemotherapy should be held for the following:  ANC less than 1,000  Platelets less than 100,000  LFT or  creatinine greater than 2x ULN  If clinical concerns/contraindications develop  Avastin should be held for the following:  ANC less than 500  Platelets less than 50,000  LFT or creatinine greater than 2x ULN  If clinical concerns/contraindications develop.  Norvasc now '10mg'$  daily.  Hannah [name] may con't '50mg'$  BID; con't Eliquis as prior.   Baclofen con't '5mg'$  q8 pRN.  We ask that Hannah [name] return to clinic in 4 weeks for avastin.  All questions were answered. The patient knows to call the clinic with any problems, questions or concerns. No barriers to learning were detected.  I have spent a total of 30 minutes of face-to-face and non-face-to-face time, excluding clinical staff time, preparing to see patient, ordering tests and/or medications, counseling the patient, and independently interpreting results and communicating results to the patient/family/caregiver    [name], MD Medical Director of Neuro-Oncology [facility] at [place] 11/28/22 10:23 AM

## 2022-11-29 ENCOUNTER — Other Ambulatory Visit: Payer: Self-pay

## 2022-12-01 ENCOUNTER — Other Ambulatory Visit: Payer: Self-pay

## 2022-12-03 ENCOUNTER — Other Ambulatory Visit: Payer: Self-pay

## 2022-12-04 ENCOUNTER — Other Ambulatory Visit: Payer: Self-pay

## 2022-12-12 ENCOUNTER — Other Ambulatory Visit (HOSPITAL_BASED_OUTPATIENT_CLINIC_OR_DEPARTMENT_OTHER): Payer: Self-pay

## 2022-12-23 ENCOUNTER — Other Ambulatory Visit: Payer: Self-pay

## 2022-12-25 ENCOUNTER — Other Ambulatory Visit: Payer: Self-pay

## 2022-12-26 ENCOUNTER — Inpatient Hospital Stay (HOSPITAL_BASED_OUTPATIENT_CLINIC_OR_DEPARTMENT_OTHER): Payer: BC Managed Care – PPO | Admitting: Internal Medicine

## 2022-12-26 ENCOUNTER — Ambulatory Visit: Payer: BC Managed Care – PPO | Admitting: Internal Medicine

## 2022-12-26 ENCOUNTER — Other Ambulatory Visit: Payer: Self-pay

## 2022-12-26 ENCOUNTER — Ambulatory Visit: Payer: BC Managed Care – PPO

## 2022-12-26 ENCOUNTER — Other Ambulatory Visit: Payer: BC Managed Care – PPO

## 2022-12-26 ENCOUNTER — Inpatient Hospital Stay: Payer: BC Managed Care – PPO

## 2022-12-26 ENCOUNTER — Inpatient Hospital Stay: Payer: BC Managed Care – PPO | Attending: Internal Medicine

## 2022-12-26 VITALS — BP 142/99 | HR 130 | Temp 99.0°F | Resp 18

## 2022-12-26 DIAGNOSIS — Z79899 Other long term (current) drug therapy: Secondary | ICD-10-CM | POA: Insufficient documentation

## 2022-12-26 DIAGNOSIS — C719 Malignant neoplasm of brain, unspecified: Secondary | ICD-10-CM | POA: Diagnosis not present

## 2022-12-26 DIAGNOSIS — Z923 Personal history of irradiation: Secondary | ICD-10-CM | POA: Insufficient documentation

## 2022-12-26 DIAGNOSIS — Z800 Family history of malignant neoplasm of digestive organs: Secondary | ICD-10-CM | POA: Diagnosis not present

## 2022-12-26 DIAGNOSIS — Z87891 Personal history of nicotine dependence: Secondary | ICD-10-CM | POA: Insufficient documentation

## 2022-12-26 DIAGNOSIS — Z9221 Personal history of antineoplastic chemotherapy: Secondary | ICD-10-CM | POA: Diagnosis not present

## 2022-12-26 DIAGNOSIS — C711 Malignant neoplasm of frontal lobe: Secondary | ICD-10-CM | POA: Insufficient documentation

## 2022-12-26 DIAGNOSIS — G40909 Epilepsy, unspecified, not intractable, without status epilepticus: Secondary | ICD-10-CM | POA: Insufficient documentation

## 2022-12-26 DIAGNOSIS — Z79630 Long term (current) use of alkylating agent: Secondary | ICD-10-CM | POA: Diagnosis not present

## 2022-12-26 DIAGNOSIS — Z7901 Long term (current) use of anticoagulants: Secondary | ICD-10-CM | POA: Insufficient documentation

## 2022-12-26 DIAGNOSIS — Z5112 Encounter for antineoplastic immunotherapy: Secondary | ICD-10-CM | POA: Insufficient documentation

## 2022-12-26 LAB — CBC WITH DIFFERENTIAL (CANCER CENTER ONLY)
Abs Immature Granulocytes: 0.02 10*3/uL (ref 0.00–0.07)
Basophils Absolute: 0 10*3/uL (ref 0.0–0.1)
Basophils Relative: 0 %
Eosinophils Absolute: 0.1 10*3/uL (ref 0.0–0.5)
Eosinophils Relative: 2 %
HCT: 49.6 % — ABNORMAL HIGH (ref 36.0–46.0)
Hemoglobin: 17.2 g/dL — ABNORMAL HIGH (ref 12.0–15.0)
Immature Granulocytes: 0 %
Lymphocytes Relative: 30 %
Lymphs Abs: 1.5 10*3/uL (ref 0.7–4.0)
MCH: 35.4 pg — ABNORMAL HIGH (ref 26.0–34.0)
MCHC: 34.7 g/dL (ref 30.0–36.0)
MCV: 102.1 fL — ABNORMAL HIGH (ref 80.0–100.0)
Monocytes Absolute: 0.6 10*3/uL (ref 0.1–1.0)
Monocytes Relative: 13 %
Neutro Abs: 2.8 10*3/uL (ref 1.7–7.7)
Neutrophils Relative %: 55 %
Platelet Count: 141 10*3/uL — ABNORMAL LOW (ref 150–400)
RBC: 4.86 MIL/uL (ref 3.87–5.11)
RDW: 13.5 % (ref 11.5–15.5)
WBC Count: 5 10*3/uL (ref 4.0–10.5)
nRBC: 0 % (ref 0.0–0.2)

## 2022-12-26 LAB — CMP (CANCER CENTER ONLY)
ALT: 42 U/L (ref 0–44)
AST: 65 U/L — ABNORMAL HIGH (ref 15–41)
Albumin: 4.2 g/dL (ref 3.5–5.0)
Alkaline Phosphatase: 173 U/L — ABNORMAL HIGH (ref 38–126)
Anion gap: 12 (ref 5–15)
BUN: 13 mg/dL (ref 8–23)
CO2: 23 mmol/L (ref 22–32)
Calcium: 9.6 mg/dL (ref 8.9–10.3)
Chloride: 107 mmol/L (ref 98–111)
Creatinine: 0.62 mg/dL (ref 0.44–1.00)
GFR, Estimated: >60 mL/min (ref >60)
Glucose, Bld: 118 mg/dL — ABNORMAL HIGH (ref 70–99)
Potassium: 3.8 mmol/L (ref 3.5–5.1)
Sodium: 142 mmol/L (ref 135–145)
Total Bilirubin: 0.8 mg/dL (ref 0.3–1.2)
Total Protein: 7.3 g/dL (ref 6.5–8.1)

## 2022-12-26 LAB — TOTAL PROTEIN, URINE DIPSTICK: Protein, ur: 100 mg/dL — AB

## 2022-12-26 MED ORDER — SODIUM CHLORIDE 0.9 % IV SOLN: Freq: Once | INTRAVENOUS | Status: AC

## 2022-12-26 MED ORDER — SODIUM CHLORIDE 0.9 % IV SOLN
600.0000 mg | Freq: Once | INTRAVENOUS | Status: AC
  Administered 2022-12-26: 600 mg via INTRAVENOUS
  Filled 2022-12-26: qty 8

## 2022-12-26 MED ORDER — TRAMADOL HCL 50 MG PO TABS: 50.0000 mg | ORAL_TABLET | Freq: Four times a day (QID) | ORAL | 1 refills | Status: AC | PRN

## 2022-12-26 NOTE — Progress Notes (Signed)
Per Dr. Mickeal Skinner OK to trt today with UP 100 today. Per Dr. Renda Rolls encounter note from 12/26/2022 "she is cleared for avastin infusion."

## 2022-12-26 NOTE — Progress Notes (Signed)
[facility] at [place],  [id number] [phone]   Interval Evaluation  Date of Service: 12/26/22 Patient Name: Hannah [name] Patient MRN: [id number] Patient DOB: 1961-10-05 Provider: [name], MD  Identifying Statement:  Hannah [name] is a 62 y.o. female with left frontal glioblastoma   Oncologic History: Oncology History  GBM (glioblastoma multiforme) ([place])   Initial Diagnosis   GBM (glioblastoma multiforme) ([place])   02/17/2020 Surgery   Craniotomy, left frontal resection by Dr. Marcello Moores.  Path is glioblastoma.   08/28/2020 - 10/09/2020 Radiation Therapy   Radiation and concurrent Temozolomide 40m/m2 daily   11/10/2020 - 12/09/2020 Chemotherapy   Completes 1 cycle of 5-day Temozolomide      [facility]   [facility], undergoes thrombectomy and IVC filter placement   03/09/2021 Progression     03/27/2021 -  Chemotherapy   Initiates second line therapy with Avastin 174mkg IV q2 weeks    08/08/2021 - 08/09/2021 Chemotherapy   Patient is on Treatment Plan : BRAIN GLIOBLASTOMA Consolidation Temozolomide Days 1-5 q28 Days      12/03/2021 - 05/13/2022 Chemotherapy   Patient is on Treatment Plan : BRAIN Lomustine q42d     08/08/2022 -  Chemotherapy   Patient is on Treatment Plan : BRAIN Lomustine q42d       Biomarkers:  MGMT Methylated.  IDH 1/2 Wild type.  EGFR Unknown  TERT Unknown   Interval History: Hannah [name] presents today for avastin infusion.  Husband and sister have noted decline over past month.  She is somewhat less interactive, sleeping more.  There is more crying with conversation, toileting.  Vimpat remains at 5092mwice per day.  Continues to experience difficulty with transition to supported standing, as prior.  Continues reliance on family for functional support.  No headaches or seizures.  Decadron 11/09/20: 2mg71m/10/22: 8mg 3m21/22:  6mg 054m7/22: 4mg 0348m/22: 8mg 04/43m22: 6mg 05/167m2: 3mg 05/2330m: 2mg 06/06/46m 1.5mg 06/20/276m-  H+P (01/20/20) Patient presented one month ago with sudden onset left arm and leg weakness and interrupted speech, [name], [name], stroke eval and CNS imaging which demonstrated a non-enhancing left frontal mass.  At this time she is back to baseline without recurrence of events, taking Keppra 500mg twice p45may.  No history or seizure or any other neurologic events.  She does describe being sleep deprived prior to seizure event.  She presents today after one month follow up MRI scan.  Medications: Current Outpatient Medications on File Prior to Visit  Medication Sig Dispense Refill   amLODipine (NORVASC) 10 MG tablet Take 1 tablet (10 mg total) by mouth daily. 30 tablet 2   Baclofen 5 MG TABS TAKE 1 TABLET (5 MG) BY MOUTH 3 (THREE) TIMES DAILY. 90 tablet 3   cholecalciferol (VITAMIN D3) 25 MCG (1000 UNIT) tablet Take 1,000 Units by mouth daily.     docusate sodium (COLACE) 100 MG capsule Take 100 mg by mouth 2 (two) times daily as needed for mild constipation.     ELIQUIS 5 MG TABS tablet TAKE 1 TABLET BY MOUTH TWICE A DAY 180 tablet 1   Lacosamide 100 MG TABS Take 1 tablet (100 mg total) by mouth in the morning and at bedtime. 90 tablet 1   [DISCONTINUED] lamoTRIgine (LAMICTAL)  100 MG tablet Take 1 tablet (100 mg total) by mouth daily. 30 tablet 3   No current facility-administered medications on file prior to visit.    Allergies: No Known Allergies Past Medical History:  Past Medical History:  Diagnosis Date   Cervical spondylolysis 12/15/2019   Skeleton: Mild cervical spondylosis C6-7. Incidental find.    Colon polyp    GBM (glioblastoma multiforme) (HCC)    Left ankle sprain 10/07/2011   Seizure ([place])    partial    Tick bite 02/2020   Past Surgical  History:  Past Surgical History:  Procedure Laterality Date   APPLICATION OF CRANIAL NAVIGATION Left 02/16/2020   Procedure: APPLICATION OF CRANIAL NAVIGATION;  Surgeon: Vallarie Mare, MD;  Location: [place];  Service: Neurosurgery;  Laterality: Left;   CRANIOTOMY Left 02/16/2020   Procedure: LEFT FRONTAL CRANIOTOMY FOR BRAIN TUMOR;  Surgeon: Vallarie Mare, MD;  Location: [facility];  Service: Neurosurgery;  Laterality: Left;   FOOT SURGERY Left    bone spur - local anesthesia   IR ANGIOGRAM PULMONARY BILATERAL SELECTIVE  12/15/2020   IR ANGIOGRAM SELECTIVE EACH ADDITIONAL VESSEL  12/15/2020   IR ANGIOGRAM SELECTIVE EACH ADDITIONAL VESSEL  12/15/2020   IR IVC FILTER PLMT / S&I /IMG GUID/MOD SED  12/15/2020   IR THROMBECT PRIM MECH INIT (INCLU) MOD SED  12/15/2020   IR THROMBECT PRIM MECH INIT (INCLU) MOD SED  12/15/2020   RADIOLOGY WITH ANESTHESIA N/A 12/15/2020   Procedure: IR WITH ANESTHESIA;  Surgeon: Radiologist, Medication, MD;  Location: [facility] [place];  Service: Radiology;  Laterality: N/A;   TONSILECTOMY/ADENOIDECTOMY WITH MYRINGOTOMY     tonsils and adenoids out only   WISDOM TOOTH EXTRACTION     Social History:  Social History   Socioeconomic History   Marital status: Married    Spouse name: Not on file   Number of children: 3   Years of education: Not on file   Highest education level: Not on file  Occupational History   Not on file  Tobacco Use   Smoking status: Former    Years: 2.00    Types: Cigarettes    Quit date: 11/05/1979    Years since quitting: 43.1   Smokeless tobacco: Never   Tobacco comments:    1/2 pack a week  Vaping Use   Vaping Use: Never used  Substance and Sexual Activity   Alcohol use: Yes    Alcohol/week: 0.0 standard drinks of alcohol    Comment: 1 glass of wine weekly   Drug use: No   Sexual activity: Yes    Birth control/protection: Surgical  Other Topics Concern   Not on file  Social History Narrative   Not on file   Social Determinants of  Health   Financial Resource Strain: Low Risk  (07/19/2022)   Overall Financial Resource Strain (CARDIA)    Difficulty of Paying Living Expenses: Not very hard  Food Insecurity: No Food Insecurity (07/19/2022)   Hunger Vital Sign    Worried About Running Out of Food in the Last Year: Never true    Ran Out of Food in the Last Year: Never true  Transportation Needs: No Transportation Needs (07/19/2022)   PRAPARE - Hydrologist (Medical): No    Lack of Transportation (Non-Medical): No  Physical Activity: Not on file  Stress: Not on file  Social Connections: Not on file  Intimate Partner Violence: Not At Risk (07/19/2022)   Humiliation, Afraid, Rape, and Kick questionnaire  Fear of Current or Ex-Partner: No    Emotionally Abused: No    Physically Abused: No    Sexually Abused: No   Family History:  Family History  Problem Relation Age of Onset   Hypertension Mother    Stroke Mother    Diabetes Mother    Colon cancer Father 28   Hypertension Sister    Colon polyps Sister    Heart disease Brother 4   Diabetes Maternal Uncle    Diabetes Maternal Uncle     Review of Systems: Constitutional: Doesn't report fevers, chills or abnormal weight loss Eyes: Doesn't report blurriness of vision Ears, nose, mouth, throat, and face: Doesn't report sore throat Respiratory: Chest wall pain Cardiovascular: Doesn't report palpitation, chest discomfort  Gastrointestinal:  Doesn't report nausea, constipation, diarrhea GU: Doesn't report incontinence Skin: Doesn't report skin rashes Neurological: Per HPI Musculoskeletal: normal Behavioral/Psych: Doesn't report anxiety  Physical Exam: Vitals:   12/26/22 1356  BP: (!) 142/99  Pulse: (!) 130  Resp: 18  Temp: 99 F (37.2 C)  SpO2: 97%   KPS: 60. General: Alert, cooperative, pleasant, in no acute distress Head: Normal EENT: No conjunctival injection or scleral icterus.  Lungs: Resp effort normal Cardiac:  Regular rate Abdomen: Non-distended abdomen Skin: Grade 1/2 sacral ulcer Extremities: No clubbing or edema  Neurologic Exam: Mental Status: Awake, alert, attentive to examiner. Both receptive and expressive dysphasia.  Cranial Nerves: Visual acuity is grossly normal. Visual fields are full. Extra-ocular movements intact. No ptosis. Face is symmetric Motor: Tone and bulk are normal. R arm 1/5. Right leg 3/5, left leg 4/5, proximal greater than distal weakness.  Noted R leg flexor spasticity. Reflexes are symmetric, no pathologic reflexes present.  Sensory: Intact to light touch Gait: Deferred today  Labs: I have reviewed the data as listed    Component Value Date/Time   NA 141 11/28/2022 1002   K 3.7 11/28/2022 1002   CL 107 11/28/2022 1002   CO2 25 11/28/2022 1002   GLUCOSE 104 (H) 11/28/2022 1002   GLUCOSE 85 10/13/2006 1158   BUN 12 11/28/2022 1002   CREATININE 0.65 11/28/2022 1002   CALCIUM 9.5 11/28/2022 1002   PROT 6.6 11/28/2022 1002   ALBUMIN 3.7 11/28/2022 1002   AST 44 (H) 11/28/2022 1002   ALT 42 11/28/2022 1002   ALKPHOS 156 (H) 11/28/2022 1002   BILITOT 0.7 11/28/2022 1002   GFRNONAA >60 11/28/2022 1002   GFRAA >60 02/17/2020 0136   Lab Results  Component Value Date   WBC 5.0 12/26/2022   NEUTROABS 2.8 12/26/2022   HGB 17.2 (H) 12/26/2022   HCT 49.6 (H) 12/26/2022   MCV 102.1 (H) 12/26/2022   PLT 141 (L) 12/26/2022     Assessment/Plan GBM (glioblastoma multiforme) (HCC)  Seizure disorder (HCC)  Hannah [name] is modestly clinically progressive today.  Labs are WNL, she is cleared for avastin infusion.    Today we discussed goals of care at length.  Family would prefer to continue avastin if it is helping prolong her life.  They understand we would need to obtain an MRI scan prior to any future treatments to ensure productive response to avastin.  Ok with Avastin 50m/kg IV today.  We will continue to hold the CCNU given poor functional status.   Con't transition to avastin to q4 weeks given quality of life and transport issues.  Chemotherapy should be held for the following:  ANC less than 1,000  Platelets less than 100,000  LFT or creatinine  greater than 2x ULN  If clinical concerns/contraindications develop  Avastin should be held for the following:  ANC less than 500  Platelets less than 50,000  LFT or creatinine greater than 2x ULN  If clinical concerns/contraindications develop.  Norvasc now 6m daily.  Vimpat may con't 544mBID; con't Eliquis as prior.   Prescribed 5053mRN Tramadol for pain associated with transitioning, toileting.  Baclofen con't 5mg44m pRN.  We ask that Hannah [name] return to clinic in 4 weeks for MRI review, avastin.  All questions were answered. The patient knows to call the clinic with any problems, questions or concerns. No barriers to learning were detected.  I have spent a total of 40 minutes of face-to-face and non-face-to-face time, excluding clinical staff time, preparing to see patient, ordering tests and/or medications, counseling the patient, and independently interpreting results and communicating results to the patient/family/caregiver    ZachVentura Sellers Medical Director of Neuro-Oncology [facility]22/24 1:46 PM

## 2022-12-26 NOTE — Patient Instructions (Signed)
[facility]  Discharge Instructions: Thank you for choosing [facility] to provide your oncology and hematology care.   If you have a lab appointment with the [facility], please go directly to the [facility] and check in at the registration area.   Wear comfortable clothing and clothing appropriate for easy access to any Portacath or PICC line.   We strive to give you quality time with your provider. You may need to reschedule your appointment if you arrive late (15 or more minutes).  Arriving late affects you and other patients whose appointments are after yours.  Also, if you miss three or more appointments without notifying the office, you may be dismissed from the clinic at the provider's discretion.      For prescription refill requests, have your pharmacy contact our office and allow 72 hours for refills to be completed.    Today you received the following chemotherapy and/or immunotherapy agents: MVASI      To help prevent nausea and vomiting after your treatment, we encourage you to take your nausea medication as directed.  BELOW ARE SYMPTOMS THAT SHOULD BE REPORTED IMMEDIATELY: *FEVER GREATER THAN 100.4 F (38 C) OR HIGHER *CHILLS OR SWEATING *NAUSEA AND VOMITING THAT IS NOT CONTROLLED WITH YOUR NAUSEA MEDICATION *UNUSUAL SHORTNESS OF BREATH *UNUSUAL BRUISING OR BLEEDING *URINARY PROBLEMS (pain or burning when urinating, or frequent urination) *BOWEL PROBLEMS (unusual diarrhea, constipation, pain near the anus) TENDERNESS IN MOUTH AND THROAT WITH OR WITHOUT PRESENCE OF ULCERS (sore throat, sores in mouth, or a toothache) UNUSUAL RASH, SWELLING OR PAIN  UNUSUAL VAGINAL DISCHARGE OR ITCHING   Items with * indicate a potential emergency and should be followed up as soon as possible or go to the Emergency Department if any problems should occur.  Please show the CHEMOTHERAPY ALERT CARD or IMMUNOTHERAPY ALERT CARD at check-in  to the Emergency Department and triage nurse.  Should you have questions after your visit or need to cancel or reschedule your appointment, please contact [facility]  Dept: [phone]  and follow the prompts.  Office hours are 8:00 a.m. to 4:30 p.m. Monday - Friday. Please note that voicemails left after 4:00 p.m. may not be returned until the following business day.  We are closed weekends and major holidays. You have access to a nurse at all times for urgent questions. Please call the main number to the clinic Dept: [phone] and follow the prompts.   For any non-urgent questions, you may also contact your provider using MyChart. We now offer e-Visits for anyone 40 and older to request care online for non-urgent symptoms. For details visit [email or web address].   Also download the MyChart app! Go to the app store, search "MyChart", open the app, select [facility], and log in with your MyChart username and password.

## 2022-12-28 ENCOUNTER — Other Ambulatory Visit: Payer: Self-pay

## 2022-12-30 ENCOUNTER — Other Ambulatory Visit: Payer: Self-pay | Admitting: Radiation Therapy

## 2022-12-31 ENCOUNTER — Telehealth: Payer: Self-pay | Admitting: Internal Medicine

## 2022-12-31 NOTE — Telephone Encounter (Signed)
Scheduled per 02/22 los, patient has been called and notified.

## 2023-01-01 ENCOUNTER — Other Ambulatory Visit: Payer: Self-pay

## 2023-01-03 ENCOUNTER — Telehealth: Payer: Self-pay

## 2023-01-03 NOTE — Telephone Encounter (Signed)
I have called pts husband back and advised as indicated. He expressed understanding of this information. No further assistance was needed at this time.

## 2023-01-03 NOTE — Telephone Encounter (Signed)
Pts husband left a message wanting Dr. Mickeal Skinner to know pt woke up with blood in her mouth this morning. He thinks her gums were bleeding. He states the pt is currently on Eliquis '5mg'$  BID and wants to know if she can decrease this to QD or if the blood in her mouth is concerning?

## 2023-01-05 ENCOUNTER — Other Ambulatory Visit: Payer: Self-pay

## 2023-01-08 ENCOUNTER — Other Ambulatory Visit: Payer: Self-pay

## 2023-01-14 ENCOUNTER — Other Ambulatory Visit: Payer: Self-pay | Admitting: Internal Medicine

## 2023-01-14 DIAGNOSIS — C719 Malignant neoplasm of brain, unspecified: Secondary | ICD-10-CM

## 2023-01-17 ENCOUNTER — Other Ambulatory Visit: Payer: Self-pay | Admitting: Internal Medicine

## 2023-01-18 ENCOUNTER — Ambulatory Visit (HOSPITAL_COMMUNITY)
Admission: RE | Admit: 2023-01-18 | Discharge: 2023-01-18 | Disposition: A | Discharge status: Home / Self Care | Payer: BC Managed Care – PPO | Source: Ambulatory Visit | Attending: Internal Medicine | Admitting: Internal Medicine

## 2023-01-18 DIAGNOSIS — C719 Malignant neoplasm of brain, unspecified: Secondary | ICD-10-CM | POA: Diagnosis not present

## 2023-01-18 MED ORDER — GADOBUTROL 1 MMOL/ML IV SOLN
6.0000 mL | Freq: Once | INTRAVENOUS | Status: AC | PRN
  Administered 2023-01-18: 6 mL via INTRAVENOUS

## 2023-01-20 ENCOUNTER — Inpatient Hospital Stay: Payer: BC Managed Care – PPO | Attending: Internal Medicine

## 2023-01-20 ENCOUNTER — Encounter: Payer: Self-pay | Admitting: Internal Medicine

## 2023-01-20 DIAGNOSIS — Z9221 Personal history of antineoplastic chemotherapy: Secondary | ICD-10-CM | POA: Insufficient documentation

## 2023-01-20 DIAGNOSIS — G40909 Epilepsy, unspecified, not intractable, without status epilepticus: Secondary | ICD-10-CM | POA: Insufficient documentation

## 2023-01-20 DIAGNOSIS — Z923 Personal history of irradiation: Secondary | ICD-10-CM | POA: Insufficient documentation

## 2023-01-20 DIAGNOSIS — Z7901 Long term (current) use of anticoagulants: Secondary | ICD-10-CM | POA: Insufficient documentation

## 2023-01-20 DIAGNOSIS — Z452 Encounter for adjustment and management of vascular access device: Secondary | ICD-10-CM | POA: Insufficient documentation

## 2023-01-20 DIAGNOSIS — Z87891 Personal history of nicotine dependence: Secondary | ICD-10-CM | POA: Insufficient documentation

## 2023-01-20 DIAGNOSIS — C711 Malignant neoplasm of frontal lobe: Secondary | ICD-10-CM | POA: Insufficient documentation

## 2023-01-20 DIAGNOSIS — Z79630 Long term (current) use of alkylating agent: Secondary | ICD-10-CM | POA: Insufficient documentation

## 2023-01-20 DIAGNOSIS — Z79899 Other long term (current) drug therapy: Secondary | ICD-10-CM | POA: Insufficient documentation

## 2023-01-23 ENCOUNTER — Inpatient Hospital Stay: Payer: BC Managed Care – PPO

## 2023-01-23 ENCOUNTER — Inpatient Hospital Stay: Payer: BC Managed Care – PPO | Admitting: Internal Medicine

## 2023-01-23 ENCOUNTER — Other Ambulatory Visit: Payer: Self-pay

## 2023-01-23 VITALS — BP 134/98 | HR 120 | Temp 98.0°F | Resp 17 | Ht 65.0 in

## 2023-01-23 DIAGNOSIS — G40909 Epilepsy, unspecified, not intractable, without status epilepticus: Secondary | ICD-10-CM

## 2023-01-23 DIAGNOSIS — Z7189 Other specified counseling: Secondary | ICD-10-CM | POA: Diagnosis not present

## 2023-01-23 DIAGNOSIS — C711 Malignant neoplasm of frontal lobe: Secondary | ICD-10-CM | POA: Diagnosis not present

## 2023-01-23 DIAGNOSIS — Z452 Encounter for adjustment and management of vascular access device: Secondary | ICD-10-CM | POA: Diagnosis present

## 2023-01-23 DIAGNOSIS — C719 Malignant neoplasm of brain, unspecified: Secondary | ICD-10-CM | POA: Diagnosis not present

## 2023-01-23 DIAGNOSIS — Z9221 Personal history of antineoplastic chemotherapy: Secondary | ICD-10-CM | POA: Diagnosis not present

## 2023-01-23 DIAGNOSIS — Z923 Personal history of irradiation: Secondary | ICD-10-CM | POA: Diagnosis not present

## 2023-01-23 DIAGNOSIS — Z79630 Long term (current) use of alkylating agent: Secondary | ICD-10-CM | POA: Diagnosis not present

## 2023-01-23 DIAGNOSIS — Z87891 Personal history of nicotine dependence: Secondary | ICD-10-CM | POA: Diagnosis not present

## 2023-01-23 DIAGNOSIS — Z79899 Other long term (current) drug therapy: Secondary | ICD-10-CM | POA: Diagnosis not present

## 2023-01-23 DIAGNOSIS — Z7901 Long term (current) use of anticoagulants: Secondary | ICD-10-CM | POA: Diagnosis not present

## 2023-01-23 LAB — CBC WITH DIFFERENTIAL (CANCER CENTER ONLY)
Abs Immature Granulocytes: 0.01 10*3/uL (ref 0.00–0.07)
Basophils Absolute: 0 10*3/uL (ref 0.0–0.1)
Basophils Relative: 0 %
Eosinophils Absolute: 0.1 10*3/uL (ref 0.0–0.5)
Eosinophils Relative: 1 %
HCT: 48.4 % — ABNORMAL HIGH (ref 36.0–46.0)
Hemoglobin: 16.1 g/dL — ABNORMAL HIGH (ref 12.0–15.0)
Immature Granulocytes: 0 %
Lymphocytes Relative: 30 %
Lymphs Abs: 1.2 10*3/uL (ref 0.7–4.0)
MCH: 34.8 pg — ABNORMAL HIGH (ref 26.0–34.0)
MCHC: 33.3 g/dL (ref 30.0–36.0)
MCV: 104.5 fL — ABNORMAL HIGH (ref 80.0–100.0)
Monocytes Absolute: 0.4 10*3/uL (ref 0.1–1.0)
Monocytes Relative: 10 %
Neutro Abs: 2.3 10*3/uL (ref 1.7–7.7)
Neutrophils Relative %: 59 %
Platelet Count: 119 10*3/uL — ABNORMAL LOW (ref 150–400)
RBC: 4.63 MIL/uL (ref 3.87–5.11)
RDW: 14.1 % (ref 11.5–15.5)
WBC Count: 3.9 10*3/uL — ABNORMAL LOW (ref 4.0–10.5)
nRBC: 0 % (ref 0.0–0.2)

## 2023-01-23 LAB — CMP (CANCER CENTER ONLY)
ALT: 40 U/L (ref 0–44)
AST: 49 U/L — ABNORMAL HIGH (ref 15–41)
Albumin: 3.9 g/dL (ref 3.5–5.0)
Alkaline Phosphatase: 199 U/L — ABNORMAL HIGH (ref 38–126)
Anion gap: 9 (ref 5–15)
BUN: 16 mg/dL (ref 8–23)
CO2: 26 mmol/L (ref 22–32)
Calcium: 9.8 mg/dL (ref 8.9–10.3)
Chloride: 113 mmol/L — ABNORMAL HIGH (ref 98–111)
Creatinine: 0.61 mg/dL (ref 0.44–1.00)
GFR, Estimated: >60 mL/min (ref >60)
Glucose, Bld: 125 mg/dL — ABNORMAL HIGH (ref 70–99)
Potassium: 3.6 mmol/L (ref 3.5–5.1)
Sodium: 148 mmol/L — ABNORMAL HIGH (ref 135–145)
Total Bilirubin: 0.8 mg/dL (ref 0.3–1.2)
Total Protein: 6.7 g/dL (ref 6.5–8.1)

## 2023-01-23 NOTE — Progress Notes (Signed)
[facility] at [address] [place], [place] [address] [phone]   Interval Evaluation  Date of Service: 01/23/23 Patient Name: Hannah [name] Patient MRN: [id number] Patient DOB: 1961-02-03 Provider: [name], MD  Identifying Statement:  Hannah [name] is a 62 y.o. female with left frontal glioblastoma   Oncologic History: Oncology History  GBM (glioblastoma multiforme) ([facility])   Initial Diagnosis   GBM (glioblastoma multiforme) ([facility])   02/17/2020 Surgery   Craniotomy, left frontal resection by Dr. Marcello Moores.  Path is glioblastoma.   08/28/2020 - 10/09/2020 Radiation Therapy   Radiation and concurrent Temozolomide 75mg /m2 daily   11/10/2020 - 12/09/2020 Chemotherapy   Completes 1 cycle of 5-day Temozolomide      12/15/2020 [facility] Admission   Admitted for saddle PE, undergoes thrombectomy and IVC filter placement   03/09/2021 Progression     03/27/2021 -  Chemotherapy   Initiates second line therapy with Avastin 10mg /kg IV q2 weeks    08/08/2021 - 08/09/2021 Chemotherapy   Patient is on Treatment Plan : BRAIN GLIOBLASTOMA Consolidation Temozolomide Days 1-5 q28 Days      12/03/2021 - 05/13/2022 Chemotherapy   Patient is on Treatment Plan : BRAIN [place] q42d     08/08/2022 -  Chemotherapy   Patient is on Treatment Plan : BRAIN [place] q42d     01/23/2023 -  Chemotherapy   Patient is on Treatment Plan : BRAIN GBM Bevacizumab 14d x 6 cycles       Biomarkers:  MGMT Methylated.  IDH 1/2 Wild type.  EGFR Unknown  TERT Unknown   Interval History: Hannah [name] presents today for avastin infusion.  No clinical changes, gums and nose bleedings resolved with briefly holding Eliquis.  No significant issues with the MRI.  Vimpat remains at 50mg  twice per day.  Continues to experience difficulty with transition to supported standing, as prior.  Continues reliance on family for functional support.  No headaches or  seizures.  Decadron 11/09/20: 2mg  12/14/20: 8mg  12/25/20: 6mg  01/08/21: 4mg  01/30/21: 8mg  02/12/21: 6mg  03/15/21: 3mg  03/26/21: 2mg  04/09/21: 1.5mg  04/23/21: -  H+P (01/20/20) Patient presented one month ago with sudden onset left arm and leg weakness and interrupted speech, [name], [name], stroke eval and CNS imaging which demonstrated a non-enhancing left frontal mass.  At this time she is back to baseline without recurrence of events, taking Keppra 500mg  twice per day.  No history or seizure or any other neurologic events.  She does describe being sleep deprived prior to seizure event.  She presents today after one month follow up MRI scan.  Medications: Current Outpatient Medications on File Prior to Visit  Medication Sig Dispense Refill   amLODipine (NORVASC) 10 MG tablet Take 1 tablet (10 mg total) by mouth daily. 30 tablet 2   Baclofen 5 MG TABS TAKE 1 TABLET (5 MG) BY MOUTH 3 (THREE) TIMES DAILY. 90 tablet 3   cholecalciferol (VITAMIN D3) 25 MCG (1000 UNIT) tablet Take 1,000 Units by mouth daily.     docusate sodium (COLACE) 100 MG capsule Take 100 mg by mouth 2 (two) times daily as needed for mild constipation.     ELIQUIS 5 MG TABS tablet TAKE 1 TABLET BY MOUTH TWICE A DAY 180 tablet 1   Lacosamide 100 MG TABS TAKE 1 TABLET (100 MG  TOTAL) BY MOUTH IN THE MORNING AND AT BEDTIME. 90 tablet 1   traMADol (ULTRAM) 50 MG tablet Take 1 tablet (50 mg total) by mouth every 6 (six) hours as needed. 30 tablet 1   [DISCONTINUED] lamoTRIgine (LAMICTAL) 100 MG tablet Take 1 tablet (100 mg total) by mouth daily. 30 tablet 3   No current facility-administered medications on file prior to visit.    Allergies: No Known Allergies Past Medical History:  Past Medical History:  Diagnosis Date   Cervical spondylolysis 12/15/2019   Skeleton: Mild cervical spondylosis C6-7.  Incidental find.    Colon polyp    GBM (glioblastoma multiforme) (HCC)    Left ankle sprain 10/07/2011   Seizure ([place])    partial    Tick bite 02/2020   Past Surgical History:  Past Surgical History:  Procedure Laterality Date   APPLICATION OF CRANIAL NAVIGATION Left 02/16/2020   Procedure: APPLICATION OF CRANIAL NAVIGATION;  Surgeon: Vallarie Mare, MD;  Location: [facility];  Service: Neurosurgery;  Laterality: Left;   CRANIOTOMY Left 02/16/2020   Procedure: LEFT FRONTAL CRANIOTOMY FOR BRAIN TUMOR;  Surgeon: Vallarie Mare, MD;  Location: [facility];  Service: Neurosurgery;  Laterality: Left;   FOOT SURGERY Left    bone spur - local anesthesia   IR ANGIOGRAM PULMONARY BILATERAL SELECTIVE  12/15/2020   IR ANGIOGRAM SELECTIVE EACH ADDITIONAL VESSEL  12/15/2020   IR ANGIOGRAM SELECTIVE EACH ADDITIONAL VESSEL  12/15/2020   IR IVC FILTER PLMT / S&I /IMG GUID/MOD SED  12/15/2020   IR THROMBECT PRIM MECH INIT (INCLU) MOD SED  12/15/2020   IR THROMBECT PRIM MECH INIT (INCLU) MOD SED  12/15/2020   RADIOLOGY WITH ANESTHESIA N/A 12/15/2020   Procedure: IR WITH ANESTHESIA;  Surgeon: Radiologist, Medication, MD;  Location: [facility];  Service: Radiology;  Laterality: N/A;   TONSILECTOMY/ADENOIDECTOMY WITH MYRINGOTOMY     tonsils and adenoids out only   WISDOM TOOTH EXTRACTION     Social History:  Social History   Socioeconomic History   Marital status: Married    Spouse name: Not on file   Number of children: 3   Years of education: Not on file   Highest education level: Not on file  Occupational History   Not on file  Tobacco Use   Smoking status: Former    Years: 2    Types: Cigarettes    Quit date: 11/05/1979    Years since quitting: 43.2   Smokeless tobacco: Never   Tobacco comments:    1/2 pack a week  Vaping Use   Vaping Use: Never used  Substance and Sexual Activity   Alcohol use: Yes    Alcohol/week: 0.0 standard drinks of alcohol    Comment: 1 glass of wine weekly   Drug use: No    Sexual activity: Yes    Birth control/protection: Surgical  Other Topics Concern   Not on file  Social History Narrative   Not on file   Social Determinants of Health   Financial Resource Strain: Low Risk  (07/19/2022)   Overall Financial Resource Strain (CARDIA)    Difficulty of Paying Living Expenses: Not very hard  Food Insecurity: No Food Insecurity (07/19/2022)   Hunger Vital Sign    Worried About Running Out of Food in the Last Year: Never true    Ran Out of Food in the Last Year: Never true  Transportation Needs: No Transportation Needs (07/19/2022)   PRAPARE - Hydrologist (Medical): No  Lack of Transportation (Non-Medical): No  Physical Activity: Not on file  Stress: Not on file  Social Connections: Not on file  Intimate Partner Violence: Not At Risk (07/19/2022)   Humiliation, Afraid, Rape, and Kick questionnaire    Fear of Current or Ex-Partner: No    Emotionally Abused: No    Physically Abused: No    Sexually Abused: No   Family History:  Family History  Problem Relation Age of Onset   Hypertension Mother    Stroke Mother    Diabetes Mother    Colon cancer Father 20   Hypertension Sister    Colon polyps Sister    Heart disease Brother 69   Diabetes Maternal Uncle    Diabetes Maternal Uncle     Review of Systems: Constitutional: Doesn't report fevers, chills or abnormal weight loss Eyes: Doesn't report blurriness of vision Ears, nose, mouth, throat, and face: Doesn't report sore throat Respiratory: Chest wall pain Cardiovascular: Doesn't report palpitation, chest discomfort  Gastrointestinal:  Doesn't report nausea, constipation, diarrhea GU: Doesn't report incontinence Skin: Doesn't report skin rashes Neurological: Per HPI Musculoskeletal: normal Behavioral/Psych: Doesn't report anxiety  Physical Exam: There were no vitals filed for this visit.  KPS: 60. General: Alert, cooperative, pleasant, in no acute  distress Head: Normal EENT: No conjunctival injection or scleral icterus.  Lungs: Resp effort normal Cardiac: Regular rate Abdomen: Non-distended abdomen Skin: Grade 1/2 sacral ulcer Extremities: No clubbing or edema  Neurologic Exam: Mental Status: Awake, alert, attentive to examiner. Both receptive and expressive dysphasia.  Cranial Nerves: Visual acuity is grossly normal. Visual fields are full. Extra-ocular movements intact. No ptosis. Face is symmetric Motor: Tone and bulk are normal. R arm 1/5. Right leg 3/5, left leg 4/5, proximal greater than distal weakness.  Noted R leg flexor spasticity. Reflexes are symmetric, no pathologic reflexes present.  Sensory: Intact to light touch Gait: Deferred today  Labs: I have reviewed the data as listed    Component Value Date/Time   NA 142 12/26/2022 1326   K 3.8 12/26/2022 1326   CL 107 12/26/2022 1326   CO2 23 12/26/2022 1326   GLUCOSE 118 (H) 12/26/2022 1326   GLUCOSE 85 10/13/2006 1158   BUN 13 12/26/2022 1326   CREATININE 0.62 12/26/2022 1326   CALCIUM 9.6 12/26/2022 1326   PROT 7.3 12/26/2022 1326   ALBUMIN 4.2 12/26/2022 1326   AST 65 (H) 12/26/2022 1326   ALT 42 12/26/2022 1326   ALKPHOS 173 (H) 12/26/2022 1326   BILITOT 0.8 12/26/2022 1326   GFRNONAA >60 12/26/2022 1326   GFRAA >60 02/17/2020 0136   Lab Results  Component Value Date   WBC 5.0 12/26/2022   NEUTROABS 2.8 12/26/2022   HGB 17.2 (H) 12/26/2022   HCT 49.6 (H) 12/26/2022   MCV 102.1 (H) 12/26/2022   PLT 141 (L) 12/26/2022    Imaging:  [facility] Clinician Interpretation: I have personally reviewed the CNS images as listed.  My interpretation, in the context of the patient's clinical presentation, is treatment effect vs true progression  MR BRAIN W WO CONTRAST  Result Date: 01/20/2023 CLINICAL DATA:  Brain/CNS neoplasm, assess treatment response. Glioblastoma. EXAM: MRI HEAD WITHOUT AND WITH CONTRAST TECHNIQUE: Multiplanar, multiecho pulse sequences of the  brain and surrounding structures were obtained without and with intravenous contrast. CONTRAST:  4mL GADAVIST GADOBUTROL 1 MMOL/ML IV SOLN COMPARISON:  MRI brain 10/30/2022 and older. FINDINGS: Brain: New small area of decreased diffusivity in the left corona radiata (image 32 series 5) with  associated T2 hyperintensity (image 16 series 8). Similar appearance of the previously questioned enlarging areas of decreased diffusivity and T2 hyperintensity along the posterior bodies of the lateral ventricles. Unchanged areas of masslike T2 hyperintensity in the genu and body of the corpus callosum, the latter of which measures up to 27 x 13 mm (coronal image 16 series 15). Unchanged peripheral intrinsic T1 hyperintensity of these lesions without significant enhancement. Stable extensive, confluent T2 hyperintensity in the cerebral white matter. No acute intracranial hemorrhage. No midline shift. No hydrocephalus or extra-axial collection. Vascular: Normal flow voids and enhancement. Skull and upper cervical spine: Prior left vertex craniotomy. Otherwise normal marrow signal and enhancement. Sinuses/Orbits: Unremarkable. Other: None. IMPRESSION: 1. New small area of decreased diffusivity in the left corona radiata with associated T2 hyperintensity, indeterminate but favored to reflect necrosis. 2. Similar appearance of the previously questioned enlarging areas of decreased diffusivity and T2 hyperintensity along the posterior bodies of the lateral ventricles, also indeterminate and favored to reflect necrosis. 3. Unchanged areas of masslike T2 hyperintensity in the genu and body of the corpus callosum. 4. Stable extensive, confluent T2 hyperintensity in the cerebral white matter. Electronically Signed   By: Emmit Alexanders M.D.   On: 01/20/2023 12:23   Assessment/Plan Seizure disorder (HCC)  GBM (glioblastoma multiforme) ([facility]) - Plan: Clinic Appointment Request  Goals of care, counseling/discussion  Hannah [name]  is clinically stable today.  MRI demonstrates increase in DWI signal burden which may be secondary to avastin treatment effect.    Today we again discussed goals of care at length.  Family would like to withhold further infusions.  We are agreeable with this.  Norvasc may decrease to 5mg  daily.  Vimpat may con't 50mg  BID; con't Eliquis as prior.   Prescribed 50mg  PRN Tramadol for pain associated with transitioning, toileting.  Baclofen con't 5mg  q8 pRN.  We ask that Hannah [name] return to clinic based on family preference, for clinical follow up or additional MRI surveillance.  All questions were answered. The patient knows to call the clinic with any problems, questions or concerns. No barriers to learning were detected.  I have spent a total of 40 minutes of face-to-face and non-face-to-face time, excluding clinical staff time, preparing to see patient, ordering tests and/or medications, counseling the patient, and independently interpreting results and communicating results to the patient/family/caregiver    [name], MD Medical Director of Neuro-Oncology [facility] at [facility] 01/23/23 9:54 AM

## 2023-02-05 ENCOUNTER — Other Ambulatory Visit: Payer: Self-pay | Admitting: Internal Medicine

## 2023-02-06 ENCOUNTER — Encounter: Payer: Self-pay | Admitting: Internal Medicine

## 2023-02-14 ENCOUNTER — Other Ambulatory Visit: Payer: Self-pay | Admitting: Hematology and Oncology

## 2023-02-17 ENCOUNTER — Other Ambulatory Visit: Payer: Self-pay | Admitting: Internal Medicine

## 2023-02-20 ENCOUNTER — Other Ambulatory Visit: Payer: Self-pay | Admitting: Internal Medicine

## 2023-02-20 DIAGNOSIS — C719 Malignant neoplasm of brain, unspecified: Secondary | ICD-10-CM

## 2023-03-05 ENCOUNTER — Other Ambulatory Visit: Payer: Self-pay | Admitting: Internal Medicine

## 2023-03-07 ENCOUNTER — Encounter: Payer: Self-pay | Admitting: Internal Medicine

## 2023-03-21 ENCOUNTER — Other Ambulatory Visit: Payer: Self-pay | Admitting: *Deleted

## 2023-03-21 ENCOUNTER — Telehealth: Payer: Self-pay | Admitting: *Deleted

## 2023-03-21 DIAGNOSIS — C719 Malignant neoplasm of brain, unspecified: Secondary | ICD-10-CM

## 2023-03-21 DIAGNOSIS — G40909 Epilepsy, unspecified, not intractable, without status epilepticus: Secondary | ICD-10-CM

## 2023-03-21 MED ORDER — BACLOFEN 5 MG PO TABS: 5.0000 mg | ORAL_TABLET | Freq: Three times a day (TID) | ORAL | 1 refills | Status: DC

## 2023-03-21 NOTE — Telephone Encounter (Signed)
Patient's spouse called for refill on Baclofen but says she takes 5 mg tablets not 10 mg - last tabs were 10 mg and he said they were not easy to break. Dr. Barbaraann Cao informed  Per Dr. Barbaraann Cao - refill Baclofen as 5mg  tablets TID PRN per patient request. Contacted Mr. Bluestein with this information - he verbalized understanding and expressed thanks for change

## 2023-04-10 ENCOUNTER — Other Ambulatory Visit: Payer: Self-pay | Admitting: *Deleted

## 2023-04-10 ENCOUNTER — Telehealth: Payer: Self-pay | Admitting: *Deleted

## 2023-04-10 DIAGNOSIS — C719 Malignant neoplasm of brain, unspecified: Secondary | ICD-10-CM

## 2023-04-10 NOTE — Telephone Encounter (Signed)
PC to [facility] with Marcell Anger, informed her of referral to hospice for this patient.  Order placed, she will be contacting the patient's husband.

## 2023-04-10 NOTE — Telephone Encounter (Signed)
Received vm message from pt's husband asking for a Hospice referral. He is aware that she has not been seen here since 01/23/23  Please advise

## 2023-05-01 ENCOUNTER — Telehealth: Payer: Self-pay | Admitting: *Deleted

## 2023-05-01 NOTE — Telephone Encounter (Signed)
Returned PC to patient's husband, Mellody Dance - he called asking if it is ok for patient to continue taking Eliquis, states the Hospice MD had mentioned patient may need to change to aspirin.  Per Dr Barbaraann Cao, patient may continue taking eliquis.  Patient's husband verbalizes understanding.

## 2023-05-05 ENCOUNTER — Telehealth: Payer: Self-pay | Admitting: *Deleted

## 2023-05-05 NOTE — Telephone Encounter (Signed)
Spouse called asking are you ok with patient taking Aspirin 81 mg instead of Eliquis? Eliquis has ran out and hospice wont pay for it. Mellody Dance just needs to know if the Aspirin will be enough.   Routed via secure chat to Dr Barbaraann Cao.   absolutely ok   Spouse understands.  Reports patient is having a harder time lately swallowing and she mainly is consuming liquids only at this point.

## 2023-05-29 ENCOUNTER — Other Ambulatory Visit: Payer: Self-pay | Admitting: Internal Medicine

## 2023-05-29 DIAGNOSIS — G40909 Epilepsy, unspecified, not intractable, without status epilepticus: Secondary | ICD-10-CM

## 2023-05-29 DIAGNOSIS — C719 Malignant neoplasm of brain, unspecified: Secondary | ICD-10-CM

## 2023-06-05 ENCOUNTER — Telehealth: Payer: Self-pay

## 2023-06-05 NOTE — Telephone Encounter (Signed)
Spoke with Hannah [name] about requested APS, informed him we needed an updated ROI. Per spouse requested emailed copy to him to send back to [place]

## 2023-06-10 ENCOUNTER — Other Ambulatory Visit: Payer: Self-pay | Admitting: Internal Medicine

## 2023-06-16 ENCOUNTER — Telehealth: Payer: Self-pay

## 2023-06-16 NOTE — Telephone Encounter (Signed)
Spoke to spouse informing him that their [employer] disability documents had been completed and faxed to company. Fax conformation received copy of documents mailed to patient per request.

## 2023-07-01 ENCOUNTER — Telehealth: Payer: Self-pay | Admitting: *Deleted

## 2023-07-01 NOTE — Telephone Encounter (Signed)
Hospice certification and plan of care signed by Dr Barbaraann Cao & faxed to [facility].  Fax confirmation received.

## 2023-07-31 ENCOUNTER — Telehealth: Payer: Self-pay | Admitting: *Deleted

## 2023-07-31 NOTE — Telephone Encounter (Signed)
Hospice physician orders signed by Dr Barbaraann Cao & faxed to [facility].  Fax confirmation received.

## 2023-08-19 ENCOUNTER — Encounter: Payer: Self-pay | Admitting: Internal Medicine

## 2023-08-19 NOTE — Telephone Encounter (Signed)
TC

## 2023-08-22 ENCOUNTER — Other Ambulatory Visit: Payer: Self-pay | Admitting: Internal Medicine

## 2023-08-22 DIAGNOSIS — G40909 Epilepsy, unspecified, not intractable, without status epilepticus: Secondary | ICD-10-CM

## 2023-08-22 DIAGNOSIS — C719 Malignant neoplasm of brain, unspecified: Secondary | ICD-10-CM

## 2023-09-18 ENCOUNTER — Telehealth: Payer: Self-pay | Admitting: *Deleted

## 2023-09-18 NOTE — Telephone Encounter (Signed)
Hospice Physician order signed by Dr Barbaraann Cao & returned to [facility], fax #[phone].  Fax confirmation received.

## 2023-10-09 ENCOUNTER — Telehealth: Payer: Self-pay | Admitting: *Deleted

## 2023-10-09 NOTE — Telephone Encounter (Signed)
Physician orders for [facility] signed by Dr Barbaraann Cao & returned by fax, [phone].  Fax confirmation received.

## 2023-11-20 ENCOUNTER — Other Ambulatory Visit: Payer: Self-pay | Admitting: Internal Medicine

## 2023-11-20 DIAGNOSIS — C719 Malignant neoplasm of brain, unspecified: Secondary | ICD-10-CM

## 2023-11-20 DIAGNOSIS — G40909 Epilepsy, unspecified, not intractable, without status epilepticus: Secondary | ICD-10-CM

## 2023-11-21 ENCOUNTER — Other Ambulatory Visit: Payer: Self-pay

## 2023-11-21 DIAGNOSIS — C719 Malignant neoplasm of brain, unspecified: Secondary | ICD-10-CM

## 2023-11-21 DIAGNOSIS — G40909 Epilepsy, unspecified, not intractable, without status epilepticus: Secondary | ICD-10-CM

## 2023-11-21 MED ORDER — BACLOFEN 5 MG PO TABS: 1.0000 | ORAL_TABLET | Freq: Three times a day (TID) | ORAL | 0 refills | Status: AC

## 2023-11-24 NOTE — Telephone Encounter (Signed)
Duplicate, already responded on earlier date.

## 2024-01-06 ENCOUNTER — Telehealth: Payer: Self-pay | Admitting: *Deleted

## 2024-01-06 NOTE — Telephone Encounter (Signed)
 [facility] orders signed by Dr Barbaraann Cao, faxed to [phone].  Fax confirmation received.

## 2024-01-13 ENCOUNTER — Other Ambulatory Visit: Payer: Self-pay | Admitting: Internal Medicine

## 2024-01-29 ENCOUNTER — Telehealth: Payer: Self-pay | Admitting: *Deleted

## 2024-01-29 NOTE — Telephone Encounter (Signed)
 [facility] orders signed by Dr Barbaraann Cao & faxed to [phone].  Fax confirmation received.

## 2024-05-04 ENCOUNTER — Telehealth: Payer: Self-pay | Admitting: *Deleted

## 2024-05-04 NOTE — Telephone Encounter (Signed)
 Hospice Physician Order signed by Dr Buckley & faxed to [facility], [phone].  Fax confirmation received.

## 2024-05-05 ENCOUNTER — Other Ambulatory Visit: Payer: Self-pay

## 2024-05-11 ENCOUNTER — Telehealth: Payer: Self-pay | Admitting: *Deleted

## 2024-05-11 NOTE — Telephone Encounter (Signed)
 Hospice Physician Order signed by Dr Buckley & faxed to [facility], [phone].  Fax confirmation received.

## 2024-05-15 ENCOUNTER — Other Ambulatory Visit: Payer: Self-pay | Admitting: Internal Medicine

## 2024-05-17 ENCOUNTER — Encounter: Payer: Self-pay | Admitting: Internal Medicine

## 2024-06-08 ENCOUNTER — Telehealth: Payer: Self-pay | Admitting: *Deleted

## 2024-06-08 NOTE — Telephone Encounter (Signed)
 Hospice Recertification received from [facility], signed by Dr Buckley & returned to fax #[phone].  Fax confirmation received.

## 2024-06-11 ENCOUNTER — Other Ambulatory Visit: Payer: Self-pay

## 2024-07-29 ENCOUNTER — Telehealth: Payer: Self-pay | Admitting: *Deleted

## 2024-07-29 NOTE — Telephone Encounter (Signed)
 Physician orders signed by Dr Buckley & returned to [facility], fax #[phone].  Fax confirmation received.

## 2024-08-30 ENCOUNTER — Telehealth: Payer: Self-pay | Admitting: *Deleted

## 2024-08-30 NOTE — Telephone Encounter (Signed)
 Physician orders received from [facility], signed by Dr Buckley, returned to Fax #[phone].  Fax confirmation received.

## 2024-10-12 ENCOUNTER — Telehealth: Payer: Self-pay | Admitting: *Deleted

## 2024-10-12 NOTE — Telephone Encounter (Signed)
 Hospice Physician Order signed by Dr Buckley & faxed to [facility], fax #[phone].  Fax confirmation received.

## 2024-10-18 ENCOUNTER — Telehealth: Payer: Self-pay

## 2024-10-18 NOTE — Telephone Encounter (Signed)
 Received confirmation of successful fax transmission of signed orders.

## 2024-12-07 ENCOUNTER — Telehealth: Payer: Self-pay | Admitting: *Deleted
